# Patient Record
Sex: Female | Born: 1965
Health system: Southern US, Community
[De-identification: ages and names within clinical notes are randomized; demographics above are authoritative.]

## PROBLEM LIST (undated history)

## (undated) DIAGNOSIS — R55 Syncope and collapse: Secondary | ICD-10-CM

## (undated) DIAGNOSIS — M792 Neuralgia and neuritis, unspecified: Secondary | ICD-10-CM

## (undated) DIAGNOSIS — R2689 Other abnormalities of gait and mobility: Secondary | ICD-10-CM

## (undated) DIAGNOSIS — IMO0002 Reserved for concepts with insufficient information to code with codable children: Secondary | ICD-10-CM

## (undated) DIAGNOSIS — I10 Essential (primary) hypertension: Secondary | ICD-10-CM

## (undated) DIAGNOSIS — M1990 Unspecified osteoarthritis, unspecified site: Secondary | ICD-10-CM

## (undated) DIAGNOSIS — H548 Legal blindness, as defined in USA: Secondary | ICD-10-CM

## (undated) DIAGNOSIS — H9209 Otalgia, unspecified ear: Secondary | ICD-10-CM

## (undated) DIAGNOSIS — G360 Neuromyelitis optica [Devic]: Secondary | ICD-10-CM

## (undated) DIAGNOSIS — Z8673 Personal history of transient ischemic attack (TIA), and cerebral infarction without residual deficits: Secondary | ICD-10-CM

## (undated) DIAGNOSIS — F32A Depression, unspecified: Secondary | ICD-10-CM

## (undated) DIAGNOSIS — K219 Gastro-esophageal reflux disease without esophagitis: Secondary | ICD-10-CM

## (undated) DIAGNOSIS — D638 Anemia in other chronic diseases classified elsewhere: Secondary | ICD-10-CM

## (undated) DIAGNOSIS — M329 Systemic lupus erythematosus, unspecified: Secondary | ICD-10-CM

## (undated) DIAGNOSIS — R270 Ataxia, unspecified: Secondary | ICD-10-CM

## (undated) DIAGNOSIS — G894 Chronic pain syndrome: Secondary | ICD-10-CM

## (undated) DIAGNOSIS — F418 Other specified anxiety disorders: Secondary | ICD-10-CM

## (undated) DIAGNOSIS — Z8619 Personal history of other infectious and parasitic diseases: Secondary | ICD-10-CM

## (undated) DIAGNOSIS — Z87442 Personal history of urinary calculi: Secondary | ICD-10-CM

## (undated) DIAGNOSIS — R0602 Shortness of breath: Secondary | ICD-10-CM

## (undated) DIAGNOSIS — R768 Other specified abnormal immunological findings in serum: Secondary | ICD-10-CM

## (undated) DIAGNOSIS — R51 Headache: Secondary | ICD-10-CM

## (undated) DIAGNOSIS — M3219 Other organ or system involvement in systemic lupus erythematosus: Secondary | ICD-10-CM

## (undated) DIAGNOSIS — R0609 Other forms of dyspnea: Secondary | ICD-10-CM

## (undated) DIAGNOSIS — H6980 Other specified disorders of Eustachian tube, unspecified ear: Secondary | ICD-10-CM

## (undated) DIAGNOSIS — J9611 Chronic respiratory failure with hypoxia: Secondary | ICD-10-CM

## (undated) DIAGNOSIS — D6862 Lupus anticoagulant syndrome: Secondary | ICD-10-CM

## (undated) DIAGNOSIS — Z9225 Personal history of immunosupression therapy: Secondary | ICD-10-CM

## (undated) DIAGNOSIS — H547 Unspecified visual loss: Secondary | ICD-10-CM

## (undated) DIAGNOSIS — R42 Dizziness and giddiness: Secondary | ICD-10-CM

## (undated) DIAGNOSIS — F419 Anxiety disorder, unspecified: Secondary | ICD-10-CM

## (undated) DIAGNOSIS — L989 Disorder of the skin and subcutaneous tissue, unspecified: Secondary | ICD-10-CM

## (undated) DIAGNOSIS — L819 Disorder of pigmentation, unspecified: Secondary | ICD-10-CM

## (undated) DIAGNOSIS — I639 Cerebral infarction, unspecified: Secondary | ICD-10-CM

## (undated) DIAGNOSIS — E871 Hypo-osmolality and hyponatremia: Secondary | ICD-10-CM

## (undated) DIAGNOSIS — R531 Weakness: Secondary | ICD-10-CM

## (undated) DIAGNOSIS — R208 Other disturbances of skin sensation: Secondary | ICD-10-CM

## (undated) DIAGNOSIS — G8929 Other chronic pain: Secondary | ICD-10-CM

## (undated) DIAGNOSIS — Z7952 Long term (current) use of systemic steroids: Secondary | ICD-10-CM

## (undated) DIAGNOSIS — F329 Major depressive disorder, single episode, unspecified: Secondary | ICD-10-CM

## (undated) DIAGNOSIS — H6983 Other specified disorders of Eustachian tube, bilateral: Secondary | ICD-10-CM

## (undated) HISTORY — DX: Dizziness and giddiness: R42

## (undated) HISTORY — DX: Legal blindness, as defined in USA: H54.8

## (undated) HISTORY — DX: Weakness: R53.1

## (undated) HISTORY — DX: Chronic respiratory failure with hypoxia: J96.11

## (undated) HISTORY — PX: PORTA CATH INSERTION: CATH118285

## (undated) HISTORY — DX: Personal history of immunosupression therapy: Z92.25

## (undated) HISTORY — DX: Lupus anticoagulant syndrome: D68.62

## (undated) HISTORY — DX: Syncope and collapse: R55

## (undated) HISTORY — DX: Anemia in other chronic diseases classified elsewhere: D63.8

## (undated) HISTORY — DX: Neuralgia and neuritis, unspecified: M79.2

## (undated) HISTORY — PX: TUBAL LIGATION: SHX77

## (undated) HISTORY — DX: Disorder of the skin and subcutaneous tissue, unspecified: L98.9

## (undated) HISTORY — DX: Other organ or system involvement in systemic lupus erythematosus: M32.19

## (undated) HISTORY — DX: Other abnormalities of gait and mobility: R26.89

## (undated) HISTORY — DX: Personal history of transient ischemic attack (TIA), and cerebral infarction without residual deficits: Z86.73

## (undated) HISTORY — PX: BREAST BIOPSY: SHX20

## (undated) HISTORY — DX: Long term (current) use of systemic steroids: Z79.52

## (undated) HISTORY — DX: Other specified disorders of eustachian tube, bilateral: H69.83

## (undated) HISTORY — DX: Neuromyelitis optica (devic): G36.0

## (undated) HISTORY — DX: Other chronic pain: G89.29

## (undated) HISTORY — DX: Disorder of pigmentation, unspecified: L81.9

## (undated) HISTORY — PX: BREAST CYST EXCISION: SHX579

## (undated) HISTORY — DX: Other disturbances of skin sensation: R20.8

## (undated) HISTORY — DX: Systemic lupus erythematosus, unspecified: M32.9

## (undated) HISTORY — DX: Hypo-osmolality and hyponatremia: E87.1

## (undated) HISTORY — DX: Chronic pain syndrome: G89.4

## (undated) HISTORY — DX: Otalgia, unspecified ear: H92.09

## (undated) HISTORY — DX: Ataxia, unspecified: R27.0

## (undated) HISTORY — DX: Personal history of other infectious and parasitic diseases: Z86.19

## (undated) HISTORY — DX: Other specified anxiety disorders: F41.8

## (undated) HISTORY — DX: Anxiety disorder, unspecified: F41.9

## (undated) HISTORY — DX: Other forms of dyspnea: R06.09

## (undated) HISTORY — DX: Other specified disorders of Eustachian tube, unspecified ear: H69.80

---
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
## 1997-12-10 ENCOUNTER — Emergency Department (HOSPITAL_COMMUNITY): Admission: EM | Admit: 1997-12-10 | Discharge: 1997-12-10 | Payer: Self-pay | Admitting: Emergency Medicine

## 2004-12-19 ENCOUNTER — Emergency Department: Payer: Self-pay | Admitting: Emergency Medicine

## 2005-09-25 ENCOUNTER — Emergency Department: Payer: Self-pay | Admitting: Internal Medicine

## 2005-09-25 ENCOUNTER — Other Ambulatory Visit: Payer: Self-pay

## 2006-03-31 ENCOUNTER — Emergency Department: Payer: Self-pay | Admitting: Emergency Medicine

## 2006-09-10 DIAGNOSIS — I639 Cerebral infarction, unspecified: Secondary | ICD-10-CM

## 2006-09-10 HISTORY — DX: Cerebral infarction, unspecified: I63.9

## 2006-12-22 ENCOUNTER — Inpatient Hospital Stay: Payer: Self-pay | Admitting: Internal Medicine

## 2008-04-04 ENCOUNTER — Other Ambulatory Visit: Payer: Self-pay

## 2008-04-04 ENCOUNTER — Emergency Department: Payer: Self-pay | Admitting: Emergency Medicine

## 2008-08-12 ENCOUNTER — Ambulatory Visit: Payer: Self-pay | Admitting: Internal Medicine

## 2008-10-19 ENCOUNTER — Ambulatory Visit: Payer: Self-pay | Admitting: Urology

## 2008-11-09 ENCOUNTER — Ambulatory Visit: Payer: Self-pay | Admitting: Urology

## 2009-03-28 ENCOUNTER — Inpatient Hospital Stay: Payer: Self-pay | Admitting: Internal Medicine

## 2009-11-24 ENCOUNTER — Ambulatory Visit: Payer: Self-pay | Admitting: Gastroenterology

## 2009-12-07 ENCOUNTER — Ambulatory Visit: Payer: Self-pay | Admitting: Internal Medicine

## 2009-12-23 ENCOUNTER — Ambulatory Visit: Payer: Self-pay | Admitting: Internal Medicine

## 2010-07-27 ENCOUNTER — Emergency Department (HOSPITAL_COMMUNITY): Admission: EM | Admit: 2010-07-27 | Discharge: 2010-07-27 | Payer: Self-pay | Admitting: Emergency Medicine

## 2010-08-19 ENCOUNTER — Emergency Department: Payer: Self-pay | Admitting: Emergency Medicine

## 2010-11-21 LAB — URINALYSIS, ROUTINE W REFLEX MICROSCOPIC
Glucose, UA: NEGATIVE mg/dL
Leukocytes, UA: NEGATIVE
Specific Gravity, Urine: 1.005 (ref 1.005–1.030)
pH: 6.5 (ref 5.0–8.0)

## 2010-11-21 LAB — URINE MICROSCOPIC-ADD ON

## 2010-11-21 LAB — URINE CULTURE: Culture: NO GROWTH

## 2011-05-22 ENCOUNTER — Ambulatory Visit: Payer: Self-pay | Admitting: Internal Medicine

## 2012-04-29 ENCOUNTER — Emergency Department (INDEPENDENT_AMBULATORY_CARE_PROVIDER_SITE_OTHER): Payer: Medicare Other

## 2012-04-29 ENCOUNTER — Emergency Department (INDEPENDENT_AMBULATORY_CARE_PROVIDER_SITE_OTHER)
Admission: EM | Admit: 2012-04-29 | Discharge: 2012-04-29 | Disposition: A | Discharge status: Home / Self Care | Payer: Medicare Other | Source: Home / Self Care | Attending: Family Medicine | Admitting: Family Medicine

## 2012-04-29 ENCOUNTER — Encounter (HOSPITAL_COMMUNITY): Payer: Self-pay | Admitting: *Deleted

## 2012-04-29 DIAGNOSIS — A084 Viral intestinal infection, unspecified: Secondary | ICD-10-CM

## 2012-04-29 DIAGNOSIS — A088 Other specified intestinal infections: Secondary | ICD-10-CM

## 2012-04-29 HISTORY — DX: Essential (primary) hypertension: I10

## 2012-04-29 HISTORY — DX: Unspecified visual loss: H54.7

## 2012-04-29 HISTORY — DX: Systemic lupus erythematosus, unspecified: M32.9

## 2012-04-29 HISTORY — DX: Reserved for concepts with insufficient information to code with codable children: IMO0002

## 2012-04-29 LAB — POCT URINALYSIS DIP (DEVICE)
Ketones, ur: NEGATIVE mg/dL
Protein, ur: 30 mg/dL — AB
Specific Gravity, Urine: 1.015 (ref 1.005–1.030)

## 2012-04-29 LAB — POCT I-STAT, CHEM 8
Chloride: 99 mEq/L (ref 96–112)
Glucose, Bld: 88 mg/dL (ref 70–99)
HCT: 37 % (ref 36.0–46.0)
Potassium: 3.5 mEq/L (ref 3.5–5.1)

## 2012-04-29 MED ORDER — SODIUM CHLORIDE 0.9 % IV SOLN
Freq: Once | INTRAVENOUS | Status: AC
  Administered 2012-04-29 (×2): via INTRAVENOUS

## 2012-04-29 MED ORDER — ONDANSETRON HCL 4 MG/2ML IJ SOLN
INTRAMUSCULAR | Status: AC
  Filled 2012-04-29: qty 2

## 2012-04-29 MED ORDER — ONDANSETRON HCL 4 MG PO TABS: 4.0000 mg | ORAL_TABLET | Freq: Four times a day (QID) | ORAL | Status: AC

## 2012-04-29 MED ORDER — ONDANSETRON HCL 4 MG/2ML IJ SOLN
4.0000 mg | Freq: Once | INTRAMUSCULAR | Status: AC
  Administered 2012-04-29: 4 mg via INTRAVENOUS

## 2012-04-29 NOTE — ED Notes (Signed)
Up to BR with help, pt voided in the toilet, states the nausea is gone and she has had no diarrhea since she has been here

## 2012-04-29 NOTE — ED Provider Notes (Signed)
History     CSN: [id number]  Arrival date & time 04/29/12  1114   First MD Initiated Contact with Patient 04/29/12 1125      Chief Complaint  Patient presents with  . Diarrhea    (Consider location/radiation/quality/duration/timing/severity/associated sxs/prior treatment) Patient is a 46 y.o. female presenting with vomiting. The history is provided by the patient.  Emesis  This is a new problem. The current episode started yesterday (onset 2-3 d ago with cough and cold sx, resolved but n/v/d started yest and headache since last eve.). The problem occurs 2 to 4 times per day. The problem has been gradually improving. The emesis has an appearance of stomach contents. Associated symptoms include chills, cough, diarrhea and headaches. Pertinent negatives include no abdominal pain and no fever. Risk factors: no one else ill , no travel.    Past Medical History  Diagnosis Date  . Hypertension   . Blind   . Lupus     History reviewed. No pertinent past surgical history.  Family History  Problem Relation Age of Onset  . Cancer Mother   . Cancer Father     History  Substance Use Topics  . Smoking status: Never Smoker   . Smokeless tobacco: Not on file  . Alcohol Use: No    OB History    Grav Para Term Preterm Abortions TAB SAB Ect Mult Living                  Review of Systems  Constitutional: Positive for chills. Negative for fever.  Respiratory: Positive for cough. Negative for shortness of breath.   Cardiovascular: Negative.   Gastrointestinal: Positive for nausea, vomiting and diarrhea. Negative for abdominal pain.  Genitourinary: Negative.   Neurological: Positive for headaches.    Allergies  Review of patient's allergies indicates no known allergies.  Home Medications   Current Outpatient Rx  Name Route Sig Dispense Refill  . METOPROLOL SUCCINATE ER 25 MG PO TB24 Oral Take 25 mg by mouth daily.    Marland Kitchen ONDANSETRON HCL 4 MG PO TABS Oral Take 1 tablet (4 mg  total) by mouth every 6 (six) hours. As needed for n/v 6 tablet 0  . PREDNISONE 10 MG PO TABS Oral Take 10 mg by mouth daily.      BP 111/51  Pulse 110  Temp 99.5 F (37.5 C) (Oral)  Resp 20  SpO2 100%  Physical Exam  Nursing note and vitals reviewed. Constitutional: She is oriented to person, place, and time. She appears well-developed and well-nourished. She appears distressed.  HENT:  Head: Normocephalic.  Right Ear: External ear normal.  Left Ear: External ear normal.  Mouth/Throat: Mucous membranes are dry.  Eyes: Conjunctivae are normal. Pupils are equal, round, and reactive to light.  Neck: Normal range of motion. Neck supple.  Cardiovascular: Normal heart sounds and intact distal pulses.  Tachycardia present.   Pulmonary/Chest: Breath sounds normal.  Abdominal: Soft. Bowel sounds are normal. She exhibits no mass. There is no tenderness. There is no rebound and no guarding.  Lymphadenopathy:    She has no cervical adenopathy.  Neurological: She is alert and oriented to person, place, and time.  Skin: Skin is warm and dry.    ED Course  Procedures (including critical care time)  Labs Reviewed  POCT I-STAT, CHEM 8 - Abnormal; Notable for the following:    Sodium 134 (*)     Calcium, Ion 1.08 (*)     All other components within normal  limits  POCT URINALYSIS DIP (DEVICE) - Abnormal; Notable for the following:    Hgb urine dipstick TRACE (*)     Protein, ur 30 (*)     Leukocytes, UA TRACE (*)  Biochemical Testing Only. Please order routine urinalysis from main lab if confirmatory testing is needed.   All other components within normal limits   Dg Chest 2 View  04/29/2012  *RADIOLOGY REPORT*  Clinical Data: Cough, fever.  CHEST - 2 VIEW  Comparison: None  Findings: Mild cardiomegaly.  Minimal lingular opacity.  Suspect atelectasis or scarring although early infiltrate cannot be excluded.  Right lung is clear.  Mild peribronchial thickening.  No effusions or acute bony  abnormality.  IMPRESSION: Cardiomegaly, bronchitic changes.  Lingular density, atelectasis or scarring versus early infiltrate.   Original Report Authenticated By: Cyndie Chime, M.D.      1. Gastroenteritis and colitis, viral       MDM  Sx improved after ivf and med.,urinating well., desires d/c        Linna Hoff, MD 04/29/12 [phone]

## 2012-04-29 NOTE — ED Notes (Signed)
Pt c/o frontal h/a .  Notified Dr Artis Flock, he said when she is fully hydrated the pain will subside

## 2012-04-29 NOTE — ED Notes (Signed)
3 days of N, V and mainly D--many times, the last watery BM was 0500.  Also fever and myalgias  She is taking fluids well, but not eating solid food much

## 2012-05-04 ENCOUNTER — Emergency Department (HOSPITAL_COMMUNITY)
Admission: EM | Admit: 2012-05-04 | Discharge: 2012-05-04 | Disposition: A | Discharge status: Home / Self Care | Payer: Medicare Other | Attending: Emergency Medicine | Admitting: Emergency Medicine

## 2012-05-04 ENCOUNTER — Emergency Department (HOSPITAL_COMMUNITY): Payer: Medicare Other

## 2012-05-04 DIAGNOSIS — R197 Diarrhea, unspecified: Secondary | ICD-10-CM | POA: Insufficient documentation

## 2012-05-04 DIAGNOSIS — R1110 Vomiting, unspecified: Secondary | ICD-10-CM

## 2012-05-04 DIAGNOSIS — K8020 Calculus of gallbladder without cholecystitis without obstruction: Secondary | ICD-10-CM | POA: Insufficient documentation

## 2012-05-04 DIAGNOSIS — R5381 Other malaise: Secondary | ICD-10-CM | POA: Insufficient documentation

## 2012-05-04 DIAGNOSIS — R112 Nausea with vomiting, unspecified: Secondary | ICD-10-CM | POA: Insufficient documentation

## 2012-05-04 DIAGNOSIS — R5383 Other fatigue: Secondary | ICD-10-CM

## 2012-05-04 DIAGNOSIS — B349 Viral infection, unspecified: Secondary | ICD-10-CM

## 2012-05-04 DIAGNOSIS — R51 Headache: Secondary | ICD-10-CM | POA: Insufficient documentation

## 2012-05-04 LAB — COMPREHENSIVE METABOLIC PANEL
BUN: 10 mg/dL (ref 6–23)
CO2: 24 mEq/L (ref 19–32)
Chloride: 101 mEq/L (ref 96–112)
Creatinine, Ser: 0.89 mg/dL (ref 0.50–1.10)
GFR calc Af Amer: >90 mL/min (ref >90)
GFR calc non Af Amer: 78 mL/min — ABNORMAL LOW (ref >90)
Glucose, Bld: 78 mg/dL (ref 70–99)
Total Bilirubin: 0.5 mg/dL (ref 0.3–1.2)

## 2012-05-04 LAB — CBC
HCT: 37.3 % (ref 36.0–46.0)
Hemoglobin: 11.8 g/dL — ABNORMAL LOW (ref 12.0–15.0)
MCV: 71.9 fL — ABNORMAL LOW (ref 78.0–100.0)
RBC: 5.19 MIL/uL — ABNORMAL HIGH (ref 3.87–5.11)
RDW: 14.4 % (ref 11.5–15.5)
WBC: 2.8 10*3/uL — ABNORMAL LOW (ref 4.0–10.5)

## 2012-05-04 LAB — URINALYSIS, ROUTINE W REFLEX MICROSCOPIC
Ketones, ur: NEGATIVE mg/dL
Protein, ur: NEGATIVE mg/dL
Urobilinogen, UA: 0.2 mg/dL (ref 0.0–1.0)

## 2012-05-04 LAB — URINE MICROSCOPIC-ADD ON

## 2012-05-04 LAB — PREGNANCY, URINE: Preg Test, Ur: NEGATIVE

## 2012-05-04 MED ORDER — SODIUM CHLORIDE 0.9 % IV BOLUS (SEPSIS)
1000.0000 mL | Freq: Once | INTRAVENOUS | Status: AC
  Administered 2012-05-04: 1000 mL via INTRAVENOUS

## 2012-05-04 MED ORDER — ONDANSETRON HCL 4 MG/2ML IJ SOLN
4.0000 mg | INTRAMUSCULAR | Status: AC
  Administered 2012-05-04: 4 mg via INTRAVENOUS
  Filled 2012-05-04: qty 2

## 2012-05-04 NOTE — ED Notes (Signed)
Pt alert, arrives from home, c/o n/v/d, onset was a week ago, seen in Urgent Care, returns to Ed with cont Complaints, resp even unlabored, skin pwd

## 2012-05-04 NOTE — ED Notes (Signed)
Pt c/o N/V/D and headache.No LOC.VSS.pwd

## 2012-05-04 NOTE — ED Provider Notes (Signed)
History     CSN: [id number]  Arrival date & time 05/04/12  4098   First MD Initiated Contact with Patient 05/04/12 [phone]      Chief Complaint  Patient presents with  . Nausea  . Emesis  . Diarrhea    (Consider location/radiation/quality/duration/timing/severity/associated sxs/prior treatment) HPI Comments: Ms. Spelman presents for evaluation of nausea.  She states she has been ill for 8 days.  Her symptoms have included weakness, nausea, vomiting, diarrhea and fatigue.  She was prescribed zofran, cipro, and flagyl for this condition and states that the diarrhea has improved but she continues to feel ill and thinks that she should be feeling better by now.  She denies any fevers or pain.  Patient is a 46 y.o. female presenting with vomiting and diarrhea. The history is provided by the patient. No language interpreter was used.  Emesis  This is a new problem. The current episode started more than 1 week ago. The problem has been gradually improving. The emesis has an appearance of stomach contents. There has been no fever. Associated symptoms include diarrhea and headaches. Pertinent negatives include no abdominal pain, no arthralgias, no chills, no cough, no fever, no myalgias, no sweats and no URI.  Diarrhea The primary symptoms include fatigue, nausea, vomiting and diarrhea. Primary symptoms do not include fever, abdominal pain, dysuria, myalgias or arthralgias.  The illness does not include chills, constipation or back pain.    Past Medical History  Diagnosis Date  . Hypertension   . Blind   . Lupus     No past surgical history on file.  Family History  Problem Relation Age of Onset  . Cancer Mother   . Cancer Father     History  Substance Use Topics  . Smoking status: Never Smoker   . Smokeless tobacco: Not on file  . Alcohol Use: No    OB History    Grav Para Term Preterm Abortions TAB SAB Ect Mult Living                  Review of Systems  Constitutional:  Positive for activity change, appetite change and fatigue. Negative for fever, chills, diaphoresis and unexpected weight change.  HENT: Negative for congestion, facial swelling, rhinorrhea and voice change.   Eyes: Negative.   Respiratory: Negative for cough and chest tightness.   Cardiovascular: Negative.   Gastrointestinal: Positive for nausea, vomiting and diarrhea. Negative for abdominal pain, constipation, blood in stool, abdominal distention and anal bleeding.  Genitourinary: Negative for dysuria, urgency, flank pain, decreased urine volume and enuresis.  Musculoskeletal: Negative for myalgias, back pain, joint swelling and arthralgias.  Neurological: Positive for weakness and headaches. Negative for dizziness, syncope and light-headedness.  Hematological: Negative.   Psychiatric/Behavioral: Negative.     Allergies  Review of patient's allergies indicates no known allergies.  Home Medications   Current Outpatient Rx  Name Route Sig Dispense Refill  . CIPROFLOXACIN HCL 500 MG PO TABS Oral Take 500 mg by mouth 2 (two) times daily.    Marland Kitchen CITALOPRAM HYDROBROMIDE 40 MG PO TABS Oral Take 40 mg by mouth daily.    . MELOXICAM 7.5 MG PO TABS Oral Take 7.5 mg by mouth daily.    Marland Kitchen METOPROLOL SUCCINATE ER 25 MG PO TB24 Oral Take 25 mg by mouth daily.    Marland Kitchen METRONIDAZOLE 500 MG PO TABS Oral Take 500 mg by mouth 2 (two) times daily.    Marland Kitchen ONDANSETRON HCL 4 MG PO TABS Oral Take  1 tablet (4 mg total) by mouth every 6 (six) hours. As needed for n/v 6 tablet 0  . PREDNISONE 10 MG PO TABS Oral Take 10 mg by mouth daily.    Marland Kitchen ZOLPIDEM TARTRATE 10 MG PO TABS Oral Take 10 mg by mouth at bedtime as needed. sleep      BP 105/50  Pulse 89  Temp 98.6 F (37 C) (Oral)  Resp 16  SpO2 100%  Physical Exam  Nursing note and vitals reviewed. Constitutional: She is oriented to person, place, and time. Vital signs are normal. She appears well-developed and well-nourished.  Non-toxic appearance. She does not  have a sickly appearance. She does not appear ill. No distress. She is not intubated.  HENT:  Head: Normocephalic and atraumatic. Head is without raccoon's eyes and without Battle's sign. No trismus in the jaw.  Right Ear: External ear normal. No tenderness. No mastoid tenderness. Tympanic membrane is not bulging. No hemotympanum.  Left Ear: External ear normal. No tenderness. No mastoid tenderness. Tympanic membrane is not bulging. No hemotympanum.  Nose: Nose normal. No mucosal edema, rhinorrhea or nasal septal hematoma. No epistaxis.  Mouth/Throat: Uvula is midline, oropharynx is clear and moist and mucous membranes are normal. Mucous membranes are not pale, not dry and not cyanotic. No dental abscesses or uvula swelling. No oropharyngeal exudate, posterior oropharyngeal edema, posterior oropharyngeal erythema or tonsillar abscesses.  Eyes: Conjunctivae and EOM are normal. Pupils are equal, round, and reactive to light. Right eye exhibits no discharge. Left eye exhibits no discharge. No scleral icterus.  Neck: Normal range of motion. No JVD present. No tracheal deviation present. No thyromegaly present.  Cardiovascular: Normal rate, regular rhythm, S1 normal, S2 normal, normal heart sounds and intact distal pulses.  Exam reveals no gallop, no distant heart sounds and no friction rub.   No murmur heard. Pulmonary/Chest: Breath sounds normal. No accessory muscle usage or stridor. No apnea, not tachypneic and not bradypneic. She is not intubated. No respiratory distress. She has no wheezes. She has no rales. She exhibits no tenderness and no bony tenderness.  Abdominal: Soft. Bowel sounds are normal. She exhibits no distension and no mass. There is no hepatosplenomegaly. There is no tenderness. There is no rebound, no guarding and no CVA tenderness. No hernia.  Musculoskeletal: Normal range of motion. She exhibits no edema and no tenderness.  Lymphadenopathy:    She has no cervical adenopathy.    Neurological: She is alert and oriented to person, place, and time.  Skin: Skin is warm and dry. No rash noted. She is not diaphoretic. No erythema. No pallor.  Psychiatric: She has a normal mood and affect. Her behavior is normal. Judgment and thought content normal.    ED Course  Procedures (including critical care time)   Labs Reviewed  SPECIMEN HOLD   No results found.   No diagnosis found.    MDM  Pt presents for evaluation of nausea and vomiting.  Pt has been seen at urgent care and by her PMD for this issue and states that the diarrhea has improved but she has residual nausea.  She denies significant pain or fevers.  Plan symptomatic care and screening labs.    1600.  Note mild elevations in AST/ALT.  Obtained RUQ U/S secondary to mild discomfort on repeat exam and her complaints of nausea and vomiting.  There was sludge and small stones as well as some dilatation of the CBD.  No wall thickening or pericholecystic fluid identified.  On  f/u exam pt has no evidence cholecystitis and she had previous imaging that showed CBD dilatation.  Pt has tolerated po fluids and solids.  Plan d/c home.  Encouraged f/u within 3 days with her PMD and also for her to establish care with a local general surgeon.     Tobin Chad, MD 05/04/12 [phone]

## 2012-05-04 NOTE — ED Notes (Signed)
[id number]<AV> Expected date:<BR> Expected time:<BR> Means of arrival:<BR> Comments:<BR> Rm 19, [place] 34, F, N/V/D

## 2012-06-16 ENCOUNTER — Other Ambulatory Visit: Payer: Self-pay | Admitting: Family Medicine

## 2012-06-16 ENCOUNTER — Ambulatory Visit
Admission: RE | Admit: 2012-06-16 | Discharge: 2012-06-16 | Disposition: A | Discharge status: Home / Self Care | Payer: Worker's Compensation | Source: Ambulatory Visit | Attending: Family Medicine | Admitting: Family Medicine

## 2012-06-16 DIAGNOSIS — T1490XA Injury, unspecified, initial encounter: Secondary | ICD-10-CM

## 2012-08-29 ENCOUNTER — Ambulatory Visit: Payer: Self-pay | Admitting: Emergency Medicine

## 2013-01-05 ENCOUNTER — Other Ambulatory Visit: Payer: Self-pay | Admitting: Family Medicine

## 2013-01-05 ENCOUNTER — Other Ambulatory Visit: Payer: Self-pay | Admitting: Registered Nurse

## 2013-01-05 DIAGNOSIS — Z1231 Encounter for screening mammogram for malignant neoplasm of breast: Secondary | ICD-10-CM

## 2013-01-13 ENCOUNTER — Ambulatory Visit: Payer: Worker's Compensation

## 2013-01-30 ENCOUNTER — Ambulatory Visit: Payer: Worker's Compensation

## 2013-01-30 ENCOUNTER — Ambulatory Visit
Admission: RE | Admit: 2013-01-30 | Discharge: 2013-01-30 | Disposition: A | Discharge status: Home / Self Care | Payer: Medicare Other | Source: Ambulatory Visit | Attending: Registered Nurse | Admitting: Registered Nurse

## 2013-01-30 ENCOUNTER — Other Ambulatory Visit: Payer: Self-pay | Admitting: Internal Medicine

## 2013-01-30 DIAGNOSIS — Z1231 Encounter for screening mammogram for malignant neoplasm of breast: Secondary | ICD-10-CM

## 2013-03-04 ENCOUNTER — Emergency Department (HOSPITAL_COMMUNITY)
Admission: EM | Admit: 2013-03-04 | Discharge: 2013-03-04 | Discharge status: Against Medical Advice | Payer: Medicare Other | Attending: Emergency Medicine | Admitting: Emergency Medicine

## 2013-03-04 ENCOUNTER — Encounter (HOSPITAL_COMMUNITY): Payer: Self-pay | Admitting: *Deleted

## 2013-03-04 DIAGNOSIS — I10 Essential (primary) hypertension: Secondary | ICD-10-CM | POA: Insufficient documentation

## 2013-03-04 DIAGNOSIS — J029 Acute pharyngitis, unspecified: Secondary | ICD-10-CM | POA: Insufficient documentation

## 2013-03-04 NOTE — ED Notes (Signed)
Pt called multiple  Times to take back to a room without response

## 2013-03-04 NOTE — ED Notes (Signed)
Pt in stating she feels like she has a lump in her throat, states it has been going on x1 week, symptoms are worse at night, states she wouldn't describe this as pain but if she turned her head to the left side she feels like something is obstructer her airway. Pt is alert and oriented, speaking in full sentences without difficulty.

## 2013-03-20 ENCOUNTER — Encounter: Payer: Self-pay | Admitting: Endocrinology

## 2013-03-20 ENCOUNTER — Ambulatory Visit (INDEPENDENT_AMBULATORY_CARE_PROVIDER_SITE_OTHER): Payer: Medicare Other | Admitting: Endocrinology

## 2013-03-20 VITALS — BP 116/72 | HR 67 | Ht 61.0 in | Wt 164.6 lb

## 2013-03-20 DIAGNOSIS — E042 Nontoxic multinodular goiter: Secondary | ICD-10-CM

## 2013-03-20 DIAGNOSIS — K219 Gastro-esophageal reflux disease without esophagitis: Secondary | ICD-10-CM

## 2013-03-20 HISTORY — DX: Gastro-esophageal reflux disease without esophagitis: K21.9

## 2013-03-20 LAB — T4, FREE: Free T4: 0.66 ng/dL (ref 0.60–1.60)

## 2013-03-20 NOTE — Progress Notes (Signed)
Subjective:     Patient ID: Andrea Berry, female   DOB: [birth year], 47 y.o.   MRN: [id number]  HPI  The patient is referred here for evaluation of her thyroid. She has been complaining of a sensation of lump in her left neck area for the last 3 weeks. This is somewhat on and off and is somewhat uncomfortable. She also feels that when she lies down she feels her air being cut off with a choking sensation. She does not have any difficulty swallowing and the symptoms are not every night, only occasionally will have symptoms during the day. She has not noticed any swelling in her neck. She was sent by her PCP to have an ultrasound of her thyroid which showed a single nodule in each lobe measuring 5 or 6 mm and no overall thyroid enlargement seen last month  Past Medical History  Diagnosis Date  . Hypertension   . Blind   . Lupus     No past surgical history on file.  History   Social History  . Marital Status: Single    Spouse Name: N/A    Number of Children: N/A  . Years of Education: N/A   Occupational History  . Not on file.   Social History Main Topics  . Smoking status: Never Smoker   . Smokeless tobacco: Not on file  . Alcohol Use: No  . Drug Use: No  . Sexually Active:    Other Topics Concern  . Not on file   Social History Narrative  . No narrative on file    Current Outpatient Prescriptions on File Prior to Visit  Medication Sig Dispense Refill  . citalopram (CELEXA) 40 MG tablet Take 40 mg by mouth daily.      . meloxicam (MOBIC) 7.5 MG tablet Take 7.5 mg by mouth daily.      . metoprolol succinate (TOPROL-XL) 25 MG 24 hr tablet Take 25 mg by mouth daily.      . metroNIDAZOLE (FLAGYL) 500 MG tablet Take 500 mg by mouth 2 (two) times daily.      . predniSONE (DELTASONE) 10 MG tablet Take 10 mg by mouth daily.      Marland Kitchen zolpidem (AMBIEN) 10 MG tablet Take 10 mg by mouth at bedtime as needed. sleep      . ciprofloxacin (CIPRO) 500 MG tablet Take 500 mg by mouth 2  (two) times daily.       No current facility-administered medications on file prior to visit.    No Known Allergies  Family History  Problem Relation Age of Onset  . Cancer Mother   . Cancer Father    Review of Systems  Gastrointestinal:       She gets heartburn about 3-4 days a week, has had the symptoms for a long time. She thinks symptoms are controlled with taking Prilosec although she is unclear if she is taking this  she has known lupus and is on prednisone for this, this presented with joint pain No history of hypertension Has history of insomnia History of depression     Objective:   Physical Exam  Constitutional: She appears well-developed and well-nourished.  Neck: No tracheal deviation present. No thyromegaly present.  Small <1 cm lymph node felt in left midcervical area, slightly tender  Cardiovascular: Normal rate and normal heart sounds.   Pulmonary/Chest: Breath sounds normal. Stridor present. She has no wheezes. She has no rales.  Neurological:  Biceps reflexes are brisk. No tremor present  Skin: Skin is warm and dry.       Assessment:     Clinically insignificant subcentimeter thyroid nodules. Unknown level of thyroid function  Nonspecific symptoms of choking in supine position and discomfort in lower neck area, this may be related to excessive reflux Mildly tender lymph node in the left neck area     Plan:     Check thyroid levels No further intervention or evaluation needed for miniscule thyroid nodules which are likely to be incidental  Suggested to her that she can increase her Prilosec to twice a day and followup with PCP for further management as well as followup of small left cervical lymph node     Lab Results  Component Value Date   TSH 3.55 03/20/2013

## 2013-03-20 NOTE — Patient Instructions (Addendum)
Will call you if thyroid test is abnormal   follow up with primary care physician for further treatment of symptoms

## 2013-12-20 ENCOUNTER — Ambulatory Visit (HOSPITAL_COMMUNITY): Payer: Medicare Other | Attending: Emergency Medicine

## 2013-12-20 ENCOUNTER — Encounter (HOSPITAL_COMMUNITY): Payer: Self-pay | Admitting: Emergency Medicine

## 2013-12-20 ENCOUNTER — Emergency Department (INDEPENDENT_AMBULATORY_CARE_PROVIDER_SITE_OTHER)
Admission: EM | Admit: 2013-12-20 | Discharge: 2013-12-20 | Disposition: A | Discharge status: Home / Self Care | Payer: Medicare Other | Source: Home / Self Care | Attending: Family Medicine | Admitting: Family Medicine

## 2013-12-20 DIAGNOSIS — M7989 Other specified soft tissue disorders: Secondary | ICD-10-CM | POA: Insufficient documentation

## 2013-12-20 DIAGNOSIS — W268XXA Contact with other sharp object(s), not elsewhere classified, initial encounter: Secondary | ICD-10-CM

## 2013-12-20 DIAGNOSIS — S91309A Unspecified open wound, unspecified foot, initial encounter: Secondary | ICD-10-CM | POA: Diagnosis not present

## 2013-12-20 DIAGNOSIS — Y92009 Unspecified place in unspecified non-institutional (private) residence as the place of occurrence of the external cause: Secondary | ICD-10-CM

## 2013-12-20 DIAGNOSIS — S91339A Puncture wound without foreign body, unspecified foot, initial encounter: Secondary | ICD-10-CM

## 2013-12-20 MED ORDER — CEFUROXIME AXETIL 500 MG PO TABS: 500.0000 mg | ORAL_TABLET | Freq: Two times a day (BID) | ORAL | Status: DC

## 2013-12-20 MED ORDER — KETOROLAC TROMETHAMINE 60 MG/2ML IM SOLN
60.0000 mg | Freq: Once | INTRAMUSCULAR | Status: AC
  Administered 2013-12-20: 60 mg via INTRAMUSCULAR

## 2013-12-20 MED ORDER — IBUPROFEN 800 MG PO TABS: 800.0000 mg | ORAL_TABLET | Freq: Once | ORAL | Status: DC

## 2013-12-20 MED ORDER — DOXYCYCLINE HYCLATE 100 MG PO TABS
200.0000 mg | ORAL_TABLET | Freq: Once | ORAL | Status: AC
  Administered 2013-12-20: 200 mg via ORAL

## 2013-12-20 MED ORDER — TRAMADOL HCL 50 MG PO TABS: 50.0000 mg | ORAL_TABLET | Freq: Four times a day (QID) | ORAL | Status: DC | PRN

## 2013-12-20 MED ORDER — CEFIXIME 400 MG PO TABS
400.0000 mg | ORAL_TABLET | Freq: Once | ORAL | Status: AC
  Administered 2013-12-20: 400 mg via ORAL

## 2013-12-20 MED ORDER — DOXYCYCLINE HYCLATE 100 MG PO CAPS: 100.0000 mg | ORAL_CAPSULE | Freq: Two times a day (BID) | ORAL | Status: DC

## 2013-12-20 MED ORDER — KETOROLAC TROMETHAMINE 60 MG/2ML IM SOLN
INTRAMUSCULAR | Status: AC
  Filled 2013-12-20: qty 2

## 2013-12-20 MED ORDER — DOXYCYCLINE HYCLATE 100 MG PO TABS
ORAL_TABLET | ORAL | Status: AC
  Filled 2013-12-20: qty 2

## 2013-12-20 MED ORDER — CEFIXIME 400 MG PO TABS
ORAL_TABLET | ORAL | Status: AC
  Filled 2013-12-20: qty 1

## 2013-12-20 NOTE — ED Notes (Signed)
Patient transported to X-ray 

## 2013-12-20 NOTE — ED Notes (Signed)
C/o left foot injury States on Friday she hit her foot on a tree stump in yard Did soak foot in epsom salt

## 2013-12-20 NOTE — ED Provider Notes (Signed)
Medical screening examination/treatment/procedure(s) were performed by resident physician or non-physician practitioner and as supervising physician I was immediately available for consultation/collaboration.   Pauline Good MD.   Billy Fischer, MD 12/20/13 2045

## 2013-12-20 NOTE — ED Provider Notes (Signed)
CSN: [id number]     Arrival date & time 12/20/13  1005 History   First MD Initiated Contact with Patient 12/20/13 1126     Chief Complaint  Patient presents with  . Foot Injury   (Consider location/radiation/quality/duration/timing/severity/associated sxs/prior Treatment) HPI Comments: 48 year old female presents complaining of left foot injury. She was walking in her yard 2 days ago when she stepped on a stump and somehow a branch poked into the top of her foot. She is unaware of exactly how far it poked into her foot. She had immediate pain that has gotten progressively worse. She also has swelling. The pain is increased with touching the foot or with any ambulation. She denies any systemic symptoms at this time. She has no other injuries. She is on chronic steroids for lupus, 10 mg daily   Patient is a 48 y.o. female presenting with foot injury.  Foot Injury   Past Medical History  Diagnosis Date  . Hypertension   . Blind   . Lupus    History reviewed. No pertinent past surgical history. Family History  Problem Relation Age of Onset  . Cancer Mother   . Cancer Father    History  Substance Use Topics  . Smoking status: Never Smoker   . Smokeless tobacco: Not on file  . Alcohol Use: No   OB History   Grav Para Term Preterm Abortions TAB SAB Ect Mult Living                 Review of Systems  Musculoskeletal: Positive for gait problem.       Foot pain and swelling  Skin: Positive for wound.  All other systems reviewed and are negative.   Allergies  Review of patient's allergies indicates no known allergies.  Home Medications   Current Outpatient Rx  Name  Route  Sig  Dispense  Refill  . azaTHIOprine (IMURAN) 50 MG tablet               . baclofen (LIORESAL) 10 MG tablet   Oral   Take 10 mg by mouth 3 (three) times daily.         . cefUROXime (CEFTIN) 500 MG tablet   Oral   Take 1 tablet (500 mg total) by mouth 2 (two) times daily with a meal.   20  tablet   0   . ciprofloxacin (CIPRO) 500 MG tablet   Oral   Take 500 mg by mouth 2 (two) times daily.         . citalopram (CELEXA) 40 MG tablet   Oral   Take 40 mg by mouth daily.         . cyclobenzaprine (FLEXERIL) 5 MG tablet               . diclofenac sodium (VOLTAREN) 1 % GEL   Topical   Apply topically as needed.         . doxycycline (VIBRAMYCIN) 100 MG capsule   Oral   Take 1 capsule (100 mg total) by mouth 2 (two) times daily.   20 capsule   0   . ibandronate (BONIVA) 150 MG tablet               . LORazepam (ATIVAN) 0.5 MG tablet               . meloxicam (MOBIC) 7.5 MG tablet   Oral   Take 7.5 mg by mouth daily.         Marland Kitchen  metoprolol succinate (TOPROL-XL) 25 MG 24 hr tablet   Oral   Take 25 mg by mouth daily.         . metroNIDAZOLE (FLAGYL) 500 MG tablet   Oral   Take 500 mg by mouth 2 (two) times daily.         Marland Kitchen omeprazole (PRILOSEC) 20 MG capsule               . PARoxetine (PAXIL) 30 MG tablet               . predniSONE (DELTASONE) 10 MG tablet   Oral   Take 10 mg by mouth daily.         . traMADol (ULTRAM) 50 MG tablet   Oral   Take 1 tablet (50 mg total) by mouth every 6 (six) hours as needed.   20 tablet   0   . zolpidem (AMBIEN) 10 MG tablet   Oral   Take 10 mg by mouth at bedtime as needed. sleep          BP 136/79  Pulse 87  Temp(Src) 98.6 F (37 C) (Oral)  Resp 18  SpO2 100% Physical Exam  Nursing note and vitals reviewed. Constitutional: She is oriented to person, place, and time. Vital signs are normal. She appears well-developed and well-nourished. No distress.  HENT:  Head: Normocephalic and atraumatic.  Cardiovascular:  Pulses:      Dorsalis pedis pulses are 2+ on the left side.  Pulmonary/Chest: Effort normal. No respiratory distress.  Musculoskeletal:       Left foot: She exhibits tenderness and swelling.       Feet:  Neurological: She is alert and oriented to person, place,  and time. She has normal strength. No sensory deficit. She exhibits normal muscle tone. Coordination normal.  Skin: Skin is warm and dry. No rash noted. She is not diaphoretic.  Psychiatric: She has a normal mood and affect. Judgment normal.    ED Course  Procedures (including critical care time) Labs Review Labs Reviewed - No data to display Imaging Review Dg Foot Complete Left  12/20/2013   CLINICAL DATA:  Foot injury. Puncture wound from tree branch with swelling near the metatarsophalangeal joints.  EXAM: LEFT FOOT - COMPLETE 3+ VIEW  COMPARISON:  None.  FINDINGS: There is prominent soft tissue swelling over the dorsum of the foot at the level of the metatarsals and proximal phalanges. No radiopaque foreign body or subcutaneous gas. Normal alignment of the bones. No acute or healing fracture identified.  IMPRESSION: Soft tissue swelling of the dorsum of the foot. No acute bony abnormality or radiopaque foreign body.   Electronically Signed   By: Curlene Dolphin M.D.   On: 12/20/2013 12:24     MDM   1. Puncture wound of foot excluding toes with infection    Presumed infection from puncture wound. We'll get x-ray to try to see if there is a foreign body. We'll go ahead and give some Toradol as well as doxycycline and Suprax  XR negative.  Will discharge with doxy and ceftin, with strict instructions to f/u in ED if any worsening despite ABx.   Meds ordered this encounter  Medications  . doxycycline (VIBRA-TABS) tablet 200 mg    Sig:   . cefixime (SUPRAX) tablet 400 mg    Sig:   . DISCONTD: ibuprofen (ADVIL,MOTRIN) tablet 800 mg    Sig:   . ketorolac (TORADOL) injection 60 mg    Sig:   .  doxycycline (VIBRAMYCIN) 100 MG capsule    Sig: Take 1 capsule (100 mg total) by mouth 2 (two) times daily.    Dispense:  20 capsule    Refill:  0    Order Specific Question:  Supervising Provider    Answer:  Billy Fischer [phone]  . cefUROXime (CEFTIN) 500 MG tablet    Sig: Take 1 tablet (500  mg total) by mouth 2 (two) times daily with a meal.    Dispense:  20 tablet    Refill:  0    Order Specific Question:  Supervising Provider    Answer:  Billy Fischer [phone]  . traMADol (ULTRAM) 50 MG tablet    Sig: Take 1 tablet (50 mg total) by mouth every 6 (six) hours as needed.    Dispense:  20 tablet    Refill:  0    Order Specific Question:  Supervising Provider    Answer:  Ihor Gully D [place], PA-C 12/20/13 1241

## 2014-01-07 ENCOUNTER — Encounter: Payer: Self-pay | Admitting: Advanced Practice Midwife

## 2014-01-11 ENCOUNTER — Emergency Department (HOSPITAL_COMMUNITY)
Admission: EM | Admit: 2014-01-11 | Discharge: 2014-01-11 | Disposition: A | Discharge status: Home / Self Care | Payer: Medicare Other | Attending: Emergency Medicine | Admitting: Emergency Medicine

## 2014-01-11 ENCOUNTER — Encounter (HOSPITAL_COMMUNITY): Payer: Self-pay | Admitting: Emergency Medicine

## 2014-01-11 DIAGNOSIS — H543 Unqualified visual loss, both eyes: Secondary | ICD-10-CM | POA: Diagnosis not present

## 2014-01-11 DIAGNOSIS — R319 Hematuria, unspecified: Secondary | ICD-10-CM

## 2014-01-11 DIAGNOSIS — R1110 Vomiting, unspecified: Secondary | ICD-10-CM | POA: Diagnosis not present

## 2014-01-11 DIAGNOSIS — IMO0002 Reserved for concepts with insufficient information to code with codable children: Secondary | ICD-10-CM | POA: Insufficient documentation

## 2014-01-11 DIAGNOSIS — R197 Diarrhea, unspecified: Secondary | ICD-10-CM | POA: Diagnosis not present

## 2014-01-11 DIAGNOSIS — R339 Retention of urine, unspecified: Secondary | ICD-10-CM | POA: Diagnosis present

## 2014-01-11 DIAGNOSIS — Z79899 Other long term (current) drug therapy: Secondary | ICD-10-CM | POA: Insufficient documentation

## 2014-01-11 DIAGNOSIS — R109 Unspecified abdominal pain: Secondary | ICD-10-CM | POA: Diagnosis not present

## 2014-01-11 DIAGNOSIS — M549 Dorsalgia, unspecified: Secondary | ICD-10-CM | POA: Insufficient documentation

## 2014-01-11 DIAGNOSIS — I10 Essential (primary) hypertension: Secondary | ICD-10-CM | POA: Diagnosis not present

## 2014-01-11 DIAGNOSIS — Z87442 Personal history of urinary calculi: Secondary | ICD-10-CM | POA: Diagnosis not present

## 2014-01-11 DIAGNOSIS — Z3202 Encounter for pregnancy test, result negative: Secondary | ICD-10-CM | POA: Insufficient documentation

## 2014-01-11 DIAGNOSIS — Z791 Long term (current) use of non-steroidal anti-inflammatories (NSAID): Secondary | ICD-10-CM | POA: Insufficient documentation

## 2014-01-11 LAB — BASIC METABOLIC PANEL
BUN: 14 mg/dL (ref 6–23)
CALCIUM: 9.2 mg/dL (ref 8.4–10.5)
CHLORIDE: 102 meq/L (ref 96–112)
CO2: 24 mEq/L (ref 19–32)
CREATININE: 0.89 mg/dL (ref 0.50–1.10)
GFR calc Af Amer: 89 mL/min — ABNORMAL LOW (ref >90)
GFR calc non Af Amer: 77 mL/min — ABNORMAL LOW (ref >90)
Glucose, Bld: 103 mg/dL — ABNORMAL HIGH (ref 70–99)
Potassium: 6.3 mEq/L — ABNORMAL HIGH (ref 3.7–5.3)
Sodium: 136 mEq/L — ABNORMAL LOW (ref 137–147)

## 2014-01-11 LAB — CBC WITH DIFFERENTIAL/PLATELET
BASOS PCT: 0 % (ref 0–1)
Basophils Absolute: 0 10*3/uL (ref 0.0–0.1)
Eosinophils Absolute: 0 10*3/uL (ref 0.0–0.7)
Eosinophils Relative: 0 % (ref 0–5)
HCT: 35.1 % — ABNORMAL LOW (ref 36.0–46.0)
HEMOGLOBIN: 11.1 g/dL — AB (ref 12.0–15.0)
LYMPHS PCT: 9 % — AB (ref 12–46)
Lymphs Abs: 0.6 10*3/uL — ABNORMAL LOW (ref 0.7–4.0)
MCH: 23 pg — AB (ref 26.0–34.0)
MCHC: 31.6 g/dL (ref 30.0–36.0)
MCV: 72.7 fL — ABNORMAL LOW (ref 78.0–100.0)
MONO ABS: 0.7 10*3/uL (ref 0.1–1.0)
Monocytes Relative: 10 % (ref 3–12)
Neutro Abs: 5.8 10*3/uL (ref 1.7–7.7)
Neutrophils Relative %: 81 % — ABNORMAL HIGH (ref 43–77)
Platelets: 290 10*3/uL (ref 150–400)
RBC: 4.83 MIL/uL (ref 3.87–5.11)
RDW: 14.2 % (ref 11.5–15.5)
WBC: 7.1 10*3/uL (ref 4.0–10.5)

## 2014-01-11 LAB — I-STAT CHEM 8, ED
BUN: 13 mg/dL (ref 6–23)
CALCIUM ION: 1.27 mmol/L — AB (ref 1.12–1.23)
CREATININE: 1 mg/dL (ref 0.50–1.10)
Chloride: 105 mEq/L (ref 96–112)
GLUCOSE: 102 mg/dL — AB (ref 70–99)
HEMATOCRIT: 37 % (ref 36.0–46.0)
HEMOGLOBIN: 12.6 g/dL (ref 12.0–15.0)
Potassium: 3.7 mEq/L (ref 3.7–5.3)
Sodium: 143 mEq/L (ref 137–147)
TCO2: 25 mmol/L (ref 0–100)

## 2014-01-11 LAB — URINALYSIS, ROUTINE W REFLEX MICROSCOPIC
Bilirubin Urine: NEGATIVE
GLUCOSE, UA: NEGATIVE mg/dL
Ketones, ur: NEGATIVE mg/dL
LEUKOCYTES UA: NEGATIVE
NITRITE: NEGATIVE
PH: 7 (ref 5.0–8.0)
Protein, ur: NEGATIVE mg/dL
SPECIFIC GRAVITY, URINE: 1.017 (ref 1.005–1.030)
Urobilinogen, UA: 1 mg/dL (ref 0.0–1.0)

## 2014-01-11 LAB — URINE MICROSCOPIC-ADD ON

## 2014-01-11 LAB — PREGNANCY, URINE: PREG TEST UR: NEGATIVE

## 2014-01-11 MED ORDER — ONDANSETRON HCL 4 MG/2ML IJ SOLN
4.0000 mg | Freq: Once | INTRAMUSCULAR | Status: AC
Start: 2014-01-11 — End: 2014-01-11
  Administered 2014-01-11: 4 mg via INTRAVENOUS
  Filled 2014-01-11: qty 2

## 2014-01-11 MED ORDER — HYDROMORPHONE HCL PF 1 MG/ML IJ SOLN
1.0000 mg | Freq: Once | INTRAMUSCULAR | Status: AC
  Administered 2014-01-11: 1 mg via INTRAVENOUS
  Filled 2014-01-11: qty 1

## 2014-01-11 MED ORDER — OXYCODONE-ACETAMINOPHEN 5-325 MG PO TABS: 1.0000 | ORAL_TABLET | ORAL | Status: DC | PRN

## 2014-01-11 NOTE — ED Notes (Signed)
MD at bedside. 

## 2014-01-11 NOTE — ED Notes (Signed)
Bladder scan completed. Approx 236ml in bladder at this time.  Patient ambulated without assistance to restroom after scan and was able to urinate without any problems. Patient states that her bladder "does not feel tight anymore."

## 2014-01-11 NOTE — ED Notes (Signed)
Pt reports back pain, malodorous urine and urinary frequency and retention, ongoing sx 1 day

## 2014-01-11 NOTE — ED Provider Notes (Signed)
CSN: [id number]     Arrival date & time 01/11/14  1343 History   First MD Initiated Contact with Patient 01/11/14 1955     Chief Complaint  Patient presents with  . Back Pain  . Urinary Retention     Patient is a 48 y.o. female presenting with flank pain. The history is provided by the patient.  Flank Pain This is a new problem. The current episode started 6 to 12 hours ago. The problem occurs constantly. The problem has been gradually worsening. Pertinent negatives include no chest pain and no shortness of breath. Exacerbated by: palpation. The symptoms are relieved by rest. She has tried rest for the symptoms. The treatment provided no relief.  pt reports acute onset of left flank pain with difficulty urinating She also reports associated vomitng/diarrhea today No fever No cp/sob She had otherwise been well prior to the onset of pain  She reports h/o kidney stone several yrs ago and this pain feels similar  LMP - several years ago  Past Medical History  Diagnosis Date  . Hypertension   . Blind   . Lupus    History reviewed. No pertinent past surgical history. Family History  Problem Relation Age of Onset  . Cancer Mother   . Cancer Father    History  Substance Use Topics  . Smoking status: Never Smoker   . Smokeless tobacco: Not on file  . Alcohol Use: No   OB History   Grav Para Term Preterm Abortions TAB SAB Ect Mult Living                 Review of Systems  Constitutional: Negative for fever.  Respiratory: Negative for shortness of breath.   Cardiovascular: Negative for chest pain.  Genitourinary: Positive for flank pain. Negative for vaginal bleeding.  Neurological: Negative for weakness.  All other systems reviewed and are negative.     Allergies  Review of patient's allergies indicates no known allergies.  Home Medications   Prior to Admission medications   Medication Sig Start Date End Date Taking? Authorizing Provider  azaTHIOprine (IMURAN) 50  MG tablet Take 50 mg by mouth daily.  02/10/13  Yes Historical Provider, MD  diclofenac sodium (VOLTAREN) 1 % GEL Apply 2 g topically daily as needed (for pain).    Yes Historical Provider, MD  ibandronate (BONIVA) 150 MG tablet Take 150 mg by mouth every 30 (thirty) days.  02/27/13  Yes Historical Provider, MD  LORazepam (ATIVAN) 0.5 MG tablet Take 0.5 mg by mouth every 8 (eight) hours as needed for anxiety.  02/10/13  Yes Historical Provider, MD  meloxicam (MOBIC) 7.5 MG tablet Take 7.5 mg by mouth daily.   Yes Historical Provider, MD  omeprazole (PRILOSEC) 20 MG capsule Take 20 mg by mouth daily.  01/18/13  Yes Historical Provider, MD  predniSONE (DELTASONE) 10 MG tablet Take 10 mg by mouth daily.   Yes Historical Provider, MD  oxyCODONE-acetaminophen (PERCOCET/ROXICET) 5-325 MG per tablet Take 1 tablet by mouth every 4 (four) hours as needed for severe pain. 01/11/14   Sharyon Cable, MD   BP 119/70  Pulse 79  Temp(Src) 99.3 F (37.4 C) (Oral)  Resp 18  Ht 5\' 1"  (1.549 m)  Wt 168 lb (76.204 kg)  BMI 31.76 kg/m2  SpO2 98% Physical Exam CONSTITUTIONAL: Well developed/well nourished HEAD: Normocephalic/atraumatic ENMT: Mucous membranes moist NECK: supple no meningeal signs SPINE:entire spine nontender CV: S1/S2 noted, no murmurs/rubs/gallops noted LUNGS: Lungs are clear to auscultation  bilaterally, no apparent distress ABDOMEN: soft, nontender, no rebound or guarding DJ:SHFW cva tenderness NEURO: Pt is awake/alert, moves all extremitiesx4 EXTREMITIES: pulses normal, full ROM SKIN: warm, color normal PSYCH: no abnormalities of mood noted  ED Course  Procedures   Pt uncomfortable on initial assessment.  With flank pain/hematuria suspicious for ureteral stone After pain meds given pain nearly resolved She is in no distress Labs reassuring (hyperkalemia likely due to hemolysis) No signs of UTI Pt stable for d/c home She was referred to urology as outpatient  Labs Review Labs  Reviewed  URINALYSIS, ROUTINE W REFLEX MICROSCOPIC - Abnormal; Notable for the following:    APPearance HAZY (*)    Hgb urine dipstick LARGE (*)    All other components within normal limits  BASIC METABOLIC PANEL - Abnormal; Notable for the following:    Sodium 136 (*)    Potassium 6.3 (*)    Glucose, Bld 103 (*)    GFR calc non Af Amer 77 (*)    GFR calc Af Amer 89 (*)    All other components within normal limits  CBC WITH DIFFERENTIAL - Abnormal; Notable for the following:    Hemoglobin 11.1 (*)    HCT 35.1 (*)    MCV 72.7 (*)    MCH 23.0 (*)    Neutrophils Relative % 81 (*)    Lymphocytes Relative 9 (*)    Lymphs Abs 0.6 (*)    All other components within normal limits  I-STAT CHEM 8, ED - Abnormal; Notable for the following:    Glucose, Bld 102 (*)    Calcium, Ion 1.27 (*)    All other components within normal limits  URINE CULTURE  URINE MICROSCOPIC-ADD ON  PREGNANCY, URINE     MDM   Final diagnoses:  Flank pain  Hematuria    Nursing notes including past medical history and social history reviewed and considered in documentation Labs/vital reviewed and considered Previous records reviewed and considered - previous Ct imaging reveal left UVJ stone in 2011     Sharyon Cable, MD 01/11/14 2331

## 2014-01-13 LAB — URINE CULTURE

## 2014-01-14 ENCOUNTER — Emergency Department (HOSPITAL_COMMUNITY): Payer: Medicare Other

## 2014-01-14 ENCOUNTER — Emergency Department (HOSPITAL_COMMUNITY)
Admission: EM | Admit: 2014-01-14 | Discharge: 2014-01-14 | Disposition: A | Discharge status: Home / Self Care | Payer: Medicare Other | Attending: Emergency Medicine | Admitting: Emergency Medicine

## 2014-01-14 ENCOUNTER — Encounter (HOSPITAL_COMMUNITY): Payer: Self-pay | Admitting: Emergency Medicine

## 2014-01-14 DIAGNOSIS — I10 Essential (primary) hypertension: Secondary | ICD-10-CM | POA: Insufficient documentation

## 2014-01-14 DIAGNOSIS — W268XXA Contact with other sharp object(s), not elsewhere classified, initial encounter: Secondary | ICD-10-CM | POA: Diagnosis not present

## 2014-01-14 DIAGNOSIS — Z79899 Other long term (current) drug therapy: Secondary | ICD-10-CM | POA: Diagnosis not present

## 2014-01-14 DIAGNOSIS — Z791 Long term (current) use of non-steroidal anti-inflammatories (NSAID): Secondary | ICD-10-CM | POA: Diagnosis not present

## 2014-01-14 DIAGNOSIS — Y9289 Other specified places as the place of occurrence of the external cause: Secondary | ICD-10-CM | POA: Diagnosis not present

## 2014-01-14 DIAGNOSIS — S61209A Unspecified open wound of unspecified finger without damage to nail, initial encounter: Secondary | ICD-10-CM | POA: Insufficient documentation

## 2014-01-14 DIAGNOSIS — S61219A Laceration without foreign body of unspecified finger without damage to nail, initial encounter: Secondary | ICD-10-CM

## 2014-01-14 DIAGNOSIS — IMO0002 Reserved for concepts with insufficient information to code with codable children: Secondary | ICD-10-CM | POA: Diagnosis not present

## 2014-01-14 DIAGNOSIS — Y93E9 Activity, other interior property and clothing maintenance: Secondary | ICD-10-CM | POA: Insufficient documentation

## 2014-01-14 NOTE — ED Notes (Signed)
Pt's wound cleaned and bandage applied

## 2014-01-14 NOTE — ED Notes (Signed)
Bed: WTR6 Expected date:  Expected time:  Means of arrival:  Comments: EMS- finger lac

## 2014-01-14 NOTE — ED Notes (Signed)
Per EMS pt from home, doing dishes, legally blind, couldn't tell one of drinking glasses was broke, sliced R index finger, stitches probably needed.

## 2014-01-14 NOTE — Discharge Instructions (Signed)
Laceration Care, Adult °A laceration is a cut that goes through all layers of the skin. The cut goes into the tissue beneath the skin. °HOME CARE °For stitches (sutures) or staples: °· Keep the cut clean and dry. °· If you have a bandage (dressing), change it at least once a day. Change the bandage if it gets wet or dirty, or as told by your doctor. °· Wash the cut with soap and water 2 times a day. Rinse the cut with water. Pat it dry with a clean towel. °· Put a thin layer of medicated cream on the cut as told by your doctor. °· You may shower after the first 24 hours. Do not soak the cut in water until the stitches are removed. °· Only take medicines as told by your doctor. °· Have your stitches or staples removed as told by your doctor. °For skin adhesive strips: °· Keep the cut clean and dry. °· Do not get the strips wet. You may take a bath, but be careful to keep the cut dry. °· If the cut gets wet, pat it dry with a clean towel. °· The strips will fall off on their own. Do not remove the strips that are still stuck to the cut. °For wound glue: °· You may shower or take baths. Do not soak or scrub the cut. Do not swim. Avoid heavy sweating until the glue falls off on its own. After a shower or bath, pat the cut dry with a clean towel. °· Do not put medicine on your cut until the glue falls off. °· If you have a bandage, do not put tape over the glue. °· Avoid lots of sunlight or tanning lamps until the glue falls off. Put sunscreen on the cut for the first year to reduce your scar. °· The glue will fall off on its own. Do not pick at the glue. °You may need a tetanus shot if: °· You cannot remember when you had your last tetanus shot. °· You have never had a tetanus shot. °If you need a tetanus shot and you choose not to have one, you may get tetanus. Sickness from tetanus can be serious. °GET HELP RIGHT AWAY IF:  °· Your pain does not get better with medicine. °· Your arm, hand, leg, or foot loses feeling  (numbness) or changes color. °· Your cut is bleeding. °· Your joint feels weak, or you cannot use your joint. °· You have painful lumps on your body. °· Your cut is red, puffy (swollen), or painful. °· You have a red line on the skin near the cut. °· You have yellowish-white fluid (pus) coming from the cut. °· You have a fever. °· You have a bad smell coming from the cut or bandage. °· Your cut breaks open before or after stitches are removed. °· You notice something coming out of the cut, such as wood or glass. °· You cannot move a finger or toe. °MAKE SURE YOU:  °· Understand these instructions. °· Will watch your condition. °· Will get help right away if you are not doing well or get worse. °Document Released: 02/13/2008 Document Revised: 11/19/2011 Document Reviewed: 02/20/2011 °[employer]® Patient Information ©2014 [employer]. ° ° ° ° °

## 2014-01-14 NOTE — ED Provider Notes (Signed)
CSN: [id number]     Arrival date & time 01/14/14  1743 History   First MD Initiated Contact with Patient 01/14/14 1746     This chart was scribed for non-physician practitioner, Cleatrice Burke PA-C, working with Richarda Blade, MD by Forrestine Him, ED Scribe. This patient was seen in room WTR6/WTR6 and the patient's care was started at [place] PM.   Chief Complaint  Patient presents with  . finger laceration    HPI  HPI Comments: Andrea Berry is a dominant R handed 48 y.o. Female with a PMHx of HTN and Lupus who presents to the Emergency Department complaining of a finger laceration to the R index finger sustained just prior to arrival. Pt describes her discomfort as burning; bleeding controlled at this time. She states she was washing some dirty dishes when she cut her finger on a broken piece of glass. She has not tried anything OTC or any home remedies to the open area. At this time she denies any fever, chills, weakness, tingling, or loss of sensation. Tetanus shot UTD. She denies a personal history of diabetes. She has no other pertinent past medical history. No other concerns this visit.   Past Medical History  Diagnosis Date  . Hypertension   . Blind   . Lupus    History reviewed. No pertinent past surgical history. Family History  Problem Relation Age of Onset  . Cancer Mother   . Cancer Father    History  Substance Use Topics  . Smoking status: Never Smoker   . Smokeless tobacco: Not on file  . Alcohol Use: No   OB History   Grav Para Term Preterm Abortions TAB SAB Ect Mult Living                 Review of Systems  Constitutional: Negative for fever and chills.  HENT: Negative for congestion.   Eyes: Negative for redness.  Respiratory: Negative for cough.   Skin: Positive for wound (R index finger). Negative for rash.  Neurological: Negative for weakness and numbness.  Psychiatric/Behavioral: Negative for confusion.      Allergies  Review of patient's allergies  indicates no known allergies.  Home Medications   Prior to Admission medications   Medication Sig Start Date End Date Taking? Authorizing Provider  azaTHIOprine (IMURAN) 50 MG tablet Take 50 mg by mouth daily.  02/10/13   Historical Provider, MD  diclofenac sodium (VOLTAREN) 1 % GEL Apply 2 g topically daily as needed (for pain).     Historical Provider, MD  ibandronate (BONIVA) 150 MG tablet Take 150 mg by mouth every 30 (thirty) days.  02/27/13   Historical Provider, MD  LORazepam (ATIVAN) 0.5 MG tablet Take 0.5 mg by mouth every 8 (eight) hours as needed for anxiety.  02/10/13   Historical Provider, MD  meloxicam (MOBIC) 7.5 MG tablet Take 7.5 mg by mouth daily.    Historical Provider, MD  omeprazole (PRILOSEC) 20 MG capsule Take 20 mg by mouth daily.  01/18/13   Historical Provider, MD  oxyCODONE-acetaminophen (PERCOCET/ROXICET) 5-325 MG per tablet Take 1 tablet by mouth every 4 (four) hours as needed for severe pain. 01/11/14   Sharyon Cable, MD  predniSONE (DELTASONE) 10 MG tablet Take 10 mg by mouth daily.    Historical Provider, MD   Triage Vitals: BP 144/80  Pulse 101  Temp(Src) 98.9 F (37.2 C) (Oral)  Resp 18  Ht 5\' 1"  (1.549 m)  Wt 168 lb (76.204 kg)  BMI  31.76 kg/m2  SpO2 100%   Physical Exam  Nursing note and vitals reviewed. Constitutional: She is oriented to person, place, and time. She appears well-developed and well-nourished.  HENT:  Head: Normocephalic and atraumatic.  Eyes: EOM are normal.  Neck: Normal range of motion.  Cardiovascular: Normal rate, regular rhythm and normal heart sounds.   No tachycardia on my exam  Pulmonary/Chest: Effort normal.  Musculoskeletal: Normal range of motion.  Strength with flexion and extension on R index finger 5/5 tested against resistiance  Neurological: She is alert and oriented to person, place, and time.  Sensation intact  Skin: Skin is warm and dry.  Superficial 2 cm laceration to the dorsal aspect of R index finger   Psychiatric: She has a normal mood and affect. Her behavior is normal.    ED Course  Procedures (including critical care time)  DIAGNOSTIC STUDIES: Oxygen Saturation is 100% on RA, Normal by my interpretation.    COORDINATION OF CARE: 6:16 PM- Will order DG Finger index R. Will perform a laceration repair. Discussed treatment plan with pt at bedside and pt agreed to plan.     7:15 PM- LACERATION REPAIR Performed by: Cleatrice Burke PA-C,  Consent: Verbal consent obtained. Risks and benefits: risks, benefits and alternatives were discussed Patient identity confirmed: provided demographic data Time out performed prior to procedure Prepped and Draped in normal sterile fashion Wound explored Laceration Location: R index finger Laceration Length: 2 cm No Foreign Bodies seen or palpated Anesthesia: digital block Local anesthetic: lidocaine 1% without epinephrine Anesthetic total: 3 ml Irrigation method: syringe Amount of cleaning: standard Skin closure: 4-0 ethalon Number of sutures or staples: 4 Technique: Simple interrupted Patient tolerance: Patient tolerated the procedure well with no immediate complications.   Labs Review Labs Reviewed - No data to display  Imaging Review Dg Finger Index Right  01/14/2014   CLINICAL DATA:  Cut index finger on broken glass  EXAM: RIGHT INDEX FINGER 2+V  COMPARISON:  None.  FINDINGS: There is no evidence of fracture or dislocation. There is no evidence of arthropathy or other focal bone abnormality. Soft tissues are unremarkable.  IMPRESSION: Negative.   Electronically Signed   By: Kathreen Devoid   On: 01/14/2014 18:55     EKG Interpretation None      MDM   Final diagnoses:  Finger laceration    Tdap booster UTD. Wound cleaning complete with pressure irrigation, bottom of wound visualized, no foreign bodies appreciated. Laceration occurred < 8 hours prior to repair which was well tolerated. Pt has no co morbidities to effect normal  wound healing. Discussed suture home care w pt and answered questions. Pt to f-u for wound check and suture removal in 7 days. Pt is hemodynamically stable w no complaints prior to dc.    I personally performed the services described in this documentation, which was scribed in my presence. The recorded information has been reviewed and is accurate.    Elwyn Lade, PA-C 01/17/14 [phone]

## 2014-01-19 NOTE — ED Provider Notes (Signed)
Medical screening examination/treatment/procedure(s) were performed by non-physician practitioner and as supervising physician I was immediately available for consultation/collaboration.  Richarda Blade, MD 01/19/14 [phone]

## 2014-01-22 ENCOUNTER — Ambulatory Visit: Payer: Medicare Other | Admitting: Advanced Practice Midwife

## 2014-01-23 ENCOUNTER — Emergency Department (INDEPENDENT_AMBULATORY_CARE_PROVIDER_SITE_OTHER)
Admission: EM | Admit: 2014-01-23 | Discharge: 2014-01-23 | Disposition: A | Discharge status: Home / Self Care | Payer: Medicare Other | Source: Home / Self Care | Attending: Emergency Medicine | Admitting: Emergency Medicine

## 2014-01-23 ENCOUNTER — Encounter (HOSPITAL_COMMUNITY): Payer: Self-pay | Admitting: Emergency Medicine

## 2014-01-23 DIAGNOSIS — IMO0002 Reserved for concepts with insufficient information to code with codable children: Secondary | ICD-10-CM

## 2014-01-23 DIAGNOSIS — T148XXA Other injury of unspecified body region, initial encounter: Secondary | ICD-10-CM

## 2014-01-23 NOTE — ED Notes (Signed)
Here  For  Suture  Removal          Appears  Well  Healing    Sutures have  Been in  For  9  Days

## 2014-01-23 NOTE — Discharge Instructions (Signed)
Andrea Berry,   You should continue to heal well over the next week. Please leave the dressing on your right finger for the next 48 hours. Then you can wash it as normal. If you have swelling, drainage, or tenderness that is worsening, then please follow up.   Take Care,   Dr. Maricela Bo

## 2014-01-23 NOTE — ED Provider Notes (Signed)
CSN: [id number]     Arrival date & time 01/23/14  1653 History   First MD Initiated Contact with Patient 01/23/14 1753     Chief Complaint  Patient presents with  . Suture / Staple Removal   (Consider location/radiation/quality/duration/timing/severity/associated sxs/prior Treatment) HPI  48 year old F presenting for follow up of finger laceration of the right index finger. This occurred 01/14/14 and was repaired with 4 sutures. She has not had any bleeding or drainage, but the finger is still very sore.   Past Medical History  Diagnosis Date  . Hypertension   . Blind   . Lupus    History reviewed. No pertinent past surgical history. Family History  Problem Relation Age of Onset  . Cancer Mother   . Cancer Father    History  Substance Use Topics  . Smoking status: Never Smoker   . Smokeless tobacco: Not on file  . Alcohol Use: No   OB History   Grav Para Term Preterm Abortions TAB SAB Ect Mult Living                 Review of Systems Negative for fever or chills, hand swelling, pain  Allergies  Review of patient's allergies indicates no known allergies.  Home Medications   Prior to Admission medications   Medication Sig Start Date End Date Taking? Authorizing Provider  azaTHIOprine (IMURAN) 50 MG tablet Take 50 mg by mouth daily.  02/10/13   Historical Provider, MD  diclofenac sodium (VOLTAREN) 1 % GEL Apply 2 g topically daily as needed (for pain).     Historical Provider, MD  ibandronate (BONIVA) 150 MG tablet Take 150 mg by mouth every 30 (thirty) days.  02/27/13   Historical Provider, MD  LORazepam (ATIVAN) 0.5 MG tablet Take 0.5 mg by mouth every 8 (eight) hours as needed for anxiety.  02/10/13   Historical Provider, MD  meloxicam (MOBIC) 7.5 MG tablet Take 7.5 mg by mouth daily.    Historical Provider, MD  omeprazole (PRILOSEC) 20 MG capsule Take 20 mg by mouth daily.  01/18/13   Historical Provider, MD  oxyCODONE-acetaminophen (PERCOCET/ROXICET) 5-325 MG per tablet  Take 1 tablet by mouth every 4 (four) hours as needed for severe pain. 01/11/14   Sharyon Cable, MD  predniSONE (DELTASONE) 10 MG tablet Take 10 mg by mouth daily.    Historical Provider, MD   BP 168/91  Pulse 79  Temp(Src) 99.7 F (37.6 C) (Oral)  Resp 17  SpO2 97% Physical Exam Gen: well appearing, middle aged, blind  Skin: 2 cm elliptical healing and hemostasis but poor tissue approximation at wound edges  ED Course  Procedures (including critical care time) Labs Review Labs Reviewed - No data to display  Imaging Review No results found.   MDM   1. Laceration    Sutures removed without problem. Wound still needed approximation of tissue edges for improved healing. Area cleansed with benzoin. 4 steri strips placed to approximate edges of wound. Bandaged with sterile gauze and tape. Given instruction for care and indications for follow up.     Angelica Ran, MD 01/23/14 [phone]

## 2014-01-25 NOTE — ED Provider Notes (Signed)
Medical screening examination/treatment/procedure(s) were performed by a resident physician and as supervising physician I was immediately available for consultation/collaboration.  Philipp Deputy, M.D.  Harden Mo, MD 01/25/14 [phone]

## 2014-01-29 ENCOUNTER — Encounter: Payer: Self-pay | Admitting: Advanced Practice Midwife

## 2014-01-29 ENCOUNTER — Ambulatory Visit: Payer: Medicare Other | Admitting: Advanced Practice Midwife

## 2014-01-29 ENCOUNTER — Ambulatory Visit (INDEPENDENT_AMBULATORY_CARE_PROVIDER_SITE_OTHER): Payer: Medicaid Other | Admitting: Advanced Practice Midwife

## 2014-01-29 VITALS — BP 143/83 | HR 103 | Temp 100.1°F | Ht 61.0 in | Wt 166.0 lb

## 2014-01-29 DIAGNOSIS — N760 Acute vaginitis: Secondary | ICD-10-CM

## 2014-01-29 DIAGNOSIS — H543 Unqualified visual loss, both eyes: Secondary | ICD-10-CM

## 2014-01-29 DIAGNOSIS — Z01419 Encounter for gynecological examination (general) (routine) without abnormal findings: Secondary | ICD-10-CM

## 2014-01-29 DIAGNOSIS — H547 Unspecified visual loss: Secondary | ICD-10-CM

## 2014-01-29 DIAGNOSIS — Z0000 Encounter for general adult medical examination without abnormal findings: Secondary | ICD-10-CM

## 2014-01-30 ENCOUNTER — Other Ambulatory Visit: Payer: Self-pay | Admitting: Advanced Practice Midwife

## 2014-01-30 ENCOUNTER — Encounter: Payer: Self-pay | Admitting: Advanced Practice Midwife

## 2014-01-30 DIAGNOSIS — H548 Legal blindness, as defined in USA: Secondary | ICD-10-CM | POA: Insufficient documentation

## 2014-01-30 DIAGNOSIS — B9689 Other specified bacterial agents as the cause of diseases classified elsewhere: Secondary | ICD-10-CM

## 2014-01-30 DIAGNOSIS — N760 Acute vaginitis: Principal | ICD-10-CM

## 2014-01-30 HISTORY — DX: Legal blindness, as defined in USA: H54.8

## 2014-01-30 LAB — WET PREP BY MOLECULAR PROBE
Candida species: NEGATIVE
Gardnerella vaginalis: POSITIVE — AB
Trichomonas vaginosis: NEGATIVE

## 2014-01-30 MED ORDER — METRONIDAZOLE 500 MG PO TABS: 500.0000 mg | ORAL_TABLET | Freq: Two times a day (BID) | ORAL | Status: DC

## 2014-01-30 NOTE — Progress Notes (Signed)
  Subjective:    Andrea Berry is a 48 y.o. female who presents for complaints of vaginal odor. Sexual history reviewed with the patient. STI Exposure: denies knowledge of risky exposure.  Patient c/o abnormal odor x1 month. Denies being sexually active. Denies other pelvic concerns at this time.   Menstrual History: Amenorrhea x5 years   OB History   Grav Para Term Preterm Abortions TAB SAB Ect Mult Living   3 3 3       3       No LMP recorded. Patient is postmenopausal.    The following portions of the patient's history were reviewed and updated as appropriate: allergies, current medications, past family history, past medical history, past social history, past surgical history and problem list.  Review of Systems A comprehensive review of systems was negative except for: Constitutional: positive for generalized back pain from kidney stones Gastrointestinal: positive for normal  Genitourinary: positive for urinary odor and patient is currently under care of a nephrologist for kidney stones    Objective:    BP 143/83  Pulse 103  Temp(Src) 100.1 F (37.8 C)  Ht 5\' 1"  (1.549 m)  Wt 166 lb (75.297 kg)  BMI 31.38 kg/m2 General:   alert and cooperative  Lymph Nodes:   Cervical, supraclavicular, and axillary nodes normal.  Pelvis:  Vulva and vagina appear normal. Bimanual exam reveals normal uterus and adnexa. Vaginal: normal mucosa without prolapse or lesions Clinical staff offered to be present for exam: yes  Initials: Kristina  Cultures:  Affirm     Microscopic wet-mount exam shows, negative whiff, small amount of clue cells, negative hyphae, negative trich, inconclusive.  Review of Records: Last Mammogram 12/2012 WNL  Assessment:        Affirm sent and pending Cannot rule out BV Kidney stones Patient Active Problem List   Diagnosis Date Noted  . Blind 01/30/2014  . Multiple thyroid nodules 03/20/2013  . GERD (gastroesophageal reflux disease) 03/20/2013      Plan:      RTC PRN Discussed methods to prevent BV, encouraged patient to stop douching Affirm + for BV following appt. Flagyl Rx sent to pharmacy. Called patient number and left message.  Patient to cont care w/ urology and PCP. (Patient has well visit scheduled w/ PCP). Encouraged a mammogram in near future. Patient is due for pap smear in 2017 based on self reported normal pap smear in 2012.    Andrea Berry Andrea Berry CNM

## 2014-02-02 LAB — POCT URINE PREGNANCY: Preg Test, Ur: NEGATIVE

## 2014-02-02 NOTE — Addendum Note (Signed)
Addended by: Ladona Ridgel on: 02/02/2014 05:03 PM   Modules accepted: Orders

## 2014-02-05 ENCOUNTER — Encounter (HOSPITAL_COMMUNITY): Payer: Self-pay | Admitting: *Deleted

## 2014-02-05 ENCOUNTER — Other Ambulatory Visit: Payer: Self-pay | Admitting: Urology

## 2014-02-05 NOTE — Progress Notes (Signed)
Urine Pregnancy test ordered in error. Patient not billed for test. Supervisor notified.

## 2014-02-08 ENCOUNTER — Ambulatory Visit (HOSPITAL_COMMUNITY): Payer: Medicare Other

## 2014-02-08 ENCOUNTER — Encounter (HOSPITAL_COMMUNITY)
Admission: RE | Disposition: A | Discharge status: Home / Self Care | Payer: Self-pay | Source: Ambulatory Visit | Attending: Urology

## 2014-02-08 ENCOUNTER — Ambulatory Visit (HOSPITAL_COMMUNITY)
Admission: RE | Admit: 2014-02-08 | Discharge: 2014-02-08 | Disposition: A | Discharge status: Home / Self Care | Payer: Medicare Other | Source: Ambulatory Visit | Attending: Urology | Admitting: Urology

## 2014-02-08 ENCOUNTER — Encounter (HOSPITAL_COMMUNITY): Payer: Self-pay | Admitting: *Deleted

## 2014-02-08 DIAGNOSIS — H543 Unqualified visual loss, both eyes: Secondary | ICD-10-CM | POA: Diagnosis not present

## 2014-02-08 DIAGNOSIS — K219 Gastro-esophageal reflux disease without esophagitis: Secondary | ICD-10-CM | POA: Insufficient documentation

## 2014-02-08 DIAGNOSIS — F411 Generalized anxiety disorder: Secondary | ICD-10-CM | POA: Insufficient documentation

## 2014-02-08 DIAGNOSIS — N201 Calculus of ureter: Secondary | ICD-10-CM | POA: Diagnosis present

## 2014-02-08 DIAGNOSIS — F3289 Other specified depressive episodes: Secondary | ICD-10-CM | POA: Diagnosis not present

## 2014-02-08 DIAGNOSIS — I10 Essential (primary) hypertension: Secondary | ICD-10-CM | POA: Diagnosis not present

## 2014-02-08 DIAGNOSIS — Z79899 Other long term (current) drug therapy: Secondary | ICD-10-CM | POA: Diagnosis not present

## 2014-02-08 DIAGNOSIS — M329 Systemic lupus erythematosus, unspecified: Secondary | ICD-10-CM | POA: Diagnosis not present

## 2014-02-08 DIAGNOSIS — F329 Major depressive disorder, single episode, unspecified: Secondary | ICD-10-CM | POA: Insufficient documentation

## 2014-02-08 DIAGNOSIS — N200 Calculus of kidney: Secondary | ICD-10-CM

## 2014-02-08 DIAGNOSIS — M129 Arthropathy, unspecified: Secondary | ICD-10-CM | POA: Insufficient documentation

## 2014-02-08 HISTORY — DX: Cerebral infarction, unspecified: I63.9

## 2014-02-08 HISTORY — DX: Headache: R51

## 2014-02-08 HISTORY — DX: Depression, unspecified: F32.A

## 2014-02-08 HISTORY — DX: Shortness of breath: R06.02

## 2014-02-08 HISTORY — DX: Gastro-esophageal reflux disease without esophagitis: K21.9

## 2014-02-08 HISTORY — DX: Unspecified osteoarthritis, unspecified site: M19.90

## 2014-02-08 HISTORY — DX: Major depressive disorder, single episode, unspecified: F32.9

## 2014-02-08 SURGERY — LITHOTRIPSY, ESWL
Anesthesia: LOCAL | Laterality: Left

## 2014-02-08 MED ORDER — CIPROFLOXACIN HCL 500 MG PO TABS
500.0000 mg | ORAL_TABLET | ORAL | Status: AC
  Administered 2014-02-08: 500 mg via ORAL
  Filled 2014-02-08: qty 1

## 2014-02-08 MED ORDER — SODIUM CHLORIDE 0.9 % IV SOLN
INTRAVENOUS | Status: DC
  Administered 2014-02-08: 1000 mL via INTRAVENOUS

## 2014-02-08 MED ORDER — DIAZEPAM 5 MG PO TABS
10.0000 mg | ORAL_TABLET | ORAL | Status: AC
  Administered 2014-02-08: 10 mg via ORAL
  Filled 2014-02-08: qty 2

## 2014-02-08 MED ORDER — TAMSULOSIN HCL 0.4 MG PO CAPS: 0.4000 mg | ORAL_CAPSULE | Freq: Every day | ORAL | Status: DC

## 2014-02-08 MED ORDER — DIPHENHYDRAMINE HCL 25 MG PO CAPS
25.0000 mg | ORAL_CAPSULE | ORAL | Status: AC
  Administered 2014-02-08: 25 mg via ORAL
  Filled 2014-02-08: qty 1

## 2014-02-08 NOTE — Op Note (Signed)
See [facility] OP note scanned into chart. 

## 2014-02-08 NOTE — Discharge Instructions (Signed)
See [facility] discharge instructions in chart.  

## 2014-02-08 NOTE — H&P (Signed)
Reason For Visit left 52mm distal ureteral stone   History of Present Illness 11F with history of 69mm distal ureteral stone that has been managed with medical explusion therapy. She presents today for 2 week f/u. Patient has had significant symptoms of renal colic and voiding symptoms. The oxycodone that I prescribed to her is not been effective in controlling her pain. She continues to take the Flomax daily. She denies any fevers or chills. She denies any dysuria.   Past Medical History Problems  1. History of Anxiety (300.00) 2. History of arthritis (V13.4) 3. History of blindness (V12.49) 4. History of depression (V11.8) 5. History of esophageal reflux (V12.79) 6. History of hypertension (V12.59) 7. History of systemic lupus erythematosus (SLE) (V12.29)  Surgical History Problems  1. History of Tubal Ligation  Current Meds 1. AzaTHIOprine 50 MG Oral Tablet;  Therapy: (Recorded:11May2015) to Recorded 2. Baclofen 10 MG Oral Tablet;  Therapy: (Recorded:11May2015) to Recorded 3. Ibandronate Sodium 150 MG Oral Tablet;  Therapy: (Recorded:11May2015) to Recorded 4. LORazepam 0.5 MG Oral Tablet;  Therapy: (Recorded:11May2015) to Recorded 5. Lyrica 50 MG Oral Capsule;  Therapy: (Recorded:11May2015) to Recorded 6. Meloxicam 7.5 MG Oral Tablet;  Therapy: (Recorded:11May2015) to Recorded 7. Methocarbamol 500 MG Oral Tablet;  Therapy: (Recorded:11May2015) to Recorded 8. Multiple Vitamin TABS;  Therapy: (Recorded:11May2015) to Recorded 9. Nitrofurantoin Monohyd Macro 100 MG Oral Capsule; TAKE 1 CAPSULE Every twelve  hours;  Therapy: [id number] to (Evaluate:17May2015)  Requested for: [id number]; Last  Rx:14May2015 Ordered 10. Omeprazole 20 MG Oral Capsule Delayed Release;   Therapy: (Recorded:11May2015) to Recorded 11. OxyCODONE HCl - 5 MG Oral Capsule; TAKE 1 CAPSULE EVERY 3 TO 4 HOURS AS   NEEDED FOR PAIN;   Therapy: [id number] to (Evaluate:16May2015); Last Rx:11May2015 Ordered 12.  Oxycodone-Acetaminophen 5-325 MG Oral Tablet;   Therapy: (Recorded:11May2015) to Recorded 13. PredniSONE 10 MG Oral Tablet;   Therapy: (Recorded:11May2015) to Recorded 14. Tamsulosin HCl - 0.4 MG Oral Capsule; TAKE 1 CAPSULE Daily;   Therapy: [id number] to (Evaluate:10Jul2015)  Requested for: 62VOJ5009; Last   Rx:11May2015 Ordered 15. Voltaren 1 % GEL;   Therapy: (Recorded:11May2015) to Recorded  Allergies Medication  1. No Known Drug Allergies  Family History Problems  1. Family history of kidney stones (V18.69) : Father 2. Family history of malignant neoplasm (V16.9) : Mother, Father, Sister 3. Family history of renal failure (V18.69) : Father 4. Family history of systemic lupus erythematosus (V19.4) : Mother  Social History Problems  1. Denied: History of Alcohol use 2. Caffeine use (V49.89) 3. Death in the family, father   age 58 4. Death in the family, mother   age[age]. Never a smoker 6. Single 7. Three children  Review of Systems No changes in pts bowel habits, neurological changes, or progressive lower urinary tract symptoms.    Vitals Vital Signs [Data Includes: Last 1 Day]  Recorded: [id number] 04:25PM  Blood Pressure: 144 / 89 Heart Rate: 96  Physical Exam Constitutional: Well nourished and well developed . No acute distress.  ENT:. The ears and nose are normal in appearance.  Pulmonary: No respiratory distress and normal respiratory rhythm and effort.  Cardiovascular: Heart rate and rhythm are normal . No peripheral edema.  Neuro/Psych:. Mood and affect are appropriate.    Results/Data Urine [Data Includes: Last 1 Day]   [id number]  COLOR YELLOW   APPEARANCE CLEAR   SPECIFIC GRAVITY 1.020   pH 6.5   GLUCOSE NEG mg/dL  BILIRUBIN NEG   KETONE NEG mg/dL  BLOOD NEG  PROTEIN NEG mg/dL  UROBILINOGEN 0.2 mg/dL  NITRITE NEG   LEUKOCYTE ESTERASE TRACE   SQUAMOUS EPITHELIAL/HPF MODERATE   WBC 11-20 WBC/hpf  RBC 0-2 RBC/hpf  BACTERIA MODERATE    CRYSTALS NONE SEEN   CASTS NONE SEEN   Other MUCUS NOTED    KUB as above tender clinic today to assess the stone position. The patient is noted to have a 5-6 mm stone in the left distal ureter near the bladder. There are no additional calcifications within the expected trajectory of the left ureter. The renal shadows are visible bilaterally and there are no additional fragments noted.   Assessment Left distal ureteral stone with associated intermittent renal colic   Plan Gross hematuria, PMH: History of systemic lupus erythematosus (SLE)  1. URINE CULTURE; Status:In Progress - Specimen/Data Collected;   Done: [id number] Health Maintenance  2. UA With REFLEX; [Do Not Release]; Status:Complete;   Done: 41DEY8144 04:03PM Nephrolithiasis  3. KUB; Status:Resulted - Requires Verification;   Done: 81EHU3149 12:00AM  Discussion/Summary I went over the treatment options for the patient including continued medical expulsion therapy, shockwave lithotripsy, and ureteroscopy. I detailed the risks and benefits of all 3. The patient would like to have something the near future to minimize her discomfort. She is opted for ESWL. I went over the risks and benefits of this procedure in detail with the patient she understands that there may be additional procedures required to relieve her obstruction. In addition she understands that she needs to hold her meloxicam as well as her ibuprofen for 3 days prior to the procedure. We'll get her scheduled for this as soon as possible.

## 2014-06-24 ENCOUNTER — Emergency Department (HOSPITAL_COMMUNITY)
Admission: EM | Admit: 2014-06-24 | Discharge: 2014-06-24 | Disposition: A | Discharge status: Home / Self Care | Payer: Medicare Other | Attending: Emergency Medicine | Admitting: Emergency Medicine

## 2014-06-24 ENCOUNTER — Emergency Department (HOSPITAL_COMMUNITY): Payer: Medicare Other

## 2014-06-24 ENCOUNTER — Encounter (HOSPITAL_COMMUNITY): Payer: Self-pay | Admitting: Emergency Medicine

## 2014-06-24 DIAGNOSIS — Z8659 Personal history of other mental and behavioral disorders: Secondary | ICD-10-CM | POA: Diagnosis not present

## 2014-06-24 DIAGNOSIS — H540 Blindness, both eyes: Secondary | ICD-10-CM | POA: Insufficient documentation

## 2014-06-24 DIAGNOSIS — Z79899 Other long term (current) drug therapy: Secondary | ICD-10-CM | POA: Insufficient documentation

## 2014-06-24 DIAGNOSIS — Z8739 Personal history of other diseases of the musculoskeletal system and connective tissue: Secondary | ICD-10-CM | POA: Insufficient documentation

## 2014-06-24 DIAGNOSIS — M1990 Unspecified osteoarthritis, unspecified site: Secondary | ICD-10-CM | POA: Diagnosis not present

## 2014-06-24 DIAGNOSIS — R51 Headache: Secondary | ICD-10-CM | POA: Diagnosis present

## 2014-06-24 DIAGNOSIS — K219 Gastro-esophageal reflux disease without esophagitis: Secondary | ICD-10-CM | POA: Diagnosis not present

## 2014-06-24 DIAGNOSIS — Z87442 Personal history of urinary calculi: Secondary | ICD-10-CM | POA: Diagnosis not present

## 2014-06-24 DIAGNOSIS — I10 Essential (primary) hypertension: Secondary | ICD-10-CM | POA: Insufficient documentation

## 2014-06-24 DIAGNOSIS — G4489 Other headache syndrome: Secondary | ICD-10-CM | POA: Diagnosis not present

## 2014-06-24 DIAGNOSIS — Z8673 Personal history of transient ischemic attack (TIA), and cerebral infarction without residual deficits: Secondary | ICD-10-CM | POA: Insufficient documentation

## 2014-06-24 DIAGNOSIS — Z791 Long term (current) use of non-steroidal anti-inflammatories (NSAID): Secondary | ICD-10-CM | POA: Insufficient documentation

## 2014-06-24 LAB — URINALYSIS, ROUTINE W REFLEX MICROSCOPIC
Bilirubin Urine: NEGATIVE
GLUCOSE, UA: NEGATIVE mg/dL
Hgb urine dipstick: NEGATIVE
KETONES UR: NEGATIVE mg/dL
Leukocytes, UA: NEGATIVE
NITRITE: NEGATIVE
PH: 6 (ref 5.0–8.0)
PROTEIN: NEGATIVE mg/dL
Specific Gravity, Urine: 1.023 (ref 1.005–1.030)
Urobilinogen, UA: 0.2 mg/dL (ref 0.0–1.0)

## 2014-06-24 LAB — COMPREHENSIVE METABOLIC PANEL
ALBUMIN: 4 g/dL (ref 3.5–5.2)
ALT: 14 U/L (ref 0–35)
ANION GAP: 14 (ref 5–15)
AST: 24 U/L (ref 0–37)
Alkaline Phosphatase: 40 U/L (ref 39–117)
BUN: 14 mg/dL (ref 6–23)
CO2: 21 mEq/L (ref 19–32)
CREATININE: 0.83 mg/dL (ref 0.50–1.10)
Calcium: 9.6 mg/dL (ref 8.4–10.5)
Chloride: 105 mEq/L (ref 96–112)
GFR calc Af Amer: >90 mL/min (ref >90)
GFR calc non Af Amer: 83 mL/min — ABNORMAL LOW (ref >90)
Glucose, Bld: 95 mg/dL (ref 70–99)
Potassium: 3.5 mEq/L — ABNORMAL LOW (ref 3.7–5.3)
Sodium: 140 mEq/L (ref 137–147)
Total Bilirubin: 0.2 mg/dL — ABNORMAL LOW (ref 0.3–1.2)
Total Protein: 8 g/dL (ref 6.0–8.3)

## 2014-06-24 LAB — CBC WITH DIFFERENTIAL/PLATELET
BASOS ABS: 0 10*3/uL (ref 0.0–0.1)
Basophils Relative: 0 % (ref 0–1)
EOS ABS: 0 10*3/uL (ref 0.0–0.7)
Eosinophils Relative: 1 % (ref 0–5)
HEMATOCRIT: 35.7 % — AB (ref 36.0–46.0)
Hemoglobin: 11.4 g/dL — ABNORMAL LOW (ref 12.0–15.0)
LYMPHS ABS: 1.2 10*3/uL (ref 0.7–4.0)
Lymphocytes Relative: 34 % (ref 12–46)
MCH: 23 pg — AB (ref 26.0–34.0)
MCHC: 31.9 g/dL (ref 30.0–36.0)
MCV: 72.1 fL — AB (ref 78.0–100.0)
Monocytes Absolute: 0.2 10*3/uL (ref 0.1–1.0)
Monocytes Relative: 6 % (ref 3–12)
NEUTROS ABS: 2 10*3/uL (ref 1.7–7.7)
Neutrophils Relative %: 59 % (ref 43–77)
Platelets: 182 10*3/uL (ref 150–400)
RBC: 4.95 MIL/uL (ref 3.87–5.11)
RDW: 14 % (ref 11.5–15.5)
WBC: 3.4 10*3/uL — AB (ref 4.0–10.5)

## 2014-06-24 MED ORDER — GABAPENTIN 300 MG PO CAPS: 300.0000 mg | ORAL_CAPSULE | Freq: Three times a day (TID) | ORAL | Status: DC

## 2014-06-24 MED ORDER — LORAZEPAM 2 MG/ML IJ SOLN
2.0000 mg | Freq: Once | INTRAMUSCULAR | Status: AC
  Administered 2014-06-24: 2 mg via INTRAMUSCULAR
  Filled 2014-06-24: qty 1

## 2014-06-24 MED ORDER — TETRACAINE HCL 0.5 % OP SOLN
2.0000 [drp] | Freq: Once | OPHTHALMIC | Status: AC
  Administered 2014-06-24: 2 [drp] via OPHTHALMIC
  Filled 2014-06-24: qty 2

## 2014-06-24 NOTE — ED Notes (Signed)
ED PA at bedside

## 2014-06-24 NOTE — ED Provider Notes (Signed)
I have reviewed the documentation of the resident and agree.   Sharyon Cable, MD 06/24/14 [phone]

## 2014-06-24 NOTE — ED Notes (Signed)
Headache began Monday and she developed face tightness yesterday / Pt. Describes it as someone is pulling her face to the right. Pt. Has no facial droop, speech is clear.  Moves all extremeties. No distress noted.

## 2014-06-24 NOTE — Discharge Instructions (Signed)

## 2014-06-24 NOTE — ED Provider Notes (Signed)
Care assumed from Carmine @ 0300. Patient awaiting MRI to rule out stroke. If negative we'll put on gabapentin for 7 days.   MRI negative for acute abnormality.  Went to reassess patient, she had no complaints including no pain.  Her  Neck was fully rangable making meningitis  Unlikely.  MRI without any abnormality, unlikely bleed or stroke. Will discharged on gabapentin per neurology recommendations. She has PCP should followup with next week.  Return to ED as needed.  Discharged.   Sol Passer, MD 06/24/14 [phone]

## 2014-06-24 NOTE — ED Provider Notes (Signed)
CSN: [id number]     Arrival date & time 06/24/14  1127 History   First MD Initiated Contact with Patient 06/24/14 1142     Chief Complaint  Patient presents with  . Headache     (Consider location/radiation/quality/duration/timing/severity/associated sxs/prior Treatment) HPI Andrea Berry is a 48 y.o. female  With history of hypertension, lupus, CVA resulting in blindness, depression, presents to emergency department with complaint of intermittent headaches and face tightness. Patient states she started having headaches about a week ago. States  They come and go. Pain is mainly around left eye and left head. Reports associated photophobia  And phonophobia. States also yesterday developed a sensation of face  Tightness. States has sensation of someone pulling her lip to the right. She describes it as "it's like someone has a needle threaded through" her mom offers pulling it to the right." I this time patient denies any headache. States she has no face numbness. She does however have sensation of someone pulling on her mouth. She does have distant history of migraines, but states these do not feel the same. She denies any nasal congestion. She states she did have some problems with the left eye a week ago and was seen by an ophthalmologist, states was given eyedrops. She denies any numbness or weakness in extremities. She denies any difficulty walking. No problems with her memory or coordination.  Past Medical History  Diagnosis Date  . Hypertension   . Blind   . Lupus   . Kidney stones   . Shortness of breath     due to medication  . Depression   . GERD (gastroesophageal reflux disease)   . Headache(784.0)   . Arthritis   . Stroke 2008    visually impaired   Past Surgical History  Procedure Laterality Date  . Tubal ligation    . Tubal ligation    . Breast biopsy     Family History  Problem Relation Age of Onset  . Cancer Mother   . Cancer Father    History  Substance Use  Topics  . Smoking status: Never Smoker   . Smokeless tobacco: Not on file  . Alcohol Use: No   OB History   Grav Para Term Preterm Abortions TAB SAB Ect Mult Living   3 3 3       3      Review of Systems  Constitutional: Negative for fever and chills.  Respiratory: Negative for cough, chest tightness and shortness of breath.   Cardiovascular: Negative for chest pain, palpitations and leg swelling.  Gastrointestinal: Negative for nausea, vomiting, abdominal pain and diarrhea.  Genitourinary: Negative for dysuria.  Musculoskeletal: Negative for arthralgias, myalgias, neck pain and neck stiffness.  Skin: Negative for rash.  Neurological: Positive for numbness and headaches. Negative for dizziness and weakness.  All other systems reviewed and are negative.     Allergies  Review of patient's allergies indicates no known allergies.  Home Medications   Prior to Admission medications   Medication Sig Start Date End Date Taking? Authorizing Provider  azaTHIOprine (IMURAN) 50 MG tablet Take 50 mg by mouth daily.  02/10/13   Historical Provider, MD  baclofen (LIORESAL) 10 MG tablet Take 10 mg by mouth 2 (two) times daily.    Historical Provider, MD  diclofenac sodium (VOLTAREN) 1 % GEL Apply 2 g topically daily as needed (for pain).     Historical Provider, MD  ibandronate (BONIVA) 150 MG tablet Take 150 mg by mouth every 30 (  thirty) days.  02/27/13   Historical Provider, MD  LORazepam (ATIVAN) 0.5 MG tablet Take 0.5 mg by mouth every 8 (eight) hours as needed for anxiety.  02/10/13   Historical Provider, MD  meloxicam (MOBIC) 7.5 MG tablet Take 7.5 mg by mouth daily.    Historical Provider, MD  methocarbamol (ROBAXIN) 500 MG tablet Take 500 mg by mouth 2 (two) times daily.    Historical Provider, MD  metroNIDAZOLE (FLAGYL) 500 MG tablet Take 1 tablet (500 mg total) by mouth 2 (two) times daily. 01/30/14   Amy Thereasa Parkin, CNM  Multiple Vitamin (MULTIVITAMIN) capsule Take 1 capsule by mouth  daily.    Historical Provider, MD  omeprazole (PRILOSEC) 20 MG capsule Take 20 mg by mouth daily.  01/18/13   Historical Provider, MD  predniSONE (DELTASONE) 10 MG tablet Take 10 mg by mouth daily.    Historical Provider, MD  pregabalin (LYRICA) 50 MG capsule Take 50 mg by mouth 3 (three) times daily.    Historical Provider, MD  tamsulosin (FLOMAX) 0.4 MG CAPS capsule Take 1 capsule (0.4 mg total) by mouth daily. 02/08/14   Ardis Hughs, MD   BP 137/79  Pulse 92  Temp(Src) 98.1 F (36.7 C) (Oral)  Resp 20  SpO2 100% Physical Exam  Nursing note and vitals reviewed. Constitutional: She is oriented to person, place, and time. She appears well-developed and well-nourished. No distress.  HENT:  Head: Normocephalic.  Eyes: Conjunctivae and EOM are normal. Pupils are equal, round, and reactive to light.  Pupils dilated  Neck: Normal range of motion. Neck supple.  Cardiovascular: Normal rate, regular rhythm and normal heart sounds.   Pulmonary/Chest: Effort normal and breath sounds normal. No respiratory distress. She has no wheezes. She has no rales.  Abdominal: Soft. Bowel sounds are normal. She exhibits no distension. There is no tenderness. There is no rebound.  Musculoskeletal: She exhibits no edema.  Neurological: She is alert and oriented to person, place, and time.  5/5 and equal upper and lower extremity strength bilaterally. Equal grip strength bilaterally. Normal finger to nose and heel to shin. No pronator drift. Normal sensation in face, bilateral upper and lower extremities.   Skin: Skin is warm and dry.  Psychiatric: She has a normal mood and affect. Her behavior is normal.    ED Course  Procedures (including critical care time) Labs Review Labs Reviewed  CBC WITH DIFFERENTIAL - Abnormal; Notable for the following:    WBC 3.4 (*)    Hemoglobin 11.4 (*)    HCT 35.7 (*)    MCV 72.1 (*)    MCH 23.0 (*)    All other components within normal limits  COMPREHENSIVE  METABOLIC PANEL - Abnormal; Notable for the following:    Potassium 3.5 (*)    Total Bilirubin 0.2 (*)    GFR calc non Af Amer 83 (*)    All other components within normal limits  URINALYSIS, ROUTINE W REFLEX MICROSCOPIC    Imaging Review No results found.   EKG Interpretation   Date/Time:  Thursday June 24 2014 11:42:47 EDT Ventricular Rate:  97 PR Interval:  152 QRS Duration: 79 QT Interval:  321 QTC Calculation: 408 R Axis:   79 Text Interpretation:  Sinus rhythm Probable left atrial enlargement Repol  abnrm suggests ischemia, anterolateral No old tracing to compare Abnormal  ekg Confirmed by Sabra Heck  MD, BRIAN ([id number]) on 06/24/2014 11:53:29 AM      MDM   Final diagnoses:  Other headache  syndrome    1:39 PM intraocular pressures 16, 18, 15. Pt's CT negative. Discussed with Dr. Leonel Ramsay, recommended MR brain. Pt continues to be headache free. Normal neurological exam.   Filed Vitals:   06/24/14 1135 06/24/14 1144  BP: 150/85 137/79  Pulse: 96 92  Temp: 98.1 F (36.7 C)   TempSrc: Oral   Resp: 18 20  SpO2: 100% 100%    4:00 PM Pt signed out to oncoming shift pending MR  Brain. If negative, home with gabapentin, follow up with neurology. Pt neurologically intact.  No headache at this time.    Filed Vitals:   06/24/14 1454 06/24/14 1633 06/24/14 1645 06/24/14 1715  BP: 117/69 140/83 134/86 117/69  Pulse: 74 79 80 80  Temp:      TempSrc:      Resp: 18 16 23 20   SpO2: 100% 100% 99% 99%     Renold Genta, PA-C 06/25/14 [phone]

## 2014-06-24 NOTE — ED Notes (Signed)
MD at bedside. 

## 2014-06-24 NOTE — ED Notes (Signed)
Attempted IV start X2, will talk to provider about medication route.

## 2014-06-27 NOTE — ED Provider Notes (Signed)
Pt with headache, multiple comorbities including lupus, has occaisonal headaches - on exam has normal neuro at baseline - has significant blindness.  W/u initiated to r/o other sources of patients headache.  She is in agreement with plan.  meds ordered, pt has no fever and no neck stiffness, she has no ttp focally over the temporal artery on my exam.  Medical screening examination/treatment/procedure(s) were conducted as a shared visit with non-physician practitioner(s) and myself.  I personally evaluated the patient during the encounter.  Clinical Impression:   Final diagnoses:  Other headache syndrome         Johnna Acosta, MD 06/27/14 [phone]

## 2014-07-12 ENCOUNTER — Encounter (HOSPITAL_COMMUNITY): Payer: Self-pay | Admitting: Emergency Medicine

## 2014-11-10 ENCOUNTER — Other Ambulatory Visit: Payer: Self-pay

## 2014-11-10 DIAGNOSIS — Z1231 Encounter for screening mammogram for malignant neoplasm of breast: Secondary | ICD-10-CM

## 2014-11-12 ENCOUNTER — Ambulatory Visit
Admission: RE | Admit: 2014-11-12 | Discharge: 2014-11-12 | Disposition: A | Discharge status: Home / Self Care | Payer: Medicaid Other | Source: Ambulatory Visit

## 2014-11-12 DIAGNOSIS — Z1231 Encounter for screening mammogram for malignant neoplasm of breast: Secondary | ICD-10-CM

## 2014-11-15 ENCOUNTER — Other Ambulatory Visit: Payer: Self-pay | Admitting: Nurse Practitioner

## 2014-11-15 DIAGNOSIS — R928 Other abnormal and inconclusive findings on diagnostic imaging of breast: Secondary | ICD-10-CM

## 2014-11-19 ENCOUNTER — Ambulatory Visit
Admission: RE | Admit: 2014-11-19 | Discharge: 2014-11-19 | Disposition: A | Discharge status: Home / Self Care | Payer: Medicare Other | Source: Ambulatory Visit | Attending: Nurse Practitioner | Admitting: Nurse Practitioner

## 2014-11-19 DIAGNOSIS — R928 Other abnormal and inconclusive findings on diagnostic imaging of breast: Secondary | ICD-10-CM

## 2015-09-11 DIAGNOSIS — I2699 Other pulmonary embolism without acute cor pulmonale: Secondary | ICD-10-CM

## 2015-09-11 HISTORY — DX: Other pulmonary embolism without acute cor pulmonale: I26.99

## 2015-09-21 ENCOUNTER — Emergency Department (HOSPITAL_COMMUNITY): Payer: Medicare Other

## 2015-09-21 ENCOUNTER — Encounter (HOSPITAL_COMMUNITY): Payer: Self-pay

## 2015-09-21 ENCOUNTER — Inpatient Hospital Stay (HOSPITAL_COMMUNITY)
Admission: EM | Admit: 2015-09-21 | Discharge: 2015-10-06 | DRG: 099 | Disposition: A | Discharge status: Skilled Nursing Facility | Payer: Medicare Other | Attending: Internal Medicine | Admitting: Internal Medicine

## 2015-09-21 DIAGNOSIS — Z87442 Personal history of urinary calculi: Secondary | ICD-10-CM

## 2015-09-21 DIAGNOSIS — R739 Hyperglycemia, unspecified: Secondary | ICD-10-CM | POA: Diagnosis not present

## 2015-09-21 DIAGNOSIS — G459 Transient cerebral ischemic attack, unspecified: Secondary | ICD-10-CM | POA: Diagnosis present

## 2015-09-21 DIAGNOSIS — K219 Gastro-esophageal reflux disease without esophagitis: Secondary | ICD-10-CM | POA: Diagnosis present

## 2015-09-21 DIAGNOSIS — M329 Systemic lupus erythematosus, unspecified: Secondary | ICD-10-CM | POA: Diagnosis present

## 2015-09-21 DIAGNOSIS — F39 Unspecified mood [affective] disorder: Secondary | ICD-10-CM

## 2015-09-21 DIAGNOSIS — F419 Anxiety disorder, unspecified: Secondary | ICD-10-CM | POA: Diagnosis present

## 2015-09-21 DIAGNOSIS — I4581 Long QT syndrome: Secondary | ICD-10-CM | POA: Diagnosis present

## 2015-09-21 DIAGNOSIS — E860 Dehydration: Secondary | ICD-10-CM | POA: Diagnosis present

## 2015-09-21 DIAGNOSIS — R262 Difficulty in walking, not elsewhere classified: Secondary | ICD-10-CM | POA: Diagnosis present

## 2015-09-21 DIAGNOSIS — D72829 Elevated white blood cell count, unspecified: Secondary | ICD-10-CM | POA: Insufficient documentation

## 2015-09-21 DIAGNOSIS — G360 Neuromyelitis optica [Devic]: Secondary | ICD-10-CM

## 2015-09-21 DIAGNOSIS — I959 Hypotension, unspecified: Secondary | ICD-10-CM | POA: Diagnosis present

## 2015-09-21 DIAGNOSIS — Z7952 Long term (current) use of systemic steroids: Secondary | ICD-10-CM

## 2015-09-21 DIAGNOSIS — M542 Cervicalgia: Secondary | ICD-10-CM | POA: Diagnosis present

## 2015-09-21 DIAGNOSIS — H548 Legal blindness, as defined in USA: Secondary | ICD-10-CM | POA: Diagnosis present

## 2015-09-21 DIAGNOSIS — Z8673 Personal history of transient ischemic attack (TIA), and cerebral infarction without residual deficits: Secondary | ICD-10-CM | POA: Diagnosis present

## 2015-09-21 DIAGNOSIS — M1990 Unspecified osteoarthritis, unspecified site: Secondary | ICD-10-CM | POA: Diagnosis present

## 2015-09-21 DIAGNOSIS — Z79899 Other long term (current) drug therapy: Secondary | ICD-10-CM

## 2015-09-21 DIAGNOSIS — F329 Major depressive disorder, single episode, unspecified: Secondary | ICD-10-CM | POA: Diagnosis present

## 2015-09-21 DIAGNOSIS — R079 Chest pain, unspecified: Secondary | ICD-10-CM | POA: Diagnosis present

## 2015-09-21 DIAGNOSIS — R001 Bradycardia, unspecified: Secondary | ICD-10-CM | POA: Diagnosis present

## 2015-09-21 DIAGNOSIS — G373 Acute transverse myelitis in demyelinating disease of central nervous system: Secondary | ICD-10-CM | POA: Diagnosis not present

## 2015-09-21 DIAGNOSIS — M5412 Radiculopathy, cervical region: Secondary | ICD-10-CM

## 2015-09-21 DIAGNOSIS — T380X5A Adverse effect of glucocorticoids and synthetic analogues, initial encounter: Secondary | ICD-10-CM | POA: Diagnosis not present

## 2015-09-21 DIAGNOSIS — I10 Essential (primary) hypertension: Secondary | ICD-10-CM

## 2015-09-21 DIAGNOSIS — Z23 Encounter for immunization: Secondary | ICD-10-CM

## 2015-09-21 DIAGNOSIS — D696 Thrombocytopenia, unspecified: Secondary | ICD-10-CM | POA: Insufficient documentation

## 2015-09-21 DIAGNOSIS — R531 Weakness: Secondary | ICD-10-CM | POA: Diagnosis not present

## 2015-09-21 HISTORY — DX: Unspecified mood (affective) disorder: F39

## 2015-09-21 HISTORY — DX: Essential (primary) hypertension: I10

## 2015-09-21 HISTORY — DX: Personal history of transient ischemic attack (TIA), and cerebral infarction without residual deficits: Z86.73

## 2015-09-21 LAB — CBC
HEMATOCRIT: 42.1 % (ref 36.0–46.0)
Hemoglobin: 13.7 g/dL (ref 12.0–15.0)
MCH: 23.5 pg — AB (ref 26.0–34.0)
MCHC: 32.5 g/dL (ref 30.0–36.0)
MCV: 72.1 fL — AB (ref 78.0–100.0)
PLATELETS: 251 10*3/uL (ref 150–400)
RBC: 5.84 MIL/uL — ABNORMAL HIGH (ref 3.87–5.11)
RDW: 14.3 % (ref 11.5–15.5)
WBC: 3.2 10*3/uL — AB (ref 4.0–10.5)

## 2015-09-21 LAB — I-STAT CHEM 8, ED
BUN: 18 mg/dL (ref 6–20)
CREATININE: 0.8 mg/dL (ref 0.44–1.00)
Calcium, Ion: 1.13 mmol/L (ref 1.12–1.23)
Chloride: 106 mmol/L (ref 101–111)
GLUCOSE: 88 mg/dL (ref 65–99)
HCT: 48 % — ABNORMAL HIGH (ref 36.0–46.0)
HEMOGLOBIN: 16.3 g/dL — AB (ref 12.0–15.0)
Potassium: 5.4 mmol/L — ABNORMAL HIGH (ref 3.5–5.1)
Sodium: 137 mmol/L (ref 135–145)
TCO2: 22 mmol/L (ref 0–100)

## 2015-09-21 LAB — COMPREHENSIVE METABOLIC PANEL
ALT: 48 U/L (ref 14–54)
AST: 63 U/L — AB (ref 15–41)
Albumin: 4.4 g/dL (ref 3.5–5.0)
Alkaline Phosphatase: 54 U/L (ref 38–126)
Anion gap: 12 (ref 5–15)
BILIRUBIN TOTAL: 1.2 mg/dL (ref 0.3–1.2)
BUN: 12 mg/dL (ref 6–20)
CHLORIDE: 104 mmol/L (ref 101–111)
CO2: 19 mmol/L — ABNORMAL LOW (ref 22–32)
CREATININE: 0.8 mg/dL (ref 0.44–1.00)
Calcium: 10.1 mg/dL (ref 8.9–10.3)
Glucose, Bld: 91 mg/dL (ref 65–99)
POTASSIUM: 4.7 mmol/L (ref 3.5–5.1)
Sodium: 135 mmol/L (ref 135–145)
TOTAL PROTEIN: 8.7 g/dL — AB (ref 6.5–8.1)

## 2015-09-21 LAB — RAPID URINE DRUG SCREEN, HOSP PERFORMED
AMPHETAMINES: NOT DETECTED
Barbiturates: NOT DETECTED
Benzodiazepines: POSITIVE — AB
COCAINE: NOT DETECTED
OPIATES: NOT DETECTED
TETRAHYDROCANNABINOL: NOT DETECTED

## 2015-09-21 LAB — URINALYSIS, ROUTINE W REFLEX MICROSCOPIC
Glucose, UA: NEGATIVE mg/dL
Hgb urine dipstick: NEGATIVE
Ketones, ur: 15 mg/dL — AB
Nitrite: NEGATIVE
PROTEIN: NEGATIVE mg/dL
SPECIFIC GRAVITY, URINE: 1.023 (ref 1.005–1.030)
pH: 5.5 (ref 5.0–8.0)

## 2015-09-21 LAB — URINE MICROSCOPIC-ADD ON

## 2015-09-21 LAB — DIFFERENTIAL
BASOS PCT: 0 %
Basophils Absolute: 0 10*3/uL (ref 0.0–0.1)
EOS ABS: 0 10*3/uL (ref 0.0–0.7)
EOS PCT: 0 %
Lymphocytes Relative: 26 %
Lymphs Abs: 0.8 10*3/uL (ref 0.7–4.0)
MONO ABS: 0.4 10*3/uL (ref 0.1–1.0)
Monocytes Relative: 11 %
NEUTROS ABS: 2 10*3/uL (ref 1.7–7.7)
Neutrophils Relative %: 63 %

## 2015-09-21 LAB — I-STAT TROPONIN, ED: Troponin i, poc: 0.01 ng/mL (ref 0.00–0.08)

## 2015-09-21 LAB — I-STAT CG4 LACTIC ACID, ED: LACTIC ACID, VENOUS: 1.95 mmol/L (ref 0.5–2.0)

## 2015-09-21 LAB — ETHANOL: Alcohol, Ethyl (B): <5 mg/dL (ref <5)

## 2015-09-21 MED ORDER — SODIUM CHLORIDE 0.9 % IV SOLN
1000.0000 mg | Freq: Once | INTRAVENOUS | Status: AC
  Administered 2015-09-22: 1000 mg via INTRAVENOUS
  Filled 2015-09-21: qty 8

## 2015-09-21 MED ORDER — MORPHINE SULFATE (PF) 4 MG/ML IV SOLN
4.0000 mg | Freq: Once | INTRAVENOUS | Status: AC
  Administered 2015-09-21: 4 mg via INTRAVENOUS
  Filled 2015-09-21: qty 1

## 2015-09-21 MED ORDER — LORAZEPAM 2 MG/ML IJ SOLN
0.5000 mg | Freq: Once | INTRAMUSCULAR | Status: AC
  Administered 2015-09-21: 0.5 mg via INTRAVENOUS
  Filled 2015-09-21: qty 1

## 2015-09-21 MED ORDER — LORAZEPAM 2 MG/ML IJ SOLN
1.0000 mg | Freq: Once | INTRAMUSCULAR | Status: AC
  Administered 2015-09-21: 1 mg via INTRAVENOUS
  Filled 2015-09-21: qty 1

## 2015-09-21 MED ORDER — SODIUM CHLORIDE 0.9 % IV BOLUS (SEPSIS)
1000.0000 mL | Freq: Once | INTRAVENOUS | Status: AC
  Administered 2015-09-21: 1000 mL via INTRAVENOUS

## 2015-09-21 MED ORDER — FENTANYL CITRATE (PF) 100 MCG/2ML IJ SOLN
50.0000 ug | Freq: Once | INTRAMUSCULAR | Status: AC
  Administered 2015-09-21: 50 ug via INTRAVENOUS
  Filled 2015-09-21: qty 2

## 2015-09-21 MED ORDER — FENTANYL CITRATE (PF) 100 MCG/2ML IJ SOLN
100.0000 ug | Freq: Once | INTRAMUSCULAR | Status: AC
  Administered 2015-09-21: 100 ug via INTRAVENOUS
  Filled 2015-09-21: qty 2

## 2015-09-21 NOTE — ED Notes (Signed)
Pt. Coming from home via [facility] c/o left sided arm weakness and the loss of appetite for the last 2 days. Pt. Hx of lupus and is legally blind bilaterally. Pt. Also c/o anxiousness at this time. Pt. Vitals WDL en route. CBG 95. Pt. Claims left arm is numb, but also is painful. Denies tingling sensation. Upon pt. Arrival c/o neck pain. EDP at bedside.

## 2015-09-21 NOTE — ED Provider Notes (Signed)
CSN: [id number]     Arrival date & time 09/21/15  1356 History   First MD Initiated Contact with Patient 09/21/15 1404     Chief Complaint  Patient presents with  . Extremity Weakness     (Consider location/radiation/quality/duration/timing/severity/associated sxs/prior Treatment) HPI Comments: 50 year old female with history of reflux, blindness, nonsmoker, stroke secondary to lupus presents with left-sided weakness and numbness along with neck pain bilateral. Patient symptoms started yesterday evening with left-sided weakness and numbness. This is new for patient. Nation has mild bilateral neck pain on the sides. Patient is blind.  Patient is a 50 y.o. female presenting with extremity weakness. The history is provided by the patient.  Extremity Weakness This is a new problem. Pertinent negatives include no chest pain, no abdominal pain, no headaches and no shortness of breath.    Past Medical History  Diagnosis Date  . Hypertension   . Blind   . Lupus ([place])   . Kidney stones   . Shortness of breath     due to medication  . Depression   . GERD (gastroesophageal reflux disease)   . Headache(784.0)   . Arthritis   . Stroke [facility]) 2008    visually impaired   Past Surgical History  Procedure Laterality Date  . Tubal ligation    . Tubal ligation    . Breast biopsy     Family History  Problem Relation Age of Onset  . Cancer Mother   . Cancer Father    Social History  Substance Use Topics  . Smoking status: Never Smoker   . Smokeless tobacco: None  . Alcohol Use: No   OB History    Gravida Para Term Preterm AB TAB SAB Ectopic Multiple Living   3 3 3       3      Review of Systems  Constitutional: Negative for fever and chills.  HENT: Negative for congestion.   Eyes: Positive for visual disturbance.  Respiratory: Negative for shortness of breath.   Cardiovascular: Negative for chest pain.  Gastrointestinal: Negative for vomiting and abdominal pain.  Genitourinary:  Negative for dysuria and flank pain.  Musculoskeletal: Positive for extremity weakness and neck pain. Negative for back pain and neck stiffness.  Skin: Negative for rash.  Neurological: Positive for weakness and numbness. Negative for light-headedness and headaches.      Allergies  Review of patient's allergies indicates no known allergies.  Home Medications   Prior to Admission medications   Medication Sig Start Date End Date Taking? Authorizing Provider  azaTHIOprine (IMURAN) 50 MG tablet Take 50 mg by mouth daily.  02/10/13  Yes Historical Provider, MD  ibandronate (BONIVA) 150 MG tablet Take 150 mg by mouth every 30 (thirty) days.  02/27/13  Yes Historical Provider, MD  LORazepam (ATIVAN) 0.5 MG tablet Take 0.5 mg by mouth every 8 (eight) hours as needed for anxiety.  02/10/13  Yes Historical Provider, MD  Multiple Vitamin (MULTIVITAMIN) capsule Take 1 capsule by mouth daily.   Yes Historical Provider, MD  omeprazole (PRILOSEC) 20 MG capsule Take 20 mg by mouth daily.  01/18/13  Yes Historical Provider, MD  predniSONE (DELTASONE) 5 MG tablet Take 5 mg by mouth daily with breakfast.   Yes Historical Provider, MD  pregabalin (LYRICA) 50 MG capsule Take 50 mg by mouth 3 (three) times daily.   Yes Historical Provider, MD   BP 127/109 mmHg  Pulse 117  Temp(Src) 98.8 F (37.1 C) (Oral)  Resp 27  Ht 5\' 1"  (  1.549 m)  Wt 175 lb (79.379 kg)  BMI 33.08 kg/m2  SpO2 100% Physical Exam  Constitutional: She is oriented to person, place, and time. She appears well-developed and well-nourished.  HENT:  Head: Normocephalic and atraumatic.  Mild dry mucous membranes  Eyes: Conjunctivae are normal. Right eye exhibits no discharge. Left eye exhibits no discharge.  Neck: Normal range of motion. Neck supple. No tracheal deviation present.  Cardiovascular: Regular rhythm.  Tachycardia present.   Pulmonary/Chest: Effort normal and breath sounds normal.  Abdominal: Soft. She exhibits no distension.  There is no tenderness. There is no guarding.  Musculoskeletal: She exhibits tenderness. She exhibits no edema.  Patient has mild paraspinal tenderness cervical no meningismus.  Neurological: She is alert and oriented to person, place, and time. No cranial nerve deficit.  Patient has mild left arm drift, 4-5 left arm strength versus right 5 out of 5, mild decreased strength left leg versus right leg. Less sensation left side versus right.  Skin: Skin is warm. No rash noted.  Psychiatric: Her mood appears anxious.  Nursing note and vitals reviewed.   ED Course  Procedures (including critical care time) Labs Review Labs Reviewed  CBC - Abnormal; Notable for the following:    WBC 3.2 (*)    RBC 5.84 (*)    MCV 72.1 (*)    MCH 23.5 (*)    All other components within normal limits  I-STAT CHEM 8, ED - Abnormal; Notable for the following:    Potassium 5.4 (*)    Hemoglobin 16.3 (*)    HCT 48.0 (*)    All other components within normal limits  ETHANOL  DIFFERENTIAL  COMPREHENSIVE METABOLIC PANEL  URINE RAPID DRUG SCREEN, HOSP PERFORMED  URINALYSIS, ROUTINE W REFLEX MICROSCOPIC (NOT AT [facility])  I-STAT TROPOININ, ED    Imaging Review No results found. I have personally reviewed and evaluated these images and lab results as part of my medical decision-making.   EKG Interpretation   Date/Time:  Wednesday September 21 2015 14:26:06 EST Ventricular Rate:  116 PR Interval:  140 QRS Duration: 80 QT Interval:  414 QTC Calculation: 575 R Axis:   66 Text Interpretation:  Sinus tachycardia Probable LVH with secondary repol  abnrm Prolonged QT interval overall similar previous tachycardia Confirmed  by Garren Greenman  MD, Nadyne Gariepy ([id number]) on 09/21/2015 2:32:53 PM      MDM   Final diagnoses:  None   Patient presents with left-sided weakness since yesterday evening. Patient is not a candidate for TPA at this time. Patient does have lupus/vasculitis history. Discussed with neurology on-call after  arrival, the recommended MRI head and neck its patient is out of the window, concern for neck pathology at this time. Plan for pain meds, blood work, IV fluids. Patient has no chest pain or shortness of breath. Neurology requested [place] to call him back after the MRI for final disposition recommendations.  Patient care signed out to Dr. Cathleen Fears to reassess and follow-up MRI results with neuro recs.    Filed Vitals:   09/21/15 1500 09/21/15 1518  BP: 127/109 127/109  Pulse: 112 117  Temp:    Resp: 20 27       Elnora Morrison, MD 09/24/15 [phone]

## 2015-09-21 NOTE — ED Notes (Signed)
Phlebotomy attempted blood draw. Unsuccessful at this time. EDP notified.

## 2015-09-21 NOTE — ED Provider Notes (Signed)
Patient complains of left-sided paracervical neck pain and left arm and left leg weakness onset 2 days ago, gradually. She's had difficulty walking secondary to leg weakness. She denies any leg pain. No loss of bladder or bowel control. Patient is legally blind. On exam she is alert Glasgow Coma Score 15 HEENT exam no facial asymmetry neck no step off no bruit neurologic motor strength in left upper extremity 3 over 5 left lower extremity 4 over 5 right upper extremity 5 over 5 right lower extremity 5 over 5. DTRs 3+ bilaterally at the jerk and ankle jerk and biceps toes downgoing bilaterally Results for orders placed or performed during the hospital encounter of 09/21/15  Ethanol  Result Value Ref Range   Alcohol, Ethyl (B) <5 <5 mg/dL  CBC  Result Value Ref Range   WBC 3.2 (L) 4.0 - 10.5 K/uL   RBC 5.84 (H) 3.87 - 5.11 MIL/uL   Hemoglobin 13.7 12.0 - 15.0 g/dL   HCT 42.1 36.0 - 46.0 %   MCV 72.1 (L) 78.0 - 100.0 fL   MCH 23.5 (L) 26.0 - 34.0 pg   MCHC 32.5 30.0 - 36.0 g/dL   RDW 14.3 11.5 - 15.5 %   Platelets 251 150 - 400 K/uL  Differential  Result Value Ref Range   Neutrophils Relative % 63 %   Lymphocytes Relative 26 %   Monocytes Relative 11 %   Eosinophils Relative 0 %   Basophils Relative 0 %   Neutro Abs 2.0 1.7 - 7.7 K/uL   Lymphs Abs 0.8 0.7 - 4.0 K/uL   Monocytes Absolute 0.4 0.1 - 1.0 K/uL   Eosinophils Absolute 0.0 0.0 - 0.7 K/uL   Basophils Absolute 0.0 0.0 - 0.1 K/uL   Smear Review MORPHOLOGY UNREMARKABLE   Comprehensive metabolic panel  Result Value Ref Range   Sodium 135 135 - 145 mmol/L   Potassium 4.7 3.5 - 5.1 mmol/L   Chloride 104 101 - 111 mmol/L   CO2 19 (L) 22 - 32 mmol/L   Glucose, Bld 91 65 - 99 mg/dL   BUN 12 6 - 20 mg/dL   Creatinine, Ser 0.80 0.44 - 1.00 mg/dL   Calcium 10.1 8.9 - 10.3 mg/dL   Total Protein 8.7 (H) 6.5 - 8.1 g/dL   Albumin 4.4 3.5 - 5.0 g/dL   AST 63 (H) 15 - 41 U/L   ALT 48 14 - 54 U/L   Alkaline Phosphatase 54 38 - 126 U/L    Total Bilirubin 1.2 0.3 - 1.2 mg/dL   GFR calc non Af Amer >60 >60 mL/min   GFR calc Af Amer >60 >60 mL/min   Anion gap 12 5 - 15  I-Stat Chem 8, ED  (not at [facility])  Result Value Ref Range   Sodium 137 135 - 145 mmol/L   Potassium 5.4 (H) 3.5 - 5.1 mmol/L   Chloride 106 101 - 111 mmol/L   BUN 18 6 - 20 mg/dL   Creatinine, Ser 0.80 0.44 - 1.00 mg/dL   Glucose, Bld 88 65 - 99 mg/dL   Calcium, Ion 1.13 1.12 - 1.23 mmol/L   TCO2 22 0 - 100 mmol/L   Hemoglobin 16.3 (H) 12.0 - 15.0 g/dL   HCT 48.0 (H) 36.0 - 46.0 %  I-stat troponin, ED  Result Value Ref Range   Troponin i, poc 0.01 0.00 - 0.08 ng/mL   Comment 3          I-Stat CG4 Lactic Acid, ED  Result Value Ref Range   Lactic Acid, Venous 1.95 0.5 - 2.0 mmol/L   Ct Head Wo Contrast  09/21/2015  CLINICAL DATA:  Acute left-sided weakness. EXAM: CT HEAD WITHOUT CONTRAST TECHNIQUE: Contiguous axial images were obtained from the base of the skull through the vertex without intravenous contrast. COMPARISON:  CT scan of June 24, 2014. FINDINGS: Bony calvarium appears intact. No mass effect or midline shift is noted. Ventricular size is within normal limits. There is no evidence of mass lesion, hemorrhage or acute infarction. IMPRESSION: Normal head CT. Electronically Signed   By: Marijo Conception, M.D.   On: 09/21/2015 16:27   Mr Brain Wo Contrast  09/21/2015  CLINICAL DATA:  Left arm numbness and pain. Neck pain. Stroke versus cervical spine disease. EXAM: MRI HEAD WITHOUT CONTRAST MRI CERVICAL SPINE WITHOUT CONTRAST TECHNIQUE: Multiplanar, multiecho pulse sequences of the brain and surrounding structures, and cervical spine, to include the craniocervical junction and cervicothoracic junction, were obtained without intravenous contrast. COMPARISON:  Head CT earlier same day.  MRI 06/24/2014 FINDINGS: MRI HEAD FINDINGS There is a 1 cm region of T2 and FLAIR abnormality within the left thalamus. No restricted diffusion presently. On the old  MRI, there appear to be an old small vessel insult in this location. Elsewhere, the cerebral hemispheres show a few punctate foci of T2 and FLAIR signal in the deep white matter. No cortical or large vessel territory insult. No mass lesion, hemorrhage, hydrocephalus or extra-axial collection. No pituitary mass. No inflammatory sinus disease. No skull or skullbase lesion. MRI CERVICAL SPINE FINDINGS The cervical spinal cord is diffusely abnormal showing enlargement and inter medullary T2 signal. This extends from the obex all the way to the C7 level. This is consistent with acute deep mild is a shin or myelitis. There are no degenerative changes. There is no compressive pathology. IMPRESSION: 1 cm region of abnormal T2 and FLAIR signal within the left thalamus not consistent with acute infarction. This could be an acute focus of demyelination or conceivably but less likely a late subacute infarction. Diffusely abnormal cervical spinal cord which is swollen and shows diffuse abnormal T2 signal. This is likely to represent acute demyelination/neuromyelitis optica (Devic's disease) or acute transverse myelitis. Quite likely that the findings of the left thalamus in the spinal cord are due to the same pathology. Electronically Signed   By: Nelson Chimes M.D.   On: 09/21/2015 20:41   Mr Cervical Spine Wo Contrast  09/21/2015  CLINICAL DATA:  Left arm numbness and pain. Neck pain. Stroke versus cervical spine disease. EXAM: MRI HEAD WITHOUT CONTRAST MRI CERVICAL SPINE WITHOUT CONTRAST TECHNIQUE: Multiplanar, multiecho pulse sequences of the brain and surrounding structures, and cervical spine, to include the craniocervical junction and cervicothoracic junction, were obtained without intravenous contrast. COMPARISON:  Head CT earlier same day.  MRI 06/24/2014 FINDINGS: MRI HEAD FINDINGS There is a 1 cm region of T2 and FLAIR abnormality within the left thalamus. No restricted diffusion presently. On the old MRI, there  appear to be an old small vessel insult in this location. Elsewhere, the cerebral hemispheres show a few punctate foci of T2 and FLAIR signal in the deep white matter. No cortical or large vessel territory insult. No mass lesion, hemorrhage, hydrocephalus or extra-axial collection. No pituitary mass. No inflammatory sinus disease. No skull or skullbase lesion. MRI CERVICAL SPINE FINDINGS The cervical spinal cord is diffusely abnormal showing enlargement and inter medullary T2 signal. This extends from the obex all the way to  the C7 level. This is consistent with acute deep mild is a shin or myelitis. There are no degenerative changes. There is no compressive pathology. IMPRESSION: 1 cm region of abnormal T2 and FLAIR signal within the left thalamus not consistent with acute infarction. This could be an acute focus of demyelination or conceivably but less likely a late subacute infarction. Diffusely abnormal cervical spinal cord which is swollen and shows diffuse abnormal T2 signal. This is likely to represent acute demyelination/neuromyelitis optica (Devic's disease) or acute transverse myelitis. Quite likely that the findings of the left thalamus in the spinal cord are due to the same pathology. Electronically Signed   By: Nelson Chimes M.D.   On: 09/21/2015 20:41   10:15 PM pain and anxiety improved after treatment with intravenous morphine and Ativan. Intravenous Solu-Medrol ordered a recommendation of neurologist Dr.Sumer. Dr Verner Chol, MD 09/21/15 2218

## 2015-09-21 NOTE — ED Notes (Signed)
Pt remains monitored by blood pressure, pulse ox, and 12 lead.  

## 2015-09-21 NOTE — ED Provider Notes (Signed)
Dr Olevia Bowens consulted for admission and will see pt in the ED  Orlie Dakin, MD 09/21/15 2219

## 2015-09-21 NOTE — ED Notes (Addendum)
MRI called RN to communicate pt. Pain level 8/10 in cervical region. EDP notified and order for analgesics given verbally. Pain medication given by RN at MRI.

## 2015-09-21 NOTE — H&P (Signed)
[facility] History and Physical  Andrea Berry ZOX:[id number] DOB: March 12, 1966 DOA: 09/21/2015  Referring physician: Orlie Dakin PCP: [name], NP   Chief Complaint: Left arm weakness.   HPI: Andrea Berry is a 50 y.o. female with a past medical history of CVA in 2008, legally blind, SLE, hypertension, GERD, urolithiasis, GERD, anxiety, depression who comes to the hospital via EMS due to left-sided weakness since yesterday and loss of appetite for 4 days.   Per patient, since yesterday afternoon she has noticed having weakness and numbness in her left arm. She states that the arm is painful. She states that she stood up to walk, was stumbling and it stays that since then she has also had left lower extremity weakness. Symptoms are associated with headache on the back of her head. She denies speech or language comprehension abnormalities, worsening of vision or double vision, dizziness or dyspnea. She denies chest pain, diaphoresis, pitting edema lower extremities, PND orthopnea. She has had palpitations and worsening of her anxiety since then.  She states that over the past 4 days she has had decreased appetite, with epigastric discomfort associated with mild nausea. She denies fever, chills, emesis, diarrhea, constipation, melena or hematochezia. She states that she feels fatigue.   Review of Systems:  Constitutional:  Positive fatigue.  No weight loss, night sweats, Fevers, chills.  HEENT:  No headaches, Difficulty swallowing,Tooth/dental problems,Sore throat,  No sneezing, itching, ear ache, nasal congestion, post nasal drip,  Cardio-vascular:  Positive palpitations as above mentioned. GI:  Positive nausea, loss of appetite.  No heartburn, indigestion, abdominal pain,  vomiting, diarrhea, change in bowel habits. Resp:  No shortness of breath with exertion or at rest. No excess mucus, no productive cough, No non-productive cough, No coughing up of blood.No change  in color of mucus.No wheezing.No chest wall deformity  Skin:  no rash or lesions.  GU:  no dysuria, change in color of urine, no urgency or frequency. No flank pain.  Musculoskeletal:  No joint pain or swelling. No decreased range of motion. No back pain.  Psych:  Worsening  anxiety.  No memory loss.  Neuro:   as above mentioned.  Past Medical History  Diagnosis Date  . Hypertension   . Blind   . Lupus ([place])   . Kidney stones   . Shortness of breath     due to medication  . Depression   . GERD (gastroesophageal reflux disease)   . Headache(784.0)   . Arthritis   . Stroke [facility]) 2008    visually impaired   Past Surgical History  Procedure Laterality Date  . Tubal ligation    . Tubal ligation    . Breast biopsy     Social History:  reports that she has never smoked. She does not have any smokeless tobacco history on file. She reports that she does not drink alcohol or use illicit drugs.  No Known Allergies  Family History  Problem Relation Age of Onset  . Cancer Mother   . Cancer Father      Prior to Admission medications   Medication Sig Start Date End Date Taking? Authorizing Provider  azaTHIOprine (IMURAN) 50 MG tablet Take 50 mg by mouth daily.  02/10/13  Yes Historical Provider, MD  ibandronate (BONIVA) 150 MG tablet Take 150 mg by mouth every 30 (thirty) days.  02/27/13  Yes Historical Provider, MD  LORazepam (ATIVAN) 0.5 MG tablet Take 0.5 mg by mouth every 8 (eight) hours as needed  for anxiety.  02/10/13  Yes Historical Provider, MD  Multiple Vitamin (MULTIVITAMIN) capsule Take 1 capsule by mouth daily.   Yes Historical Provider, MD  omeprazole (PRILOSEC) 20 MG capsule Take 20 mg by mouth daily.  01/18/13  Yes Historical Provider, MD  predniSONE (DELTASONE) 5 MG tablet Take 5 mg by mouth daily with breakfast.   Yes Historical Provider, MD  pregabalin (LYRICA) 50 MG capsule Take 50 mg by mouth 3 (three) times daily.   Yes Historical Provider, MD   Physical  Exam: Filed Vitals:   09/21/15 1800 09/21/15 1830 09/21/15 2149 09/21/15 2159  BP: 149/94 158/95    Pulse:  120    Temp:   97.9 F (36.6 C) 99.7 F (37.6 C)  TempSrc:    Rectal  Resp: 25 19    Height:      Weight:      SpO2:  100%      Wt Readings from Last 3 Encounters:  09/21/15 79.379 kg (175 lb)  02/08/14 73.199 kg (161 lb 6 oz)  01/29/14 75.297 kg (166 lb)     General: Appears calm and comfortable  Eyes: Legally blind, no light perception on right eye, normal lids, irises & conjunctiva  ENT: grossly normal hearing, lips and oral mucosa are mildly dry.  Neck: no LAD, masses or thyromegaly  Cardiovascular: RRR, no m/r/g. No LE edema.  Telemetry: SR, no arrhythmias   Respiratory: CTA bilaterally, no w/r/r. Normal respiratory effort.  Abdomen: soft, ntnd  Skin: no rash or induration seen on limited exam  Musculoskeletal: grossly normal tone BUE/BLE  Psychiatric: Mildly sedated due to lorazepam and narcotic analgesics.  Neurologic: Awake, alert, oriented 3, 2/5 left-sided hemiparesis.           Labs on Admission:  Basic Metabolic Panel:  Recent Labs Lab 09/21/15 1422 09/21/15 1600  NA 135 137  K 4.7 5.4*  CL 104 106  CO2 19*  --   GLUCOSE 91 88  BUN 12 18  CREATININE 0.80 0.80  CALCIUM 10.1  --    Liver Function Tests:  Recent Labs Lab 09/21/15 1422  AST 63*  ALT 48  ALKPHOS 54  BILITOT 1.2  PROT 8.7*  ALBUMIN 4.4   CBC:  Recent Labs Lab 09/21/15 1422 09/21/15 1600  WBC 3.2*  --   NEUTROABS 2.0  --   HGB 13.7 16.3*  HCT 42.1 48.0*  MCV 72.1*  --   PLT 251  --     Radiological Exams on Admission: Ct Head Wo Contrast  09/21/2015  CLINICAL DATA:  Acute left-sided weakness. EXAM: CT HEAD WITHOUT CONTRAST TECHNIQUE: Contiguous axial images were obtained from the base of the skull through the vertex without intravenous contrast. COMPARISON:  CT scan of June 24, 2014. FINDINGS: Bony calvarium appears intact. No mass effect or  midline shift is noted. Ventricular size is within normal limits. There is no evidence of mass lesion, hemorrhage or acute infarction. IMPRESSION: Normal head CT. Electronically Signed   By: Marijo Conception, M.D.   On: 09/21/2015 16:27   Mr Brain Wo Contrast  09/21/2015  CLINICAL DATA:  Left arm numbness and pain. Neck pain. Stroke versus cervical spine disease. EXAM: MRI HEAD WITHOUT CONTRAST MRI CERVICAL SPINE WITHOUT CONTRAST TECHNIQUE: Multiplanar, multiecho pulse sequences of the brain and surrounding structures, and cervical spine, to include the craniocervical junction and cervicothoracic junction, were obtained without intravenous contrast. COMPARISON:  Head CT earlier same day.  MRI 06/24/2014 FINDINGS: MRI HEAD FINDINGS  There is a 1 cm region of T2 and FLAIR abnormality within the left thalamus. No restricted diffusion presently. On the old MRI, there appear to be an old small vessel insult in this location. Elsewhere, the cerebral hemispheres show a few punctate foci of T2 and FLAIR signal in the deep white matter. No cortical or large vessel territory insult. No mass lesion, hemorrhage, hydrocephalus or extra-axial collection. No pituitary mass. No inflammatory sinus disease. No skull or skullbase lesion. MRI CERVICAL SPINE FINDINGS The cervical spinal cord is diffusely abnormal showing enlargement and inter medullary T2 signal. This extends from the obex all the way to the C7 level. This is consistent with acute deep mild is a shin or myelitis. There are no degenerative changes. There is no compressive pathology. IMPRESSION: 1 cm region of abnormal T2 and FLAIR signal within the left thalamus not consistent with acute infarction. This could be an acute focus of demyelination or conceivably but less likely a late subacute infarction. Diffusely abnormal cervical spinal cord which is swollen and shows diffuse abnormal T2 signal. This is likely to represent acute demyelination/neuromyelitis optica  (Devic's disease) or acute transverse myelitis. Quite likely that the findings of the left thalamus in the spinal cord are due to the same pathology. Electronically Signed   By: Nelson Chimes M.D.   On: 09/21/2015 20:41   Mr Cervical Spine Wo Contrast  09/21/2015  CLINICAL DATA:  Left arm numbness and pain. Neck pain. Stroke versus cervical spine disease. EXAM: MRI HEAD WITHOUT CONTRAST MRI CERVICAL SPINE WITHOUT CONTRAST TECHNIQUE: Multiplanar, multiecho pulse sequences of the brain and surrounding structures, and cervical spine, to include the craniocervical junction and cervicothoracic junction, were obtained without intravenous contrast. COMPARISON:  Head CT earlier same day.  MRI 06/24/2014 FINDINGS: MRI HEAD FINDINGS There is a 1 cm region of T2 and FLAIR abnormality within the left thalamus. No restricted diffusion presently. On the old MRI, there appear to be an old small vessel insult in this location. Elsewhere, the cerebral hemispheres show a few punctate foci of T2 and FLAIR signal in the deep white matter. No cortical or large vessel territory insult. No mass lesion, hemorrhage, hydrocephalus or extra-axial collection. No pituitary mass. No inflammatory sinus disease. No skull or skullbase lesion. MRI CERVICAL SPINE FINDINGS The cervical spinal cord is diffusely abnormal showing enlargement and inter medullary T2 signal. This extends from the obex all the way to the C7 level. This is consistent with acute deep mild is a shin or myelitis. There are no degenerative changes. There is no compressive pathology. IMPRESSION: 1 cm region of abnormal T2 and FLAIR signal within the left thalamus not consistent with acute infarction. This could be an acute focus of demyelination or conceivably but less likely a late subacute infarction. Diffusely abnormal cervical spinal cord which is swollen and shows diffuse abnormal T2 signal. This is likely to represent acute demyelination/neuromyelitis optica (Devic's  disease) or acute transverse myelitis. Quite likely that the findings of the left thalamus in the spinal cord are due to the same pathology. Electronically Signed   By: Nelson Chimes M.D.   On: 09/21/2015 20:41    EKG: Independently reviewed. Vent. rate 116 BPM PR interval 140 ms QRS duration 80 ms QT/QTc 414/575 ms P-R-T axes 21 66 -80 Sinus tachycardia Probable LVH with secondary repol abnrm Prolonged QT interval No significant change when compared to previous tracings.  Assessment/Plan Principal Problem:   TIA (transient ischemic attack) VS CVA Admit to telemetry. Frequent neuro  checks. Neurology suspects Devic's disease. LP for cell count, protein, Gram stain, glucose, oligoclonal bands, IgG index, cytology, VDRL, ACE  (due to patient discomfort neurology requested to be completed under fluoroscopy). Check serum NMO-antibodies, B12, HIV, RPR, TSH, ESR, CRP, ANA, vitamin E Methylprednisolone 1000 mg loading dose in the emergency department and continue per neurology at 563m IVPB BID x 4 days. PT,/OT evaluation.  Check echocardiogram.  Active Problems:   GERD (gastroesophageal reflux disease)  continue proton pump inhibitor.    SLE (systemic lupus erythematosus) (HCC) Continue Imuran. Prednisone has been held since the patient is receiving high-dose IV methylprednisolone.    Hypertension Not on any antihypertensive at the moment. Monitor blood pressure closely.     Anxiety Continue low-dose lorazepam.    Neurology is following the case.   Code Status: Full code. DVT Prophylaxis: Lovenox SQ. Family Communication:  Disposition Plan: Admit to telemetry, perform LP, Devic's disease work up.  Time spent: Over 70 minutes were sopent during the process of this admission.   DReubin Milan[facility] Pager [phone]

## 2015-09-21 NOTE — Consult Note (Signed)
Consult Reason for Consult: left-sided weakness Referring Physician: Dr Winfred [facility]  CC: neck pain and left-sided weakness  HPI: Andrea Berry is an 50 y.o. female with hx of lupus, HTN, bilateral blindness presenting with 2 day history of left-sided paracervical neck pain and left UE and LE weakness causing difficulty walking. Patient notes sharp stabbing pain in left paracervical region, radiating down entire left-side. Also notes nausea and decreased appetite over the past few days.   Developed sudden onset bilateral blindness around 8 years ago. Told it was related to her lupus at that time. Followed by rheumatology, currently on imuran.   MRI brain and C spine imaging reviewed. Pertinent findings include left thalamic T2 signal change concerning for acute demyelination and multilevel cervical cord swelling and T2 signal abnormality concerning for acute demyelination or transverse myelitis.   Past Medical History  Diagnosis Date  . Hypertension   . Blind   . Lupus ([place])   . Kidney stones   . Shortness of breath     due to medication  . Depression   . GERD (gastroesophageal reflux disease)   . Headache(784.0)   . Arthritis   . Stroke [facility]) 2008    visually impaired    Past Surgical History  Procedure Laterality Date  . Tubal ligation    . Tubal ligation    . Breast biopsy      Family History  Problem Relation Age of Onset  . Cancer Mother   . Cancer Father     Social History:  reports that she has never smoked. She does not have any smokeless tobacco history on file. She reports that she does not drink alcohol or use illicit drugs.  No Known Allergies  Medications: I have reviewed the patient's current medications.  ROS: Out of a complete 14 system review, the patient complains of only the following symptoms, and all other reviewed systems are negative. +weakness, pain, numbness  Physical Examination: Filed Vitals:   09/21/15 1800 09/21/15 1830  BP: 149/94  158/95  Pulse:  120  Temp:    Resp: 25 19   Physical Exam  Constitutional: He appears well-developed and well-nourished.  Psych: Affect appropriate to situation Eyes: No scleral injection HENT: No OP obstrucion Head: Normocephalic.  Cardiovascular: Normal rate and regular rhythm.  Respiratory: Effort normal and breath sounds normal.  GI: Soft. Bowel sounds are normal. No distension. There is no tenderness.  Skin: WDI  Neurologic Examination Mental Status: Alert, oriented, thought content appropriate.  Speech fluent without evidence of aphasia.  No dysarthria. Able to follow 3 step commands without difficulty. Cranial Nerves: FU:XNATFT to visualize optic discs, + light perception in OS, no light perception in OD III,IV, VI: ptosis not present, extra-ocular motions intact bilaterally V,VII: smile symmetric, facial light touch sensation normal bilaterally VIII: hearing normal bilaterally IX,X: gag reflex present XI: trapezius strength/neck flexion strength normal bilaterally XII: tongue strength normal  Motor: Right : Upper extremity    Left:     Upper extremity 5/5 deltoid       2/5 deltoid 5/5 biceps      2/5 biceps  5/5 triceps      2/5 triceps 5/5 hand grip      4- hand grip  Lower extremity     Lower extremity 5-/5ip flexor      25 hip flexor 5-5 quadricep      2/5 quadriceps  5-5 hamstrings     2/5 hamstrings 5-5 plantar flexion  4/5 plantar flexion 5-5 plantar extension     3/5 plantar extension Sensory: decreased LT and PP on left side Deep Tendon Reflexes: 3+ and symmetric throughout, 4+ AJs with non-sustained clonus with forced dorsiflexion Plantars: Right: mute   Left: mute Cerebellar: Unable to test on LUE or bilateral LE Gait: deferred  Laboratory Studies:   Basic Metabolic Panel:  Recent Labs Lab 09/21/15 1422 09/21/15 1600  NA 135 137  K 4.7 5.4*  CL 104 106  CO2 19*  --   GLUCOSE 91 88  BUN 12 18  CREATININE 0.80 0.80  CALCIUM 10.1   --     Liver Function Tests:  Recent Labs Lab 09/21/15 1422  AST 63*  ALT 48  ALKPHOS 54  BILITOT 1.2  PROT 8.7*  ALBUMIN 4.4   No results for input(s): LIPASE, AMYLASE in the last 168 hours. No results for input(s): AMMONIA in the last 168 hours.  CBC:  Recent Labs Lab 09/21/15 1422 09/21/15 1600  WBC 3.2*  --   NEUTROABS 2.0  --   HGB 13.7 16.3*  HCT 42.1 48.0*  MCV 72.1*  --   PLT 251  --     Cardiac Enzymes: No results for input(s): CKTOTAL, CKMB, CKMBINDEX, TROPONINI in the last 168 hours.  BNP: Invalid input(s): POCBNP  CBG: No results for input(s): GLUCAP in the last 168 hours.  Microbiology: Results for orders placed or performed in visit on 01/29/14  WET PREP BY MOLECULAR PROBE     Status: Abnormal   Collection Time: 01/29/14  4:21 PM  Result Value Ref Range Status   Candida species NEG Negative Final   Trichomonas vaginosis NEG Negative Final   Gardnerella vaginalis POS (A) Negative Final    Coagulation Studies: No results for input(s): LABPROT, INR in the last 72 hours.  Urinalysis: No results for input(s): COLORURINE, LABSPEC, PHURINE, GLUCOSEU, HGBUR, BILIRUBINUR, KETONESUR, PROTEINUR, UROBILINOGEN, NITRITE, LEUKOCYTESUR in the last 168 hours.  Invalid input(s): APPERANCEUR  Lipid Panel:  No results found for: CHOL, TRIG, HDL, CHOLHDL, VLDL, LDLCALC  HgbA1C: No results found for: HGBA1C  Urine Drug Screen:  No results found for: LABOPIA, COCAINSCRNUR, LABBENZ, AMPHETMU, THCU, LABBARB  Alcohol Level:  Recent Labs Lab 09/21/15 [place] <5    Other results:  Imaging: Ct Head Wo Contrast  09/21/2015  CLINICAL DATA:  Acute left-sided weakness. EXAM: CT HEAD WITHOUT CONTRAST TECHNIQUE: Contiguous axial images were obtained from the base of the skull through the vertex without intravenous contrast. COMPARISON:  CT scan of June 24, 2014. FINDINGS: Bony calvarium appears intact. No mass effect or midline shift is noted. Ventricular  size is within normal limits. There is no evidence of mass lesion, hemorrhage or acute infarction. IMPRESSION: Normal head CT. Electronically Signed   By: Marijo Conception, M.D.   On: 09/21/2015 16:27   Mr Brain Wo Contrast  09/21/2015  CLINICAL DATA:  Left arm numbness and pain. Neck pain. Stroke versus cervical spine disease. EXAM: MRI HEAD WITHOUT CONTRAST MRI CERVICAL SPINE WITHOUT CONTRAST TECHNIQUE: Multiplanar, multiecho pulse sequences of the brain and surrounding structures, and cervical spine, to include the craniocervical junction and cervicothoracic junction, were obtained without intravenous contrast. COMPARISON:  Head CT earlier same day.  MRI 06/24/2014 FINDINGS: MRI HEAD FINDINGS There is a 1 cm region of T2 and FLAIR abnormality within the left thalamus. No restricted diffusion presently. On the old MRI, there appear to be an old small vessel insult in this location. Elsewhere, the  cerebral hemispheres show a few punctate foci of T2 and FLAIR signal in the deep white matter. No cortical or large vessel territory insult. No mass lesion, hemorrhage, hydrocephalus or extra-axial collection. No pituitary mass. No inflammatory sinus disease. No skull or skullbase lesion. MRI CERVICAL SPINE FINDINGS The cervical spinal cord is diffusely abnormal showing enlargement and inter medullary T2 signal. This extends from the obex all the way to the C7 level. This is consistent with acute deep mild is a shin or myelitis. There are no degenerative changes. There is no compressive pathology. IMPRESSION: 1 cm region of abnormal T2 and FLAIR signal within the left thalamus not consistent with acute infarction. This could be an acute focus of demyelination or conceivably but less likely a late subacute infarction. Diffusely abnormal cervical spinal cord which is swollen and shows diffuse abnormal T2 signal. This is likely to represent acute demyelination/neuromyelitis optica (Devic's disease) or acute transverse  myelitis. Quite likely that the findings of the left thalamus in the spinal cord are due to the same pathology. Electronically Signed   By: Nelson Chimes M.D.   On: 09/21/2015 20:41   Mr Cervical Spine Wo Contrast  09/21/2015  CLINICAL DATA:  Left arm numbness and pain. Neck pain. Stroke versus cervical spine disease. EXAM: MRI HEAD WITHOUT CONTRAST MRI CERVICAL SPINE WITHOUT CONTRAST TECHNIQUE: Multiplanar, multiecho pulse sequences of the brain and surrounding structures, and cervical spine, to include the craniocervical junction and cervicothoracic junction, were obtained without intravenous contrast. COMPARISON:  Head CT earlier same day.  MRI 06/24/2014 FINDINGS: MRI HEAD FINDINGS There is a 1 cm region of T2 and FLAIR abnormality within the left thalamus. No restricted diffusion presently. On the old MRI, there appear to be an old small vessel insult in this location. Elsewhere, the cerebral hemispheres show a few punctate foci of T2 and FLAIR signal in the deep white matter. No cortical or large vessel territory insult. No mass lesion, hemorrhage, hydrocephalus or extra-axial collection. No pituitary mass. No inflammatory sinus disease. No skull or skullbase lesion. MRI CERVICAL SPINE FINDINGS The cervical spinal cord is diffusely abnormal showing enlargement and inter medullary T2 signal. This extends from the obex all the way to the C7 level. This is consistent with acute deep mild is a shin or myelitis. There are no degenerative changes. There is no compressive pathology. IMPRESSION: 1 cm region of abnormal T2 and FLAIR signal within the left thalamus not consistent with acute infarction. This could be an acute focus of demyelination or conceivably but less likely a late subacute infarction. Diffusely abnormal cervical spinal cord which is swollen and shows diffuse abnormal T2 signal. This is likely to represent acute demyelination/neuromyelitis optica (Devic's disease) or acute transverse myelitis.  Quite likely that the findings of the left thalamus in the spinal cord are due to the same pathology. Electronically Signed   By: Nelson Chimes M.D.   On: 09/21/2015 20:41     Assessment/Plan:  49y/o hx of lupus, chronic bilateral blindness presenting with 2 day history of cervical neck pain, left-sided weakness, sensory deficits and gait instability. MRI brain and C spine pertinent for longitudinally extensive T2 signal change in the C spine along with possible left thalamic T2 lesion.   History of bilateral blindness and MRI findings raises concern for possible Devic's disease vs other autoimmune or metabolic etiology. Infectious etiology also in the differential though appears less likely.   -LP for cell count, protein, Gram stain, glucose, oligoclonal bands, IgG index, cytology, VDRL,  ACE (due to patient discomfort requested to be completed under fluoroscopy) -serum NMO-antibodies, B12, HIV, RPR, TSH, ESR, CRP, ANA, vitamin E -IV solumedrol 57m BID x 4 days (received 10026malready in ED) -rehab evaluation -will continue to follow   PeJim LikeDO [facility] [phone] 7pm- 7am, please page neurology on call as [place]1/07/2016, 9:26 PM

## 2015-09-21 NOTE — ED Notes (Signed)
Unable to get more than 5cc from IV start. Phlebotomy notified and will draw the rest of the labs.

## 2015-09-21 NOTE — ED Notes (Signed)
Unable to collect urine sample at this time due to patient sleeping and constant need for anti-anxiety and analgesics.

## 2015-09-21 NOTE — ED Notes (Signed)
Neuro dr at bedside

## 2015-09-22 ENCOUNTER — Observation Stay (HOSPITAL_COMMUNITY): Payer: Medicare Other

## 2015-09-22 DIAGNOSIS — Z23 Encounter for immunization: Secondary | ICD-10-CM | POA: Diagnosis not present

## 2015-09-22 DIAGNOSIS — K219 Gastro-esophageal reflux disease without esophagitis: Secondary | ICD-10-CM

## 2015-09-22 DIAGNOSIS — M5412 Radiculopathy, cervical region: Secondary | ICD-10-CM | POA: Insufficient documentation

## 2015-09-22 DIAGNOSIS — M329 Systemic lupus erythematosus, unspecified: Secondary | ICD-10-CM | POA: Diagnosis present

## 2015-09-22 DIAGNOSIS — R531 Weakness: Secondary | ICD-10-CM | POA: Diagnosis present

## 2015-09-22 DIAGNOSIS — T380X5A Adverse effect of glucocorticoids and synthetic analogues, initial encounter: Secondary | ICD-10-CM | POA: Diagnosis not present

## 2015-09-22 DIAGNOSIS — M542 Cervicalgia: Secondary | ICD-10-CM | POA: Diagnosis present

## 2015-09-22 DIAGNOSIS — M1990 Unspecified osteoarthritis, unspecified site: Secondary | ICD-10-CM | POA: Diagnosis present

## 2015-09-22 DIAGNOSIS — Z8673 Personal history of transient ischemic attack (TIA), and cerebral infarction without residual deficits: Secondary | ICD-10-CM | POA: Diagnosis not present

## 2015-09-22 DIAGNOSIS — R001 Bradycardia, unspecified: Secondary | ICD-10-CM | POA: Diagnosis present

## 2015-09-22 DIAGNOSIS — F329 Major depressive disorder, single episode, unspecified: Secondary | ICD-10-CM | POA: Diagnosis present

## 2015-09-22 DIAGNOSIS — F419 Anxiety disorder, unspecified: Secondary | ICD-10-CM | POA: Diagnosis present

## 2015-09-22 DIAGNOSIS — G373 Acute transverse myelitis in demyelinating disease of central nervous system: Principal | ICD-10-CM

## 2015-09-22 DIAGNOSIS — D72829 Elevated white blood cell count, unspecified: Secondary | ICD-10-CM | POA: Diagnosis not present

## 2015-09-22 DIAGNOSIS — I10 Essential (primary) hypertension: Secondary | ICD-10-CM | POA: Diagnosis present

## 2015-09-22 DIAGNOSIS — H548 Legal blindness, as defined in USA: Secondary | ICD-10-CM | POA: Diagnosis present

## 2015-09-22 DIAGNOSIS — Z79899 Other long term (current) drug therapy: Secondary | ICD-10-CM | POA: Diagnosis not present

## 2015-09-22 DIAGNOSIS — G458 Other transient cerebral ischemic attacks and related syndromes: Secondary | ICD-10-CM | POA: Diagnosis not present

## 2015-09-22 DIAGNOSIS — R739 Hyperglycemia, unspecified: Secondary | ICD-10-CM | POA: Diagnosis not present

## 2015-09-22 DIAGNOSIS — R079 Chest pain, unspecified: Secondary | ICD-10-CM | POA: Diagnosis present

## 2015-09-22 DIAGNOSIS — D696 Thrombocytopenia, unspecified: Secondary | ICD-10-CM | POA: Diagnosis present

## 2015-09-22 DIAGNOSIS — I4581 Long QT syndrome: Secondary | ICD-10-CM | POA: Diagnosis present

## 2015-09-22 DIAGNOSIS — I959 Hypotension, unspecified: Secondary | ICD-10-CM | POA: Diagnosis present

## 2015-09-22 DIAGNOSIS — Z7952 Long term (current) use of systemic steroids: Secondary | ICD-10-CM | POA: Diagnosis not present

## 2015-09-22 DIAGNOSIS — Z87442 Personal history of urinary calculi: Secondary | ICD-10-CM | POA: Diagnosis not present

## 2015-09-22 DIAGNOSIS — E860 Dehydration: Secondary | ICD-10-CM | POA: Diagnosis present

## 2015-09-22 DIAGNOSIS — G360 Neuromyelitis optica [Devic]: Secondary | ICD-10-CM | POA: Diagnosis not present

## 2015-09-22 DIAGNOSIS — R262 Difficulty in walking, not elsewhere classified: Secondary | ICD-10-CM | POA: Diagnosis present

## 2015-09-22 LAB — LIPID PANEL
CHOL/HDL RATIO: 4.9 ratio
Cholesterol: 188 mg/dL (ref 0–200)
HDL: 38 mg/dL — AB (ref >40)
LDL Cholesterol: 129 mg/dL — ABNORMAL HIGH (ref 0–99)
TRIGLYCERIDES: 104 mg/dL (ref <150)
VLDL: 21 mg/dL (ref 0–40)

## 2015-09-22 LAB — CSF CELL COUNT WITH DIFFERENTIAL
EOS CSF: 0 % (ref 0–1)
LYMPHS CSF: 20 % — AB (ref 40–80)
Monocyte-Macrophage-Spinal Fluid: 4 % — ABNORMAL LOW (ref 15–45)
RBC COUNT CSF: 44 /mm3 — AB
Segmented Neutrophils-CSF: 76 % — ABNORMAL HIGH (ref 0–6)
TUBE #: 3
WBC, CSF: 10 /mm3 — ABNORMAL HIGH (ref 0–5)

## 2015-09-22 LAB — COMPREHENSIVE METABOLIC PANEL
ALBUMIN: 4 g/dL (ref 3.5–5.0)
ALT: 40 U/L (ref 14–54)
AST: 45 U/L — AB (ref 15–41)
Alkaline Phosphatase: 50 U/L (ref 38–126)
Anion gap: 10 (ref 5–15)
BILIRUBIN TOTAL: 1.8 mg/dL — AB (ref 0.3–1.2)
BUN: 9 mg/dL (ref 6–20)
CO2: 20 mmol/L — AB (ref 22–32)
Calcium: 9.2 mg/dL (ref 8.9–10.3)
Chloride: 107 mmol/L (ref 101–111)
Creatinine, Ser: 0.77 mg/dL (ref 0.44–1.00)
GFR calc Af Amer: >60 mL/min (ref >60)
GFR calc non Af Amer: >60 mL/min (ref >60)
GLUCOSE: 135 mg/dL — AB (ref 65–99)
POTASSIUM: 3.7 mmol/L (ref 3.5–5.1)
SODIUM: 137 mmol/L (ref 135–145)
TOTAL PROTEIN: 7.6 g/dL (ref 6.5–8.1)

## 2015-09-22 LAB — GLUCOSE, CSF: Glucose, CSF: 68 mg/dL (ref 40–70)

## 2015-09-22 LAB — CBG MONITORING, ED
GLUCOSE-CAPILLARY: 152 mg/dL — AB (ref 65–99)
Glucose-Capillary: 143 mg/dL — ABNORMAL HIGH (ref 65–99)

## 2015-09-22 LAB — CBC WITH DIFFERENTIAL/PLATELET
BASOS PCT: 0 %
Basophils Absolute: 0 10*3/uL (ref 0.0–0.1)
EOS PCT: 0 %
Eosinophils Absolute: 0 10*3/uL (ref 0.0–0.7)
HEMATOCRIT: 36.5 % (ref 36.0–46.0)
HEMOGLOBIN: 11.7 g/dL — AB (ref 12.0–15.0)
LYMPHS ABS: 0.3 10*3/uL — AB (ref 0.7–4.0)
LYMPHS PCT: 6 %
MCH: 22.9 pg — AB (ref 26.0–34.0)
MCHC: 32.1 g/dL (ref 30.0–36.0)
MCV: 71.3 fL — AB (ref 78.0–100.0)
MONOS PCT: 4 %
Monocytes Absolute: 0.2 10*3/uL (ref 0.1–1.0)
NEUTROS ABS: 4.3 10*3/uL (ref 1.7–7.7)
Neutrophils Relative %: 90 %
Platelets: 236 10*3/uL (ref 150–400)
RBC: 5.12 MIL/uL — ABNORMAL HIGH (ref 3.87–5.11)
RDW: 14 % (ref 11.5–15.5)
WBC: 4.8 10*3/uL (ref 4.0–10.5)

## 2015-09-22 LAB — CREATININE, SERUM
Creatinine, Ser: 0.8 mg/dL (ref 0.44–1.00)
GFR calc Af Amer: >60 mL/min (ref >60)

## 2015-09-22 LAB — C-REACTIVE PROTEIN

## 2015-09-22 LAB — SEDIMENTATION RATE: Sed Rate: 31 mm/hr — ABNORMAL HIGH (ref 0–22)

## 2015-09-22 LAB — HIV ANTIBODY (ROUTINE TESTING W REFLEX): HIV SCREEN 4TH GENERATION: NONREACTIVE

## 2015-09-22 LAB — TSH
TSH: 3.957 u[IU]/mL (ref 0.350–4.500)
TSH: 0.68 u[IU]/mL (ref 0.350–4.500)

## 2015-09-22 LAB — PROTEIN, CSF: Total  Protein, CSF: 87 mg/dL — ABNORMAL HIGH (ref 15–45)

## 2015-09-22 LAB — VITAMIN B12: VITAMIN B 12: 361 pg/mL (ref 180–914)

## 2015-09-22 LAB — MAGNESIUM: Magnesium: 1.8 mg/dL (ref 1.7–2.4)

## 2015-09-22 LAB — PHOSPHORUS: Phosphorus: 2.9 mg/dL (ref 2.5–4.6)

## 2015-09-22 LAB — RPR: RPR Ser Ql: NONREACTIVE

## 2015-09-22 LAB — GLUCOSE, CAPILLARY: GLUCOSE-CAPILLARY: 129 mg/dL — AB (ref 65–99)

## 2015-09-22 MED ORDER — GADOBENATE DIMEGLUMINE 529 MG/ML IV SOLN
15.0000 mL | Freq: Once | INTRAVENOUS | Status: AC
  Administered 2015-09-22: 15 mL via INTRAVENOUS

## 2015-09-22 MED ORDER — SODIUM CHLORIDE 0.9 % IV SOLN
Freq: Once | INTRAVENOUS | Status: AC
  Administered 2015-09-23 (×3): via INTRAVENOUS_CENTRAL
  Filled 2015-09-22 (×4): qty 200

## 2015-09-22 MED ORDER — ACD FORMULA A 0.73-2.45-2.2 GM/100ML VI SOLN
500.0000 mL | Status: DC
  Filled 2015-09-22: qty 500

## 2015-09-22 MED ORDER — SODIUM CHLORIDE 0.9 % IV SOLN
4.0000 g | Freq: Once | INTRAVENOUS | Status: AC
  Administered 2015-09-23: 4 g via INTRAVENOUS
  Filled 2015-09-22 (×2): qty 40

## 2015-09-22 MED ORDER — DIPHENHYDRAMINE HCL 25 MG PO CAPS
25.0000 mg | ORAL_CAPSULE | Freq: Four times a day (QID) | ORAL | Status: DC | PRN
  Administered 2015-09-23 – 2015-09-24 (×4): 25 mg via ORAL
  Filled 2015-09-22 (×2): qty 1

## 2015-09-22 MED ORDER — ENOXAPARIN SODIUM 40 MG/0.4ML ~~LOC~~ SOLN: 40.0000 mg | Freq: Every day | SUBCUTANEOUS | Status: DC

## 2015-09-22 MED ORDER — SODIUM CHLORIDE 0.9 % IV SOLN
INTRAVENOUS | Status: DC
  Administered 2015-09-22 – 2015-09-23 (×2): via INTRAVENOUS

## 2015-09-22 MED ORDER — ONDANSETRON HCL 4 MG PO TABS: 4.0000 mg | ORAL_TABLET | Freq: Four times a day (QID) | ORAL | Status: DC | PRN

## 2015-09-22 MED ORDER — PANTOPRAZOLE SODIUM 40 MG PO TBEC
40.0000 mg | DELAYED_RELEASE_TABLET | Freq: Every day | ORAL | Status: DC
  Administered 2015-09-22 – 2015-10-06 (×15): 40 mg via ORAL
  Filled 2015-09-22 (×15): qty 1

## 2015-09-22 MED ORDER — ACETAMINOPHEN 325 MG PO TABS: 650.0000 mg | ORAL_TABLET | ORAL | Status: DC | PRN

## 2015-09-22 MED ORDER — AZATHIOPRINE 50 MG PO TABS
50.0000 mg | ORAL_TABLET | Freq: Every day | ORAL | Status: DC
  Administered 2015-09-22 – 2015-10-06 (×15): 50 mg via ORAL
  Filled 2015-09-22 (×15): qty 1

## 2015-09-22 MED ORDER — HEPARIN SODIUM (PORCINE) 1000 UNIT/ML IJ SOLN
1000.0000 [IU] | Freq: Once | INTRAMUSCULAR | Status: DC
  Filled 2015-09-22: qty 1

## 2015-09-22 MED ORDER — METOPROLOL TARTRATE 25 MG PO TABS
25.0000 mg | ORAL_TABLET | Freq: Two times a day (BID) | ORAL | Status: DC
  Administered 2015-09-22 – 2015-09-28 (×13): 25 mg via ORAL
  Filled 2015-09-22 (×12): qty 1

## 2015-09-22 MED ORDER — ADULT MULTIVITAMIN W/MINERALS CH
1.0000 | ORAL_TABLET | Freq: Every day | ORAL | Status: DC
  Administered 2015-09-22 – 2015-10-06 (×15): 1 via ORAL
  Filled 2015-09-22 (×15): qty 1

## 2015-09-22 MED ORDER — SODIUM CHLORIDE 0.9 % IJ SOLN
3.0000 mL | Freq: Two times a day (BID) | INTRAMUSCULAR | Status: DC
  Administered 2015-09-24 – 2015-10-05 (×14): 3 mL via INTRAVENOUS

## 2015-09-22 MED ORDER — CALCIUM CARBONATE ANTACID 500 MG PO CHEW: 2.0000 | CHEWABLE_TABLET | ORAL | Status: AC

## 2015-09-22 MED ORDER — STROKE: EARLY STAGES OF RECOVERY BOOK
Freq: Once | Status: DC
  Filled 2015-09-22: qty 1

## 2015-09-22 MED ORDER — LORAZEPAM 1 MG PO TABS
1.0000 mg | ORAL_TABLET | Freq: Once | ORAL | Status: AC
  Administered 2015-09-22: 1 mg via ORAL
  Filled 2015-09-22: qty 1

## 2015-09-22 MED ORDER — ONDANSETRON HCL 4 MG/2ML IJ SOLN: 4.0000 mg | Freq: Four times a day (QID) | INTRAMUSCULAR | Status: DC | PRN

## 2015-09-22 MED ORDER — SODIUM CHLORIDE 0.9 % IV SOLN
500.0000 mg | Freq: Two times a day (BID) | INTRAVENOUS | Status: DC
  Administered 2015-09-23 – 2015-09-27 (×10): 500 mg via INTRAVENOUS
  Filled 2015-09-22 (×15): qty 4

## 2015-09-22 MED ORDER — SODIUM CHLORIDE 0.9 % IV SOLN: 250.0000 mL | INTRAVENOUS | Status: DC | PRN

## 2015-09-22 MED ORDER — OXYCODONE HCL 5 MG PO TABS
5.0000 mg | ORAL_TABLET | ORAL | Status: DC | PRN
Start: 2015-09-22 — End: 2015-10-06
  Administered 2015-09-22 – 2015-10-06 (×40): 5 mg via ORAL
  Filled 2015-09-22 (×45): qty 1

## 2015-09-22 MED ORDER — SODIUM CHLORIDE 0.9 % IJ SOLN: 3.0000 mL | INTRAMUSCULAR | Status: DC | PRN

## 2015-09-22 MED ORDER — ENOXAPARIN SODIUM 40 MG/0.4ML ~~LOC~~ SOLN
40.0000 mg | Freq: Every day | SUBCUTANEOUS | Status: DC
  Administered 2015-09-22: 40 mg via SUBCUTANEOUS
  Filled 2015-09-22 (×2): qty 0.4

## 2015-09-22 MED ORDER — LORAZEPAM 0.5 MG PO TABS
0.5000 mg | ORAL_TABLET | ORAL | Status: DC | PRN
  Administered 2015-09-22 – 2015-10-06 (×44): 0.5 mg via ORAL
  Filled 2015-09-22 (×49): qty 1

## 2015-09-22 MED ORDER — SODIUM CHLORIDE 0.9 % IJ SOLN
3.0000 mL | Freq: Two times a day (BID) | INTRAMUSCULAR | Status: DC
  Administered 2015-09-22 – 2015-10-06 (×17): 3 mL via INTRAVENOUS

## 2015-09-22 MED ORDER — MAGNESIUM SULFATE IN D5W 10-5 MG/ML-% IV SOLN
1.0000 g | Freq: Once | INTRAVENOUS | Status: AC
  Administered 2015-09-22: 1 g via INTRAVENOUS
  Filled 2015-09-22 (×2): qty 100

## 2015-09-22 MED ORDER — SENNOSIDES-DOCUSATE SODIUM 8.6-50 MG PO TABS
1.0000 | ORAL_TABLET | Freq: Every evening | ORAL | Status: DC | PRN
  Administered 2015-09-27 – 2015-10-02 (×3): 1 via ORAL
  Filled 2015-09-22 (×4): qty 1

## 2015-09-22 MED ORDER — PREGABALIN 25 MG PO CAPS
50.0000 mg | ORAL_CAPSULE | Freq: Three times a day (TID) | ORAL | Status: DC
  Administered 2015-09-22 – 2015-09-25 (×11): 50 mg via ORAL
  Filled 2015-09-22 (×5): qty 2
  Filled 2015-09-22: qty 1
  Filled 2015-09-22 (×3): qty 2
  Filled 2015-09-22: qty 1
  Filled 2015-09-22: qty 2

## 2015-09-22 NOTE — Progress Notes (Signed)
PT IS TRANSFERRED TO 5C08 FROM ED. PT IS AOX4 AND VS IS STABLE.

## 2015-09-22 NOTE — Procedures (Signed)
CLINICAL DATA: [Transverse myelitis.]  EXAM:  DIAGNOSTIC LUMBAR PUNCTURE UNDER FLUOROSCOPIC GUIDANCE  FLUOROSCOPY TIME:   Fluoroscopy Time (in minutes and seconds):  [0 minutes, 37 seconds]  Number of Acquired Images:  [0]  PROCEDURE:  I discussed the risks (including hemorrhage, infection, headache, and nerve damage, among others), benefits, and alternatives to fluoroscopically guided lumbar puncture with the patient.  We specifically discussed the high technical likelihood of success of the procedure. The patient understood and elected to undergo the procedure.      Standard time-out was employed.  Following sterile skin prep and local anesthetic administration consisting of 1 percent lidocaine, a 22 gauge spinal needle was advanced without difficulty into the thecal sac at the at the [L4-5] level.  Clear CSF was returned.  Opening pressure was [17 cm of water].      13 cc of clear CSF was collected.  The needle was subsequently removed and the skin cleansed and bandaged.  No immediate complications were observed.       IMPRESSION: [ Technically successful fluoroscopically guided lumbar puncture at the L4-5 level.  No complicating feature observed.  Opening pressure 17 cm of water.]

## 2015-09-22 NOTE — Progress Notes (Signed)
  Echocardiogram 2D Echocardiogram has been performed.  Diamond Nickel 09/22/2015, 4:24 PM

## 2015-09-22 NOTE — Progress Notes (Addendum)
Subjective: No acute changes, LP just performed  Exam: Filed Vitals:   09/22/15 0815 09/22/15 0939  BP: 119/63 129/84  Pulse: 109 117  Temp:    Resp:  16   Gen: In bed, NAD Resp: non-labored breathing, no acute distress Abd: soft, nt  Neuro: MS: awake, alert, cooperative,  [id number], pupils are reactive.  Motor: 5/5 on right, 2/5 LUE, 3/5 LLE Sensory:decreased on the left.   Pertinent Labs: cmp - unremarkable No leukocytosis   Impression: 50 yo female with his history of bilateral optic neuritis now with longitudinally extensive transverse myelitis. 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 She does have a history of some joint pain, that is the only other symptom of lupus which she has had.  With difficult disease, there is a trend towards improvement with starting plasma exchange concomittently with steroids as opposing to wait for nonresponse to steroids. Given that clinically this fits, I would favor pursuing this. If it is due to non-aquaporin 4 antibody related lupus, then it can be presumed to be antibody mediated as well and therefore plasma exchange is still be an appropriate therapy.  I do not have any concern for stroke or TIA  Recommendations: 1) Solu-Medrol 1 g daily for 5 days 2) concomitant plasma exchange one exchange every other day for total of 5 treatments(we will arrange) 3) neurology will continue to follow  Roland Rack, MD [facility] [phone]  If 7pm- 7am, please page neurology on call as listed in [place].

## 2015-09-22 NOTE — Progress Notes (Signed)
PT Cancellation Note  Patient Details Name: KORALEIGH MIXTER MRN: [id number] DOB: 27-May-1966   Cancelled Treatment:    Reason Eval/Treat Not Completed: Other (comment) (Pt in process of transferring to inpatient bed.) Will follow up later today or tomorrow.   Cayce Quezada 09/22/2015, 2:14 PM [facility] PT [phone]

## 2015-09-22 NOTE — ED Notes (Signed)
Patient transported to X-ray 

## 2015-09-22 NOTE — ED Notes (Signed)
Pt requested to have all 4 bed rails up.

## 2015-09-22 NOTE — Progress Notes (Signed)
PT Cancellation Note  Patient Details Name: Andrea Berry MRN: [id number] DOB: September 23, 1965   Cancelled Treatment:    Reason Eval/Treat Not Completed: Patient at procedure or test/unavailable (2 D echo). Will follow up tomorrow.   Ethyn Schetter 09/22/2015, 4:05 PM [place]

## 2015-09-22 NOTE — Progress Notes (Addendum)
Patient Demographics:    Andrea Berry, is a 50 y.o. female, DOB - 09-May-1966, [id number]  Admit date - 09/21/2015   Admitting Physician No admitting provider for patient encounter.  Outpatient Primary MD for the patient is Andrea Hatchet, NP  LOS -    Chief Complaint  Patient presents with  . Extremity Weakness        Subjective:    Andrea Berry today has, No headache, No chest pain, No abdominal pain - No Nausea, No new weakness tingling or numbness, No Cough - SOB. +ve severe left arm weakness.   Assessment  & Plan :     1. Severe left arm weakness ongoing for 3 days. Underlying history of legal blindness. Secondary to Devic's disease, neurology on board. Patient on Solu-Medrol and due for plasma exchange as directed by neurology. Further management of this problem defer to neurology.  2. History of lupus. On home dose prednisone, currently on Solu-Medrol along with Imuran. Outpatient follow-up with Dr. Estanislado Berry postdischarge.  3. History of TIA/stroke in the past. No acute issues.  4. Prolonged QTC. IV magnesium, low-dose beta blocker, monitor on telemetry.  5. Dehydration with tachycardia. Hydrate, monitor. Check baseline TSH and echogram.  6. GERD. On PPI.    Code Status : Full  Family Communication  : None present  Disposition Plan  : Stay inpatient  Consults  :  Neurology  Procedures  :   CT head, MRI brain and C-spine consistent with Devic's disease.  Lumbar puncture not consistent with meningitis (D/W - Dr Leonel Ramsay)    DVT Prophylaxis  :  Lovenox  Lab Results  Component Value Date   PLT 236 09/22/2015    Inpatient Medications  Scheduled Meds: .  stroke: mapping our early stages of recovery book   Does not apply Once  . azaTHIOprine  50 mg Oral Daily    . [START ON 09/23/2015] enoxaparin (LOVENOX) injection  40 mg Subcutaneous Q0600  . methylPREDNISolone (SOLU-MEDROL) injection  500 mg Intravenous BID  . multivitamin with minerals  1 tablet Oral Daily  . pantoprazole  40 mg Oral Daily  . pregabalin  50 mg Oral TID  . sodium chloride  3 mL Intravenous Q12H  . sodium chloride  3 mL Intravenous Q12H   Continuous Infusions: . sodium chloride     PRN Meds:.sodium chloride, LORazepam, ondansetron **OR** ondansetron (ZOFRAN) IV, oxyCODONE, senna-docusate, sodium chloride  Antibiotics  :    Anti-infectives    None        Objective:   Filed Vitals:   09/22/15 0715 09/22/15 0730 09/22/15 0815 09/22/15 0939  BP: 118/65 124/72 119/63 129/84  Pulse: 110 111 109 117  Temp:      TempSrc:      Resp:    16  Height:      Weight:      SpO2: 95% 97% 99% 98%    Wt Readings from Last 3 Encounters:  09/21/15 79.379 kg (175 lb)  02/08/14 73.199 kg (161 lb 6 oz)  01/29/14 75.297 kg (166 lb)     Intake/Output Summary (Last 24 hours) at 09/22/15 1314 Last data filed at 09/22/15 0549  Gross per 24 hour  Intake      3 ml  Output      0 ml  Net      3 ml     Physical Exam  Awake Alert, Oriented X 3, No new F.N deficits, left arm strength 1 over 5, legally blind, Normal affect Andrea Berry.AT,PERRAL Supple Neck,No JVD, No cervical lymphadenopathy appriciated.  Symmetrical Chest wall movement, Good air movement bilaterally, CTAB RRR,No Gallops,Rubs or new Murmurs, No Parasternal Heave +ve B.Sounds, Abd Soft, No tenderness, No organomegaly appriciated, No rebound - guarding or rigidity. No Cyanosis, Clubbing or edema, No new Rash or bruise       Data Review:   Micro Results Recent Results (from the past 240 hour(s))  CSF culture     Status: None (Preliminary result)   Collection Time: 09/22/15  8:57 AM  Result Value Ref Range Status   Specimen Description CSF TUBE 2  Final   Special Requests NONE  Final   Gram Stain   Final    WBC  PRESENT,BOTH PMN AND MONONUCLEAR NO ORGANISMS SEEN CYTOSPIN SMEAR    Culture PENDING  Incomplete   Report Status PENDING  Incomplete    Radiology Reports Dg Chest 2 View  09/22/2015  CLINICAL DATA:  TIA. EXAM: CHEST  2 VIEW COMPARISON:  04/29/2012 FINDINGS: Lung volumes are low. Cardiomegaly is unchanged allowing for differences in technique. Minimal bibasilar subsegmental atelectasis no pulmonary edema, consolidation, pleural effusion or pneumothorax. No acute osseous abnormalities are seen. IMPRESSION: Low lung volumes with stable cardiomegaly. Electronically Signed   By: Jeb Levering M.D.   On: 09/22/2015 03:36   Ct Head Wo Contrast  09/21/2015  CLINICAL DATA:  Acute left-sided weakness. EXAM: CT HEAD WITHOUT CONTRAST TECHNIQUE: Contiguous axial images were obtained from the base of the skull through the vertex without intravenous contrast. COMPARISON:  CT scan of June 24, 2014. FINDINGS: Bony calvarium appears intact. No mass effect or midline shift is noted. Ventricular size is within normal limits. There is no evidence of mass lesion, hemorrhage or acute infarction. IMPRESSION: Normal head CT. Electronically Signed   By: Marijo Conception, M.D.   On: 09/21/2015 16:27   Mr Brain Wo Contrast  09/21/2015  CLINICAL DATA:  Left arm numbness and pain. Neck pain. Stroke versus cervical spine disease. EXAM: MRI HEAD WITHOUT CONTRAST MRI CERVICAL SPINE WITHOUT CONTRAST TECHNIQUE: Multiplanar, multiecho pulse sequences of the brain and surrounding structures, and cervical spine, to include the craniocervical junction and cervicothoracic junction, were obtained without intravenous contrast. COMPARISON:  Head CT earlier same day.  MRI 06/24/2014 FINDINGS: MRI HEAD FINDINGS There is a 1 cm region of T2 and FLAIR abnormality within the left thalamus. No restricted diffusion presently. On the old MRI, there appear to be an old small vessel insult in this location. Elsewhere, the cerebral hemispheres show  a few punctate foci of T2 and FLAIR signal in the deep white matter. No cortical or large vessel territory insult. No mass lesion, hemorrhage, hydrocephalus or extra-axial collection. No pituitary mass. No inflammatory sinus disease. No skull or skullbase lesion. MRI CERVICAL SPINE FINDINGS The cervical spinal cord is diffusely abnormal showing enlargement and inter medullary T2 signal. This extends from the obex all the way to the C7 level. This is consistent with acute deep mild is a shin or myelitis. There are no degenerative changes. There is no compressive pathology. IMPRESSION: 1 cm region of abnormal T2 and FLAIR signal within the left thalamus not consistent with acute infarction. This could be an acute focus of demyelination or conceivably but less  likely a late subacute infarction. Diffusely abnormal cervical spinal cord which is swollen and shows diffuse abnormal T2 signal. This is likely to represent acute demyelination/neuromyelitis optica (Devic's disease) or acute transverse myelitis. Quite likely that the findings of the left thalamus in the spinal cord are due to the same pathology. Electronically Signed   By: Nelson Chimes M.D.   On: 09/21/2015 20:41   Mr Jeri Cos Contrast  09/22/2015  CLINICAL DATA:  50 yo female with his history of bilateral optic neuritis now with longitudinally extensive transverse myelitis. Weakness of arms and legs. EXAM: MRI CERVICAL SPINE WITH CONTRAST; MRI HEAD WITH CONTRAST TECHNIQUE: Multiplanar and multiecho pulse sequences of the cervical spine, to include the craniocervical junction and cervicothoracic junction, were obtained according to standard protocol with intravenous contrast.; Multiplanar, multiecho pulse sequences of the brain and surrounding structures were obtained according to standard protocol with intravenous contrast CONTRAST:  82mL MULTIHANCE GADOBENATE DIMEGLUMINE 529 MG/ML IV SOLN COMPARISON:  Noncontrast brain and cervical spine from 07/21/2016.  FINDINGS: Post infusion images of the brain demonstrates no abnormal enhancement. LEFT thalamic lesion, T2 and FLAIR hyperintense, probably represents an atypical area of demyelination, but non acute, given the lack of restricted diffusion and absence of blood brain barrier breakdown. In the cervical spine, there is extensive postcontrast enhancement, corresponding to the T2 and FLAIR hyperintensity on yesterday's exam, extending over multiple cervical segments, from the foramen magnum through C7. This enhancement involves both gray and white matter. There is greater involvement of the LEFT hemicord. In the setting of a patient with history of optic neuritis, the findings are consistent with Devic's disease. No features specific for optic neuritis can be observed on post infusion imaging through the brain, non dedicated orbit study. IMPRESSION: No abnormal intracranial enhancement is documented. Heterogeneous enhancement of the enlarged cervical cord segments, from foramen magnum through C7, involving both gray and white matter, consistent with Devic's disease. Electronically Signed   By: Staci Righter M.D.   On: 09/22/2015 12:39   Mr Cervical Spine Wo Contrast  09/21/2015  CLINICAL DATA:  Left arm numbness and pain. Neck pain. Stroke versus cervical spine disease. EXAM: MRI HEAD WITHOUT CONTRAST MRI CERVICAL SPINE WITHOUT CONTRAST TECHNIQUE: Multiplanar, multiecho pulse sequences of the brain and surrounding structures, and cervical spine, to include the craniocervical junction and cervicothoracic junction, were obtained without intravenous contrast. COMPARISON:  Head CT earlier same day.  MRI 06/24/2014 FINDINGS: MRI HEAD FINDINGS There is a 1 cm region of T2 and FLAIR abnormality within the left thalamus. No restricted diffusion presently. On the old MRI, there appear to be an old small vessel insult in this location. Elsewhere, the cerebral hemispheres show a few punctate foci of T2 and FLAIR signal in the  deep white matter. No cortical or large vessel territory insult. No mass lesion, hemorrhage, hydrocephalus or extra-axial collection. No pituitary mass. No inflammatory sinus disease. No skull or skullbase lesion. MRI CERVICAL SPINE FINDINGS The cervical spinal cord is diffusely abnormal showing enlargement and inter medullary T2 signal. This extends from the obex all the way to the C7 level. This is consistent with acute deep mild is a shin or myelitis. There are no degenerative changes. There is no compressive pathology. IMPRESSION: 1 cm region of abnormal T2 and FLAIR signal within the left thalamus not consistent with acute infarction. This could be an acute focus of demyelination or conceivably but less likely a late subacute infarction. Diffusely abnormal cervical spinal cord which is swollen and shows  diffuse abnormal T2 signal. This is likely to represent acute demyelination/neuromyelitis optica (Devic's disease) or acute transverse myelitis. Quite likely that the findings of the left thalamus in the spinal cord are due to the same pathology. Electronically Signed   By: Nelson Chimes M.D.   On: 09/21/2015 20:41   Mr Cervical Spine W Contrast  09/22/2015  CLINICAL DATA:  50 yo female with his history of bilateral optic neuritis now with longitudinally extensive transverse myelitis. Weakness of arms and legs. EXAM: MRI CERVICAL SPINE WITH CONTRAST; MRI HEAD WITH CONTRAST TECHNIQUE: Multiplanar and multiecho pulse sequences of the cervical spine, to include the craniocervical junction and cervicothoracic junction, were obtained according to standard protocol with intravenous contrast.; Multiplanar, multiecho pulse sequences of the brain and surrounding structures were obtained according to standard protocol with intravenous contrast CONTRAST:  107mL MULTIHANCE GADOBENATE DIMEGLUMINE 529 MG/ML IV SOLN COMPARISON:  Noncontrast brain and cervical spine from 07/21/2016. FINDINGS: Post infusion images of the brain  demonstrates no abnormal enhancement. LEFT thalamic lesion, T2 and FLAIR hyperintense, probably represents an atypical area of demyelination, but non acute, given the lack of restricted diffusion and absence of blood brain barrier breakdown. In the cervical spine, there is extensive postcontrast enhancement, corresponding to the T2 and FLAIR hyperintensity on yesterday's exam, extending over multiple cervical segments, from the foramen magnum through C7. This enhancement involves both gray and white matter. There is greater involvement of the LEFT hemicord. In the setting of a patient with history of optic neuritis, the findings are consistent with Devic's disease. No features specific for optic neuritis can be observed on post infusion imaging through the brain, non dedicated orbit study. IMPRESSION: No abnormal intracranial enhancement is documented. Heterogeneous enhancement of the enlarged cervical cord segments, from foramen magnum through C7, involving both gray and white matter, consistent with Devic's disease. Electronically Signed   By: Staci Righter M.D.   On: 09/22/2015 12:39   Dg Fluoro Guide Lumbar Puncture  09/22/2015  CLINICAL DATA:  Transverse myelitis. EXAM: DIAGNOSTIC LUMBAR PUNCTURE UNDER FLUOROSCOPIC GUIDANCE FLUOROSCOPY TIME:  Fluoroscopy Time (in minutes and seconds): 0 minutes, 37 seconds Number of Acquired Images:  0 PROCEDURE: I discussed the risks (including hemorrhage, infection, headache, and nerve damage, among others), benefits, and alternatives to fluoroscopically guided lumbar puncture with the patient. We specifically discussed the high technical likelihood of success of the procedure. The patient understood and elected to undergo the procedure. Standard time-out was employed. Following sterile skin prep and local anesthetic administration consisting of 1 percent lidocaine, a 22 gauge spinal needle was advanced without difficulty into the thecal sac at the at the L4-5 level. Clear  CSF was returned. Opening pressure was 17 cm of water. 13 cc of clear CSF was collected. The needle was subsequently removed and the skin cleansed and bandaged. No immediate complications were observed. IMPRESSION: 1. Technically successful fluoroscopically guided lumbar puncture at the L4-5 level. No complicating feature observed. Opening pressure 17 cm of water. Electronically Signed   By: Van Clines M.D.   On: 09/22/2015 09:15     CBC  Recent Labs Lab 09/21/15 1422 09/21/15 1600 09/22/15 0351  WBC 3.2*  --  4.8  HGB 13.7 16.3* 11.7*  HCT 42.1 48.0* 36.5  PLT 251  --  236  MCV 72.1*  --  71.3*  MCH 23.5*  --  22.9*  MCHC 32.5  --  32.1  RDW 14.3  --  14.0  LYMPHSABS 0.8  --  0.3*  MONOABS  0.4  --  0.2  EOSABS 0.0  --  0.0  BASOSABS 0.0  --  0.0    Chemistries   Recent Labs Lab 09/21/15 1422 09/21/15 1600 09/22/15 0350 09/22/15 0351  NA 135 137  --  137  K 4.7 5.4*  --  3.7  CL 104 106  --  107  CO2 19*  --   --  20*  GLUCOSE 91 88  --  135*  BUN 12 18  --  9  CREATININE 0.80 0.80 0.80 0.77  CALCIUM 10.1  --   --  9.2  MG  --   --   --  1.8  AST 63*  --   --  45*  ALT 48  --   --  40  ALKPHOS 54  --   --  50  BILITOT 1.2  --   --  1.8*   ------------------------------------------------------------------------------------------------------------------  Recent Labs  09/22/15 0344  CHOL 188  HDL 38*  LDLCALC 129*  TRIG 104  CHOLHDL 4.9    No results found for: HGBA1C ------------------------------------------------------------------------------------------------------------------  Recent Labs  09/21/15 2332  TSH 3.957   ------------------------------------------------------------------------------------------------------------------  Recent Labs  09/21/15 2333  VITAMINB12 361    Coagulation profile No results for input(s): INR, PROTIME in the last 168 hours.  No results for input(s): DDIMER in the last 72 hours.  Cardiac Enzymes No  results for input(s): CKMB, TROPONINI, MYOGLOBIN in the last 168 hours.  Invalid input(s): CK ------------------------------------------------------------------------------------------------------------------ No results found for: BNP  Time Spent in minutes   35   Lala Lund K M.D on 09/22/2015 at 1:14 PM  Between 7am to 7pm - Pager - [phone]  After 7pm go to [email or web address] - password [facility]  [facility] -  Office  [phone]

## 2015-09-23 ENCOUNTER — Inpatient Hospital Stay (HOSPITAL_COMMUNITY): Payer: Medicare Other

## 2015-09-23 ENCOUNTER — Encounter (HOSPITAL_COMMUNITY): Payer: Self-pay | Admitting: Physician Assistant

## 2015-09-23 LAB — COMPREHENSIVE METABOLIC PANEL
ALBUMIN: 3.8 g/dL (ref 3.5–5.0)
ALT: 48 U/L (ref 14–54)
ANION GAP: 8 (ref 5–15)
AST: 50 U/L — AB (ref 15–41)
Alkaline Phosphatase: 54 U/L (ref 38–126)
BUN: 9 mg/dL (ref 6–20)
CHLORIDE: 110 mmol/L (ref 101–111)
CO2: 21 mmol/L — AB (ref 22–32)
Calcium: 9.6 mg/dL (ref 8.9–10.3)
Creatinine, Ser: 0.69 mg/dL (ref 0.44–1.00)
GFR calc Af Amer: >60 mL/min (ref >60)
GFR calc non Af Amer: >60 mL/min (ref >60)
GLUCOSE: 124 mg/dL — AB (ref 65–99)
POTASSIUM: 5 mmol/L (ref 3.5–5.1)
Sodium: 139 mmol/L (ref 135–145)
Total Bilirubin: 0.8 mg/dL (ref 0.3–1.2)
Total Protein: 8 g/dL (ref 6.5–8.1)

## 2015-09-23 LAB — CBC WITH DIFFERENTIAL/PLATELET
BASOS ABS: 0 10*3/uL (ref 0.0–0.1)
Basophils Relative: 0 %
Eosinophils Absolute: 0 10*3/uL (ref 0.0–0.7)
Eosinophils Relative: 0 %
HEMATOCRIT: 38 % (ref 36.0–46.0)
Hemoglobin: 12.1 g/dL (ref 12.0–15.0)
LYMPHS ABS: 0.4 10*3/uL — AB (ref 0.7–4.0)
LYMPHS PCT: 6 %
MCH: 23.1 pg — AB (ref 26.0–34.0)
MCHC: 31.8 g/dL (ref 30.0–36.0)
MCV: 72.5 fL — AB (ref 78.0–100.0)
MONO ABS: 0.1 10*3/uL (ref 0.1–1.0)
MONOS PCT: 2 %
NEUTROS ABS: 5.6 10*3/uL (ref 1.7–7.7)
Neutrophils Relative %: 92 %
Platelets: 187 10*3/uL (ref 150–400)
RBC: 5.24 MIL/uL — ABNORMAL HIGH (ref 3.87–5.11)
RDW: 14.4 % (ref 11.5–15.5)
WBC: 6.1 10*3/uL (ref 4.0–10.5)

## 2015-09-23 LAB — PROTIME-INR
INR: 1.07 (ref 0.00–1.49)
Prothrombin Time: 14.1 seconds (ref 11.6–15.2)

## 2015-09-23 LAB — GLUCOSE, CAPILLARY
GLUCOSE-CAPILLARY: 113 mg/dL — AB (ref 65–99)
GLUCOSE-CAPILLARY: 122 mg/dL — AB (ref 65–99)
GLUCOSE-CAPILLARY: 117 mg/dL — AB (ref 65–99)
GLUCOSE-CAPILLARY: 108 mg/dL — AB (ref 65–99)
Glucose-Capillary: 121 mg/dL — ABNORMAL HIGH (ref 65–99)

## 2015-09-23 LAB — HEMOGLOBIN A1C
HEMOGLOBIN A1C: 6 % — AB (ref 4.8–5.6)
Mean Plasma Glucose: 126 mg/dL

## 2015-09-23 LAB — OLIGOCLONAL BANDS, CSF + SERM

## 2015-09-23 LAB — VDRL, CSF: VDRL Quant, CSF: NONREACTIVE

## 2015-09-23 LAB — ANTINUCLEAR ANTIBODIES, IFA: ANTINUCLEAR ANTIBODIES, IFA: NEGATIVE

## 2015-09-23 MED ORDER — FENTANYL CITRATE (PF) 100 MCG/2ML IJ SOLN
INTRAMUSCULAR | Status: AC
  Filled 2015-09-23: qty 2

## 2015-09-23 MED ORDER — CEFAZOLIN SODIUM-DEXTROSE 2-3 GM-% IV SOLR
2.0000 g | Freq: Once | INTRAVENOUS | Status: AC
  Administered 2015-09-23: 2 g via INTRAVENOUS
  Filled 2015-09-23: qty 50

## 2015-09-23 MED ORDER — MIDAZOLAM HCL 2 MG/2ML IJ SOLN
INTRAMUSCULAR | Status: AC | PRN
  Administered 2015-09-23: 0.5 mg via INTRAVENOUS
  Administered 2015-09-23: 1 mg via INTRAVENOUS

## 2015-09-23 MED ORDER — HEPARIN SODIUM (PORCINE) 1000 UNIT/ML IJ SOLN
INTRAMUSCULAR | Status: AC
  Filled 2015-09-23: qty 1

## 2015-09-23 MED ORDER — MIDAZOLAM HCL 2 MG/2ML IJ SOLN
INTRAMUSCULAR | Status: AC
  Filled 2015-09-23: qty 2

## 2015-09-23 MED ORDER — INFLUENZA VAC SPLIT QUAD 0.5 ML IM SUSY
0.5000 mL | PREFILLED_SYRINGE | INTRAMUSCULAR | Status: AC
  Administered 2015-09-24: 0.5 mL via INTRAMUSCULAR
  Filled 2015-09-23: qty 0.5

## 2015-09-23 MED ORDER — ACD FORMULA A 0.73-2.45-2.2 GM/100ML VI SOLN
Status: AC
  Administered 2015-09-23: 14:00:00
  Filled 2015-09-23: qty 500

## 2015-09-23 MED ORDER — FENTANYL CITRATE (PF) 100 MCG/2ML IJ SOLN
INTRAMUSCULAR | Status: AC | PRN
  Administered 2015-09-23: 25 ug via INTRAVENOUS
  Administered 2015-09-23: 50 ug via INTRAVENOUS

## 2015-09-23 MED ORDER — LIDOCAINE HCL 1 % IJ SOLN
INTRAMUSCULAR | Status: AC
  Filled 2015-09-23: qty 20

## 2015-09-23 MED ORDER — DIPHENHYDRAMINE HCL 25 MG PO CAPS
ORAL_CAPSULE | ORAL | Status: AC
  Filled 2015-09-23: qty 1

## 2015-09-23 MED ORDER — ENOXAPARIN SODIUM 40 MG/0.4ML ~~LOC~~ SOLN
40.0000 mg | Freq: Every day | SUBCUTANEOUS | Status: DC
  Administered 2015-09-24 – 2015-10-06 (×13): 40 mg via SUBCUTANEOUS
  Filled 2015-09-23 (×13): qty 0.4

## 2015-09-23 NOTE — Progress Notes (Signed)
OT Cancellation Note  Patient Details Name: Andrea Berry MRN: [id number] DOB: 04-16-1966   Cancelled Treatment:    Reason Eval/Treat Not Completed: Patient at procedure or test/ unavailable (IR). Will attempt to see later today if time allows.  Redmond Baseman, OTR/L Pager: [phone] 09/23/2015, 8:34 AM

## 2015-09-23 NOTE — Care Management Note (Signed)
Case Management Note  Patient Details  Name: RAPHAELLA HASKIN MRN: [id number] Date of Birth: 05/05/1966  Subjective/Objective:  Patient admitted with Devics Disease. She is legally blind. She is from home with her children.                    Action/Plan: Plan is for plasma exchange and IV steroids. CM will continue to follow for discharge needs.   Expected Discharge Date:                  Expected Discharge Plan:     In-House Referral:     Discharge planning Services     Post Acute Care Choice:    Choice offered to:     DME Arranged:    DME Agency:     HH Arranged:    HH Agency:     Status of Service:  In process, will continue to follow  Medicare Important Message Given:    Date Medicare IM Given:    Medicare IM give by:    Date Additional Medicare IM Given:    Additional Medicare Important Message give by:     If discussed at [place] of Stay Meetings, dates discussed:    Additional Comments:  Pollie Friar, RN 09/23/2015, 3:36 PM

## 2015-09-23 NOTE — Evaluation (Addendum)
Physical Therapy Evaluation Patient Details Name: Andrea Berry MRN: [id number] DOB: 08/12/1966 Today's Date: 09/23/2015   History of Present Illness  50 yo female initially presenting with Lt UE weakness and numbness. Pt has a history of bilateral optic neuritislitis, consistent with Devic disease. PMH: hypertension, blind, lupus, depression, CVA 2008.  Clinical Impression  Pt admitted with above diagnosis. Pt currently with functional limitations due to the deficits listed below (see PT Problem List). Pt will benefit from skilled PT to increase their independence and safety with mobility to allow discharge to the venue listed below. At this time the patient is presenting with significant weakness through the Lt UE/LE with UE more limited that LE. Patient able to stand X2 with moderate assistance but not safe for pivot transfers at this time. PT to continue to follow to progress mobility and safety.    Follow Up Recommendations Supervision/Assistance - 24 hour;CIR    Equipment Recommendations  Other (comment) (further assessment needed. )    Recommendations for Other Services       Precautions / Restrictions Precautions Precautions: Fall Precaution Comments: Lt side weakness UE >LE, blind Restrictions Weight Bearing Restrictions: No      Mobility  Bed Mobility Overal bed mobility: Needs Assistance Bed Mobility: Supine to Sit;Sit to Supine     Supine to sit: Min assist Sit to supine: Min assist   General bed mobility comments: Assist needed with Lt UE/LE.   Transfers Overall transfer level: Needs assistance Equipment used: 1 person hand held assist Transfers: Sit to/from Stand Sit to Stand: Mod assist         General transfer comment: Assist on Lt side with hand and Lt LE stabilization. PT blocking Lt LE with weight shift to Lt. Patient maintaining weight primarily through [place]. Repeat sit/stand X2.   Ambulation/Gait                Stairs             Wheelchair Mobility    Modified Rankin (Stroke Patients Only)       Balance Overall balance assessment: Needs assistance Sitting-balance support: No upper extremity supported Sitting balance-Leahy Scale: Fair     Standing balance support: Bilateral upper extremity supported Standing balance-Leahy Scale: Poor Standing balance comment: Leaning to Lt onto therapist for additional support                             Pertinent Vitals/Pain Pain Assessment: 0-10 Pain Score: 0-No pain (took pain meds @ 2 hours ago) Pain Location: Lt arm Pain Descriptors / Indicators: Sore Pain Intervention(s): Limited activity within patient's tolerance;Monitored during session    Home Living Family/patient expects to be discharged to:: Private residence Living Arrangements: Children Available Help at Discharge: Family;Available PRN/intermittently Type of Home: House Home Access: Stairs to enter Entrance Stairs-Rails: None Entrance Stairs-Number of Steps: 3 Home Layout: One level Home Equipment: None Additional Comments: son lives with patient, not always available    Prior Function Level of Independence: Independent         Comments: 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 Demonstrates ability to initiate grasp/release. Minimal active shoulder movement against gravity. Demosntratesactive shoulder adduction.  complains of pain in upper traps.? spasms. increased apsticity LUE   Lower Extremity Assessment: Defer to PT evaluation   LLE Deficits / Details: gross LLE weakness, able to perform Long arc quad in sitting, active dorsiflexion/plantarflexion noted.   Cervical / Trunk Assessment: Normal  Communication   Communication:  No difficulties  Cognition Arousal/Alertness: Awake/alert Behavior During Therapy: Flat affect Overall Cognitive Status: Within Functional Limits for tasks assessed                      General Comments General comments (skin integrity, edema, etc.): Patient educated on hand positioning for Lt LE for fingers in extended position    Exercises Other Exercises Other Exercises: son present and educated on LUE AAROM and importance of keeping LUE supported on pillows.      Assessment/Plan    PT Assessment Patient needs continued PT services  PT Diagnosis Difficulty walking;Generalized weakness;Acute pain   PT Problem List Decreased strength;Decreased range of motion;Decreased activity tolerance;Decreased balance;Decreased mobility;Pain  PT Treatment Interventions DME instruction;Gait training;Stair training;Functional mobility training;Therapeutic activities;Therapeutic exercise;Balance training;Neuromuscular re-education;Patient/family education   PT Goals (Current goals can be found in the Care Plan section) Acute Rehab PT Goals Patient Stated Goal: return to being independent PT Goal Formulation: With patient Time For Goal Achievement: 10/07/15 Potential to Achieve Goals: Fair    Frequency Min 3X/week   Barriers to discharge Decreased caregiver support      Co-evaluation               End of Session Equipment Utilized During Treatment: Gait belt Activity Tolerance: Patient limited by fatigue Patient left: in bed;with call bell/phone within reach;with bed alarm set;with family/visitor present Nurse Communication: Mobility status;Precautions         Time: [id number] PT Time Calculation (min) (ACUTE ONLY): 33 min   Charges:   PT Evaluation $PT Eval High Complexity: 1 Procedure PT Treatments $Therapeutic Activity: 8-22 mins   PT G Codes:        Cassell Clement, PT, CSCS Pager [phone] Office [phone]  09/23/2015, 3:37 PM

## 2015-09-23 NOTE — Progress Notes (Signed)
Rehab Admissions Coordinator Note:  Patient was screened by Retta Diones for appropriateness for an Inpatient Acute Rehab Consult.  At this time, we are recommending Inpatient Rehab consult.  Retta Diones 09/23/2015, 4:08 PM  I can be reached at [phone].

## 2015-09-23 NOTE — H&P (Signed)
Chief Complaint: Transverse Myelitis/Devic's disease  Referring Physician(s):   History of Present Illness: Andrea Berry is a 50 y.o. female with known history of Lupus who presented initially with left arm weakness and numbness.  She is also known to be blind.  Neurology was consulted and they suspect transverse myelitis or Devic's disease.  She has apparently not responded to steroid therapy and we are asked to place a pheresis catheter today for plasma pheresis.  She is NPO and not on blood thinners.  Past Medical History  Diagnosis Date  . Hypertension   . Blind   . Lupus ([place])   . Kidney stones   . Shortness of breath     due to medication  . Depression   . GERD (gastroesophageal reflux disease)   . Headache(784.0)   . Arthritis   . Stroke [facility]) 2008    visually impaired    Past Surgical History  Procedure Laterality Date  . Tubal ligation    . Tubal ligation    . Breast biopsy      Allergies: Review of patient's allergies indicates no known allergies.  Medications: Prior to Admission medications   Medication Sig Start Date End Date Taking? Authorizing Provider  azaTHIOprine (IMURAN) 50 MG tablet Take 50 mg by mouth daily.  02/10/13  Yes Historical Provider, MD  ibandronate (BONIVA) 150 MG tablet Take 150 mg by mouth every 30 (thirty) days.  02/27/13  Yes Historical Provider, MD  LORazepam (ATIVAN) 0.5 MG tablet Take 0.5 mg by mouth every 8 (eight) hours as needed for anxiety.  02/10/13  Yes Historical Provider, MD  Multiple Vitamin (MULTIVITAMIN) capsule Take 1 capsule by mouth daily.   Yes Historical Provider, MD  omeprazole (PRILOSEC) 20 MG capsule Take 20 mg by mouth daily.  01/18/13  Yes Historical Provider, MD  predniSONE (DELTASONE) 5 MG tablet Take 5 mg by mouth daily with breakfast.   Yes Historical Provider, MD  pregabalin (LYRICA) 50 MG capsule Take 50 mg by mouth 3 (three) times daily.   Yes Historical Provider, MD     Family History  Problem  Relation Age of Onset  . Cancer Mother   . Cancer Father     Social History   Social History  . Marital Status: Single    Spouse Name: N/A  . Number of Children: N/A  . Years of Education: N/A   Social History Main Topics  . Smoking status: Never Smoker   . Smokeless tobacco: None  . Alcohol Use: No  . Drug Use: No  . Sexual Activity: Not Currently   Other Topics Concern  . None   Social History Narrative     Review of Systems  Constitutional: Positive for activity change, appetite change and fatigue. Negative for fever and chills.  HENT: Negative.   Eyes:       Known to be blind  Respiratory: Negative for cough, shortness of breath and wheezing.   Cardiovascular: Negative for chest pain.  Gastrointestinal: Positive for nausea. Negative for vomiting, abdominal pain and abdominal distention.  Genitourinary: Negative.   Musculoskeletal: Negative.   Skin: Negative.   Neurological: Positive for weakness and numbness.  Psychiatric/Behavioral: Negative.     Vital Signs: BP 137/84 mmHg  Pulse 85  Temp(Src) 97.6 F (36.4 C) (Axillary)  Resp 18  Ht 5\' 1"  (1.549 m)  Wt 173 lb 12.8 oz (78.835 kg)  BMI 32.86 kg/m2  SpO2 98%  Physical Exam  Constitutional: She is oriented to  person, place, and time. She appears well-developed and well-nourished.  HENT:  Head: Normocephalic and atraumatic.  Eyes:  Blind  Neck: Normal range of motion. Neck supple. No thyromegaly present.  Cardiovascular: Normal rate, regular rhythm and normal heart sounds.   Pulmonary/Chest: Effort normal and breath sounds normal. No respiratory distress. She has no wheezes.  Abdominal: Soft. Bowel sounds are normal. She exhibits no distension. There is no tenderness.  Musculoskeletal: Normal range of motion.  Neurological: She is alert and oriented to person, place, and time.  Skin: Skin is warm and dry.  Psychiatric: She has a normal mood and affect. Her behavior is normal. Judgment and thought  content normal.  Vitals reviewed.   Mallampati Score:  MD Evaluation Airway: WNL Heart: WNL Abdomen: WNL Chest/ Lungs: WNL ASA  Classification: 3 Mallampati/Airway Score: Two  Imaging: Dg Chest 2 View  09/22/2015  CLINICAL DATA:  TIA. EXAM: CHEST  2 VIEW COMPARISON:  04/29/2012 FINDINGS: Lung volumes are low. Cardiomegaly is unchanged allowing for differences in technique. Minimal bibasilar subsegmental atelectasis no pulmonary edema, consolidation, pleural effusion or pneumothorax. No acute osseous abnormalities are seen. IMPRESSION: Low lung volumes with stable cardiomegaly. Electronically Signed   By: Jeb Levering M.D.   On: 09/22/2015 03:36   Ct Head Wo Contrast  09/21/2015  CLINICAL DATA:  Acute left-sided weakness. EXAM: CT HEAD WITHOUT CONTRAST TECHNIQUE: Contiguous axial images were obtained from the base of the skull through the vertex without intravenous contrast. COMPARISON:  CT scan of June 24, 2014. FINDINGS: Bony calvarium appears intact. No mass effect or midline shift is noted. Ventricular size is within normal limits. There is no evidence of mass lesion, hemorrhage or acute infarction. IMPRESSION: Normal head CT. Electronically Signed   By: Marijo Conception, M.D.   On: 09/21/2015 16:27   Mr Brain Wo Contrast  09/21/2015  CLINICAL DATA:  Left arm numbness and pain. Neck pain. Stroke versus cervical spine disease. EXAM: MRI HEAD WITHOUT CONTRAST MRI CERVICAL SPINE WITHOUT CONTRAST TECHNIQUE: Multiplanar, multiecho pulse sequences of the brain and surrounding structures, and cervical spine, to include the craniocervical junction and cervicothoracic junction, were obtained without intravenous contrast. COMPARISON:  Head CT earlier same day.  MRI 06/24/2014 FINDINGS: MRI HEAD FINDINGS There is a 1 cm region of T2 and FLAIR abnormality within the left thalamus. No restricted diffusion presently. On the old MRI, there appear to be an old small vessel insult in this location.  Elsewhere, the cerebral hemispheres show a few punctate foci of T2 and FLAIR signal in the deep white matter. No cortical or large vessel territory insult. No mass lesion, hemorrhage, hydrocephalus or extra-axial collection. No pituitary mass. No inflammatory sinus disease. No skull or skullbase lesion. MRI CERVICAL SPINE FINDINGS The cervical spinal cord is diffusely abnormal showing enlargement and inter medullary T2 signal. This extends from the obex all the way to the C7 level. This is consistent with acute deep mild is a shin or myelitis. There are no degenerative changes. There is no compressive pathology. IMPRESSION: 1 cm region of abnormal T2 and FLAIR signal within the left thalamus not consistent with acute infarction. This could be an acute focus of demyelination or conceivably but less likely a late subacute infarction. Diffusely abnormal cervical spinal cord which is swollen and shows diffuse abnormal T2 signal. This is likely to represent acute demyelination/neuromyelitis optica (Devic's disease) or acute transverse myelitis. Quite likely that the findings of the left thalamus in the spinal cord are due to the  same pathology. Electronically Signed   By: Nelson Chimes M.D.   On: 09/21/2015 20:41   Mr Jeri Cos Contrast  09/22/2015  CLINICAL DATA:  50 yo female with his history of bilateral optic neuritis now with longitudinally extensive transverse myelitis. Weakness of arms and legs. EXAM: MRI CERVICAL SPINE WITH CONTRAST; MRI HEAD WITH CONTRAST TECHNIQUE: Multiplanar and multiecho pulse sequences of the cervical spine, to include the craniocervical junction and cervicothoracic junction, were obtained according to standard protocol with intravenous contrast.; Multiplanar, multiecho pulse sequences of the brain and surrounding structures were obtained according to standard protocol with intravenous contrast CONTRAST:  10mL MULTIHANCE GADOBENATE DIMEGLUMINE 529 MG/ML IV SOLN COMPARISON:  Noncontrast  brain and cervical spine from 07/21/2016. FINDINGS: Post infusion images of the brain demonstrates no abnormal enhancement. LEFT thalamic lesion, T2 and FLAIR hyperintense, probably represents an atypical area of demyelination, but non acute, given the lack of restricted diffusion and absence of blood brain barrier breakdown. In the cervical spine, there is extensive postcontrast enhancement, corresponding to the T2 and FLAIR hyperintensity on yesterday's exam, extending over multiple cervical segments, from the foramen magnum through C7. This enhancement involves both gray and white matter. There is greater involvement of the LEFT hemicord. In the setting of a patient with history of optic neuritis, the findings are consistent with Devic's disease. No features specific for optic neuritis can be observed on post infusion imaging through the brain, non dedicated orbit study. IMPRESSION: No abnormal intracranial enhancement is documented. Heterogeneous enhancement of the enlarged cervical cord segments, from foramen magnum through C7, involving both gray and white matter, consistent with Devic's disease. Electronically Signed   By: Staci Righter M.D.   On: 09/22/2015 12:39   Mr Cervical Spine Wo Contrast  09/21/2015  CLINICAL DATA:  Left arm numbness and pain. Neck pain. Stroke versus cervical spine disease. EXAM: MRI HEAD WITHOUT CONTRAST MRI CERVICAL SPINE WITHOUT CONTRAST TECHNIQUE: Multiplanar, multiecho pulse sequences of the brain and surrounding structures, and cervical spine, to include the craniocervical junction and cervicothoracic junction, were obtained without intravenous contrast. COMPARISON:  Head CT earlier same day.  MRI 06/24/2014 FINDINGS: MRI HEAD FINDINGS There is a 1 cm region of T2 and FLAIR abnormality within the left thalamus. No restricted diffusion presently. On the old MRI, there appear to be an old small vessel insult in this location. Elsewhere, the cerebral hemispheres show a few  punctate foci of T2 and FLAIR signal in the deep white matter. No cortical or large vessel territory insult. No mass lesion, hemorrhage, hydrocephalus or extra-axial collection. No pituitary mass. No inflammatory sinus disease. No skull or skullbase lesion. MRI CERVICAL SPINE FINDINGS The cervical spinal cord is diffusely abnormal showing enlargement and inter medullary T2 signal. This extends from the obex all the way to the C7 level. This is consistent with acute deep mild is a shin or myelitis. There are no degenerative changes. There is no compressive pathology. IMPRESSION: 1 cm region of abnormal T2 and FLAIR signal within the left thalamus not consistent with acute infarction. This could be an acute focus of demyelination or conceivably but less likely a late subacute infarction. Diffusely abnormal cervical spinal cord which is swollen and shows diffuse abnormal T2 signal. This is likely to represent acute demyelination/neuromyelitis optica (Devic's disease) or acute transverse myelitis. Quite likely that the findings of the left thalamus in the spinal cord are due to the same pathology. Electronically Signed   By: Nelson Chimes M.D.   On: 09/21/2015  20:41   Mr Cervical Spine W Contrast  09/22/2015  CLINICAL DATA:  50 yo female with his history of bilateral optic neuritis now with longitudinally extensive transverse myelitis. Weakness of arms and legs. EXAM: MRI CERVICAL SPINE WITH CONTRAST; MRI HEAD WITH CONTRAST TECHNIQUE: Multiplanar and multiecho pulse sequences of the cervical spine, to include the craniocervical junction and cervicothoracic junction, were obtained according to standard protocol with intravenous contrast.; Multiplanar, multiecho pulse sequences of the brain and surrounding structures were obtained according to standard protocol with intravenous contrast CONTRAST:  10mL MULTIHANCE GADOBENATE DIMEGLUMINE 529 MG/ML IV SOLN COMPARISON:  Noncontrast brain and cervical spine from 07/21/2016.  FINDINGS: Post infusion images of the brain demonstrates no abnormal enhancement. LEFT thalamic lesion, T2 and FLAIR hyperintense, probably represents an atypical area of demyelination, but non acute, given the lack of restricted diffusion and absence of blood brain barrier breakdown. In the cervical spine, there is extensive postcontrast enhancement, corresponding to the T2 and FLAIR hyperintensity on yesterday's exam, extending over multiple cervical segments, from the foramen magnum through C7. This enhancement involves both gray and white matter. There is greater involvement of the LEFT hemicord. In the setting of a patient with history of optic neuritis, the findings are consistent with Devic's disease. No features specific for optic neuritis can be observed on post infusion imaging through the brain, non dedicated orbit study. IMPRESSION: No abnormal intracranial enhancement is documented. Heterogeneous enhancement of the enlarged cervical cord segments, from foramen magnum through C7, involving both gray and white matter, consistent with Devic's disease. Electronically Signed   By: Staci Righter M.D.   On: 09/22/2015 12:39   Dg Fluoro Guide Lumbar Puncture  09/22/2015  CLINICAL DATA:  Transverse myelitis. EXAM: DIAGNOSTIC LUMBAR PUNCTURE UNDER FLUOROSCOPIC GUIDANCE FLUOROSCOPY TIME:  Fluoroscopy Time (in minutes and seconds): 0 minutes, 37 seconds Number of Acquired Images:  0 PROCEDURE: I discussed the risks (including hemorrhage, infection, headache, and nerve damage, among others), benefits, and alternatives to fluoroscopically guided lumbar puncture with the patient. We specifically discussed the high technical likelihood of success of the procedure. The patient understood and elected to undergo the procedure. Standard time-out was employed. Following sterile skin prep and local anesthetic administration consisting of 1 percent lidocaine, a 22 gauge spinal needle was advanced without difficulty into  the thecal sac at the at the L4-5 level. Clear CSF was returned. Opening pressure was 17 cm of water. 13 cc of clear CSF was collected. The needle was subsequently removed and the skin cleansed and bandaged. No immediate complications were observed. IMPRESSION: 1. Technically successful fluoroscopically guided lumbar puncture at the L4-5 level. No complicating feature observed. Opening pressure 17 cm of water. Electronically Signed   By: Van Clines M.D.   On: 09/22/2015 09:15    Labs:  CBC:  Recent Labs  09/21/15 1422 09/21/15 1600 09/22/15 0351 09/23/15 0433  WBC 3.2*  --  4.8 6.1  HGB 13.7 16.3* 11.7* 12.1  HCT 42.1 48.0* 36.5 38.0  PLT 251  --  236 187    COAGS:  Recent Labs  09/23/15 0433  INR 1.07    BMP:  Recent Labs  09/21/15 1422 09/21/15 1600 09/22/15 0350 09/22/15 0351 09/23/15 0433  NA 135 137  --  137 139  K 4.7 5.4*  --  3.7 5.0  CL 104 106  --  107 110  CO2 19*  --   --  20* 21*  GLUCOSE 91 88  --  135* 124*  BUN  12 18  --  9 9  CALCIUM 10.1  --   --  9.2 9.6  CREATININE 0.80 0.80 0.80 0.77 0.69  GFRNONAA >60  --  >60 >60 >60  GFRAA >60  --  >60 >60 >60    LIVER FUNCTION TESTS:  Recent Labs  09/21/15 1422 09/22/15 0351 09/23/15 0433  BILITOT 1.2 1.8* 0.8  AST 63* 45* 50*  ALT 48 40 48  ALKPHOS 54 50 54  PROT 8.7* 7.6 8.0  ALBUMIN 4.4 4.0 3.8    TUMOR MARKERS: No results for input(s): AFPTM, CEA, CA199, CHROMGRNA in the last 8760 hours.  Assessment and Plan:  Left arm weakness, numbness. Lupus. Neurology suspects Devic's disease  Will proceed with placement of pheresis catheter for plasma pheresis today.  Risks and Benefits discussed with the patient including, but not limited to bleeding, infection, pneumothorax, or fibrin sheath development and need for additional procedures.  All of the patient's questions were answered, patient is agreeable to proceed. Consent signed and in chart.  Thank you for this interesting  consult.  I greatly enjoyed meeting NOURA SALIZAR and look forward to participating in their care.  A copy of this report was sent to the requesting provider on this date.  Electronically Signed: Murrell Redden  PA-C  09/23/2015, 8:29 AM   I spent a total of 40 Minutes in face to face in clinical consultation, greater than 50% of which was counseling/coordinating care for placement of pheresis catheter.

## 2015-09-23 NOTE — Progress Notes (Addendum)
Occupational Therapy Evaluation Patient Details Name: Andrea Berry MRN: [id number] DOB: [birth year] Today's Date: 09/23/2015    History of Present Illness 50 yo female initially presenting with Lt UE weakness and numbness. Pt has a history of bilateral optic neuritislitis, consistent with Devic disease. PMH: hypertension, blind, lupus, depression, CVA 2008.   Clinical Impression   PTA, pt mod I with ADL and mobility and worked at [employer] for [employer]. Pt demontrates significant functional deficits as listed below. Pt is demonstrating active movement in LUE (able to flex/extend elbow, gross flexion/extension L hand). spasticity in LUE. ? Spasms in upper traps. Given pt's level of independence PTA and improving symptoms, recommend CIR for rehab. Pt very motivated to return to prior level of independence. Will follow acutely to maximize functional level of independence and facilitate D/C to next venue of care.     Follow Up Recommendations  CIR;Supervision/Assistance - 24 hour    Equipment Recommendations  3 in 1 bedside comode    Recommendations for Other Services Rehab consult     Precautions / Restrictions Precautions Precautions: Fall Precaution Comments: Lt side weakness UE >LE, blind Restrictions Weight Bearing Restrictions: No      Mobility Bed Mobility Overal bed mobility: Needs Assistance Bed Mobility: Supine to Sit;Sit to Supine     Supine to sit: Min assist Sit to supine: Min assist   General bed mobility comments: Assist needed with Lt UE/LE.   Transfers Overall transfer level: Needs assistance Equipment used: 1 person hand held assist Transfers: Sit to/from Stand Sit to Stand: Mod assist             Balance Overall balance assessment: Needs assistance Sitting-balance support: No upper extremity supported Sitting balance-Leahy Scale: Fair     Standing balance support: Bilateral upper extremity supported Standing balance-Leahy Scale: Poor                              ADL Overall ADL's : Needs assistance/impaired Eating/Feeding: Set up   Grooming: Minimal assistance;Sitting   Upper Body Bathing: Moderate assistance;Bed level   Lower Body Bathing: Maximal assistance;Bed level   Upper Body Dressing : Moderate assistance   Lower Body Dressing: Maximal assistance               Functional mobility during ADLs: Moderate assistance General ADL Comments: Pt legally blind. Pt educated on location of call bell and using tactile cues for educating on operation of control Pt able to return demosntrate.      Vision Additional Comments: Can see minimally out of L eye.   Perception     Praxis      Pertinent Vitals/Pain Pain Assessment: 0-10 Pain Score: 0-No pain (took pain meds @ 2 hours ago) Pain Location: Lt arm Pain Descriptors / Indicators: Sore Pain Intervention(s): Limited activity within patient's tolerance;Monitored during session     Hand Dominance Right   Extremity/Trunk Assessment Upper Extremity Assessment Upper Extremity Assessment: LUE deficits/detail LUE Deficits / Details: After facilitation, pt able to complete elbow flexion/extension and minimal wrist flex/ext. Demonstrates ability to initiate grasp/release. Minimal active shoulder movement against gravity. Demosntratesactive shoulder adduction. complains of pain in upper traps.? spasms. increased apsticity LUE LUE Sensation: decreased light touch (unable to localize light touch) LUE Coordination: decreased fine motor;decreased gross motor   Lower Extremity Assessment Lower Extremity Assessment: Defer to PT evaluation     Cervical / Trunk Assessment Cervical / Trunk Assessment: Normal  Communication Communication Communication: No difficulties   Cognition Arousal/Alertness: Awake/alert Behavior During Therapy: Flat affect Overall Cognitive Status: Within Functional Limits for tasks assessed                     General  Comments       Exercises Exercises: Other exercises Other Exercises Other Exercises: son present and educated on LUE AAROM and importance of keeping LUE supported on pillows.son verbalized understanding.    Shoulder Instructions      Home Living Family/patient expects to be discharged to:: Private residence Living Arrangements: Children Available Help at Discharge: Family;Available PRN/intermittently Type of Home: House Home Access: Stairs to enter [employer] of Steps: 3 Entrance Stairs-Rails: None Home Layout: One level     Bathroom Shower/Tub: Occupational psychologist: Standard Bathroom Accessibility: Yes How Accessible: Accessible via walker Home Equipment: None   Additional Comments: son lives with patient, not always available      Prior Functioning/Environment Level of Independence: Independent        Comments: working at [employer] for the blind    OT Diagnosis: Generalized weakness;Acute pain;Paresis   OT Problem List: Decreased strength;Decreased range of motion;Decreased activity tolerance;Impaired balance (sitting and/or standing);Decreased knowledge of use of DME or AE;Impaired sensation;Impaired tone;Obesity;Impaired UE functional use;Pain   OT Treatment/Interventions: Self-care/ADL training;Therapeutic exercise;Neuromuscular education;DME and/or AE instruction;Therapeutic activities;Patient/family education;Balance training    OT Goals(Current goals can be found in the care plan section) Acute Rehab OT Goals Patient Stated Goal: return to being independent OT Goal Formulation: With patient Time For Goal Achievement: 10/07/15 Potential to Achieve Goals: Good ADL Goals Pt Will Perform Grooming: with set-up;sitting Pt Will Perform Upper Body Bathing: with set-up;with supervision;sitting Pt Will Perform Lower Body Bathing: with min guard assist;sit to/from stand Pt Will Transfer to Toilet: with min assist;bedside commode;stand pivot  transfer Pt Will Perform Toileting - Clothing Manipulation and hygiene: with min assist;sit to/from stand Additional ADL Goal #1: Use LUE as funcitonal assist for ADL  OT Frequency: Min 2X/week   Barriers to D/C:            Co-evaluation              End of Session Nurse Communication: Mobility status  Activity Tolerance: Patient limited by fatigue Patient left: in bed;with call bell/phone within reach;with family/visitor present;with bed alarm set   Time: 1440-1456 OT Time Calculation (min): 16 min Charges:  OT General Charges $OT Visit: 1 Procedure OT Evaluation $OT Eval Moderate Complexity: 1 Procedure G-Codes:    Esparanza Krider,HILLARY 10-10-15, 6:07 PM   [facility], [place], OTR/L  [phone] Oct 10, 2015

## 2015-09-23 NOTE — Progress Notes (Signed)
Patient Demographics:    Andrea Berry, is a 50 y.o. female, DOB - 21-Dec-1965, [id number]  Admit date - 09/21/2015   Admitting Physician Reubin Milan, MD  Outpatient Primary MD for the patient is [name], NP  LOS - 1   Chief Complaint  Patient presents with  . Extremity Weakness        Subjective:    [name] today has, No headache, No chest pain, No abdominal pain - No Nausea, No new weakness tingling or numbness, No Cough - SOB. +ve severe left arm weakness.   Assessment  & Plan :     1. Severe left arm weakness ongoing for 3 days. Underlying history of legal blindness. Secondary to Devic's disease, neurology on board. Patient on Solu-Medrol and due for plasma exchange as directed by neurology. Further management of this problem defer to neurology.  2. History of lupus. On home dose prednisone, currently on Solu-Medrol along with Imuran. Outpatient follow-up with Dr. Estanislado Pandy postdischarge.  3. History of TIA/stroke in the past. No acute issues.  4. Prolonged QTC. IV magnesium, low-dose beta blocker, monitor on telemetry.  5. Dehydration with tachycardia. Hydrate, monitor. Check baseline TSH and echogram.  6. GERD. On PPI.    Code Status : Full  Family Communication  : son  Disposition Plan  : Stay inpatient  Consults  :  Neurology  Procedures  :   CT head, MRI brain and C-spine consistent with Devic's disease.  Lumbar puncture not consistent with meningitis (D/W - Dr Leonel Ramsay)  Right IJ venous catheter placed by IR on 09/23/2015    DVT Prophylaxis  :  Lovenox  Lab Results  Component Value Date   PLT 187 09/23/2015    Inpatient Medications  Scheduled Meds: .  stroke: mapping our early stages of recovery book   Does not apply Once  .  therapeutic plasma exchange solution   Dialysis Once in dialysis  . azaTHIOprine  50 mg Oral Daily  . calcium gluconate IVPB  4 g Intravenous Once  . [START ON 09/24/2015] enoxaparin (LOVENOX) injection  40 mg Subcutaneous Q0600  . fentaNYL      . heparin      . heparin  1,000 Units Intracatheter Once  . [START ON 09/24/2015] Influenza vac split quadrivalent PF  0.5 mL Intramuscular Tomorrow-1000  . lidocaine      . methylPREDNISolone (SOLU-MEDROL) injection  500 mg Intravenous BID  . metoprolol tartrate  25 mg Oral BID  . midazolam      . multivitamin with minerals  1 tablet Oral Daily  . pantoprazole  40 mg Oral Daily  . pregabalin  50 mg Oral TID  . sodium chloride  3 mL Intravenous Q12H  . sodium chloride  3 mL Intravenous Q12H   Continuous Infusions: . sodium chloride 100 mL/hr at 09/23/15 0500  . citrate dextrose     PRN Meds:.sodium chloride, acetaminophen, diphenhydrAMINE, LORazepam, ondansetron **OR** ondansetron (ZOFRAN) IV, oxyCODONE, senna-docusate, sodium chloride  Antibiotics  :    Anti-infectives    Start     Dose/Rate Route Frequency Ordered Stop   09/23/15 0815  ceFAZolin (ANCEF) IVPB 2 g/50 mL premix     2 g 100 mL/hr over 30 Minutes Intravenous  Once 09/23/15 0814 09/23/15 0922        Objective:   Filed Vitals:   09/23/15 0920 09/23/15 0925 09/23/15 0930 09/23/15 1010  BP: 145/91 152/92 145/91 136/76  Pulse: 94 98 94 91  Temp:    98 F (36.7 C)  TempSrc:    Oral  Resp: 21 21 17 20   Height:      Weight:      SpO2: 98% 99% 99% 98%    Wt Readings from Last 3 Encounters:  09/23/15 78.835 kg (173 lb 12.8 oz)  02/08/14 73.199 kg (161 lb 6 oz)  01/29/14 75.297 kg (166 lb)     Intake/Output Summary (Last 24 hours) at 09/23/15 1217 Last data filed at 09/23/15 0248  Gross per 24 hour  Intake     54 ml  Output      1 ml  Net     53 ml     Physical Exam  Awake Alert, Oriented X 3, No new F.N deficits, left arm strength 1 over 5, legally blind,  Normal affect [place].AT,PERRAL Supple Neck,No JVD, No cervical lymphadenopathy appriciated.  Symmetrical Chest wall movement, Good air movement bilaterally, CTAB RRR,No Gallops,Rubs or new Murmurs, No Parasternal Heave +ve B.Sounds, Abd Soft, No tenderness, No organomegaly appriciated, No rebound - guarding or rigidity. No Cyanosis, Clubbing or edema, No new Rash or bruise       Data Review:   Micro Results Recent Results (from the past 240 hour(s))  CSF culture     Status: None (Preliminary result)   Collection Time: 09/22/15  8:57 AM  Result Value Ref Range Status   Specimen Description CSF TUBE 2  Final   Special Requests NONE  Final   Gram Stain   Final    WBC PRESENT,BOTH PMN AND MONONUCLEAR NO ORGANISMS SEEN CYTOSPIN SMEAR    Culture NO GROWTH < 24 HOURS  Final   Report Status PENDING  Incomplete    Radiology Reports Dg Chest 2 View  09/22/2015  CLINICAL DATA:  TIA. EXAM: CHEST  2 VIEW COMPARISON:  04/29/2012 FINDINGS: Lung volumes are low. Cardiomegaly is unchanged allowing for differences in technique. Minimal bibasilar subsegmental atelectasis no pulmonary edema, consolidation, pleural effusion or pneumothorax. No acute osseous abnormalities are seen. IMPRESSION: Low lung volumes with stable cardiomegaly. Electronically Signed   By: Jeb Levering M.D.   On: 09/22/2015 03:36   Ct Head Wo Contrast  09/21/2015  CLINICAL DATA:  Acute left-sided weakness. EXAM: CT HEAD WITHOUT CONTRAST TECHNIQUE: Contiguous axial images were obtained from the base of the skull through the vertex without intravenous contrast. COMPARISON:  CT scan of June 24, 2014. FINDINGS: Bony calvarium appears intact. No mass effect or midline shift is noted. Ventricular size is within normal limits. There is no evidence of mass lesion, hemorrhage or acute infarction. IMPRESSION: Normal head CT. Electronically Signed   By: Marijo Conception, M.D.   On: 09/21/2015 16:27   Mr Brain Wo Contrast  09/21/2015   CLINICAL DATA:  Left arm numbness and pain. Neck pain. Stroke versus cervical spine disease. EXAM: MRI HEAD WITHOUT CONTRAST MRI CERVICAL SPINE WITHOUT CONTRAST TECHNIQUE: Multiplanar, multiecho pulse sequences of the brain and surrounding structures, and cervical spine, to include the craniocervical junction and cervicothoracic junction, were obtained without intravenous contrast. COMPARISON:  Head CT earlier same day.  MRI 06/24/2014 FINDINGS: MRI HEAD FINDINGS There is a 1 cm region of T2 and FLAIR abnormality within the left thalamus. No restricted diffusion presently. On  the old MRI, there appear to be an old small vessel insult in this location. Elsewhere, the cerebral hemispheres show a few punctate foci of T2 and FLAIR signal in the deep white matter. No cortical or large vessel territory insult. No mass lesion, hemorrhage, hydrocephalus or extra-axial collection. No pituitary mass. No inflammatory sinus disease. No skull or skullbase lesion. MRI CERVICAL SPINE FINDINGS The cervical spinal cord is diffusely abnormal showing enlargement and inter medullary T2 signal. This extends from the obex all the way to the C7 level. This is consistent with acute deep mild is a shin or myelitis. There are no degenerative changes. There is no compressive pathology. IMPRESSION: 1 cm region of abnormal T2 and FLAIR signal within the left thalamus not consistent with acute infarction. This could be an acute focus of demyelination or conceivably but less likely a late subacute infarction. Diffusely abnormal cervical spinal cord which is swollen and shows diffuse abnormal T2 signal. This is likely to represent acute demyelination/neuromyelitis optica (Devic's disease) or acute transverse myelitis. Quite likely that the findings of the left thalamus in the spinal cord are due to the same pathology. Electronically Signed   By: Nelson Chimes M.D.   On: 09/21/2015 20:41   Mr Jeri Cos Contrast  09/22/2015  CLINICAL DATA:  50 yo  female with his history of bilateral optic neuritis now with longitudinally extensive transverse myelitis. Weakness of arms and legs. EXAM: MRI CERVICAL SPINE WITH CONTRAST; MRI HEAD WITH CONTRAST TECHNIQUE: Multiplanar and multiecho pulse sequences of the cervical spine, to include the craniocervical junction and cervicothoracic junction, were obtained according to standard protocol with intravenous contrast.; Multiplanar, multiecho pulse sequences of the brain and surrounding structures were obtained according to standard protocol with intravenous contrast CONTRAST:  39mL MULTIHANCE GADOBENATE DIMEGLUMINE 529 MG/ML IV SOLN COMPARISON:  Noncontrast brain and cervical spine from 07/21/2016. FINDINGS: Post infusion images of the brain demonstrates no abnormal enhancement. LEFT thalamic lesion, T2 and FLAIR hyperintense, probably represents an atypical area of demyelination, but non acute, given the lack of restricted diffusion and absence of blood brain barrier breakdown. In the cervical spine, there is extensive postcontrast enhancement, corresponding to the T2 and FLAIR hyperintensity on yesterday's exam, extending over multiple cervical segments, from the foramen magnum through C7. This enhancement involves both gray and white matter. There is greater involvement of the LEFT hemicord. In the setting of a patient with history of optic neuritis, the findings are consistent with Devic's disease. No features specific for optic neuritis can be observed on post infusion imaging through the brain, non dedicated orbit study. IMPRESSION: No abnormal intracranial enhancement is documented. Heterogeneous enhancement of the enlarged cervical cord segments, from foramen magnum through C7, involving both gray and white matter, consistent with Devic's disease. Electronically Signed   By: Staci Righter M.D.   On: 09/22/2015 12:39   Mr Cervical Spine Wo Contrast  09/21/2015  CLINICAL DATA:  Left arm numbness and pain. Neck  pain. Stroke versus cervical spine disease. EXAM: MRI HEAD WITHOUT CONTRAST MRI CERVICAL SPINE WITHOUT CONTRAST TECHNIQUE: Multiplanar, multiecho pulse sequences of the brain and surrounding structures, and cervical spine, to include the craniocervical junction and cervicothoracic junction, were obtained without intravenous contrast. COMPARISON:  Head CT earlier same day.  MRI 06/24/2014 FINDINGS: MRI HEAD FINDINGS There is a 1 cm region of T2 and FLAIR abnormality within the left thalamus. No restricted diffusion presently. On the old MRI, there appear to be an old small vessel insult in this location.  Elsewhere, the cerebral hemispheres show a few punctate foci of T2 and FLAIR signal in the deep white matter. No cortical or large vessel territory insult. No mass lesion, hemorrhage, hydrocephalus or extra-axial collection. No pituitary mass. No inflammatory sinus disease. No skull or skullbase lesion. MRI CERVICAL SPINE FINDINGS The cervical spinal cord is diffusely abnormal showing enlargement and inter medullary T2 signal. This extends from the obex all the way to the C7 level. This is consistent with acute deep mild is a shin or myelitis. There are no degenerative changes. There is no compressive pathology. IMPRESSION: 1 cm region of abnormal T2 and FLAIR signal within the left thalamus not consistent with acute infarction. This could be an acute focus of demyelination or conceivably but less likely a late subacute infarction. Diffusely abnormal cervical spinal cord which is swollen and shows diffuse abnormal T2 signal. This is likely to represent acute demyelination/neuromyelitis optica (Devic's disease) or acute transverse myelitis. Quite likely that the findings of the left thalamus in the spinal cord are due to the same pathology. Electronically Signed   By: Nelson Chimes M.D.   On: 09/21/2015 20:41   Mr Cervical Spine W Contrast  09/22/2015  CLINICAL DATA:  49 yo female with his history of bilateral optic  neuritis now with longitudinally extensive transverse myelitis. Weakness of arms and legs. EXAM: MRI CERVICAL SPINE WITH CONTRAST; MRI HEAD WITH CONTRAST TECHNIQUE: Multiplanar and multiecho pulse sequences of the cervical spine, to include the craniocervical junction and cervicothoracic junction, were obtained according to standard protocol with intravenous contrast.; Multiplanar, multiecho pulse sequences of the brain and surrounding structures were obtained according to standard protocol with intravenous contrast CONTRAST:  86mL MULTIHANCE GADOBENATE DIMEGLUMINE 529 MG/ML IV SOLN COMPARISON:  Noncontrast brain and cervical spine from 07/21/2016. FINDINGS: Post infusion images of the brain demonstrates no abnormal enhancement. LEFT thalamic lesion, T2 and FLAIR hyperintense, probably represents an atypical area of demyelination, but non acute, given the lack of restricted diffusion and absence of blood brain barrier breakdown. In the cervical spine, there is extensive postcontrast enhancement, corresponding to the T2 and FLAIR hyperintensity on yesterday's exam, extending over multiple cervical segments, from the foramen magnum through C7. This enhancement involves both gray and white matter. There is greater involvement of the LEFT hemicord. In the setting of a patient with history of optic neuritis, the findings are consistent with Devic's disease. No features specific for optic neuritis can be observed on post infusion imaging through the brain, non dedicated orbit study. IMPRESSION: No abnormal intracranial enhancement is documented. Heterogeneous enhancement of the enlarged cervical cord segments, from foramen magnum through C7, involving both gray and white matter, consistent with Devic's disease. Electronically Signed   By: Staci Righter M.D.   On: 09/22/2015 12:39   Dg Fluoro Guide Lumbar Puncture  09/22/2015  CLINICAL DATA:  Transverse myelitis. EXAM: DIAGNOSTIC LUMBAR PUNCTURE UNDER FLUOROSCOPIC  GUIDANCE FLUOROSCOPY TIME:  Fluoroscopy Time (in minutes and seconds): 0 minutes, 37 seconds Number of Acquired Images:  0 PROCEDURE: I discussed the risks (including hemorrhage, infection, headache, and nerve damage, among others), benefits, and alternatives to fluoroscopically guided lumbar puncture with the patient. We specifically discussed the high technical likelihood of success of the procedure. The patient understood and elected to undergo the procedure. Standard time-out was employed. Following sterile skin prep and local anesthetic administration consisting of 1 percent lidocaine, a 22 gauge spinal needle was advanced without difficulty into the thecal sac at the at the L4-5 level. Clear CSF  was returned. Opening pressure was 17 cm of water. 13 cc of clear CSF was collected. The needle was subsequently removed and the skin cleansed and bandaged. No immediate complications were observed. IMPRESSION: 1. Technically successful fluoroscopically guided lumbar puncture at the L4-5 level. No complicating feature observed. Opening pressure 17 cm of water. Electronically Signed   By: Van Clines M.D.   On: 09/22/2015 09:15     CBC  Recent Labs Lab 09/21/15 1422 09/21/15 1600 09/22/15 0351 09/23/15 0433  WBC 3.2*  --  4.8 6.1  HGB 13.7 16.3* 11.7* 12.1  HCT 42.1 48.0* 36.5 38.0  PLT 251  --  236 187  MCV 72.1*  --  71.3* 72.5*  MCH 23.5*  --  22.9* 23.1*  MCHC 32.5  --  32.1 31.8  RDW 14.3  --  14.0 14.4  LYMPHSABS 0.8  --  0.3* 0.4*  MONOABS 0.4  --  0.2 0.1  EOSABS 0.0  --  0.0 0.0  BASOSABS 0.0  --  0.0 0.0    Chemistries   Recent Labs Lab 09/21/15 1422 09/21/15 1600 09/22/15 0350 09/22/15 0351 09/23/15 0433  NA 135 137  --  137 139  K 4.7 5.4*  --  3.7 5.0  CL 104 106  --  107 110  CO2 19*  --   --  20* 21*  GLUCOSE 91 88  --  135* 124*  BUN 12 18  --  9 9  CREATININE 0.80 0.80 0.80 0.77 0.69  CALCIUM 10.1  --   --  9.2 9.6  MG  --   --   --  1.8  --   AST 63*   --   --  45* 50*  ALT 48  --   --  40 48  ALKPHOS 54  --   --  50 54  BILITOT 1.2  --   --  1.8* 0.8   ------------------------------------------------------------------------------------------------------------------  Recent Labs  09/22/15 0344  CHOL 188  HDL 38*  LDLCALC 129*  TRIG 104  CHOLHDL 4.9    Lab Results  Component Value Date   HGBA1C 6.0* 09/22/2015   ------------------------------------------------------------------------------------------------------------------  Recent Labs  09/22/15 1515  TSH 0.680   ------------------------------------------------------------------------------------------------------------------  Recent Labs  09/21/15 2333  VITAMINB12 361    Coagulation profile  Recent Labs Lab 09/23/15 0433  INR 1.07    No results for input(s): DDIMER in the last 72 hours.  Cardiac Enzymes No results for input(s): CKMB, TROPONINI, MYOGLOBIN in the last 168 hours.  Invalid input(s): CK ------------------------------------------------------------------------------------------------------------------ No results found for: BNP  Time Spent in minutes   35   Jiayi Lengacher K M.D on 09/23/2015 at 12:17 PM  Between 7am to 7pm - Pager - [phone]  After 7pm go to [email or web address] - password [facility]  [facility] -  Office  [phone]

## 2015-09-23 NOTE — Progress Notes (Signed)
Subjective: No acute changes, No significant benefit from steroids yet.   Exam: Filed Vitals:   09/23/15 0229 09/23/15 0527  BP: 124/87 137/84  Pulse: 80 85  Temp: 97.5 F (36.4 C) 97.6 F (36.4 C)  Resp: 18 18   Gen: In bed, NAD Resp: non-labored breathing, no acute distress Abd: soft, nt  Neuro: MS: awake, alert, cooperative,  [id number], pupils are reactive.  Motor: 5/5 on right, 2/5 LUE, 3/5 LLE Sensory:decreased on the left.   Pertinent Labs: cmp - unremarkable No leukocytosis   Impression: 50 yo female with history of bilateral optic neuritis now with longitudinally extensive transverse myelitis. 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 She does have a history of some joint pain, that is the only other symptom of lupus which she has had.  With devic's disease, there is a trend towards improvement with starting plasma exchange concomittently with steroids as opposing to wait for nonresponse to steroids. Given that clinically this fits, I would favor pursuing this. If it is due to non-aquaporin 4 antibody related lupus, then it is likely to be antibody mediated as well and therefore plasma exchange is still be an appropriate therapy.  I do wonder if her primary diagnosis of lupus was based primarily on optic neuritis, if azothiaprine is still indicated, but will need to have her see her rheumatologist to evaluate this.   Recommendations: 1) Solu-Medrol 1 g total daily for 5 days, today is day #2 2) concomitant plasma exchange.  I will arrange for today, tomorrow, then every other day for total five treatments.  3) neurology will continue to follow  Roland Rack, MD [facility] [phone]  If 7pm- 7am, please page neurology on call as listed in [place].

## 2015-09-23 NOTE — CV Procedure (Signed)
RIJV Tunneled HD catheter. 23 cm SVC RA No comp/EBL

## 2015-09-24 DIAGNOSIS — G373 Acute transverse myelitis in demyelinating disease of central nervous system: Secondary | ICD-10-CM | POA: Diagnosis not present

## 2015-09-24 LAB — GLUCOSE, CAPILLARY
GLUCOSE-CAPILLARY: 125 mg/dL — AB (ref 65–99)
Glucose-Capillary: 130 mg/dL — ABNORMAL HIGH (ref 65–99)
Glucose-Capillary: 243 mg/dL — ABNORMAL HIGH (ref 65–99)
Glucose-Capillary: 114 mg/dL — ABNORMAL HIGH (ref 65–99)

## 2015-09-24 LAB — CBC WITH DIFFERENTIAL/PLATELET
BASOS ABS: 0 10*3/uL (ref 0.0–0.1)
Basophils Relative: 0 %
EOS PCT: 0 %
Eosinophils Absolute: 0 10*3/uL (ref 0.0–0.7)
HCT: 37.6 % (ref 36.0–46.0)
Hemoglobin: 11.9 g/dL — ABNORMAL LOW (ref 12.0–15.0)
LYMPHS ABS: 0.6 10*3/uL — AB (ref 0.7–4.0)
LYMPHS PCT: 4 %
MCH: 23.1 pg — AB (ref 26.0–34.0)
MCHC: 31.6 g/dL (ref 30.0–36.0)
MCV: 73 fL — AB (ref 78.0–100.0)
MONO ABS: 0.3 10*3/uL (ref 0.1–1.0)
Monocytes Relative: 2 %
Neutro Abs: 13.8 10*3/uL — ABNORMAL HIGH (ref 1.7–7.7)
Neutrophils Relative %: 94 %
PLATELETS: 203 10*3/uL (ref 150–400)
RBC: 5.15 MIL/uL — ABNORMAL HIGH (ref 3.87–5.11)
RDW: 14.8 % (ref 11.5–15.5)
WBC: 14.7 10*3/uL — ABNORMAL HIGH (ref 4.0–10.5)

## 2015-09-24 LAB — COMPREHENSIVE METABOLIC PANEL
ALBUMIN: 4 g/dL (ref 3.5–5.0)
ALT: 18 U/L (ref 14–54)
AST: 21 U/L (ref 15–41)
Alkaline Phosphatase: 22 U/L — ABNORMAL LOW (ref 38–126)
Anion gap: 5 (ref 5–15)
BILIRUBIN TOTAL: 0.5 mg/dL (ref 0.3–1.2)
BUN: 13 mg/dL (ref 6–20)
CO2: 19 mmol/L — ABNORMAL LOW (ref 22–32)
CREATININE: 0.69 mg/dL (ref 0.44–1.00)
Calcium: 8.8 mg/dL — ABNORMAL LOW (ref 8.9–10.3)
Chloride: 117 mmol/L — ABNORMAL HIGH (ref 101–111)
GFR calc Af Amer: >60 mL/min (ref >60)
GFR calc non Af Amer: >60 mL/min (ref >60)
GLUCOSE: 132 mg/dL — AB (ref 65–99)
POTASSIUM: 4.7 mmol/L (ref 3.5–5.1)
Sodium: 141 mmol/L (ref 135–145)
TOTAL PROTEIN: 5.7 g/dL — AB (ref 6.5–8.1)

## 2015-09-24 LAB — VITAMIN E: ALPHA-TOCOPHEROL: 9.3 mg/L (ref 5.3–16.8)

## 2015-09-24 MED ORDER — DIPHENHYDRAMINE HCL 25 MG PO CAPS
ORAL_CAPSULE | ORAL | Status: AC
  Filled 2015-09-24: qty 1

## 2015-09-24 MED ORDER — HEPARIN SODIUM (PORCINE) 1000 UNIT/ML IJ SOLN: 1000.0000 [IU] | Freq: Once | INTRAMUSCULAR | Status: DC

## 2015-09-24 MED ORDER — SODIUM CHLORIDE 0.9 % IV SOLN
4.0000 g | Freq: Once | INTRAVENOUS | Status: DC
  Filled 2015-09-24: qty 40

## 2015-09-24 MED ORDER — DIPHENHYDRAMINE HCL 25 MG PO CAPS: 25.0000 mg | ORAL_CAPSULE | Freq: Four times a day (QID) | ORAL | Status: DC | PRN

## 2015-09-24 MED ORDER — SODIUM CHLORIDE 0.9 % IV SOLN
INTRAVENOUS | Status: AC
  Administered 2015-09-24 (×2): via INTRAVENOUS_CENTRAL
  Filled 2015-09-24 (×3): qty 200

## 2015-09-24 MED ORDER — ACD FORMULA A 0.73-2.45-2.2 GM/100ML VI SOLN
Status: AC
  Administered 2015-09-24: 16:00:00
  Filled 2015-09-24: qty 500

## 2015-09-24 MED ORDER — INSULIN ASPART 100 UNIT/ML ~~LOC~~ SOLN
0.0000 [IU] | Freq: Three times a day (TID) | SUBCUTANEOUS | Status: DC
  Administered 2015-09-24: 5 [IU] via SUBCUTANEOUS
  Administered 2015-09-25 – 2015-09-28 (×7): 2 [IU] via SUBCUTANEOUS
  Administered 2015-09-29: 3 [IU] via SUBCUTANEOUS
  Administered 2015-09-30 (×2): 2 [IU] via SUBCUTANEOUS
  Administered 2015-10-01 (×2): 3 [IU] via SUBCUTANEOUS
  Administered 2015-10-02 – 2015-10-03 (×2): 2 [IU] via SUBCUTANEOUS
  Administered 2015-10-03: 3 [IU] via SUBCUTANEOUS
  Administered 2015-10-04: 2 [IU] via SUBCUTANEOUS
  Administered 2015-10-04: 3 [IU] via SUBCUTANEOUS
  Administered 2015-10-05: 0 [IU] via SUBCUTANEOUS

## 2015-09-24 MED ORDER — INSULIN ASPART 100 UNIT/ML ~~LOC~~ SOLN
0.0000 [IU] | Freq: Every day | SUBCUTANEOUS | Status: DC
  Administered 2015-09-25: 2 [IU] via SUBCUTANEOUS

## 2015-09-24 MED ORDER — ACETAMINOPHEN 325 MG PO TABS: 650.0000 mg | ORAL_TABLET | ORAL | Status: DC | PRN

## 2015-09-24 MED ORDER — CALCIUM CARBONATE ANTACID 500 MG PO CHEW: 2.0000 | CHEWABLE_TABLET | ORAL | Status: DC

## 2015-09-24 MED ORDER — CALCIUM CARBONATE ANTACID 500 MG PO CHEW
CHEWABLE_TABLET | ORAL | Status: AC
  Filled 2015-09-24: qty 4

## 2015-09-24 MED ORDER — ACD FORMULA A 0.73-2.45-2.2 GM/100ML VI SOLN: 500.0000 mL | Status: DC

## 2015-09-24 NOTE — Clinical Social Work Note (Deleted)
CSW contacted rehab admissions coordinator to get an update on status of consult, consult has not been completed yet.  CSW to continue to follow patient and begin SNF bed search process.  Jones Broom. [place], MSW, [place] 09/24/2015 12:37 PM

## 2015-09-24 NOTE — Progress Notes (Signed)
Subjective: Seems down today.  Exam: Filed Vitals:   09/24/15 0221 09/24/15 0931  BP: 121/80 128/63  Pulse: 82 101  Temp: 97.9 F (36.6 C) 98.6 F (37 C)  Resp: 18 18   Gen: In bed, NAD Resp: non-labored breathing, no acute distress Abd: soft, nt  Neuro: MS: awake, alert, cooperative,  [id number], pupils are reactive.  Motor: 5/5 on right, 2/5 LUE, 3/5 LLE Sensory:decreased on the left.   Pertinent Labs: cmp - unremarkable No leukocytosis   Impression: 50 yo female with history of bilateral optic neuritis now with longitudinally extensive transverse myelitis. 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 She does have a history of some joint pain, that is the only other symptom of lupus which she has had.  With devic's disease, there is a trend towards improvement with starting plasma exchange concomittently with steroids as opposing to wait for nonresponse to steroids. Given that clinically this fits, I would favor pursuing this. If it is due to non-aquaporin 4 antibody related lupus, then it is likely to be antibody mediated as well and therefore plasma exchange is still be an appropriate therapy.  I do wonder if her primary diagnosis of lupus was based primarily on optic neuritis, if azothiaprine is still indicated, but will need to have her see her rheumatologist to evaluate this.   Recommendations: 1) Solu-Medrol 1 g total daily for 5 days, today is day #3 2) concomitant plasma exchange.  Today is treatment #2, then QOD for total 5 treatments.  3) neurology will continue to follow  [place] Rack, MD [facility] [phone]  If 7pm- 7am, please page neurology on call as listed in [place].

## 2015-09-24 NOTE — Progress Notes (Signed)
Patient Demographics:    Andrea Berry, is a 50 y.o. female, DOB - [birth year], [id number]  Admit date - 09/21/2015   Admitting Physician Reubin Milan, MD  Outpatient Primary MD for the patient is [name], NP  LOS - 2   Chief Complaint  Patient presents with  . Extremity Weakness        Subjective:    Andrea Berry today has, No headache, No chest pain, No abdominal pain - No Nausea, No new weakness tingling or numbness, No Cough - SOB. +ve severe left arm weakness.   Assessment  & Plan :     1. Severe left arm weakness ongoing for 3 days. Underlying history of legal blindness. Secondary to Devic's disease, neurology on board. Patient on Solu-Medrol and due for plasma exchange as directed by neurology. Further management of this problem defer to neurology.  2. History of lupus. On home dose prednisone, currently on Solu-Medrol along with Imuran. Outpatient follow-up with Dr. Estanislado Pandy postdischarge.  3. History of TIA/stroke in the past. No acute issues.  4. Prolonged QTC. IV magnesium, low-dose beta blocker, monitor on telemetry.  5. Dehydration with tachycardia. Hydrate, monitor. Check baseline TSH and echogram.  6. GERD. On PPI.  7. Leuokocytosis due to steroids. Afebrile monitor.    Code Status : Full  Family Communication  : son  Disposition Plan  : Stay inpatient  Consults  :  Neurology  Procedures  :   CT head, MRI brain and C-spine consistent with Devic's disease.  Lumbar puncture not consistent with meningitis (D/W - Dr Leonel Ramsay)  Right IJ venous dialysis catheter placed by IR on 09/23/2015    DVT Prophylaxis  :  Lovenox  Lab Results  Component Value Date   PLT 203 09/24/2015    Inpatient Medications  Scheduled Meds: .  stroke: mapping our  early stages of recovery book   Does not apply Once  . azaTHIOprine  50 mg Oral Daily  . enoxaparin (LOVENOX) injection  40 mg Subcutaneous Q0600  . heparin  1,000 Units Intracatheter Once  . Influenza vac split quadrivalent PF  0.5 mL Intramuscular Tomorrow-1000  . methylPREDNISolone (SOLU-MEDROL) injection  500 mg Intravenous BID  . metoprolol tartrate  25 mg Oral BID  . multivitamin with minerals  1 tablet Oral Daily  . pantoprazole  40 mg Oral Daily  . pregabalin  50 mg Oral TID  . sodium chloride  3 mL Intravenous Q12H  . sodium chloride  3 mL Intravenous Q12H   Continuous Infusions: . citrate dextrose     PRN Meds:.sodium chloride, acetaminophen, diphenhydrAMINE, LORazepam, ondansetron **OR** ondansetron (ZOFRAN) IV, oxyCODONE, senna-docusate, sodium chloride  Antibiotics  :    Anti-infectives    Start     Dose/Rate Route Frequency Ordered Stop   09/23/15 0815  ceFAZolin (ANCEF) IVPB 2 g/50 mL premix     2 g 100 mL/hr over 30 Minutes Intravenous  Once 09/23/15 0814 09/23/15 0922        Objective:   Filed Vitals:   09/23/15 1730 09/23/15 2207 09/24/15 0221 09/24/15 0931  BP: 130/91 126/79 121/80 128/63  Pulse: 95 100 82 101  Temp: 97.4 F (36.3 C) 98.8 F (37.1 C) 97.9 F (36.6 C) 98.6 F (  37 C)  TempSrc: Oral Oral Oral Oral  Resp: 17 18 18 18   Height:      Weight:      SpO2: 99% 99% 100% 99%    Wt Readings from Last 3 Encounters:  09/23/15 78.835 kg (173 lb 12.8 oz)  02/08/14 73.199 kg (161 lb 6 oz)  01/29/14 75.297 kg (166 lb)     Intake/Output Summary (Last 24 hours) at 09/24/15 0956 Last data filed at 09/23/15 1919  Gross per 24 hour  Intake      0 ml  Output      1 ml  Net     -1 ml     Physical Exam  Awake Alert, Oriented X 3, No new F.N deficits, left arm strength 1 over 5, legally blind, Normal affect .AT,PERRAL Supple Neck,No JVD, No cervical lymphadenopathy appriciated. Right IJ dialysis catheter in place. Symmetrical Chest wall  movement, Good air movement bilaterally, CTAB RRR,No Gallops,Rubs or new Murmurs, No Parasternal Heave +ve B.Sounds, Abd Soft, No tenderness, No organomegaly appriciated, No rebound - guarding or rigidity. No Cyanosis, Clubbing or edema, No new Rash or bruise       Data Review:   Micro Results Recent Results (from the past 240 hour(s))  CSF culture     Status: None (Preliminary result)   Collection Time: 09/22/15  8:57 AM  Result Value Ref Range Status   Specimen Description CSF TUBE 2  Final   Special Requests NONE  Final   Gram Stain   Final    WBC PRESENT,BOTH PMN AND MONONUCLEAR NO ORGANISMS SEEN CYTOSPIN SMEAR    Culture NO GROWTH 2 DAYS  Final   Report Status PENDING  Incomplete    Radiology Reports Dg Chest 2 View  09/22/2015  CLINICAL DATA:  TIA. EXAM: CHEST  2 VIEW COMPARISON:  04/29/2012 FINDINGS: Lung volumes are low. Cardiomegaly is unchanged allowing for differences in technique. Minimal bibasilar subsegmental atelectasis no pulmonary edema, consolidation, pleural effusion or pneumothorax. No acute osseous abnormalities are seen. IMPRESSION: Low lung volumes with stable cardiomegaly. Electronically Signed   By: Jeb Levering M.D.   On: 09/22/2015 03:36   Ct Head Wo Contrast  09/21/2015  CLINICAL DATA:  Acute left-sided weakness. EXAM: CT HEAD WITHOUT CONTRAST TECHNIQUE: Contiguous axial images were obtained from the base of the skull through the vertex without intravenous contrast. COMPARISON:  CT scan of June 24, 2014. FINDINGS: Bony calvarium appears intact. No mass effect or midline shift is noted. Ventricular size is within normal limits. There is no evidence of mass lesion, hemorrhage or acute infarction. IMPRESSION: Normal head CT. Electronically Signed   By: Marijo Conception, M.D.   On: 09/21/2015 16:27   Mr Brain Wo Contrast  09/21/2015  CLINICAL DATA:  Left arm numbness and pain. Neck pain. Stroke versus cervical spine disease. EXAM: MRI HEAD WITHOUT  CONTRAST MRI CERVICAL SPINE WITHOUT CONTRAST TECHNIQUE: Multiplanar, multiecho pulse sequences of the brain and surrounding structures, and cervical spine, to include the craniocervical junction and cervicothoracic junction, were obtained without intravenous contrast. COMPARISON:  Head CT earlier same day.  MRI 06/24/2014 FINDINGS: MRI HEAD FINDINGS There is a 1 cm region of T2 and FLAIR abnormality within the left thalamus. No restricted diffusion presently. On the old MRI, there appear to be an old small vessel insult in this location. Elsewhere, the cerebral hemispheres show a few punctate foci of T2 and FLAIR signal in the deep white matter. No cortical or large vessel territory insult.  No mass lesion, hemorrhage, hydrocephalus or extra-axial collection. No pituitary mass. No inflammatory sinus disease. No skull or skullbase lesion. MRI CERVICAL SPINE FINDINGS The cervical spinal cord is diffusely abnormal showing enlargement and inter medullary T2 signal. This extends from the obex all the way to the C7 level. This is consistent with acute deep mild is a shin or myelitis. There are no degenerative changes. There is no compressive pathology. IMPRESSION: 1 cm region of abnormal T2 and FLAIR signal within the left thalamus not consistent with acute infarction. This could be an acute focus of demyelination or conceivably but less likely a late subacute infarction. Diffusely abnormal cervical spinal cord which is swollen and shows diffuse abnormal T2 signal. This is likely to represent acute demyelination/neuromyelitis optica (Devic's disease) or acute transverse myelitis. Quite likely that the findings of the left thalamus in the spinal cord are due to the same pathology. Electronically Signed   By: Nelson Chimes M.D.   On: 09/21/2015 20:41   Mr Jeri Cos Contrast  09/22/2015  CLINICAL DATA:  50 yo female with his history of bilateral optic neuritis now with longitudinally extensive transverse myelitis. Weakness of  arms and legs. EXAM: MRI CERVICAL SPINE WITH CONTRAST; MRI HEAD WITH CONTRAST TECHNIQUE: Multiplanar and multiecho pulse sequences of the cervical spine, to include the craniocervical junction and cervicothoracic junction, were obtained according to standard protocol with intravenous contrast.; Multiplanar, multiecho pulse sequences of the brain and surrounding structures were obtained according to standard protocol with intravenous contrast CONTRAST:  77mL MULTIHANCE GADOBENATE DIMEGLUMINE 529 MG/ML IV SOLN COMPARISON:  Noncontrast brain and cervical spine from 07/21/2016. FINDINGS: Post infusion images of the brain demonstrates no abnormal enhancement. LEFT thalamic lesion, T2 and FLAIR hyperintense, probably represents an atypical area of demyelination, but non acute, given the lack of restricted diffusion and absence of blood brain barrier breakdown. In the cervical spine, there is extensive postcontrast enhancement, corresponding to the T2 and FLAIR hyperintensity on yesterday's exam, extending over multiple cervical segments, from the foramen magnum through C7. This enhancement involves both gray and white matter. There is greater involvement of the LEFT hemicord. In the setting of a patient with history of optic neuritis, the findings are consistent with Devic's disease. No features specific for optic neuritis can be observed on post infusion imaging through the brain, non dedicated orbit study. IMPRESSION: No abnormal intracranial enhancement is documented. Heterogeneous enhancement of the enlarged cervical cord segments, from foramen magnum through C7, involving both gray and white matter, consistent with Devic's disease. Electronically Signed   By: Staci Righter M.D.   On: 09/22/2015 12:39   Mr Cervical Spine Wo Contrast  09/21/2015  CLINICAL DATA:  Left arm numbness and pain. Neck pain. Stroke versus cervical spine disease. EXAM: MRI HEAD WITHOUT CONTRAST MRI CERVICAL SPINE WITHOUT CONTRAST TECHNIQUE:  Multiplanar, multiecho pulse sequences of the brain and surrounding structures, and cervical spine, to include the craniocervical junction and cervicothoracic junction, were obtained without intravenous contrast. COMPARISON:  Head CT earlier same day.  MRI 06/24/2014 FINDINGS: MRI HEAD FINDINGS There is a 1 cm region of T2 and FLAIR abnormality within the left thalamus. No restricted diffusion presently. On the old MRI, there appear to be an old small vessel insult in this location. Elsewhere, the cerebral hemispheres show a few punctate foci of T2 and FLAIR signal in the deep white matter. No cortical or large vessel territory insult. No mass lesion, hemorrhage, hydrocephalus or extra-axial collection. No pituitary mass. No inflammatory sinus disease.  No skull or skullbase lesion. MRI CERVICAL SPINE FINDINGS The cervical spinal cord is diffusely abnormal showing enlargement and inter medullary T2 signal. This extends from the obex all the way to the C7 level. This is consistent with acute deep mild is a shin or myelitis. There are no degenerative changes. There is no compressive pathology. IMPRESSION: 1 cm region of abnormal T2 and FLAIR signal within the left thalamus not consistent with acute infarction. This could be an acute focus of demyelination or conceivably but less likely a late subacute infarction. Diffusely abnormal cervical spinal cord which is swollen and shows diffuse abnormal T2 signal. This is likely to represent acute demyelination/neuromyelitis optica (Devic's disease) or acute transverse myelitis. Quite likely that the findings of the left thalamus in the spinal cord are due to the same pathology. Electronically Signed   By: Nelson Chimes M.D.   On: 09/21/2015 20:41   Mr Cervical Spine W Contrast  09/22/2015  CLINICAL DATA:  50 yo female with his history of bilateral optic neuritis now with longitudinally extensive transverse myelitis. Weakness of arms and legs. EXAM: MRI CERVICAL SPINE WITH  CONTRAST; MRI HEAD WITH CONTRAST TECHNIQUE: Multiplanar and multiecho pulse sequences of the cervical spine, to include the craniocervical junction and cervicothoracic junction, were obtained according to standard protocol with intravenous contrast.; Multiplanar, multiecho pulse sequences of the brain and surrounding structures were obtained according to standard protocol with intravenous contrast CONTRAST:  90mL MULTIHANCE GADOBENATE DIMEGLUMINE 529 MG/ML IV SOLN COMPARISON:  Noncontrast brain and cervical spine from 07/21/2016. FINDINGS: Post infusion images of the brain demonstrates no abnormal enhancement. LEFT thalamic lesion, T2 and FLAIR hyperintense, probably represents an atypical area of demyelination, but non acute, given the lack of restricted diffusion and absence of blood brain barrier breakdown. In the cervical spine, there is extensive postcontrast enhancement, corresponding to the T2 and FLAIR hyperintensity on yesterday's exam, extending over multiple cervical segments, from the foramen magnum through C7. This enhancement involves both gray and white matter. There is greater involvement of the LEFT hemicord. In the setting of a patient with history of optic neuritis, the findings are consistent with Devic's disease. No features specific for optic neuritis can be observed on post infusion imaging through the brain, non dedicated orbit study. IMPRESSION: No abnormal intracranial enhancement is documented. Heterogeneous enhancement of the enlarged cervical cord segments, from foramen magnum through C7, involving both gray and white matter, consistent with Devic's disease. Electronically Signed   By: Staci Righter M.D.   On: 09/22/2015 12:39   Ir Fluoro Guide Cv Line Right  09/23/2015  CLINICAL DATA:  Transverse myelitis.  Plasmapheresis. EXAM: TUNNELED DIALYSIS CATHETER PLACEMENT, ULTRASOUND GUIDANCE FOR VASCULAR ACCESS FLUOROSCOPY TIME:  24 seconds MEDICATIONS AND MEDICAL HISTORY: Versed 1.5 mg,  Fentanyl 75 mcg. Additional Medications: As antibiotic prophylaxis, Ancef was ordered pre-procedure and administered intravenously within one hour of incision. ANESTHESIA/SEDATION: Moderate sedation time: 20 minutes CONTRAST:  None PROCEDURE: The procedure, risks, benefits, and alternatives were explained to the patient. Questions regarding the procedure were encouraged and answered. The patient understands and consents to the procedure. The right neck was prepped with ChloraPrep in a sterile fashion, and a sterile drape was applied covering the operative field. A sterile gown and sterile gloves were used for the procedure. 1% lidocaine into the skin and subcutaneous tissue. The right jugular vein was noted to be patent initially with ultrasound. Under sonographic guidance, a micropuncture needle was inserted into the right IJ vein (Ultrasound and fluoroscopic image  documentation was performed). It was removed over an 018 wire which was upsized to an Amplatz. This was advanced into the IVC. A small incision was made in the right upper chest. The tunneling device was utilized to advance the 23 centimeter tip to cuff catheter from the chest incision and out the neck incision. A peel-away sheath was advanced over the Amplatz wire. The leading edge of the catheter was then advanced through the peel-away sheath. The peel-away sheath was removed. It was flushed and instilled with heparin. The chest incision was closed with a 0 Prolene pursestring stitch. The neck incision was closed with a 4-0 Vicryl subcuticular stitch. COMPLICATIONS: None FINDINGS: The image demonstrates placement of a tunneled dialysis catheter with its tip in the right atrium. IMPRESSION: Successful right IJ vein tunneled dialysis catheter with its tip in the right atrium. Electronically Signed   By: Marybelle Killings M.D.   On: 09/23/2015 12:33   Ir US Guide Vasc Access Right  09/23/2015  CLINICAL DATA:  Transverse myelitis.  Plasmapheresis. EXAM:  TUNNELED DIALYSIS CATHETER PLACEMENT, ULTRASOUND GUIDANCE FOR VASCULAR ACCESS FLUOROSCOPY TIME:  24 seconds MEDICATIONS AND MEDICAL HISTORY: Versed 1.5 mg, Fentanyl 75 mcg. Additional Medications: As antibiotic prophylaxis, Ancef was ordered pre-procedure and administered intravenously within one hour of incision. ANESTHESIA/SEDATION: Moderate sedation time: 20 minutes CONTRAST:  None PROCEDURE: The procedure, risks, benefits, and alternatives were explained to the patient. Questions regarding the procedure were encouraged and answered. The patient understands and consents to the procedure. The right neck was prepped with ChloraPrep in a sterile fashion, and a sterile drape was applied covering the operative field. A sterile gown and sterile gloves were used for the procedure. 1% lidocaine into the skin and subcutaneous tissue. The right jugular vein was noted to be patent initially with ultrasound. Under sonographic guidance, a micropuncture needle was inserted into the right IJ vein (Ultrasound and fluoroscopic image documentation was performed). It was removed over an 018 wire which was upsized to an Amplatz. This was advanced into the IVC. A small incision was made in the right upper chest. The tunneling device was utilized to advance the 23 centimeter tip to cuff catheter from the chest incision and out the neck incision. A peel-away sheath was advanced over the Amplatz wire. The leading edge of the catheter was then advanced through the peel-away sheath. The peel-away sheath was removed. It was flushed and instilled with heparin. The chest incision was closed with a 0 Prolene pursestring stitch. The neck incision was closed with a 4-0 Vicryl subcuticular stitch. COMPLICATIONS: None FINDINGS: The image demonstrates placement of a tunneled dialysis catheter with its tip in the right atrium. IMPRESSION: Successful right IJ vein tunneled dialysis catheter with its tip in the right atrium. Electronically Signed    By: Marybelle Killings M.D.   On: 09/23/2015 12:33   Dg Fluoro Guide Lumbar Puncture  09/22/2015  CLINICAL DATA:  Transverse myelitis. EXAM: DIAGNOSTIC LUMBAR PUNCTURE UNDER FLUOROSCOPIC GUIDANCE FLUOROSCOPY TIME:  Fluoroscopy Time (in minutes and seconds): 0 minutes, 37 seconds Number of Acquired Images:  0 PROCEDURE: I discussed the risks (including hemorrhage, infection, headache, and nerve damage, among others), benefits, and alternatives to fluoroscopically guided lumbar puncture with the patient. We specifically discussed the high technical likelihood of success of the procedure. The patient understood and elected to undergo the procedure. Standard time-out was employed. Following sterile skin prep and local anesthetic administration consisting of 1 percent lidocaine, a 22 gauge spinal needle was advanced without difficulty into  the thecal sac at the at the L4-5 level. Clear CSF was returned. Opening pressure was 17 cm of water. 13 cc of clear CSF was collected. The needle was subsequently removed and the skin cleansed and bandaged. No immediate complications were observed. IMPRESSION: 1. Technically successful fluoroscopically guided lumbar puncture at the L4-5 level. No complicating feature observed. Opening pressure 17 cm of water. Electronically Signed   By: Van Clines M.D.   On: 09/22/2015 09:15     CBC  Recent Labs Lab 09/21/15 1422 09/21/15 1600 09/22/15 0351 09/23/15 0433 09/24/15 0415  WBC 3.2*  --  4.8 6.1 14.7*  HGB 13.7 16.3* 11.7* 12.1 11.9*  HCT 42.1 48.0* 36.5 38.0 37.6  PLT 251  --  236 187 203  MCV 72.1*  --  71.3* 72.5* 73.0*  MCH 23.5*  --  22.9* 23.1* 23.1*  MCHC 32.5  --  32.1 31.8 31.6  RDW 14.3  --  14.0 14.4 14.8  LYMPHSABS 0.8  --  0.3* 0.4* 0.6*  MONOABS 0.4  --  0.2 0.1 0.3  EOSABS 0.0  --  0.0 0.0 0.0  BASOSABS 0.0  --  0.0 0.0 0.0    Chemistries   Recent Labs Lab 09/21/15 1422 09/21/15 1600 09/22/15 0350 09/22/15 0351 09/23/15 0433  09/24/15 0415  NA 135 137  --  137 139 141  K 4.7 5.4*  --  3.7 5.0 4.7  CL 104 106  --  107 110 117*  CO2 19*  --   --  20* 21* 19*  GLUCOSE 91 88  --  135* 124* 132*  BUN 12 18  --  9 9 13   CREATININE 0.80 0.80 0.80 0.77 0.69 0.69  CALCIUM 10.1  --   --  9.2 9.6 8.8*  MG  --   --   --  1.8  --   --   AST 63*  --   --  45* 50* 21  ALT 48  --   --  40 48 18  ALKPHOS 54  --   --  50 54 22*  BILITOT 1.2  --   --  1.8* 0.8 0.5   ------------------------------------------------------------------------------------------------------------------  Recent Labs  09/22/15 0344  CHOL 188  HDL 38*  LDLCALC 129*  TRIG 104  CHOLHDL 4.9    Lab Results  Component Value Date   HGBA1C 6.0* 09/22/2015   ------------------------------------------------------------------------------------------------------------------  Recent Labs  09/22/15 1515  TSH 0.680   ------------------------------------------------------------------------------------------------------------------  Recent Labs  09/21/15 2333  VITAMINB12 361    Coagulation profile  Recent Labs Lab 09/23/15 0433  INR 1.07    No results for input(s): DDIMER in the last 72 hours.  Cardiac Enzymes No results for input(s): CKMB, TROPONINI, MYOGLOBIN in the last 168 hours.  Invalid input(s): CK ------------------------------------------------------------------------------------------------------------------ No results found for: BNP  Time Spent in minutes   35   Lala Lund K M.D on 09/24/2015 at 9:56 AM  Between 7am to 7pm - Pager - [phone]  After 7pm go to [email or web address] - password [facility]  [facility] -  Office  [phone]

## 2015-09-24 NOTE — Progress Notes (Signed)
TPE #2/5. Pt tolerated well without issue. Has no complaints other than itching which has been on-going. Medicated with benadryl.

## 2015-09-25 DIAGNOSIS — G458 Other transient cerebral ischemic attacks and related syndromes: Secondary | ICD-10-CM

## 2015-09-25 LAB — GLUCOSE, CAPILLARY
GLUCOSE-CAPILLARY: 132 mg/dL — AB (ref 65–99)
GLUCOSE-CAPILLARY: 132 mg/dL — AB (ref 65–99)
Glucose-Capillary: 115 mg/dL — ABNORMAL HIGH (ref 65–99)

## 2015-09-25 LAB — CBC WITH DIFFERENTIAL/PLATELET
BASOS PCT: 0 %
Basophils Absolute: 0 10*3/uL (ref 0.0–0.1)
Eosinophils Absolute: 0 10*3/uL (ref 0.0–0.7)
Eosinophils Relative: 0 %
HEMATOCRIT: 36.8 % (ref 36.0–46.0)
HEMOGLOBIN: 11.8 g/dL — AB (ref 12.0–15.0)
LYMPHS PCT: 5 %
Lymphs Abs: 0.7 10*3/uL (ref 0.7–4.0)
MCH: 23.2 pg — AB (ref 26.0–34.0)
MCHC: 32.1 g/dL (ref 30.0–36.0)
MCV: 72.4 fL — ABNORMAL LOW (ref 78.0–100.0)
MONOS PCT: 2 %
Monocytes Absolute: 0.3 10*3/uL (ref 0.1–1.0)
NEUTROS ABS: 13.2 10*3/uL — AB (ref 1.7–7.7)
NEUTROS PCT: 93 %
Platelets: 145 10*3/uL — ABNORMAL LOW (ref 150–400)
RBC: 5.08 MIL/uL (ref 3.87–5.11)
RDW: 14.9 % (ref 11.5–15.5)
WBC: 14.2 10*3/uL — ABNORMAL HIGH (ref 4.0–10.5)

## 2015-09-25 LAB — CSF CULTURE: CULTURE: NO GROWTH

## 2015-09-25 LAB — CSF CULTURE W GRAM STAIN

## 2015-09-25 MED ORDER — PREGABALIN 75 MG PO CAPS
75.0000 mg | ORAL_CAPSULE | Freq: Three times a day (TID) | ORAL | Status: DC
  Administered 2015-09-25 – 2015-10-06 (×32): 75 mg via ORAL
  Filled 2015-09-25 (×34): qty 1

## 2015-09-25 NOTE — Progress Notes (Signed)
Patient Demographics:    Andrea Berry, is a 50 y.o. female, DOB - [birth year], FT:[id number]  Admit date - 09/21/2015   Admitting Physician Reubin Milan, MD  Outpatient Primary MD for the patient is [name], NP  LOS - 3   Chief Complaint  Patient presents with  . Extremity Weakness        Subjective:    [name] today has, No headache, No chest pain, No abdominal pain - No Nausea, No new weakness tingling or numbness, No Cough - SOB. +ve severe left arm weakness and mild L leg weakness.   Assessment  & Plan :     1. Severe left arm weakness ongoing for 3 days. Underlying history of legal blindness. Secondary to Devic's disease, neurology on board. Patient on Solu-Medrol  total of 5 doses stop date 09/26/2015 and due for plasma exchange every other day for a total 5 runs to be finished on 09/30/2015 as directed by neurology. Further management of this problem defer to neurology.  2. History of lupus. On home dose prednisone, currently on Solu-Medrol along with Imuran. Outpatient follow-up with Dr. Estanislado Pandy postdischarge.  3. History of TIA/stroke in the past. No acute issues.  4. Prolonged QTC. IV magnesium, low-dose beta blocker, monitor on telemetry.  5. Dehydration with tachycardia. Hydrate, monitor. Check baseline TSH and echogram.  6. GERD. On PPI.  7. Leuokocytosis due to steroids. Afebrile monitor.  8. Steroid induced hyperglycemia. I assess and monitor.  CBG (last 3)   Recent Labs  09/24/15 1807 09/24/15 2116 09/25/15 0626  GLUCAP 125* 114* 115*      Code Status : Full  Family Communication  : son  Disposition Plan  : Stay inpatient  Consults  :  Neurology  Procedures  :   CT head, MRI brain and C-spine consistent with Devic's disease.  Lumbar  puncture not consistent with meningitis (D/W - Dr Leonel Ramsay)  Right IJ venous dialysis catheter placed by IR on 09/23/2015    DVT Prophylaxis  :  Lovenox  Lab Results  Component Value Date   PLT 145* 09/25/2015    Inpatient Medications  Scheduled Meds: .  stroke: mapping our early stages of recovery book   Does not apply Once  . azaTHIOprine  50 mg Oral Daily  . enoxaparin (LOVENOX) injection  40 mg Subcutaneous Q0600  . insulin aspart  0-15 Units Subcutaneous TID WC  . insulin aspart  0-5 Units Subcutaneous QHS  . methylPREDNISolone (SOLU-MEDROL) injection  500 mg Intravenous BID  . metoprolol tartrate  25 mg Oral BID  . multivitamin with minerals  1 tablet Oral Daily  . pantoprazole  40 mg Oral Daily  . pregabalin  75 mg Oral TID  . sodium chloride  3 mL Intravenous Q12H  . sodium chloride  3 mL Intravenous Q12H   Continuous Infusions:   PRN Meds:.sodium chloride, LORazepam, ondansetron **OR** ondansetron (ZOFRAN) IV, oxyCODONE, senna-docusate, sodium chloride  Antibiotics  :    Anti-infectives    Start     Dose/Rate Route Frequency Ordered Stop   09/23/15 0815  ceFAZolin (ANCEF) IVPB 2 g/50 mL premix     2 g 100 mL/hr over 30 Minutes Intravenous  Once 09/23/15 0814 09/23/15 [id number]  Objective:   Filed Vitals:   09/24/15 2047 09/25/15 0207 09/25/15 0623 09/25/15 0919  BP: 121/76 118/71 119/68 123/62  Pulse: 84 79 90 85  Temp: 98.6 F (37 C) 98 F (36.7 C) 98.4 F (36.9 C) 98.3 F (36.8 C)  TempSrc: Oral Oral Oral Oral  Resp: 20 20 20 20   Height:      Weight:      SpO2: 100% 96% 97% 99%    Wt Readings from Last 3 Encounters:  09/23/15 78.835 kg (173 lb 12.8 oz)  02/08/14 73.199 kg (161 lb 6 oz)  01/29/14 75.297 kg (166 lb)     Intake/Output Summary (Last 24 hours) at 09/25/15 1013 Last data filed at 09/24/15 1700  Gross per 24 hour  Intake    720 ml  Output      0 ml  Net    720 ml     Physical Exam  Awake Alert, Oriented X 3, No  new F.N deficits, left arm strength 1/5,  L leg 4/5, legally blind, Normal affect [place].AT,PERRAL Supple Neck,No JVD, No cervical lymphadenopathy appriciated. Right IJ dialysis catheter in place. Symmetrical Chest wall movement, Good air movement bilaterally, CTAB RRR,No Gallops,Rubs or new Murmurs, No Parasternal Heave +ve B.Sounds, Abd Soft, No tenderness, No organomegaly appriciated, No rebound - guarding or rigidity. No Cyanosis, Clubbing or edema, No new Rash or bruise       Data Review:   Micro Results Recent Results (from the past 240 hour(s))  CSF culture     Status: None (Preliminary result)   Collection Time: 09/22/15  8:57 AM  Result Value Ref Range Status   Specimen Description CSF TUBE 2  Final   Special Requests NONE  Final   Gram Stain   Final    WBC PRESENT,BOTH PMN AND MONONUCLEAR NO ORGANISMS SEEN CYTOSPIN SMEAR    Culture NO GROWTH 2 DAYS  Final   Report Status PENDING  Incomplete    Radiology Reports Dg Chest 2 View  09/22/2015  CLINICAL DATA:  TIA. EXAM: CHEST  2 VIEW COMPARISON:  04/29/2012 FINDINGS: Lung volumes are low. Cardiomegaly is unchanged allowing for differences in technique. Minimal bibasilar subsegmental atelectasis no pulmonary edema, consolidation, pleural effusion or pneumothorax. No acute osseous abnormalities are seen. IMPRESSION: Low lung volumes with stable cardiomegaly. Electronically Signed   By: Jeb Levering M.D.   On: 09/22/2015 03:36   Ct Head Wo Contrast  09/21/2015  CLINICAL DATA:  Acute left-sided weakness. EXAM: CT HEAD WITHOUT CONTRAST TECHNIQUE: Contiguous axial images were obtained from the base of the skull through the vertex without intravenous contrast. COMPARISON:  CT scan of June 24, 2014. FINDINGS: Bony calvarium appears intact. No mass effect or midline shift is noted. Ventricular size is within normal limits. There is no evidence of mass lesion, hemorrhage or acute infarction. IMPRESSION: Normal head CT. Electronically  Signed   By: Marijo Conception, M.D.   On: 09/21/2015 16:27   Mr Brain Wo Contrast  09/21/2015  CLINICAL DATA:  Left arm numbness and pain. Neck pain. Stroke versus cervical spine disease. EXAM: MRI HEAD WITHOUT CONTRAST MRI CERVICAL SPINE WITHOUT CONTRAST TECHNIQUE: Multiplanar, multiecho pulse sequences of the brain and surrounding structures, and cervical spine, to include the craniocervical junction and cervicothoracic junction, were obtained without intravenous contrast. COMPARISON:  Head CT earlier same day.  MRI 06/24/2014 FINDINGS: MRI HEAD FINDINGS There is a 1 cm region of T2 and FLAIR abnormality within the left thalamus. No restricted diffusion presently.  On the old MRI, there appear to be an old small vessel insult in this location. Elsewhere, the cerebral hemispheres show a few punctate foci of T2 and FLAIR signal in the deep white matter. No cortical or large vessel territory insult. No mass lesion, hemorrhage, hydrocephalus or extra-axial collection. No pituitary mass. No inflammatory sinus disease. No skull or skullbase lesion. MRI CERVICAL SPINE FINDINGS The cervical spinal cord is diffusely abnormal showing enlargement and inter medullary T2 signal. This extends from the obex all the way to the C7 level. This is consistent with acute deep mild is a shin or myelitis. There are no degenerative changes. There is no compressive pathology. IMPRESSION: 1 cm region of abnormal T2 and FLAIR signal within the left thalamus not consistent with acute infarction. This could be an acute focus of demyelination or conceivably but less likely a late subacute infarction. Diffusely abnormal cervical spinal cord which is swollen and shows diffuse abnormal T2 signal. This is likely to represent acute demyelination/neuromyelitis optica (Devic's disease) or acute transverse myelitis. Quite likely that the findings of the left thalamus in the spinal cord are due to the same pathology. Electronically Signed   By: Nelson Chimes M.D.   On: 09/21/2015 20:41   Mr Jeri Cos Contrast  09/22/2015  CLINICAL DATA:  50 yo female with his history of bilateral optic neuritis now with longitudinally extensive transverse myelitis. Weakness of arms and legs. EXAM: MRI CERVICAL SPINE WITH CONTRAST; MRI HEAD WITH CONTRAST TECHNIQUE: Multiplanar and multiecho pulse sequences of the cervical spine, to include the craniocervical junction and cervicothoracic junction, were obtained according to standard protocol with intravenous contrast.; Multiplanar, multiecho pulse sequences of the brain and surrounding structures were obtained according to standard protocol with intravenous contrast CONTRAST:  7mL MULTIHANCE GADOBENATE DIMEGLUMINE 529 MG/ML IV SOLN COMPARISON:  Noncontrast brain and cervical spine from 07/21/2016. FINDINGS: Post infusion images of the brain demonstrates no abnormal enhancement. LEFT thalamic lesion, T2 and FLAIR hyperintense, probably represents an atypical area of demyelination, but non acute, given the lack of restricted diffusion and absence of blood brain barrier breakdown. In the cervical spine, there is extensive postcontrast enhancement, corresponding to the T2 and FLAIR hyperintensity on yesterday's exam, extending over multiple cervical segments, from the foramen magnum through C7. This enhancement involves both gray and white matter. There is greater involvement of the LEFT hemicord. In the setting of a patient with history of optic neuritis, the findings are consistent with Devic's disease. No features specific for optic neuritis can be observed on post infusion imaging through the brain, non dedicated orbit study. IMPRESSION: No abnormal intracranial enhancement is documented. Heterogeneous enhancement of the enlarged cervical cord segments, from foramen magnum through C7, involving both gray and white matter, consistent with Devic's disease. Electronically Signed   By: Staci Righter M.D.   On: 09/22/2015 12:39   Mr  Cervical Spine Wo Contrast  09/21/2015  CLINICAL DATA:  Left arm numbness and pain. Neck pain. Stroke versus cervical spine disease. EXAM: MRI HEAD WITHOUT CONTRAST MRI CERVICAL SPINE WITHOUT CONTRAST TECHNIQUE: Multiplanar, multiecho pulse sequences of the brain and surrounding structures, and cervical spine, to include the craniocervical junction and cervicothoracic junction, were obtained without intravenous contrast. COMPARISON:  Head CT earlier same day.  MRI 06/24/2014 FINDINGS: MRI HEAD FINDINGS There is a 1 cm region of T2 and FLAIR abnormality within the left thalamus. No restricted diffusion presently. On the old MRI, there appear to be an old small vessel insult in this  location. Elsewhere, the cerebral hemispheres show a few punctate foci of T2 and FLAIR signal in the deep white matter. No cortical or large vessel territory insult. No mass lesion, hemorrhage, hydrocephalus or extra-axial collection. No pituitary mass. No inflammatory sinus disease. No skull or skullbase lesion. MRI CERVICAL SPINE FINDINGS The cervical spinal cord is diffusely abnormal showing enlargement and inter medullary T2 signal. This extends from the obex all the way to the C7 level. This is consistent with acute deep mild is a shin or myelitis. There are no degenerative changes. There is no compressive pathology. IMPRESSION: 1 cm region of abnormal T2 and FLAIR signal within the left thalamus not consistent with acute infarction. This could be an acute focus of demyelination or conceivably but less likely a late subacute infarction. Diffusely abnormal cervical spinal cord which is swollen and shows diffuse abnormal T2 signal. This is likely to represent acute demyelination/neuromyelitis optica (Devic's disease) or acute transverse myelitis. Quite likely that the findings of the left thalamus in the spinal cord are due to the same pathology. Electronically Signed   By: Nelson Chimes M.D.   On: 09/21/2015 20:41   Mr Cervical Spine  W Contrast  09/22/2015  CLINICAL DATA:  50 yo female with his history of bilateral optic neuritis now with longitudinally extensive transverse myelitis. Weakness of arms and legs. EXAM: MRI CERVICAL SPINE WITH CONTRAST; MRI HEAD WITH CONTRAST TECHNIQUE: Multiplanar and multiecho pulse sequences of the cervical spine, to include the craniocervical junction and cervicothoracic junction, were obtained according to standard protocol with intravenous contrast.; Multiplanar, multiecho pulse sequences of the brain and surrounding structures were obtained according to standard protocol with intravenous contrast CONTRAST:  31mL MULTIHANCE GADOBENATE DIMEGLUMINE 529 MG/ML IV SOLN COMPARISON:  Noncontrast brain and cervical spine from 07/21/2016. FINDINGS: Post infusion images of the brain demonstrates no abnormal enhancement. LEFT thalamic lesion, T2 and FLAIR hyperintense, probably represents an atypical area of demyelination, but non acute, given the lack of restricted diffusion and absence of blood brain barrier breakdown. In the cervical spine, there is extensive postcontrast enhancement, corresponding to the T2 and FLAIR hyperintensity on yesterday's exam, extending over multiple cervical segments, from the foramen magnum through C7. This enhancement involves both gray and white matter. There is greater involvement of the LEFT hemicord. In the setting of a patient with history of optic neuritis, the findings are consistent with Devic's disease. No features specific for optic neuritis can be observed on post infusion imaging through the brain, non dedicated orbit study. IMPRESSION: No abnormal intracranial enhancement is documented. Heterogeneous enhancement of the enlarged cervical cord segments, from foramen magnum through C7, involving both gray and white matter, consistent with Devic's disease. Electronically Signed   By: Staci Righter M.D.   On: 09/22/2015 12:39   Ir Fluoro Guide Cv Line Right  09/23/2015   CLINICAL DATA:  Transverse myelitis.  Plasmapheresis. EXAM: TUNNELED DIALYSIS CATHETER PLACEMENT, ULTRASOUND GUIDANCE FOR VASCULAR ACCESS FLUOROSCOPY TIME:  24 seconds MEDICATIONS AND MEDICAL HISTORY: Versed 1.5 mg, Fentanyl 75 mcg. Additional Medications: As antibiotic prophylaxis, Ancef was ordered pre-procedure and administered intravenously within one hour of incision. ANESTHESIA/SEDATION: Moderate sedation time: 20 minutes CONTRAST:  None PROCEDURE: The procedure, risks, benefits, and alternatives were explained to the patient. Questions regarding the procedure were encouraged and answered. The patient understands and consents to the procedure. The right neck was prepped with ChloraPrep in a sterile fashion, and a sterile drape was applied covering the operative field. A sterile gown and sterile gloves  were used for the procedure. 1% lidocaine into the skin and subcutaneous tissue. The right jugular vein was noted to be patent initially with ultrasound. Under sonographic guidance, a micropuncture needle was inserted into the right IJ vein (Ultrasound and fluoroscopic image documentation was performed). It was removed over an 018 wire which was upsized to an Amplatz. This was advanced into the IVC. A small incision was made in the right upper chest. The tunneling device was utilized to advance the 23 centimeter tip to cuff catheter from the chest incision and out the neck incision. A peel-away sheath was advanced over the Amplatz wire. The leading edge of the catheter was then advanced through the peel-away sheath. The peel-away sheath was removed. It was flushed and instilled with heparin. The chest incision was closed with a 0 Prolene pursestring stitch. The neck incision was closed with a 4-0 Vicryl subcuticular stitch. COMPLICATIONS: None FINDINGS: The image demonstrates placement of a tunneled dialysis catheter with its tip in the right atrium. IMPRESSION: Successful right IJ vein tunneled dialysis catheter  with its tip in the right atrium. Electronically Signed   By: Marybelle Killings M.D.   On: 09/23/2015 12:33   Ir US Guide Vasc Access Right  09/23/2015  CLINICAL DATA:  Transverse myelitis.  Plasmapheresis. EXAM: TUNNELED DIALYSIS CATHETER PLACEMENT, ULTRASOUND GUIDANCE FOR VASCULAR ACCESS FLUOROSCOPY TIME:  24 seconds MEDICATIONS AND MEDICAL HISTORY: Versed 1.5 mg, Fentanyl 75 mcg. Additional Medications: As antibiotic prophylaxis, Ancef was ordered pre-procedure and administered intravenously within one hour of incision. ANESTHESIA/SEDATION: Moderate sedation time: 20 minutes CONTRAST:  None PROCEDURE: The procedure, risks, benefits, and alternatives were explained to the patient. Questions regarding the procedure were encouraged and answered. The patient understands and consents to the procedure. The right neck was prepped with ChloraPrep in a sterile fashion, and a sterile drape was applied covering the operative field. A sterile gown and sterile gloves were used for the procedure. 1% lidocaine into the skin and subcutaneous tissue. The right jugular vein was noted to be patent initially with ultrasound. Under sonographic guidance, a micropuncture needle was inserted into the right IJ vein (Ultrasound and fluoroscopic image documentation was performed). It was removed over an 018 wire which was upsized to an Amplatz. This was advanced into the IVC. A small incision was made in the right upper chest. The tunneling device was utilized to advance the 23 centimeter tip to cuff catheter from the chest incision and out the neck incision. A peel-away sheath was advanced over the Amplatz wire. The leading edge of the catheter was then advanced through the peel-away sheath. The peel-away sheath was removed. It was flushed and instilled with heparin. The chest incision was closed with a 0 Prolene pursestring stitch. The neck incision was closed with a 4-0 Vicryl subcuticular stitch. COMPLICATIONS: None FINDINGS: The image  demonstrates placement of a tunneled dialysis catheter with its tip in the right atrium. IMPRESSION: Successful right IJ vein tunneled dialysis catheter with its tip in the right atrium. Electronically Signed   By: Marybelle Killings M.D.   On: 09/23/2015 12:33   Dg Fluoro Guide Lumbar Puncture  09/22/2015  CLINICAL DATA:  Transverse myelitis. EXAM: DIAGNOSTIC LUMBAR PUNCTURE UNDER FLUOROSCOPIC GUIDANCE FLUOROSCOPY TIME:  Fluoroscopy Time (in minutes and seconds): 0 minutes, 37 seconds Number of Acquired Images:  0 PROCEDURE: I discussed the risks (including hemorrhage, infection, headache, and nerve damage, among others), benefits, and alternatives to fluoroscopically guided lumbar puncture with the patient. We specifically discussed the high technical  likelihood of success of the procedure. The patient understood and elected to undergo the procedure. Standard time-out was employed. Following sterile skin prep and local anesthetic administration consisting of 1 percent lidocaine, a 22 gauge spinal needle was advanced without difficulty into the thecal sac at the at the L4-5 level. Clear CSF was returned. Opening pressure was 17 cm of water. 13 cc of clear CSF was collected. The needle was subsequently removed and the skin cleansed and bandaged. No immediate complications were observed. IMPRESSION: 1. Technically successful fluoroscopically guided lumbar puncture at the L4-5 level. No complicating feature observed. Opening pressure 17 cm of water. Electronically Signed   By: Van Clines M.D.   On: 09/22/2015 09:15     CBC  Recent Labs Lab 09/21/15 1422 09/21/15 1600 09/22/15 0351 09/23/15 0433 09/24/15 0415 09/25/15 0531  WBC 3.2*  --  4.8 6.1 14.7* 14.2*  HGB 13.7 16.3* 11.7* 12.1 11.9* 11.8*  HCT 42.1 48.0* 36.5 38.0 37.6 36.8  PLT 251  --  236 187 203 145*  MCV 72.1*  --  71.3* 72.5* 73.0* 72.4*  MCH 23.5*  --  22.9* 23.1* 23.1* 23.2*  MCHC 32.5  --  32.1 31.8 31.6 32.1  RDW 14.3  --  14.0  14.4 14.8 14.9  LYMPHSABS 0.8  --  0.3* 0.4* 0.6* 0.7  MONOABS 0.4  --  0.2 0.1 0.3 0.3  EOSABS 0.0  --  0.0 0.0 0.0 0.0  BASOSABS 0.0  --  0.0 0.0 0.0 0.0    Chemistries   Recent Labs Lab 09/21/15 1422 09/21/15 1600 09/22/15 0350 09/22/15 0351 09/23/15 0433 09/24/15 0415  NA 135 137  --  137 139 141  K 4.7 5.4*  --  3.7 5.0 4.7  CL 104 106  --  107 110 117*  CO2 19*  --   --  20* 21* 19*  GLUCOSE 91 88  --  135* 124* 132*  BUN 12 18  --  9 9 13   CREATININE 0.80 0.80 0.80 0.77 0.69 0.69  CALCIUM 10.1  --   --  9.2 9.6 8.8*  MG  --   --   --  1.8  --   --   AST 63*  --   --  45* 50* 21  ALT 48  --   --  40 48 18  ALKPHOS 54  --   --  50 54 22*  BILITOT 1.2  --   --  1.8* 0.8 0.5   ------------------------------------------------------------------------------------------------------------------ No results for input(s): CHOL, HDL, LDLCALC, TRIG, CHOLHDL, LDLDIRECT in the last 72 hours.  Lab Results  Component Value Date   HGBA1C 6.0* 09/22/2015   ------------------------------------------------------------------------------------------------------------------  Recent Labs  09/22/15 1515  TSH 0.680   ------------------------------------------------------------------------------------------------------------------ No results for input(s): VITAMINB12, FOLATE, FERRITIN, TIBC, IRON, RETICCTPCT in the last 72 hours.  Coagulation profile  Recent Labs Lab 09/23/15 0433  INR 1.07    No results for input(s): DDIMER in the last 72 hours.  Cardiac Enzymes No results for input(s): CKMB, TROPONINI, MYOGLOBIN in the last 168 hours.  Invalid input(s): CK ------------------------------------------------------------------------------------------------------------------ No results found for: BNP  Time Spent in minutes   35   Lala Lund K M.D on 09/25/2015 at 10:13 AM  Between 7am to 7pm - Pager - [phone]  After 7pm go to [email or web address] - password [facility]  [facility] -  Office  [phone]

## 2015-09-25 NOTE — Progress Notes (Signed)
Subjective: No change  Exam: Filed Vitals:   09/25/15 0623 09/25/15 0919  BP: 119/68 123/62  Pulse: 90 85  Temp: 98.4 F (36.9 C) 98.3 F (36.8 C)  Resp: 20 20   Gen: In bed, NAD Resp: non-labored breathing, no acute distress Abd: soft, nt  Neuro: MS: awake, alert, cooperative [id number], pupils are reactive.  Motor: 5/5 on right, 2/5 LUE, 3/5 LLE proximally, 4/5 distally Sensory:decreased on the left, also with some paresthesia on the right as well.   Pertinent Labs: cmp - unremarkable No leukocytosis   Impression: 50 yo female with history of bilateral optic neuritis now with longitudinally extensive transverse myelitis. 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 She does have a history of some joint pain, that is the only other symptom of lupus which she has had.  With devic's disease, there is a trend towards improvement with starting plasma exchange concomittently with steroids as opposing to wait for nonresponse to steroids. Given that clinically this fits, I would favor pursuing this. If it is due to non-aquaporin 4 antibody related lupus, then it is likely to be antibody mediated as well and therefore plasma exchange is still be an appropriate therapy.  I do wonder if her primary diagnosis of lupus was based primarily on optic neuritis, if azothiaprine is still indicated, but will need to have her see her rheumatologist to evaluate this.   Recommendations: 1) Solu-Medrol 1 g total daily for 5 days, today is day #4 2) concomitant plasma exchange.  QOD for total 5 treatments, had 2 so far, next will be done tomorrow.  3) neurology will continue to follow  Roland Rack, MD [facility] [phone]  If 7pm- 7am, please page neurology on call as listed in [facility].

## 2015-09-26 DIAGNOSIS — D72829 Elevated white blood cell count, unspecified: Secondary | ICD-10-CM | POA: Insufficient documentation

## 2015-09-26 DIAGNOSIS — M329 Systemic lupus erythematosus, unspecified: Secondary | ICD-10-CM

## 2015-09-26 DIAGNOSIS — F329 Major depressive disorder, single episode, unspecified: Secondary | ICD-10-CM

## 2015-09-26 DIAGNOSIS — D696 Thrombocytopenia, unspecified: Secondary | ICD-10-CM | POA: Insufficient documentation

## 2015-09-26 DIAGNOSIS — G360 Neuromyelitis optica [Devic]: Secondary | ICD-10-CM

## 2015-09-26 DIAGNOSIS — Z8673 Personal history of transient ischemic attack (TIA), and cerebral infarction without residual deficits: Secondary | ICD-10-CM | POA: Insufficient documentation

## 2015-09-26 LAB — POCT I-STAT, CHEM 8
BUN: 12 mg/dL (ref 6–20)
BUN: 17 mg/dL (ref 6–20)
BUN: 19 mg/dL (ref 6–20)
CALCIUM ION: 1.31 mmol/L — AB (ref 1.12–1.23)
CALCIUM ION: 1.36 mmol/L — AB (ref 1.12–1.23)
CHLORIDE: 106 mmol/L (ref 101–111)
CREATININE: 0.6 mg/dL (ref 0.44–1.00)
CREATININE: 0.7 mg/dL (ref 0.44–1.00)
Calcium, Ion: 1.33 mmol/L — ABNORMAL HIGH (ref 1.12–1.23)
Chloride: 109 mmol/L (ref 101–111)
Chloride: 110 mmol/L (ref 101–111)
Creatinine, Ser: 0.6 mg/dL (ref 0.44–1.00)
GLUCOSE: 126 mg/dL — AB (ref 65–99)
GLUCOSE: 160 mg/dL — AB (ref 65–99)
Glucose, Bld: 202 mg/dL — ABNORMAL HIGH (ref 65–99)
HCT: 38 % (ref 36.0–46.0)
HCT: 37 % (ref 36.0–46.0)
HEMATOCRIT: 38 % (ref 36.0–46.0)
HEMOGLOBIN: 12.9 g/dL (ref 12.0–15.0)
HEMOGLOBIN: 12.9 g/dL (ref 12.0–15.0)
Hemoglobin: 12.6 g/dL (ref 12.0–15.0)
Potassium: 4.3 mmol/L (ref 3.5–5.1)
Potassium: 4.5 mmol/L (ref 3.5–5.1)
Potassium: 4 mmol/L (ref 3.5–5.1)
SODIUM: 140 mmol/L (ref 135–145)
SODIUM: 142 mmol/L (ref 135–145)
Sodium: 143 mmol/L (ref 135–145)
TCO2: 20 mmol/L (ref 0–100)
TCO2: 21 mmol/L (ref 0–100)
TCO2: 23 mmol/L (ref 0–100)

## 2015-09-26 LAB — CBC WITH DIFFERENTIAL/PLATELET
BASOS ABS: 0 10*3/uL (ref 0.0–0.1)
BASOS PCT: 0 %
Eosinophils Absolute: 0 10*3/uL (ref 0.0–0.7)
Eosinophils Relative: 0 %
HEMATOCRIT: 36.4 % (ref 36.0–46.0)
HEMOGLOBIN: 11.8 g/dL — AB (ref 12.0–15.0)
Lymphocytes Relative: 6 %
Lymphs Abs: 0.7 10*3/uL (ref 0.7–4.0)
MCH: 23.3 pg — ABNORMAL LOW (ref 26.0–34.0)
MCHC: 32.4 g/dL (ref 30.0–36.0)
MCV: 71.9 fL — ABNORMAL LOW (ref 78.0–100.0)
Monocytes Absolute: 0.2 10*3/uL (ref 0.1–1.0)
Monocytes Relative: 2 %
NEUTROS ABS: 11.7 10*3/uL — AB (ref 1.7–7.7)
NEUTROS PCT: 93 %
Platelets: 146 10*3/uL — ABNORMAL LOW (ref 150–400)
RBC: 5.06 MIL/uL (ref 3.87–5.11)
RDW: 14.8 % (ref 11.5–15.5)
WBC: 12.5 10*3/uL — ABNORMAL HIGH (ref 4.0–10.5)

## 2015-09-26 LAB — GLUCOSE, CAPILLARY
GLUCOSE-CAPILLARY: 221 mg/dL — AB (ref 65–99)
GLUCOSE-CAPILLARY: 98 mg/dL (ref 65–99)
GLUCOSE-CAPILLARY: 133 mg/dL — AB (ref 65–99)
Glucose-Capillary: 111 mg/dL — ABNORMAL HIGH (ref 65–99)

## 2015-09-26 LAB — TSH: TSH: 0.274 u[IU]/mL — AB (ref 0.350–4.500)

## 2015-09-26 LAB — PROTIME-INR
INR: 1.36 (ref 0.00–1.49)
Prothrombin Time: 16.9 seconds — ABNORMAL HIGH (ref 11.6–15.2)

## 2015-09-26 MED ORDER — ACD FORMULA A 0.73-2.45-2.2 GM/100ML VI SOLN
500.0000 mL | Status: DC
  Administered 2015-09-26: 500 mL via INTRAVENOUS
  Filled 2015-09-26: qty 500

## 2015-09-26 MED ORDER — ACD FORMULA A 0.73-2.45-2.2 GM/100ML VI SOLN
Status: AC
Start: 2015-09-26 — End: 2015-09-26
  Administered 2015-09-26: 500 mL via INTRAVENOUS
  Filled 2015-09-26: qty 500

## 2015-09-26 MED ORDER — ACETAMINOPHEN 325 MG PO TABS: 650.0000 mg | ORAL_TABLET | ORAL | Status: DC | PRN

## 2015-09-26 MED ORDER — HEPARIN SODIUM (PORCINE) 1000 UNIT/ML IJ SOLN
1000.0000 [IU] | Freq: Once | INTRAMUSCULAR | Status: DC
  Filled 2015-09-26: qty 1

## 2015-09-26 MED ORDER — CALCIUM CARBONATE ANTACID 500 MG PO CHEW
2.0000 | CHEWABLE_TABLET | ORAL | Status: AC
  Administered 2015-09-26: 400 mg via ORAL
  Filled 2015-09-26: qty 2

## 2015-09-26 MED ORDER — DIPHENHYDRAMINE HCL 25 MG PO CAPS: 25.0000 mg | ORAL_CAPSULE | Freq: Four times a day (QID) | ORAL | Status: DC | PRN

## 2015-09-26 MED ORDER — SODIUM CHLORIDE 0.9 % IV SOLN
2.0000 g | Freq: Once | INTRAVENOUS | Status: DC
  Filled 2015-09-26: qty 20

## 2015-09-26 MED ORDER — SODIUM CHLORIDE 0.9 % IV SOLN
INTRAVENOUS | Status: AC
  Administered 2015-09-26 (×2): via INTRAVENOUS_CENTRAL
  Filled 2015-09-26 (×2): qty 200

## 2015-09-26 NOTE — Consult Note (Signed)
Physical Medicine and Rehabilitation Consult Reason for Consult: Devic disease Referring Physician: [facility]   HPI: Andrea Berry is a 50 y.o. right handed female with history of CVA 2008, legally blind/bilateral optic neuritis consistent with Devic disease, SLE maintained on chronic prednisone along with Imuran, hypertension. Patient lives with her son. One level home 3 steps to entry. Presented 09/21/2015 with 2 day history of left-sided neck pain and left upper lower extremity weakness. MRI of the brain and C-spine completed results showing left thalamic T2 signal change concerning for acute demyelinization and multilevel cervical cord swelling at T2 concern for acute demyelination or transverse myelitis. Lumbar puncture completed not consistent with meningitis. Placed on intravenous Solu-Medrol as well as plasma exchange with 2 of 5 treatments completed. Subcutaneous Lovenox for DVT prophylaxis. Tolerating a regular diet. Physical therapy evaluation completed 09/23/2015 with recommendations of physical medicine rehabilitation consult.  Review of Systems  Constitutional: Negative for fever and chills.       Left side weakness  HENT: Negative for hearing loss.   Eyes:       Legally blind  Respiratory: Negative for cough.        Shortness of breath with exertion  Cardiovascular: Negative for chest pain and palpitations.  Gastrointestinal: Positive for constipation. Negative for nausea.       GERD  Genitourinary: Negative for dysuria and hematuria.  Musculoskeletal: Positive for myalgias and joint pain.  Neurological: Positive for dizziness and headaches. Negative for seizures and loss of consciousness.  Psychiatric/Behavioral: Positive for depression.  All other systems reviewed and are negative.  Past Medical History  Diagnosis Date  . Hypertension   . Blind   . Lupus ([place])   . Kidney stones   . Shortness of breath     due to medication  . Depression   . GERD  (gastroesophageal reflux disease)   . Headache(784.0)   . Arthritis   . Stroke [facility]) 2008    visually impaired   Past Surgical History  Procedure Laterality Date  . Tubal ligation    . Tubal ligation    . Breast biopsy     Family History  Problem Relation Age of Onset  . Cancer Mother   . Cancer Father    Social History:  reports that she has never smoked. She does not have any smokeless tobacco history on file. She reports that she does not drink alcohol or use illicit drugs. Allergies: No Known Allergies Medications Prior to Admission  Medication Sig Dispense Refill  . azaTHIOprine (IMURAN) 50 MG tablet Take 50 mg by mouth daily.     Marland Kitchen ibandronate (BONIVA) 150 MG tablet Take 150 mg by mouth every 30 (thirty) days.     Marland Kitchen LORazepam (ATIVAN) 0.5 MG tablet Take 0.5 mg by mouth every 8 (eight) hours as needed for anxiety.     . Multiple Vitamin (MULTIVITAMIN) capsule Take 1 capsule by mouth daily.    Marland Kitchen omeprazole (PRILOSEC) 20 MG capsule Take 20 mg by mouth daily.     . predniSONE (DELTASONE) 5 MG tablet Take 5 mg by mouth daily with breakfast.    . pregabalin (LYRICA) 50 MG capsule Take 50 mg by mouth 3 (three) times daily.      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  Transfers Overall transfer level: Needs assistance Equipment used: 1 person hand held assist Transfers: Sit to/from Stand Sit to Stand: Mod assist General transfer comment: Assist on Lt side with hand and Lt LE stabilization. PT blocking Lt LE with weight shift to Lt. Patient maintaining weight primarily through [place]. Repeat sit/stand X2.       ADL: ADL Overall ADL's : Needs assistance/impaired Eating/Feeding: Set up Grooming: Minimal assistance, Sitting Upper Body Bathing: Moderate assistance, Bed level Lower Body Bathing: Maximal assistance, Bed level Upper Body Dressing : Moderate assistance Lower Body Dressing: Maximal assistance Functional mobility during ADLs: Moderate assistance General ADL Comments: Pt legally blind. Pt educated on location of call bell and using tactile cues for educating on operation of control Pt able to return demosntrate.   Cognition: Cognition Overall Cognitive Status: Within Functional Limits for tasks assessed Orientation Level: Oriented X4 Cognition Arousal/Alertness: Awake/alert Behavior During Therapy: Flat affect Overall Cognitive Status: Within Functional Limits for tasks assessed  Blood pressure 139/80, pulse 74, temperature 97.7 F (36.5 C), temperature source Oral, resp. rate 20, height 5\' 1"  (1.549 m), weight 78.835 kg (173 lb 12.8 oz), SpO2 95 %. Physical Exam  Vitals reviewed. Constitutional: She is oriented to person, place, and time. She appears well-developed and well-nourished.  HENT:  Head: Normocephalic and atraumatic.  Eyes: Conjunctivae are normal. Right eye exhibits no discharge. Left eye exhibits no discharge.  Patient can see images  Neck: Normal range of motion. Neck supple. No thyromegaly present.  Cardiovascular: Normal rate and regular rhythm.   Respiratory: Effort normal and breath sounds normal. No respiratory distress.   GI: Soft. Bowel sounds are normal. She exhibits no distension.  Musculoskeletal: She exhibits no edema or tenderness.  Neurological: She is alert and oriented to person, place, and time.  1+ DTRs LUE, brisk otherwise Sensation diminished to light touch bilateral upper extremities and left lower extremity Motor: LUE shoulder abduction 2/5, distally 0/5 LLE: 4 -/5 proximal distal RLE: 5/5 proximal distal RUE: 4+/5 proximal to distal  Skin: Skin is warm and dry.  Psychiatric: Her speech is normal and behavior is normal. Judgment and thought content normal. Cognition and memory are normal. She exhibits a depressed mood.   Results for orders placed or performed during the hospital encounter of 09/21/15 (from the past 24 hour(s))  Glucose, capillary     Status: Abnormal   Collection Time: 09/25/15  6:26 AM  Result Value Ref Range   Glucose-Capillary 115 (H) 65 - 99 mg/dL   Comment 1 Notify RN    Comment 2 Document in Chart   Glucose, capillary     Status: Abnormal   Collection Time: 09/25/15 12:16 PM  Result Value Ref Range   Glucose-Capillary 132 (H) 65 - 99 mg/dL  Glucose, capillary     Status: Abnormal   Collection Time: 09/25/15  4:40 PM  Result Value Ref Range   Glucose-Capillary 132 (H) 65 - 99 mg/dL  Glucose, capillary     Status: Abnormal   Collection Time: 09/25/15 10:52  PM  Result Value Ref Range   Glucose-Capillary 221 (H) 65 - 99 mg/dL   Comment 1 Notify RN    Comment 2 Document in Chart   CBC WITH DIFFERENTIAL     Status: Abnormal   Collection Time: 09/26/15  4:01 AM  Result Value Ref Range   WBC 12.5 (H) 4.0 - 10.5 K/uL   RBC 5.06 3.87 - 5.11 MIL/uL   Hemoglobin 11.8 (L) 12.0 - 15.0 g/dL   HCT 36.4 36.0 - 46.0 %   MCV 71.9 (L) 78.0 - 100.0 fL   MCH 23.3 (L) 26.0 - 34.0 pg   MCHC 32.4 30.0 - 36.0 g/dL   RDW 14.8 11.5 - 15.5 %   Platelets 146 (L) 150 - 400 K/uL   Neutrophils Relative % 93 %   Neutro Abs 11.7 (H) 1.7 - 7.7 K/uL   Lymphocytes Relative 6 %    Lymphs Abs 0.7 0.7 - 4.0 K/uL   Monocytes Relative 2 %   Monocytes Absolute 0.2 0.1 - 1.0 K/uL   Eosinophils Relative 0 %   Eosinophils Absolute 0.0 0.0 - 0.7 K/uL   Basophils Relative 0 %   Basophils Absolute 0.0 0.0 - 0.1 K/uL   No results found.  Assessment/Plan: Diagnosis: Devic disease Labs and images independently reviewed.  Records reviewed and summated above.  1. Does the need for close, 24 hr/day medical supervision in concert with the patient's rehab needs make it unreasonable for this patient to be served in a less intensive setting? Yes  Co-Morbidities requiring supervision/potential complications: CVA [id number], legally blind/bilateral optic neuritis consistent with Devic disease (cont meds), SLE (cont meds for now), leukocytosis (likely secondary to steroids), thrombocytopenia (<60,000/mm3 no resistive exercise) 2. Due to bowel management, safety, disease management, medication administration and patient education, does the patient require 24 hr/day rehab nursing? Yes 3. Does the patient require coordinated care of a physician, rehab nurse, PT (1-2 hrs/day, 5 days/week) and OT (1-2 hrs/day, 5 days/week) to address physical and functional deficits in the context of the above medical diagnosis(es)? Yes Addressing deficits in the following areas: balance, endurance, locomotion, strength, transferring, bathing, dressing, grooming, toileting and psychosocial support 4. Can the patient actively participate in an intensive therapy program of at least 3 hrs of therapy per day at least 5 days per week? Yes 5. The potential for patient to make measurable gains while on inpatient rehab is excellent 6. Anticipated functional outcomes upon discharge from inpatient rehab are modified independent and supervision  with PT, modified independent and supervision with OT, n/a with SLP. 7. Estimated rehab length of stay to reach the above functional goals is: 16-19 days. 8. Does the patient have adequate  social supports and living environment to accommodate these discharge functional goals? Potentially 9. Anticipated D/C setting: Home 10. Anticipated post D/C treatments: HH therapy and Home excercise program 11. Overall Rehab/Functional Prognosis: good  RECOMMENDATIONS: This patient's condition is appropriate for continued rehabilitative care in the following setting: Pt is unsure if she will have support on discharge with her son currently unemployed, but planning on returning to work.  Will follow up with likely likely CIR admission. Patient has agreed to participate in recommended program. Yes Note that insurance prior authorization may be required for reimbursement for recommended care.  Comment: Rehab Admissions Coordinator to follow up.  Delice Lesch, MD 09/26/2015

## 2015-09-26 NOTE — Progress Notes (Signed)
Pt returned from treatment with no noted distress. Pt denies pain or discomfort. Dressing dated, clean dry and intact. VSS. Will continue to monitor.

## 2015-09-26 NOTE — Progress Notes (Signed)
Occupational Therapy Treatment Patient Details Name: Andrea Berry MRN: [id number] DOB: Feb 06, 1966 Today's Date: 09/26/2015    History of present illness 50 yo female initially presenting with Lt UE weakness and numbness. Pt has a history of bilateral optic neuritislitis, consistent with Devic disease. PMH: hypertension, blind, lupus, depression, CVA 2008.   OT comments  OT session limited by pt's fatigue and pt was unwilling to mobilize or complete bed level ADLs today due to having just completed stand-pivot transfer to [facility] with RN. Discussed technique for transfer and concerns pt had about it. Assisted pt with AAROM and PROM on hand, wrist, and elbow - pt was unable to tolerate shoulder AAROM due to pain. Repositioned pt's LUE on pillows. Will continue to follow acutely to address OT needs and goals.   Follow Up Recommendations  CIR;Supervision/Assistance - 24 hour    Equipment Recommendations  3 in 1 bedside comode    Recommendations for Other Services      Precautions / Restrictions Precautions Precautions: Fall Restrictions Weight Bearing Restrictions: No       Mobility Bed Mobility Overal bed mobility: Needs Assistance Bed Mobility: Rolling Rolling: Min assist         General bed mobility comments: HOB elevated, use of bedrails to roll side-side in bed for repositioning  Transfers                 General transfer comment: Unwilling to mobilize with OT today after just having completed stand-pivot transfer to [facility] with RN/NT.     Balance                                   ADL Overall ADL's : Needs assistance/impaired                                       General ADL Comments: Pt just completed stand-pivot transfer from bed to [facility] with RN/NT and was unwilling to mobilize or complete bed level ADLs with OT. Completed AAROM and PROM with LUE and repositioned.      Vision                 Additional Comments: Legally  blind - can see minimally out of L eye   Perception     Praxis      Cognition   Behavior During Therapy: Flat affect Overall Cognitive Status: Within Functional Limits for tasks assessed                       Extremity/Trunk Assessment               Exercises General Exercises - Upper Extremity Elbow Flexion: AAROM;Self ROM;Left;5 reps;Supine Elbow Extension: AAROM;Self ROM;Left;5 reps;Supine Wrist Flexion: AAROM;Self ROM;Left;5 reps;Supine Wrist Extension: AAROM;Self ROM;Left;5 reps;Supine Digit Composite Flexion: AAROM;Self ROM;Left;5 reps;Supine Composite Extension: AAROM;Self ROM;Left;5 reps;Supine   Shoulder Instructions       General Comments      Pertinent Vitals/ Pain       Pain Assessment: Faces Faces Pain Scale: Hurts even more Pain Location: LUE during AAROM Pain Descriptors / Indicators: Numbness;Sore Pain Intervention(s): Limited activity within patient's tolerance;Monitored during session;Repositioned  Home Living  Prior Functioning/Environment              Frequency Min 2X/week     Progress Toward Goals  OT Goals(current goals can now be found in the care plan section)  Progress towards OT goals: Progressing toward goals  Acute Rehab OT Goals Patient Stated Goal: return to being independent OT Goal Formulation: With patient Time For Goal Achievement: 10/07/15 Potential to Achieve Goals: Good ADL Goals Pt Will Perform Grooming: with set-up;sitting Pt Will Perform Upper Body Bathing: with set-up;with supervision;sitting Pt Will Perform Lower Body Bathing: with min guard assist;sit to/from stand Pt Will Transfer to Toilet: with min assist;bedside commode;stand pivot transfer Pt Will Perform Toileting - Clothing Manipulation and hygiene: with min assist;sit to/from stand Additional ADL Goal #1: Use LUE as funcitonal assist for ADL  Plan Discharge plan remains appropriate     Co-evaluation                 End of Session     Activity Tolerance Patient limited by fatigue   Patient Left in bed;with call bell/phone within reach;with bed alarm set   Nurse Communication Mobility status;Other (comment) (Keep pillow under LUE at all times)        Time: [id number] OT Time Calculation (min): 17 min  Charges:    Redmond Baseman, OTR/L Pager: [phone] 09/26/2015, 4:20 PM

## 2015-09-26 NOTE — Progress Notes (Signed)
Patient Demographics:    Andrea Berry, is a 50 y.o. female, DOB - 1966/05/14, [id number]  Admit date - 09/21/2015   Admitting Physician Reubin Milan, MD  Outpatient Primary MD for the patient is [name], NP  LOS - 4   Chief Complaint  Patient presents with  . Extremity Weakness        Subjective:    Andrea Berry today has, No headache, No chest pain, No abdominal pain - No Nausea, No new weakness tingling or numbness, No Cough - SOB. +ve severe left arm weakness and mild L leg weakness.   Assessment  & Plan :     1. Severe left arm weakness ongoing for 3 days. Underlying history of legal blindness. Secondary to Devic's disease, neurology on board. Patient on Solu-Medrol  total of 5 doses stop date 09/26/2015 and due for plasma exchange every other day for a total 5 runs to be finished on 09/30/2015 as directed by neurology. Further management of this problem defer to neurology. Continue supportive care with PT OT. May require placement. Uncertain social work and CIR on 09/25/2015.  2. History of lupus. On prednisone at home, currently on Solu-Medrol along with Imuran. Outpatient follow-up with Dr. Estanislado Pandy postdischarge.  3. History of TIA/stroke in the past. No acute issues.  4. Prolonged QTC upon admission. IV magnesium, low-dose beta blocker, monitor on telemetry. We'll repeat EKG.  5. Dehydration with tachycardia. Hydrate, monitor. Check baseline TSH,  stable echogram.  6. GERD. On PPI.  7. Leuokocytosis due to steroids. Afebrile monitor.  8. Steroid induced hyperglycemia. I assess and monitor.  CBG (last 3)   Recent Labs  09/25/15 1640 09/25/15 2252 09/26/15 0649  GLUCAP 132* 221* 98      Code Status : Full  Family Communication  : son  Disposition Plan   : Stay inpatient  Consults  :  Neurology  Procedures  :   CT head, MRI brain and C-spine consistent with Devic's disease.  Lumbar puncture not consistent with meningitis (D/W - Dr Leonel Ramsay)  Right IJ venous dialysis catheter placed by IR on 09/23/2015  TTE   - Left ventricle: The cavity size was normal. Wall thickness wasincreased in a pattern of moderate to severe LVH. Systolicfunction was vigorous. The estimated ejection fraction was in the range of 65% to 70%. There was dynamic obstruction in the mid cavity, with a peak velocity of 314 cm/sec and a peak gradient of39 mm Hg. Wall motion was normal; there were no regional wall motion abnormalities. Doppler parameters are consistent with abnormal left ventricular relaxation (grade 1 diastolic dysfunction). - Left atrium: The atrium was mildly dilated. - Pericardium, extracardiac: A small pericardial effusion was identified circumferential to the heart.   DVT Prophylaxis  :  Lovenox  Lab Results  Component Value Date   PLT 146* 09/26/2015    Inpatient Medications  Scheduled Meds: .  stroke: mapping our early stages of recovery book   Does not apply Once  . therapeutic plasma exchange solution   Dialysis Q1 Hr x 2  . azaTHIOprine  50 mg Oral Daily  . calcium gluconate IVPB  2 g Intravenous Once  . enoxaparin (LOVENOX) injection  40 mg Subcutaneous Q0600  . insulin aspart  0-15 Units Subcutaneous TID WC  . insulin aspart  0-5 Units Subcutaneous QHS  . methylPREDNISolone (SOLU-MEDROL) injection  500 mg Intravenous BID  . metoprolol tartrate  25 mg Oral BID  . multivitamin with minerals  1 tablet Oral Daily  . pantoprazole  40 mg Oral Daily  . pregabalin  75 mg Oral TID  . sodium chloride  3 mL Intravenous Q12H  . sodium chloride  3 mL Intravenous Q12H   Continuous Infusions:   PRN Meds:.sodium chloride, LORazepam, ondansetron **OR** ondansetron (ZOFRAN) IV, oxyCODONE, senna-docusate, sodium chloride  Antibiotics  :     Anti-infectives    Start     Dose/Rate Route Frequency Ordered Stop   09/23/15 0815  ceFAZolin (ANCEF) IVPB 2 g/50 mL premix     2 g 100 mL/hr over 30 Minutes Intravenous  Once 09/23/15 0814 09/23/15 0922        Objective:   Filed Vitals:   09/25/15 2230 09/26/15 0207 09/26/15 0642 09/26/15 0900  BP: 125/92 139/80 142/75 127/65  Pulse: 90 74 87 110  Temp: 97.9 F (36.6 C) 97.7 F (36.5 C) 97.9 F (36.6 C) 98.2 F (36.8 C)  TempSrc: Oral Oral Oral Oral  Resp: 20 20 20 20   Height:      Weight:      SpO2: 96% 95% 98% 96%    Wt Readings from Last 3 Encounters:  09/23/15 78.835 kg (173 lb 12.8 oz)  02/08/14 73.199 kg (161 lb 6 oz)  01/29/14 75.297 kg (166 lb)     Intake/Output Summary (Last 24 hours) at 09/26/15 1002 Last data filed at 09/26/15 0400  Gross per 24 hour  Intake    330 ml  Output    250 ml  Net     80 ml     Physical Exam  Awake Alert, Oriented X 3, No new F.N deficits, left arm strength 1/5,  L leg 4/5, legally blind, Normal affect [place].AT,PERRAL Supple Neck,No JVD, No cervical lymphadenopathy appriciated. Right IJ dialysis catheter in place. Symmetrical Chest wall movement, Good air movement bilaterally, CTAB RRR,No Gallops,Rubs or new Murmurs, No Parasternal Heave +ve B.Sounds, Abd Soft, No tenderness, No organomegaly appriciated, No rebound - guarding or rigidity. No Cyanosis, Clubbing or edema, No new Rash or bruise       Data Review:   Micro Results Recent Results (from the past 240 hour(s))  CSF culture     Status: None   Collection Time: 09/22/15  8:57 AM  Result Value Ref Range Status   Specimen Description CSF TUBE 2  Final   Special Requests NONE  Final   Gram Stain   Final    WBC PRESENT,BOTH PMN AND MONONUCLEAR NO ORGANISMS SEEN CYTOSPIN SMEAR    Culture NO GROWTH 3 DAYS  Final   Report Status 09/25/2015 FINAL  Final    Radiology Reports Dg Chest 2 View  09/22/2015  CLINICAL DATA:  TIA. EXAM: CHEST  2 VIEW COMPARISON:   04/29/2012 FINDINGS: Lung volumes are low. Cardiomegaly is unchanged allowing for differences in technique. Minimal bibasilar subsegmental atelectasis no pulmonary edema, consolidation, pleural effusion or pneumothorax. No acute osseous abnormalities are seen. IMPRESSION: Low lung volumes with stable cardiomegaly. Electronically Signed   By: Jeb Levering M.D.   On: 09/22/2015 03:36   Ct Head Wo Contrast  09/21/2015  CLINICAL DATA:  Acute left-sided weakness. EXAM: CT HEAD WITHOUT CONTRAST TECHNIQUE: Contiguous axial images were obtained from the base of the skull through the vertex without intravenous  contrast. COMPARISON:  CT scan of June 24, 2014. FINDINGS: Bony calvarium appears intact. No mass effect or midline shift is noted. Ventricular size is within normal limits. There is no evidence of mass lesion, hemorrhage or acute infarction. IMPRESSION: Normal head CT. Electronically Signed   By: Marijo Conception, M.D.   On: 09/21/2015 16:27   Mr Brain Wo Contrast  09/21/2015  CLINICAL DATA:  Left arm numbness and pain. Neck pain. Stroke versus cervical spine disease. EXAM: MRI HEAD WITHOUT CONTRAST MRI CERVICAL SPINE WITHOUT CONTRAST TECHNIQUE: Multiplanar, multiecho pulse sequences of the brain and surrounding structures, and cervical spine, to include the craniocervical junction and cervicothoracic junction, were obtained without intravenous contrast. COMPARISON:  Head CT earlier same day.  MRI 06/24/2014 FINDINGS: MRI HEAD FINDINGS There is a 1 cm region of T2 and FLAIR abnormality within the left thalamus. No restricted diffusion presently. On the old MRI, there appear to be an old small vessel insult in this location. Elsewhere, the cerebral hemispheres show a few punctate foci of T2 and FLAIR signal in the deep white matter. No cortical or large vessel territory insult. No mass lesion, hemorrhage, hydrocephalus or extra-axial collection. No pituitary mass. No inflammatory sinus disease. No skull or  skullbase lesion. MRI CERVICAL SPINE FINDINGS The cervical spinal cord is diffusely abnormal showing enlargement and inter medullary T2 signal. This extends from the obex all the way to the C7 level. This is consistent with acute deep mild is a shin or myelitis. There are no degenerative changes. There is no compressive pathology. IMPRESSION: 1 cm region of abnormal T2 and FLAIR signal within the left thalamus not consistent with acute infarction. This could be an acute focus of demyelination or conceivably but less likely a late subacute infarction. Diffusely abnormal cervical spinal cord which is swollen and shows diffuse abnormal T2 signal. This is likely to represent acute demyelination/neuromyelitis optica (Devic's disease) or acute transverse myelitis. Quite likely that the findings of the left thalamus in the spinal cord are due to the same pathology. Electronically Signed   By: Nelson Chimes M.D.   On: 09/21/2015 20:41   Mr Jeri Cos Contrast  09/22/2015  CLINICAL DATA:  50 yo female with his history of bilateral optic neuritis now with longitudinally extensive transverse myelitis. Weakness of arms and legs. EXAM: MRI CERVICAL SPINE WITH CONTRAST; MRI HEAD WITH CONTRAST TECHNIQUE: Multiplanar and multiecho pulse sequences of the cervical spine, to include the craniocervical junction and cervicothoracic junction, were obtained according to standard protocol with intravenous contrast.; Multiplanar, multiecho pulse sequences of the brain and surrounding structures were obtained according to standard protocol with intravenous contrast CONTRAST:  15mL MULTIHANCE GADOBENATE DIMEGLUMINE 529 MG/ML IV SOLN COMPARISON:  Noncontrast brain and cervical spine from 07/21/2016. FINDINGS: Post infusion images of the brain demonstrates no abnormal enhancement. LEFT thalamic lesion, T2 and FLAIR hyperintense, probably represents an atypical area of demyelination, but non acute, given the lack of restricted diffusion and  absence of blood brain barrier breakdown. In the cervical spine, there is extensive postcontrast enhancement, corresponding to the T2 and FLAIR hyperintensity on yesterday's exam, extending over multiple cervical segments, from the foramen magnum through C7. This enhancement involves both gray and white matter. There is greater involvement of the LEFT hemicord. In the setting of a patient with history of optic neuritis, the findings are consistent with Devic's disease. No features specific for optic neuritis can be observed on post infusion imaging through the brain, non dedicated orbit study. IMPRESSION: No  abnormal intracranial enhancement is documented. Heterogeneous enhancement of the enlarged cervical cord segments, from foramen magnum through C7, involving both gray and white matter, consistent with Devic's disease. Electronically Signed   By: Staci Righter M.D.   On: 09/22/2015 12:39   Mr Cervical Spine Wo Contrast  09/21/2015  CLINICAL DATA:  Left arm numbness and pain. Neck pain. Stroke versus cervical spine disease. EXAM: MRI HEAD WITHOUT CONTRAST MRI CERVICAL SPINE WITHOUT CONTRAST TECHNIQUE: Multiplanar, multiecho pulse sequences of the brain and surrounding structures, and cervical spine, to include the craniocervical junction and cervicothoracic junction, were obtained without intravenous contrast. COMPARISON:  Head CT earlier same day.  MRI 06/24/2014 FINDINGS: MRI HEAD FINDINGS There is a 1 cm region of T2 and FLAIR abnormality within the left thalamus. No restricted diffusion presently. On the old MRI, there appear to be an old small vessel insult in this location. Elsewhere, the cerebral hemispheres show a few punctate foci of T2 and FLAIR signal in the deep white matter. No cortical or large vessel territory insult. No mass lesion, hemorrhage, hydrocephalus or extra-axial collection. No pituitary mass. No inflammatory sinus disease. No skull or skullbase lesion. MRI CERVICAL SPINE FINDINGS The  cervical spinal cord is diffusely abnormal showing enlargement and inter medullary T2 signal. This extends from the obex all the way to the C7 level. This is consistent with acute deep mild is a shin or myelitis. There are no degenerative changes. There is no compressive pathology. IMPRESSION: 1 cm region of abnormal T2 and FLAIR signal within the left thalamus not consistent with acute infarction. This could be an acute focus of demyelination or conceivably but less likely a late subacute infarction. Diffusely abnormal cervical spinal cord which is swollen and shows diffuse abnormal T2 signal. This is likely to represent acute demyelination/neuromyelitis optica (Devic's disease) or acute transverse myelitis. Quite likely that the findings of the left thalamus in the spinal cord are due to the same pathology. Electronically Signed   By: Nelson Chimes M.D.   On: 09/21/2015 20:41   Mr Cervical Spine W Contrast  09/22/2015  CLINICAL DATA:  50 yo female with his history of bilateral optic neuritis now with longitudinally extensive transverse myelitis. Weakness of arms and legs. EXAM: MRI CERVICAL SPINE WITH CONTRAST; MRI HEAD WITH CONTRAST TECHNIQUE: Multiplanar and multiecho pulse sequences of the cervical spine, to include the craniocervical junction and cervicothoracic junction, were obtained according to standard protocol with intravenous contrast.; Multiplanar, multiecho pulse sequences of the brain and surrounding structures were obtained according to standard protocol with intravenous contrast CONTRAST:  6mL MULTIHANCE GADOBENATE DIMEGLUMINE 529 MG/ML IV SOLN COMPARISON:  Noncontrast brain and cervical spine from 07/21/2016. FINDINGS: Post infusion images of the brain demonstrates no abnormal enhancement. LEFT thalamic lesion, T2 and FLAIR hyperintense, probably represents an atypical area of demyelination, but non acute, given the lack of restricted diffusion and absence of blood brain barrier breakdown. In  the cervical spine, there is extensive postcontrast enhancement, corresponding to the T2 and FLAIR hyperintensity on yesterday's exam, extending over multiple cervical segments, from the foramen magnum through C7. This enhancement involves both gray and white matter. There is greater involvement of the LEFT hemicord. In the setting of a patient with history of optic neuritis, the findings are consistent with Devic's disease. No features specific for optic neuritis can be observed on post infusion imaging through the brain, non dedicated orbit study. IMPRESSION: No abnormal intracranial enhancement is documented. Heterogeneous enhancement of the enlarged cervical cord segments, from  foramen magnum through C7, involving both gray and white matter, consistent with Devic's disease. Electronically Signed   By: Staci Righter M.D.   On: 09/22/2015 12:39   Ir Fluoro Guide Cv Line Right  09/23/2015  CLINICAL DATA:  Transverse myelitis.  Plasmapheresis. EXAM: TUNNELED DIALYSIS CATHETER PLACEMENT, ULTRASOUND GUIDANCE FOR VASCULAR ACCESS FLUOROSCOPY TIME:  24 seconds MEDICATIONS AND MEDICAL HISTORY: Versed 1.5 mg, Fentanyl 75 mcg. Additional Medications: As antibiotic prophylaxis, Ancef was ordered pre-procedure and administered intravenously within one hour of incision. ANESTHESIA/SEDATION: Moderate sedation time: 20 minutes CONTRAST:  None PROCEDURE: The procedure, risks, benefits, and alternatives were explained to the patient. Questions regarding the procedure were encouraged and answered. The patient understands and consents to the procedure. The right neck was prepped with ChloraPrep in a sterile fashion, and a sterile drape was applied covering the operative field. A sterile gown and sterile gloves were used for the procedure. 1% lidocaine into the skin and subcutaneous tissue. The right jugular vein was noted to be patent initially with ultrasound. Under sonographic guidance, a micropuncture needle was inserted into  the right IJ vein (Ultrasound and fluoroscopic image documentation was performed). It was removed over an 018 wire which was upsized to an Amplatz. This was advanced into the IVC. A small incision was made in the right upper chest. The tunneling device was utilized to advance the 23 centimeter tip to cuff catheter from the chest incision and out the neck incision. A peel-away sheath was advanced over the Amplatz wire. The leading edge of the catheter was then advanced through the peel-away sheath. The peel-away sheath was removed. It was flushed and instilled with heparin. The chest incision was closed with a 0 Prolene pursestring stitch. The neck incision was closed with a 4-0 Vicryl subcuticular stitch. COMPLICATIONS: None FINDINGS: The image demonstrates placement of a tunneled dialysis catheter with its tip in the right atrium. IMPRESSION: Successful right IJ vein tunneled dialysis catheter with its tip in the right atrium. Electronically Signed   By: Marybelle Killings M.D.   On: 09/23/2015 12:33   Ir US Guide Vasc Access Right  09/23/2015  CLINICAL DATA:  Transverse myelitis.  Plasmapheresis. EXAM: TUNNELED DIALYSIS CATHETER PLACEMENT, ULTRASOUND GUIDANCE FOR VASCULAR ACCESS FLUOROSCOPY TIME:  24 seconds MEDICATIONS AND MEDICAL HISTORY: Versed 1.5 mg, Fentanyl 75 mcg. Additional Medications: As antibiotic prophylaxis, Ancef was ordered pre-procedure and administered intravenously within one hour of incision. ANESTHESIA/SEDATION: Moderate sedation time: 20 minutes CONTRAST:  None PROCEDURE: The procedure, risks, benefits, and alternatives were explained to the patient. Questions regarding the procedure were encouraged and answered. The patient understands and consents to the procedure. The right neck was prepped with ChloraPrep in a sterile fashion, and a sterile drape was applied covering the operative field. A sterile gown and sterile gloves were used for the procedure. 1% lidocaine into the skin and  subcutaneous tissue. The right jugular vein was noted to be patent initially with ultrasound. Under sonographic guidance, a micropuncture needle was inserted into the right IJ vein (Ultrasound and fluoroscopic image documentation was performed). It was removed over an 018 wire which was upsized to an Amplatz. This was advanced into the IVC. A small incision was made in the right upper chest. The tunneling device was utilized to advance the 23 centimeter tip to cuff catheter from the chest incision and out the neck incision. A peel-away sheath was advanced over the Amplatz wire. The leading edge of the catheter was then advanced through the peel-away sheath. The peel-away  sheath was removed. It was flushed and instilled with heparin. The chest incision was closed with a 0 Prolene pursestring stitch. The neck incision was closed with a 4-0 Vicryl subcuticular stitch. COMPLICATIONS: None FINDINGS: The image demonstrates placement of a tunneled dialysis catheter with its tip in the right atrium. IMPRESSION: Successful right IJ vein tunneled dialysis catheter with its tip in the right atrium. Electronically Signed   By: Marybelle Killings M.D.   On: 09/23/2015 12:33   Dg Fluoro Guide Lumbar Puncture  09/22/2015  CLINICAL DATA:  Transverse myelitis. EXAM: DIAGNOSTIC LUMBAR PUNCTURE UNDER FLUOROSCOPIC GUIDANCE FLUOROSCOPY TIME:  Fluoroscopy Time (in minutes and seconds): 0 minutes, 37 seconds Number of Acquired Images:  0 PROCEDURE: I discussed the risks (including hemorrhage, infection, headache, and nerve damage, among others), benefits, and alternatives to fluoroscopically guided lumbar puncture with the patient. We specifically discussed the high technical likelihood of success of the procedure. The patient understood and elected to undergo the procedure. Standard time-out was employed. Following sterile skin prep and local anesthetic administration consisting of 1 percent lidocaine, a 22 gauge spinal needle was advanced  without difficulty into the thecal sac at the at the L4-5 level. Clear CSF was returned. Opening pressure was 17 cm of water. 13 cc of clear CSF was collected. The needle was subsequently removed and the skin cleansed and bandaged. No immediate complications were observed. IMPRESSION: 1. Technically successful fluoroscopically guided lumbar puncture at the L4-5 level. No complicating feature observed. Opening pressure 17 cm of water. Electronically Signed   By: Van Clines M.D.   On: 09/22/2015 09:15     CBC  Recent Labs Lab 09/22/15 0351 09/23/15 0433 09/24/15 0415 09/25/15 0531 09/26/15 0401  WBC 4.8 6.1 14.7* 14.2* 12.5*  HGB 11.7* 12.1 11.9* 11.8* 11.8*  HCT 36.5 38.0 37.6 36.8 36.4  PLT 236 187 203 145* 146*  MCV 71.3* 72.5* 73.0* 72.4* 71.9*  MCH 22.9* 23.1* 23.1* 23.2* 23.3*  MCHC 32.1 31.8 31.6 32.1 32.4  RDW 14.0 14.4 14.8 14.9 14.8  LYMPHSABS 0.3* 0.4* 0.6* 0.7 0.7  MONOABS 0.2 0.1 0.3 0.3 0.2  EOSABS 0.0 0.0 0.0 0.0 0.0  BASOSABS 0.0 0.0 0.0 0.0 0.0    Chemistries   Recent Labs Lab 09/21/15 1422 09/21/15 1600 09/22/15 0350 09/22/15 0351 09/23/15 0433 09/24/15 0415  NA 135 137  --  137 139 141  K 4.7 5.4*  --  3.7 5.0 4.7  CL 104 106  --  107 110 117*  CO2 19*  --   --  20* 21* 19*  GLUCOSE 91 88  --  135* 124* 132*  BUN 12 18  --  9 9 13   CREATININE 0.80 0.80 0.80 0.77 0.69 0.69  CALCIUM 10.1  --   --  9.2 9.6 8.8*  MG  --   --   --  1.8  --   --   AST 63*  --   --  45* 50* 21  ALT 48  --   --  40 48 18  ALKPHOS 54  --   --  50 54 22*  BILITOT 1.2  --   --  1.8* 0.8 0.5   ------------------------------------------------------------------------------------------------------------------ No results for input(s): CHOL, HDL, LDLCALC, TRIG, CHOLHDL, LDLDIRECT in the last 72 hours.  Lab Results  Component Value Date   HGBA1C 6.0* 09/22/2015    ------------------------------------------------------------------------------------------------------------------ No results for input(s): TSH, T4TOTAL, T3FREE, THYROIDAB in the last 72 hours.  Invalid input(s): FREET3 ------------------------------------------------------------------------------------------------------------------ No results  for input(s): VITAMINB12, FOLATE, FERRITIN, TIBC, IRON, RETICCTPCT in the last 72 hours.  Coagulation profile  Recent Labs Lab 09/23/15 0433 09/26/15 0850  INR 1.07 1.36    No results for input(s): DDIMER in the last 72 hours.  Cardiac Enzymes No results for input(s): CKMB, TROPONINI, MYOGLOBIN in the last 168 hours.  Invalid input(s): CK ------------------------------------------------------------------------------------------------------------------ No results found for: BNP  Time Spent in minutes   35   Lala Lund K M.D on 09/26/2015 at 10:02 AM  Between 7am to 7pm - Pager - [phone]  After 7pm go to [email or web address] - password [facility]  [facility] -  Office  [phone]

## 2015-09-26 NOTE — Progress Notes (Signed)
I met with pt at bedside to discuss a possible inpt rehab admission pending caregiver support at home which we expect will be needed and insurance approval when medically ready. I will contact her son, Andrea Berry, to discuss. [phone]

## 2015-09-26 NOTE — Care Management Important Message (Signed)
Important Message  Patient Details  Name: Andrea Berry MRN: [id number] Date of Birth: 06-21-1966   Medicare Important Message Given:  Yes    [name] P Prem Coykendall 09/26/2015, 3:57 PM

## 2015-09-26 NOTE — Progress Notes (Signed)
TPE #3/5 completed without issue. Pt has no complaints. Tolerated well. Post vitals BP129/83  HR 83 O2 99%RA RR18. Report called to primary RN.

## 2015-09-26 NOTE — Progress Notes (Signed)
Pt transferred to bedside commode with assist x2.

## 2015-09-26 NOTE — Progress Notes (Signed)
PT Cancellation Note  Patient Details Name: Andrea Berry MRN: [id number] DOB: Oct 08, 1965   Cancelled Treatment:    Reason Eval/Treat Not Completed: Patient at procedure or test/unavailable   Off the floor for plasma exchange;   Will follow up later today as time allows;  Otherwise, will follow up for PT tomorrow;   Thank you,  Roney Marion, PT  Acute Rehabilitation Services Pager [phone] Office [phone]     Roney Marion [facility] 09/26/2015, 1:51 PM

## 2015-09-27 LAB — GLUCOSE, CAPILLARY
GLUCOSE-CAPILLARY: 124 mg/dL — AB (ref 65–99)
GLUCOSE-CAPILLARY: 113 mg/dL — AB (ref 65–99)
GLUCOSE-CAPILLARY: 133 mg/dL — AB (ref 65–99)
Glucose-Capillary: 125 mg/dL — ABNORMAL HIGH (ref 65–99)
Glucose-Capillary: 168 mg/dL — ABNORMAL HIGH (ref 65–99)

## 2015-09-27 LAB — CBC WITH DIFFERENTIAL/PLATELET
BASOS PCT: 0 %
Basophils Absolute: 0 10*3/uL (ref 0.0–0.1)
Eosinophils Absolute: 0 10*3/uL (ref 0.0–0.7)
Eosinophils Relative: 0 %
HEMATOCRIT: 37.4 % (ref 36.0–46.0)
HEMOGLOBIN: 12 g/dL (ref 12.0–15.0)
LYMPHS PCT: 6 %
Lymphs Abs: 0.6 10*3/uL — ABNORMAL LOW (ref 0.7–4.0)
MCH: 23.1 pg — ABNORMAL LOW (ref 26.0–34.0)
MCHC: 32.1 g/dL (ref 30.0–36.0)
MCV: 72.1 fL — ABNORMAL LOW (ref 78.0–100.0)
MONOS PCT: 2 %
Monocytes Absolute: 0.2 10*3/uL (ref 0.1–1.0)
NEUTROS ABS: 9.8 10*3/uL — AB (ref 1.7–7.7)
NEUTROS PCT: 92 %
Platelets: 144 10*3/uL — ABNORMAL LOW (ref 150–400)
RBC: 5.19 MIL/uL — ABNORMAL HIGH (ref 3.87–5.11)
RDW: 14.4 % (ref 11.5–15.5)
WBC: 10.6 10*3/uL — ABNORMAL HIGH (ref 4.0–10.5)

## 2015-09-27 LAB — TSH: TSH: 0.273 u[IU]/mL — ABNORMAL LOW (ref 0.350–4.500)

## 2015-09-27 LAB — T4, FREE: FREE T4: 0.55 ng/dL — AB (ref 0.61–1.12)

## 2015-09-27 LAB — ANGIOTENSIN CONVERTING ENZYME, CSF: Angio Convert Enzyme: 1 U/L (ref 0.0–2.5)

## 2015-09-27 NOTE — Progress Notes (Signed)
Patient Demographics:    Andrea Berry, is a 50 y.o. female, DOB - [birth year], [id number]  Admit date - 09/21/2015   Admitting Physician Reubin Milan, MD  Outpatient Primary MD for the patient is [name], NP  LOS - 5  Summary  Pleasant 50 year old African-American female with history of CVA in the past, hypertension, GERD, anxiety, depression, lupus with legal blindness, who is chronically on prednisone presented to the hospital with chief complaints of left-sided weakness are more than leg, in the ER she was seen by neurology and workup was consistent with Devic's disease. She was started on IV Solu-Medrol along with plasma exchange and admitted to my service. She has minimal improvement in her left arm strength, we'll complete her plasma exchange treatments on 09/30/2015 and thereafter likely be discharged to SNF/CIR.   Chief Complaint  Patient presents with  . Extremity Weakness        Subjective:    Andrea Berry today has, No headache, No chest pain, No abdominal pain - No Nausea, No new weakness tingling or numbness, No Cough - SOB. +ve severe left arm weakness and mild L leg weakness.   Assessment  & Plan :     1. Severe left arm weakness ongoing for 3 days. Underlying history of legal blindness. Secondary to Devic's disease, neurology on board. Patient on Solu-Medrol  total of 5 doses stop date 09/26/2015 and due for plasma exchange every other day for a total 5 runs to be finished on 09/30/2015 as directed by neurology. Further management of this problem defer to neurology. Continue supportive care with PT OT. May require placement. Uncertain social work and CIR on 09/25/2015.  2. History of lupus. On prednisone at home, currently on Solu-Medrol along with Imuran. Outpatient  follow-up with Dr. Estanislado Pandy postdischarge.  3. History of TIA/stroke in the past. No acute issues.  4. Prolonged QTC upon admission. IV magnesium, low-dose beta blocker, monitor on telemetry. Resolved on repeat EKG obtained 09/26/2015.  5. Dehydration with tachycardia. Hydrated, and improved,  stable echogram.  6. GERD. On PPI.  7. Leuokocytosis due to steroids. Afebrile monitor.  8. Low TSH. Could be sick euthyroid, repeat TSH along with T3 and free T4.    9. Steroid induced hyperglycemia. I assess and monitor.  CBG (last 3)   Recent Labs  09/26/15 1624 09/26/15 2201 09/27/15 0649  GLUCAP 133* 111* 124*      Code Status : Full  Family Communication  : son  Disposition Plan  : Stay inpatient  Consults  :  Neurology  Procedures  :   CT head, MRI brain and C-spine consistent with Devic's disease.  Lumbar puncture not consistent with meningitis (D/W - Dr Leonel Ramsay)  Right IJ venous dialysis catheter placed by IR on 09/23/2015  TTE   - Left ventricle: The cavity size was normal. Wall thickness wasincreased in a pattern of moderate to severe LVH. Systolicfunction was vigorous. The estimated ejection fraction was in the range of 65% to 70%. There was dynamic obstruction in the mid cavity, with a peak velocity of 314 cm/sec and a peak gradient of39 mm Hg. Wall motion was normal; there were no regional wall motion abnormalities. Doppler parameters are consistent with abnormal left ventricular relaxation (  grade 1 diastolic dysfunction). - Left atrium: The atrium was mildly dilated. - Pericardium, extracardiac: A small pericardial effusion was identified circumferential to the heart.   DVT Prophylaxis  :  Lovenox  Lab Results  Component Value Date   PLT 144* 09/27/2015    Inpatient Medications  Scheduled Meds: .  stroke: mapping our early stages of recovery book   Does not apply Once  . azaTHIOprine  50 mg Oral Daily  . calcium gluconate IVPB  2 g  Intravenous Once  . enoxaparin (LOVENOX) injection  40 mg Subcutaneous Q0600  . heparin  1,000 Units Intracatheter Once  . insulin aspart  0-15 Units Subcutaneous TID WC  . insulin aspart  0-5 Units Subcutaneous QHS  . metoprolol tartrate  25 mg Oral BID  . multivitamin with minerals  1 tablet Oral Daily  . pantoprazole  40 mg Oral Daily  . pregabalin  75 mg Oral TID  . sodium chloride  3 mL Intravenous Q12H  . sodium chloride  3 mL Intravenous Q12H   Continuous Infusions: . citrate dextrose     PRN Meds:.sodium chloride, acetaminophen, diphenhydrAMINE, LORazepam, ondansetron **OR** ondansetron (ZOFRAN) IV, oxyCODONE, senna-docusate, sodium chloride  Antibiotics  :    Anti-infectives    Start     Dose/Rate Route Frequency Ordered Stop   09/23/15 0815  ceFAZolin (ANCEF) IVPB 2 g/50 mL premix     2 g 100 mL/hr over 30 Minutes Intravenous  Once 09/23/15 0814 09/23/15 0922        Objective:   Filed Vitals:   09/27/15 0138 09/27/15 0616 09/27/15 0900 09/27/15 0931  BP: 122/79 158/74  133/79  Pulse: 70 97  84  Temp: 97.7 F (36.5 C) 98 F (36.7 C)  98.5 F (36.9 C)  TempSrc: Oral Oral  Oral  Resp: 16 16  20   Height:      Weight:   87.5 kg (192 lb 14.4 oz)   SpO2: 97% 100%  99%    Wt Readings from Last 3 Encounters:  09/27/15 87.5 kg (192 lb 14.4 oz)  02/08/14 73.199 kg (161 lb 6 oz)  01/29/14 75.297 kg (166 lb)     Intake/Output Summary (Last 24 hours) at 09/27/15 1131 Last data filed at 09/27/15 0933  Gross per 24 hour  Intake    240 ml  Output      0 ml  Net    240 ml     Physical Exam  Awake Alert, Oriented X 3, No new F.N deficits, left arm strength 1/5,  L leg 4/5, legally blind, Normal affect [place].AT,PERRAL Supple Neck,No JVD, No cervical lymphadenopathy appriciated. Right IJ dialysis catheter in place. Symmetrical Chest wall movement, Good air movement bilaterally, CTAB RRR,No Gallops,Rubs or new Murmurs, No Parasternal Heave +ve B.Sounds, Abd Soft,  No tenderness, No organomegaly appriciated, No rebound - guarding or rigidity. No Cyanosis, Clubbing or edema, No new Rash or bruise       Data Review:   Micro Results Recent Results (from the past 240 hour(s))  CSF culture     Status: None   Collection Time: 09/22/15  8:57 AM  Result Value Ref Range Status   Specimen Description CSF TUBE 2  Final   Special Requests NONE  Final   Gram Stain   Final    WBC PRESENT,BOTH PMN AND MONONUCLEAR NO ORGANISMS SEEN CYTOSPIN SMEAR    Culture NO GROWTH 3 DAYS  Final   Report Status 09/25/2015 FINAL  Final  Radiology Reports Dg Chest 2 View  09/22/2015  CLINICAL DATA:  TIA. EXAM: CHEST  2 VIEW COMPARISON:  04/29/2012 FINDINGS: Lung volumes are low. Cardiomegaly is unchanged allowing for differences in technique. Minimal bibasilar subsegmental atelectasis no pulmonary edema, consolidation, pleural effusion or pneumothorax. No acute osseous abnormalities are seen. IMPRESSION: Low lung volumes with stable cardiomegaly. Electronically Signed   By: Jeb Levering M.D.   On: 09/22/2015 03:36   Ct Head Wo Contrast  09/21/2015  CLINICAL DATA:  Acute left-sided weakness. EXAM: CT HEAD WITHOUT CONTRAST TECHNIQUE: Contiguous axial images were obtained from the base of the skull through the vertex without intravenous contrast. COMPARISON:  CT scan of June 24, 2014. FINDINGS: Bony calvarium appears intact. No mass effect or midline shift is noted. Ventricular size is within normal limits. There is no evidence of mass lesion, hemorrhage or acute infarction. IMPRESSION: Normal head CT. Electronically Signed   By: Marijo Conception, M.D.   On: 09/21/2015 16:27   Mr Brain Wo Contrast  09/21/2015  CLINICAL DATA:  Left arm numbness and pain. Neck pain. Stroke versus cervical spine disease. EXAM: MRI HEAD WITHOUT CONTRAST MRI CERVICAL SPINE WITHOUT CONTRAST TECHNIQUE: Multiplanar, multiecho pulse sequences of the brain and surrounding structures, and cervical  spine, to include the craniocervical junction and cervicothoracic junction, were obtained without intravenous contrast. COMPARISON:  Head CT earlier same day.  MRI 06/24/2014 FINDINGS: MRI HEAD FINDINGS There is a 1 cm region of T2 and FLAIR abnormality within the left thalamus. No restricted diffusion presently. On the old MRI, there appear to be an old small vessel insult in this location. Elsewhere, the cerebral hemispheres show a few punctate foci of T2 and FLAIR signal in the deep white matter. No cortical or large vessel territory insult. No mass lesion, hemorrhage, hydrocephalus or extra-axial collection. No pituitary mass. No inflammatory sinus disease. No skull or skullbase lesion. MRI CERVICAL SPINE FINDINGS The cervical spinal cord is diffusely abnormal showing enlargement and inter medullary T2 signal. This extends from the obex all the way to the C7 level. This is consistent with acute deep mild is a shin or myelitis. There are no degenerative changes. There is no compressive pathology. IMPRESSION: 1 cm region of abnormal T2 and FLAIR signal within the left thalamus not consistent with acute infarction. This could be an acute focus of demyelination or conceivably but less likely a late subacute infarction. Diffusely abnormal cervical spinal cord which is swollen and shows diffuse abnormal T2 signal. This is likely to represent acute demyelination/neuromyelitis optica (Devic's disease) or acute transverse myelitis. Quite likely that the findings of the left thalamus in the spinal cord are due to the same pathology. Electronically Signed   By: Nelson Chimes M.D.   On: 09/21/2015 20:41   Mr Jeri Cos Contrast  09/22/2015  CLINICAL DATA:  50 yo female with his history of bilateral optic neuritis now with longitudinally extensive transverse myelitis. Weakness of arms and legs. EXAM: MRI CERVICAL SPINE WITH CONTRAST; MRI HEAD WITH CONTRAST TECHNIQUE: Multiplanar and multiecho pulse sequences of the cervical  spine, to include the craniocervical junction and cervicothoracic junction, were obtained according to standard protocol with intravenous contrast.; Multiplanar, multiecho pulse sequences of the brain and surrounding structures were obtained according to standard protocol with intravenous contrast CONTRAST:  40mL MULTIHANCE GADOBENATE DIMEGLUMINE 529 MG/ML IV SOLN COMPARISON:  Noncontrast brain and cervical spine from 07/21/2016. FINDINGS: Post infusion images of the brain demonstrates no abnormal enhancement. LEFT thalamic lesion, T2 and FLAIR  hyperintense, probably represents an atypical area of demyelination, but non acute, given the lack of restricted diffusion and absence of blood brain barrier breakdown. In the cervical spine, there is extensive postcontrast enhancement, corresponding to the T2 and FLAIR hyperintensity on yesterday's exam, extending over multiple cervical segments, from the foramen magnum through C7. This enhancement involves both gray and white matter. There is greater involvement of the LEFT hemicord. In the setting of a patient with history of optic neuritis, the findings are consistent with Devic's disease. No features specific for optic neuritis can be observed on post infusion imaging through the brain, non dedicated orbit study. IMPRESSION: No abnormal intracranial enhancement is documented. Heterogeneous enhancement of the enlarged cervical cord segments, from foramen magnum through C7, involving both gray and white matter, consistent with Devic's disease. Electronically Signed   By: Staci Righter M.D.   On: 09/22/2015 12:39   Mr Cervical Spine Wo Contrast  09/21/2015  CLINICAL DATA:  Left arm numbness and pain. Neck pain. Stroke versus cervical spine disease. EXAM: MRI HEAD WITHOUT CONTRAST MRI CERVICAL SPINE WITHOUT CONTRAST TECHNIQUE: Multiplanar, multiecho pulse sequences of the brain and surrounding structures, and cervical spine, to include the craniocervical junction and  cervicothoracic junction, were obtained without intravenous contrast. COMPARISON:  Head CT earlier same day.  MRI 06/24/2014 FINDINGS: MRI HEAD FINDINGS There is a 1 cm region of T2 and FLAIR abnormality within the left thalamus. No restricted diffusion presently. On the old MRI, there appear to be an old small vessel insult in this location. Elsewhere, the cerebral hemispheres show a few punctate foci of T2 and FLAIR signal in the deep white matter. No cortical or large vessel territory insult. No mass lesion, hemorrhage, hydrocephalus or extra-axial collection. No pituitary mass. No inflammatory sinus disease. No skull or skullbase lesion. MRI CERVICAL SPINE FINDINGS The cervical spinal cord is diffusely abnormal showing enlargement and inter medullary T2 signal. This extends from the obex all the way to the C7 level. This is consistent with acute deep mild is a shin or myelitis. There are no degenerative changes. There is no compressive pathology. IMPRESSION: 1 cm region of abnormal T2 and FLAIR signal within the left thalamus not consistent with acute infarction. This could be an acute focus of demyelination or conceivably but less likely a late subacute infarction. Diffusely abnormal cervical spinal cord which is swollen and shows diffuse abnormal T2 signal. This is likely to represent acute demyelination/neuromyelitis optica (Devic's disease) or acute transverse myelitis. Quite likely that the findings of the left thalamus in the spinal cord are due to the same pathology. Electronically Signed   By: Nelson Chimes M.D.   On: 09/21/2015 20:41   Mr Cervical Spine W Contrast  09/22/2015  CLINICAL DATA:  50 yo female with his history of bilateral optic neuritis now with longitudinally extensive transverse myelitis. Weakness of arms and legs. EXAM: MRI CERVICAL SPINE WITH CONTRAST; MRI HEAD WITH CONTRAST TECHNIQUE: Multiplanar and multiecho pulse sequences of the cervical spine, to include the craniocervical  junction and cervicothoracic junction, were obtained according to standard protocol with intravenous contrast.; Multiplanar, multiecho pulse sequences of the brain and surrounding structures were obtained according to standard protocol with intravenous contrast CONTRAST:  30mL MULTIHANCE GADOBENATE DIMEGLUMINE 529 MG/ML IV SOLN COMPARISON:  Noncontrast brain and cervical spine from 07/21/2016. FINDINGS: Post infusion images of the brain demonstrates no abnormal enhancement. LEFT thalamic lesion, T2 and FLAIR hyperintense, probably represents an atypical area of demyelination, but non acute, given the lack  of restricted diffusion and absence of blood brain barrier breakdown. In the cervical spine, there is extensive postcontrast enhancement, corresponding to the T2 and FLAIR hyperintensity on yesterday's exam, extending over multiple cervical segments, from the foramen magnum through C7. This enhancement involves both gray and white matter. There is greater involvement of the LEFT hemicord. In the setting of a patient with history of optic neuritis, the findings are consistent with Devic's disease. No features specific for optic neuritis can be observed on post infusion imaging through the brain, non dedicated orbit study. IMPRESSION: No abnormal intracranial enhancement is documented. Heterogeneous enhancement of the enlarged cervical cord segments, from foramen magnum through C7, involving both gray and white matter, consistent with Devic's disease. Electronically Signed   By: Staci Righter M.D.   On: 09/22/2015 12:39   Ir Fluoro Guide Cv Line Right  09/23/2015  CLINICAL DATA:  Transverse myelitis.  Plasmapheresis. EXAM: TUNNELED DIALYSIS CATHETER PLACEMENT, ULTRASOUND GUIDANCE FOR VASCULAR ACCESS FLUOROSCOPY TIME:  24 seconds MEDICATIONS AND MEDICAL HISTORY: Versed 1.5 mg, Fentanyl 75 mcg. Additional Medications: As antibiotic prophylaxis, Ancef was ordered pre-procedure and administered intravenously within  one hour of incision. ANESTHESIA/SEDATION: Moderate sedation time: 20 minutes CONTRAST:  None PROCEDURE: The procedure, risks, benefits, and alternatives were explained to the patient. Questions regarding the procedure were encouraged and answered. The patient understands and consents to the procedure. The right neck was prepped with ChloraPrep in a sterile fashion, and a sterile drape was applied covering the operative field. A sterile gown and sterile gloves were used for the procedure. 1% lidocaine into the skin and subcutaneous tissue. The right jugular vein was noted to be patent initially with ultrasound. Under sonographic guidance, a micropuncture needle was inserted into the right IJ vein (Ultrasound and fluoroscopic image documentation was performed). It was removed over an 018 wire which was upsized to an Amplatz. This was advanced into the IVC. A small incision was made in the right upper chest. The tunneling device was utilized to advance the 23 centimeter tip to cuff catheter from the chest incision and out the neck incision. A peel-away sheath was advanced over the Amplatz wire. The leading edge of the catheter was then advanced through the peel-away sheath. The peel-away sheath was removed. It was flushed and instilled with heparin. The chest incision was closed with a 0 Prolene pursestring stitch. The neck incision was closed with a 4-0 Vicryl subcuticular stitch. COMPLICATIONS: None FINDINGS: The image demonstrates placement of a tunneled dialysis catheter with its tip in the right atrium. IMPRESSION: Successful right IJ vein tunneled dialysis catheter with its tip in the right atrium. Electronically Signed   By: Marybelle Killings M.D.   On: 09/23/2015 12:33   Ir US Guide Vasc Access Right  09/23/2015  CLINICAL DATA:  Transverse myelitis.  Plasmapheresis. EXAM: TUNNELED DIALYSIS CATHETER PLACEMENT, ULTRASOUND GUIDANCE FOR VASCULAR ACCESS FLUOROSCOPY TIME:  24 seconds MEDICATIONS AND MEDICAL HISTORY:  Versed 1.5 mg, Fentanyl 75 mcg. Additional Medications: As antibiotic prophylaxis, Ancef was ordered pre-procedure and administered intravenously within one hour of incision. ANESTHESIA/SEDATION: Moderate sedation time: 20 minutes CONTRAST:  None PROCEDURE: The procedure, risks, benefits, and alternatives were explained to the patient. Questions regarding the procedure were encouraged and answered. The patient understands and consents to the procedure. The right neck was prepped with ChloraPrep in a sterile fashion, and a sterile drape was applied covering the operative field. A sterile gown and sterile gloves were used for the procedure. 1% lidocaine into the skin and  subcutaneous tissue. The right jugular vein was noted to be patent initially with ultrasound. Under sonographic guidance, a micropuncture needle was inserted into the right IJ vein (Ultrasound and fluoroscopic image documentation was performed). It was removed over an 018 wire which was upsized to an Amplatz. This was advanced into the IVC. A small incision was made in the right upper chest. The tunneling device was utilized to advance the 23 centimeter tip to cuff catheter from the chest incision and out the neck incision. A peel-away sheath was advanced over the Amplatz wire. The leading edge of the catheter was then advanced through the peel-away sheath. The peel-away sheath was removed. It was flushed and instilled with heparin. The chest incision was closed with a 0 Prolene pursestring stitch. The neck incision was closed with a 4-0 Vicryl subcuticular stitch. COMPLICATIONS: None FINDINGS: The image demonstrates placement of a tunneled dialysis catheter with its tip in the right atrium. IMPRESSION: Successful right IJ vein tunneled dialysis catheter with its tip in the right atrium. Electronically Signed   By: Marybelle Killings M.D.   On: 09/23/2015 12:33   Dg Fluoro Guide Lumbar Puncture  09/22/2015  CLINICAL DATA:  Transverse myelitis. EXAM:  DIAGNOSTIC LUMBAR PUNCTURE UNDER FLUOROSCOPIC GUIDANCE FLUOROSCOPY TIME:  Fluoroscopy Time (in minutes and seconds): 0 minutes, 37 seconds Number of Acquired Images:  0 PROCEDURE: I discussed the risks (including hemorrhage, infection, headache, and nerve damage, among others), benefits, and alternatives to fluoroscopically guided lumbar puncture with the patient. We specifically discussed the high technical likelihood of success of the procedure. The patient understood and elected to undergo the procedure. Standard time-out was employed. Following sterile skin prep and local anesthetic administration consisting of 1 percent lidocaine, a 22 gauge spinal needle was advanced without difficulty into the thecal sac at the at the L4-5 level. Clear CSF was returned. Opening pressure was 17 cm of water. 13 cc of clear CSF was collected. The needle was subsequently removed and the skin cleansed and bandaged. No immediate complications were observed. IMPRESSION: 1. Technically successful fluoroscopically guided lumbar puncture at the L4-5 level. No complicating feature observed. Opening pressure 17 cm of water. Electronically Signed   By: Van Clines M.D.   On: 09/22/2015 09:15     CBC  Recent Labs Lab 09/23/15 0433  09/24/15 0415 09/24/15 1600 09/25/15 0531 09/26/15 0401 09/26/15 1258 09/27/15 0710  WBC 6.1  --  14.7*  --  14.2* 12.5*  --  10.6*  HGB 12.1  < > 11.9* 12.9 11.8* 11.8* 12.6 12.0  HCT 38.0  < > 37.6 38.0 36.8 36.4 37.0 37.4  PLT 187  --  203  --  145* 146*  --  144*  MCV 72.5*  --  73.0*  --  72.4* 71.9*  --  72.1*  MCH 23.1*  --  23.1*  --  23.2* 23.3*  --  23.1*  MCHC 31.8  --  31.6  --  32.1 32.4  --  32.1  RDW 14.4  --  14.8  --  14.9 14.8  --  14.4  LYMPHSABS 0.4*  --  0.6*  --  0.7 0.7  --  0.6*  MONOABS 0.1  --  0.3  --  0.3 0.2  --  0.2  EOSABS 0.0  --  0.0  --  0.0 0.0  --  0.0  BASOSABS 0.0  --  0.0  --  0.0 0.0  --  0.0  < > = values in this interval  not  displayed.  Chemistries   Recent Labs Lab 09/21/15 1422  09/22/15 0351 09/23/15 0433 09/23/15 1553 09/24/15 0415 09/24/15 1600 09/26/15 1258  NA 135  < > 137 139 140 141 143 142  K 4.7  < > 3.7 5.0 4.3 4.7 4.5 4.0  CL 104  < > 107 110 109 117* 110 106  CO2 19*  --  20* 21*  --  19*  --   --   GLUCOSE 91  < > 135* 124* 202* 132* 126* 160*  BUN 12  < > 9 9 12 13 17 19   CREATININE 0.80  < > 0.77 0.69 0.60 0.69 0.70 0.60  CALCIUM 10.1  --  9.2 9.6  --  8.8*  --   --   MG  --   --  1.8  --   --   --   --   --   AST 63*  --  45* 50*  --  21  --   --   ALT 48  --  40 48  --  18  --   --   ALKPHOS 54  --  50 54  --  22*  --   --   BILITOT 1.2  --  1.8* 0.8  --  0.5  --   --   < > = values in this interval not displayed. ------------------------------------------------------------------------------------------------------------------ No results for input(s): CHOL, HDL, LDLCALC, TRIG, CHOLHDL, LDLDIRECT in the last 72 hours.  Lab Results  Component Value Date   HGBA1C 6.0* 09/22/2015   ------------------------------------------------------------------------------------------------------------------  Recent Labs  09/26/15 1036  TSH 0.274*   ------------------------------------------------------------------------------------------------------------------ No results for input(s): VITAMINB12, FOLATE, FERRITIN, TIBC, IRON, RETICCTPCT in the last 72 hours.  Coagulation profile  Recent Labs Lab 09/23/15 0433 09/26/15 0850  INR 1.07 1.36    No results for input(s): DDIMER in the last 72 hours.  Cardiac Enzymes No results for input(s): CKMB, TROPONINI, MYOGLOBIN in the last 168 hours.  Invalid input(s): CK ------------------------------------------------------------------------------------------------------------------ No results found for: BNP  Time Spent in minutes   35   Lala Lund K M.D on 09/27/2015 at 11:31 AM  Between 7am to 7pm - Pager -  [phone]  After 7pm go to [email or web address] - password [facility]  [facility] -  Office  [phone]

## 2015-09-27 NOTE — Care Management Note (Signed)
Case Management Note  Patient Details  Name: Andrea Berry MRN: [id number] Date of Birth: Oct 25, 1965  Subjective/Objective:                    Action/Plan: Patient continues with plasma exchanges and IV steroids. CIR is following and CM will continue to follow for discharge needs.   Expected Discharge Date:                  Expected Discharge Plan:     In-House Referral:     Discharge planning Services     Post Acute Care Choice:    Choice offered to:     DME Arranged:    DME Agency:     HH Arranged:    HH Agency:     Status of Service:  In process, will continue to follow  Medicare Important Message Given:  Yes Date Medicare IM Given:    Medicare IM give by:    Date Additional Medicare IM Given:    Additional Medicare Important Message give by:     If discussed at [place] of Stay Meetings, dates discussed:    Additional Comments:  Pollie Friar, RN 09/27/2015, 4:17 PM

## 2015-09-27 NOTE — Progress Notes (Signed)
Subjective: Feels no subjective improvement.   Exam: Filed Vitals:   09/27/15 0616 09/27/15 0931  BP: 158/74 133/79  Pulse: 97 84  Temp: 98 F (36.7 C) 98.5 F (36.9 C)  Resp: 16 20     Neuro: MS: awake, alert, cooperative [id number], pupils are reactive.  Motor: 5/5 on right, 2/5 LUE, 3/5 LLE proximally, 3/5 distally Sensory:decreased on the left, also with some paresthesia on the right as well.     Pertinent Labs: WBC 10.6 PLT 144   Etta Quill PA-C [facility] [phone]  Impression: 50 yo female with history of bilateral optic neuritis now with longitudinally extensive transverse myelitis. She has finished 5 days of steroids and 3 PLX treatments with no significant improvement.    Recommendations: 1) finish PLX for 2 more treatment QOD next is tomorrow.  2) PT 3) will follow    09/27/2015, 11:04 AM Patient seen and examined together with physician assistant and I concur with the assessment and plan.  Dorian Pod, MD

## 2015-09-27 NOTE — Progress Notes (Signed)
I have left a message for pt's son, [name], to contact me to discuss rehab venue options. I await call back. [phone]

## 2015-09-27 NOTE — Progress Notes (Signed)
SLP Cancellation Note  Patient Details Name: Andrea Berry MRN: [id number] DOB: 07/08/1966   Cancelled treatment:       Reason Eval/Treat Not Completed: SLP screened, no needs identified, will sign off.  Patient reports no concerns with language or cognition at this time and is agreeable to SLP signing off at this time.    Gunnar Fusi, M.A., CCC-SLP [phone]  [place] 09/27/2015, 9:49 AM

## 2015-09-27 NOTE — Progress Notes (Signed)
Physical Therapy Treatment Patient Details Name: Andrea Berry MRN: [id number] DOB: 1965/12/15 Today's Date: 02-Oct-2015    History of Present Illness 50 yo female initially presenting with Lt UE weakness and numbness. 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 PMH: legally blind, hypertension, lupus, depression, CVA 2008.    PT Comments    Pt making slow, steady progress.  Follow Up Recommendations  CIR     Equipment Recommendations  Other (comment) (To be assessed)    Recommendations for Other Services       Precautions / Restrictions Precautions Precautions: Fall;Other (comment) Precaution Comments: Legally blind Restrictions Weight Bearing Restrictions: No    Mobility  Bed Mobility Overal bed mobility: Needs Assistance Bed Mobility: Supine to Sit     Supine to sit: Min assist;HOB elevated     General bed mobility comments: Assist to bring LLE off bed and to elevate trunk into sitting  Transfers Overall transfer level: Needs assistance Equipment used: Ambulation equipment used Transfers: Sit to/from [facility] Sit to Stand: Mod assist;+2 physical assistance Stand pivot transfers: Mod assist;+2 physical assistance (Using [place])       General transfer comment: Assist to bring hips and trunk up. Assist to extend lt knee  Ambulation/Gait                 Stairs            Wheelchair Mobility    Modified Rankin (Stroke Patients Only)       Balance Overall balance assessment: Needs assistance Sitting-balance support: No upper extremity supported;Feet supported Sitting balance-Leahy Scale: Fair     Standing balance support: Bilateral upper extremity supported Standing balance-Leahy Scale: Poor Standing balance comment: Stedy and min to mod A for static standing                    Cognition Arousal/Alertness: Awake/alert Behavior During Therapy: Flat affect Overall Cognitive Status: Within Functional Limits for tasks  assessed                      Exercises      General Comments        Pertinent Vitals/Pain Pain Assessment: Faces Faces Pain Scale: Hurts little more Pain Location: LUE Pain Descriptors / Indicators: Grimacing Pain Intervention(s): Limited activity within patient's tolerance;Repositioned    Home Living                      Prior Function            PT Goals (current goals can now be found in the care plan section) Progress towards PT goals: Progressing toward goals    Frequency  Min 3X/week    PT Plan Current plan remains appropriate    Co-evaluation             End of Session Equipment Utilized During Treatment: Gait belt Activity Tolerance: Patient limited by fatigue Patient left: in chair;with call bell/phone within reach;with chair alarm set     Time: [id number] PT Time Calculation (min) (ACUTE ONLY): 24 min  Charges:  $Therapeutic Activity: 23-37 mins                    G Codes:      Kenneith Stief 10-02-15, 1:15 PM [employer] PT [phone]

## 2015-09-28 DIAGNOSIS — I10 Essential (primary) hypertension: Secondary | ICD-10-CM

## 2015-09-28 LAB — POCT I-STAT, CHEM 8
BUN: 21 mg/dL — ABNORMAL HIGH (ref 6–20)
CALCIUM ION: 1.32 mmol/L — AB (ref 1.12–1.23)
CHLORIDE: 103 mmol/L (ref 101–111)
CREATININE: 0.7 mg/dL (ref 0.44–1.00)
GLUCOSE: 159 mg/dL — AB (ref 65–99)
HCT: 37 % (ref 36.0–46.0)
HEMOGLOBIN: 12.6 g/dL (ref 12.0–15.0)
POTASSIUM: 3.5 mmol/L (ref 3.5–5.1)
Sodium: 142 mmol/L (ref 135–145)
TCO2: 25 mmol/L (ref 0–100)

## 2015-09-28 LAB — CBC WITH DIFFERENTIAL/PLATELET
BASOS ABS: 0 10*3/uL (ref 0.0–0.1)
Basophils Relative: 0 %
Eosinophils Absolute: 0 10*3/uL (ref 0.0–0.7)
Eosinophils Relative: 0 %
HEMATOCRIT: 35.3 % — AB (ref 36.0–46.0)
Hemoglobin: 11.4 g/dL — ABNORMAL LOW (ref 12.0–15.0)
LYMPHS PCT: 7 %
Lymphs Abs: 0.8 10*3/uL (ref 0.7–4.0)
MCH: 23.3 pg — ABNORMAL LOW (ref 26.0–34.0)
MCHC: 32.3 g/dL (ref 30.0–36.0)
MCV: 72.2 fL — AB (ref 78.0–100.0)
Monocytes Absolute: 0.7 10*3/uL (ref 0.1–1.0)
Monocytes Relative: 6 %
NEUTROS ABS: 10.6 10*3/uL — AB (ref 1.7–7.7)
NEUTROS PCT: 87 %
Platelets: 153 10*3/uL (ref 150–400)
RBC: 4.89 MIL/uL (ref 3.87–5.11)
RDW: 14.4 % (ref 11.5–15.5)
WBC: 12.1 10*3/uL — AB (ref 4.0–10.5)

## 2015-09-28 LAB — GLUCOSE, CAPILLARY
Glucose-Capillary: 97 mg/dL (ref 65–99)
Glucose-Capillary: 144 mg/dL — ABNORMAL HIGH (ref 65–99)
Glucose-Capillary: 123 mg/dL — ABNORMAL HIGH (ref 65–99)
Glucose-Capillary: 107 mg/dL — ABNORMAL HIGH (ref 65–99)

## 2015-09-28 LAB — CREATININE, SERUM
Creatinine, Ser: 0.69 mg/dL (ref 0.44–1.00)
GFR calc non Af Amer: >60 mL/min (ref >60)

## 2015-09-28 LAB — TROPONIN I
Troponin I: <0.03 ng/mL (ref <0.031)
Troponin I: 0.04 ng/mL — ABNORMAL HIGH (ref <0.031)

## 2015-09-28 LAB — NEUROMYELITIS OPTICA AUTOAB, IGG: NMO-IgG: 347.5 U/mL — ABNORMAL HIGH (ref 0.0–3.0)

## 2015-09-28 MED ORDER — ACETAMINOPHEN 325 MG PO TABS: 650.0000 mg | ORAL_TABLET | ORAL | Status: DC | PRN

## 2015-09-28 MED ORDER — NITROGLYCERIN 0.4 MG SL SUBL
0.4000 mg | SUBLINGUAL_TABLET | SUBLINGUAL | Status: DC | PRN
  Administered 2015-09-28 – 2015-10-01 (×2): 0.4 mg via SUBLINGUAL
  Filled 2015-09-28: qty 1

## 2015-09-28 MED ORDER — PREDNISONE 20 MG PO TABS
40.0000 mg | ORAL_TABLET | Freq: Every day | ORAL | Status: AC
  Administered 2015-09-28: 40 mg via ORAL
  Filled 2015-09-28: qty 2

## 2015-09-28 MED ORDER — MORPHINE SULFATE (PF) 2 MG/ML IV SOLN
1.0000 mg | INTRAVENOUS | Status: DC | PRN
  Administered 2015-09-28: 1 mg via INTRAVENOUS
  Filled 2015-09-28: qty 1

## 2015-09-28 MED ORDER — HYDROCORTISONE NA SUCCINATE PF 100 MG IJ SOLR
100.0000 mg | Freq: Once | INTRAMUSCULAR | Status: AC
  Administered 2015-09-28: 100 mg via INTRAVENOUS
  Filled 2015-09-28: qty 2

## 2015-09-28 MED ORDER — PREDNISONE 20 MG PO TABS
40.0000 mg | ORAL_TABLET | Freq: Every day | ORAL | Status: DC
  Administered 2015-09-29 – 2015-10-02 (×4): 40 mg via ORAL
  Filled 2015-09-28 (×4): qty 2

## 2015-09-28 MED ORDER — SODIUM CHLORIDE 0.9 % IV SOLN
INTRAVENOUS | Status: AC
  Administered 2015-09-28: 08:00:00 via INTRAVENOUS_CENTRAL
  Filled 2015-09-28 (×2): qty 200

## 2015-09-28 MED ORDER — SODIUM CHLORIDE 0.9 % IV BOLUS (SEPSIS)
500.0000 mL | Freq: Once | INTRAVENOUS | Status: AC
  Administered 2015-09-28: 500 mL via INTRAVENOUS

## 2015-09-28 MED ORDER — NITROGLYCERIN 0.3 MG SL SUBL
0.3000 mg | SUBLINGUAL_TABLET | SUBLINGUAL | Status: DC | PRN
  Filled 2015-09-28: qty 100

## 2015-09-28 MED ORDER — ACD FORMULA A 0.73-2.45-2.2 GM/100ML VI SOLN
500.0000 mL | Status: DC
  Filled 2015-09-28 (×2): qty 500

## 2015-09-28 MED ORDER — NITROGLYCERIN 0.4 MG SL SUBL
SUBLINGUAL_TABLET | SUBLINGUAL | Status: AC
Start: 2015-09-28 — End: 2015-09-28
  Administered 2015-09-28: 0.4 mg via SUBLINGUAL
  Filled 2015-09-28: qty 1

## 2015-09-28 MED ORDER — SODIUM CHLORIDE 0.9 % IV SOLN
2.0000 g | Freq: Once | INTRAVENOUS | Status: DC
  Filled 2015-09-28 (×2): qty 20

## 2015-09-28 MED ORDER — HEPARIN SODIUM (PORCINE) 1000 UNIT/ML IJ SOLN
1000.0000 [IU] | Freq: Once | INTRAMUSCULAR | Status: DC
  Filled 2015-09-28: qty 1

## 2015-09-28 MED ORDER — DIPHENHYDRAMINE HCL 25 MG PO CAPS: 25.0000 mg | ORAL_CAPSULE | Freq: Four times a day (QID) | ORAL | Status: DC | PRN

## 2015-09-28 NOTE — Progress Notes (Signed)
Pt for Plasmapheresis this am  Report given to dialysis RN Joy. Pt will be pick up after breakfast per RN.

## 2015-09-28 NOTE — Progress Notes (Signed)
[facility]                                                                              Patient Demographics  Andrea Berry, is a 50 y.o. female, DOB - [birth year], [id number]  Admit date - 09/21/2015   Admitting Physician Reubin Milan, MD  Outpatient Primary MD for the patient is [name], NP  LOS - 6   Chief Complaint  Patient presents with  . Extremity Weakness       Brief HPI   Pleasant 50 year old African-American female with history of CVA in the past, hypertension, GERD, anxiety, depression, lupus with legal blindness, who is chronically on prednisone presented to the hospital with chief complaints of left-sided weakness are more than leg, in the ER she was seen by neurology and workup was consistent with Devic's disease. She was started on IV Solu-Medrol along with plasma exchange and admitted to [facility] service. She has minimal improvement in her left arm strength, plan to complete her plasma exchange treatments on 09/30/2015 and thereafter likely be discharged to SNF/CIR.   Assessment & Plan   Severe left arm weakness ongoing for 3 days PTA. Underlying history of legal blindness. Secondary to Devic's disease - Neurology consulted. Patient was placed on IV Solu-Medrol total of 5 doses stop date 09/26/2015 and due for plasma exchange every other day for a total 5 runs to be finished on 09/30/2015 as directed by neurology.  - Further management of this problem defer to neurology.  - Continue supportive care with PT OT - CIR consult placed  Transient mild Hypotension with bradycardia, chest pain - During the treatment today, holding beta blocker, IV fluid bolus, EKG - Patient is on daily and is on at home, was on IV Solu-Medrol. Placed on stress dose hydrocortisone 100 mg 1, restart prednisone 40 mg daily, slowly taper to 5 mg maintenance dose. - EKG showed anterolateral T-wave inversions, similar to prior EKG on 1/16 and 1/11 - 2-D echo on  1/12 had shown EF of [id number], grade 1 diastolic dysfunction - If troponins positive, will consult cardiology   History of lupus. On prednisone at home - Patient was placed on IV Solu-Medrol, along with Imuran. Outpatient follow-up with Dr. Estanislado Pandy postdischarge. - Restart prednisone 40 mg daily, taper slowly to 5 mg maintenance dose. Stress dose 100 mg hydrocortisone 1 today   History of TIA/stroke in the past. No acute issues.   Prolonged QTC upon admission. IV magnesium, low-dose beta blocker, monitor on telemetry. Resolved on repeat EKG   Dehydration with tachycardia. Hydrated, and improved, stable echogram.   GERD. On PPI.  Leuokocytosis due to steroids. Afebrile monitor.  Low TSH. Could be sick euthyroid, repeat TSH along with T3 and free T4.   Steroid induced hyperglycemia - Place on sliding scale insulin  Code Status: Full CODE STATUS  Family Communication: Discussed in detail with the patient, all imaging results, lab results explained to the patient    Disposition Plan:   Time Spent in minutes  25 minutes  Procedures   CT head, MRI brain and C-spine consistent with Devic's disease.  Lumbar puncture not consistent  with meningitis   Right IJ venous dialysis catheter placed by IR on 09/23/2015  TTE   - Left ventricle: The cavity size was normal. Wall thickness wasincreased in a pattern of moderate to severe LVH. Systolicfunction was vigorous. The estimated ejection fraction was in the range of 65% to 70%. There was dynamic obstruction in the mid cavity, with a peak velocity of 314 cm/sec and a peak gradient of39 mm Hg. Wall motion was normal; there were no regional wall motion abnormalities. Doppler parameters are consistent with abnormal left ventricular relaxation (grade 1 diastolic dysfunction). - Left atrium: The atrium was mildly dilated. - Pericardium, extracardiac: A small pericardial effusion was identified circumferential to the heart  Consults     Neurology  DVT Prophylaxis  Lovenox  Medications  Scheduled Meds: .  stroke: mapping our early stages of recovery book   Does not apply Once  . azaTHIOprine  50 mg Oral Daily  . calcium gluconate IVPB  2 g Intravenous Once  . enoxaparin (LOVENOX) injection  40 mg Subcutaneous Q0600  . heparin  1,000 Units Intracatheter Once  . insulin aspart  0-15 Units Subcutaneous TID WC  . insulin aspart  0-5 Units Subcutaneous QHS  . multivitamin with minerals  1 tablet Oral Daily  . pantoprazole  40 mg Oral Daily  . pregabalin  75 mg Oral TID  . sodium chloride  500 mL Intravenous Once  . sodium chloride  3 mL Intravenous Q12H  . sodium chloride  3 mL Intravenous Q12H   Continuous Infusions: . citrate dextrose     PRN Meds:.sodium chloride, acetaminophen, diphenhydrAMINE, LORazepam, ondansetron **OR** ondansetron (ZOFRAN) IV, oxyCODONE, senna-docusate, sodium chloride   Antibiotics   Anti-infectives    Start     Dose/Rate Route Frequency Ordered Stop   09/23/15 0815  ceFAZolin (ANCEF) IVPB 2 g/50 mL premix     2 g 100 mL/hr over 30 Minutes Intravenous  Once 09/23/15 0814 09/23/15 [id number]        Subjective:   Andrea Berry was seen and examined today.  Persisting left upper arm weakness, chest pain during plasmapheresis. Patient  abdominal pain, N/V/D/C, new weakness, numbess, tingling.  Objective:   Blood pressure 124/63, pulse 66, temperature 98.2 F (36.8 C), temperature source Oral, resp. rate 23, height 5\' 1"  (1.549 m), weight 81 kg (178 lb 9.2 oz), SpO2 91 %.  Wt Readings from Last 3 Encounters:  09/28/15 81 kg (178 lb 9.2 oz)  02/08/14 73.199 kg (161 lb 6 oz)  01/29/14 75.297 kg (166 lb)     Intake/Output Summary (Last 24 hours) at 09/28/15 1111 Last data filed at 09/28/15 0957  Gross per 24 hour  Intake   1060 ml  Output      0 ml  Net   1060 ml    Exam  General: Alert and oriented x 3, NAD  HEENT:  PERRLA, EOMI, Anicteric Sclera, mucous membranes moist.    Neck: Supple, no JVD, no masses  CVS: S1 S2 auscultated, no rubs, murmurs or gallops. Regular rate and rhythm.  Respiratory: Clear to auscultation bilaterally, no wheezing, rales or rhonchi  Abdomen: Soft, nontender, nondistended, + bowel sounds  Ext: no cyanosis clubbing or edema  Neuro: left arm strength 1/5, left leg 4/5, legally blind  Skin: No rashes  Psych: Normal affect and demeanor, alert and oriented x3    Data Review   Micro Results Recent Results (from the past 240 hour(s))  CSF culture     Status: None  Collection Time: 09/22/15  8:57 AM  Result Value Ref Range Status   Specimen Description CSF TUBE 2  Final   Special Requests NONE  Final   Gram Stain   Final    WBC PRESENT,BOTH PMN AND MONONUCLEAR NO ORGANISMS SEEN CYTOSPIN SMEAR    Culture NO GROWTH 3 DAYS  Final   Report Status 09/25/2015 FINAL  Final    Radiology Reports Dg Chest 2 View  09/22/2015  CLINICAL DATA:  TIA. EXAM: CHEST  2 VIEW COMPARISON:  04/29/2012 FINDINGS: Lung volumes are low. Cardiomegaly is unchanged allowing for differences in technique. Minimal bibasilar subsegmental atelectasis no pulmonary edema, consolidation, pleural effusion or pneumothorax. No acute osseous abnormalities are seen. IMPRESSION: Low lung volumes with stable cardiomegaly. Electronically Signed   By: Jeb Levering M.D.   On: 09/22/2015 03:36   Ct Head Wo Contrast  09/21/2015  CLINICAL DATA:  Acute left-sided weakness. EXAM: CT HEAD WITHOUT CONTRAST TECHNIQUE: Contiguous axial images were obtained from the base of the skull through the vertex without intravenous contrast. COMPARISON:  CT scan of June 24, 2014. FINDINGS: Bony calvarium appears intact. No mass effect or midline shift is noted. Ventricular size is within normal limits. There is no evidence of mass lesion, hemorrhage or acute infarction. IMPRESSION: Normal head CT. Electronically Signed   By: Marijo Conception, M.D.   On: 09/21/2015 16:27   Mr Brain  Wo Contrast  09/21/2015  CLINICAL DATA:  Left arm numbness and pain. Neck pain. Stroke versus cervical spine disease. EXAM: MRI HEAD WITHOUT CONTRAST MRI CERVICAL SPINE WITHOUT CONTRAST TECHNIQUE: Multiplanar, multiecho pulse sequences of the brain and surrounding structures, and cervical spine, to include the craniocervical junction and cervicothoracic junction, were obtained without intravenous contrast. COMPARISON:  Head CT earlier same day.  MRI 06/24/2014 FINDINGS: MRI HEAD FINDINGS There is a 1 cm region of T2 and FLAIR abnormality within the left thalamus. No restricted diffusion presently. On the old MRI, there appear to be an old small vessel insult in this location. Elsewhere, the cerebral hemispheres show a few punctate foci of T2 and FLAIR signal in the deep white matter. No cortical or large vessel territory insult. No mass lesion, hemorrhage, hydrocephalus or extra-axial collection. No pituitary mass. No inflammatory sinus disease. No skull or skullbase lesion. MRI CERVICAL SPINE FINDINGS The cervical spinal cord is diffusely abnormal showing enlargement and inter medullary T2 signal. This extends from the obex all the way to the C7 level. This is consistent with acute deep mild is a shin or myelitis. There are no degenerative changes. There is no compressive pathology. IMPRESSION: 1 cm region of abnormal T2 and FLAIR signal within the left thalamus not consistent with acute infarction. This could be an acute focus of demyelination or conceivably but less likely a late subacute infarction. Diffusely abnormal cervical spinal cord which is swollen and shows diffuse abnormal T2 signal. This is likely to represent acute demyelination/neuromyelitis optica (Devic's disease) or acute transverse myelitis. Quite likely that the findings of the left thalamus in the spinal cord are due to the same pathology. Electronically Signed   By: Nelson Chimes M.D.   On: 09/21/2015 20:41   Mr Jeri Cos Contrast  09/22/2015   CLINICAL DATA:  50 yo female with his history of bilateral optic neuritis now with longitudinally extensive transverse myelitis. Weakness of arms and legs. EXAM: MRI CERVICAL SPINE WITH CONTRAST; MRI HEAD WITH CONTRAST TECHNIQUE: Multiplanar and multiecho pulse sequences of the cervical spine, to include the  craniocervical junction and cervicothoracic junction, were obtained according to standard protocol with intravenous contrast.; Multiplanar, multiecho pulse sequences of the brain and surrounding structures were obtained according to standard protocol with intravenous contrast CONTRAST:  21mL MULTIHANCE GADOBENATE DIMEGLUMINE 529 MG/ML IV SOLN COMPARISON:  Noncontrast brain and cervical spine from 07/21/2016. FINDINGS: Post infusion images of the brain demonstrates no abnormal enhancement. LEFT thalamic lesion, T2 and FLAIR hyperintense, probably represents an atypical area of demyelination, but non acute, given the lack of restricted diffusion and absence of blood brain barrier breakdown. In the cervical spine, there is extensive postcontrast enhancement, corresponding to the T2 and FLAIR hyperintensity on yesterday's exam, extending over multiple cervical segments, from the foramen magnum through C7. This enhancement involves both gray and white matter. There is greater involvement of the LEFT hemicord. In the setting of a patient with history of optic neuritis, the findings are consistent with Devic's disease. No features specific for optic neuritis can be observed on post infusion imaging through the brain, non dedicated orbit study. IMPRESSION: No abnormal intracranial enhancement is documented. Heterogeneous enhancement of the enlarged cervical cord segments, from foramen magnum through C7, involving both gray and white matter, consistent with Devic's disease. Electronically Signed   By: Staci Righter M.D.   On: 09/22/2015 12:39   Mr Cervical Spine Wo Contrast  09/21/2015  CLINICAL DATA:  Left arm  numbness and pain. Neck pain. Stroke versus cervical spine disease. EXAM: MRI HEAD WITHOUT CONTRAST MRI CERVICAL SPINE WITHOUT CONTRAST TECHNIQUE: Multiplanar, multiecho pulse sequences of the brain and surrounding structures, and cervical spine, to include the craniocervical junction and cervicothoracic junction, were obtained without intravenous contrast. COMPARISON:  Head CT earlier same day.  MRI 06/24/2014 FINDINGS: MRI HEAD FINDINGS There is a 1 cm region of T2 and FLAIR abnormality within the left thalamus. No restricted diffusion presently. On the old MRI, there appear to be an old small vessel insult in this location. Elsewhere, the cerebral hemispheres show a few punctate foci of T2 and FLAIR signal in the deep white matter. No cortical or large vessel territory insult. No mass lesion, hemorrhage, hydrocephalus or extra-axial collection. No pituitary mass. No inflammatory sinus disease. No skull or skullbase lesion. MRI CERVICAL SPINE FINDINGS The cervical spinal cord is diffusely abnormal showing enlargement and inter medullary T2 signal. This extends from the obex all the way to the C7 level. This is consistent with acute deep mild is a shin or myelitis. There are no degenerative changes. There is no compressive pathology. IMPRESSION: 1 cm region of abnormal T2 and FLAIR signal within the left thalamus not consistent with acute infarction. This could be an acute focus of demyelination or conceivably but less likely a late subacute infarction. Diffusely abnormal cervical spinal cord which is swollen and shows diffuse abnormal T2 signal. This is likely to represent acute demyelination/neuromyelitis optica (Devic's disease) or acute transverse myelitis. Quite likely that the findings of the left thalamus in the spinal cord are due to the same pathology. Electronically Signed   By: Nelson Chimes M.D.   On: 09/21/2015 20:41   Mr Cervical Spine W Contrast  09/22/2015  CLINICAL DATA:  50 yo female with his  history of bilateral optic neuritis now with longitudinally extensive transverse myelitis. Weakness of arms and legs. EXAM: MRI CERVICAL SPINE WITH CONTRAST; MRI HEAD WITH CONTRAST TECHNIQUE: Multiplanar and multiecho pulse sequences of the cervical spine, to include the craniocervical junction and cervicothoracic junction, were obtained according to standard protocol with intravenous contrast.;  Multiplanar, multiecho pulse sequences of the brain and surrounding structures were obtained according to standard protocol with intravenous contrast CONTRAST:  71mL MULTIHANCE GADOBENATE DIMEGLUMINE 529 MG/ML IV SOLN COMPARISON:  Noncontrast brain and cervical spine from 07/21/2016. FINDINGS: Post infusion images of the brain demonstrates no abnormal enhancement. LEFT thalamic lesion, T2 and FLAIR hyperintense, probably represents an atypical area of demyelination, but non acute, given the lack of restricted diffusion and absence of blood brain barrier breakdown. In the cervical spine, there is extensive postcontrast enhancement, corresponding to the T2 and FLAIR hyperintensity on yesterday's exam, extending over multiple cervical segments, from the foramen magnum through C7. This enhancement involves both gray and white matter. There is greater involvement of the LEFT hemicord. In the setting of a patient with history of optic neuritis, the findings are consistent with Devic's disease. No features specific for optic neuritis can be observed on post infusion imaging through the brain, non dedicated orbit study. IMPRESSION: No abnormal intracranial enhancement is documented. Heterogeneous enhancement of the enlarged cervical cord segments, from foramen magnum through C7, involving both gray and white matter, consistent with Devic's disease. Electronically Signed   By: Staci Righter M.D.   On: 09/22/2015 12:39   Ir Fluoro Guide Cv Line Right  09/23/2015  CLINICAL DATA:  Transverse myelitis.  Plasmapheresis. EXAM: TUNNELED  DIALYSIS CATHETER PLACEMENT, ULTRASOUND GUIDANCE FOR VASCULAR ACCESS FLUOROSCOPY TIME:  24 seconds MEDICATIONS AND MEDICAL HISTORY: Versed 1.5 mg, Fentanyl 75 mcg. Additional Medications: As antibiotic prophylaxis, Ancef was ordered pre-procedure and administered intravenously within one hour of incision. ANESTHESIA/SEDATION: Moderate sedation time: 20 minutes CONTRAST:  None PROCEDURE: The procedure, risks, benefits, and alternatives were explained to the patient. Questions regarding the procedure were encouraged and answered. The patient understands and consents to the procedure. The right neck was prepped with ChloraPrep in a sterile fashion, and a sterile drape was applied covering the operative field. A sterile gown and sterile gloves were used for the procedure. 1% lidocaine into the skin and subcutaneous tissue. The right jugular vein was noted to be patent initially with ultrasound. Under sonographic guidance, a micropuncture needle was inserted into the right IJ vein (Ultrasound and fluoroscopic image documentation was performed). It was removed over an 018 wire which was upsized to an Amplatz. This was advanced into the IVC. A small incision was made in the right upper chest. The tunneling device was utilized to advance the 23 centimeter tip to cuff catheter from the chest incision and out the neck incision. A peel-away sheath was advanced over the Amplatz wire. The leading edge of the catheter was then advanced through the peel-away sheath. The peel-away sheath was removed. It was flushed and instilled with heparin. The chest incision was closed with a 0 Prolene pursestring stitch. The neck incision was closed with a 4-0 Vicryl subcuticular stitch. COMPLICATIONS: None FINDINGS: The image demonstrates placement of a tunneled dialysis catheter with its tip in the right atrium. IMPRESSION: Successful right IJ vein tunneled dialysis catheter with its tip in the right atrium. Electronically Signed   By: Marybelle Killings M.D.   On: 09/23/2015 12:33   Ir US Guide Vasc Access Right  09/23/2015  CLINICAL DATA:  Transverse myelitis.  Plasmapheresis. EXAM: TUNNELED DIALYSIS CATHETER PLACEMENT, ULTRASOUND GUIDANCE FOR VASCULAR ACCESS FLUOROSCOPY TIME:  24 seconds MEDICATIONS AND MEDICAL HISTORY: Versed 1.5 mg, Fentanyl 75 mcg. Additional Medications: As antibiotic prophylaxis, Ancef was ordered pre-procedure and administered intravenously within one hour of incision. ANESTHESIA/SEDATION: Moderate sedation time: 20 minutes  CONTRAST:  None PROCEDURE: The procedure, risks, benefits, and alternatives were explained to the patient. Questions regarding the procedure were encouraged and answered. The patient understands and consents to the procedure. The right neck was prepped with ChloraPrep in a sterile fashion, and a sterile drape was applied covering the operative field. A sterile gown and sterile gloves were used for the procedure. 1% lidocaine into the skin and subcutaneous tissue. The right jugular vein was noted to be patent initially with ultrasound. Under sonographic guidance, a micropuncture needle was inserted into the right IJ vein (Ultrasound and fluoroscopic image documentation was performed). It was removed over an 018 wire which was upsized to an Amplatz. This was advanced into the IVC. A small incision was made in the right upper chest. The tunneling device was utilized to advance the 23 centimeter tip to cuff catheter from the chest incision and out the neck incision. A peel-away sheath was advanced over the Amplatz wire. The leading edge of the catheter was then advanced through the peel-away sheath. The peel-away sheath was removed. It was flushed and instilled with heparin. The chest incision was closed with a 0 Prolene pursestring stitch. The neck incision was closed with a 4-0 Vicryl subcuticular stitch. COMPLICATIONS: None FINDINGS: The image demonstrates placement of a tunneled dialysis catheter with its tip in  the right atrium. IMPRESSION: Successful right IJ vein tunneled dialysis catheter with its tip in the right atrium. Electronically Signed   By: Marybelle Killings M.D.   On: 09/23/2015 12:33   Dg Fluoro Guide Lumbar Puncture  09/22/2015  CLINICAL DATA:  Transverse myelitis. EXAM: DIAGNOSTIC LUMBAR PUNCTURE UNDER FLUOROSCOPIC GUIDANCE FLUOROSCOPY TIME:  Fluoroscopy Time (in minutes and seconds): 0 minutes, 37 seconds Number of Acquired Images:  0 PROCEDURE: I discussed the risks (including hemorrhage, infection, headache, and nerve damage, among others), benefits, and alternatives to fluoroscopically guided lumbar puncture with the patient. We specifically discussed the high technical likelihood of success of the procedure. The patient understood and elected to undergo the procedure. Standard time-out was employed. Following sterile skin prep and local anesthetic administration consisting of 1 percent lidocaine, a 22 gauge spinal needle was advanced without difficulty into the thecal sac at the at the L4-5 level. Clear CSF was returned. Opening pressure was 17 cm of water. 13 cc of clear CSF was collected. The needle was subsequently removed and the skin cleansed and bandaged. No immediate complications were observed. IMPRESSION: 1. Technically successful fluoroscopically guided lumbar puncture at the L4-5 level. No complicating feature observed. Opening pressure 17 cm of water. Electronically Signed   By: Van Clines M.D.   On: 09/22/2015 09:15    CBC  Recent Labs Lab 09/24/15 0415  09/25/15 0531 09/26/15 0401 09/26/15 1258 09/27/15 0710 09/28/15 0430 09/28/15 1002  WBC 14.7*  --  14.2* 12.5*  --  10.6* 12.1*  --   HGB 11.9*  < > 11.8* 11.8* 12.6 12.0 11.4* 12.6  HCT 37.6  < > 36.8 36.4 37.0 37.4 35.3* 37.0  PLT 203  --  145* 146*  --  144* 153  --   MCV 73.0*  --  72.4* 71.9*  --  72.1* 72.2*  --   MCH 23.1*  --  23.2* 23.3*  --  23.1* 23.3*  --   MCHC 31.6  --  32.1 32.4  --  32.1 32.3  --    RDW 14.8  --  14.9 14.8  --  14.4 14.4  --   LYMPHSABS 0.6*  --  0.7 0.7  --  0.6* 0.8  --   MONOABS 0.3  --  0.3 0.2  --  0.2 0.7  --   EOSABS 0.0  --  0.0 0.0  --  0.0 0.0  --   BASOSABS 0.0  --  0.0 0.0  --  0.0 0.0  --   < > = values in this interval not displayed.  Chemistries   Recent Labs Lab 09/21/15 1422  09/22/15 0351 09/23/15 0433 09/23/15 1553 09/24/15 0415 09/24/15 1600 09/26/15 1258 09/28/15 0430 09/28/15 1002  NA 135  < > 137 139 140 141 143 142  --  142  K 4.7  < > 3.7 5.0 4.3 4.7 4.5 4.0  --  3.5  CL 104  < > 107 110 109 117* 110 106  --  103  CO2 19*  --  20* 21*  --  19*  --   --   --   --   GLUCOSE 91  < > 135* 124* 202* 132* 126* 160*  --  159*  BUN 12  < > 9 9 12 13 17 19   --  21*  CREATININE 0.80  < > 0.77 0.69 0.60 0.69 0.70 0.60 0.69 0.70  CALCIUM 10.1  --  9.2 9.6  --  8.8*  --   --   --   --   MG  --   --  1.8  --   --   --   --   --   --   --   AST 63*  --  45* 50*  --  21  --   --   --   --   ALT 48  --  40 48  --  18  --   --   --   --   ALKPHOS 54  --  50 54  --  22*  --   --   --   --   BILITOT 1.2  --  1.8* 0.8  --  0.5  --   --   --   --   < > = values in this interval not displayed. ------------------------------------------------------------------------------------------------------------------ estimated creatinine clearance is 83.9 mL/min (by C-G formula based on Cr of 0.7). ------------------------------------------------------------------------------------------------------------------ No results for input(s): HGBA1C in the last 72 hours. ------------------------------------------------------------------------------------------------------------------ No results for input(s): CHOL, HDL, LDLCALC, TRIG, CHOLHDL, LDLDIRECT in the last 72 hours. ------------------------------------------------------------------------------------------------------------------  Recent Labs  09/27/15 1340  TSH 0.273*    ------------------------------------------------------------------------------------------------------------------ No results for input(s): VITAMINB12, FOLATE, FERRITIN, TIBC, IRON, RETICCTPCT in the last 72 hours.  Coagulation profile  Recent Labs Lab 09/23/15 0433 09/26/15 0850  INR 1.07 1.36    No results for input(s): DDIMER in the last 72 hours.  Cardiac Enzymes No results for input(s): CKMB, TROPONINI, MYOGLOBIN in the last 168 hours.  Invalid input(s): CK ------------------------------------------------------------------------------------------------------------------ Invalid input(s): [place]  09/26/15 2201 09/27/15 0649 09/27/15 1138 09/27/15 1657 09/27/15 2215 09/28/15 0651  GLUCAP 111* 124* 113* 133* 168* 77     Bruchy Mikel M.D. [facility] 09/28/2015, 11:11 AM  Pager: [phone] Between 7am to 7pm - call Pager - [phone]  After 7pm go to [email or web address] - password [email or web address]  Call night coverage person covering after 7pm

## 2015-09-28 NOTE — Progress Notes (Signed)
Occupational Therapy Treatment Patient Details Name: Andrea Berry MRN: [id number] DOB: 18-Jan-1966 Today's Date: 09/28/2015    History of present illness 50 yo female initially presenting with Lt UE weakness and numbness. 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 PMH: legally blind, hypertension, lupus, depression, CVA 2008.   OT comments  Pt making slow but steady progress towards OT goals. Completed AAROM and PROM exercises for LUE this session and completed 2 stand-pivot transfers requiring mod +2 assist. Pt continues to need verbal and tactile cues for hip extension to come to full standing position and for proper step sequence for transfer. Spoke with pt's son [name]) and he reports that he will provide 24/7 assistance if needed, but he is actively seeking employment so unsure how long he will be able to provide needed level of care. Will continue to follow acutely.   Follow Up Recommendations  CIR;Supervision/Assistance - 24 hour    Equipment Recommendations  3 in 1 bedside comode    Recommendations for Other Services      Precautions / Restrictions Precautions Precautions: Fall;Other (comment) Precaution Comments: Legally blind Restrictions Weight Bearing Restrictions: No       Mobility Bed Mobility Overal bed mobility: Needs Assistance Bed Mobility: Supine to Sit;Sit to Supine Rolling: Min assist;Mod assist   Supine to sit: Min assist;HOB elevated Sit to supine: Mod assist   General bed mobility comments: Min assist to scoot hips to EOB. Mod assist (with son assisting as well) to progress LEs onto bed to return to supine - HOB flat and use of bedrails  Transfers Overall transfer level: Needs assistance Equipment used: 2 person hand held assist Transfers: Sit to/from Stand Sit to Stand: Mod assist;+2 physical assistance Stand pivot transfers: Mod assist;+2 physical assistance       General transfer comment: Mod +2 assist for boost stand and to encourage hip extension to  come to full standing position. Verbal cues for step sequence and mod +2 assist to maintain balance during pivotal steps.    Balance Overall balance assessment: Needs assistance Sitting-balance support: Single extremity supported;Feet supported Sitting balance-Leahy Scale: Poor Sitting balance - Comments: Required UE support to maintain balance sitting EOB Postural control: Posterior lean Standing balance support: Single extremity supported;During functional activity Standing balance-Leahy Scale: Poor Standing balance comment: Requires RUE support to maintain upright position - leaning on therapist for additional support                   ADL Overall ADL's : Needs assistance/impaired                         Toilet Transfer: Moderate assistance;+2 for physical assistance;Cueing for sequencing;Stand-pivot;BSC   Toileting- Clothing Manipulation and Hygiene: Maximal assistance;Cueing for safety;Sit to/from stand Toileting - Clothing Manipulation Details (indicate cue type and reason): Cues for hip extension     Functional mobility during ADLs: Moderate assistance;+2 for physical assistance General ADL Comments: Completed LUE ROM exercises and reviewed extremity protection strategies during ADLs.        Vision                 Additional Comments: Legally blind - can see minimally out of L eye   Perception     Praxis      Cognition   Behavior During Therapy: Flat affect Overall Cognitive Status: Within Functional Limits for tasks assessed  Extremity/Trunk Assessment               Exercises General Exercises - Upper Extremity Shoulder Flexion: AAROM;Self ROM;Left;5 reps;Seated (only to 90 degrees) Shoulder ABduction: AAROM;Self ROM;5 reps;Seated;Left (only to 90 degrees) Shoulder ADduction: AAROM;Self ROM;Left;5 reps;Seated Elbow Flexion: AAROM;Self ROM;Left;5 reps;Supine Elbow Extension: AAROM;Self ROM;Left;5  reps;Supine Wrist Flexion: AAROM;Self ROM;Left;5 reps;Supine Wrist Extension: AAROM;Self ROM;Left;5 reps;Supine Digit Composite Flexion: AAROM;Self ROM;Left;5 reps;Supine Composite Extension: AAROM;Self ROM;Left;5 reps;Supine   Shoulder Instructions       General Comments      Pertinent Vitals/ Pain       Pain Assessment: Faces Faces Pain Scale: Hurts little more Pain Location: LUE  Pain Descriptors / Indicators: Aching;Numbness Pain Intervention(s): Limited activity within patient's tolerance;Monitored during session;Repositioned  Home Living                                          Prior Functioning/Environment              Frequency Min 2X/week     Progress Toward Goals  OT Goals(current goals can now be found in the care plan section)  Progress towards OT goals: Progressing toward goals  Acute Rehab OT Goals Patient Stated Goal: return to being independent OT Goal Formulation: With patient Time For Goal Achievement: 10/07/15 Potential to Achieve Goals: Good ADL Goals Pt Will Perform Grooming: with set-up;sitting Pt Will Perform Upper Body Bathing: with set-up;with supervision;sitting Pt Will Perform Lower Body Bathing: with min guard assist;sit to/from stand Pt Will Transfer to Toilet: with min assist;bedside commode;stand pivot transfer Pt Will Perform Toileting - Clothing Manipulation and hygiene: with min assist;sit to/from stand Additional ADL Goal #1: Use LUE as funcitonal assist for ADL  Plan Discharge plan remains appropriate    Co-evaluation                 End of Session Equipment Utilized During Treatment: Gait belt   Activity Tolerance Patient tolerated treatment well   Patient Left in bed;with call bell/phone within reach;with bed alarm set;with family/visitor present   Nurse Communication Mobility status;Other (comment) (Keep 2 pillows under LUE at all times)        Time: [id number] OT Time Calculation (min): 35  min  Charges: OT General Charges $OT Visit: 1 Procedure OT Treatments $Self Care/Home Management : 23-37 mins  Redmond Baseman, OTR/L Pager: [phone] 09/28/2015, 3:31 PM

## 2015-09-28 NOTE — Progress Notes (Signed)
I have left two messages for pt's son, [name], to contact me to discuss what help he can provide pt once she discharges home but have not received a call back.  I placed second call to son, Legrand Como, but he has no voicemail. Third call I have today placed to pt's sister, Lattie Haw, to discuss caregiver support at home and rehab venue options. I await family/pt clarification of caregiver support before I can pursue inpt rehab admission with pt's [facility] insurance. I would recommend SNF rehab as backup plan as needed. I will discuss with RN CM and SW. [phone]

## 2015-09-28 NOTE — Progress Notes (Signed)
I was contacted by OT  that pt's son at bedside. I stated that I would be there within the hour to see him. I arrived and pt states [name] had left to get food. I left my number at bedside for [name] to contact me upon his return to her room. I verified with pt that I had Trey's correct phone number which I did. I continue to seek clarification of caregiver support long term before pursuing insurance approval for a possible inpt rehab admission. [id number]

## 2015-09-28 NOTE — Progress Notes (Signed)
I met with pt and sons [name] and Legrand Como at bedside to discuss rehab venue options. [name] is the primary contact for Legrand Como has issues per pt which make him unable to handle the decision making issues needed. Pt's sister, Lattie Haw unable to provide assist due to her own medical issues. Pt lives with both sons but they are unable to provide 24/7 physical assist. [name] and his mother are requesting a second opinion of her medical work up and treatment, preferably at another hospital for they have not seen improvement in her condition. They also request SNF rehab at this time once medical workup and treatment is complete. Pt prefers [facility] if possible. I will update RN CM and SW of plans. We will sign off at this time. Please call me for any questions. [phone]

## 2015-09-29 LAB — CBC WITH DIFFERENTIAL/PLATELET
BASOS ABS: 0 10*3/uL (ref 0.0–0.1)
Basophils Relative: 0 %
EOS ABS: 0 10*3/uL (ref 0.0–0.7)
Eosinophils Relative: 0 %
HEMATOCRIT: 33.8 % — AB (ref 36.0–46.0)
Hemoglobin: 11 g/dL — ABNORMAL LOW (ref 12.0–15.0)
LYMPHS ABS: 1.4 10*3/uL (ref 0.7–4.0)
Lymphocytes Relative: 14 %
MCH: 23.6 pg — ABNORMAL LOW (ref 26.0–34.0)
MCHC: 32.5 g/dL (ref 30.0–36.0)
MCV: 72.4 fL — ABNORMAL LOW (ref 78.0–100.0)
MONO ABS: 0.8 10*3/uL (ref 0.1–1.0)
MONOS PCT: 8 %
Neutro Abs: 7.9 10*3/uL — ABNORMAL HIGH (ref 1.7–7.7)
Neutrophils Relative %: 78 %
PLATELETS: 120 10*3/uL — AB (ref 150–400)
RBC: 4.67 MIL/uL (ref 3.87–5.11)
RDW: 14.5 % (ref 11.5–15.5)
WBC: 10.1 10*3/uL (ref 4.0–10.5)

## 2015-09-29 LAB — GLUCOSE, CAPILLARY
GLUCOSE-CAPILLARY: 172 mg/dL — AB (ref 65–99)
GLUCOSE-CAPILLARY: 117 mg/dL — AB (ref 65–99)
Glucose-Capillary: 106 mg/dL — ABNORMAL HIGH (ref 65–99)
Glucose-Capillary: 119 mg/dL — ABNORMAL HIGH (ref 65–99)

## 2015-09-29 LAB — TROPONIN I

## 2015-09-29 MED ORDER — BISACODYL 10 MG RE SUPP
10.0000 mg | Freq: Every day | RECTAL | Status: DC | PRN
  Administered 2015-09-29 – 2015-10-05 (×3): 10 mg via RECTAL
  Filled 2015-09-29 (×3): qty 1

## 2015-09-29 NOTE — Progress Notes (Signed)
Physical Therapy Treatment Patient Details Name: Andrea Berry MRN: [id number] DOB: [birth year] Today's Date: 09/29/2015    History of Present Illness 50 yo female initially presenting with Lt UE weakness and numbness. 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 PMH: legally blind, hypertension, lupus, depression, CVA 2008.    PT Comments    Pt was able to perform transfers this session with up to +2 mod assistance. Pt continues to be very flat and does not appear fully engaged in therapy sessions, however follows commands well. Noted that family now prefers SNF level rehab at d/c, and CIR has signed off. Discharge plan was updated to reflect this. Will continue to follow and progress as able per POC.   Follow Up Recommendations  SNF;Supervision/Assistance - 24 hour     Equipment Recommendations  Other (comment) (TBD by next venue of care)    Recommendations for Other Services       Precautions / Restrictions Precautions Precautions: Fall;Other (comment) Precaution Comments: Legally blind Restrictions Weight Bearing Restrictions: No    Mobility  Bed Mobility Overal bed mobility: Needs Assistance Bed Mobility: Rolling;Sidelying to Sit Rolling: Min assist Sidelying to sit: Min assist;+2 for physical assistance       General bed mobility comments: Pt with good initiation of transfer. Hand-over-hand assist for pt to reach for L bed rail with RUE. Pt with good grip on bed rail and was able to scoot herself close to EOB. Bed pad used for scooting assistance. Pt was able to elevate trunk to full sitting position with min A for support.   Transfers Overall transfer level: Needs assistance Equipment used: 2 person hand held assist Transfers: Sit to/from [facility] Sit to Stand: Mod assist;+2 physical assistance Stand pivot transfers: Mod assist;+2 physical assistance       General transfer comment: Mod +2 assist for boost stand and to encourage hip extension to come to  full standing position. Verbal cues for step sequence and mod +2 assist to maintain balance during pivotal steps. Pt transitioned from bed>BSC>recliner. Signficant L knee buckling and hip drop noted during weight bearing.   Ambulation/Gait                 Stairs            Wheelchair Mobility    Modified Rankin (Stroke Patients Only)       Balance Overall balance assessment: Needs assistance Sitting-balance support: Feet supported;No upper extremity supported Sitting balance-Leahy Scale: Poor Sitting balance - Comments: Required UE support to maintain balance sitting EOB Postural control: Posterior lean Standing balance support: Single extremity supported;During functional activity Standing balance-Leahy Scale: Poor                      Cognition Arousal/Alertness: Awake/alert Behavior During Therapy: Flat affect Overall Cognitive Status: Within Functional Limits for tasks assessed                      Exercises General Exercises - Lower Extremity Long Arc Quad: 10 reps;Both    General Comments        Pertinent Vitals/Pain Pain Assessment: Faces Faces Pain Scale: Hurts little more Pain Location: LUE Pain Descriptors / Indicators: Discomfort;Aching;Numbness Pain Intervention(s): Limited activity within patient's tolerance;Monitored during session;Repositioned    Home Living                      Prior Function  PT Goals (current goals can now be found in the care plan section) Acute Rehab PT Goals Patient Stated Goal: return to being independent PT Goal Formulation: With patient Time For Goal Achievement: 10/07/15 Potential to Achieve Goals: Fair Progress towards PT goals: Progressing toward goals    Frequency  Min 3X/week    PT Plan Discharge plan needs to be updated    Co-evaluation             End of Session Equipment Utilized During Treatment: Gait belt Activity Tolerance: Patient limited by  fatigue Patient left: in chair;with call bell/phone within reach;with chair alarm set     Time: [phone] PT Time Calculation (min) (ACUTE ONLY): 19 min  Charges:  $Therapeutic Activity: 8-22 mins                    G Codes:      Rolinda Roan 10/12/15, 8:41 AM   Rolinda Roan, PT, DPT Acute Rehabilitation Services Pager: [phone]

## 2015-09-29 NOTE — Care Management Important Message (Signed)
Important Message  Patient Details  Name: Andrea Berry MRN: [id number] Date of Birth: [birth year]   Medicare Important Message Given:  Yes    Wilberto Console P Arash Karstens 09/29/2015, 12:22 PM

## 2015-09-29 NOTE — NC FL2 (Deleted)
[place] LEVEL OF CARE SCREENING TOOL     IDENTIFICATION  Patient Name: Andrea Berry Birthdate: June 15, 1966 Sex: female Admission Date (Current Location): 09/21/2015  [facility] and [place] Number:  Herbalist and Address:  The [place]. [facility], [place] [address] [place], [place] [address]      Provider Number: [id number]  Attending Physician Name and Address:  Mendel Corning, MD  Relative Name and Phone Number:       Current Level of Care: Hospital Recommended Level of Care: [place] Prior Approval Number:    Date Approved/Denied:   PASRR Number:    Discharge Plan: SNF    Current Diagnoses: Patient Active Problem List   Diagnosis Date Noted  . History of CVA (cerebrovascular accident)   . Leukocytosis   . Thrombocytopenia ([place])   . Transverse myelitis ([place]) 09/22/2015  . Cervical radiculopathy   . Devic's disease ([place])   . SLE (systemic lupus erythematosus) ([place]) 09/21/2015  . Hypertension 09/21/2015  . Depression 09/21/2015  . Blind 01/30/2014  . Multiple thyroid nodules 03/20/2013  . GERD (gastroesophageal reflux disease) 03/20/2013    Orientation RESPIRATION BLADDER Height & Weight    Self, Time, Situation, Place  Normal Continent 5\' 1"  (154.9 cm) 178 lbs.  BEHAVIORAL SYMPTOMS/MOOD NEUROLOGICAL BOWEL NUTRITION STATUS      Continent Diet (renal diet with carb modified)  AMBULATORY STATUS COMMUNICATION OF NEEDS Skin   Limited Assist Verbally Normal                       Personal Care Assistance Level of Assistance  Bathing, Dressing Bathing Assistance: Limited assistance         Functional Limitations Info  Sight          SPECIAL CARE FACTORS FREQUENCY  PT (By licensed PT), OT (By licensed OT)     PT Frequency: 5 OT Frequency: 5            Contractures      Additional Factors Info  Code Status, Allergies Code Status Info: Full Code Allergies Info: NKDA            Current Medications (09/29/2015):  This is the current hospital active medication list Current Facility-Administered Medications  Medication Dose Route Frequency Provider Last Rate Last Dose  .  stroke: mapping our early stages of recovery book   Does not apply Once Reubin Milan, MD      . 0.9 %  sodium chloride infusion  250 mL Intravenous PRN Reubin Milan, MD      . acetaminophen (TYLENOL) tablet 650 mg  650 mg Oral Q4H PRN Greta Doom, MD      . azaTHIOprine [employer]) tablet 50 mg  50 mg Oral Daily Reubin Milan, MD   50 mg at 09/29/15 0906  . calcium gluconate 2 g in sodium chloride 0.9 % 250 mL IVPB  2 g Intravenous Once Greta Doom, MD      . citrate dextrose (ACD-A anticoagulant) solution 500 mL  500 mL Intravenous Continuous Greta Doom, MD      . diphenhydrAMINE (BENADRYL) capsule 25 mg  25 mg Oral Q6H PRN Greta Doom, MD      . enoxaparin (LOVENOX) injection 40 mg  40 mg Subcutaneous Q0600 Greta Doom, MD   40 mg at 09/29/15 0552  . heparin injection 1,000 Units  1,000 Units Intracatheter Once Greta Doom, MD      .  insulin aspart (novoLOG) injection 0-15 Units  0-15 Units Subcutaneous TID WC Thurnell Lose, MD   3 Units at 09/29/15 0740  . insulin aspart (novoLOG) injection 0-5 Units  0-5 Units Subcutaneous QHS Thurnell Lose, MD   2 Units at 09/25/15 2317  . LORazepam (ATIVAN) tablet 0.5 mg  0.5 mg Oral Q4H PRN Reubin Milan, MD   0.5 mg at 09/29/15 1351  . morphine 2 MG/ML injection 1 mg  1 mg Intravenous Q3H PRN Ripudeep Krystal Eaton, MD   1 mg at 09/28/15 1231  . multivitamin with minerals tablet 1 tablet  1 tablet Oral Daily Reubin Milan, MD   1 tablet at 09/29/15 0906  . nitroGLYCERIN (NITROSTAT) SL tablet 0.4 mg  0.4 mg Sublingual Q5 min PRN Skeet Simmer, RPH   0.4 mg at 09/28/15 1105  . ondansetron (ZOFRAN) tablet 4 mg  4 mg Oral Q6H PRN Reubin Milan, MD       Or  . ondansetron  [facility]) injection 4 mg  4 mg Intravenous Q6H PRN Reubin Milan, MD      . oxyCODONE (Oxy IR/ROXICODONE) immediate release tablet 5 mg  5 mg Oral Q4H PRN Reubin Milan, MD   5 mg at 09/29/15 1112  . pantoprazole (PROTONIX) EC tablet 40 mg  40 mg Oral Daily Reubin Milan, MD   40 mg at 09/29/15 0906  . predniSONE (DELTASONE) tablet 40 mg  40 mg Oral Q breakfast Ripudeep Krystal Eaton, MD   40 mg at 09/29/15 0740  . pregabalin (LYRICA) capsule 75 mg  75 mg Oral TID Greta Doom, MD   75 mg at 09/29/15 0906  . senna-docusate (Senokot-S) tablet 1 tablet  1 tablet Oral QHS PRN Reubin Milan, MD   1 tablet at 09/29/15 0336  . sodium chloride 0.9 % injection 3 mL  3 mL Intravenous Q12H Reubin Milan, MD   3 mL at 09/29/15 0907  . sodium chloride 0.9 % injection 3 mL  3 mL Intravenous Q12H Reubin Milan, MD   3 mL at 09/29/15 0907  . sodium chloride 0.9 % injection 3 mL  3 mL Intravenous PRN Reubin Milan, MD         Discharge Medications: Please see discharge summary for a list of discharge medications.  Relevant Imaging Results:  Relevant Lab Results:   Additional Information SSN [id number] Ludwig Clarks, LCSW

## 2015-09-29 NOTE — NC FL2 (Deleted)
[place] LEVEL OF CARE SCREENING TOOL     IDENTIFICATION  Patient Name: Andrea Berry Birthdate: 1966/07/02 Sex: female Admission Date (Current Location): 09/21/2015  [facility] and [place] Number:  Herbalist and Address:  The [place]. [facility], [place] [address] [place], [place] [address]      Provider Number: [id number]  Attending Physician Name and Address:  Mendel Corning, MD  Relative Name and Phone Number:       Current Level of Care: Hospital Recommended Level of Care: [place] Prior Approval Number:    Date Approved/Denied:   PASRR Number:    Discharge Plan: SNF    Current Diagnoses: Patient Active Problem List   Diagnosis Date Noted  . History of CVA (cerebrovascular accident)   . Leukocytosis   . Thrombocytopenia ([place])   . Transverse myelitis ([place]) 09/22/2015  . Cervical radiculopathy   . Devic's disease ([place])   . SLE (systemic lupus erythematosus) ([place]) 09/21/2015  . Hypertension 09/21/2015  . Depression 09/21/2015  . Blind 01/30/2014  . Multiple thyroid nodules 03/20/2013  . GERD (gastroesophageal reflux disease) 03/20/2013    Orientation RESPIRATION BLADDER Height & Weight    Self, Time, Situation, Place  Normal Continent 5\' 1"  (154.9 cm) 178 lbs.  BEHAVIORAL SYMPTOMS/MOOD NEUROLOGICAL BOWEL NUTRITION STATUS      Continent Diet (renal diet with carb modified)  AMBULATORY STATUS COMMUNICATION OF NEEDS Skin   Limited Assist Verbally Normal                       Personal Care Assistance Level of Assistance  Bathing, Dressing Bathing Assistance: Limited assistance         Functional Limitations Info  Sight          SPECIAL CARE FACTORS FREQUENCY  PT (By licensed PT), OT (By licensed OT)     PT Frequency: 5 OT Frequency: 5            Contractures      Additional Factors Info  Code Status, Allergies Code Status Info: Full Code Allergies Info: NKDA            Current Medications (09/29/2015):  This is the current hospital active medication list Current Facility-Administered Medications  Medication Dose Route Frequency Provider Last Rate Last Dose  .  stroke: mapping our early stages of recovery book   Does not apply Once Reubin Milan, MD      . 0.9 %  sodium chloride infusion  250 mL Intravenous PRN Reubin Milan, MD      . acetaminophen (TYLENOL) tablet 650 mg  650 mg Oral Q4H PRN Greta Doom, MD      . azaTHIOprine [employer]) tablet 50 mg  50 mg Oral Daily Reubin Milan, MD   50 mg at 09/29/15 0906  . calcium gluconate 2 g in sodium chloride 0.9 % 250 mL IVPB  2 g Intravenous Once Greta Doom, MD      . citrate dextrose (ACD-A anticoagulant) solution 500 mL  500 mL Intravenous Continuous Greta Doom, MD      . diphenhydrAMINE (BENADRYL) capsule 25 mg  25 mg Oral Q6H PRN Greta Doom, MD      . enoxaparin (LOVENOX) injection 40 mg  40 mg Subcutaneous Q0600 Greta Doom, MD   40 mg at 09/29/15 0552  . heparin injection 1,000 Units  1,000 Units Intracatheter Once Greta Doom, MD      .  insulin aspart (novoLOG) injection 0-15 Units  0-15 Units Subcutaneous TID WC Thurnell Lose, MD   3 Units at 09/29/15 0740  . insulin aspart (novoLOG) injection 0-5 Units  0-5 Units Subcutaneous QHS Thurnell Lose, MD   2 Units at 09/25/15 2317  . LORazepam (ATIVAN) tablet 0.5 mg  0.5 mg Oral Q4H PRN Reubin Milan, MD   0.5 mg at 09/29/15 1351  . morphine 2 MG/ML injection 1 mg  1 mg Intravenous Q3H PRN Ripudeep Krystal Eaton, MD   1 mg at 09/28/15 1231  . multivitamin with minerals tablet 1 tablet  1 tablet Oral Daily Reubin Milan, MD   1 tablet at 09/29/15 0906  . nitroGLYCERIN (NITROSTAT) SL tablet 0.4 mg  0.4 mg Sublingual Q5 min PRN Skeet Simmer, RPH   0.4 mg at 09/28/15 1105  . ondansetron (ZOFRAN) tablet 4 mg  4 mg Oral Q6H PRN Reubin Milan, MD       Or  . ondansetron  [facility]) injection 4 mg  4 mg Intravenous Q6H PRN Reubin Milan, MD      . oxyCODONE (Oxy IR/ROXICODONE) immediate release tablet 5 mg  5 mg Oral Q4H PRN Reubin Milan, MD   5 mg at 09/29/15 1112  . pantoprazole (PROTONIX) EC tablet 40 mg  40 mg Oral Daily Reubin Milan, MD   40 mg at 09/29/15 0906  . predniSONE (DELTASONE) tablet 40 mg  40 mg Oral Q breakfast Ripudeep Krystal Eaton, MD   40 mg at 09/29/15 0740  . pregabalin (LYRICA) capsule 75 mg  75 mg Oral TID Greta Doom, MD   75 mg at 09/29/15 0906  . senna-docusate (Senokot-S) tablet 1 tablet  1 tablet Oral QHS PRN Reubin Milan, MD   1 tablet at 09/29/15 0336  . sodium chloride 0.9 % injection 3 mL  3 mL Intravenous Q12H Reubin Milan, MD   3 mL at 09/29/15 0907  . sodium chloride 0.9 % injection 3 mL  3 mL Intravenous Q12H Reubin Milan, MD   3 mL at 09/29/15 0907  . sodium chloride 0.9 % injection 3 mL  3 mL Intravenous PRN Reubin Milan, MD         Discharge Medications: Please see discharge summary for a list of discharge medications.  Relevant Imaging Results:  Relevant Lab Results:   Additional Information SSN [id number]  Ludwig Clarks, LCSW

## 2015-09-29 NOTE — Progress Notes (Signed)
[facility]                                                                              Patient Demographics  Andrea Berry, is a 50 y.o. female, DOB - 30-Oct-1965, FT:[id number]  Admit date - 09/21/2015   Admitting Physician Reubin Milan, MD  Outpatient Primary MD for the patient is [name], NP  LOS - 7   Chief Complaint  Patient presents with  . Extremity Weakness       Brief HPI   Pleasant 50 year old African-American female with history of CVA in the past, hypertension, GERD, anxiety, depression, lupus with legal blindness, who is chronically on prednisone presented to the hospital with chief complaints of left-sided weakness are more than leg, in the ER she was seen by neurology and workup was consistent with Devic's disease. She was started on IV Solu-Medrol along with plasma exchange and admitted to [facility] service. She has minimal improvement in her left arm strength, plan to complete her plasma exchange treatments on 09/30/2015 and thereafter likely be discharged to SNF/CIR.   Assessment & Plan   Severe left arm weakness ongoing for 3 days PTA. Underlying history of legal blindness. Secondary to Devic's disease - Per patient, no significant improvement with Solu-Medrol and the plasmapheresis - Neurology consulted. Patient was placed on IV Solu-Medrol total of 5 doses stop date 09/26/2015 and due for plasma exchange every other day for a total 5 runs to be finished on 09/30/2015 as directed by neurology.  - Further management , deferred to neurology.  - Continue supportive care with PT OT - CIR consult placed--> recommended skilled nursing facility  Transient mild Hypotension with bradycardia, chest pain - resolved, no complaints of CP today or hypotension - prednisone 40 mg daily, slowly taper to 5 mg maintenance dose. - EKG showed anterolateral T-wave inversions, similar to prior EKG on 1/16 and 1/11. 2-D echo on 1/12 had shown EF of 65-70%,  grade 1 diastolic dysfunction   History of lupus. On prednisone at home - Patient was placed on IV Solu-Medrol, along with Imuran. Outpatient follow-up with Dr. Estanislado Pandy postdischarge. -Continue prednisone and taper slowly to maintenance dose of 5 mg   History of TIA/stroke in the past. No acute issues.   Prolonged QTC upon admission. IV magnesium, low-dose beta blocker, monitor on telemetry. Resolved on repeat EKG   Dehydration with tachycardia. Hydrated, and improved, stable echogram.   GERD. On PPI.  Leuokocytosis due to steroids. Afebrile monitor.  Low TSH. Could be sick euthyroid, repeat TSH along with T3 and free T4.   Steroid induced hyperglycemia - Place on sliding scale insulin  Code Status: Full CODE STATUS  Family Communication: Discussed in detail with the patient, all imaging results, lab results explained to the patient . I called patient's son, [name] unable to reach him.   Disposition Plan:   Time Spent in minutes  15 minutes  Procedures   CT head, MRI brain and C-spine consistent with Devic's disease.  Lumbar puncture not consistent with meningitis   Right IJ venous dialysis catheter placed by IR on 09/23/2015  TTE   -  Left ventricle: The cavity size was normal. Wall thickness wasincreased in a pattern of moderate to severe LVH. Systolicfunction was vigorous. The estimated ejection fraction was in the range of 65% to 70%. There was dynamic obstruction in the mid cavity, with a peak velocity of 314 cm/sec and a peak gradient of39 mm Hg. Wall motion was normal; there were no regional wall motion abnormalities. Doppler parameters are consistent with abnormal left ventricular relaxation (grade 1 diastolic dysfunction). - Left atrium: The atrium was mildly dilated. - Pericardium, extracardiac: A small pericardial effusion was identified circumferential to the heart  Consults   Neurology  DVT Prophylaxis  Lovenox  Medications  Scheduled Meds: .   stroke: mapping our early stages of recovery book   Does not apply Once  . azaTHIOprine  50 mg Oral Daily  . calcium gluconate IVPB  2 g Intravenous Once  . enoxaparin (LOVENOX) injection  40 mg Subcutaneous Q0600  . heparin  1,000 Units Intracatheter Once  . insulin aspart  0-15 Units Subcutaneous TID WC  . insulin aspart  0-5 Units Subcutaneous QHS  . multivitamin with minerals  1 tablet Oral Daily  . pantoprazole  40 mg Oral Daily  . predniSONE  40 mg Oral Q breakfast  . pregabalin  75 mg Oral TID  . sodium chloride  3 mL Intravenous Q12H  . sodium chloride  3 mL Intravenous Q12H   Continuous Infusions: . citrate dextrose     PRN Meds:.sodium chloride, acetaminophen, diphenhydrAMINE, LORazepam, morphine injection, nitroGLYCERIN, ondansetron **OR** ondansetron (ZOFRAN) IV, oxyCODONE, senna-docusate, sodium chloride   Antibiotics   Anti-infectives    Start     Dose/Rate Route Frequency Ordered Stop   09/23/15 0815  ceFAZolin (ANCEF) IVPB 2 g/50 mL premix     2 g 100 mL/hr over 30 Minutes Intravenous  Once 09/23/15 0814 09/23/15 [id number]        Subjective:   Andrea Berry was seen and examined today.  Persisting left side weakness. Patient  denies any chest pain, shortness of breath, dizziness abdominal pain, N/V/D/C. No fevers or chills  Objective:   Blood pressure 145/86, pulse 80, temperature 98.8 F (37.1 C), temperature source Oral, resp. rate 18, height 5\' 1"  (1.549 m), weight 81 kg (178 lb 9.2 oz), SpO2 98 %.  Wt Readings from Last 3 Encounters:  09/28/15 81 kg (178 lb 9.2 oz)  02/08/14 73.199 kg (161 lb 6 oz)  01/29/14 75.297 kg (166 lb)     Intake/Output Summary (Last 24 hours) at 09/29/15 1330 Last data filed at 09/29/15 1100  Gross per 24 hour  Intake    480 ml  Output      0 ml  Net    480 ml    Exam  General: Alert and oriented x 3, NAD  HEENT:  PERRLA, EOMI, Anicteric Sclera, mucous membranes moist.   Neck: Supple, no JVD, no masses  CVS: S1  S2clear, RRR  Respiratory: CTAB  Abdomen: Soft, nontender, nondistended, + bowel sounds  Ext: no cyanosis clubbing or edema  Neuro: left arm strength 1/5, left leg 3/5, legally blind  Skin: No rashes  Psych: Normal affect and demeanor, alert and oriented x3    Data Review   Micro Results Recent Results (from the past 240 hour(s))  CSF culture     Status: None   Collection Time: 09/22/15  8:57 AM  Result Value Ref Range Status   Specimen Description CSF TUBE 2  Final   Special Requests NONE  Final   Gram Stain   Final    WBC PRESENT,BOTH PMN AND MONONUCLEAR NO ORGANISMS SEEN CYTOSPIN SMEAR    Culture NO GROWTH 3 DAYS  Final   Report Status 09/25/2015 FINAL  Final    Radiology Reports Dg Chest 2 View  09/22/2015  CLINICAL DATA:  TIA. EXAM: CHEST  2 VIEW COMPARISON:  04/29/2012 FINDINGS: Lung volumes are low. Cardiomegaly is unchanged allowing for differences in technique. Minimal bibasilar subsegmental atelectasis no pulmonary edema, consolidation, pleural effusion or pneumothorax. No acute osseous abnormalities are seen. IMPRESSION: Low lung volumes with stable cardiomegaly. Electronically Signed   By: Jeb Levering M.D.   On: 09/22/2015 03:36   Ct Head Wo Contrast  09/21/2015  CLINICAL DATA:  Acute left-sided weakness. EXAM: CT HEAD WITHOUT CONTRAST TECHNIQUE: Contiguous axial images were obtained from the base of the skull through the vertex without intravenous contrast. COMPARISON:  CT scan of June 24, 2014. FINDINGS: Bony calvarium appears intact. No mass effect or midline shift is noted. Ventricular size is within normal limits. There is no evidence of mass lesion, hemorrhage or acute infarction. IMPRESSION: Normal head CT. Electronically Signed   By: Marijo Conception, M.D.   On: 09/21/2015 16:27   Mr Brain Wo Contrast  09/21/2015  CLINICAL DATA:  Left arm numbness and pain. Neck pain. Stroke versus cervical spine disease. EXAM: MRI HEAD WITHOUT CONTRAST MRI CERVICAL  SPINE WITHOUT CONTRAST TECHNIQUE: Multiplanar, multiecho pulse sequences of the brain and surrounding structures, and cervical spine, to include the craniocervical junction and cervicothoracic junction, were obtained without intravenous contrast. COMPARISON:  Head CT earlier same day.  MRI 06/24/2014 FINDINGS: MRI HEAD FINDINGS There is a 1 cm region of T2 and FLAIR abnormality within the left thalamus. No restricted diffusion presently. On the old MRI, there appear to be an old small vessel insult in this location. Elsewhere, the cerebral hemispheres show a few punctate foci of T2 and FLAIR signal in the deep white matter. No cortical or large vessel territory insult. No mass lesion, hemorrhage, hydrocephalus or extra-axial collection. No pituitary mass. No inflammatory sinus disease. No skull or skullbase lesion. MRI CERVICAL SPINE FINDINGS The cervical spinal cord is diffusely abnormal showing enlargement and inter medullary T2 signal. This extends from the obex all the way to the C7 level. This is consistent with acute deep mild is a shin or myelitis. There are no degenerative changes. There is no compressive pathology. IMPRESSION: 1 cm region of abnormal T2 and FLAIR signal within the left thalamus not consistent with acute infarction. This could be an acute focus of demyelination or conceivably but less likely a late subacute infarction. Diffusely abnormal cervical spinal cord which is swollen and shows diffuse abnormal T2 signal. This is likely to represent acute demyelination/neuromyelitis optica (Devic's disease) or acute transverse myelitis. Quite likely that the findings of the left thalamus in the spinal cord are due to the same pathology. Electronically Signed   By: Nelson Chimes M.D.   On: 09/21/2015 20:41   Mr Jeri Cos Contrast  09/22/2015  CLINICAL DATA:  50 yo female with his history of bilateral optic neuritis now with longitudinally extensive transverse myelitis. Weakness of arms and legs. EXAM:  MRI CERVICAL SPINE WITH CONTRAST; MRI HEAD WITH CONTRAST TECHNIQUE: Multiplanar and multiecho pulse sequences of the cervical spine, to include the craniocervical junction and cervicothoracic junction, were obtained according to standard protocol with intravenous contrast.; Multiplanar, multiecho pulse sequences of the brain and surrounding structures were obtained according  to standard protocol with intravenous contrast CONTRAST:  43mL MULTIHANCE GADOBENATE DIMEGLUMINE 529 MG/ML IV SOLN COMPARISON:  Noncontrast brain and cervical spine from 07/21/2016. FINDINGS: Post infusion images of the brain demonstrates no abnormal enhancement. LEFT thalamic lesion, T2 and FLAIR hyperintense, probably represents an atypical area of demyelination, but non acute, given the lack of restricted diffusion and absence of blood brain barrier breakdown. In the cervical spine, there is extensive postcontrast enhancement, corresponding to the T2 and FLAIR hyperintensity on yesterday's exam, extending over multiple cervical segments, from the foramen magnum through C7. This enhancement involves both gray and white matter. There is greater involvement of the LEFT hemicord. In the setting of a patient with history of optic neuritis, the findings are consistent with Devic's disease. No features specific for optic neuritis can be observed on post infusion imaging through the brain, non dedicated orbit study. IMPRESSION: No abnormal intracranial enhancement is documented. Heterogeneous enhancement of the enlarged cervical cord segments, from foramen magnum through C7, involving both gray and white matter, consistent with Devic's disease. Electronically Signed   By: Staci Righter M.D.   On: 09/22/2015 12:39   Mr Cervical Spine Wo Contrast  09/21/2015  CLINICAL DATA:  Left arm numbness and pain. Neck pain. Stroke versus cervical spine disease. EXAM: MRI HEAD WITHOUT CONTRAST MRI CERVICAL SPINE WITHOUT CONTRAST TECHNIQUE: Multiplanar,  multiecho pulse sequences of the brain and surrounding structures, and cervical spine, to include the craniocervical junction and cervicothoracic junction, were obtained without intravenous contrast. COMPARISON:  Head CT earlier same day.  MRI 06/24/2014 FINDINGS: MRI HEAD FINDINGS There is a 1 cm region of T2 and FLAIR abnormality within the left thalamus. No restricted diffusion presently. On the old MRI, there appear to be an old small vessel insult in this location. Elsewhere, the cerebral hemispheres show a few punctate foci of T2 and FLAIR signal in the deep white matter. No cortical or large vessel territory insult. No mass lesion, hemorrhage, hydrocephalus or extra-axial collection. No pituitary mass. No inflammatory sinus disease. No skull or skullbase lesion. MRI CERVICAL SPINE FINDINGS The cervical spinal cord is diffusely abnormal showing enlargement and inter medullary T2 signal. This extends from the obex all the way to the C7 level. This is consistent with acute deep mild is a shin or myelitis. There are no degenerative changes. There is no compressive pathology. IMPRESSION: 1 cm region of abnormal T2 and FLAIR signal within the left thalamus not consistent with acute infarction. This could be an acute focus of demyelination or conceivably but less likely a late subacute infarction. Diffusely abnormal cervical spinal cord which is swollen and shows diffuse abnormal T2 signal. This is likely to represent acute demyelination/neuromyelitis optica (Devic's disease) or acute transverse myelitis. Quite likely that the findings of the left thalamus in the spinal cord are due to the same pathology. Electronically Signed   By: Nelson Chimes M.D.   On: 09/21/2015 20:41   Mr Cervical Spine W Contrast  09/22/2015  CLINICAL DATA:  50 yo female with his history of bilateral optic neuritis now with longitudinally extensive transverse myelitis. Weakness of arms and legs. EXAM: MRI CERVICAL SPINE WITH CONTRAST; MRI  HEAD WITH CONTRAST TECHNIQUE: Multiplanar and multiecho pulse sequences of the cervical spine, to include the craniocervical junction and cervicothoracic junction, were obtained according to standard protocol with intravenous contrast.; Multiplanar, multiecho pulse sequences of the brain and surrounding structures were obtained according to standard protocol with intravenous contrast CONTRAST:  75mL MULTIHANCE GADOBENATE DIMEGLUMINE 529 MG/ML  IV SOLN COMPARISON:  Noncontrast brain and cervical spine from 07/21/2016. FINDINGS: Post infusion images of the brain demonstrates no abnormal enhancement. LEFT thalamic lesion, T2 and FLAIR hyperintense, probably represents an atypical area of demyelination, but non acute, given the lack of restricted diffusion and absence of blood brain barrier breakdown. In the cervical spine, there is extensive postcontrast enhancement, corresponding to the T2 and FLAIR hyperintensity on yesterday's exam, extending over multiple cervical segments, from the foramen magnum through C7. This enhancement involves both gray and white matter. There is greater involvement of the LEFT hemicord. In the setting of a patient with history of optic neuritis, the findings are consistent with Devic's disease. No features specific for optic neuritis can be observed on post infusion imaging through the brain, non dedicated orbit study. IMPRESSION: No abnormal intracranial enhancement is documented. Heterogeneous enhancement of the enlarged cervical cord segments, from foramen magnum through C7, involving both gray and white matter, consistent with Devic's disease. Electronically Signed   By: Staci Righter M.D.   On: 09/22/2015 12:39   Ir Fluoro Guide Cv Line Right  09/23/2015  CLINICAL DATA:  Transverse myelitis.  Plasmapheresis. EXAM: TUNNELED DIALYSIS CATHETER PLACEMENT, ULTRASOUND GUIDANCE FOR VASCULAR ACCESS FLUOROSCOPY TIME:  24 seconds MEDICATIONS AND MEDICAL HISTORY: Versed 1.5 mg, Fentanyl 75  mcg. Additional Medications: As antibiotic prophylaxis, Ancef was ordered pre-procedure and administered intravenously within one hour of incision. ANESTHESIA/SEDATION: Moderate sedation time: 20 minutes CONTRAST:  None PROCEDURE: The procedure, risks, benefits, and alternatives were explained to the patient. Questions regarding the procedure were encouraged and answered. The patient understands and consents to the procedure. The right neck was prepped with ChloraPrep in a sterile fashion, and a sterile drape was applied covering the operative field. A sterile gown and sterile gloves were used for the procedure. 1% lidocaine into the skin and subcutaneous tissue. The right jugular vein was noted to be patent initially with ultrasound. Under sonographic guidance, a micropuncture needle was inserted into the right IJ vein (Ultrasound and fluoroscopic image documentation was performed). It was removed over an 018 wire which was upsized to an Amplatz. This was advanced into the IVC. A small incision was made in the right upper chest. The tunneling device was utilized to advance the 23 centimeter tip to cuff catheter from the chest incision and out the neck incision. A peel-away sheath was advanced over the Amplatz wire. The leading edge of the catheter was then advanced through the peel-away sheath. The peel-away sheath was removed. It was flushed and instilled with heparin. The chest incision was closed with a 0 Prolene pursestring stitch. The neck incision was closed with a 4-0 Vicryl subcuticular stitch. COMPLICATIONS: None FINDINGS: The image demonstrates placement of a tunneled dialysis catheter with its tip in the right atrium. IMPRESSION: Successful right IJ vein tunneled dialysis catheter with its tip in the right atrium. Electronically Signed   By: Marybelle Killings M.D.   On: 09/23/2015 12:33   Ir US Guide Vasc Access Right  09/23/2015  CLINICAL DATA:  Transverse myelitis.  Plasmapheresis. EXAM: TUNNELED  DIALYSIS CATHETER PLACEMENT, ULTRASOUND GUIDANCE FOR VASCULAR ACCESS FLUOROSCOPY TIME:  24 seconds MEDICATIONS AND MEDICAL HISTORY: Versed 1.5 mg, Fentanyl 75 mcg. Additional Medications: As antibiotic prophylaxis, Ancef was ordered pre-procedure and administered intravenously within one hour of incision. ANESTHESIA/SEDATION: Moderate sedation time: 20 minutes CONTRAST:  None PROCEDURE: The procedure, risks, benefits, and alternatives were explained to the patient. Questions regarding the procedure were encouraged and answered. The patient understands and  consents to the procedure. The right neck was prepped with ChloraPrep in a sterile fashion, and a sterile drape was applied covering the operative field. A sterile gown and sterile gloves were used for the procedure. 1% lidocaine into the skin and subcutaneous tissue. The right jugular vein was noted to be patent initially with ultrasound. Under sonographic guidance, a micropuncture needle was inserted into the right IJ vein (Ultrasound and fluoroscopic image documentation was performed). It was removed over an 018 wire which was upsized to an Amplatz. This was advanced into the IVC. A small incision was made in the right upper chest. The tunneling device was utilized to advance the 23 centimeter tip to cuff catheter from the chest incision and out the neck incision. A peel-away sheath was advanced over the Amplatz wire. The leading edge of the catheter was then advanced through the peel-away sheath. The peel-away sheath was removed. It was flushed and instilled with heparin. The chest incision was closed with a 0 Prolene pursestring stitch. The neck incision was closed with a 4-0 Vicryl subcuticular stitch. COMPLICATIONS: None FINDINGS: The image demonstrates placement of a tunneled dialysis catheter with its tip in the right atrium. IMPRESSION: Successful right IJ vein tunneled dialysis catheter with its tip in the right atrium. Electronically Signed   By: Marybelle Killings M.D.   On: 09/23/2015 12:33   Dg Fluoro Guide Lumbar Puncture  09/22/2015  CLINICAL DATA:  Transverse myelitis. EXAM: DIAGNOSTIC LUMBAR PUNCTURE UNDER FLUOROSCOPIC GUIDANCE FLUOROSCOPY TIME:  Fluoroscopy Time (in minutes and seconds): 0 minutes, 37 seconds Number of Acquired Images:  0 PROCEDURE: I discussed the risks (including hemorrhage, infection, headache, and nerve damage, among others), benefits, and alternatives to fluoroscopically guided lumbar puncture with the patient. We specifically discussed the high technical likelihood of success of the procedure. The patient understood and elected to undergo the procedure. Standard time-out was employed. Following sterile skin prep and local anesthetic administration consisting of 1 percent lidocaine, a 22 gauge spinal needle was advanced without difficulty into the thecal sac at the at the L4-5 level. Clear CSF was returned. Opening pressure was 17 cm of water. 13 cc of clear CSF was collected. The needle was subsequently removed and the skin cleansed and bandaged. No immediate complications were observed. IMPRESSION: 1. Technically successful fluoroscopically guided lumbar puncture at the L4-5 level. No complicating feature observed. Opening pressure 17 cm of water. Electronically Signed   By: Van Clines M.D.   On: 09/22/2015 09:15    CBC  Recent Labs Lab 09/25/15 0531 09/26/15 0401 09/26/15 1258 09/27/15 0710 09/28/15 0430 09/28/15 1002 09/29/15 0716  WBC 14.2* 12.5*  --  10.6* 12.1*  --  10.1  HGB 11.8* 11.8* 12.6 12.0 11.4* 12.6 11.0*  HCT 36.8 36.4 37.0 37.4 35.3* 37.0 33.8*  PLT 145* 146*  --  144* 153  --  120*  MCV 72.4* 71.9*  --  72.1* 72.2*  --  72.4*  MCH 23.2* 23.3*  --  23.1* 23.3*  --  23.6*  MCHC 32.1 32.4  --  32.1 32.3  --  32.5  RDW 14.9 14.8  --  14.4 14.4  --  14.5  LYMPHSABS 0.7 0.7  --  0.6* 0.8  --  1.4  MONOABS 0.3 0.2  --  0.2 0.7  --  0.8  EOSABS 0.0 0.0  --  0.0 0.0  --  0.0  BASOSABS 0.0 0.0  --   0.0 0.0  --  0.0  Chemistries   Recent Labs Lab 09/23/15 0433 09/23/15 1553 09/24/15 0415 09/24/15 1600 09/26/15 1258 09/28/15 0430 09/28/15 1002  NA 139 140 141 143 142  --  142  K 5.0 4.3 4.7 4.5 4.0  --  3.5  CL 110 109 117* 110 106  --  103  CO2 21*  --  19*  --   --   --   --   GLUCOSE 124* 202* 132* 126* 160*  --  159*  BUN 9 12 13 17 19   --  21*  CREATININE 0.69 0.60 0.69 0.70 0.60 0.69 0.70  CALCIUM 9.6  --  8.8*  --   --   --   --   AST 50*  --  21  --   --   --   --   ALT 48  --  18  --   --   --   --   ALKPHOS 54  --  22*  --   --   --   --   BILITOT 0.8  --  0.5  --   --   --   --    ------------------------------------------------------------------------------------------------------------------ estimated creatinine clearance is 83.9 mL/min (by C-G formula based on Cr of 0.7). ------------------------------------------------------------------------------------------------------------------ No results for input(s): HGBA1C in the last 72 hours. ------------------------------------------------------------------------------------------------------------------ No results for input(s): CHOL, HDL, LDLCALC, TRIG, CHOLHDL, LDLDIRECT in the last 72 hours. ------------------------------------------------------------------------------------------------------------------  Recent Labs  09/27/15 1340  TSH 0.273*   ------------------------------------------------------------------------------------------------------------------ No results for input(s): VITAMINB12, FOLATE, FERRITIN, TIBC, IRON, RETICCTPCT in the last 72 hours.  Coagulation profile  Recent Labs Lab 09/23/15 0433 09/26/15 0850  INR 1.07 1.36    No results for input(s): DDIMER in the last 72 hours.  Cardiac Enzymes  Recent Labs Lab 09/28/15 1400 09/28/15 2105 09/29/15 0716  TROPONINI <0.03 0.04* <0.03    ------------------------------------------------------------------------------------------------------------------ Invalid input(s): POCBNP   Recent Labs  09/28/15 0651 09/28/15 1200 09/28/15 1625 09/28/15 2226 09/29/15 0718 09/29/15 1142  GLUCAP 97 144* 123* 107* 172* 106*     Parks Czajkowski M.D. [facility] 09/29/2015, 1:30 PM  Pager: [phone] Between 7am to 7pm - call Pager - [phone]  After 7pm go to [email or web address] - password [email or web address]  Call night coverage person covering after 7pm

## 2015-09-29 NOTE — NC FL2 (Signed)
[place] LEVEL OF CARE SCREENING TOOL     IDENTIFICATION  Patient Name: Andrea Berry Birthdate: 12-23-1965 Sex: female Admission Date (Current Location): 09/21/2015  [facility] and [place] Number:  Herbalist and Address:  [facility] [place], [place] [address], [place], [place] [address]      Provider Number: [id number]  Attending Physician Name and Address:  Mendel Corning, MD  Relative Name and Phone Number:       Current Level of Care: Hospital Recommended Level of Care: [place] Prior Approval Number:    Date Approved/Denied:   PASRR Number:   [id number]   Discharge Plan: SNF    Current Diagnoses: Patient Active Problem List   Diagnosis Date Noted  . History of CVA (cerebrovascular accident)   . Leukocytosis   . Thrombocytopenia ([place])   . Transverse myelitis ([place]) 09/22/2015  . Cervical radiculopathy   . Devic's disease ([place])   . SLE (systemic lupus erythematosus) ([place]) 09/21/2015  . Hypertension 09/21/2015  . Depression 09/21/2015  . Blind 01/30/2014  . Multiple thyroid nodules 03/20/2013  . GERD (gastroesophageal reflux disease) 03/20/2013    Orientation RESPIRATION BLADDER Height & Weight    Self, Time, Situation, Place  Normal Continent 5\' 1"  (154.9 cm) 178 lbs.  BEHAVIORAL SYMPTOMS/MOOD NEUROLOGICAL BOWEL NUTRITION STATUS      Continent Diet (renal diet with carb modified)  AMBULATORY STATUS COMMUNICATION OF NEEDS Skin   Limited Assist Verbally Normal                       Personal Care Assistance Level of Assistance  Bathing, Dressing Bathing Assistance: Limited assistance         Functional Limitations Info  Sight          SPECIAL CARE FACTORS FREQUENCY  PT (By licensed PT), OT (By licensed OT)     PT Frequency: 5 OT Frequency: 5            Contractures      Additional Factors Info  Code Status, Allergies Code Status Info: Full Code Allergies Info: NKDA            Current Medications (09/29/2015):  This is the current hospital active medication list Current Facility-Administered Medications  Medication Dose Route Frequency Provider Last Rate Last Dose  .  stroke: mapping our early stages of recovery book   Does not apply Once Reubin Milan, MD      . 0.9 %  sodium chloride infusion  250 mL Intravenous PRN Reubin Milan, MD      . acetaminophen (TYLENOL) tablet 650 mg  650 mg Oral Q4H PRN Greta Doom, MD      . azaTHIOprine [employer]) tablet 50 mg  50 mg Oral Daily Reubin Milan, MD   50 mg at 09/29/15 0906  . calcium gluconate 2 g in sodium chloride 0.9 % 250 mL IVPB  2 g Intravenous Once Greta Doom, MD      . citrate dextrose (ACD-A anticoagulant) solution 500 mL  500 mL Intravenous Continuous Greta Doom, MD      . diphenhydrAMINE (BENADRYL) capsule 25 mg  25 mg Oral Q6H PRN Greta Doom, MD      . enoxaparin (LOVENOX) injection 40 mg  40 mg Subcutaneous Q0600 Greta Doom, MD   40 mg at 09/29/15 0552  . heparin injection 1,000 Units  1,000 Units Intracatheter Once [employer]  Leonel Ramsay, MD      . insulin aspart (novoLOG) injection 0-15 Units  0-15 Units Subcutaneous TID WC Thurnell Lose, MD   3 Units at 09/29/15 0740  . insulin aspart (novoLOG) injection 0-5 Units  0-5 Units Subcutaneous QHS Thurnell Lose, MD   2 Units at 09/25/15 2317  . LORazepam (ATIVAN) tablet 0.5 mg  0.5 mg Oral Q4H PRN Reubin Milan, MD   0.5 mg at 09/29/15 1351  . morphine 2 MG/ML injection 1 mg  1 mg Intravenous Q3H PRN Ripudeep Krystal Eaton, MD   1 mg at 09/28/15 1231  . multivitamin with minerals tablet 1 tablet  1 tablet Oral Daily Reubin Milan, MD   1 tablet at 09/29/15 0906  . nitroGLYCERIN (NITROSTAT) SL tablet 0.4 mg  0.4 mg Sublingual Q5 min PRN Skeet Simmer, RPH   0.4 mg at 09/28/15 1105  . ondansetron (ZOFRAN) tablet 4 mg  4 mg Oral Q6H PRN Reubin Milan, MD       Or  . ondansetron  [facility]) injection 4 mg  4 mg Intravenous Q6H PRN Reubin Milan, MD      . oxyCODONE (Oxy IR/ROXICODONE) immediate release tablet 5 mg  5 mg Oral Q4H PRN Reubin Milan, MD   5 mg at 09/29/15 1112  . pantoprazole (PROTONIX) EC tablet 40 mg  40 mg Oral Daily Reubin Milan, MD   40 mg at 09/29/15 0906  . predniSONE (DELTASONE) tablet 40 mg  40 mg Oral Q breakfast Ripudeep Krystal Eaton, MD   40 mg at 09/29/15 0740  . pregabalin (LYRICA) capsule 75 mg  75 mg Oral TID Greta Doom, MD   75 mg at 09/29/15 0906  . senna-docusate (Senokot-S) tablet 1 tablet  1 tablet Oral QHS PRN Reubin Milan, MD   1 tablet at 09/29/15 0336  . sodium chloride 0.9 % injection 3 mL  3 mL Intravenous Q12H Reubin Milan, MD   3 mL at 09/29/15 0907  . sodium chloride 0.9 % injection 3 mL  3 mL Intravenous Q12H Reubin Milan, MD   3 mL at 09/29/15 0907  . sodium chloride 0.9 % injection 3 mL  3 mL Intravenous PRN Reubin Milan, MD         Discharge Medications: Please see discharge summary for a list of discharge medications.  Relevant Imaging Results:  Relevant Lab Results:   Additional Information SSN [id number]  Ludwig Clarks, LCSW

## 2015-09-30 LAB — CBC WITH DIFFERENTIAL/PLATELET
BASOS ABS: 0 10*3/uL (ref 0.0–0.1)
Basophils Relative: 0 %
Eosinophils Absolute: 0 10*3/uL (ref 0.0–0.7)
Eosinophils Relative: 0 %
HEMATOCRIT: 34.7 % — AB (ref 36.0–46.0)
Hemoglobin: 11.2 g/dL — ABNORMAL LOW (ref 12.0–15.0)
LYMPHS ABS: 2 10*3/uL (ref 0.7–4.0)
LYMPHS PCT: 22 %
MCH: 23.3 pg — AB (ref 26.0–34.0)
MCHC: 32.3 g/dL (ref 30.0–36.0)
MCV: 72.1 fL — AB (ref 78.0–100.0)
MONO ABS: 0.6 10*3/uL (ref 0.1–1.0)
Monocytes Relative: 7 %
NEUTROS ABS: 6.3 10*3/uL (ref 1.7–7.7)
Neutrophils Relative %: 70 %
Platelets: 132 10*3/uL — ABNORMAL LOW (ref 150–400)
RBC: 4.81 MIL/uL (ref 3.87–5.11)
RDW: 14.3 % (ref 11.5–15.5)
WBC: 8.9 10*3/uL (ref 4.0–10.5)

## 2015-09-30 LAB — GLUCOSE, CAPILLARY
GLUCOSE-CAPILLARY: 123 mg/dL — AB (ref 65–99)
Glucose-Capillary: 100 mg/dL — ABNORMAL HIGH (ref 65–99)
Glucose-Capillary: 126 mg/dL — ABNORMAL HIGH (ref 65–99)

## 2015-09-30 LAB — T3: T3 TOTAL: 55 ng/dL — AB (ref 71–180)

## 2015-09-30 MED ORDER — ACD FORMULA A 0.73-2.45-2.2 GM/100ML VI SOLN
Status: AC
  Filled 2015-09-30: qty 500

## 2015-09-30 MED ORDER — ACETAMINOPHEN 325 MG PO TABS: 650.0000 mg | ORAL_TABLET | ORAL | Status: DC | PRN

## 2015-09-30 MED ORDER — CALCIUM CARBONATE ANTACID 500 MG PO CHEW: 2.0000 | CHEWABLE_TABLET | ORAL | Status: DC

## 2015-09-30 MED ORDER — SODIUM CHLORIDE 0.9 % IV SOLN
2.0000 g | Freq: Once | INTRAVENOUS | Status: AC
  Administered 2015-09-30: 2 g via INTRAVENOUS
  Filled 2015-09-30 (×2): qty 20

## 2015-09-30 MED ORDER — HEPARIN SODIUM (PORCINE) 1000 UNIT/ML IJ SOLN
1000.0000 [IU] | Freq: Once | INTRAMUSCULAR | Status: AC
  Filled 2015-09-30: qty 1

## 2015-09-30 MED ORDER — SODIUM CHLORIDE 0.9 % IV SOLN
INTRAVENOUS | Status: AC
  Administered 2015-09-30 (×2): via INTRAVENOUS_CENTRAL
  Filled 2015-09-30 (×3): qty 200

## 2015-09-30 MED ORDER — ACD FORMULA A 0.73-2.45-2.2 GM/100ML VI SOLN
500.0000 mL | Status: DC
  Filled 2015-09-30: qty 500

## 2015-09-30 MED ORDER — DIPHENHYDRAMINE HCL 25 MG PO CAPS: 25.0000 mg | ORAL_CAPSULE | Freq: Four times a day (QID) | ORAL | Status: DC | PRN

## 2015-09-30 MED ORDER — IMMUNE GLOBULIN (HUMAN) 5 GM/50ML IV SOLN
400.0000 mg/kg | INTRAVENOUS | Status: AC
  Administered 2015-10-01 – 2015-10-05 (×5): 35 g via INTRAVENOUS
  Filled 2015-09-30 (×8): qty 50

## 2015-09-30 NOTE — Care Management Note (Signed)
Case Management Note  Patient Details  Name: Andrea Berry MRN: [id number] Date of Birth: [birth year]  Subjective/Objective:                    Action/Plan: Plan is for patient to start IVIG treatments tomorrow. CM will continue to follow.  Expected Discharge Date:                  Expected Discharge Plan:     In-House Referral:     Discharge planning Services     Post Acute Care Choice:    Choice offered to:     DME Arranged:    DME Agency:     HH Arranged:    HH Agency:     Status of Service:  In process, will continue to follow  Medicare Important Message Given:  Yes Date Medicare IM Given:    Medicare IM give by:    Date Additional Medicare IM Given:    Additional Medicare Important Message give by:     If discussed at [place] of Stay Meetings, dates discussed:    Additional Comments:  Pollie Friar, RN 09/30/2015, 3:45 PM

## 2015-09-30 NOTE — Progress Notes (Signed)
[facility]                                                                              Patient Demographics  Andrea Berry, is a 50 y.o. female, DOB - [birth year], [id number]  Admit date - 09/21/2015   Admitting Physician Reubin Milan, MD  Outpatient Primary MD for the patient is [name], NP  LOS - 8   Chief Complaint  Patient presents with  . Extremity Weakness       Brief HPI   Pleasant 50 year old African-American female with history of CVA in the past, hypertension, GERD, anxiety, depression, lupus with legal blindness, who is chronically on prednisone presented to the hospital with chief complaints of left-sided weakness are more than leg, in the ER she was seen by neurology and workup was consistent with Devic's disease. She was started on IV Solu-Medrol along with plasma exchange and admitted to [facility] service. She has minimal improvement in her left arm strength, plan to complete her plasma exchange treatments on 09/30/2015    Assessment & Plan   Severe left arm weakness ongoing for 3 days PTA. Underlying history of legal blindness. Secondary to Devic's disease - Per patient, no significant improvement with Solu-Medrol and the plasmapheresis - Neurology consulted. Patient was placed on IV Solu-Medrol total of 5 doses stop date 09/26/2015 and due for plasma exchange every other day for a total 5 runs to be finished on 09/30/2015 as directed by neurology.  - Further management , deferred to neurology.  - Continue supportive care with PT OT - Discussed in detail with neurology and patient, patient's son who is getting frustrated as patient had minimal improvement on the left side. Patient was seen by neurology, at this time recommending IVIG.    Transient mild Hypotension with bradycardia, chest pain - resolved, no complaints of CP today or hypotension - prednisone 40 mg daily, slowly taper to 5 mg maintenance dose. - EKG showed  anterolateral T-wave inversions, similar to prior EKG on 1/16 and 1/11. 2-D echo on 1/12 had shown EF of [id number], grade 1 diastolic dysfunction   History of lupus. On prednisone at home - Patient was placed on IV Solu-Medrol, along with Imuran. Outpatient follow-up with Dr. Estanislado Pandy postdischarge. -Continue prednisone and taper slowly to maintenance dose of 5 mg   History of TIA/stroke in the past. No acute issues.   Prolonged QTC upon admission. IV magnesium, low-dose beta blocker, monitor on telemetry. Resolved on repeat EKG   Dehydration with tachycardia. Hydrated, and improved, stable echogram.   GERD. On PPI.  Leuokocytosis due to steroids. Afebrile monitor.  Low TSH. Could be sick euthyroid, repeat TSH along with T3 and free T4.   Steroid induced hyperglycemia - Place on sliding scale insulin  Code Status: Full CODE STATUS  Family Communication: Discussed in detail with the patient, all imaging results, lab results explained to the patient and patient's son, Andrea Berry at the bedside   Disposition Plan:   Time Spent in minutes  15 minutes  Procedures   CT head, MRI brain and C-spine consistent with Devic's disease.  Lumbar puncture not consistent with meningitis  Right IJ venous dialysis catheter placed by IR on 09/23/2015  TTE   - Left ventricle: The cavity size was normal. Wall thickness wasincreased in a pattern of moderate to severe LVH. Systolicfunction was vigorous. The estimated ejection fraction was in the range of 65% to 70%. There was dynamic obstruction in the mid cavity, with a peak velocity of 314 cm/sec and a peak gradient of39 mm Hg. Wall motion was normal; there were no regional wall motion abnormalities. Doppler parameters are consistent with abnormal left ventricular relaxation (grade 1 diastolic dysfunction). - Left atrium: The atrium was mildly dilated. - Pericardium, extracardiac: A small pericardial effusion was identified circumferential to the  heart  Consults   Neurology  DVT Prophylaxis  Lovenox  Medications  Scheduled Meds: .  stroke: mapping our early stages of recovery book   Does not apply Once  . azaTHIOprine  50 mg Oral Daily  . calcium gluconate IVPB  2 g Intravenous Once  . calcium gluconate IVPB  2 g Intravenous Once  . citrate dextrose      . enoxaparin (LOVENOX) injection  40 mg Subcutaneous Q0600  . heparin  1,000 Units Intracatheter Once  . [START ON 10/01/2015] Immune Globulin 10%  400 mg/kg Intravenous Q24 Hr x 5  . insulin aspart  0-15 Units Subcutaneous TID WC  . insulin aspart  0-5 Units Subcutaneous QHS  . multivitamin with minerals  1 tablet Oral Daily  . pantoprazole  40 mg Oral Daily  . predniSONE  40 mg Oral Q breakfast  . pregabalin  75 mg Oral TID  . sodium chloride  3 mL Intravenous Q12H  . sodium chloride  3 mL Intravenous Q12H   Continuous Infusions: . citrate dextrose     PRN Meds:.sodium chloride, acetaminophen, bisacodyl, diphenhydrAMINE, LORazepam, morphine injection, nitroGLYCERIN, ondansetron **OR** ondansetron (ZOFRAN) IV, oxyCODONE, senna-docusate, sodium chloride   Antibiotics   Anti-infectives    Start     Dose/Rate Route Frequency Ordered Stop   09/23/15 0815  ceFAZolin (ANCEF) IVPB 2 g/50 mL premix     2 g 100 mL/hr over 30 Minutes Intravenous  Once 09/23/15 0814 09/23/15 [id number]        Subjective:   Andrea Berry was seen and examined today.  Persisting left side weakness. Minimal improvement. Patient  denies any chest pain, shortness of breath, dizziness abdominal pain, N/V/D/C. No fevers or chills  Objective:   Blood pressure 141/80, pulse 98, temperature 98.8 F (37.1 C), temperature source Oral, resp. rate 20, height 5\' 1"  (1.549 m), weight 87.272 kg (192 lb 6.4 oz), SpO2 98 %.  Wt Readings from Last 3 Encounters:  09/30/15 87.272 kg (192 lb 6.4 oz)  02/08/14 73.199 kg (161 lb 6 oz)  01/29/14 75.297 kg (166 lb)    No intake or output data in the 24 hours  ending 09/30/15 1251  Exam  General: Alert and oriented x 3, NAD  HEENT:  PERRLA, EOMI  Neck: Supple, no JVD, no masses  CVS: S1 S2clear, RRR  Respiratory: CTAB  Abdomen: Soft, nontender, nondistended, + bowel sounds  Ext: no cyanosis clubbing or edema  Neuro: left arm strength 1/5, left leg 0/5, legally blind  Skin: No rashes  Psych: Normal affect and demeanor, alert and oriented x3    Data Review   Micro Results Recent Results (from the past 240 hour(s))  CSF culture     Status: None   Collection Time: 09/22/15  8:57 AM  Result Value Ref Range Status  Specimen Description CSF TUBE 2  Final   Special Requests NONE  Final   Gram Stain   Final    WBC PRESENT,BOTH PMN AND MONONUCLEAR NO ORGANISMS SEEN CYTOSPIN SMEAR    Culture NO GROWTH 3 DAYS  Final   Report Status 09/25/2015 FINAL  Final    Radiology Reports Dg Chest 2 View  09/22/2015  CLINICAL DATA:  TIA. EXAM: CHEST  2 VIEW COMPARISON:  04/29/2012 FINDINGS: Lung volumes are low. Cardiomegaly is unchanged allowing for differences in technique. Minimal bibasilar subsegmental atelectasis no pulmonary edema, consolidation, pleural effusion or pneumothorax. No acute osseous abnormalities are seen. IMPRESSION: Low lung volumes with stable cardiomegaly. Electronically Signed   By: Jeb Levering M.D.   On: 09/22/2015 03:36   Ct Head Wo Contrast  09/21/2015  CLINICAL DATA:  Acute left-sided weakness. EXAM: CT HEAD WITHOUT CONTRAST TECHNIQUE: Contiguous axial images were obtained from the base of the skull through the vertex without intravenous contrast. COMPARISON:  CT scan of June 24, 2014. FINDINGS: Bony calvarium appears intact. No mass effect or midline shift is noted. Ventricular size is within normal limits. There is no evidence of mass lesion, hemorrhage or acute infarction. IMPRESSION: Normal head CT. Electronically Signed   By: Marijo Conception, M.D.   On: 09/21/2015 16:27   Mr Brain Wo Contrast  09/21/2015   CLINICAL DATA:  Left arm numbness and pain. Neck pain. Stroke versus cervical spine disease. EXAM: MRI HEAD WITHOUT CONTRAST MRI CERVICAL SPINE WITHOUT CONTRAST TECHNIQUE: Multiplanar, multiecho pulse sequences of the brain and surrounding structures, and cervical spine, to include the craniocervical junction and cervicothoracic junction, were obtained without intravenous contrast. COMPARISON:  Head CT earlier same day.  MRI 06/24/2014 FINDINGS: MRI HEAD FINDINGS There is a 1 cm region of T2 and FLAIR abnormality within the left thalamus. No restricted diffusion presently. On the old MRI, there appear to be an old small vessel insult in this location. Elsewhere, the cerebral hemispheres show a few punctate foci of T2 and FLAIR signal in the deep white matter. No cortical or large vessel territory insult. No mass lesion, hemorrhage, hydrocephalus or extra-axial collection. No pituitary mass. No inflammatory sinus disease. No skull or skullbase lesion. MRI CERVICAL SPINE FINDINGS The cervical spinal cord is diffusely abnormal showing enlargement and inter medullary T2 signal. This extends from the obex all the way to the C7 level. This is consistent with acute deep mild is a shin or myelitis. There are no degenerative changes. There is no compressive pathology. IMPRESSION: 1 cm region of abnormal T2 and FLAIR signal within the left thalamus not consistent with acute infarction. This could be an acute focus of demyelination or conceivably but less likely a late subacute infarction. Diffusely abnormal cervical spinal cord which is swollen and shows diffuse abnormal T2 signal. This is likely to represent acute demyelination/neuromyelitis optica (Devic's disease) or acute transverse myelitis. Quite likely that the findings of the left thalamus in the spinal cord are due to the same pathology. Electronically Signed   By: Nelson Chimes M.D.   On: 09/21/2015 20:41   Mr Jeri Cos Contrast  09/22/2015  CLINICAL DATA:  50 yo  female with his history of bilateral optic neuritis now with longitudinally extensive transverse myelitis. Weakness of arms and legs. EXAM: MRI CERVICAL SPINE WITH CONTRAST; MRI HEAD WITH CONTRAST TECHNIQUE: Multiplanar and multiecho pulse sequences of the cervical spine, to include the craniocervical junction and cervicothoracic junction, were obtained according to standard protocol with intravenous contrast.;  Multiplanar, multiecho pulse sequences of the brain and surrounding structures were obtained according to standard protocol with intravenous contrast CONTRAST:  74mL MULTIHANCE GADOBENATE DIMEGLUMINE 529 MG/ML IV SOLN COMPARISON:  Noncontrast brain and cervical spine from 07/21/2016. FINDINGS: Post infusion images of the brain demonstrates no abnormal enhancement. LEFT thalamic lesion, T2 and FLAIR hyperintense, probably represents an atypical area of demyelination, but non acute, given the lack of restricted diffusion and absence of blood brain barrier breakdown. In the cervical spine, there is extensive postcontrast enhancement, corresponding to the T2 and FLAIR hyperintensity on yesterday's exam, extending over multiple cervical segments, from the foramen magnum through C7. This enhancement involves both gray and white matter. There is greater involvement of the LEFT hemicord. In the setting of a patient with history of optic neuritis, the findings are consistent with Devic's disease. No features specific for optic neuritis can be observed on post infusion imaging through the brain, non dedicated orbit study. IMPRESSION: No abnormal intracranial enhancement is documented. Heterogeneous enhancement of the enlarged cervical cord segments, from foramen magnum through C7, involving both gray and white matter, consistent with Devic's disease. Electronically Signed   By: Staci Righter M.D.   On: 09/22/2015 12:39   Mr Cervical Spine Wo Contrast  09/21/2015  CLINICAL DATA:  Left arm numbness and pain. Neck  pain. Stroke versus cervical spine disease. EXAM: MRI HEAD WITHOUT CONTRAST MRI CERVICAL SPINE WITHOUT CONTRAST TECHNIQUE: Multiplanar, multiecho pulse sequences of the brain and surrounding structures, and cervical spine, to include the craniocervical junction and cervicothoracic junction, were obtained without intravenous contrast. COMPARISON:  Head CT earlier same day.  MRI 06/24/2014 FINDINGS: MRI HEAD FINDINGS There is a 1 cm region of T2 and FLAIR abnormality within the left thalamus. No restricted diffusion presently. On the old MRI, there appear to be an old small vessel insult in this location. Elsewhere, the cerebral hemispheres show a few punctate foci of T2 and FLAIR signal in the deep white matter. No cortical or large vessel territory insult. No mass lesion, hemorrhage, hydrocephalus or extra-axial collection. No pituitary mass. No inflammatory sinus disease. No skull or skullbase lesion. MRI CERVICAL SPINE FINDINGS The cervical spinal cord is diffusely abnormal showing enlargement and inter medullary T2 signal. This extends from the obex all the way to the C7 level. This is consistent with acute deep mild is a shin or myelitis. There are no degenerative changes. There is no compressive pathology. IMPRESSION: 1 cm region of abnormal T2 and FLAIR signal within the left thalamus not consistent with acute infarction. This could be an acute focus of demyelination or conceivably but less likely a late subacute infarction. Diffusely abnormal cervical spinal cord which is swollen and shows diffuse abnormal T2 signal. This is likely to represent acute demyelination/neuromyelitis optica (Devic's disease) or acute transverse myelitis. Quite likely that the findings of the left thalamus in the spinal cord are due to the same pathology. Electronically Signed   By: Nelson Chimes M.D.   On: 09/21/2015 20:41   Mr Cervical Spine W Contrast  09/22/2015  CLINICAL DATA:  50 yo female with his history of bilateral optic  neuritis now with longitudinally extensive transverse myelitis. Weakness of arms and legs. EXAM: MRI CERVICAL SPINE WITH CONTRAST; MRI HEAD WITH CONTRAST TECHNIQUE: Multiplanar and multiecho pulse sequences of the cervical spine, to include the craniocervical junction and cervicothoracic junction, were obtained according to standard protocol with intravenous contrast.; Multiplanar, multiecho pulse sequences of the brain and surrounding structures were obtained according to  standard protocol with intravenous contrast CONTRAST:  72mL MULTIHANCE GADOBENATE DIMEGLUMINE 529 MG/ML IV SOLN COMPARISON:  Noncontrast brain and cervical spine from 07/21/2016. FINDINGS: Post infusion images of the brain demonstrates no abnormal enhancement. LEFT thalamic lesion, T2 and FLAIR hyperintense, probably represents an atypical area of demyelination, but non acute, given the lack of restricted diffusion and absence of blood brain barrier breakdown. In the cervical spine, there is extensive postcontrast enhancement, corresponding to the T2 and FLAIR hyperintensity on yesterday's exam, extending over multiple cervical segments, from the foramen magnum through C7. This enhancement involves both gray and white matter. There is greater involvement of the LEFT hemicord. In the setting of a patient with history of optic neuritis, the findings are consistent with Devic's disease. No features specific for optic neuritis can be observed on post infusion imaging through the brain, non dedicated orbit study. IMPRESSION: No abnormal intracranial enhancement is documented. Heterogeneous enhancement of the enlarged cervical cord segments, from foramen magnum through C7, involving both gray and white matter, consistent with Devic's disease. Electronically Signed   By: Staci Righter M.D.   On: 09/22/2015 12:39   Ir Fluoro Guide Cv Line Right  09/23/2015  CLINICAL DATA:  Transverse myelitis.  Plasmapheresis. EXAM: TUNNELED DIALYSIS CATHETER  PLACEMENT, ULTRASOUND GUIDANCE FOR VASCULAR ACCESS FLUOROSCOPY TIME:  24 seconds MEDICATIONS AND MEDICAL HISTORY: Versed 1.5 mg, Fentanyl 75 mcg. Additional Medications: As antibiotic prophylaxis, Ancef was ordered pre-procedure and administered intravenously within one hour of incision. ANESTHESIA/SEDATION: Moderate sedation time: 20 minutes CONTRAST:  None PROCEDURE: The procedure, risks, benefits, and alternatives were explained to the patient. Questions regarding the procedure were encouraged and answered. The patient understands and consents to the procedure. The right neck was prepped with ChloraPrep in a sterile fashion, and a sterile drape was applied covering the operative field. A sterile gown and sterile gloves were used for the procedure. 1% lidocaine into the skin and subcutaneous tissue. The right jugular vein was noted to be patent initially with ultrasound. Under sonographic guidance, a micropuncture needle was inserted into the right IJ vein (Ultrasound and fluoroscopic image documentation was performed). It was removed over an 018 wire which was upsized to an Amplatz. This was advanced into the IVC. A small incision was made in the right upper chest. The tunneling device was utilized to advance the 23 centimeter tip to cuff catheter from the chest incision and out the neck incision. A peel-away sheath was advanced over the Amplatz wire. The leading edge of the catheter was then advanced through the peel-away sheath. The peel-away sheath was removed. It was flushed and instilled with heparin. The chest incision was closed with a 0 Prolene pursestring stitch. The neck incision was closed with a 4-0 Vicryl subcuticular stitch. COMPLICATIONS: None FINDINGS: The image demonstrates placement of a tunneled dialysis catheter with its tip in the right atrium. IMPRESSION: Successful right IJ vein tunneled dialysis catheter with its tip in the right atrium. Electronically Signed   By: Marybelle Killings M.D.   On:  09/23/2015 12:33   Ir US Guide Vasc Access Right  09/23/2015  CLINICAL DATA:  Transverse myelitis.  Plasmapheresis. EXAM: TUNNELED DIALYSIS CATHETER PLACEMENT, ULTRASOUND GUIDANCE FOR VASCULAR ACCESS FLUOROSCOPY TIME:  24 seconds MEDICATIONS AND MEDICAL HISTORY: Versed 1.5 mg, Fentanyl 75 mcg. Additional Medications: As antibiotic prophylaxis, Ancef was ordered pre-procedure and administered intravenously within one hour of incision. ANESTHESIA/SEDATION: Moderate sedation time: 20 minutes CONTRAST:  None PROCEDURE: The procedure, risks, benefits, and alternatives were explained to the  patient. Questions regarding the procedure were encouraged and answered. The patient understands and consents to the procedure. The right neck was prepped with ChloraPrep in a sterile fashion, and a sterile drape was applied covering the operative field. A sterile gown and sterile gloves were used for the procedure. 1% lidocaine into the skin and subcutaneous tissue. The right jugular vein was noted to be patent initially with ultrasound. Under sonographic guidance, a micropuncture needle was inserted into the right IJ vein (Ultrasound and fluoroscopic image documentation was performed). It was removed over an 018 wire which was upsized to an Amplatz. This was advanced into the IVC. A small incision was made in the right upper chest. The tunneling device was utilized to advance the 23 centimeter tip to cuff catheter from the chest incision and out the neck incision. A peel-away sheath was advanced over the Amplatz wire. The leading edge of the catheter was then advanced through the peel-away sheath. The peel-away sheath was removed. It was flushed and instilled with heparin. The chest incision was closed with a 0 Prolene pursestring stitch. The neck incision was closed with a 4-0 Vicryl subcuticular stitch. COMPLICATIONS: None FINDINGS: The image demonstrates placement of a tunneled dialysis catheter with its tip in the right  atrium. IMPRESSION: Successful right IJ vein tunneled dialysis catheter with its tip in the right atrium. Electronically Signed   By: Marybelle Killings M.D.   On: 09/23/2015 12:33   Dg Fluoro Guide Lumbar Puncture  09/22/2015  CLINICAL DATA:  Transverse myelitis. EXAM: DIAGNOSTIC LUMBAR PUNCTURE UNDER FLUOROSCOPIC GUIDANCE FLUOROSCOPY TIME:  Fluoroscopy Time (in minutes and seconds): 0 minutes, 37 seconds Number of Acquired Images:  0 PROCEDURE: I discussed the risks (including hemorrhage, infection, headache, and nerve damage, among others), benefits, and alternatives to fluoroscopically guided lumbar puncture with the patient. We specifically discussed the high technical likelihood of success of the procedure. The patient understood and elected to undergo the procedure. Standard time-out was employed. Following sterile skin prep and local anesthetic administration consisting of 1 percent lidocaine, a 22 gauge spinal needle was advanced without difficulty into the thecal sac at the at the L4-5 level. Clear CSF was returned. Opening pressure was 17 cm of water. 13 cc of clear CSF was collected. The needle was subsequently removed and the skin cleansed and bandaged. No immediate complications were observed. IMPRESSION: 1. Technically successful fluoroscopically guided lumbar puncture at the L4-5 level. No complicating feature observed. Opening pressure 17 cm of water. Electronically Signed   By: Van Clines M.D.   On: 09/22/2015 09:15    CBC  Recent Labs Lab 09/26/15 0401  09/27/15 0710 09/28/15 0430 09/28/15 1002 09/29/15 0716 09/30/15 0332  WBC 12.5*  --  10.6* 12.1*  --  10.1 8.9  HGB 11.8*  < > 12.0 11.4* 12.6 11.0* 11.2*  HCT 36.4  < > 37.4 35.3* 37.0 33.8* 34.7*  PLT 146*  --  144* 153  --  120* 132*  MCV 71.9*  --  72.1* 72.2*  --  72.4* 72.1*  MCH 23.3*  --  23.1* 23.3*  --  23.6* 23.3*  MCHC 32.4  --  32.1 32.3  --  32.5 32.3  RDW 14.8  --  14.4 14.4  --  14.5 14.3  LYMPHSABS 0.7   --  0.6* 0.8  --  1.4 2.0  MONOABS 0.2  --  0.2 0.7  --  0.8 0.6  EOSABS 0.0  --  0.0 0.0  --  0.0 0.0  BASOSABS 0.0  --  0.0 0.0  --  0.0 0.0  < > = values in this interval not displayed.  Chemistries   Recent Labs Lab 09/23/15 1553 09/24/15 0415 09/24/15 1600 09/26/15 1258 09/28/15 0430 09/28/15 1002  NA 140 141 143 142  --  142  K 4.3 4.7 4.5 4.0  --  3.5  CL 109 117* 110 106  --  103  CO2  --  19*  --   --   --   --   GLUCOSE 202* 132* 126* 160*  --  159*  BUN 12 13 17 19   --  21*  CREATININE 0.60 0.69 0.70 0.60 0.69 0.70  CALCIUM  --  8.8*  --   --   --   --   AST  --  21  --   --   --   --   ALT  --  18  --   --   --   --   ALKPHOS  --  22*  --   --   --   --   BILITOT  --  0.5  --   --   --   --    ------------------------------------------------------------------------------------------------------------------ estimated creatinine clearance is 87.3 mL/min (by C-G formula based on Cr of 0.7). ------------------------------------------------------------------------------------------------------------------ No results for input(s): HGBA1C in the last 72 hours. ------------------------------------------------------------------------------------------------------------------ No results for input(s): CHOL, HDL, LDLCALC, TRIG, CHOLHDL, LDLDIRECT in the last 72 hours. ------------------------------------------------------------------------------------------------------------------  Recent Labs  09/27/15 1340  TSH 0.273*   ------------------------------------------------------------------------------------------------------------------ No results for input(s): VITAMINB12, FOLATE, FERRITIN, TIBC, IRON, RETICCTPCT in the last 72 hours.  Coagulation profile  Recent Labs Lab 09/26/15 0850  INR 1.36    No results for input(s): DDIMER in the last 72 hours.  Cardiac Enzymes  Recent Labs Lab 09/28/15 2105 09/29/15 0716 09/29/15 1425  TROPONINI 0.04* <0.03 <0.03    ------------------------------------------------------------------------------------------------------------------ Invalid input(s): POCBNP   Recent Labs  09/29/15 0718 09/29/15 1142 09/29/15 1638 09/29/15 2105 09/30/15 0638 09/30/15 1141  GLUCAP 172* 106* 117* 119* 100* 126*     Shanena Pellegrino M.D. [facility] 09/30/2015, 12:51 PM  Pager: [phone] Between 7am to 7pm - call Pager - [phone]  After 7pm go to [email or web address] - password [email or web address]  Call night coverage person covering after 7pm

## 2015-09-30 NOTE — Progress Notes (Signed)
Paged PA to notify that patient's niece wanted him to call her for up date. PA called back and said he spoke at length with patient and son this morning.

## 2015-09-30 NOTE — Progress Notes (Signed)
Subjective: Patient and son in the room. Patient has had no improvement on th left side. Son is frustrated.   Objective: Current vital signs: BP 141/80 mmHg  Pulse 98  Temp(Src) 98.8 F (37.1 C) (Oral)  Resp 20  Ht 5\' 1"  (1.549 m)  Wt 87.272 kg (192 lb 6.4 oz)  BMI 36.37 kg/m2  SpO2 98% Vital signs in last 24 hours: Temp:  [98.5 F (36.9 C)-99 F (37.2 C)] 98.8 F (37.1 C) (01/20 0928) Pulse Rate:  [80-102] 98 (01/20 0928) Resp:  [18-20] 20 (01/20 0928) BP: (132-152)/(75-101) 141/80 mmHg (01/20 0928) SpO2:  [96 %-100 %] 98 % (01/20 0928) Weight:  [87.272 kg (192 lb 6.4 oz)] 87.272 kg (192 lb 6.4 oz) (01/20 0500)  Intake/Output from previous day: 01/19 0701 - 01/20 0700 In: 120 [P.O.:120] Out: -  Intake/Output this shift:   Nutritional status: Diet renal/carb modified with fluid restriction Diet-HS Snack?: Nothing; Room service appropriate?: Yes; Fluid consistency:: Thin  Neurologic Exam: General: NAD Mental Status: Alert, oriented, thought content appropriate.  Speech fluent without evidence of aphasia.  Able to follow 3 step commands without difficulty. Cranial Nerves: II: Discs flat bilaterally; Visual fields grossly normal, pupils equal, round, reactive to light and accommodation III,IV, VI: ptosis not present, extra-ocular motions intact bilaterally V,VII: smile symmetric, facial light touch sensation normal bilaterally VIII: hearing normal bilaterally IX,X: uvula rises symmetrically XI: bilateral shoulder shrug XII: midline tongue extension without atrophy or fasciculations  Motor: Right : Upper extremity   5/5    Left:     Upper extremity   0/5  Lower extremity   5/5     Lower extremity   0/5 Tone and bulk:normal tone throughout; no atrophy noted Sensory: Pinprick and light touch decreased on the left Deep Tendon Reflexes:  Right: Upper Extremity   Left: Upper extremity   biceps (C-5 to C-6) 2/4   biceps (C-5 to C-6) 1/4 tricep (C7) 2/4    triceps (C7)  1/4 Brachioradialis (C6) 2/4  Brachioradialis (C6) 1/4  Lower Extremity Lower Extremity  quadriceps (L-2 to L-4) 2/4   quadriceps (L-2 to L-4) 1/4 Achilles (S1) 2/4   Achilles (S1) 0/4  Plantars: Right: downgoing   Left: downgoing Cerebellar: normal finger-to-nose,  normal heel-to-shin test on the right     Lab Results: Basic Metabolic Panel:  Recent Labs Lab 09/23/15 1553 09/24/15 0415 09/24/15 1600 09/26/15 1258 09/28/15 0430 09/28/15 1002  NA 140 141 143 142  --  142  K 4.3 4.7 4.5 4.0  --  3.5  CL 109 117* 110 106  --  103  CO2  --  19*  --   --   --   --   GLUCOSE 202* 132* 126* 160*  --  159*  BUN 12 13 17 19   --  21*  CREATININE 0.60 0.69 0.70 0.60 0.69 0.70  CALCIUM  --  8.8*  --   --   --   --     Liver Function Tests:  Recent Labs Lab 09/24/15 0415  AST 21  ALT 18  ALKPHOS 22*  BILITOT 0.5  PROT 5.7*  ALBUMIN 4.0   No results for input(s): LIPASE, AMYLASE in the last 168 hours. No results for input(s): AMMONIA in the last 168 hours.  CBC:  Recent Labs Lab 09/26/15 0401  09/27/15 0710 09/28/15 0430 09/28/15 1002 09/29/15 0716 09/30/15 0332  WBC 12.5*  --  10.6* 12.1*  --  10.1 8.9  NEUTROABS 11.7*  --  9.8* 10.6*  --  7.9* 6.3  HGB 11.8*  < > 12.0 11.4* 12.6 11.0* 11.2*  HCT 36.4  < > 37.4 35.3* 37.0 33.8* 34.7*  MCV 71.9*  --  72.1* 72.2*  --  72.4* 72.1*  PLT 146*  --  144* 153  --  120* 132*  < > = values in this interval not displayed.  Cardiac Enzymes:  Recent Labs Lab 09/28/15 1400 09/28/15 2105 09/29/15 0716 09/29/15 1425  TROPONINI <0.03 0.04* <0.03 <0.03    Lipid Panel: No results for input(s): CHOL, TRIG, HDL, CHOLHDL, VLDL, LDLCALC in the last 168 hours.  CBG:  Recent Labs Lab 09/29/15 0718 09/29/15 1142 09/29/15 1638 09/29/15 2105 09/30/15 0638  GLUCAP 172* 106* 117* 119* 100*    Microbiology: Results for orders placed or performed during the hospital encounter of 09/21/15  CSF culture     Status:  None   Collection Time: 09/22/15  8:57 AM  Result Value Ref Range Status   Specimen Description CSF TUBE 2  Final   Special Requests NONE  Final   Gram Stain   Final    WBC PRESENT,BOTH PMN AND MONONUCLEAR NO ORGANISMS SEEN CYTOSPIN SMEAR    Culture NO GROWTH 3 DAYS  Final   Report Status 09/25/2015 FINAL  Final    Coagulation Studies: No results for input(s): LABPROT, INR in the last 72 hours.  Imaging: No results found.  Medications:  Scheduled: .  stroke: mapping our early stages of recovery book   Does not apply Once  . therapeutic plasma exchange solution   Dialysis Q1 Hr x 2  . azaTHIOprine  50 mg Oral Daily  . calcium gluconate IVPB  2 g Intravenous Once  . calcium gluconate IVPB  2 g Intravenous Once  . citrate dextrose      . enoxaparin (LOVENOX) injection  40 mg Subcutaneous Q0600  . heparin  1,000 Units Intracatheter Once  . [START ON 10/01/2015] Immune Globulin 10%  400 mg/kg Intravenous Q24 Hr x 5  . insulin aspart  0-15 Units Subcutaneous TID WC  . insulin aspart  0-5 Units Subcutaneous QHS  . multivitamin with minerals  1 tablet Oral Daily  . pantoprazole  40 mg Oral Daily  . predniSONE  40 mg Oral Q breakfast  . pregabalin  75 mg Oral TID  . sodium chloride  3 mL Intravenous Q12H  . sodium chloride  3 mL Intravenous Q12H    Assessment/Plan: 50 YO female with SLE and newly diagnosed NMO. Today will be last day of PLX. Patient is not showing any improvement and family is frustrated. Long discussion with patient and son.  It was explained to the the process of the disease and that she may or may not get improvement over time. At this time, I suggested a trial of IVIG  for 5 days but emphasized to them the fact that following IVIG she will certainly need immunosuppression with Rituximab or cyclophosphamide but this is not offered here. She will need to go to either a tertiary center or outpatient neurology/reumathology to pursue this strategy.  They agreed with  this course of action. Will continue to follow.   Etta Quill PA-C [facility]   09/30/2015, 9:48 AM   Patient seen and examined together with physician assistant and I concur with the assessment and plan.  Dorian Pod, MD

## 2015-09-30 NOTE — Progress Notes (Signed)
Report given to Debroah Baller, RN in order for Pt to go to dialysis for Tx

## 2015-09-30 NOTE — Progress Notes (Signed)
Attempted to get pt for TPE x2. Will try back.

## 2015-09-30 NOTE — Progress Notes (Signed)
Called dialysis to see if patient is still going for TPE, was told they are backed but she is on the list for today.

## 2015-10-01 LAB — CBC WITH DIFFERENTIAL/PLATELET
BASOS ABS: 0 10*3/uL (ref 0.0–0.1)
BASOS PCT: 0 %
Eosinophils Absolute: 0.1 10*3/uL (ref 0.0–0.7)
Eosinophils Relative: 1 %
HEMATOCRIT: 33.8 % — AB (ref 36.0–46.0)
HEMOGLOBIN: 11.1 g/dL — AB (ref 12.0–15.0)
LYMPHS PCT: 28 %
Lymphs Abs: 2.5 10*3/uL (ref 0.7–4.0)
MCH: 23.6 pg — ABNORMAL LOW (ref 26.0–34.0)
MCHC: 32.8 g/dL (ref 30.0–36.0)
MCV: 71.8 fL — AB (ref 78.0–100.0)
MONO ABS: 0.7 10*3/uL (ref 0.1–1.0)
MONOS PCT: 8 %
NEUTROS ABS: 5.7 10*3/uL (ref 1.7–7.7)
NEUTROS PCT: 63 %
Platelets: 147 10*3/uL — ABNORMAL LOW (ref 150–400)
RBC: 4.71 MIL/uL (ref 3.87–5.11)
RDW: 14.3 % (ref 11.5–15.5)
WBC: 9 10*3/uL (ref 4.0–10.5)

## 2015-10-01 LAB — GLUCOSE, CAPILLARY
GLUCOSE-CAPILLARY: 97 mg/dL (ref 65–99)
GLUCOSE-CAPILLARY: 160 mg/dL — AB (ref 65–99)
GLUCOSE-CAPILLARY: 178 mg/dL — AB (ref 65–99)
GLUCOSE-CAPILLARY: 110 mg/dL — AB (ref 65–99)
Glucose-Capillary: 93 mg/dL (ref 65–99)

## 2015-10-01 LAB — POCT I-STAT, CHEM 8
BUN: 12 mg/dL (ref 6–20)
CALCIUM ION: 1.24 mmol/L — AB (ref 1.12–1.23)
CHLORIDE: 101 mmol/L (ref 101–111)
Creatinine, Ser: 0.8 mg/dL (ref 0.44–1.00)
Glucose, Bld: 92 mg/dL (ref 65–99)
HCT: 36 % (ref 36.0–46.0)
Hemoglobin: 12.2 g/dL (ref 12.0–15.0)
POTASSIUM: 3.3 mmol/L — AB (ref 3.5–5.1)
SODIUM: 143 mmol/L (ref 135–145)
TCO2: 26 mmol/L (ref 0–100)

## 2015-10-01 NOTE — Progress Notes (Signed)
[facility]                                                                              Patient Demographics  Andrea Berry, is a 50 y.o. female, DOB - 07/19/1966, [id number]  Admit date - 09/21/2015   Admitting Physician Reubin Milan, MD  Outpatient Primary MD for the patient is [name], NP  LOS - 9   Chief Complaint  Patient presents with  . Extremity Weakness       Brief HPI   Pleasant 50 year old African-American female with history of CVA in the past, hypertension, GERD, anxiety, depression, lupus with legal blindness, who is chronically on prednisone presented to the hospital with chief complaints of left-sided weakness are more than leg, in the ER she was seen by neurology and workup was consistent with Devic's disease. She was started on IV Solu-Medrol along with plasma exchange and admitted to [facility] service. She has minimal improvement in her left arm strength, plan to complete her plasma exchange treatments on 09/30/2015    Assessment & Plan   Severe left arm weakness ongoing for 3 days PTA. Underlying history of legal blindness. Secondary to Devic's disease - Per patient, no significant improvement with Solu-Medrol and the plasmapheresis - Neurology consulted. Patient was placed on IV Solu-Medrol total of 5 doses stop date 09/26/2015 and due for plasma exchange every other day for a total 5 runs to be finished on 09/30/2015 as directed by neurology.  - Further management , deferred to neurology.  - Continue supportive care with PT OT - Patient was seen by neurology again on 1/20, at this time recommending IVIG, starting 1/21.    Transient mild Hypotension with bradycardia, chest pain - resolved, no complaints of CP today or hypotension - prednisone 40 mg daily, slowly taper to 5 mg maintenance dose. - EKG showed anterolateral T-wave inversions, similar to prior EKG on 1/16 and 1/11. 2-D echo on 1/12 had shown EF of 65-70%, grade 1  diastolic dysfunction   History of lupus. On prednisone at home - Patient was placed on IV Solu-Medrol, along with Imuran. Outpatient follow-up with Dr. Estanislado Pandy postdischarge. -Continue prednisone and taper slowly to maintenance dose of 5 mg   History of TIA/stroke in the past. No acute issues.   Prolonged QTC upon admission. IV magnesium, low-dose beta blocker, monitor on telemetry. Resolved on repeat EKG   Dehydration with tachycardia. Hydrated, and improved, stable echogram.   GERD. On PPI.  Leuokocytosis due to steroids. Afebrile monitor.  Low TSH. Could be sick euthyroid, repeat TSH along with T3 and free T4.   Steroid induced hyperglycemia - Place on sliding scale insulin  Code Status: Full CODE STATUS  Family Communication: Discussed in detail with the patient, all imaging results, lab results explained to the patient and patient's son, [name] at the bedside   Disposition Plan:   Time Spent in minutes  15 minutes  Procedures   CT head, MRI brain and C-spine consistent with Devic's disease.  Lumbar puncture not consistent with meningitis   Right IJ venous dialysis catheter placed by IR on 09/23/2015  TTE   -  Left ventricle: The cavity size was normal. Wall thickness wasincreased in a pattern of moderate to severe LVH. Systolicfunction was vigorous. The estimated ejection fraction was in the range of 65% to 70%. There was dynamic obstruction in the mid cavity, with a peak velocity of 314 cm/sec and a peak gradient of39 mm Hg. Wall motion was normal; there were no regional wall motion abnormalities. Doppler parameters are consistent with abnormal left ventricular relaxation (grade 1 diastolic dysfunction). - Left atrium: The atrium was mildly dilated. - Pericardium, extracardiac: A small pericardial effusion was identified circumferential to the heart  Consults   Neurology  DVT Prophylaxis  Lovenox  Medications  Scheduled Meds: .  stroke: mapping our early  stages of recovery book   Does not apply Once  . azaTHIOprine  50 mg Oral Daily  . enoxaparin (LOVENOX) injection  40 mg Subcutaneous Q0600  . Immune Globulin 10%  400 mg/kg Intravenous Q24 Hr x 5  . insulin aspart  0-15 Units Subcutaneous TID WC  . insulin aspart  0-5 Units Subcutaneous QHS  . multivitamin with minerals  1 tablet Oral Daily  . pantoprazole  40 mg Oral Daily  . predniSONE  40 mg Oral Q breakfast  . pregabalin  75 mg Oral TID  . sodium chloride  3 mL Intravenous Q12H  . sodium chloride  3 mL Intravenous Q12H   Continuous Infusions:   PRN Meds:.sodium chloride, bisacodyl, LORazepam, morphine injection, nitroGLYCERIN, ondansetron **OR** ondansetron (ZOFRAN) IV, oxyCODONE, senna-docusate, sodium chloride   Antibiotics   Anti-infectives    Start     Dose/Rate Route Frequency Ordered Stop   09/23/15 0815  ceFAZolin (ANCEF) IVPB 2 g/50 mL premix     2 g 100 mL/hr over 30 Minutes Intravenous  Once 09/23/15 0814 09/23/15 [id number]        Subjective:   Andrea Berry was seen and examined today.  Persisting left side weakness. Minimal improvement. Starting IVIG today. Patient  denies any chest pain, shortness of breath, dizziness abdominal pain, N/V/D/C. No fevers or chills. Son at the bedside  Objective:   Blood pressure 123/58, pulse 102, temperature 98.7 F (37.1 C), temperature source Oral, resp. rate 16, height 5\' 1"  (1.549 m), weight 87.272 kg (192 lb 6.4 oz), SpO2 95 %.  Wt Readings from Last 3 Encounters:  09/30/15 87.272 kg (192 lb 6.4 oz)  02/08/14 73.199 kg (161 lb 6 oz)  01/29/14 75.297 kg (166 lb)    No intake or output data in the 24 hours ending 10/01/15 1128  Exam  General: Alert and oriented x 3, NAD  HEENT:  PERRLA, EOMI  Neck: Supple  CVS: S1 S2clear, RRR  Respiratory: CTAB  Abdomen: Soft, nontender, nondistended, + bowel sounds  Ext: no cyanosis clubbing or edema  Neuro: left arm strength 1/5, left leg 0/5, legally blind  Skin: No  rashes  Psych: Normal affect and demeanor, alert and oriented x3    Data Review   Micro Results Recent Results (from the past 240 hour(s))  CSF culture     Status: None   Collection Time: 09/22/15  8:57 AM  Result Value Ref Range Status   Specimen Description CSF TUBE 2  Final   Special Requests NONE  Final   Gram Stain   Final    WBC PRESENT,BOTH PMN AND MONONUCLEAR NO ORGANISMS SEEN CYTOSPIN SMEAR    Culture NO GROWTH 3 DAYS  Final   Report Status 09/25/2015 FINAL  Final    Radiology Reports Dg  Chest 2 View  09/22/2015  CLINICAL DATA:  TIA. EXAM: CHEST  2 VIEW COMPARISON:  04/29/2012 FINDINGS: Lung volumes are low. Cardiomegaly is unchanged allowing for differences in technique. Minimal bibasilar subsegmental atelectasis no pulmonary edema, consolidation, pleural effusion or pneumothorax. No acute osseous abnormalities are seen. IMPRESSION: Low lung volumes with stable cardiomegaly. Electronically Signed   By: Jeb Levering M.D.   On: 09/22/2015 03:36   Ct Head Wo Contrast  09/21/2015  CLINICAL DATA:  Acute left-sided weakness. EXAM: CT HEAD WITHOUT CONTRAST TECHNIQUE: Contiguous axial images were obtained from the base of the skull through the vertex without intravenous contrast. COMPARISON:  CT scan of June 24, 2014. FINDINGS: Bony calvarium appears intact. No mass effect or midline shift is noted. Ventricular size is within normal limits. There is no evidence of mass lesion, hemorrhage or acute infarction. IMPRESSION: Normal head CT. Electronically Signed   By: Marijo Conception, M.D.   On: 09/21/2015 16:27   Mr Brain Wo Contrast  09/21/2015  CLINICAL DATA:  Left arm numbness and pain. Neck pain. Stroke versus cervical spine disease. EXAM: MRI HEAD WITHOUT CONTRAST MRI CERVICAL SPINE WITHOUT CONTRAST TECHNIQUE: Multiplanar, multiecho pulse sequences of the brain and surrounding structures, and cervical spine, to include the craniocervical junction and cervicothoracic junction,  were obtained without intravenous contrast. COMPARISON:  Head CT earlier same day.  MRI 06/24/2014 FINDINGS: MRI HEAD FINDINGS There is a 1 cm region of T2 and FLAIR abnormality within the left thalamus. No restricted diffusion presently. On the old MRI, there appear to be an old small vessel insult in this location. Elsewhere, the cerebral hemispheres show a few punctate foci of T2 and FLAIR signal in the deep white matter. No cortical or large vessel territory insult. No mass lesion, hemorrhage, hydrocephalus or extra-axial collection. No pituitary mass. No inflammatory sinus disease. No skull or skullbase lesion. MRI CERVICAL SPINE FINDINGS The cervical spinal cord is diffusely abnormal showing enlargement and inter medullary T2 signal. This extends from the obex all the way to the C7 level. This is consistent with acute deep mild is a shin or myelitis. There are no degenerative changes. There is no compressive pathology. IMPRESSION: 1 cm region of abnormal T2 and FLAIR signal within the left thalamus not consistent with acute infarction. This could be an acute focus of demyelination or conceivably but less likely a late subacute infarction. Diffusely abnormal cervical spinal cord which is swollen and shows diffuse abnormal T2 signal. This is likely to represent acute demyelination/neuromyelitis optica (Devic's disease) or acute transverse myelitis. Quite likely that the findings of the left thalamus in the spinal cord are due to the same pathology. Electronically Signed   By: Nelson Chimes M.D.   On: 09/21/2015 20:41   Mr Jeri Cos Contrast  09/22/2015  CLINICAL DATA:  50 yo female with his history of bilateral optic neuritis now with longitudinally extensive transverse myelitis. Weakness of arms and legs. EXAM: MRI CERVICAL SPINE WITH CONTRAST; MRI HEAD WITH CONTRAST TECHNIQUE: Multiplanar and multiecho pulse sequences of the cervical spine, to include the craniocervical junction and cervicothoracic junction,  were obtained according to standard protocol with intravenous contrast.; Multiplanar, multiecho pulse sequences of the brain and surrounding structures were obtained according to standard protocol with intravenous contrast CONTRAST:  14mL MULTIHANCE GADOBENATE DIMEGLUMINE 529 MG/ML IV SOLN COMPARISON:  Noncontrast brain and cervical spine from 07/21/2016. FINDINGS: Post infusion images of the brain demonstrates no abnormal enhancement. LEFT thalamic lesion, T2 and FLAIR hyperintense, probably represents  an atypical area of demyelination, but non acute, given the lack of restricted diffusion and absence of blood brain barrier breakdown. In the cervical spine, there is extensive postcontrast enhancement, corresponding to the T2 and FLAIR hyperintensity on yesterday's exam, extending over multiple cervical segments, from the foramen magnum through C7. This enhancement involves both gray and white matter. There is greater involvement of the LEFT hemicord. In the setting of a patient with history of optic neuritis, the findings are consistent with Devic's disease. No features specific for optic neuritis can be observed on post infusion imaging through the brain, non dedicated orbit study. IMPRESSION: No abnormal intracranial enhancement is documented. Heterogeneous enhancement of the enlarged cervical cord segments, from foramen magnum through C7, involving both gray and white matter, consistent with Devic's disease. Electronically Signed   By: Staci Righter M.D.   On: 09/22/2015 12:39   Mr Cervical Spine Wo Contrast  09/21/2015  CLINICAL DATA:  Left arm numbness and pain. Neck pain. Stroke versus cervical spine disease. EXAM: MRI HEAD WITHOUT CONTRAST MRI CERVICAL SPINE WITHOUT CONTRAST TECHNIQUE: Multiplanar, multiecho pulse sequences of the brain and surrounding structures, and cervical spine, to include the craniocervical junction and cervicothoracic junction, were obtained without intravenous contrast.  COMPARISON:  Head CT earlier same day.  MRI 06/24/2014 FINDINGS: MRI HEAD FINDINGS There is a 1 cm region of T2 and FLAIR abnormality within the left thalamus. No restricted diffusion presently. On the old MRI, there appear to be an old small vessel insult in this location. Elsewhere, the cerebral hemispheres show a few punctate foci of T2 and FLAIR signal in the deep white matter. No cortical or large vessel territory insult. No mass lesion, hemorrhage, hydrocephalus or extra-axial collection. No pituitary mass. No inflammatory sinus disease. No skull or skullbase lesion. MRI CERVICAL SPINE FINDINGS The cervical spinal cord is diffusely abnormal showing enlargement and inter medullary T2 signal. This extends from the obex all the way to the C7 level. This is consistent with acute deep mild is a shin or myelitis. There are no degenerative changes. There is no compressive pathology. IMPRESSION: 1 cm region of abnormal T2 and FLAIR signal within the left thalamus not consistent with acute infarction. This could be an acute focus of demyelination or conceivably but less likely a late subacute infarction. Diffusely abnormal cervical spinal cord which is swollen and shows diffuse abnormal T2 signal. This is likely to represent acute demyelination/neuromyelitis optica (Devic's disease) or acute transverse myelitis. Quite likely that the findings of the left thalamus in the spinal cord are due to the same pathology. Electronically Signed   By: Nelson Chimes M.D.   On: 09/21/2015 20:41   Mr Cervical Spine W Contrast  09/22/2015  CLINICAL DATA:  50 yo female with his history of bilateral optic neuritis now with longitudinally extensive transverse myelitis. Weakness of arms and legs. EXAM: MRI CERVICAL SPINE WITH CONTRAST; MRI HEAD WITH CONTRAST TECHNIQUE: Multiplanar and multiecho pulse sequences of the cervical spine, to include the craniocervical junction and cervicothoracic junction, were obtained according to standard  protocol with intravenous contrast.; Multiplanar, multiecho pulse sequences of the brain and surrounding structures were obtained according to standard protocol with intravenous contrast CONTRAST:  66mL MULTIHANCE GADOBENATE DIMEGLUMINE 529 MG/ML IV SOLN COMPARISON:  Noncontrast brain and cervical spine from 07/21/2016. FINDINGS: Post infusion images of the brain demonstrates no abnormal enhancement. LEFT thalamic lesion, T2 and FLAIR hyperintense, probably represents an atypical area of demyelination, but non acute, given the lack of restricted diffusion  and absence of blood brain barrier breakdown. In the cervical spine, there is extensive postcontrast enhancement, corresponding to the T2 and FLAIR hyperintensity on yesterday's exam, extending over multiple cervical segments, from the foramen magnum through C7. This enhancement involves both gray and white matter. There is greater involvement of the LEFT hemicord. In the setting of a patient with history of optic neuritis, the findings are consistent with Devic's disease. No features specific for optic neuritis can be observed on post infusion imaging through the brain, non dedicated orbit study. IMPRESSION: No abnormal intracranial enhancement is documented. Heterogeneous enhancement of the enlarged cervical cord segments, from foramen magnum through C7, involving both gray and white matter, consistent with Devic's disease. Electronically Signed   By: Staci Righter M.D.   On: 09/22/2015 12:39   Ir Fluoro Guide Cv Line Right  09/23/2015  CLINICAL DATA:  Transverse myelitis.  Plasmapheresis. EXAM: TUNNELED DIALYSIS CATHETER PLACEMENT, ULTRASOUND GUIDANCE FOR VASCULAR ACCESS FLUOROSCOPY TIME:  24 seconds MEDICATIONS AND MEDICAL HISTORY: Versed 1.5 mg, Fentanyl 75 mcg. Additional Medications: As antibiotic prophylaxis, Ancef was ordered pre-procedure and administered intravenously within one hour of incision. ANESTHESIA/SEDATION: Moderate sedation time: 20 minutes  CONTRAST:  None PROCEDURE: The procedure, risks, benefits, and alternatives were explained to the patient. Questions regarding the procedure were encouraged and answered. The patient understands and consents to the procedure. The right neck was prepped with ChloraPrep in a sterile fashion, and a sterile drape was applied covering the operative field. A sterile gown and sterile gloves were used for the procedure. 1% lidocaine into the skin and subcutaneous tissue. The right jugular vein was noted to be patent initially with ultrasound. Under sonographic guidance, a micropuncture needle was inserted into the right IJ vein (Ultrasound and fluoroscopic image documentation was performed). It was removed over an 018 wire which was upsized to an Amplatz. This was advanced into the IVC. A small incision was made in the right upper chest. The tunneling device was utilized to advance the 23 centimeter tip to cuff catheter from the chest incision and out the neck incision. A peel-away sheath was advanced over the Amplatz wire. The leading edge of the catheter was then advanced through the peel-away sheath. The peel-away sheath was removed. It was flushed and instilled with heparin. The chest incision was closed with a 0 Prolene pursestring stitch. The neck incision was closed with a 4-0 Vicryl subcuticular stitch. COMPLICATIONS: None FINDINGS: The image demonstrates placement of a tunneled dialysis catheter with its tip in the right atrium. IMPRESSION: Successful right IJ vein tunneled dialysis catheter with its tip in the right atrium. Electronically Signed   By: Marybelle Killings M.D.   On: 09/23/2015 12:33   Ir US Guide Vasc Access Right  09/23/2015  CLINICAL DATA:  Transverse myelitis.  Plasmapheresis. EXAM: TUNNELED DIALYSIS CATHETER PLACEMENT, ULTRASOUND GUIDANCE FOR VASCULAR ACCESS FLUOROSCOPY TIME:  24 seconds MEDICATIONS AND MEDICAL HISTORY: Versed 1.5 mg, Fentanyl 75 mcg. Additional Medications: As antibiotic  prophylaxis, Ancef was ordered pre-procedure and administered intravenously within one hour of incision. ANESTHESIA/SEDATION: Moderate sedation time: 20 minutes CONTRAST:  None PROCEDURE: The procedure, risks, benefits, and alternatives were explained to the patient. Questions regarding the procedure were encouraged and answered. The patient understands and consents to the procedure. The right neck was prepped with ChloraPrep in a sterile fashion, and a sterile drape was applied covering the operative field. A sterile gown and sterile gloves were used for the procedure. 1% lidocaine into the skin and subcutaneous tissue. The  right jugular vein was noted to be patent initially with ultrasound. Under sonographic guidance, a micropuncture needle was inserted into the right IJ vein (Ultrasound and fluoroscopic image documentation was performed). It was removed over an 018 wire which was upsized to an Amplatz. This was advanced into the IVC. A small incision was made in the right upper chest. The tunneling device was utilized to advance the 23 centimeter tip to cuff catheter from the chest incision and out the neck incision. A peel-away sheath was advanced over the Amplatz wire. The leading edge of the catheter was then advanced through the peel-away sheath. The peel-away sheath was removed. It was flushed and instilled with heparin. The chest incision was closed with a 0 Prolene pursestring stitch. The neck incision was closed with a 4-0 Vicryl subcuticular stitch. COMPLICATIONS: None FINDINGS: The image demonstrates placement of a tunneled dialysis catheter with its tip in the right atrium. IMPRESSION: Successful right IJ vein tunneled dialysis catheter with its tip in the right atrium. Electronically Signed   By: Marybelle Killings M.D.   On: 09/23/2015 12:33   Dg Fluoro Guide Lumbar Puncture  09/22/2015  CLINICAL DATA:  Transverse myelitis. EXAM: DIAGNOSTIC LUMBAR PUNCTURE UNDER FLUOROSCOPIC GUIDANCE FLUOROSCOPY TIME:   Fluoroscopy Time (in minutes and seconds): 0 minutes, 37 seconds Number of Acquired Images:  0 PROCEDURE: I discussed the risks (including hemorrhage, infection, headache, and nerve damage, among others), benefits, and alternatives to fluoroscopically guided lumbar puncture with the patient. We specifically discussed the high technical likelihood of success of the procedure. The patient understood and elected to undergo the procedure. Standard time-out was employed. Following sterile skin prep and local anesthetic administration consisting of 1 percent lidocaine, a 22 gauge spinal needle was advanced without difficulty into the thecal sac at the at the L4-5 level. Clear CSF was returned. Opening pressure was 17 cm of water. 13 cc of clear CSF was collected. The needle was subsequently removed and the skin cleansed and bandaged. No immediate complications were observed. IMPRESSION: 1. Technically successful fluoroscopically guided lumbar puncture at the L4-5 level. No complicating feature observed. Opening pressure 17 cm of water. Electronically Signed   By: Van Clines M.D.   On: 09/22/2015 09:15    CBC  Recent Labs Lab 09/27/15 0710 09/28/15 0430 09/28/15 1002 09/29/15 0716 09/30/15 0332 09/30/15 2241 10/01/15 0501  WBC 10.6* 12.1*  --  10.1 8.9  --  9.0  HGB 12.0 11.4* 12.6 11.0* 11.2* 12.2 11.1*  HCT 37.4 35.3* 37.0 33.8* 34.7* 36.0 33.8*  PLT 144* 153  --  120* 132*  --  147*  MCV 72.1* 72.2*  --  72.4* 72.1*  --  71.8*  MCH 23.1* 23.3*  --  23.6* 23.3*  --  23.6*  MCHC 32.1 32.3  --  32.5 32.3  --  32.8  RDW 14.4 14.4  --  14.5 14.3  --  14.3  LYMPHSABS 0.6* 0.8  --  1.4 2.0  --  2.5  MONOABS 0.2 0.7  --  0.8 0.6  --  0.7  EOSABS 0.0 0.0  --  0.0 0.0  --  0.1  BASOSABS 0.0 0.0  --  0.0 0.0  --  0.0    Chemistries   Recent Labs Lab 09/24/15 1600 09/26/15 1258 09/28/15 0430 09/28/15 1002 09/30/15 2241  NA 143 142  --  142 143  K 4.5 4.0  --  3.5 3.3*  CL 110 106  --   103 101  GLUCOSE  126* 160*  --  159* 92  BUN 17 19  --  21* 12  CREATININE 0.70 0.60 0.69 0.70 0.80   ------------------------------------------------------------------------------------------------------------------ estimated creatinine clearance is 87.3 mL/min (by C-G formula based on Cr of 0.8). ------------------------------------------------------------------------------------------------------------------ No results for input(s): HGBA1C in the last 72 hours. ------------------------------------------------------------------------------------------------------------------ No results for input(s): CHOL, HDL, LDLCALC, TRIG, CHOLHDL, LDLDIRECT in the last 72 hours. ------------------------------------------------------------------------------------------------------------------ No results for input(s): TSH, T4TOTAL, T3FREE, THYROIDAB in the last 72 hours.  Invalid input(s): FREET3 ------------------------------------------------------------------------------------------------------------------ No results for input(s): VITAMINB12, FOLATE, FERRITIN, TIBC, IRON, RETICCTPCT in the last 72 hours.  Coagulation profile  Recent Labs Lab 09/26/15 0850  INR 1.36    No results for input(s): DDIMER in the last 72 hours.  Cardiac Enzymes  Recent Labs Lab 09/28/15 2105 09/29/15 0716 09/29/15 1425  TROPONINI 0.04* <0.03 <0.03   ------------------------------------------------------------------------------------------------------------------ Invalid input(s): POCBNP   Recent Labs  09/29/15 2105 09/30/15 0638 09/30/15 1141 09/30/15 1703 10/01/15 0411 10/01/15 0556  GLUCAP 119* 100* 126* 123* 93 54     Jayveon Convey M.D. [facility] 10/01/2015, 11:28 AM  Pager: [phone] Between 7am to 7pm - call Pager - [phone]  After 7pm go to [email or web address] - password [email or web address]  Call night coverage person covering after 7pm

## 2015-10-01 NOTE — Clinical Social Work Note (Signed)
Clinical Social Work Assessment  Patient Details  Name: Andrea Berry MRN: [id number] Date of Birth: 08/14/1966  Date of referral:  10/01/15               Reason for consult:  Discharge Planning                Permission sought to share information with:  Family Supports Permission granted to share information::  Yes, Verbal Permission Granted  Name::     Building control surveyor::     Relationship::  son  Contact Information:     Housing/Transportation Living arrangements for the past 2 months:  Single Family Home Source of Information:  Patient Patient Interpreter Needed:  None Criminal Activity/Legal Involvement Pertinent to Current Situation/Hospitalization:  No - Comment as needed Significant Relationships:  Adult Children Lives with:  Self Do you feel safe going back to the place where you live?  No Need for family participation in patient care:  No (Coment)  Care giving concerns:  Pt was evaluated by CIR but needs SNF level of care.   Social Worker assessment / plan:  CSW reviewed chart and met with pt and son at bedside. Son was sleeping but pt agreeable to speak with CSW. Pt reports she was informed she would need SNF placement and decided that [facility] or [facility] would be best placement for her. CSW provided SNF list and explained DC process. CSW suggested [facility] search in case top choices are unable to offer a bed. Pt agreeable to plan.  CSW to follow up with bed offers.  Employment status:  Disabled (Comment on whether or not currently receiving Disability) Insurance information:  [place]icaid [place], [employer] PT Recommendations:  [place] / Referral to community resources:  [place]  Patient/Family's Response to care:  Pt has flat affect and minimally engaged.  Patient/Family's Understanding of and Emotional Response to Diagnosis, Current Treatment, and Prognosis:  Pt hopeful for top choices of SNF but  understanding of process.  Emotional Assessment Appearance:  Appears older than stated age Attitude/Demeanor/Rapport:  Sedated Affect (typically observed):  Flat Orientation:  Oriented to Self, Oriented to Place, Oriented to  Time Alcohol / Substance use:  Not Applicable Psych involvement (Current and /or in the community):  No (Comment)  Discharge Needs  Concerns to be addressed:  No discharge needs identified Readmission within the last 30 days:  No Current discharge risk:  None Barriers to Discharge:  No Barriers Identified   Boone Master, [place] 10/01/2015, 12:19 PM Weekend Coverage

## 2015-10-01 NOTE — Clinical Social Work Placement (Signed)
   CLINICAL SOCIAL WORK PLACEMENT  NOTE  Date:  10/01/2015  Patient Details  Name: Andrea Berry MRN: [id number] Date of Birth: [birth year]  Clinical Social Work is seeking post-discharge placement for this patient at the [place] level of care (*CSW will initial, date and re-position this form in  chart as items are completed):  Yes   Patient/family provided with [facility] Work Department's list of facilities offering this level of care within the geographic area requested by the patient (or if unable, by the patient's family).  Yes   Patient/family informed of their freedom to choose among providers that offer the needed level of care, that participate in Medicare, Medicaid or managed care program needed by the patient, have an available bed and are willing to accept the patient.  Yes   Patient/family informed of [facility]'s ownership interest in [facility] and [facility], as well as of the fact that they are under no obligation to receive care at these facilities.  PASRR submitted to EDS on 09/29/15     PASRR number received on 09/29/15     Existing PASRR number confirmed on       FL2 transmitted to all facilities in geographic area requested by pt/family on 10/01/15     FL2 transmitted to all facilities within larger geographic area on       Patient informed that his/her managed care company has contracts with or will negotiate with certain facilities, including the following:            Patient/family informed of bed offers received.  Patient chooses bed at       Physician recommends and patient chooses bed at      Patient to be transferred to   on  .  Patient to be transferred to facility by       Patient family notified on   of transfer.  Name of family member notified:        PHYSICIAN       Additional Comment:    _______________________________________________ Boone Master, [place] 10/01/2015, 12:23 PM

## 2015-10-02 LAB — CBC WITH DIFFERENTIAL/PLATELET
BASOS ABS: 0 10*3/uL (ref 0.0–0.1)
BASOS PCT: 0 %
EOS ABS: 0.1 10*3/uL (ref 0.0–0.7)
EOS PCT: 1 %
HEMATOCRIT: 31.9 % — AB (ref 36.0–46.0)
Hemoglobin: 10.3 g/dL — ABNORMAL LOW (ref 12.0–15.0)
Lymphocytes Relative: 21 %
Lymphs Abs: 1.7 10*3/uL (ref 0.7–4.0)
MCH: 23.1 pg — ABNORMAL LOW (ref 26.0–34.0)
MCHC: 32.3 g/dL (ref 30.0–36.0)
MCV: 71.7 fL — ABNORMAL LOW (ref 78.0–100.0)
MONO ABS: 0.6 10*3/uL (ref 0.1–1.0)
Monocytes Relative: 7 %
NEUTROS ABS: 6 10*3/uL (ref 1.7–7.7)
Neutrophils Relative %: 72 %
PLATELETS: 149 10*3/uL — AB (ref 150–400)
RBC: 4.45 MIL/uL (ref 3.87–5.11)
RDW: 14.3 % (ref 11.5–15.5)
WBC: 8.4 10*3/uL (ref 4.0–10.5)

## 2015-10-02 LAB — GLUCOSE, CAPILLARY
GLUCOSE-CAPILLARY: 72 mg/dL (ref 65–99)
GLUCOSE-CAPILLARY: 132 mg/dL — AB (ref 65–99)

## 2015-10-02 MED ORDER — POLYETHYLENE GLYCOL 3350 17 G PO PACK
17.0000 g | PACK | Freq: Every day | ORAL | Status: DC
  Administered 2015-10-02 – 2015-10-06 (×4): 17 g via ORAL
  Filled 2015-10-02 (×4): qty 1

## 2015-10-02 MED ORDER — PREDNISONE 5 MG PO TABS
30.0000 mg | ORAL_TABLET | Freq: Every day | ORAL | Status: DC
  Administered 2015-10-03 – 2015-10-05 (×3): 30 mg via ORAL
  Filled 2015-10-02: qty 1
  Filled 2015-10-02: qty 2
  Filled 2015-10-02: qty 1

## 2015-10-02 MED ORDER — DOCUSATE SODIUM 100 MG PO CAPS
100.0000 mg | ORAL_CAPSULE | Freq: Two times a day (BID) | ORAL | Status: DC
  Administered 2015-10-02 – 2015-10-06 (×9): 100 mg via ORAL
  Filled 2015-10-02 (×9): qty 1

## 2015-10-02 NOTE — Progress Notes (Addendum)
NEURO HOSPITALIST PROGRESS NOTE   SUBJECTIVE:                                                                                                                        Lying comfortably in bed. Son at the bedside. Stated that no much improvement so far, some flickering finger movements left hand. The right hand is numb. Received 1/5 dose of IVIG yesterday without noticeable side effects. [place] + with titers 347.5  OBJECTIVE:                                                                                                                           Vital signs in last 24 hours: Temp:  [98.4 F (36.9 C)-99 F (37.2 C)] 98.4 F (36.9 C) (01/22 0532) Pulse Rate:  [83-99] 96 (01/22 0532) Resp:  [16-20] 20 (01/22 0532) BP: (119-135)/(60-77) 135/77 mmHg (01/22 0532) SpO2:  [97 %-100 %] 97 % (01/22 0532)  Intake/Output from previous day: 01/21 0701 - 01/22 0700 In: 720 [P.O.:720] Out: -  Intake/Output this shift:   Nutritional status: Diet renal/carb modified with fluid restriction Diet-HS Snack?: Nothing; Room service appropriate?: Yes; Fluid consistency:: Thin  Past Medical History  Diagnosis Date  . Hypertension   . Blind   . Lupus ([place])   . Kidney stones   . Shortness of breath     due to medication  . Depression   . GERD (gastroesophageal reflux disease)   . Headache(784.0)   . Arthritis   . Stroke [facility]) 2008    visually impaired    Physical exam:  Constitutional: well developed, pleasant female in no apparent distress. Eyes: no jaundice or exophthalmos.  Head: normocephalic. Neck: supple, no bruits, no JVD. Cardiac: no murmurs. Lungs: clear. Abdomen: soft, no tender, no mass. Extremities: no edema, clubbing, or cyanosis.  Skin: no rash  Neurologic Exam:  General: NAD Mental Status: Alert, oriented, thought content appropriate. Speech fluent without evidence of aphasia. Able to follow 3 step commands without  difficulty. Cranial Nerves: II: Discs flat bilaterally; Visual fields grossly normal, pupils equal, round, reactive to light and accommodation III,IV, VI: ptosis not present, extra-ocular motions intact bilaterally V,VII: smile symmetric, facial light touch sensation normal bilaterally VIII: hearing normal bilaterally  IX,X: uvula rises symmetrically XI: bilateral shoulder shrug XII: midline tongue extension without atrophy or fasciculations Motor: Complete flaccid paralysis left UE with minimal motor movements left LE proximally. Right grip appear to be weak. Sensory: Pinprick and light touch decreased on the left Deep Tendon Reflexes:  Right: Upper Extremity Left: Upper extremity   biceps (C-5 to C-6) 2/4 biceps (C-5 to C-6) 1/4 tricep (C7) 2/4triceps (C7) 1/4 Brachioradialis (C6) 2/4Brachioradialis (C6) 1/4  Lower Extremity Lower Extremity  quadriceps (L-2 to L-4) 2/4 quadriceps (L-2 to L-4) 1/4 Achilles (S1) 2/4Achilles (S1) 0/4  Plantars: Right: downgoingLeft: downgoing Cerebellar: normal finger-to-nose, normal heel-to-shin test on the right  Lab Results: Lab Results  Component Value Date/Time   CHOL 188 09/22/2015 03:44 AM   Lipid Panel No results for input(s): CHOL, TRIG, HDL, CHOLHDL, VLDL, LDLCALC in the last 72 hours.  Studies/Results: No results found.  MEDICATIONS                                                                                                                        Scheduled: .  stroke: mapping our early stages of recovery book   Does not apply Once  . azaTHIOprine  50 mg Oral Daily  . docusate sodium  100 mg Oral BID  . enoxaparin (LOVENOX) injection  40 mg Subcutaneous Q0600  . Immune Globulin 10%  400 mg/kg Intravenous Q24 Hr x 5  . insulin aspart  0-15  Units Subcutaneous TID WC  . insulin aspart  0-5 Units Subcutaneous QHS  . multivitamin with minerals  1 tablet Oral Daily  . pantoprazole  40 mg Oral Daily  . polyethylene glycol  17 g Oral Daily  . [START ON 10/03/2015] predniSONE  30 mg Oral Q breakfast  . pregabalin  75 mg Oral TID  . sodium chloride  3 mL Intravenous Q12H  . sodium chloride  3 mL Intravenous Q12H    ASSESSMENT/PLAN:                                                                                                           50 y/o female with SLE admitted with few days of acute onset left hemiplegia and enhanced MRI cervical spine demonstrating  longitudinally extensive TM. Now with coexistent newly diagnosed NMO  (titers >347). No clinical improvement after completion of high dose pulse methylprednisolone and 5 day PLEX. Patient and family are frustrated and this is totally understandable. Long discussion with patient and son.Decided to pursue 5 days of IVIG but I am not very optimistic about  achieving significant clinica improvement. She will certainly need immunosuppressive therapy with Rituximab or cyclophosphamide but this is not offered here. She will need to go to either a tertiary center or outpatient neurology/reumathology to pursue this strategy. They agreed with this course of action. Will continue to follow.    Dorian Pod, MD [facility] [phone]  10/02/2015, 11:09 AM

## 2015-10-02 NOTE — Progress Notes (Signed)
[facility]                                                                              Patient Demographics  Andrea Berry, is a 50 y.o. female, DOB - 08-Apr-1966, [id number]  Admit date - 09/21/2015   Admitting Physician Reubin Milan, MD  Outpatient Primary MD for the patient is [name], NP  LOS - 10   Chief Complaint  Patient presents with  . Extremity Weakness       Brief HPI   Pleasant 50 year old African-American female with history of CVA in the past, hypertension, GERD, anxiety, depression, lupus with legal blindness, who is chronically on prednisone presented to the hospital with chief complaints of left-sided weakness are more than leg, in the ER she was seen by neurology and workup was consistent with Devic's disease. She was started on IV Solu-Medrol along with plasma exchange and admitted to [facility] service. She has minimal improvement in her left arm strength, plan to complete her plasma exchange treatments on 09/30/2015    Assessment & Plan   Severe left arm weakness ongoing for 3 days PTA. Underlying history of legal blindness. Secondary to Devic's disease - Per patient, no significant improvement with Solu-Medrol and the plasmapheresis, feels slightly better after first dose of IVIG - Neurology consulted. Patient was placed on IV Solu-Medrol total of 5 doses stop date 09/26/2015 and plasma exchange every other day for a total 5 runs, finished on 09/30/2015  - Continue supportive care with PT OT - Patient was seen by neurology again on 1/20, at this time recommending IVIG, started 1/21.    Transient mild Hypotension with bradycardia, chest pain on 1/18 - Taper prednisone, slowly taper to 5 mg maintenance dose. - EKG showed anterolateral T-wave inversions, similar to prior EKG on 1/16 and 1/11. 2-D echo on 1/12 had shown EF of [id number], grade 1 diastolic dysfunction   History of lupus. On prednisone at home - Patient was placed on IV  Solu-Medrol, along with Imuran. Outpatient follow-up with Dr. Estanislado Pandy postdischarge. -Continue prednisone and taper slowly to maintenance dose of 5 mg   History of TIA/stroke in the past. No acute issues.   Prolonged QTC upon admission. IV magnesium, low-dose beta blocker, monitor on telemetry. Resolved on repeat EKG   Dehydration with tachycardia. Hydrated, and improved, stable echogram.   GERD. On PPI.  Leuokocytosis due to steroids. Afebrile monitor.  Low TSH. Could be sick euthyroid, repeat TSH along with T3 and free T4.   Steroid induced hyperglycemia - Place on sliding scale insulin  Code Status: Full CODE STATUS  Family Communication: Discussed in detail with the patient, all imaging results, lab results explained to the patient and patient's son, [name] at the bedside   Disposition Plan:   Time Spent in minutes  25 minutes  Procedures   CT head, MRI brain and C-spine consistent with Devic's disease.  Lumbar puncture not consistent with meningitis   Right IJ venous dialysis catheter placed by IR on 09/23/2015  TTE   - Left ventricle: The cavity size was normal. Wall thickness wasincreased in a pattern of moderate to severe  LVH. Systolicfunction was vigorous. The estimated ejection fraction was in the range of 65% to 70%. There was dynamic obstruction in the mid cavity, with a peak velocity of 314 cm/sec and a peak gradient of39 mm Hg. Wall motion was normal; there were no regional wall motion abnormalities. Doppler parameters are consistent with abnormal left ventricular relaxation (grade 1 diastolic dysfunction). - Left atrium: The atrium was mildly dilated. - Pericardium, extracardiac: A small pericardial effusion was identified circumferential to the heart  Consults   Neurology  DVT Prophylaxis  Lovenox  Medications  Scheduled Meds: .  stroke: mapping our early stages of recovery book   Does not apply Once  . azaTHIOprine  50 mg Oral Daily  . docusate  sodium  100 mg Oral BID  . enoxaparin (LOVENOX) injection  40 mg Subcutaneous Q0600  . Immune Globulin 10%  400 mg/kg Intravenous Q24 Hr x 5  . insulin aspart  0-15 Units Subcutaneous TID WC  . insulin aspart  0-5 Units Subcutaneous QHS  . multivitamin with minerals  1 tablet Oral Daily  . pantoprazole  40 mg Oral Daily  . polyethylene glycol  17 g Oral Daily  . predniSONE  40 mg Oral Q breakfast  . pregabalin  75 mg Oral TID  . sodium chloride  3 mL Intravenous Q12H  . sodium chloride  3 mL Intravenous Q12H   Continuous Infusions:   PRN Meds:.sodium chloride, bisacodyl, LORazepam, morphine injection, nitroGLYCERIN, ondansetron **OR** ondansetron (ZOFRAN) IV, oxyCODONE, senna-docusate, sodium chloride   Antibiotics   Anti-infectives    Start     Dose/Rate Route Frequency Ordered Stop   09/23/15 0815  ceFAZolin (ANCEF) IVPB 2 g/50 mL premix     2 g 100 mL/hr over 30 Minutes Intravenous  Once 09/23/15 0814 09/23/15 [id number]        Subjective:   Andrea Berry was seen and examined today. Persisting left-sided weakness, feels a slight improvement. Patient  denies any chest pain, shortness of breath, dizziness abdominal pain, N/V/D/C. No fevers or chills. Son at the bedside  Objective:   Blood pressure 135/77, pulse 96, temperature 98.4 F (36.9 C), temperature source Oral, resp. rate 20, height 5\' 1"  (1.549 m), weight 87.272 kg (192 lb 6.4 oz), SpO2 97 %.  Wt Readings from Last 3 Encounters:  09/30/15 87.272 kg (192 lb 6.4 oz)  02/08/14 73.199 kg (161 lb 6 oz)  01/29/14 75.297 kg (166 lb)     Intake/Output Summary (Last 24 hours) at 10/02/15 1112 Last data filed at 10/01/15 2200  Gross per 24 hour  Intake    240 ml  Output      0 ml  Net    240 ml    Exam  General: Alert and oriented x 3, NAD  HEENT:  PERRLA, EOMI  Neck: Supple  CVS: S1 S2clear, RRR  Respiratory: CTAB  Abdomen: Soft, NT, NBS  Ext: no cyanosis clubbing or edema  Neuro: left arm strength 1/5,  left leg 0/5, legally blind  Skin: No rashes  Psych: Normal affect and demeanor, alert and oriented x3    Data Review   Micro Results No results found for this or any previous visit (from the past 240 hour(s)).  Radiology Reports Dg Chest 2 View  09/22/2015  CLINICAL DATA:  TIA. EXAM: CHEST  2 VIEW COMPARISON:  04/29/2012 FINDINGS: Lung volumes are low. Cardiomegaly is unchanged allowing for differences in technique. Minimal bibasilar subsegmental atelectasis no pulmonary edema, consolidation, pleural effusion or pneumothorax. No  acute osseous abnormalities are seen. IMPRESSION: Low lung volumes with stable cardiomegaly. Electronically Signed   By: Jeb Levering M.D.   On: 09/22/2015 03:36   Ct Head Wo Contrast  09/21/2015  CLINICAL DATA:  Acute left-sided weakness. EXAM: CT HEAD WITHOUT CONTRAST TECHNIQUE: Contiguous axial images were obtained from the base of the skull through the vertex without intravenous contrast. COMPARISON:  CT scan of June 24, 2014. FINDINGS: Bony calvarium appears intact. No mass effect or midline shift is noted. Ventricular size is within normal limits. There is no evidence of mass lesion, hemorrhage or acute infarction. IMPRESSION: Normal head CT. Electronically Signed   By: Marijo Conception, M.D.   On: 09/21/2015 16:27   Mr Brain Wo Contrast  09/21/2015  CLINICAL DATA:  Left arm numbness and pain. Neck pain. Stroke versus cervical spine disease. EXAM: MRI HEAD WITHOUT CONTRAST MRI CERVICAL SPINE WITHOUT CONTRAST TECHNIQUE: Multiplanar, multiecho pulse sequences of the brain and surrounding structures, and cervical spine, to include the craniocervical junction and cervicothoracic junction, were obtained without intravenous contrast. COMPARISON:  Head CT earlier same day.  MRI 06/24/2014 FINDINGS: MRI HEAD FINDINGS There is a 1 cm region of T2 and FLAIR abnormality within the left thalamus. No restricted diffusion presently. On the old MRI, there appear to be an  old small vessel insult in this location. Elsewhere, the cerebral hemispheres show a few punctate foci of T2 and FLAIR signal in the deep white matter. No cortical or large vessel territory insult. No mass lesion, hemorrhage, hydrocephalus or extra-axial collection. No pituitary mass. No inflammatory sinus disease. No skull or skullbase lesion. MRI CERVICAL SPINE FINDINGS The cervical spinal cord is diffusely abnormal showing enlargement and inter medullary T2 signal. This extends from the obex all the way to the C7 level. This is consistent with acute deep mild is a shin or myelitis. There are no degenerative changes. There is no compressive pathology. IMPRESSION: 1 cm region of abnormal T2 and FLAIR signal within the left thalamus not consistent with acute infarction. This could be an acute focus of demyelination or conceivably but less likely a late subacute infarction. Diffusely abnormal cervical spinal cord which is swollen and shows diffuse abnormal T2 signal. This is likely to represent acute demyelination/neuromyelitis optica (Devic's disease) or acute transverse myelitis. Quite likely that the findings of the left thalamus in the spinal cord are due to the same pathology. Electronically Signed   By: Nelson Chimes M.D.   On: 09/21/2015 20:41   Mr Jeri Cos Contrast  09/22/2015  CLINICAL DATA:  50 yo female with his history of bilateral optic neuritis now with longitudinally extensive transverse myelitis. Weakness of arms and legs. EXAM: MRI CERVICAL SPINE WITH CONTRAST; MRI HEAD WITH CONTRAST TECHNIQUE: Multiplanar and multiecho pulse sequences of the cervical spine, to include the craniocervical junction and cervicothoracic junction, were obtained according to standard protocol with intravenous contrast.; Multiplanar, multiecho pulse sequences of the brain and surrounding structures were obtained according to standard protocol with intravenous contrast CONTRAST:  82mL MULTIHANCE GADOBENATE DIMEGLUMINE 529  MG/ML IV SOLN COMPARISON:  Noncontrast brain and cervical spine from 07/21/2016. FINDINGS: Post infusion images of the brain demonstrates no abnormal enhancement. LEFT thalamic lesion, T2 and FLAIR hyperintense, probably represents an atypical area of demyelination, but non acute, given the lack of restricted diffusion and absence of blood brain barrier breakdown. In the cervical spine, there is extensive postcontrast enhancement, corresponding to the T2 and FLAIR hyperintensity on yesterday's exam, extending over multiple cervical  segments, from the foramen magnum through C7. This enhancement involves both gray and white matter. There is greater involvement of the LEFT hemicord. In the setting of a patient with history of optic neuritis, the findings are consistent with Devic's disease. No features specific for optic neuritis can be observed on post infusion imaging through the brain, non dedicated orbit study. IMPRESSION: No abnormal intracranial enhancement is documented. Heterogeneous enhancement of the enlarged cervical cord segments, from foramen magnum through C7, involving both gray and white matter, consistent with Devic's disease. Electronically Signed   By: Staci Righter M.D.   On: 09/22/2015 12:39   Mr Cervical Spine Wo Contrast  09/21/2015  CLINICAL DATA:  Left arm numbness and pain. Neck pain. Stroke versus cervical spine disease. EXAM: MRI HEAD WITHOUT CONTRAST MRI CERVICAL SPINE WITHOUT CONTRAST TECHNIQUE: Multiplanar, multiecho pulse sequences of the brain and surrounding structures, and cervical spine, to include the craniocervical junction and cervicothoracic junction, were obtained without intravenous contrast. COMPARISON:  Head CT earlier same day.  MRI 06/24/2014 FINDINGS: MRI HEAD FINDINGS There is a 1 cm region of T2 and FLAIR abnormality within the left thalamus. No restricted diffusion presently. On the old MRI, there appear to be an old small vessel insult in this location. Elsewhere,  the cerebral hemispheres show a few punctate foci of T2 and FLAIR signal in the deep white matter. No cortical or large vessel territory insult. No mass lesion, hemorrhage, hydrocephalus or extra-axial collection. No pituitary mass. No inflammatory sinus disease. No skull or skullbase lesion. MRI CERVICAL SPINE FINDINGS The cervical spinal cord is diffusely abnormal showing enlargement and inter medullary T2 signal. This extends from the obex all the way to the C7 level. This is consistent with acute deep mild is a shin or myelitis. There are no degenerative changes. There is no compressive pathology. IMPRESSION: 1 cm region of abnormal T2 and FLAIR signal within the left thalamus not consistent with acute infarction. This could be an acute focus of demyelination or conceivably but less likely a late subacute infarction. Diffusely abnormal cervical spinal cord which is swollen and shows diffuse abnormal T2 signal. This is likely to represent acute demyelination/neuromyelitis optica (Devic's disease) or acute transverse myelitis. Quite likely that the findings of the left thalamus in the spinal cord are due to the same pathology. Electronically Signed   By: Nelson Chimes M.D.   On: 09/21/2015 20:41   Mr Cervical Spine W Contrast  09/22/2015  CLINICAL DATA:  50 yo female with his history of bilateral optic neuritis now with longitudinally extensive transverse myelitis. Weakness of arms and legs. EXAM: MRI CERVICAL SPINE WITH CONTRAST; MRI HEAD WITH CONTRAST TECHNIQUE: Multiplanar and multiecho pulse sequences of the cervical spine, to include the craniocervical junction and cervicothoracic junction, were obtained according to standard protocol with intravenous contrast.; Multiplanar, multiecho pulse sequences of the brain and surrounding structures were obtained according to standard protocol with intravenous contrast CONTRAST:  58mL MULTIHANCE GADOBENATE DIMEGLUMINE 529 MG/ML IV SOLN COMPARISON:  Noncontrast brain  and cervical spine from 07/21/2016. FINDINGS: Post infusion images of the brain demonstrates no abnormal enhancement. LEFT thalamic lesion, T2 and FLAIR hyperintense, probably represents an atypical area of demyelination, but non acute, given the lack of restricted diffusion and absence of blood brain barrier breakdown. In the cervical spine, there is extensive postcontrast enhancement, corresponding to the T2 and FLAIR hyperintensity on yesterday's exam, extending over multiple cervical segments, from the foramen magnum through C7. This enhancement involves both gray and white  matter. There is greater involvement of the LEFT hemicord. In the setting of a patient with history of optic neuritis, the findings are consistent with Devic's disease. No features specific for optic neuritis can be observed on post infusion imaging through the brain, non dedicated orbit study. IMPRESSION: No abnormal intracranial enhancement is documented. Heterogeneous enhancement of the enlarged cervical cord segments, from foramen magnum through C7, involving both gray and white matter, consistent with Devic's disease. Electronically Signed   By: Staci Righter M.D.   On: 09/22/2015 12:39   Ir Fluoro Guide Cv Line Right  09/23/2015  CLINICAL DATA:  Transverse myelitis.  Plasmapheresis. EXAM: TUNNELED DIALYSIS CATHETER PLACEMENT, ULTRASOUND GUIDANCE FOR VASCULAR ACCESS FLUOROSCOPY TIME:  24 seconds MEDICATIONS AND MEDICAL HISTORY: Versed 1.5 mg, Fentanyl 75 mcg. Additional Medications: As antibiotic prophylaxis, Ancef was ordered pre-procedure and administered intravenously within one hour of incision. ANESTHESIA/SEDATION: Moderate sedation time: 20 minutes CONTRAST:  None PROCEDURE: The procedure, risks, benefits, and alternatives were explained to the patient. Questions regarding the procedure were encouraged and answered. The patient understands and consents to the procedure. The right neck was prepped with ChloraPrep in a sterile  fashion, and a sterile drape was applied covering the operative field. A sterile gown and sterile gloves were used for the procedure. 1% lidocaine into the skin and subcutaneous tissue. The right jugular vein was noted to be patent initially with ultrasound. Under sonographic guidance, a micropuncture needle was inserted into the right IJ vein (Ultrasound and fluoroscopic image documentation was performed). It was removed over an 018 wire which was upsized to an Amplatz. This was advanced into the IVC. A small incision was made in the right upper chest. The tunneling device was utilized to advance the 23 centimeter tip to cuff catheter from the chest incision and out the neck incision. A peel-away sheath was advanced over the Amplatz wire. The leading edge of the catheter was then advanced through the peel-away sheath. The peel-away sheath was removed. It was flushed and instilled with heparin. The chest incision was closed with a 0 Prolene pursestring stitch. The neck incision was closed with a 4-0 Vicryl subcuticular stitch. COMPLICATIONS: None FINDINGS: The image demonstrates placement of a tunneled dialysis catheter with its tip in the right atrium. IMPRESSION: Successful right IJ vein tunneled dialysis catheter with its tip in the right atrium. Electronically Signed   By: Marybelle Killings M.D.   On: 09/23/2015 12:33   Ir US Guide Vasc Access Right  09/23/2015  CLINICAL DATA:  Transverse myelitis.  Plasmapheresis. EXAM: TUNNELED DIALYSIS CATHETER PLACEMENT, ULTRASOUND GUIDANCE FOR VASCULAR ACCESS FLUOROSCOPY TIME:  24 seconds MEDICATIONS AND MEDICAL HISTORY: Versed 1.5 mg, Fentanyl 75 mcg. Additional Medications: As antibiotic prophylaxis, Ancef was ordered pre-procedure and administered intravenously within one hour of incision. ANESTHESIA/SEDATION: Moderate sedation time: 20 minutes CONTRAST:  None PROCEDURE: The procedure, risks, benefits, and alternatives were explained to the patient. Questions regarding the  procedure were encouraged and answered. The patient understands and consents to the procedure. The right neck was prepped with ChloraPrep in a sterile fashion, and a sterile drape was applied covering the operative field. A sterile gown and sterile gloves were used for the procedure. 1% lidocaine into the skin and subcutaneous tissue. The right jugular vein was noted to be patent initially with ultrasound. Under sonographic guidance, a micropuncture needle was inserted into the right IJ vein (Ultrasound and fluoroscopic image documentation was performed). It was removed over an 018 wire which was upsized to an Amplatz.  This was advanced into the IVC. A small incision was made in the right upper chest. The tunneling device was utilized to advance the 23 centimeter tip to cuff catheter from the chest incision and out the neck incision. A peel-away sheath was advanced over the Amplatz wire. The leading edge of the catheter was then advanced through the peel-away sheath. The peel-away sheath was removed. It was flushed and instilled with heparin. The chest incision was closed with a 0 Prolene pursestring stitch. The neck incision was closed with a 4-0 Vicryl subcuticular stitch. COMPLICATIONS: None FINDINGS: The image demonstrates placement of a tunneled dialysis catheter with its tip in the right atrium. IMPRESSION: Successful right IJ vein tunneled dialysis catheter with its tip in the right atrium. Electronically Signed   By: Marybelle Killings M.D.   On: 09/23/2015 12:33   Dg Fluoro Guide Lumbar Puncture  09/22/2015  CLINICAL DATA:  Transverse myelitis. EXAM: DIAGNOSTIC LUMBAR PUNCTURE UNDER FLUOROSCOPIC GUIDANCE FLUOROSCOPY TIME:  Fluoroscopy Time (in minutes and seconds): 0 minutes, 37 seconds Number of Acquired Images:  0 PROCEDURE: I discussed the risks (including hemorrhage, infection, headache, and nerve damage, among others), benefits, and alternatives to fluoroscopically guided lumbar puncture with the patient.  We specifically discussed the high technical likelihood of success of the procedure. The patient understood and elected to undergo the procedure. Standard time-out was employed. Following sterile skin prep and local anesthetic administration consisting of 1 percent lidocaine, a 22 gauge spinal needle was advanced without difficulty into the thecal sac at the at the L4-5 level. Clear CSF was returned. Opening pressure was 17 cm of water. 13 cc of clear CSF was collected. The needle was subsequently removed and the skin cleansed and bandaged. No immediate complications were observed. IMPRESSION: 1. Technically successful fluoroscopically guided lumbar puncture at the L4-5 level. No complicating feature observed. Opening pressure 17 cm of water. Electronically Signed   By: Van Clines M.D.   On: 09/22/2015 09:15    CBC  Recent Labs Lab 09/28/15 0430  09/29/15 0716 09/30/15 0332 09/30/15 2241 10/01/15 0501 10/02/15 0350  WBC 12.1*  --  10.1 8.9  --  9.0 8.4  HGB 11.4*  < > 11.0* 11.2* 12.2 11.1* 10.3*  HCT 35.3*  < > 33.8* 34.7* 36.0 33.8* 31.9*  PLT 153  --  120* 132*  --  147* 149*  MCV 72.2*  --  72.4* 72.1*  --  71.8* 71.7*  MCH 23.3*  --  23.6* 23.3*  --  23.6* 23.1*  MCHC 32.3  --  32.5 32.3  --  32.8 32.3  RDW 14.4  --  14.5 14.3  --  14.3 14.3  LYMPHSABS 0.8  --  1.4 2.0  --  2.5 1.7  MONOABS 0.7  --  0.8 0.6  --  0.7 0.6  EOSABS 0.0  --  0.0 0.0  --  0.1 0.1  BASOSABS 0.0  --  0.0 0.0  --  0.0 0.0  < > = values in this interval not displayed.  Chemistries   Recent Labs Lab 09/26/15 1258 09/28/15 0430 09/28/15 1002 09/30/15 2241  NA 142  --  142 143  K 4.0  --  3.5 3.3*  CL 106  --  103 101  GLUCOSE 160*  --  159* 92  BUN 19  --  21* 12  CREATININE 0.60 0.69 0.70 0.80   ------------------------------------------------------------------------------------------------------------------ estimated creatinine clearance is 87.3 mL/min (by C-G formula based on Cr of  0.8). ------------------------------------------------------------------------------------------------------------------  No results for input(s): HGBA1C in the last 72 hours. ------------------------------------------------------------------------------------------------------------------ No results for input(s): CHOL, HDL, LDLCALC, TRIG, CHOLHDL, LDLDIRECT in the last 72 hours. ------------------------------------------------------------------------------------------------------------------ No results for input(s): TSH, T4TOTAL, T3FREE, THYROIDAB in the last 72 hours.  Invalid input(s): FREET3 ------------------------------------------------------------------------------------------------------------------ No results for input(s): VITAMINB12, FOLATE, FERRITIN, TIBC, IRON, RETICCTPCT in the last 72 hours.  Coagulation profile  Recent Labs Lab 09/26/15 0850  INR 1.36    No results for input(s): DDIMER in the last 72 hours.  Cardiac Enzymes  Recent Labs Lab 09/28/15 2105 09/29/15 0716 09/29/15 1425  TROPONINI 0.04* <0.03 <0.03   ------------------------------------------------------------------------------------------------------------------ Invalid input(s): POCBNP   Recent Labs  09/30/15 1703 10/01/15 0411 10/01/15 0556 10/01/15 1154 10/01/15 1632 10/01/15 2158  GLUCAP 123* 93 97 160* 178* 110*     RAI,RIPUDEEP M.D. [facility] 10/02/2015, 11:12 AM  Pager: [phone] Between 7am to 7pm - call Pager - [phone]  After 7pm go to [email or web address] - password [email or web address]  Call night coverage person covering after 7pm

## 2015-10-03 LAB — CBC WITH DIFFERENTIAL/PLATELET
BASOS PCT: 0 %
Basophils Absolute: 0 10*3/uL (ref 0.0–0.1)
EOS ABS: 0 10*3/uL (ref 0.0–0.7)
Eosinophils Relative: 0 %
HEMATOCRIT: 30.8 % — AB (ref 36.0–46.0)
HEMOGLOBIN: 9.9 g/dL — AB (ref 12.0–15.0)
LYMPHS ABS: 1.1 10*3/uL (ref 0.7–4.0)
Lymphocytes Relative: 13 %
MCH: 23.6 pg — AB (ref 26.0–34.0)
MCHC: 32.1 g/dL (ref 30.0–36.0)
MCV: 73.3 fL — ABNORMAL LOW (ref 78.0–100.0)
MONOS PCT: 7 %
Monocytes Absolute: 0.5 10*3/uL (ref 0.1–1.0)
NEUTROS ABS: 6.7 10*3/uL (ref 1.7–7.7)
NEUTROS PCT: 81 %
Platelets: 181 10*3/uL (ref 150–400)
RBC: 4.2 MIL/uL (ref 3.87–5.11)
RDW: 15.2 % (ref 11.5–15.5)
WBC: 8.3 10*3/uL (ref 4.0–10.5)

## 2015-10-03 LAB — GLUCOSE, CAPILLARY
GLUCOSE-CAPILLARY: 78 mg/dL (ref 65–99)
Glucose-Capillary: 108 mg/dL — ABNORMAL HIGH (ref 65–99)
Glucose-Capillary: 121 mg/dL — ABNORMAL HIGH (ref 65–99)
Glucose-Capillary: 139 mg/dL — ABNORMAL HIGH (ref 65–99)
Glucose-Capillary: 171 mg/dL — ABNORMAL HIGH (ref 65–99)
Glucose-Capillary: 89 mg/dL (ref 65–99)

## 2015-10-03 NOTE — Care Management Important Message (Signed)
Important Message  Patient Details  Name: Andrea Berry MRN: [id number] Date of Birth: 25-May-1966   Medicare Important Message Given:  Yes    Nathen May 10/03/2015, 3:47 PM

## 2015-10-03 NOTE — Progress Notes (Signed)
Physical Therapy Treatment Patient Details Name: Andrea Berry MRN: [id number] DOB: February 19, 1966 Today's Date: 10/03/2015    History of Present Illness 50 yo female initially presenting with Lt UE weakness and numbness. 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 PMH: legally blind, hypertension, lupus, depression, CVA 2008.    PT Comments    Attempting to progress mobility as tolerated during session. Performed sit/stand X3 with min assistance on Lt side. Patient refusing to sit up in chair at this time but willing to participate in PT session. Patient demonstrating active motion in Lt LE with knee extension, hip flexion and dorsiflexion. PT to continue to follow and progress mobility as tolerated. Follow up recommendations may need to be modified as patient progresses.   Follow Up Recommendations  SNF;Supervision/Assistance - 24 hour     Equipment Recommendations  Other (comment) (TBD at next venue)    Recommendations for Other Services       Precautions / Restrictions Precautions Precautions: Fall;Other (comment) (Lt hemiparesis) Precaution Comments: Legally blind Restrictions Weight Bearing Restrictions: No    Mobility  Bed Mobility Overal bed mobility: Needs Assistance Bed Mobility: Supine to Sit;Sit to Supine     Supine to sit: Supervision;HOB elevated (with rail) Sit to supine: Mod assist (assist with LEs)   General bed mobility comments: verbal and tactile cues provided for safety  Transfers Overall transfer level: Needs assistance Equipment used: 1 person hand held assist Transfers: Sit to/from Stand Sit to Stand: Min assist         General transfer comment: Min assist with HHA on Lt side, patient leaning to right. Working on weight shifts to Lt with Lt knee blocked. Repeat X3.   Ambulation/Gait                 Stairs            Wheelchair Mobility    Modified Rankin (Stroke Patients Only)       Balance Overall balance assessment: Needs  assistance Sitting-balance support: Single extremity supported Sitting balance-Leahy Scale: Poor     Standing balance support: Bilateral upper extremity supported Standing balance-Leahy Scale: Poor Standing balance comment: using bed rail on right and HHA on lt                    Cognition Arousal/Alertness: Awake/alert Behavior During Therapy: Flat affect Overall Cognitive Status: Within Functional Limits for tasks assessed                      Exercises Other Exercises Other Exercises: dorsiflexion stretch Lt 3X60 seconds    General Comments        Pertinent Vitals/Pain Pain Assessment: Faces Faces Pain Scale: Hurts a little bit Pain Location: Lt arm Pain Descriptors / Indicators:  (sensitive) Pain Intervention(s): Limited activity within patient's tolerance;Monitored during session    Home Living                      Prior Function            PT Goals (current goals can now be found in the care plan section) Acute Rehab PT Goals Patient Stated Goal: return to being independent PT Goal Formulation: With patient Time For Goal Achievement: 10/07/15 Potential to Achieve Goals: Fair Progress towards PT goals: Progressing toward goals    Frequency  Min 3X/week    PT Plan Current plan remains appropriate    Co-evaluation  End of Session Equipment Utilized During Treatment: Gait belt Activity Tolerance: Patient limited by fatigue Patient left: in bed;with call bell/phone within reach;with bed alarm set;with family/visitor present;Other (comment) (4 rails up per patient request)     Time: [id number] PT Time Calculation (min) (ACUTE ONLY): 28 min  Charges:  $Therapeutic Activity: 23-37 mins                    G Codes:      Cassell Clement, PT, CSCS Pager [phone] Office [phone]  10/03/2015, 1:42 PM

## 2015-10-03 NOTE — Progress Notes (Signed)
[facility]                                                                              Patient Demographics  Andrea Berry, is a 50 y.o. female, DOB - May 27, 1966, [id number]  Admit date - 09/21/2015   Admitting Physician Reubin Milan, MD  Outpatient Primary MD for the patient is [name], NP  LOS - 11   Chief Complaint  Patient presents with  . Extremity Weakness       Brief HPI   Pleasant 50 year old African-American female with history of CVA in the past, hypertension, GERD, anxiety, depression, lupus with legal blindness, who is chronically on prednisone presented to the hospital with chief complaints of left-sided weakness are more than leg, in the ER she was seen by neurology and workup was consistent with Devic's disease. She was started on IV Solu-Medrol along with plasma exchange and admitted to [facility] service. She has minimal improvement in her left arm strength, plan to complete her plasma exchange treatments on 09/30/2015    Assessment & Plan   Severe left arm weakness ongoing for 3 days PTA. Underlying history of legal blindness. Secondary to Devic's disease - Per patient, no significant improvement with Solu-Medrol and the plasmapheresis, feels slightly better after first dose of IVIG - Neurology consulted. Patient was placed on IV Solu-Medrol total of 5 doses stop date 09/26/2015 and plasma exchange every other day for a total 5 runs, finished on 09/30/2015  - Continue supportive care with PT OT - Patient was seen by neurology again on 1/20, at this time recommending IVIG, started 1/21, day #3 today .    Transient mild Hypotension with bradycardia, chest pain on 1/18 - Taper prednisone, slowly taper to 5 mg maintenance dose. - EKG showed anterolateral T-wave inversions, similar to prior EKG on 1/16 and 1/11. 2-D echo on 1/12 had shown EF of [id number], grade 1 diastolic dysfunction   History of lupus. On prednisone at home - Patient  was placed on IV Solu-Medrol, along with Imuran. Outpatient follow-up with Dr. Estanislado Pandy postdischarge. -Continue prednisone, tapered slowly to maintenance dose of 5 mg   History of TIA/stroke in the past. No acute issues.   Prolonged QTC upon admission. IV magnesium, low-dose beta blocker, monitor on telemetry. Resolved on repeat EKG   Dehydration with tachycardia. Hydrated, and improved, stable echogram.   GERD. On PPI.  Leuokocytosis due to steroids. Afebrile monitor.  Low TSH. Could be sick euthyroid  Steroid induced hyperglycemia - Place on sliding scale insulin  Code Status: Full CODE STATUS  Family Communication: Discussed in detail with the patient, all imaging results, lab results explained to the patient and patient's son, [name] at the bedside   Disposition Plan:   Time Spent in minutes  25 minutes  Procedures   CT head, MRI brain and C-spine consistent with Devic's disease.  Lumbar puncture not consistent with meningitis   Right IJ venous dialysis catheter placed by IR on 09/23/2015  TTE   - Left ventricle: The cavity size was normal. Wall thickness wasincreased in a pattern of moderate to severe LVH. Systolicfunction was vigorous. The estimated  ejection fraction was in the range of 65% to 70%. There was dynamic obstruction in the mid cavity, with a peak velocity of 314 cm/sec and a peak gradient of39 mm Hg. Wall motion was normal; there were no regional wall motion abnormalities. Doppler parameters are consistent with abnormal left ventricular relaxation (grade 1 diastolic dysfunction). - Left atrium: The atrium was mildly dilated. - Pericardium, extracardiac: A small pericardial effusion was identified circumferential to the heart  Consults   Neurology  DVT Prophylaxis  Lovenox  Medications  Scheduled Meds: .  stroke: mapping our early stages of recovery book   Does not apply Once  . azaTHIOprine  50 mg Oral Daily  . docusate sodium  100 mg Oral BID    . enoxaparin (LOVENOX) injection  40 mg Subcutaneous Q0600  . Immune Globulin 10%  400 mg/kg Intravenous Q24 Hr x 5  . insulin aspart  0-15 Units Subcutaneous TID WC  . insulin aspart  0-5 Units Subcutaneous QHS  . multivitamin with minerals  1 tablet Oral Daily  . pantoprazole  40 mg Oral Daily  . polyethylene glycol  17 g Oral Daily  . predniSONE  30 mg Oral Q breakfast  . pregabalin  75 mg Oral TID  . sodium chloride  3 mL Intravenous Q12H  . sodium chloride  3 mL Intravenous Q12H   Continuous Infusions:   PRN Meds:.sodium chloride, bisacodyl, LORazepam, morphine injection, nitroGLYCERIN, ondansetron **OR** ondansetron (ZOFRAN) IV, oxyCODONE, senna-docusate, sodium chloride   Antibiotics   Anti-infectives    Start     Dose/Rate Route Frequency Ordered Stop   09/23/15 0815  ceFAZolin (ANCEF) IVPB 2 g/50 mL premix     2 g 100 mL/hr over 30 Minutes Intravenous  Once 09/23/15 0814 09/23/15 [id number]        Subjective:   Andrea Berry was seen and examined today. Persisting left-sided weakness, although noticed slight improvement in the lower leg. Patient  denies any chest pain, shortness of breath, dizziness abdominal pain, N/V/D/C. No fevers or chills. Son at the bedside  Objective:   Blood pressure 110/72, pulse 108, temperature 98.9 F (37.2 C), temperature source Oral, resp. rate 20, height 5\' 1"  (1.549 m), weight 87.272 kg (192 lb 6.4 oz), SpO2 97 %.  Wt Readings from Last 3 Encounters:  09/30/15 87.272 kg (192 lb 6.4 oz)  02/08/14 73.199 kg (161 lb 6 oz)  01/29/14 75.297 kg (166 lb)     Intake/Output Summary (Last 24 hours) at 10/03/15 1222 Last data filed at 10/03/15 0100  Gross per 24 hour  Intake      0 ml  Output    400 ml  Net   -400 ml    Exam  General: Alert and oriented x 3, NAD  HEENT:  PERRLA, EOMI  Neck: Supple  CVS: S1 S2clear, RRR  Respiratory: CTAB  Abdomen: Soft, NT, NBS  Ext: no cyanosis clubbing or edema  Neuro: left arm strength  1/5, left leg 1-2/5, legally blind  Skin: No rashes  Psych: Normal affect and demeanor, alert and oriented x3    Data Review   Micro Results No results found for this or any previous visit (from the past 240 hour(s)).  Radiology Reports Dg Chest 2 View  09/22/2015  CLINICAL DATA:  TIA. EXAM: CHEST  2 VIEW COMPARISON:  04/29/2012 FINDINGS: Lung volumes are low. Cardiomegaly is unchanged allowing for differences in technique. Minimal bibasilar subsegmental atelectasis no pulmonary edema, consolidation, pleural effusion or pneumothorax. No acute osseous  abnormalities are seen. IMPRESSION: Low lung volumes with stable cardiomegaly. Electronically Signed   By: Jeb Levering M.D.   On: 09/22/2015 03:36   Ct Head Wo Contrast  09/21/2015  CLINICAL DATA:  Acute left-sided weakness. EXAM: CT HEAD WITHOUT CONTRAST TECHNIQUE: Contiguous axial images were obtained from the base of the skull through the vertex without intravenous contrast. COMPARISON:  CT scan of June 24, 2014. FINDINGS: Bony calvarium appears intact. No mass effect or midline shift is noted. Ventricular size is within normal limits. There is no evidence of mass lesion, hemorrhage or acute infarction. IMPRESSION: Normal head CT. Electronically Signed   By: Marijo Conception, M.D.   On: 09/21/2015 16:27   Mr Brain Wo Contrast  09/21/2015  CLINICAL DATA:  Left arm numbness and pain. Neck pain. Stroke versus cervical spine disease. EXAM: MRI HEAD WITHOUT CONTRAST MRI CERVICAL SPINE WITHOUT CONTRAST TECHNIQUE: Multiplanar, multiecho pulse sequences of the brain and surrounding structures, and cervical spine, to include the craniocervical junction and cervicothoracic junction, were obtained without intravenous contrast. COMPARISON:  Head CT earlier same day.  MRI 06/24/2014 FINDINGS: MRI HEAD FINDINGS There is a 1 cm region of T2 and FLAIR abnormality within the left thalamus. No restricted diffusion presently. On the old MRI, there appear to  be an old small vessel insult in this location. Elsewhere, the cerebral hemispheres show a few punctate foci of T2 and FLAIR signal in the deep white matter. No cortical or large vessel territory insult. No mass lesion, hemorrhage, hydrocephalus or extra-axial collection. No pituitary mass. No inflammatory sinus disease. No skull or skullbase lesion. MRI CERVICAL SPINE FINDINGS The cervical spinal cord is diffusely abnormal showing enlargement and inter medullary T2 signal. This extends from the obex all the way to the C7 level. This is consistent with acute deep mild is a shin or myelitis. There are no degenerative changes. There is no compressive pathology. IMPRESSION: 1 cm region of abnormal T2 and FLAIR signal within the left thalamus not consistent with acute infarction. This could be an acute focus of demyelination or conceivably but less likely a late subacute infarction. Diffusely abnormal cervical spinal cord which is swollen and shows diffuse abnormal T2 signal. This is likely to represent acute demyelination/neuromyelitis optica (Devic's disease) or acute transverse myelitis. Quite likely that the findings of the left thalamus in the spinal cord are due to the same pathology. Electronically Signed   By: Nelson Chimes M.D.   On: 09/21/2015 20:41   Mr Jeri Cos Contrast  09/22/2015  CLINICAL DATA:  50 yo female with his history of bilateral optic neuritis now with longitudinally extensive transverse myelitis. Weakness of arms and legs. EXAM: MRI CERVICAL SPINE WITH CONTRAST; MRI HEAD WITH CONTRAST TECHNIQUE: Multiplanar and multiecho pulse sequences of the cervical spine, to include the craniocervical junction and cervicothoracic junction, were obtained according to standard protocol with intravenous contrast.; Multiplanar, multiecho pulse sequences of the brain and surrounding structures were obtained according to standard protocol with intravenous contrast CONTRAST:  65mL MULTIHANCE GADOBENATE DIMEGLUMINE  529 MG/ML IV SOLN COMPARISON:  Noncontrast brain and cervical spine from 07/21/2016. FINDINGS: Post infusion images of the brain demonstrates no abnormal enhancement. LEFT thalamic lesion, T2 and FLAIR hyperintense, probably represents an atypical area of demyelination, but non acute, given the lack of restricted diffusion and absence of blood brain barrier breakdown. In the cervical spine, there is extensive postcontrast enhancement, corresponding to the T2 and FLAIR hyperintensity on yesterday's exam, extending over multiple cervical segments, from  the foramen magnum through C7. This enhancement involves both gray and white matter. There is greater involvement of the LEFT hemicord. In the setting of a patient with history of optic neuritis, the findings are consistent with Devic's disease. No features specific for optic neuritis can be observed on post infusion imaging through the brain, non dedicated orbit study. IMPRESSION: No abnormal intracranial enhancement is documented. Heterogeneous enhancement of the enlarged cervical cord segments, from foramen magnum through C7, involving both gray and white matter, consistent with Devic's disease. Electronically Signed   By: Staci Righter M.D.   On: 09/22/2015 12:39   Mr Cervical Spine Wo Contrast  09/21/2015  CLINICAL DATA:  Left arm numbness and pain. Neck pain. Stroke versus cervical spine disease. EXAM: MRI HEAD WITHOUT CONTRAST MRI CERVICAL SPINE WITHOUT CONTRAST TECHNIQUE: Multiplanar, multiecho pulse sequences of the brain and surrounding structures, and cervical spine, to include the craniocervical junction and cervicothoracic junction, were obtained without intravenous contrast. COMPARISON:  Head CT earlier same day.  MRI 06/24/2014 FINDINGS: MRI HEAD FINDINGS There is a 1 cm region of T2 and FLAIR abnormality within the left thalamus. No restricted diffusion presently. On the old MRI, there appear to be an old small vessel insult in this location.  Elsewhere, the cerebral hemispheres show a few punctate foci of T2 and FLAIR signal in the deep white matter. No cortical or large vessel territory insult. No mass lesion, hemorrhage, hydrocephalus or extra-axial collection. No pituitary mass. No inflammatory sinus disease. No skull or skullbase lesion. MRI CERVICAL SPINE FINDINGS The cervical spinal cord is diffusely abnormal showing enlargement and inter medullary T2 signal. This extends from the obex all the way to the C7 level. This is consistent with acute deep mild is a shin or myelitis. There are no degenerative changes. There is no compressive pathology. IMPRESSION: 1 cm region of abnormal T2 and FLAIR signal within the left thalamus not consistent with acute infarction. This could be an acute focus of demyelination or conceivably but less likely a late subacute infarction. Diffusely abnormal cervical spinal cord which is swollen and shows diffuse abnormal T2 signal. This is likely to represent acute demyelination/neuromyelitis optica (Devic's disease) or acute transverse myelitis. Quite likely that the findings of the left thalamus in the spinal cord are due to the same pathology. Electronically Signed   By: Nelson Chimes M.D.   On: 09/21/2015 20:41   Mr Cervical Spine W Contrast  09/22/2015  CLINICAL DATA:  50 yo female with his history of bilateral optic neuritis now with longitudinally extensive transverse myelitis. Weakness of arms and legs. EXAM: MRI CERVICAL SPINE WITH CONTRAST; MRI HEAD WITH CONTRAST TECHNIQUE: Multiplanar and multiecho pulse sequences of the cervical spine, to include the craniocervical junction and cervicothoracic junction, were obtained according to standard protocol with intravenous contrast.; Multiplanar, multiecho pulse sequences of the brain and surrounding structures were obtained according to standard protocol with intravenous contrast CONTRAST:  36mL MULTIHANCE GADOBENATE DIMEGLUMINE 529 MG/ML IV SOLN COMPARISON:   Noncontrast brain and cervical spine from 07/21/2016. FINDINGS: Post infusion images of the brain demonstrates no abnormal enhancement. LEFT thalamic lesion, T2 and FLAIR hyperintense, probably represents an atypical area of demyelination, but non acute, given the lack of restricted diffusion and absence of blood brain barrier breakdown. In the cervical spine, there is extensive postcontrast enhancement, corresponding to the T2 and FLAIR hyperintensity on yesterday's exam, extending over multiple cervical segments, from the foramen magnum through C7. This enhancement involves both gray and white matter. There  is greater involvement of the LEFT hemicord. In the setting of a patient with history of optic neuritis, the findings are consistent with Devic's disease. No features specific for optic neuritis can be observed on post infusion imaging through the brain, non dedicated orbit study. IMPRESSION: No abnormal intracranial enhancement is documented. Heterogeneous enhancement of the enlarged cervical cord segments, from foramen magnum through C7, involving both gray and white matter, consistent with Devic's disease. Electronically Signed   By: Staci Righter M.D.   On: 09/22/2015 12:39   Ir Fluoro Guide Cv Line Right  09/23/2015  CLINICAL DATA:  Transverse myelitis.  Plasmapheresis. EXAM: TUNNELED DIALYSIS CATHETER PLACEMENT, ULTRASOUND GUIDANCE FOR VASCULAR ACCESS FLUOROSCOPY TIME:  24 seconds MEDICATIONS AND MEDICAL HISTORY: Versed 1.5 mg, Fentanyl 75 mcg. Additional Medications: As antibiotic prophylaxis, Ancef was ordered pre-procedure and administered intravenously within one hour of incision. ANESTHESIA/SEDATION: Moderate sedation time: 20 minutes CONTRAST:  None PROCEDURE: The procedure, risks, benefits, and alternatives were explained to the patient. Questions regarding the procedure were encouraged and answered. The patient understands and consents to the procedure. The right neck was prepped with  ChloraPrep in a sterile fashion, and a sterile drape was applied covering the operative field. A sterile gown and sterile gloves were used for the procedure. 1% lidocaine into the skin and subcutaneous tissue. The right jugular vein was noted to be patent initially with ultrasound. Under sonographic guidance, a micropuncture needle was inserted into the right IJ vein (Ultrasound and fluoroscopic image documentation was performed). It was removed over an 018 wire which was upsized to an Amplatz. This was advanced into the IVC. A small incision was made in the right upper chest. The tunneling device was utilized to advance the 23 centimeter tip to cuff catheter from the chest incision and out the neck incision. A peel-away sheath was advanced over the Amplatz wire. The leading edge of the catheter was then advanced through the peel-away sheath. The peel-away sheath was removed. It was flushed and instilled with heparin. The chest incision was closed with a 0 Prolene pursestring stitch. The neck incision was closed with a 4-0 Vicryl subcuticular stitch. COMPLICATIONS: None FINDINGS: The image demonstrates placement of a tunneled dialysis catheter with its tip in the right atrium. IMPRESSION: Successful right IJ vein tunneled dialysis catheter with its tip in the right atrium. Electronically Signed   By: Marybelle Killings M.D.   On: 09/23/2015 12:33   Ir US Guide Vasc Access Right  09/23/2015  CLINICAL DATA:  Transverse myelitis.  Plasmapheresis. EXAM: TUNNELED DIALYSIS CATHETER PLACEMENT, ULTRASOUND GUIDANCE FOR VASCULAR ACCESS FLUOROSCOPY TIME:  24 seconds MEDICATIONS AND MEDICAL HISTORY: Versed 1.5 mg, Fentanyl 75 mcg. Additional Medications: As antibiotic prophylaxis, Ancef was ordered pre-procedure and administered intravenously within one hour of incision. ANESTHESIA/SEDATION: Moderate sedation time: 20 minutes CONTRAST:  None PROCEDURE: The procedure, risks, benefits, and alternatives were explained to the patient.  Questions regarding the procedure were encouraged and answered. The patient understands and consents to the procedure. The right neck was prepped with ChloraPrep in a sterile fashion, and a sterile drape was applied covering the operative field. A sterile gown and sterile gloves were used for the procedure. 1% lidocaine into the skin and subcutaneous tissue. The right jugular vein was noted to be patent initially with ultrasound. Under sonographic guidance, a micropuncture needle was inserted into the right IJ vein (Ultrasound and fluoroscopic image documentation was performed). It was removed over an 018 wire which was upsized to an Amplatz. This was  advanced into the IVC. A small incision was made in the right upper chest. The tunneling device was utilized to advance the 23 centimeter tip to cuff catheter from the chest incision and out the neck incision. A peel-away sheath was advanced over the Amplatz wire. The leading edge of the catheter was then advanced through the peel-away sheath. The peel-away sheath was removed. It was flushed and instilled with heparin. The chest incision was closed with a 0 Prolene pursestring stitch. The neck incision was closed with a 4-0 Vicryl subcuticular stitch. COMPLICATIONS: None FINDINGS: The image demonstrates placement of a tunneled dialysis catheter with its tip in the right atrium. IMPRESSION: Successful right IJ vein tunneled dialysis catheter with its tip in the right atrium. Electronically Signed   By: Marybelle Killings M.D.   On: 09/23/2015 12:33   Dg Fluoro Guide Lumbar Puncture  09/22/2015  CLINICAL DATA:  Transverse myelitis. EXAM: DIAGNOSTIC LUMBAR PUNCTURE UNDER FLUOROSCOPIC GUIDANCE FLUOROSCOPY TIME:  Fluoroscopy Time (in minutes and seconds): 0 minutes, 37 seconds Number of Acquired Images:  0 PROCEDURE: I discussed the risks (including hemorrhage, infection, headache, and nerve damage, among others), benefits, and alternatives to fluoroscopically guided lumbar  puncture with the patient. We specifically discussed the high technical likelihood of success of the procedure. The patient understood and elected to undergo the procedure. Standard time-out was employed. Following sterile skin prep and local anesthetic administration consisting of 1 percent lidocaine, a 22 gauge spinal needle was advanced without difficulty into the thecal sac at the at the L4-5 level. Clear CSF was returned. Opening pressure was 17 cm of water. 13 cc of clear CSF was collected. The needle was subsequently removed and the skin cleansed and bandaged. No immediate complications were observed. IMPRESSION: 1. Technically successful fluoroscopically guided lumbar puncture at the L4-5 level. No complicating feature observed. Opening pressure 17 cm of water. Electronically Signed   By: Van Clines M.D.   On: 09/22/2015 09:15    CBC  Recent Labs Lab 09/29/15 0716 09/30/15 0332 09/30/15 2241 10/01/15 0501 10/02/15 0350 10/03/15 0319  WBC 10.1 8.9  --  9.0 8.4 8.3  HGB 11.0* 11.2* 12.2 11.1* 10.3* 9.9*  HCT 33.8* 34.7* 36.0 33.8* 31.9* 30.8*  PLT 120* 132*  --  147* 149* 181  MCV 72.4* 72.1*  --  71.8* 71.7* 73.3*  MCH 23.6* 23.3*  --  23.6* 23.1* 23.6*  MCHC 32.5 32.3  --  32.8 32.3 32.1  RDW 14.5 14.3  --  14.3 14.3 15.2  LYMPHSABS 1.4 2.0  --  2.5 1.7 1.1  MONOABS 0.8 0.6  --  0.7 0.6 0.5  EOSABS 0.0 0.0  --  0.1 0.1 0.0  BASOSABS 0.0 0.0  --  0.0 0.0 0.0    Chemistries   Recent Labs Lab 09/26/15 1258 09/28/15 0430 09/28/15 1002 09/30/15 2241  NA 142  --  142 143  K 4.0  --  3.5 3.3*  CL 106  --  103 101  GLUCOSE 160*  --  159* 92  BUN 19  --  21* 12  CREATININE 0.60 0.69 0.70 0.80   ------------------------------------------------------------------------------------------------------------------ estimated creatinine clearance is 87.3 mL/min (by C-G formula based on Cr of  0.8). ------------------------------------------------------------------------------------------------------------------ No results for input(s): HGBA1C in the last 72 hours. ------------------------------------------------------------------------------------------------------------------ No results for input(s): CHOL, HDL, LDLCALC, TRIG, CHOLHDL, LDLDIRECT in the last 72 hours. ------------------------------------------------------------------------------------------------------------------ No results for input(s): TSH, T4TOTAL, T3FREE, THYROIDAB in the last 72 hours.  Invalid input(s): FREET3 ------------------------------------------------------------------------------------------------------------------ No results  for input(s): VITAMINB12, FOLATE, FERRITIN, TIBC, IRON, RETICCTPCT in the last 72 hours.  Coagulation profile No results for input(s): INR, PROTIME in the last 168 hours.  No results for input(s): DDIMER in the last 72 hours.  Cardiac Enzymes  Recent Labs Lab 09/28/15 2105 09/29/15 0716 09/29/15 1425  TROPONINI 0.04* <0.03 <0.03   ------------------------------------------------------------------------------------------------------------------ Invalid input(s): POCBNP   Recent Labs  10/02/15 0649 10/02/15 1141 10/02/15 1653 10/03/15 0022 10/03/15 0656 10/03/15 1104  GLUCAP 72 108* 132* 35 78 139*     Laker Thompson M.D. [facility] 10/03/2015, 12:22 PM  Pager: [phone] Between 7am to 7pm - call Pager - [phone]  After 7pm go to [email or web address] - password [email or web address]  Call night coverage person covering after 7pm

## 2015-10-04 LAB — GLUCOSE, CAPILLARY
GLUCOSE-CAPILLARY: 70 mg/dL (ref 65–99)
GLUCOSE-CAPILLARY: 123 mg/dL — AB (ref 65–99)
Glucose-Capillary: 174 mg/dL — ABNORMAL HIGH (ref 65–99)
Glucose-Capillary: 99 mg/dL (ref 65–99)

## 2015-10-04 LAB — CBC WITH DIFFERENTIAL/PLATELET
BASOS PCT: 0 %
Basophils Absolute: 0 10*3/uL (ref 0.0–0.1)
EOS ABS: 0 10*3/uL (ref 0.0–0.7)
EOS PCT: 0 %
HCT: 31.7 % — ABNORMAL LOW (ref 36.0–46.0)
HEMOGLOBIN: 10.2 g/dL — AB (ref 12.0–15.0)
LYMPHS PCT: 18 %
Lymphs Abs: 1.2 10*3/uL (ref 0.7–4.0)
MCH: 23.3 pg — AB (ref 26.0–34.0)
MCHC: 32.2 g/dL (ref 30.0–36.0)
MCV: 72.5 fL — AB (ref 78.0–100.0)
MONO ABS: 0.5 10*3/uL (ref 0.1–1.0)
Monocytes Relative: 7 %
NEUTROS PCT: 75 %
Neutro Abs: 5.2 10*3/uL (ref 1.7–7.7)
PLATELETS: ADEQUATE 10*3/uL (ref 150–400)
RBC: 4.37 MIL/uL (ref 3.87–5.11)
RDW: 15.3 % (ref 11.5–15.5)
WBC: 6.9 10*3/uL (ref 4.0–10.5)

## 2015-10-04 NOTE — Progress Notes (Signed)
Occupational Therapy Treatment Patient Details Name: Andrea Berry MRN: [id number] DOB: 1966-01-01 Today's Date: 10/04/2015    History of present illness 50 y.o. female initially presenting with Lt UE weakness and numbness. 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 PMH: legally blind, hypertension, lupus, depression, CVA 2008.   OT comments  Pt performed ADLs in session. Updated d/c plan to SNF. Will continue to follow acutely.  Follow Up Recommendations  Supervision/Assistance - 24 hour;SNF    Equipment Recommendations  3 in 1 bedside comode    Recommendations for Other Services      Precautions / Restrictions Precautions Precautions: Fall;Other (comment) (Lt hemiparesis) Precaution Comments: Legally blind Restrictions Weight Bearing Restrictions: No       Mobility Bed Mobility Overal bed mobility: Needs Assistance Bed Mobility: Supine to Sit;Sit to Supine     Supine to sit: Supervision Sit to supine: Mod assist   General bed mobility comments: Min assist to help with LE to return to bed as well as assist to scoot HOB (trendelenburg position also used) and assisted in better positioning pt in bed.  Transfers Overall transfer level: Needs assistance Equipment used: Rolling walker (2 wheeled) Transfers: Sit to/from Stand Sit to Stand: Min guard              Balance    Min guard-sit to stand transfer from bed with RW in front.                                ADL Overall ADL's : Needs assistance/impaired     Grooming: Applying deodorant;Brushing hair;Moderate assistance;Sitting Grooming Details (indicate cue type and reason): sitting EOB Upper Body Bathing: Maximal assistance   Lower Body Bathing: Sit to/from stand;Maximal assistance Lower Body Bathing Details (indicate cue type and reason): washed bottom/peri area Upper Body Dressing : Sitting;Maximal assistance       Toilet Transfer: Min guard;RW (sit to stand from bed)             General  ADL Comments: Educated on technique to help get under left arm as well as UB dressing technique. Educated on pt positioning LUE appropriately for bed mobility and talked with her about how she can use LUE as support.      Vision                     Perception     Praxis      Cognition  Awake/Alert Behavior During Therapy: [facility] for tasks assessed/performed;Flat affect (flat affect most of session) Overall Cognitive Status: Within Functional Limits for tasks assessed                       Extremity/Trunk Assessment               Exercises     Shoulder Instructions       General Comments      Pertinent Vitals/ Pain       Pain Assessment: 0-10 Pain Score: 6  Pain Location: left arm Pain Intervention(s): Repositioned;Monitored during session  Home Living                                          Prior Functioning/Environment              Frequency Min 2X/week  Progress Toward Goals  OT Goals(current goals can now be found in the care plan section)  Progress towards OT goals: Progressing toward goals  Acute Rehab OT Goals OT Goal Formulation: With patient Time For Goal Achievement: 10/07/15 Potential to Achieve Goals: Good ADL Goals Pt Will Perform Grooming: with set-up;sitting Pt Will Perform Upper Body Bathing: with set-up;with supervision;sitting Pt Will Perform Lower Body Bathing: with min guard assist;sit to/from stand Pt Will Transfer to Toilet: with min assist;bedside commode;stand pivot transfer Pt Will Perform Toileting - Clothing Manipulation and hygiene: with min assist;sit to/from stand Additional ADL Goal #1: Use LUE as funcitonal assist for ADL  Plan Discharge plan remains appropriate    Co-evaluation                 End of Session Equipment Utilized During Treatment: Gait belt;Rolling walker   Activity Tolerance Patient tolerated treatment well   Patient Left in bed;with bed alarm set;with  call bell/phone within reach   Nurse Communication          Time: [id number] OT Time Calculation (min): 21 min  Charges: OT General Charges $OT Visit: 1 Procedure OT Treatments $Self Care/Home Management : 8-22 mins  Benito Mccreedy OTR/L [id number] 10/04/2015, 10:07 AM

## 2015-10-04 NOTE — Progress Notes (Signed)
[facility]                                                                              Patient Demographics  Andrea Berry, is a 50 y.o. female, DOB - 1965-09-19, FT:[id number]  Admit date - 09/21/2015   Admitting Physician Reubin Milan, MD  Outpatient Primary MD for the patient is [name], NP  LOS - 12   Chief Complaint  Patient presents with  . Extremity Weakness       Brief HPI   Pleasant 50 year old African-American female with history of CVA in the past, hypertension, GERD, anxiety, depression, lupus with legal blindness, who is chronically on prednisone presented to the hospital with chief complaints of left-sided weakness are more than leg, in the ER she was seen by neurology and workup was consistent with Devic's disease. She was started on IV Solu-Medrol along with plasma exchange and admitted to [facility] service. She has minimal improvement in her left arm strength, plan to complete her plasma exchange treatments on 09/30/2015    Assessment & Plan   Severe left arm, leg weakness:Secondary to Devic's disease, improving - Per patient, no significant improvement with Solu-Medrol and the plasmapheresis, feels slightly better after first dose of IVIG - Neurology consulted. Patient was placed on IV Solu-Medrol total of 5 doses stop date 09/26/2015 and plasma exchange every other day for a total 5 runs, finished on 09/30/2015  - Continue supportive care with PT OT - Patient was seen by neurology again on 1/20, at this time recommending IVIG, started 1/21, day #4  today . Follow recommendations per neurology.   Transient mild Hypotension with bradycardia, chest pain on 1/18 - Taper prednisone, slowly taper to 5 mg maintenance dose. - EKG showed anterolateral T-wave inversions, similar to prior EKG on 1/16 and 1/11. 2-D echo on 1/12 had shown EF of [id number], grade 1 diastolic dysfunction   History of lupus. On prednisone at home - Patient was placed  on IV Solu-Medrol, along with Imuran. Outpatient follow-up with Dr. Estanislado Pandy postdischarge. -Continue prednisone, tapered slowly to maintenance dose of 5 mg   History of TIA/stroke in the past. No acute issues.   Prolonged QTC upon admission. IV magnesium, low-dose beta blocker, monitor on telemetry. Resolved on repeat EKG   Dehydration with tachycardia. Hydrated, and improved, stable echogram.   GERD. On PPI.  Leuokocytosis due to steroids. Afebrile monitor.  Low TSH. Could be sick euthyroid  Steroid induced hyperglycemia - Place on sliding scale insulin  Code Status: Full CODE STATUS  Family Communication: Discussed in detail with the patient, all imaging results, lab results explained to the patient and patient's son, Andrea Berry at the bedside   Disposition Plan: Will likely need skilled nursing facility, neurology recommendations needed  Time Spent in minutes 15 minutes  Procedures   CT head, MRI brain and C-spine consistent with Devic's disease.  Lumbar puncture not consistent with meningitis   Right IJ venous dialysis catheter placed by IR on 09/23/2015  TTE   - Left ventricle: The cavity size was normal. Wall thickness wasincreased in a pattern of moderate to severe LVH. Systolicfunction was vigorous.  The estimated ejection fraction was in the range of 65% to 70%. There was dynamic obstruction in the mid cavity, with a peak velocity of 314 cm/sec and a peak gradient of39 mm Hg. Wall motion was normal; there were no regional wall motion abnormalities. Doppler parameters are consistent with abnormal left ventricular relaxation (grade 1 diastolic dysfunction). - Left atrium: The atrium was mildly dilated. - Pericardium, extracardiac: A small pericardial effusion was identified circumferential to the heart  Consults   Neurology  DVT Prophylaxis  Lovenox  Medications  Scheduled Meds: .  stroke: mapping our early stages of recovery book   Does not apply Once  .  azaTHIOprine  50 mg Oral Daily  . docusate sodium  100 mg Oral BID  . enoxaparin (LOVENOX) injection  40 mg Subcutaneous Q0600  . Immune Globulin 10%  400 mg/kg Intravenous Q24 Hr x 5  . insulin aspart  0-15 Units Subcutaneous TID WC  . insulin aspart  0-5 Units Subcutaneous QHS  . multivitamin with minerals  1 tablet Oral Daily  . pantoprazole  40 mg Oral Daily  . polyethylene glycol  17 g Oral Daily  . predniSONE  30 mg Oral Q breakfast  . pregabalin  75 mg Oral TID  . sodium chloride  3 mL Intravenous Q12H  . sodium chloride  3 mL Intravenous Q12H   Continuous Infusions:   PRN Meds:.sodium chloride, bisacodyl, LORazepam, morphine injection, nitroGLYCERIN, ondansetron **OR** ondansetron (ZOFRAN) IV, oxyCODONE, senna-docusate, sodium chloride   Antibiotics   Anti-infectives    Start     Dose/Rate Route Frequency Ordered Stop   09/23/15 0815  ceFAZolin (ANCEF) IVPB 2 g/50 mL premix     2 g 100 mL/hr over 30 Minutes Intravenous  Once 09/23/15 0814 09/23/15 [id number]        Subjective:   Andrea Berry was seen and examined today. Left-sided weakness improving, left lower leg weakness is improving however still has significant weakness in the left arm. Patient feels quite hopeful today. Patient  denies any chest pain, shortness of breath, dizziness abdominal pain, N/V/D/C. No fevers or chills. Son at the bedside  Objective:   Blood pressure 152/84, pulse 108, temperature 98.6 F (37 C), temperature source Oral, resp. rate 20, height 5\' 1"  (1.549 m), weight 87.272 kg (192 lb 6.4 oz), SpO2 97 %.  Wt Readings from Last 3 Encounters:  09/30/15 87.272 kg (192 lb 6.4 oz)  02/08/14 73.199 kg (161 lb 6 oz)  01/29/14 75.297 kg (166 lb)     Intake/Output Summary (Last 24 hours) at 10/04/15 1222 Last data filed at 10/04/15 [id number]  Gross per 24 hour  Intake    240 ml  Output      0 ml  Net    240 ml    Exam  General: Alert and oriented x 3, NAD  HEENT:  PERRLA, EOMI  Neck:  Supple  CVS: S1 S2clear, RRR  Respiratory: CTAB  Abdomen: Soft, NT, NBS  Ext: no cyanosis clubbing or edema  Neuro: left arm strength 1/5, left leg 3-4/5, legally blind  Skin: No rashes  Psych: Normal affect and demeanor, alert and oriented x3    Data Review   Micro Results No results found for this or any previous visit (from the past 240 hour(s)).  Radiology Reports Dg Chest 2 View  09/22/2015  CLINICAL DATA:  TIA. EXAM: CHEST  2 VIEW COMPARISON:  04/29/2012 FINDINGS: Lung volumes are low. Cardiomegaly is unchanged allowing for differences in technique. Minimal  bibasilar subsegmental atelectasis no pulmonary edema, consolidation, pleural effusion or pneumothorax. No acute osseous abnormalities are seen. IMPRESSION: Low lung volumes with stable cardiomegaly. Electronically Signed   By: Jeb Levering M.D.   On: 09/22/2015 03:36   Ct Head Wo Contrast  09/21/2015  CLINICAL DATA:  Acute left-sided weakness. EXAM: CT HEAD WITHOUT CONTRAST TECHNIQUE: Contiguous axial images were obtained from the base of the skull through the vertex without intravenous contrast. COMPARISON:  CT scan of June 24, 2014. FINDINGS: Bony calvarium appears intact. No mass effect or midline shift is noted. Ventricular size is within normal limits. There is no evidence of mass lesion, hemorrhage or acute infarction. IMPRESSION: Normal head CT. Electronically Signed   By: Marijo Conception, M.D.   On: 09/21/2015 16:27   Mr Brain Wo Contrast  09/21/2015  CLINICAL DATA:  Left arm numbness and pain. Neck pain. Stroke versus cervical spine disease. EXAM: MRI HEAD WITHOUT CONTRAST MRI CERVICAL SPINE WITHOUT CONTRAST TECHNIQUE: Multiplanar, multiecho pulse sequences of the brain and surrounding structures, and cervical spine, to include the craniocervical junction and cervicothoracic junction, were obtained without intravenous contrast. COMPARISON:  Head CT earlier same day.  MRI 06/24/2014 FINDINGS: MRI HEAD FINDINGS  There is a 1 cm region of T2 and FLAIR abnormality within the left thalamus. No restricted diffusion presently. On the old MRI, there appear to be an old small vessel insult in this location. Elsewhere, the cerebral hemispheres show a few punctate foci of T2 and FLAIR signal in the deep white matter. No cortical or large vessel territory insult. No mass lesion, hemorrhage, hydrocephalus or extra-axial collection. No pituitary mass. No inflammatory sinus disease. No skull or skullbase lesion. MRI CERVICAL SPINE FINDINGS The cervical spinal cord is diffusely abnormal showing enlargement and inter medullary T2 signal. This extends from the obex all the way to the C7 level. This is consistent with acute deep mild is a shin or myelitis. There are no degenerative changes. There is no compressive pathology. IMPRESSION: 1 cm region of abnormal T2 and FLAIR signal within the left thalamus not consistent with acute infarction. This could be an acute focus of demyelination or conceivably but less likely a late subacute infarction. Diffusely abnormal cervical spinal cord which is swollen and shows diffuse abnormal T2 signal. This is likely to represent acute demyelination/neuromyelitis optica (Devic's disease) or acute transverse myelitis. Quite likely that the findings of the left thalamus in the spinal cord are due to the same pathology. Electronically Signed   By: Nelson Chimes M.D.   On: 09/21/2015 20:41   Mr Jeri Cos Contrast  09/22/2015  CLINICAL DATA:  50 yo female with his history of bilateral optic neuritis now with longitudinally extensive transverse myelitis. Weakness of arms and legs. EXAM: MRI CERVICAL SPINE WITH CONTRAST; MRI HEAD WITH CONTRAST TECHNIQUE: Multiplanar and multiecho pulse sequences of the cervical spine, to include the craniocervical junction and cervicothoracic junction, were obtained according to standard protocol with intravenous contrast.; Multiplanar, multiecho pulse sequences of the brain and  surrounding structures were obtained according to standard protocol with intravenous contrast CONTRAST:  27mL MULTIHANCE GADOBENATE DIMEGLUMINE 529 MG/ML IV SOLN COMPARISON:  Noncontrast brain and cervical spine from 07/21/2016. FINDINGS: Post infusion images of the brain demonstrates no abnormal enhancement. LEFT thalamic lesion, T2 and FLAIR hyperintense, probably represents an atypical area of demyelination, but non acute, given the lack of restricted diffusion and absence of blood brain barrier breakdown. In the cervical spine, there is extensive postcontrast enhancement, corresponding to  the T2 and FLAIR hyperintensity on yesterday's exam, extending over multiple cervical segments, from the foramen magnum through C7. This enhancement involves both gray and white matter. There is greater involvement of the LEFT hemicord. In the setting of a patient with history of optic neuritis, the findings are consistent with Devic's disease. No features specific for optic neuritis can be observed on post infusion imaging through the brain, non dedicated orbit study. IMPRESSION: No abnormal intracranial enhancement is documented. Heterogeneous enhancement of the enlarged cervical cord segments, from foramen magnum through C7, involving both gray and white matter, consistent with Devic's disease. Electronically Signed   By: Staci Righter M.D.   On: 09/22/2015 12:39   Mr Cervical Spine Wo Contrast  09/21/2015  CLINICAL DATA:  Left arm numbness and pain. Neck pain. Stroke versus cervical spine disease. EXAM: MRI HEAD WITHOUT CONTRAST MRI CERVICAL SPINE WITHOUT CONTRAST TECHNIQUE: Multiplanar, multiecho pulse sequences of the brain and surrounding structures, and cervical spine, to include the craniocervical junction and cervicothoracic junction, were obtained without intravenous contrast. COMPARISON:  Head CT earlier same day.  MRI 06/24/2014 FINDINGS: MRI HEAD FINDINGS There is a 1 cm region of T2 and FLAIR abnormality  within the left thalamus. No restricted diffusion presently. On the old MRI, there appear to be an old small vessel insult in this location. Elsewhere, the cerebral hemispheres show a few punctate foci of T2 and FLAIR signal in the deep white matter. No cortical or large vessel territory insult. No mass lesion, hemorrhage, hydrocephalus or extra-axial collection. No pituitary mass. No inflammatory sinus disease. No skull or skullbase lesion. MRI CERVICAL SPINE FINDINGS The cervical spinal cord is diffusely abnormal showing enlargement and inter medullary T2 signal. This extends from the obex all the way to the C7 level. This is consistent with acute deep mild is a shin or myelitis. There are no degenerative changes. There is no compressive pathology. IMPRESSION: 1 cm region of abnormal T2 and FLAIR signal within the left thalamus not consistent with acute infarction. This could be an acute focus of demyelination or conceivably but less likely a late subacute infarction. Diffusely abnormal cervical spinal cord which is swollen and shows diffuse abnormal T2 signal. This is likely to represent acute demyelination/neuromyelitis optica (Devic's disease) or acute transverse myelitis. Quite likely that the findings of the left thalamus in the spinal cord are due to the same pathology. Electronically Signed   By: Nelson Chimes M.D.   On: 09/21/2015 20:41   Mr Cervical Spine W Contrast  09/22/2015  CLINICAL DATA:  50 yo female with his history of bilateral optic neuritis now with longitudinally extensive transverse myelitis. Weakness of arms and legs. EXAM: MRI CERVICAL SPINE WITH CONTRAST; MRI HEAD WITH CONTRAST TECHNIQUE: Multiplanar and multiecho pulse sequences of the cervical spine, to include the craniocervical junction and cervicothoracic junction, were obtained according to standard protocol with intravenous contrast.; Multiplanar, multiecho pulse sequences of the brain and surrounding structures were obtained  according to standard protocol with intravenous contrast CONTRAST:  34mL MULTIHANCE GADOBENATE DIMEGLUMINE 529 MG/ML IV SOLN COMPARISON:  Noncontrast brain and cervical spine from 07/21/2016. FINDINGS: Post infusion images of the brain demonstrates no abnormal enhancement. LEFT thalamic lesion, T2 and FLAIR hyperintense, probably represents an atypical area of demyelination, but non acute, given the lack of restricted diffusion and absence of blood brain barrier breakdown. In the cervical spine, there is extensive postcontrast enhancement, corresponding to the T2 and FLAIR hyperintensity on yesterday's exam, extending over multiple cervical segments, from  the foramen magnum through C7. This enhancement involves both gray and white matter. There is greater involvement of the LEFT hemicord. In the setting of a patient with history of optic neuritis, the findings are consistent with Devic's disease. No features specific for optic neuritis can be observed on post infusion imaging through the brain, non dedicated orbit study. IMPRESSION: No abnormal intracranial enhancement is documented. Heterogeneous enhancement of the enlarged cervical cord segments, from foramen magnum through C7, involving both gray and white matter, consistent with Devic's disease. Electronically Signed   By: Staci Righter M.D.   On: 09/22/2015 12:39   Ir Fluoro Guide Cv Line Right  09/23/2015  CLINICAL DATA:  Transverse myelitis.  Plasmapheresis. EXAM: TUNNELED DIALYSIS CATHETER PLACEMENT, ULTRASOUND GUIDANCE FOR VASCULAR ACCESS FLUOROSCOPY TIME:  24 seconds MEDICATIONS AND MEDICAL HISTORY: Versed 1.5 mg, Fentanyl 75 mcg. Additional Medications: As antibiotic prophylaxis, Ancef was ordered pre-procedure and administered intravenously within one hour of incision. ANESTHESIA/SEDATION: Moderate sedation time: 20 minutes CONTRAST:  None PROCEDURE: The procedure, risks, benefits, and alternatives were explained to the patient. Questions regarding  the procedure were encouraged and answered. The patient understands and consents to the procedure. The right neck was prepped with ChloraPrep in a sterile fashion, and a sterile drape was applied covering the operative field. A sterile gown and sterile gloves were used for the procedure. 1% lidocaine into the skin and subcutaneous tissue. The right jugular vein was noted to be patent initially with ultrasound. Under sonographic guidance, a micropuncture needle was inserted into the right IJ vein (Ultrasound and fluoroscopic image documentation was performed). It was removed over an 018 wire which was upsized to an Amplatz. This was advanced into the IVC. A small incision was made in the right upper chest. The tunneling device was utilized to advance the 23 centimeter tip to cuff catheter from the chest incision and out the neck incision. A peel-away sheath was advanced over the Amplatz wire. The leading edge of the catheter was then advanced through the peel-away sheath. The peel-away sheath was removed. It was flushed and instilled with heparin. The chest incision was closed with a 0 Prolene pursestring stitch. The neck incision was closed with a 4-0 Vicryl subcuticular stitch. COMPLICATIONS: None FINDINGS: The image demonstrates placement of a tunneled dialysis catheter with its tip in the right atrium. IMPRESSION: Successful right IJ vein tunneled dialysis catheter with its tip in the right atrium. Electronically Signed   By: Marybelle Killings M.D.   On: 09/23/2015 12:33   Ir US Guide Vasc Access Right  09/23/2015  CLINICAL DATA:  Transverse myelitis.  Plasmapheresis. EXAM: TUNNELED DIALYSIS CATHETER PLACEMENT, ULTRASOUND GUIDANCE FOR VASCULAR ACCESS FLUOROSCOPY TIME:  24 seconds MEDICATIONS AND MEDICAL HISTORY: Versed 1.5 mg, Fentanyl 75 mcg. Additional Medications: As antibiotic prophylaxis, Ancef was ordered pre-procedure and administered intravenously within one hour of incision. ANESTHESIA/SEDATION: Moderate  sedation time: 20 minutes CONTRAST:  None PROCEDURE: The procedure, risks, benefits, and alternatives were explained to the patient. Questions regarding the procedure were encouraged and answered. The patient understands and consents to the procedure. The right neck was prepped with ChloraPrep in a sterile fashion, and a sterile drape was applied covering the operative field. A sterile gown and sterile gloves were used for the procedure. 1% lidocaine into the skin and subcutaneous tissue. The right jugular vein was noted to be patent initially with ultrasound. Under sonographic guidance, a micropuncture needle was inserted into the right IJ vein (Ultrasound and fluoroscopic image documentation was performed). It  was removed over an 018 wire which was upsized to an Amplatz. This was advanced into the IVC. A small incision was made in the right upper chest. The tunneling device was utilized to advance the 23 centimeter tip to cuff catheter from the chest incision and out the neck incision. A peel-away sheath was advanced over the Amplatz wire. The leading edge of the catheter was then advanced through the peel-away sheath. The peel-away sheath was removed. It was flushed and instilled with heparin. The chest incision was closed with a 0 Prolene pursestring stitch. The neck incision was closed with a 4-0 Vicryl subcuticular stitch. COMPLICATIONS: None FINDINGS: The image demonstrates placement of a tunneled dialysis catheter with its tip in the right atrium. IMPRESSION: Successful right IJ vein tunneled dialysis catheter with its tip in the right atrium. Electronically Signed   By: Marybelle Killings M.D.   On: 09/23/2015 12:33   Dg Fluoro Guide Lumbar Puncture  09/22/2015  CLINICAL DATA:  Transverse myelitis. EXAM: DIAGNOSTIC LUMBAR PUNCTURE UNDER FLUOROSCOPIC GUIDANCE FLUOROSCOPY TIME:  Fluoroscopy Time (in minutes and seconds): 0 minutes, 37 seconds Number of Acquired Images:  0 PROCEDURE: I discussed the risks  (including hemorrhage, infection, headache, and nerve damage, among others), benefits, and alternatives to fluoroscopically guided lumbar puncture with the patient. We specifically discussed the high technical likelihood of success of the procedure. The patient understood and elected to undergo the procedure. Standard time-out was employed. Following sterile skin prep and local anesthetic administration consisting of 1 percent lidocaine, a 22 gauge spinal needle was advanced without difficulty into the thecal sac at the at the L4-5 level. Clear CSF was returned. Opening pressure was 17 cm of water. 13 cc of clear CSF was collected. The needle was subsequently removed and the skin cleansed and bandaged. No immediate complications were observed. IMPRESSION: 1. Technically successful fluoroscopically guided lumbar puncture at the L4-5 level. No complicating feature observed. Opening pressure 17 cm of water. Electronically Signed   By: Van Clines M.D.   On: 09/22/2015 09:15    CBC  Recent Labs Lab 09/30/15 0332 09/30/15 2241 10/01/15 0501 10/02/15 0350 10/03/15 0319 10/04/15 0616  WBC 8.9  --  9.0 8.4 8.3 6.9  HGB 11.2* 12.2 11.1* 10.3* 9.9* 10.2*  HCT 34.7* 36.0 33.8* 31.9* 30.8* 31.7*  PLT 132*  --  147* 149* 181 PLATELET CLUMPS NOTED ON SMEAR, COUNT APPEARS ADEQUATE  MCV 72.1*  --  71.8* 71.7* 73.3* 72.5*  MCH 23.3*  --  23.6* 23.1* 23.6* 23.3*  MCHC 32.3  --  32.8 32.3 32.1 32.2  RDW 14.3  --  14.3 14.3 15.2 15.3  LYMPHSABS 2.0  --  2.5 1.7 1.1 1.2  MONOABS 0.6  --  0.7 0.6 0.5 0.5  EOSABS 0.0  --  0.1 0.1 0.0 0.0  BASOSABS 0.0  --  0.0 0.0 0.0 0.0    Chemistries   Recent Labs Lab 09/28/15 0430 09/28/15 1002 09/30/15 2241  NA  --  142 143  K  --  3.5 3.3*  CL  --  103 101  GLUCOSE  --  159* 92  BUN  --  21* 12  CREATININE 0.69 0.70 0.80   ------------------------------------------------------------------------------------------------------------------ estimated  creatinine clearance is 87.3 mL/min (by C-G formula based on Cr of 0.8). ------------------------------------------------------------------------------------------------------------------ No results for input(s): HGBA1C in the last 72 hours. ------------------------------------------------------------------------------------------------------------------ No results for input(s): CHOL, HDL, LDLCALC, TRIG, CHOLHDL, LDLDIRECT in the last 72 hours. ------------------------------------------------------------------------------------------------------------------ No results for input(s): TSH, T4TOTAL, T3FREE,  THYROIDAB in the last 72 hours.  Invalid input(s): FREET3 ------------------------------------------------------------------------------------------------------------------ No results for input(s): VITAMINB12, FOLATE, FERRITIN, TIBC, IRON, RETICCTPCT in the last 72 hours.  Coagulation profile No results for input(s): INR, PROTIME in the last 168 hours.  No results for input(s): DDIMER in the last 72 hours.  Cardiac Enzymes  Recent Labs Lab 09/28/15 2105 09/29/15 0716 09/29/15 1425  TROPONINI 0.04* <0.03 <0.03   ------------------------------------------------------------------------------------------------------------------ Invalid input(s): POCBNP   Recent Labs  10/03/15 0656 10/03/15 1104 10/03/15 1607 10/03/15 2116 10/04/15 0638 10/04/15 1122  GLUCAP 78 139* 171* 121* 70 123*     RAI,RIPUDEEP M.D. [facility] 10/04/2015, 12:22 PM  Pager: [phone] Between 7am to 7pm - call Pager - [phone]  After 7pm go to [email or web address] - password [email or web address]  Call night coverage person covering after 7pm

## 2015-10-05 LAB — CREATININE, SERUM: CREATININE: 0.72 mg/dL (ref 0.44–1.00)

## 2015-10-05 LAB — CBC WITH DIFFERENTIAL/PLATELET
BASOS ABS: 0 10*3/uL (ref 0.0–0.1)
BASOS PCT: 0 %
EOS ABS: 0 10*3/uL (ref 0.0–0.7)
Eosinophils Relative: 0 %
HEMATOCRIT: 32.5 % — AB (ref 36.0–46.0)
HEMOGLOBIN: 10.2 g/dL — AB (ref 12.0–15.0)
Lymphocytes Relative: 19 %
Lymphs Abs: 1.5 10*3/uL (ref 0.7–4.0)
MCH: 23.4 pg — ABNORMAL LOW (ref 26.0–34.0)
MCHC: 31.4 g/dL (ref 30.0–36.0)
MCV: 74.5 fL — ABNORMAL LOW (ref 78.0–100.0)
MONOS PCT: 8 %
Monocytes Absolute: 0.6 10*3/uL (ref 0.1–1.0)
NEUTROS ABS: 5.8 10*3/uL (ref 1.7–7.7)
NEUTROS PCT: 74 %
Platelets: 158 10*3/uL (ref 150–400)
RBC: 4.36 MIL/uL (ref 3.87–5.11)
RDW: 15.7 % — AB (ref 11.5–15.5)
WBC: 7.9 10*3/uL (ref 4.0–10.5)

## 2015-10-05 LAB — GLUCOSE, CAPILLARY
GLUCOSE-CAPILLARY: 83 mg/dL (ref 65–99)
GLUCOSE-CAPILLARY: 116 mg/dL — AB (ref 65–99)
Glucose-Capillary: 107 mg/dL — ABNORMAL HIGH (ref 65–99)

## 2015-10-05 MED ORDER — PREDNISONE 20 MG PO TABS
20.0000 mg | ORAL_TABLET | Freq: Every day | ORAL | Status: DC
  Administered 2015-10-06: 20 mg via ORAL
  Filled 2015-10-05: qty 1

## 2015-10-05 NOTE — Progress Notes (Signed)
NEURO HOSPITALIST PROGRESS NOTE   SUBJECTIVE:                                                                                                                        Lying comfortably in bed. Son at the bedside. Continues to stated that no much improvement so far, some flickering finger movements left hand. The right hand is numb. Received 5/5 dose of IVIG --with today being last dose. [place] + with titers 347.5  OBJECTIVE:                                                                                                                           Vital signs in last 24 hours: Temp:  [98.7 F (37.1 C)-99.3 F (37.4 C)] 98.7 F (37.1 C) (01/25 1018) Pulse Rate:  [88-106] 96 (01/25 1018) Resp:  [16-20] 18 (01/25 1018) BP: (116-134)/(64-82) 128/82 mmHg (01/25 1018) SpO2:  [97 %-100 %] 98 % (01/25 1018)  Intake/Output from previous day: 01/24 0701 - 01/25 0700 In: 54 [P.O.:420] Out: -  Intake/Output this shift:   Nutritional status: Diet renal/carb modified with fluid restriction Diet-HS Snack?: Nothing; Room service appropriate?: Yes; Fluid consistency:: Thin  Past Medical History  Diagnosis Date  . Hypertension   . Blind   . Lupus ([place])   . Kidney stones   . Shortness of breath     due to medication  . Depression   . GERD (gastroesophageal reflux disease)   . Headache(784.0)   . Arthritis   . Stroke [facility]) 2008    visually impaired     Neurologic Exam:  General: NAD Mental Status: Alert, oriented, thought content appropriate. Speech fluent without evidence of aphasia. Able to follow 3 step commands without difficulty. Cranial Nerves: II: Discs flat bilaterally; Visual fields grossly normal, pupils equal, round, reactive to light and accommodation III,IV, VI: ptosis not present, extra-ocular motions intact bilaterally V,VII: smile symmetric, facial light touch sensation normal bilaterally VIII: hearing normal bilaterally IX,X:  uvula rises symmetrically XI: bilateral shoulder shrug XII: midline tongue extension without atrophy or fasciculations Motor: Complete flaccid paralysis left UE with minimal motor movements left LE proximally. Right grip appear to be weak. Sensory: Pinprick and light touch decreased on the left Deep  Tendon Reflexes:  Right: Upper Extremity Left: Upper extremity   biceps (C-5 to C-6) 2/4 biceps (C-5 to C-6) 1/4 tricep (C7) 2/4triceps (C7) 1/4 Brachioradialis (C6) 2/4Brachioradialis (C6) 1/4  Lower Extremity Lower Extremity  quadriceps (L-2 to L-4) 2/4 quadriceps (L-2 to L-4) 1/4 Achilles (S1) 2/4Achilles (S1) 0/4  Plantars: Right: downgoingLeft: downgoing Cerebellar: normal finger-to-nose, normal heel-to-shin test on the right  Lab Results: Lab Results  Component Value Date/Time   CHOL 188 09/22/2015 03:44 AM   Lipid Panel No results for input(s): CHOL, TRIG, HDL, CHOLHDL, VLDL, LDLCALC in the last 72 hours.  Studies/Results: No results found.  MEDICATIONS                                                                                                                        Scheduled: .  stroke: mapping our early stages of recovery book   Does not apply Once  . azaTHIOprine  50 mg Oral Daily  . docusate sodium  100 mg Oral BID  . enoxaparin (LOVENOX) injection  40 mg Subcutaneous Q0600  . Immune Globulin 10%  400 mg/kg Intravenous Q24 Hr x 5  . insulin aspart  0-15 Units Subcutaneous TID WC  . insulin aspart  0-5 Units Subcutaneous QHS  . multivitamin with minerals  1 tablet Oral Daily  . pantoprazole  40 mg Oral Daily  . polyethylene glycol  17 g Oral Daily  . predniSONE  30 mg Oral Q breakfast  . pregabalin  75 mg Oral TID  . sodium chloride  3 mL Intravenous Q12H  . sodium chloride   3 mL Intravenous Q12H    ASSESSMENT/PLAN:                                                                                                           50 y/o female with SLE admitted with few days of acute onset left hemiplegia and enhanced MRI cervical spine demonstrating  longitudinally extensive TM. Now with coexistent newly diagnosed NMO  (titers >347). No clinical improvement after completion of high dose pulse methylprednisolone and 5 day PLEX. Patient and family are frustrated and this is totally understandable. Long discussion with patient and son.Decided to pursue 5 days of IVIG but I am not very optimistic about achieving significant clinica improvement. She will certainly need immunosuppressive therapy with Rituximab or cyclophosphamide but this is not offered here. She will need to go to either a tertiary center or outpatient neurology/reumathology to pursue this strategy. They agreed with this course of action. At this time will  be discharged to SNF and have further neurological follow up as out patient.   Etta Quill PA-C [facility] [phone]  10/05/2015, 10:24 AM

## 2015-10-05 NOTE — Evaluation (Signed)
Physical Therapy Evaluation Patient Details Name: Andrea Berry MRN: [id number] DOB: Jul 12, 1966 Today's Date: 10/05/2015   History of Present Illness  50 y.o. female initially presenting with Lt UE weakness and numbness. 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 PMH: legally blind, hypertension, lupus, depression, CVA 2008.  Clinical Impression  Pt progressing towards physical therapy goals. Pt was able to tolerate gait training with +2 mod assist and RW for support. Pt reports no increased pain, however endorses fatigue. Will continue to follow and progress as able per POC.     Follow Up Recommendations SNF;Supervision/Assistance - 24 hour    Equipment Recommendations   (TBD by next venue of care)    Recommendations for Other Services       Precautions / Restrictions Precautions Precautions: Fall Precaution Comments: Legally blind Restrictions Weight Bearing Restrictions: No      Mobility  Bed Mobility               General bed mobility comments: Pt received sitting up in the recliner. Back rest of recliner all the way down so pt almost supine. Assist required to elevate trunk into sitting position.   Transfers Overall transfer level: Needs assistance Equipment used: Rolling walker (2 wheeled) Transfers: Sit to/from Stand Sit to Stand: Min assist;Mod assist;+2 physical assistance         General transfer comment: Min to mod assist +2 for pt to power-up to full stand, position LUE on walker, and provide balance support for pt to bring RUE up onto walker.   Ambulation/Gait Ambulation/Gait assistance: Mod assist;+2 physical assistance Ambulation Distance (Feet): 20 Feet (10 feet x2 ) Assistive device: Rolling walker (2 wheeled) Gait Pattern/deviations: Step-to pattern;Decreased stride length;Trunk flexed Gait velocity: Decreased Gait velocity interpretation: Below normal speed for age/gender General Gait Details: Pt with heavy lean to the L side. Pt was cued for diagonal  step towards the front right wheel of the walker and pt was able to advance the RLE straight forward instead of to the L side. Pt was able to complete x2 bouts of ambulation with 1 seated rest break.   Stairs            Wheelchair Mobility    Modified Rankin (Stroke Patients Only)       Balance Overall balance assessment: Needs assistance Sitting-balance support: Feet supported;No upper extremity supported Sitting balance-Leahy Scale: Poor Sitting balance - Comments: Required UE support to maintain balance sitting in chair   Standing balance support: Bilateral upper extremity supported;During functional activity Standing balance-Leahy Scale: Zero Standing balance comment: Max assist required for dynamic standing balance.                              Pertinent Vitals/Pain Pain Assessment: Faces Faces Pain Scale: Hurts little more Pain Location: L arm mostly Pain Descriptors / Indicators: Aching Pain Intervention(s): Limited activity within patient's tolerance;Monitored during session;Repositioned    Home Living Family/patient expects to be discharged to:: Private residence Living Arrangements: Children                    Prior Function                 Hand Dominance        Extremity/Trunk Assessment                         Communication      Cognition Arousal/Alertness:  Awake/alert Behavior During Therapy: Flat affect Overall Cognitive Status: Within Functional Limits for tasks assessed                      General Comments      Exercises General Exercises - Lower Extremity Ankle Circles/Pumps: 10 reps Long Arc Quad: 10 reps      Assessment/Plan    PT Assessment    PT Diagnosis     PT Problem List    PT Treatment Interventions     PT Goals (Current goals can be found in the Care Plan section) Acute Rehab PT Goals Patient Stated Goal: return to being independent PT Goal Formulation: With patient Time  For Goal Achievement: 10/07/15 Potential to Achieve Goals: Fair    Frequency Min 3X/week   Barriers to discharge        Co-evaluation               End of Session Equipment Utilized During Treatment: Gait belt Activity Tolerance: Patient limited by fatigue Patient left: in chair;with call bell/phone within reach;with chair alarm set Nurse Communication: Mobility status         Time: [id number] PT Time Calculation (min) (ACUTE ONLY): 30 min   Charges:     PT Treatments $Gait Training: 23-37 mins   PT G Codes:        Rolinda Roan 2015/10/13, 2:07 PM   Rolinda Roan, PT, DPT Acute Rehabilitation Services Pager: [phone]

## 2015-10-05 NOTE — Progress Notes (Signed)
[facility]                                                                              Patient Demographics  Andrea Berry, is a 50 y.o. female, DOB - 11/26/1965, [id number]  Admit date - 09/21/2015   Admitting Physician Reubin Milan, MD  Outpatient Primary MD for the patient is [name], NP  LOS - 36   Chief Complaint  Patient presents with  . Extremity Weakness       Brief HPI and hospital course    Pleasant 50 year old African-American female with history of CVA in the past, hypertension, GERD, anxiety, depression, lupus with legal blindness, who is chronically on prednisone presented to the hospital with chief complaints of left-sided weakness are more than leg, in the ER she was seen by neurology and workup was consistent with Devic's disease. She was started on IV Solu-Medrol along with plasma exchange and admitted to [facility] service. She finished plasma exchange treatments on 09/30/2015 . Patient however did not have much improvement with Solu-Medrol or the plasmapheresis. Patient and family were frustrated, neurology started patient on IVIG on 1/21. She is finishing 5/5 dose of IVIG today. She has very minimal improvement in her left arm strength, however has some improvement in the left lower extremity.Plan to skilled nursing facility tomorrow a.m. as per request from the patient and her family.   Assessment & Plan   Severe left arm, leg weakness:Secondary to Devic's disease, improving somewhat - Per patient, continues to report no significant improvement with Solu-Medrol and the plasmapheresis, feels slightly better after first dose of IVIG - Neurology was consulted. Patient was placed on IV Solu-Medrol total of 5 doses, completed 09/26/2015 and plasma exchange every other day for a total 5 runs, finished on 09/30/2015 . - Patient was seen by neurology again on 1/20, at this time recommending IVIG, started 1/21, day #5  today . Follow  recommendations per neurology. - Continue supportive care with PT OT   Transient mild Hypotension with bradycardia, chest pain on 1/18 - Taper prednisone, slowly taper to 5 mg maintenance dose. - EKG showed anterolateral T-wave inversions, similar to prior EKG on 1/16 and 1/11. 2-D echo on 1/12 had shown EF of [id number], grade 1 diastolic dysfunction   History of lupus. On prednisone at home - Patient was placed on IV Solu-Medrol, along with Imuran. Outpatient follow-up with Dr. Estanislado Pandy postdischarge. -Continue prednisone, tapered slowly to maintenance dose of 5 mg   History of TIA/stroke in the past. No acute issues.   Prolonged QTC upon admission. IV magnesium, low-dose beta blocker, monitor on telemetry. Resolved on repeat EKG   Dehydration with tachycardia. Hydrated, and improved, stable echogram.   GERD. On PPI.  Leuokocytosis due to steroids. Afebrile monitor.  Low TSH. Could be sick euthyroid  Steroid induced hyperglycemia - Place on sliding scale insulin  Code Status: Full CODE STATUS  Family Communication: Discussed in detail with the patient, all imaging results, lab results explained to the patient and patient's son, Andrea Berry at the bedside   Disposition Plan: Discussed at length with the patient and her son at the bedside.  I also requested neurology to follow up today as patient will finish 5/5 IVIG today. Per recommendations, outpatient follow-up in neurology and further workup outpatient. Plan to DC to skilled nursing facility tomorrow a.m. as per request from the patient and her son at the bedside.  Time Spent in minutes 25 minutes  Procedures   CT head, MRI brain and C-spine consistent with Devic's disease.  Lumbar puncture not consistent with meningitis   Right IJ venous dialysis catheter placed by IR on 09/23/2015  TTE   - Left ventricle: The cavity size was normal. Wall thickness wasincreased in a pattern of moderate to severe LVH. Systolicfunction was  vigorous. The estimated ejection fraction was in the range of 65% to 70%. There was dynamic obstruction in the mid cavity, with a peak velocity of 314 cm/sec and a peak gradient of39 mm Hg. Wall motion was normal; there were no regional wall motion abnormalities. Doppler parameters are consistent with abnormal left ventricular relaxation (grade 1 diastolic dysfunction). - Left atrium: The atrium was mildly dilated. - Pericardium, extracardiac: A small pericardial effusion was identified circumferential to the heart  Consults   Neurology  DVT Prophylaxis  Lovenox  Medications  Scheduled Meds: .  stroke: mapping our early stages of recovery book   Does not apply Once  . azaTHIOprine  50 mg Oral Daily  . docusate sodium  100 mg Oral BID  . enoxaparin (LOVENOX) injection  40 mg Subcutaneous Q0600  . insulin aspart  0-15 Units Subcutaneous TID WC  . insulin aspart  0-5 Units Subcutaneous QHS  . multivitamin with minerals  1 tablet Oral Daily  . pantoprazole  40 mg Oral Daily  . polyethylene glycol  17 g Oral Daily  . predniSONE  30 mg Oral Q breakfast  . pregabalin  75 mg Oral TID  . sodium chloride  3 mL Intravenous Q12H  . sodium chloride  3 mL Intravenous Q12H   Continuous Infusions:   PRN Meds:.sodium chloride, bisacodyl, LORazepam, morphine injection, nitroGLYCERIN, ondansetron **OR** ondansetron (ZOFRAN) IV, oxyCODONE, senna-docusate, sodium chloride   Antibiotics   Anti-infectives    Start     Dose/Rate Route Frequency Ordered Stop   09/23/15 0815  ceFAZolin (ANCEF) IVPB 2 g/50 mL premix     2 g 100 mL/hr over 30 Minutes Intravenous  Once 09/23/15 0814 09/23/15 [id number]        Subjective:   Andrea Berry was seen and examined today. Left-sided arm weakness, persisting, left lower leg weakness is improving. Patient  denies any chest pain, shortness of breath, dizziness abdominal pain, N/V/D/C. No fevers or chills. Son at the bedside  Objective:   Blood pressure 128/82,  pulse 96, temperature 98.7 F (37.1 C), temperature source Oral, resp. rate 18, height 5\' 1"  (1.549 m), weight 87.272 kg (192 lb 6.4 oz), SpO2 98 %.  Wt Readings from Last 3 Encounters:  09/30/15 87.272 kg (192 lb 6.4 oz)  02/08/14 73.199 kg (161 lb 6 oz)  01/29/14 75.297 kg (166 lb)     Intake/Output Summary (Last 24 hours) at 10/05/15 1146 Last data filed at 10/04/15 1729  Gross per 24 hour  Intake    180 ml  Output      0 ml  Net    180 ml    Exam  General: Alert and oriented x 3, NAD  HEENT:  PERRLA, EOMI  Neck: Supple  CVS: S1 S2clear, RRR  Respiratory: CTAB  Abdomen: Soft, NT, NBS  Ext: no cyanosis clubbing  or edema  Neuro: left arm strength 1/5, left leg 3-4/5, legally blind  Skin: No rashes  Psych: Normal affect and demeanor, alert and oriented x3    Data Review   Micro Results No results found for this or any previous visit (from the past 240 hour(s)).  Radiology Reports Dg Chest 2 View  09/22/2015  CLINICAL DATA:  TIA. EXAM: CHEST  2 VIEW COMPARISON:  04/29/2012 FINDINGS: Lung volumes are low. Cardiomegaly is unchanged allowing for differences in technique. Minimal bibasilar subsegmental atelectasis no pulmonary edema, consolidation, pleural effusion or pneumothorax. No acute osseous abnormalities are seen. IMPRESSION: Low lung volumes with stable cardiomegaly. Electronically Signed   By: Jeb Levering M.D.   On: 09/22/2015 03:36   Ct Head Wo Contrast  09/21/2015  CLINICAL DATA:  Acute left-sided weakness. EXAM: CT HEAD WITHOUT CONTRAST TECHNIQUE: Contiguous axial images were obtained from the base of the skull through the vertex without intravenous contrast. COMPARISON:  CT scan of June 24, 2014. FINDINGS: Bony calvarium appears intact. No mass effect or midline shift is noted. Ventricular size is within normal limits. There is no evidence of mass lesion, hemorrhage or acute infarction. IMPRESSION: Normal head CT. Electronically Signed   By: Marijo Conception, M.D.   On: 09/21/2015 16:27   Mr Brain Wo Contrast  09/21/2015  CLINICAL DATA:  Left arm numbness and pain. Neck pain. Stroke versus cervical spine disease. EXAM: MRI HEAD WITHOUT CONTRAST MRI CERVICAL SPINE WITHOUT CONTRAST TECHNIQUE: Multiplanar, multiecho pulse sequences of the brain and surrounding structures, and cervical spine, to include the craniocervical junction and cervicothoracic junction, were obtained without intravenous contrast. COMPARISON:  Head CT earlier same day.  MRI 06/24/2014 FINDINGS: MRI HEAD FINDINGS There is a 1 cm region of T2 and FLAIR abnormality within the left thalamus. No restricted diffusion presently. On the old MRI, there appear to be an old small vessel insult in this location. Elsewhere, the cerebral hemispheres show a few punctate foci of T2 and FLAIR signal in the deep white matter. No cortical or large vessel territory insult. No mass lesion, hemorrhage, hydrocephalus or extra-axial collection. No pituitary mass. No inflammatory sinus disease. No skull or skullbase lesion. MRI CERVICAL SPINE FINDINGS The cervical spinal cord is diffusely abnormal showing enlargement and inter medullary T2 signal. This extends from the obex all the way to the C7 level. This is consistent with acute deep mild is a shin or myelitis. There are no degenerative changes. There is no compressive pathology. IMPRESSION: 1 cm region of abnormal T2 and FLAIR signal within the left thalamus not consistent with acute infarction. This could be an acute focus of demyelination or conceivably but less likely a late subacute infarction. Diffusely abnormal cervical spinal cord which is swollen and shows diffuse abnormal T2 signal. This is likely to represent acute demyelination/neuromyelitis optica (Devic's disease) or acute transverse myelitis. Quite likely that the findings of the left thalamus in the spinal cord are due to the same pathology. Electronically Signed   By: Nelson Chimes M.D.   On:  09/21/2015 20:41   Mr Jeri Cos Contrast  09/22/2015  CLINICAL DATA:  50 yo female with his history of bilateral optic neuritis now with longitudinally extensive transverse myelitis. Weakness of arms and legs. EXAM: MRI CERVICAL SPINE WITH CONTRAST; MRI HEAD WITH CONTRAST TECHNIQUE: Multiplanar and multiecho pulse sequences of the cervical spine, to include the craniocervical junction and cervicothoracic junction, were obtained according to standard protocol with intravenous contrast.; Multiplanar, multiecho pulse sequences  of the brain and surrounding structures were obtained according to standard protocol with intravenous contrast CONTRAST:  14mL MULTIHANCE GADOBENATE DIMEGLUMINE 529 MG/ML IV SOLN COMPARISON:  Noncontrast brain and cervical spine from 07/21/2016. FINDINGS: Post infusion images of the brain demonstrates no abnormal enhancement. LEFT thalamic lesion, T2 and FLAIR hyperintense, probably represents an atypical area of demyelination, but non acute, given the lack of restricted diffusion and absence of blood brain barrier breakdown. In the cervical spine, there is extensive postcontrast enhancement, corresponding to the T2 and FLAIR hyperintensity on yesterday's exam, extending over multiple cervical segments, from the foramen magnum through C7. This enhancement involves both gray and white matter. There is greater involvement of the LEFT hemicord. In the setting of a patient with history of optic neuritis, the findings are consistent with Devic's disease. No features specific for optic neuritis can be observed on post infusion imaging through the brain, non dedicated orbit study. IMPRESSION: No abnormal intracranial enhancement is documented. Heterogeneous enhancement of the enlarged cervical cord segments, from foramen magnum through C7, involving both gray and white matter, consistent with Devic's disease. Electronically Signed   By: Staci Righter M.D.   On: 09/22/2015 12:39   Mr Cervical Spine Wo  Contrast  09/21/2015  CLINICAL DATA:  Left arm numbness and pain. Neck pain. Stroke versus cervical spine disease. EXAM: MRI HEAD WITHOUT CONTRAST MRI CERVICAL SPINE WITHOUT CONTRAST TECHNIQUE: Multiplanar, multiecho pulse sequences of the brain and surrounding structures, and cervical spine, to include the craniocervical junction and cervicothoracic junction, were obtained without intravenous contrast. COMPARISON:  Head CT earlier same day.  MRI 06/24/2014 FINDINGS: MRI HEAD FINDINGS There is a 1 cm region of T2 and FLAIR abnormality within the left thalamus. No restricted diffusion presently. On the old MRI, there appear to be an old small vessel insult in this location. Elsewhere, the cerebral hemispheres show a few punctate foci of T2 and FLAIR signal in the deep white matter. No cortical or large vessel territory insult. No mass lesion, hemorrhage, hydrocephalus or extra-axial collection. No pituitary mass. No inflammatory sinus disease. No skull or skullbase lesion. MRI CERVICAL SPINE FINDINGS The cervical spinal cord is diffusely abnormal showing enlargement and inter medullary T2 signal. This extends from the obex all the way to the C7 level. This is consistent with acute deep mild is a shin or myelitis. There are no degenerative changes. There is no compressive pathology. IMPRESSION: 1 cm region of abnormal T2 and FLAIR signal within the left thalamus not consistent with acute infarction. This could be an acute focus of demyelination or conceivably but less likely a late subacute infarction. Diffusely abnormal cervical spinal cord which is swollen and shows diffuse abnormal T2 signal. This is likely to represent acute demyelination/neuromyelitis optica (Devic's disease) or acute transverse myelitis. Quite likely that the findings of the left thalamus in the spinal cord are due to the same pathology. Electronically Signed   By: Nelson Chimes M.D.   On: 09/21/2015 20:41   Mr Cervical Spine W  Contrast  09/22/2015  CLINICAL DATA:  50 yo female with his history of bilateral optic neuritis now with longitudinally extensive transverse myelitis. Weakness of arms and legs. EXAM: MRI CERVICAL SPINE WITH CONTRAST; MRI HEAD WITH CONTRAST TECHNIQUE: Multiplanar and multiecho pulse sequences of the cervical spine, to include the craniocervical junction and cervicothoracic junction, were obtained according to standard protocol with intravenous contrast.; Multiplanar, multiecho pulse sequences of the brain and surrounding structures were obtained according to standard protocol with intravenous  contrast CONTRAST:  82mL MULTIHANCE GADOBENATE DIMEGLUMINE 529 MG/ML IV SOLN COMPARISON:  Noncontrast brain and cervical spine from 07/21/2016. FINDINGS: Post infusion images of the brain demonstrates no abnormal enhancement. LEFT thalamic lesion, T2 and FLAIR hyperintense, probably represents an atypical area of demyelination, but non acute, given the lack of restricted diffusion and absence of blood brain barrier breakdown. In the cervical spine, there is extensive postcontrast enhancement, corresponding to the T2 and FLAIR hyperintensity on yesterday's exam, extending over multiple cervical segments, from the foramen magnum through C7. This enhancement involves both gray and white matter. There is greater involvement of the LEFT hemicord. In the setting of a patient with history of optic neuritis, the findings are consistent with Devic's disease. No features specific for optic neuritis can be observed on post infusion imaging through the brain, non dedicated orbit study. IMPRESSION: No abnormal intracranial enhancement is documented. Heterogeneous enhancement of the enlarged cervical cord segments, from foramen magnum through C7, involving both gray and white matter, consistent with Devic's disease. Electronically Signed   By: Staci Righter M.D.   On: 09/22/2015 12:39   Ir Fluoro Guide Cv Line Right  09/23/2015  CLINICAL  DATA:  Transverse myelitis.  Plasmapheresis. EXAM: TUNNELED DIALYSIS CATHETER PLACEMENT, ULTRASOUND GUIDANCE FOR VASCULAR ACCESS FLUOROSCOPY TIME:  24 seconds MEDICATIONS AND MEDICAL HISTORY: Versed 1.5 mg, Fentanyl 75 mcg. Additional Medications: As antibiotic prophylaxis, Ancef was ordered pre-procedure and administered intravenously within one hour of incision. ANESTHESIA/SEDATION: Moderate sedation time: 20 minutes CONTRAST:  None PROCEDURE: The procedure, risks, benefits, and alternatives were explained to the patient. Questions regarding the procedure were encouraged and answered. The patient understands and consents to the procedure. The right neck was prepped with ChloraPrep in a sterile fashion, and a sterile drape was applied covering the operative field. A sterile gown and sterile gloves were used for the procedure. 1% lidocaine into the skin and subcutaneous tissue. The right jugular vein was noted to be patent initially with ultrasound. Under sonographic guidance, a micropuncture needle was inserted into the right IJ vein (Ultrasound and fluoroscopic image documentation was performed). It was removed over an 018 wire which was upsized to an Amplatz. This was advanced into the IVC. A small incision was made in the right upper chest. The tunneling device was utilized to advance the 23 centimeter tip to cuff catheter from the chest incision and out the neck incision. A peel-away sheath was advanced over the Amplatz wire. The leading edge of the catheter was then advanced through the peel-away sheath. The peel-away sheath was removed. It was flushed and instilled with heparin. The chest incision was closed with a 0 Prolene pursestring stitch. The neck incision was closed with a 4-0 Vicryl subcuticular stitch. COMPLICATIONS: None FINDINGS: The image demonstrates placement of a tunneled dialysis catheter with its tip in the right atrium. IMPRESSION: Successful right IJ vein tunneled dialysis catheter with its  tip in the right atrium. Electronically Signed   By: Marybelle Killings M.D.   On: 09/23/2015 12:33   Ir US Guide Vasc Access Right  09/23/2015  CLINICAL DATA:  Transverse myelitis.  Plasmapheresis. EXAM: TUNNELED DIALYSIS CATHETER PLACEMENT, ULTRASOUND GUIDANCE FOR VASCULAR ACCESS FLUOROSCOPY TIME:  24 seconds MEDICATIONS AND MEDICAL HISTORY: Versed 1.5 mg, Fentanyl 75 mcg. Additional Medications: As antibiotic prophylaxis, Ancef was ordered pre-procedure and administered intravenously within one hour of incision. ANESTHESIA/SEDATION: Moderate sedation time: 20 minutes CONTRAST:  None PROCEDURE: The procedure, risks, benefits, and alternatives were explained to the patient. Questions regarding the  procedure were encouraged and answered. The patient understands and consents to the procedure. The right neck was prepped with ChloraPrep in a sterile fashion, and a sterile drape was applied covering the operative field. A sterile gown and sterile gloves were used for the procedure. 1% lidocaine into the skin and subcutaneous tissue. The right jugular vein was noted to be patent initially with ultrasound. Under sonographic guidance, a micropuncture needle was inserted into the right IJ vein (Ultrasound and fluoroscopic image documentation was performed). It was removed over an 018 wire which was upsized to an Amplatz. This was advanced into the IVC. A small incision was made in the right upper chest. The tunneling device was utilized to advance the 23 centimeter tip to cuff catheter from the chest incision and out the neck incision. A peel-away sheath was advanced over the Amplatz wire. The leading edge of the catheter was then advanced through the peel-away sheath. The peel-away sheath was removed. It was flushed and instilled with heparin. The chest incision was closed with a 0 Prolene pursestring stitch. The neck incision was closed with a 4-0 Vicryl subcuticular stitch. COMPLICATIONS: None FINDINGS: The image  demonstrates placement of a tunneled dialysis catheter with its tip in the right atrium. IMPRESSION: Successful right IJ vein tunneled dialysis catheter with its tip in the right atrium. Electronically Signed   By: Marybelle Killings M.D.   On: 09/23/2015 12:33   Dg Fluoro Guide Lumbar Puncture  09/22/2015  CLINICAL DATA:  Transverse myelitis. EXAM: DIAGNOSTIC LUMBAR PUNCTURE UNDER FLUOROSCOPIC GUIDANCE FLUOROSCOPY TIME:  Fluoroscopy Time (in minutes and seconds): 0 minutes, 37 seconds Number of Acquired Images:  0 PROCEDURE: I discussed the risks (including hemorrhage, infection, headache, and nerve damage, among others), benefits, and alternatives to fluoroscopically guided lumbar puncture with the patient. We specifically discussed the high technical likelihood of success of the procedure. The patient understood and elected to undergo the procedure. Standard time-out was employed. Following sterile skin prep and local anesthetic administration consisting of 1 percent lidocaine, a 22 gauge spinal needle was advanced without difficulty into the thecal sac at the at the L4-5 level. Clear CSF was returned. Opening pressure was 17 cm of water. 13 cc of clear CSF was collected. The needle was subsequently removed and the skin cleansed and bandaged. No immediate complications were observed. IMPRESSION: 1. Technically successful fluoroscopically guided lumbar puncture at the L4-5 level. No complicating feature observed. Opening pressure 17 cm of water. Electronically Signed   By: Van Clines M.D.   On: 09/22/2015 09:15    CBC  Recent Labs Lab 10/01/15 0501 10/02/15 0350 10/03/15 0319 10/04/15 0616 10/05/15 0534  WBC 9.0 8.4 8.3 6.9 7.9  HGB 11.1* 10.3* 9.9* 10.2* 10.2*  HCT 33.8* 31.9* 30.8* 31.7* 32.5*  PLT 147* 149* 181 PLATELET CLUMPS NOTED ON SMEAR, COUNT APPEARS ADEQUATE 158  MCV 71.8* 71.7* 73.3* 72.5* 74.5*  MCH 23.6* 23.1* 23.6* 23.3* 23.4*  MCHC 32.8 32.3 32.1 32.2 31.4  RDW 14.3 14.3 15.2  15.3 15.7*  LYMPHSABS 2.5 1.7 1.1 1.2 1.5  MONOABS 0.7 0.6 0.5 0.5 0.6  EOSABS 0.1 0.1 0.0 0.0 0.0  BASOSABS 0.0 0.0 0.0 0.0 0.0    Chemistries   Recent Labs Lab 09/30/15 2241 10/05/15 0534  NA 143  --   K 3.3*  --   CL 101  --   GLUCOSE 92  --   BUN 12  --   CREATININE 0.80 0.72   ------------------------------------------------------------------------------------------------------------------ estimated creatinine clearance is 87.3  mL/min (by C-G formula based on Cr of 0.72). ------------------------------------------------------------------------------------------------------------------ No results for input(s): HGBA1C in the last 72 hours. ------------------------------------------------------------------------------------------------------------------ No results for input(s): CHOL, HDL, LDLCALC, TRIG, CHOLHDL, LDLDIRECT in the last 72 hours. ------------------------------------------------------------------------------------------------------------------ No results for input(s): TSH, T4TOTAL, T3FREE, THYROIDAB in the last 72 hours.  Invalid input(s): FREET3 ------------------------------------------------------------------------------------------------------------------ No results for input(s): VITAMINB12, FOLATE, FERRITIN, TIBC, IRON, RETICCTPCT in the last 72 hours.  Coagulation profile No results for input(s): INR, PROTIME in the last 168 hours.  No results for input(s): DDIMER in the last 72 hours.  Cardiac Enzymes  Recent Labs Lab 09/28/15 2105 09/29/15 0716 09/29/15 1425  TROPONINI 0.04* <0.03 <0.03   ------------------------------------------------------------------------------------------------------------------ Invalid input(s): POCBNP   Recent Labs  10/03/15 2116 10/04/15 0638 10/04/15 1122 10/04/15 1633 10/04/15 2118 10/05/15 0649  GLUCAP 121* 70 123* 174* 99 31     RAI,RIPUDEEP M.D. [facility] 10/05/2015, 11:46 AM  Pager:  [phone] Between 7am to 7pm - call Pager - [phone]  After 7pm go to [email or web address] - password [email or web address]  Call night coverage person covering after 7pm

## 2015-10-06 LAB — CBC WITH DIFFERENTIAL/PLATELET
Basophils Absolute: 0 10*3/uL (ref 0.0–0.1)
Basophils Relative: 0 %
EOS PCT: 0 %
Eosinophils Absolute: 0 10*3/uL (ref 0.0–0.7)
HEMATOCRIT: 32.3 % — AB (ref 36.0–46.0)
Hemoglobin: 10.2 g/dL — ABNORMAL LOW (ref 12.0–15.0)
LYMPHS ABS: 1.2 10*3/uL (ref 0.7–4.0)
LYMPHS PCT: 14 %
MCH: 23.5 pg — AB (ref 26.0–34.0)
MCHC: 31.6 g/dL (ref 30.0–36.0)
MCV: 74.4 fL — AB (ref 78.0–100.0)
MONO ABS: 0.5 10*3/uL (ref 0.1–1.0)
MONOS PCT: 6 %
NEUTROS ABS: 6.5 10*3/uL (ref 1.7–7.7)
Neutrophils Relative %: 80 %
Platelets: 209 10*3/uL (ref 150–400)
RBC: 4.34 MIL/uL (ref 3.87–5.11)
RDW: 15.4 % (ref 11.5–15.5)
WBC: 8.2 10*3/uL (ref 4.0–10.5)

## 2015-10-06 LAB — GLUCOSE, CAPILLARY
GLUCOSE-CAPILLARY: 87 mg/dL (ref 65–99)
Glucose-Capillary: 103 mg/dL — ABNORMAL HIGH (ref 65–99)
Glucose-Capillary: 75 mg/dL (ref 65–99)
Glucose-Capillary: 138 mg/dL — ABNORMAL HIGH (ref 65–99)

## 2015-10-06 MED ORDER — LORAZEPAM 0.5 MG PO TABS: 0.5000 mg | ORAL_TABLET | Freq: Three times a day (TID) | ORAL | Status: DC | PRN

## 2015-10-06 NOTE — Progress Notes (Signed)
[facility] here to pick patient up and transfer to the nursing home. Patient left with all belongings

## 2015-10-06 NOTE — Discharge Summary (Addendum)
Physician Discharge Summary  Andrea Berry [id number] DOB: 1966/08/19 DOA: 09/21/2015  PCP: [name], NP  Admit date: 09/21/2015 Discharge date: 10/06/2015  Time spent: 40 minutes  Recommendations for Outpatient Follow-up:  1. Follow-up with the nursing home M.D. 2. Follow-up with neurology as outpatient, for consideration of starting rituximab or cyclophosphamide. 3. Hemodialysis access tunneled catheter was not removed, in case patient needs plasma exchange in the near future.   Discharge Diagnoses:  Principal Problem:   Transverse myelitis ([place]) Active Problems:   GERD (gastroesophageal reflux disease)   SLE (systemic lupus erythematosus) (HCC)   Hypertension   Depression   History of CVA (cerebrovascular accident)   Leukocytosis   Thrombocytopenia ([place])   Discharge Condition: Stable  Diet recommendation: Heart healthy  Filed Weights   09/28/15 0600 09/28/15 1004 09/30/15 0500  Weight: 81 kg (178 lb 9.2 oz) 81 kg (178 lb 9.2 oz) 87.272 kg (192 lb 6.4 oz)    History of present illness:  Andrea Berry is a 50 y.o. female with a past medical history of CVA in 2008, legally blind, SLE, hypertension, GERD, urolithiasis, GERD, anxiety, depression who comes to the hospital via EMS due to left-sided weakness since yesterday and loss of appetite for 4 days.   Per patient, since yesterday afternoon she has noticed having weakness and numbness in her left arm. She states that the arm is painful. She states that she stood up to walk, was stumbling and it stays that since then she has also had left lower extremity weakness. Symptoms are associated with headache on the back of her head. She denies speech or language comprehension abnormalities, worsening of vision or double vision, dizziness or dyspnea. She denies chest pain, diaphoresis, pitting edema lower extremities, PND orthopnea. She has had palpitations and worsening of her anxiety since then.  She states that over the  past 4 days she has had decreased appetite, with epigastric discomfort associated with mild nausea. She denies fever, chills, emesis, diarrhea, constipation, melena or hematochezia. She states that she feels fatigue.  Hospital Course:   Severe left arm, leg weakness:Secondary to Devic's disease, improving somewhat - Per patient, continues to report no significant improvement with Solu-Medrol and the plasmapheresis, feels slightly better after first dose of IVIG - Neurology was consulted.   Initially she was placed on IV Solu-Medrol total of 5 doses, completed 09/26/2015 and plasma exchange every other day for a total 5 runs, finished on 09/30/2015 .  Then she was seen by neurology again on 1/20, at this time recommending IVIG, started 1/21, finished 5 days of IVIG.  -Seen by PT/OT and recommended skilled nursing facility. -Neurology recommended outpatient follow-up for probable initiation of Rituxan or cyclophosphamide. -Patient might need referral to tertiary center for further evaluation of her neurological issues.  Transient mild Hypotension with bradycardia, chest pain on 1/18 - Taper prednisone, slowly taper to 5 mg maintenance dose. - EKG showed anterolateral T-wave inversions, similar to prior EKG on 1/16 and 1/11.  - 2-D echo on 1/12 had shown EF of [id number], grade 1 diastolic dysfunction, this is resolved.  History of lupus. On 5 mg of prednisone at home -Patient was placed on IV Solu-Medrol, along with Imuran. Outpatient follow-up with Dr. Estanislado Pandy postdischarge. -Continue prednisone, tapered slowly to maintenance dose of 5 mg  History of TIA/stroke in the past. No acute issues.  Prolonged QTC upon admission. IV magnesium, low-dose beta blocker, monitor on telemetry. Resolved on repeat EKG   Dehydration with tachycardia. Hydrated,  and improved, stable echogram.  GERD. On PPI.  Leuokocytosis due to steroids. Afebrile monitor.  Low TSH. Could be sick  euthyroid  Steroid induced hyperglycemia -This is treated with SSI while she was in the hospital, expect glucose to normalize now she is back to 5 mg of prednisone.   Procedures:  Placement of right IJ tunneled hemodialysis catheter by Dr. Bartholome Bill of intervention radiology on 09/23/2015.  2-D echocardiogram done on 09/22/2015 Study Conclusions  - Left ventricle: The cavity size was normal. Wall thickness was increased in a pattern of moderate to severe LVH. Systolic function was vigorous. The estimated ejection fraction was in the range of 65% to 70%. There was dynamic obstruction in the mid cavity, with a peak velocity of 314 cm/sec and a peak gradient of 39 mm Hg. Wall motion was normal; there were no regional wall motion abnormalities. Doppler parameters are consistent with abnormal left ventricular relaxation (grade 1 diastolic dysfunction). - Left atrium: The atrium was mildly dilated. - Pericardium, extracardiac: A small pericardial effusion was identified circumferential to the heart.  Consultations:  Neurology  Discharge Exam: Filed Vitals:   10/06/15 0549 10/06/15 1020  BP: 128/72 133/77  Pulse: 112 119  Temp: 98.5 F (36.9 C) 99.1 F (37.3 C)  Resp: 18 17   General: Alert and awake, oriented x3, not in any acute distress. HEENT: anicteric sclera, pupils reactive to light and accommodation, EOMI CVS: S1-S2 clear, no murmur rubs or gallops Chest: clear to auscultation bilaterally, no wheezing, rales or rhonchi Abdomen: soft nontender, nondistended, normal bowel sounds, no organomegaly Extremities: no cyanosis, clubbing or edema noted bilaterally Neuro: Cranial nerves II-XII intact, no focal neurological deficits  Discharge Instructions   Discharge Instructions    Diet - low sodium heart healthy    Complete by:  As directed      Increase activity slowly    Complete by:  As directed           Current Discharge Medication List     CONTINUE these medications which have CHANGED   Details  LORazepam (ATIVAN) 0.5 MG tablet Take 1 tablet (0.5 mg total) by mouth every 8 (eight) hours as needed for anxiety. Qty: 10 tablet, Refills: 0      CONTINUE these medications which have NOT CHANGED   Details  azaTHIOprine (IMURAN) 50 MG tablet Take 50 mg by mouth daily.     ibandronate (BONIVA) 150 MG tablet Take 150 mg by mouth every 30 (thirty) days.     Multiple Vitamin (MULTIVITAMIN) capsule Take 1 capsule by mouth daily.    omeprazole (PRILOSEC) 20 MG capsule Take 20 mg by mouth daily.     predniSONE (DELTASONE) 5 MG tablet Take 5 mg by mouth daily with breakfast.    pregabalin (LYRICA) 50 MG capsule Take 50 mg by mouth 3 (three) times daily.       No Known Allergies    The results of significant diagnostics from this hospitalization (including imaging, microbiology, ancillary and laboratory) are listed below for reference.    Significant Diagnostic Studies: Dg Chest 2 View  09/22/2015  CLINICAL DATA:  TIA. EXAM: CHEST  2 VIEW COMPARISON:  04/29/2012 FINDINGS: Lung volumes are low. Cardiomegaly is unchanged allowing for differences in technique. Minimal bibasilar subsegmental atelectasis no pulmonary edema, consolidation, pleural effusion or pneumothorax. No acute osseous abnormalities are seen. IMPRESSION: Low lung volumes with stable cardiomegaly. Electronically Signed   By: Jeb Levering M.D.   On: 09/22/2015 03:36   Ct Head  Wo Contrast  09/21/2015  CLINICAL DATA:  Acute left-sided weakness. EXAM: CT HEAD WITHOUT CONTRAST TECHNIQUE: Contiguous axial images were obtained from the base of the skull through the vertex without intravenous contrast. COMPARISON:  CT scan of June 24, 2014. FINDINGS: Bony calvarium appears intact. No mass effect or midline shift is noted. Ventricular size is within normal limits. There is no evidence of mass lesion, hemorrhage or acute infarction. IMPRESSION: Normal head CT.  Electronically Signed   By: Marijo Conception, M.D.   On: 09/21/2015 16:27   Mr Brain Wo Contrast  09/21/2015  CLINICAL DATA:  Left arm numbness and pain. Neck pain. Stroke versus cervical spine disease. EXAM: MRI HEAD WITHOUT CONTRAST MRI CERVICAL SPINE WITHOUT CONTRAST TECHNIQUE: Multiplanar, multiecho pulse sequences of the brain and surrounding structures, and cervical spine, to include the craniocervical junction and cervicothoracic junction, were obtained without intravenous contrast. COMPARISON:  Head CT earlier same day.  MRI 06/24/2014 FINDINGS: MRI HEAD FINDINGS There is a 1 cm region of T2 and FLAIR abnormality within the left thalamus. No restricted diffusion presently. On the old MRI, there appear to be an old small vessel insult in this location. Elsewhere, the cerebral hemispheres show a few punctate foci of T2 and FLAIR signal in the deep white matter. No cortical or large vessel territory insult. No mass lesion, hemorrhage, hydrocephalus or extra-axial collection. No pituitary mass. No inflammatory sinus disease. No skull or skullbase lesion. MRI CERVICAL SPINE FINDINGS The cervical spinal cord is diffusely abnormal showing enlargement and inter medullary T2 signal. This extends from the obex all the way to the C7 level. This is consistent with acute deep mild is a shin or myelitis. There are no degenerative changes. There is no compressive pathology. IMPRESSION: 1 cm region of abnormal T2 and FLAIR signal within the left thalamus not consistent with acute infarction. This could be an acute focus of demyelination or conceivably but less likely a late subacute infarction. Diffusely abnormal cervical spinal cord which is swollen and shows diffuse abnormal T2 signal. This is likely to represent acute demyelination/neuromyelitis optica (Devic's disease) or acute transverse myelitis. Quite likely that the findings of the left thalamus in the spinal cord are due to the same pathology. Electronically  Signed   By: Nelson Chimes M.D.   On: 09/21/2015 20:41   Mr Jeri Cos Contrast  09/22/2015  CLINICAL DATA:  50 yo female with his history of bilateral optic neuritis now with longitudinally extensive transverse myelitis. Weakness of arms and legs. EXAM: MRI CERVICAL SPINE WITH CONTRAST; MRI HEAD WITH CONTRAST TECHNIQUE: Multiplanar and multiecho pulse sequences of the cervical spine, to include the craniocervical junction and cervicothoracic junction, were obtained according to standard protocol with intravenous contrast.; Multiplanar, multiecho pulse sequences of the brain and surrounding structures were obtained according to standard protocol with intravenous contrast CONTRAST:  63mL MULTIHANCE GADOBENATE DIMEGLUMINE 529 MG/ML IV SOLN COMPARISON:  Noncontrast brain and cervical spine from 07/21/2016. FINDINGS: Post infusion images of the brain demonstrates no abnormal enhancement. LEFT thalamic lesion, T2 and FLAIR hyperintense, probably represents an atypical area of demyelination, but non acute, given the lack of restricted diffusion and absence of blood brain barrier breakdown. In the cervical spine, there is extensive postcontrast enhancement, corresponding to the T2 and FLAIR hyperintensity on yesterday's exam, extending over multiple cervical segments, from the foramen magnum through C7. This enhancement involves both gray and white matter. There is greater involvement of the LEFT hemicord. In the setting of a patient with  history of optic neuritis, the findings are consistent with Devic's disease. No features specific for optic neuritis can be observed on post infusion imaging through the brain, non dedicated orbit study. IMPRESSION: No abnormal intracranial enhancement is documented. Heterogeneous enhancement of the enlarged cervical cord segments, from foramen magnum through C7, involving both gray and white matter, consistent with Devic's disease. Electronically Signed   By: Staci Righter M.D.   On:  09/22/2015 12:39   Mr Cervical Spine Wo Contrast  09/21/2015  CLINICAL DATA:  Left arm numbness and pain. Neck pain. Stroke versus cervical spine disease. EXAM: MRI HEAD WITHOUT CONTRAST MRI CERVICAL SPINE WITHOUT CONTRAST TECHNIQUE: Multiplanar, multiecho pulse sequences of the brain and surrounding structures, and cervical spine, to include the craniocervical junction and cervicothoracic junction, were obtained without intravenous contrast. COMPARISON:  Head CT earlier same day.  MRI 06/24/2014 FINDINGS: MRI HEAD FINDINGS There is a 1 cm region of T2 and FLAIR abnormality within the left thalamus. No restricted diffusion presently. On the old MRI, there appear to be an old small vessel insult in this location. Elsewhere, the cerebral hemispheres show a few punctate foci of T2 and FLAIR signal in the deep white matter. No cortical or large vessel territory insult. No mass lesion, hemorrhage, hydrocephalus or extra-axial collection. No pituitary mass. No inflammatory sinus disease. No skull or skullbase lesion. MRI CERVICAL SPINE FINDINGS The cervical spinal cord is diffusely abnormal showing enlargement and inter medullary T2 signal. This extends from the obex all the way to the C7 level. This is consistent with acute deep mild is a shin or myelitis. There are no degenerative changes. There is no compressive pathology. IMPRESSION: 1 cm region of abnormal T2 and FLAIR signal within the left thalamus not consistent with acute infarction. This could be an acute focus of demyelination or conceivably but less likely a late subacute infarction. Diffusely abnormal cervical spinal cord which is swollen and shows diffuse abnormal T2 signal. This is likely to represent acute demyelination/neuromyelitis optica (Devic's disease) or acute transverse myelitis. Quite likely that the findings of the left thalamus in the spinal cord are due to the same pathology. Electronically Signed   By: Nelson Chimes M.D.   On: 09/21/2015  20:41   Mr Cervical Spine W Contrast  09/22/2015  CLINICAL DATA:  50 yo female with his history of bilateral optic neuritis now with longitudinally extensive transverse myelitis. Weakness of arms and legs. EXAM: MRI CERVICAL SPINE WITH CONTRAST; MRI HEAD WITH CONTRAST TECHNIQUE: Multiplanar and multiecho pulse sequences of the cervical spine, to include the craniocervical junction and cervicothoracic junction, were obtained according to standard protocol with intravenous contrast.; Multiplanar, multiecho pulse sequences of the brain and surrounding structures were obtained according to standard protocol with intravenous contrast CONTRAST:  35mL MULTIHANCE GADOBENATE DIMEGLUMINE 529 MG/ML IV SOLN COMPARISON:  Noncontrast brain and cervical spine from 07/21/2016. FINDINGS: Post infusion images of the brain demonstrates no abnormal enhancement. LEFT thalamic lesion, T2 and FLAIR hyperintense, probably represents an atypical area of demyelination, but non acute, given the lack of restricted diffusion and absence of blood brain barrier breakdown. In the cervical spine, there is extensive postcontrast enhancement, corresponding to the T2 and FLAIR hyperintensity on yesterday's exam, extending over multiple cervical segments, from the foramen magnum through C7. This enhancement involves both gray and white matter. There is greater involvement of the LEFT hemicord. In the setting of a patient with history of optic neuritis, the findings are consistent with Devic's disease. No features specific  for optic neuritis can be observed on post infusion imaging through the brain, non dedicated orbit study. IMPRESSION: No abnormal intracranial enhancement is documented. Heterogeneous enhancement of the enlarged cervical cord segments, from foramen magnum through C7, involving both gray and white matter, consistent with Devic's disease. Electronically Signed   By: Staci Righter M.D.   On: 09/22/2015 12:39   Ir Fluoro Guide Cv  Line Right  09/23/2015  CLINICAL DATA:  Transverse myelitis.  Plasmapheresis. EXAM: TUNNELED DIALYSIS CATHETER PLACEMENT, ULTRASOUND GUIDANCE FOR VASCULAR ACCESS FLUOROSCOPY TIME:  24 seconds MEDICATIONS AND MEDICAL HISTORY: Versed 1.5 mg, Fentanyl 75 mcg. Additional Medications: As antibiotic prophylaxis, Ancef was ordered pre-procedure and administered intravenously within one hour of incision. ANESTHESIA/SEDATION: Moderate sedation time: 20 minutes CONTRAST:  None PROCEDURE: The procedure, risks, benefits, and alternatives were explained to the patient. Questions regarding the procedure were encouraged and answered. The patient understands and consents to the procedure. The right neck was prepped with ChloraPrep in a sterile fashion, and a sterile drape was applied covering the operative field. A sterile gown and sterile gloves were used for the procedure. 1% lidocaine into the skin and subcutaneous tissue. The right jugular vein was noted to be patent initially with ultrasound. Under sonographic guidance, a micropuncture needle was inserted into the right IJ vein (Ultrasound and fluoroscopic image documentation was performed). It was removed over an 018 wire which was upsized to an Amplatz. This was advanced into the IVC. A small incision was made in the right upper chest. The tunneling device was utilized to advance the 23 centimeter tip to cuff catheter from the chest incision and out the neck incision. A peel-away sheath was advanced over the Amplatz wire. The leading edge of the catheter was then advanced through the peel-away sheath. The peel-away sheath was removed. It was flushed and instilled with heparin. The chest incision was closed with a 0 Prolene pursestring stitch. The neck incision was closed with a 4-0 Vicryl subcuticular stitch. COMPLICATIONS: None FINDINGS: The image demonstrates placement of a tunneled dialysis catheter with its tip in the right atrium. IMPRESSION: Successful right IJ vein  tunneled dialysis catheter with its tip in the right atrium. Electronically Signed   By: Marybelle Killings M.D.   On: 09/23/2015 12:33   Ir US Guide Vasc Access Right  09/23/2015  CLINICAL DATA:  Transverse myelitis.  Plasmapheresis. EXAM: TUNNELED DIALYSIS CATHETER PLACEMENT, ULTRASOUND GUIDANCE FOR VASCULAR ACCESS FLUOROSCOPY TIME:  24 seconds MEDICATIONS AND MEDICAL HISTORY: Versed 1.5 mg, Fentanyl 75 mcg. Additional Medications: As antibiotic prophylaxis, Ancef was ordered pre-procedure and administered intravenously within one hour of incision. ANESTHESIA/SEDATION: Moderate sedation time: 20 minutes CONTRAST:  None PROCEDURE: The procedure, risks, benefits, and alternatives were explained to the patient. Questions regarding the procedure were encouraged and answered. The patient understands and consents to the procedure. The right neck was prepped with ChloraPrep in a sterile fashion, and a sterile drape was applied covering the operative field. A sterile gown and sterile gloves were used for the procedure. 1% lidocaine into the skin and subcutaneous tissue. The right jugular vein was noted to be patent initially with ultrasound. Under sonographic guidance, a micropuncture needle was inserted into the right IJ vein (Ultrasound and fluoroscopic image documentation was performed). It was removed over an 018 wire which was upsized to an Amplatz. This was advanced into the IVC. A small incision was made in the right upper chest. The tunneling device was utilized to advance the 23 centimeter tip to cuff catheter  from the chest incision and out the neck incision. A peel-away sheath was advanced over the Amplatz wire. The leading edge of the catheter was then advanced through the peel-away sheath. The peel-away sheath was removed. It was flushed and instilled with heparin. The chest incision was closed with a 0 Prolene pursestring stitch. The neck incision was closed with a 4-0 Vicryl subcuticular stitch. COMPLICATIONS:  None FINDINGS: The image demonstrates placement of a tunneled dialysis catheter with its tip in the right atrium. IMPRESSION: Successful right IJ vein tunneled dialysis catheter with its tip in the right atrium. Electronically Signed   By: Marybelle Killings M.D.   On: 09/23/2015 12:33   Dg Fluoro Guide Lumbar Puncture  09/22/2015  CLINICAL DATA:  Transverse myelitis. EXAM: DIAGNOSTIC LUMBAR PUNCTURE UNDER FLUOROSCOPIC GUIDANCE FLUOROSCOPY TIME:  Fluoroscopy Time (in minutes and seconds): 0 minutes, 37 seconds Number of Acquired Images:  0 PROCEDURE: I discussed the risks (including hemorrhage, infection, headache, and nerve damage, among others), benefits, and alternatives to fluoroscopically guided lumbar puncture with the patient. We specifically discussed the high technical likelihood of success of the procedure. The patient understood and elected to undergo the procedure. Standard time-out was employed. Following sterile skin prep and local anesthetic administration consisting of 1 percent lidocaine, a 22 gauge spinal needle was advanced without difficulty into the thecal sac at the at the L4-5 level. Clear CSF was returned. Opening pressure was 17 cm of water. 13 cc of clear CSF was collected. The needle was subsequently removed and the skin cleansed and bandaged. No immediate complications were observed. IMPRESSION: 1. Technically successful fluoroscopically guided lumbar puncture at the L4-5 level. No complicating feature observed. Opening pressure 17 cm of water. Electronically Signed   By: Van Clines M.D.   On: 09/22/2015 09:15    Microbiology: No results found for this or any previous visit (from the past 240 hour(s)).   Labs: Basic Metabolic Panel:  Recent Labs Lab 09/30/15 2241 10/05/15 0534  NA 143  --   K 3.3*  --   CL 101  --   GLUCOSE 92  --   BUN 12  --   CREATININE 0.80 0.72   Liver Function Tests: No results for input(s): AST, ALT, ALKPHOS, BILITOT, PROT, ALBUMIN in the  last 168 hours. No results for input(s): LIPASE, AMYLASE in the last 168 hours. No results for input(s): AMMONIA in the last 168 hours. CBC:  Recent Labs Lab 10/02/15 0350 10/03/15 0319 10/04/15 0616 10/05/15 0534 10/06/15 0612  WBC 8.4 8.3 6.9 7.9 8.2  NEUTROABS 6.0 6.7 5.2 5.8 6.5  HGB 10.3* 9.9* 10.2* 10.2* 10.2*  HCT 31.9* 30.8* 31.7* 32.5* 32.3*  MCV 71.7* 73.3* 72.5* 74.5* 74.4*  PLT 149* 181 PLATELET CLUMPS NOTED ON SMEAR, COUNT APPEARS ADEQUATE 158 209   Cardiac Enzymes:  Recent Labs Lab 09/29/15 1425  TROPONINI <0.03   BNP: BNP (last 3 results) No results for input(s): BNP in the last 8760 hours.  ProBNP (last 3 results) No results for input(s): PROBNP in the last 8760 hours.  CBG:  Recent Labs Lab 10/05/15 1133 10/05/15 1739 10/05/15 2203 10/06/15 0635 10/06/15 1146  GLUCAP 107* 116* 103* 75 87       Signed:  Parsa Rickett A MD.  [facility] 10/06/2015, 1:42 PM

## 2015-10-06 NOTE — Clinical Social Work Note (Signed)
Clinical Social Worker facilitated patient discharge including contacting patient family and facility to confirm patient discharge plans.  Clinical information faxed to facility and family agreeable with plan.  CSW arranged ambulance transport via [facility] to [facility].  RN to call report prior to discharge.  Clinical Social Worker will sign off for now as social work intervention is no longer needed. Please consult us again if new need arises.  Jesse Laynee Lockamy, LCSW [phone] 

## 2015-10-06 NOTE — Progress Notes (Signed)
Patient is being d/c to a nursing facility. Report called to the receiving nurse. MD said to leave the dialysis port in place.

## 2015-10-06 NOTE — Care Management Important Message (Signed)
Important Message  Patient Details  Name: Andrea Berry MRN: [id number] Date of Birth: [birth year]   Medicare Important Message Given:  Yes    Zavia Pullen P Cindy Fullman 10/06/2015, 2:31 PM

## 2015-10-06 NOTE — Clinical Social Work Placement (Signed)
   CLINICAL SOCIAL WORK PLACEMENT  NOTE  Date:  10/06/2015  Patient Details  Name: Andrea Berry MRN: [id number] Date of Birth: [birth year]  Clinical Social Work is seeking post-discharge placement for this patient at the [place] level of care (*CSW will initial, date and re-position this form in  chart as items are completed):  Yes   Patient/family provided with Industry Work Department's list of facilities offering this level of care within the geographic area requested by the patient (or if unable, by the patient's family).  Yes   Patient/family informed of their freedom to choose among providers that offer the needed level of care, that participate in Medicare, Medicaid or managed care program needed by the patient, have an available bed and are willing to accept the patient.  Yes   Patient/family informed of [facility]'s ownership interest in [facility] and [facility], as well as of the fact that they are under no obligation to receive care at these facilities.  PASRR submitted to EDS on 09/29/15     PASRR number received on 09/29/15     Existing PASRR number confirmed on       FL2 transmitted to all facilities in geographic area requested by pt/family on 10/01/15     FL2 transmitted to all facilities within larger geographic area on       Patient informed that his/her managed care company has contracts with or will negotiate with certain facilities, including the following:        Yes   Patient/family informed of bed offers received.  Patient chooses bed at [facility]     Physician recommends and patient chooses bed at      Patient to be transferred to [facility] on 10/06/15.  Patient to be transferred to facility by Ambulance     Patient family notified on 10/06/15 of transfer.  Name of family member notified:  [name] (patient son) over the phone     PHYSICIAN       Additional Comment:    Bar[place], [place]

## 2015-10-25 ENCOUNTER — Ambulatory Visit: Payer: Medicare Other | Admitting: Occupational Therapy

## 2015-10-27 ENCOUNTER — Inpatient Hospital Stay (HOSPITAL_COMMUNITY)
Admission: EM | Admit: 2015-10-27 | Discharge: 2015-11-02 | DRG: 871 | Disposition: A | Discharge status: Skilled Nursing Facility | Payer: Medicare Other | Attending: Internal Medicine | Admitting: Internal Medicine

## 2015-10-27 ENCOUNTER — Emergency Department (HOSPITAL_COMMUNITY): Payer: Medicare Other

## 2015-10-27 ENCOUNTER — Inpatient Hospital Stay (HOSPITAL_COMMUNITY): Payer: Medicare Other

## 2015-10-27 ENCOUNTER — Encounter (HOSPITAL_COMMUNITY): Payer: Self-pay

## 2015-10-27 DIAGNOSIS — F329 Major depressive disorder, single episode, unspecified: Secondary | ICD-10-CM | POA: Diagnosis present

## 2015-10-27 DIAGNOSIS — R1013 Epigastric pain: Secondary | ICD-10-CM | POA: Diagnosis not present

## 2015-10-27 DIAGNOSIS — A419 Sepsis, unspecified organism: Principal | ICD-10-CM | POA: Diagnosis present

## 2015-10-27 DIAGNOSIS — R109 Unspecified abdominal pain: Secondary | ICD-10-CM

## 2015-10-27 DIAGNOSIS — D538 Other specified nutritional anemias: Secondary | ICD-10-CM | POA: Diagnosis not present

## 2015-10-27 DIAGNOSIS — I10 Essential (primary) hypertension: Secondary | ICD-10-CM | POA: Diagnosis not present

## 2015-10-27 DIAGNOSIS — D638 Anemia in other chronic diseases classified elsewhere: Secondary | ICD-10-CM | POA: Diagnosis present

## 2015-10-27 DIAGNOSIS — G360 Neuromyelitis optica [Devic]: Secondary | ICD-10-CM | POA: Diagnosis not present

## 2015-10-27 DIAGNOSIS — K219 Gastro-esophageal reflux disease without esophagitis: Secondary | ICD-10-CM | POA: Diagnosis present

## 2015-10-27 DIAGNOSIS — Z8673 Personal history of transient ischemic attack (TIA), and cerebral infarction without residual deficits: Secondary | ICD-10-CM | POA: Diagnosis not present

## 2015-10-27 DIAGNOSIS — H540 Blindness, both eyes: Secondary | ICD-10-CM | POA: Diagnosis present

## 2015-10-27 DIAGNOSIS — Y95 Nosocomial condition: Secondary | ICD-10-CM | POA: Diagnosis present

## 2015-10-27 DIAGNOSIS — M329 Systemic lupus erythematosus, unspecified: Secondary | ICD-10-CM | POA: Diagnosis present

## 2015-10-27 DIAGNOSIS — D7589 Other specified diseases of blood and blood-forming organs: Secondary | ICD-10-CM | POA: Diagnosis present

## 2015-10-27 DIAGNOSIS — I2699 Other pulmonary embolism without acute cor pulmonale: Secondary | ICD-10-CM

## 2015-10-27 DIAGNOSIS — R072 Precordial pain: Secondary | ICD-10-CM | POA: Diagnosis not present

## 2015-10-27 DIAGNOSIS — R079 Chest pain, unspecified: Secondary | ICD-10-CM

## 2015-10-27 DIAGNOSIS — I82C19 Acute embolism and thrombosis of unspecified internal jugular vein: Secondary | ICD-10-CM | POA: Diagnosis present

## 2015-10-27 DIAGNOSIS — R1084 Generalized abdominal pain: Secondary | ICD-10-CM

## 2015-10-27 DIAGNOSIS — J189 Pneumonia, unspecified organism: Secondary | ICD-10-CM | POA: Diagnosis present

## 2015-10-27 DIAGNOSIS — R000 Tachycardia, unspecified: Secondary | ICD-10-CM

## 2015-10-27 LAB — CBC
HCT: 26.5 % — ABNORMAL LOW (ref 36.0–46.0)
HCT: 32.6 % — ABNORMAL LOW (ref 36.0–46.0)
HEMOGLOBIN: 9.8 g/dL — AB (ref 12.0–15.0)
Hemoglobin: 8 g/dL — ABNORMAL LOW (ref 12.0–15.0)
MCH: 22.9 pg — ABNORMAL LOW (ref 26.0–34.0)
MCH: 22.9 pg — AB (ref 26.0–34.0)
MCHC: 30.2 g/dL (ref 30.0–36.0)
MCHC: 30.1 g/dL (ref 30.0–36.0)
MCV: 75.9 fL — ABNORMAL LOW (ref 78.0–100.0)
MCV: 76.2 fL — ABNORMAL LOW (ref 78.0–100.0)
Platelets: 299 10*3/uL (ref 150–400)
Platelets: 224 10*3/uL (ref 150–400)
RBC: 3.49 MIL/uL — ABNORMAL LOW (ref 3.87–5.11)
RBC: 4.28 MIL/uL (ref 3.87–5.11)
RDW: 15.4 % (ref 11.5–15.5)
RDW: 15.7 % — ABNORMAL HIGH (ref 11.5–15.5)
WBC: 13.7 10*3/uL — ABNORMAL HIGH (ref 4.0–10.5)
WBC: 12.9 10*3/uL — AB (ref 4.0–10.5)

## 2015-10-27 LAB — COMPREHENSIVE METABOLIC PANEL
ALT: 38 U/L (ref 14–54)
AST: 48 U/L — ABNORMAL HIGH (ref 15–41)
Albumin: 3.4 g/dL — ABNORMAL LOW (ref 3.5–5.0)
Alkaline Phosphatase: 63 U/L (ref 38–126)
Anion gap: 9 (ref 5–15)
BUN: 12 mg/dL (ref 6–20)
CO2: 21 mmol/L — ABNORMAL LOW (ref 22–32)
Calcium: 9.3 mg/dL (ref 8.9–10.3)
Chloride: 102 mmol/L (ref 101–111)
Creatinine, Ser: 0.87 mg/dL (ref 0.44–1.00)
GFR calc Af Amer: >60 mL/min (ref >60)
GFR calc non Af Amer: >60 mL/min (ref >60)
Glucose, Bld: 124 mg/dL — ABNORMAL HIGH (ref 65–99)
Potassium: 4.3 mmol/L (ref 3.5–5.1)
Sodium: 132 mmol/L — ABNORMAL LOW (ref 135–145)
Total Bilirubin: 1.2 mg/dL (ref 0.3–1.2)
Total Protein: 8.2 g/dL — ABNORMAL HIGH (ref 6.5–8.1)

## 2015-10-27 LAB — URINALYSIS, ROUTINE W REFLEX MICROSCOPIC
Bilirubin Urine: NEGATIVE
Glucose, UA: NEGATIVE mg/dL
Hgb urine dipstick: NEGATIVE
Ketones, ur: NEGATIVE mg/dL
Leukocytes, UA: NEGATIVE
Nitrite: NEGATIVE
Protein, ur: NEGATIVE mg/dL
Specific Gravity, Urine: 1.009 (ref 1.005–1.030)
pH: 6 (ref 5.0–8.0)

## 2015-10-27 LAB — PROTIME-INR
INR: 1.31 (ref 0.00–1.49)
Prothrombin Time: 16.4 seconds — ABNORMAL HIGH (ref 11.6–15.2)

## 2015-10-27 LAB — POC OCCULT BLOOD, ED: Fecal Occult Bld: NEGATIVE

## 2015-10-27 LAB — TYPE AND SCREEN
ABO/RH(D): A POS
ANTIBODY SCREEN: NEGATIVE

## 2015-10-27 LAB — LIPASE, BLOOD: Lipase: 23 U/L (ref 11–51)

## 2015-10-27 LAB — LACTIC ACID, PLASMA
LACTIC ACID, VENOUS: 1.2 mmol/L (ref 0.5–2.0)
Lactic Acid, Venous: 1 mmol/L (ref 0.5–2.0)

## 2015-10-27 LAB — PROCALCITONIN: PROCALCITONIN: 1.47 ng/mL

## 2015-10-27 LAB — TROPONIN I: TROPONIN I: 0.03 ng/mL (ref <0.031)

## 2015-10-27 LAB — APTT: APTT: 36 s (ref 24–37)

## 2015-10-27 LAB — HEPARIN LEVEL (UNFRACTIONATED): HEPARIN UNFRACTIONATED: 0.51 [IU]/mL (ref 0.30–0.70)

## 2015-10-27 LAB — ABO/RH: ABO/RH(D): A POS

## 2015-10-27 MED ORDER — ACETAMINOPHEN 325 MG PO TABS
650.0000 mg | ORAL_TABLET | Freq: Four times a day (QID) | ORAL | Status: DC | PRN
  Filled 2015-10-27: qty 2

## 2015-10-27 MED ORDER — AZATHIOPRINE 50 MG PO TABS
50.0000 mg | ORAL_TABLET | Freq: Every day | ORAL | Status: DC
  Administered 2015-10-27 – 2015-11-02 (×7): 50 mg via ORAL
  Filled 2015-10-27 (×7): qty 1

## 2015-10-27 MED ORDER — POLYETHYLENE GLYCOL 3350 17 G PO PACK
17.0000 g | PACK | Freq: Every day | ORAL | Status: DC | PRN
  Filled 2015-10-27: qty 1

## 2015-10-27 MED ORDER — SODIUM CHLORIDE 0.9 % IV SOLN
INTRAVENOUS | Status: DC
  Administered 2015-10-28: 05:00:00 via INTRAVENOUS

## 2015-10-27 MED ORDER — SODIUM CHLORIDE 0.9 % IV SOLN: Freq: Once | INTRAVENOUS | Status: DC

## 2015-10-27 MED ORDER — SODIUM CHLORIDE 0.9 % IV BOLUS (SEPSIS)
1000.0000 mL | INTRAVENOUS | Status: AC
  Administered 2015-10-27 (×2): 1000 mL via INTRAVENOUS

## 2015-10-27 MED ORDER — IOHEXOL 300 MG/ML  SOLN
100.0000 mL | Freq: Once | INTRAMUSCULAR | Status: AC | PRN
  Administered 2015-10-27: 100 mL via INTRAVENOUS

## 2015-10-27 MED ORDER — DOCUSATE SODIUM 100 MG PO CAPS
100.0000 mg | ORAL_CAPSULE | Freq: Two times a day (BID) | ORAL | Status: DC
  Administered 2015-10-27 – 2015-11-01 (×10): 100 mg via ORAL
  Filled 2015-10-27 (×17): qty 1

## 2015-10-27 MED ORDER — HEPARIN BOLUS VIA INFUSION
2000.0000 [IU] | Freq: Once | INTRAVENOUS | Status: AC
Start: 2015-10-27 — End: 2015-10-27
  Administered 2015-10-27: 2000 [IU] via INTRAVENOUS
  Filled 2015-10-27: qty 2000

## 2015-10-27 MED ORDER — HEPARIN (PORCINE) IN NACL 100-0.45 UNIT/ML-% IJ SOLN
850.0000 [IU]/h | INTRAMUSCULAR | Status: DC
  Administered 2015-10-28 – 2015-10-29 (×2): 1200 [IU]/h via INTRAVENOUS
  Filled 2015-10-27 (×3): qty 250

## 2015-10-27 MED ORDER — ONDANSETRON HCL 4 MG/2ML IJ SOLN
4.0000 mg | Freq: Four times a day (QID) | INTRAMUSCULAR | Status: DC | PRN
  Administered 2015-10-28 – 2015-10-31 (×5): 4 mg via INTRAVENOUS
  Filled 2015-10-27 (×5): qty 2

## 2015-10-27 MED ORDER — ONDANSETRON HCL 4 MG PO TABS
4.0000 mg | ORAL_TABLET | Freq: Four times a day (QID) | ORAL | Status: DC | PRN
  Administered 2015-10-31: 4 mg via ORAL
  Filled 2015-10-27: qty 1

## 2015-10-27 MED ORDER — VANCOMYCIN HCL IN DEXTROSE 1-5 GM/200ML-% IV SOLN
1000.0000 mg | Freq: Once | INTRAVENOUS | Status: AC
  Administered 2015-10-27: 1000 mg via INTRAVENOUS
  Filled 2015-10-27: qty 200

## 2015-10-27 MED ORDER — SODIUM CHLORIDE 0.9 % IV BOLUS (SEPSIS)
500.0000 mL | INTRAVENOUS | Status: AC
  Administered 2015-10-27: 500 mL via INTRAVENOUS

## 2015-10-27 MED ORDER — VANCOMYCIN HCL IN DEXTROSE 1-5 GM/200ML-% IV SOLN: 1000.0000 mg | Freq: Once | INTRAVENOUS | Status: DC

## 2015-10-27 MED ORDER — DEXTROSE 5 % IV SOLN
1.0000 g | Freq: Three times a day (TID) | INTRAVENOUS | Status: DC
  Administered 2015-10-27 – 2015-11-02 (×17): 1 g via INTRAVENOUS
  Filled 2015-10-27 (×18): qty 1

## 2015-10-27 MED ORDER — MORPHINE SULFATE (PF) 4 MG/ML IV SOLN
4.0000 mg | Freq: Once | INTRAVENOUS | Status: AC
  Administered 2015-10-27: 4 mg via INTRAVENOUS
  Filled 2015-10-27: qty 1

## 2015-10-27 MED ORDER — BISACODYL 5 MG PO TBEC
5.0000 mg | DELAYED_RELEASE_TABLET | Freq: Every day | ORAL | Status: DC | PRN
  Filled 2015-10-27: qty 1

## 2015-10-27 MED ORDER — VANCOMYCIN HCL IN DEXTROSE 1-5 GM/200ML-% IV SOLN
1000.0000 mg | Freq: Two times a day (BID) | INTRAVENOUS | Status: DC
  Administered 2015-10-27 – 2015-10-29 (×4): 1000 mg via INTRAVENOUS
  Filled 2015-10-27 (×7): qty 200

## 2015-10-27 MED ORDER — HYDROCODONE-ACETAMINOPHEN 5-325 MG PO TABS
1.0000 | ORAL_TABLET | ORAL | Status: DC | PRN
  Administered 2015-10-27 – 2015-10-31 (×5): 2 via ORAL
  Filled 2015-10-27 (×5): qty 2

## 2015-10-27 MED ORDER — PREGABALIN 50 MG PO CAPS
50.0000 mg | ORAL_CAPSULE | Freq: Three times a day (TID) | ORAL | Status: DC
  Administered 2015-10-27 – 2015-11-02 (×18): 50 mg via ORAL
  Filled 2015-10-27 (×18): qty 1

## 2015-10-27 MED ORDER — SODIUM CHLORIDE 0.9 % IV BOLUS (SEPSIS)
1000.0000 mL | Freq: Once | INTRAVENOUS | Status: AC
  Administered 2015-10-27: 1000 mL via INTRAVENOUS

## 2015-10-27 MED ORDER — CEFEPIME HCL 2 G IJ SOLR: 2.0000 g | Freq: Once | INTRAMUSCULAR | Status: DC

## 2015-10-27 MED ORDER — IOHEXOL 300 MG/ML  SOLN
50.0000 mL | Freq: Once | INTRAMUSCULAR | Status: DC | PRN
  Administered 2015-10-27: 50 mL via ORAL
  Filled 2015-10-27: qty 50

## 2015-10-27 MED ORDER — LORAZEPAM 0.5 MG PO TABS
0.5000 mg | ORAL_TABLET | Freq: Four times a day (QID) | ORAL | Status: DC | PRN
  Administered 2015-10-27 – 2015-10-28 (×3): 0.5 mg via ORAL
  Filled 2015-10-27 (×4): qty 1

## 2015-10-27 MED ORDER — DEXTROSE 5 % IV SOLN
2.0000 g | Freq: Once | INTRAVENOUS | Status: AC
  Administered 2015-10-27: 2 g via INTRAVENOUS
  Filled 2015-10-27: qty 2

## 2015-10-27 MED ORDER — ACETAMINOPHEN 650 MG RE SUPP
650.0000 mg | Freq: Four times a day (QID) | RECTAL | Status: DC | PRN
  Administered 2015-10-28: 650 mg via RECTAL
  Filled 2015-10-27: qty 1

## 2015-10-27 MED ORDER — SODIUM CHLORIDE 0.9% FLUSH
3.0000 mL | Freq: Two times a day (BID) | INTRAVENOUS | Status: DC
  Administered 2015-10-28 – 2015-11-01 (×5): 3 mL via INTRAVENOUS

## 2015-10-27 MED ORDER — MORPHINE SULFATE (PF) 2 MG/ML IV SOLN
2.0000 mg | INTRAVENOUS | Status: DC | PRN
  Administered 2015-10-27 – 2015-10-29 (×7): 2 mg via INTRAVENOUS
  Filled 2015-10-27 (×8): qty 1

## 2015-10-27 MED ORDER — PREDNISONE 5 MG PO TABS
5.0000 mg | ORAL_TABLET | Freq: Every day | ORAL | Status: DC
  Administered 2015-10-27 – 2015-11-02 (×7): 5 mg via ORAL
  Filled 2015-10-27 (×7): qty 1

## 2015-10-27 MED ORDER — ONDANSETRON HCL 4 MG/2ML IJ SOLN
4.0000 mg | Freq: Once | INTRAMUSCULAR | Status: DC | PRN
  Filled 2015-10-27: qty 2

## 2015-10-27 MED ORDER — ACETAMINOPHEN 325 MG PO TABS
650.0000 mg | ORAL_TABLET | Freq: Once | ORAL | Status: AC | PRN
  Administered 2015-10-27: 650 mg via ORAL
  Filled 2015-10-27: qty 2

## 2015-10-27 MED ORDER — PANTOPRAZOLE SODIUM 40 MG PO TBEC
40.0000 mg | DELAYED_RELEASE_TABLET | Freq: Every day | ORAL | Status: DC
  Administered 2015-10-27 – 2015-11-02 (×7): 40 mg via ORAL
  Filled 2015-10-27 (×9): qty 1

## 2015-10-27 NOTE — Progress Notes (Signed)
ANTICOAGULATION CONSULT NOTE - Initial Consult  Pharmacy Consult for Heparin Indication: pulmonary embolus  No Known Allergies  Patient Measurements: Height: 5\' 1"  (154.9 cm) Weight: 156 lb (70.761 kg) (patient weighed on Friday) IBW/kg (Calculated) : 47.8 Heparin Dosing Weight: 63 kg  Vital Signs: Temp: 99.5 F (37.5 C) (02/16 1025) Temp Source: Oral (02/16 1025) BP: 135/74 mmHg (02/16 1025) Pulse Rate: 115 (02/16 1025)  Labs:  Recent Labs  10/27/15 0613 10/27/15 0918  HGB 8.0*  --   HCT 26.5*  --   PLT 299  --   LABPROT  --  16.4*  INR  --  1.31  CREATININE 0.87  --     Estimated Creatinine Clearance: 71.9 mL/min (by C-G formula based on Cr of 0.87).   Medical History: Past Medical History  Diagnosis Date  . Hypertension   . Blind   . Lupus ([place])   . Kidney stones   . Shortness of breath     due to medication  . Depression   . GERD (gastroesophageal reflux disease)   . Headache(784.0)   . Arthritis   . Stroke [facility]) 2008    visually impaired     Assessment: 10 yoF presents with abdominal pain. CT found acute right lower lobe pulmonary embolus with associated right lower lobe atelectasis/ pulmonary infarct.  Pharmacy consulted to start heparin infusion.  Baseline anticoagulation labs: INR 1.31, aPTT ordered STAT CBC: Hgb 8.0, Plts 299K  Goal of Therapy:  Heparin level 0.3-0.7 units/ml Monitor platelets by anticoagulation protocol: Yes   Plan:  1.  Heparin 2000 unit bolus x 1 then start infusion at 1200 units/hr per [place] [facility]. 2.  Check heparin level in 6 hours. 3.  Daily heparin level and CBC while on heparin infusion.  Hershal Coria 10/27/2015,11:01 AM

## 2015-10-27 NOTE — ED Provider Notes (Signed)
CSN: [id number]     Arrival date & time 10/27/15  0532 History   First MD Initiated Contact with Patient 10/27/15 [phone]     Chief Complaint  Patient presents with  . Fever  . Abdominal Pain    Andrea Berry is a 50 y.o. female who presents to the emergency department complaining of bilateral lower abdominal pain and fever since yesterday. She reports gradual worsening of her abdominal pain since yesterday around 6 PM. She reports nausea without vomiting. Patient reports she has not had a good bowel movement in the past week. She reports receiving an enema at her skilled nursing facility and had a small bowel movement prior to arrival today. She currently complains of 10 out of 10 bilateral lower abdominal pain with some slight upper abdominal pain. She denies diarrhea or hematochezia. She denies previous abdominal surgeries. She has taken nothing for treatment of her pain prior to arrival today. Patient reports she passed a small amount of gas earlier today. Patient denies vomiting, diarrhea, chest pain, coughing, urinary symptoms or rashes.   Patient is a 50 y.o. female presenting with fever and abdominal pain. The history is provided by the patient. No language interpreter was used.  Fever Associated symptoms: nausea   Associated symptoms: no chest pain, no congestion, no cough, no diarrhea, no dysuria, no headaches, no rash, no sore throat and no vomiting   Abdominal Pain Associated symptoms: fever and nausea   Associated symptoms: no chest pain, no cough, no diarrhea, no dysuria, no hematuria, no shortness of breath, no sore throat, no vaginal bleeding, no vaginal discharge and no vomiting     Past Medical History  Diagnosis Date  . Hypertension   . Blind   . Lupus ([place])   . Kidney stones   . Shortness of breath     due to medication  . Depression   . GERD (gastroesophageal reflux disease)   . Headache(784.0)   . Arthritis   . Stroke [facility]) 2008    visually impaired   Past  Surgical History  Procedure Laterality Date  . Tubal ligation    . Tubal ligation    . Breast biopsy     Family History  Problem Relation Age of Onset  . Cancer Mother   . Cancer Father    Social History  Substance Use Topics  . Smoking status: Never Smoker   . Smokeless tobacco: None  . Alcohol Use: No   OB History    Gravida Para Term Preterm AB TAB SAB Ectopic Multiple Living   3 3 3       3      Review of Systems  Constitutional: Positive for fever and appetite change.  HENT: Negative for congestion and sore throat.   Eyes: Negative for visual disturbance.  Respiratory: Negative for cough, shortness of breath and wheezing.   Cardiovascular: Negative for chest pain and palpitations.  Gastrointestinal: Positive for nausea and abdominal pain. Negative for vomiting, diarrhea and blood in stool.  Genitourinary: Negative for dysuria, urgency, frequency, hematuria, decreased urine volume, vaginal bleeding, vaginal discharge and difficulty urinating.  Musculoskeletal: Negative for back pain and neck pain.  Skin: Negative for rash.  Neurological: Negative for headaches.      Allergies  Review of patient's allergies indicates no known allergies.  Home Medications   Prior to Admission medications   Medication Sig Start Date End Date Taking? Authorizing Provider  azaTHIOprine (IMURAN) 50 MG tablet Take 50 mg by mouth daily.  02/10/13  Yes Historical Provider, MD  ibandronate (BONIVA) 150 MG tablet Take 150 mg by mouth every 30 (thirty) days.  02/27/13  Yes Historical Provider, MD  Multiple Vitamin (MULTIVITAMIN) capsule Take 1 capsule by mouth daily.   Yes Historical Provider, MD  omeprazole (PRILOSEC) 20 MG capsule Take 20 mg by mouth daily.  01/18/13  Yes Historical Provider, MD  predniSONE (DELTASONE) 5 MG tablet Take 5 mg by mouth daily with breakfast.   Yes Historical Provider, MD  pregabalin (LYRICA) 50 MG capsule Take 50 mg by mouth 3 (three) times daily.   Yes Historical  Provider, MD  LORazepam (ATIVAN) 0.5 MG tablet Take 1 tablet (0.5 mg total) by mouth every 8 (eight) hours as needed for anxiety. Patient not taking: Reported on 10/27/2015 10/06/15   Verlee Monte, MD   BP 111/57 mmHg  Pulse 121  Temp(Src) 101.7 F (38.7 C) (Rectal)  Resp 17  SpO2 93% Physical Exam  Constitutional: She is oriented to person, place, and time. She appears well-developed and well-nourished. No distress.  HENT:  Head: Normocephalic and atraumatic.  Mouth/Throat: Oropharynx is clear and moist.  Eyes: Conjunctivae are normal. Pupils are equal, round, and reactive to light. Right eye exhibits no discharge. Left eye exhibits no discharge.  Neck: Normal range of motion. Neck supple. No JVD present. No tracheal deviation present.  Cardiovascular: Normal rate, regular rhythm, normal heart sounds and intact distal pulses.  Exam reveals no gallop and no friction rub.   No murmur heard. Patient is tachycardic with a heart rate of 112.  Pulmonary/Chest: Effort normal and breath sounds normal. No respiratory distress. She has no wheezes. She has no rales.  Diminished lung sounds bilaterally. No increased work of breathing. No rales or rhonchi noted. Central line catheter in right chest. No signs of infection.   Abdominal: Soft. Bowel sounds are normal. She exhibits no distension and no mass. There is tenderness. There is no rebound and no guarding.  Patient's abdomen is soft. Bowel sounds are present. Patient has mild generalized lower abdominal tenderness to palpation. No peritoneal signs.  Genitourinary: Guaiac negative stool.  Digital rectal exam performed by me with female PA-C chaperone. Patient has external hemorrhoids present. No gross bloody stool. Anal sphincter tone is normal. Guaiac negative.  Musculoskeletal: She exhibits no edema.  Lymphadenopathy:    She has no cervical adenopathy.  Neurological: She is alert and oriented to person, place, and time.  The patient is alert  and oriented 3. Patient has chronic left-sided deficits.  Skin: Skin is warm and dry. No rash noted. She is not diaphoretic. No erythema. No pallor.  Psychiatric: She has a normal mood and affect. Her behavior is normal.  Nursing note and vitals reviewed.   ED Course  Procedures (including critical care time) Labs Review Labs Reviewed  CBC - Abnormal; Notable for the following:    WBC 13.7 (*)    RBC 3.49 (*)    Hemoglobin 8.0 (*)    HCT 26.5 (*)    MCV 75.9 (*)    MCH 22.9 (*)    All other components within normal limits  CULTURE, BLOOD (ROUTINE X 2)  CULTURE, BLOOD (ROUTINE X 2)  LIPASE, BLOOD  COMPREHENSIVE METABOLIC PANEL  URINALYSIS, ROUTINE W REFLEX MICROSCOPIC (NOT AT [facility])  LACTIC ACID, PLASMA  LACTIC ACID, PLASMA  POC OCCULT BLOOD, ED    Imaging Review Dg Chest 2 View  10/27/2015  CLINICAL DATA:  Shortness of breath and fever. EXAM: CHEST  2 VIEW COMPARISON:  September 22, 2015 FINDINGS: There is a double lumen right central line terminating within the right atrium, unchanged since September 23, 2015. No pneumothorax. The cardiomediastinal silhouette is stable. There is a new small right-sided pleural effusion with underlying opacity, possibly atelectasis. Recommend follow-up to resolution. No other interval changes or acute abnormalities identified. IMPRESSION: New small right-sided pleural effusion with underlying opacity. Recommend follow-up to resolution. Electronically Signed   By: Dorise Bullion III M.D   On: 10/27/2015 07:34   Ct Abdomen Pelvis W Contrast  10/27/2015  CLINICAL DATA:  Generalized mid abdominal pain since yesterday with fever. Transverse myelitis, currently receiving plasmapheresis. EXAM: CT ABDOMEN AND PELVIS WITH CONTRAST TECHNIQUE: Multidetector CT imaging of the abdomen and pelvis was performed using the standard protocol following bolus administration of intravenous contrast. CONTRAST:  151mL OMNIPAQUE IOHEXOL 300 MG/ML SOLN, 26mL OMNIPAQUE IOHEXOL  300 MG/ML SOLN COMPARISON:  11/09/2008, 01/18/2014 FINDINGS: Lower chest: Right lower lobe hypodense filling defect within the pulmonary artery compatible with a right lower lobe pulmonary embolus. Dense right basilar consolidative opacity, suspect a combination of atelectasis and pulmonary infarct. Difficult to exclude pneumonia. Also, there is dense left lower lobe collapse/consolidation. Trace right pleural effusion. Heart is enlarged. No pericardial effusion. Right IJ dialysis catheter tip extends into the hepatic IVC which may be related to low chest volume. Hepatobiliary: No masses or other significant abnormality. Pancreas: No mass, inflammatory changes, or other significant abnormality. Spleen: Within normal limits in size and appearance. Adrenals/Urinary Tract: Normal adrenal glands. Kidneys demonstrate punctate tiny nonobstructing intrarenal calculi and as before. No acute obstructing urinary tract calculus, hydronephrosis, or obstructive uropathy. Stomach/Bowel: No evidence of obstruction, inflammatory process, or abnormal fluid collections. Normal appendix. Vascular/Lymphatic: No pathologically enlarged lymph nodes. No evidence of abdominal aortic aneurysm. Reproductive: Uterus and adnexal normal in size.  No free fluid. Other: No inguinal abnormality or hernia.  Intact abdominal wall. Musculoskeletal:  No acute osseous finding. IMPRESSION: Acute right lower lobe pulmonary embolus with associated right lower lobe atelectasis/ pulmonary infarct. Difficult to exclude pneumonia. Dense left lower lobe collapse/ consolidation as well. Right IJ hemodialysis catheter tip extends into the hepatic IVC, suspect low position related to overall low chest volume. Nonobstructing nephrolithiasis No acute intra-abdominal or pelvic process These results were called by telephone at the time of interpretation on 10/27/2015 at 8:52 am to Andrea Berry, [facility] , who verbally acknowledged these results. Electronically Signed    By: Jerilynn Mages.  Shick M.D.   On: 10/27/2015 08:54   I have personally reviewed and evaluated these images and lab results as part of my medical decision-making.   EKG Interpretation   Date/Time:  Thursday October 27 2015 09:15:26 EST Ventricular Rate:  110 PR Interval:  120 QRS Duration: 77 QT Interval:  299 QTC Calculation: 404 R Axis:   54 Text Interpretation:  Sinus rhythm ST and T wave changes improved from  prior. Baseline wander in lead(s) V5 Confirmed by PICKERING  MD, NATHAN  [phone]) on 10/27/2015 9:18:39 AM      Filed Vitals:   10/27/15 0537 10/27/15 0545  BP: 111/57   Pulse: 121   Temp: 100.7 F (38.2 C) 101.7 F (38.7 C)  TempSrc: Oral Rectal  Resp: 17   SpO2: 93%      MDM   Meds given in ED:  Medications  ondansetron (ZOFRAN) injection 4 mg (not administered)  iohexol (OMNIPAQUE) 300 MG/ML solution 50 mL (50 mLs Oral Contrast Given 10/27/15 0730)  vancomycin (VANCOCIN) IVPB 1000  mg/200 mL premix (not administered)  ceFEPIme (MAXIPIME) 1 g in dextrose 5 % 50 mL IVPB (not administered)  predniSONE (DELTASONE) tablet 5 mg (not administered)  pregabalin (LYRICA) capsule 50 mg (not administered)  azaTHIOprine (IMURAN) tablet 50 mg (not administered)  pantoprazole (PROTONIX) EC tablet 40 mg (not administered)  0.9 %  sodium chloride infusion (not administered)  ondansetron (ZOFRAN) tablet 4 mg (not administered)    Or  ondansetron (ZOFRAN) injection 4 mg (not administered)  sodium chloride flush (NS) 0.9 % injection 3 mL (3 mLs Intravenous Not Given 10/27/15 1331)  acetaminophen (TYLENOL) tablet 650 mg (not administered)    Or  acetaminophen (TYLENOL) suppository 650 mg (not administered)  HYDROcodone-acetaminophen (NORCO/VICODIN) 5-325 MG per tablet 1-2 tablet (not administered)  morphine 2 MG/ML injection 2 mg (not administered)  docusate sodium (COLACE) capsule 100 mg (not administered)  polyethylene glycol (MIRALAX / GLYCOLAX) packet 17 g (not administered)   bisacodyl (DULCOLAX) EC tablet 5 mg (not administered)  sodium chloride 0.9 % bolus 1,000 mL (1,000 mLs Intravenous New Bag/Given 10/27/15 1330)    Followed by  sodium chloride 0.9 % bolus 500 mL (500 mLs Intravenous New Bag/Given 10/27/15 1331)  heparin ADULT infusion 100 units/mL (25000 units/250 mL) (1,200 Units/hr Intravenous New Bag/Given 10/27/15 1201)  acetaminophen (TYLENOL) tablet 650 mg (650 mg Oral Given 10/27/15 0548)  sodium chloride 0.9 % bolus 1,000 mL (0 mLs Intravenous Stopped 10/27/15 1022)  morphine 4 MG/ML injection 4 mg (4 mg Intravenous Given 10/27/15 0702)  iohexol (OMNIPAQUE) 300 MG/ML solution 100 mL (100 mLs Intravenous Contrast Given 10/27/15 0821)  ceFEPIme (MAXIPIME) 2 g in dextrose 5 % 50 mL IVPB (0 g Intravenous Stopped 10/27/15 1038)  vancomycin (VANCOCIN) IVPB 1000 mg/200 mL premix (1,000 mg Intravenous New Bag/Given 10/27/15 1023)  morphine 4 MG/ML injection 4 mg (4 mg Intravenous Given 10/27/15 1031)  0.9 %  sodium chloride infusion ( Intravenous New Bag/Given 10/27/15 1151)  heparin bolus via infusion 2,000 Units (2,000 Units Intravenous Given 10/27/15 1203)    New Prescriptions   No medications on file    Final diagnoses:  Other acute pulmonary embolism without acute cor pulmonale (HCC)  Tachycardia  Generalized abdominal pain  HCAP (healthcare-associated pneumonia)   This is a 50 y.o. female who presents to the emergency department complaining of bilateral lower abdominal pain and fever since yesterday. She reports gradual worsening of her abdominal pain since yesterday around 6 PM. She reports nausea without vomiting. Patient reports she has not had a good bowel movement in the past week. She reports receiving an enema at her skilled nursing facility and had a small bowel movement prior to arrival today. She currently complains of 10 out of 10 bilateral lower abdominal pain with some slight upper abdominal pain. She denies diarrhea or hematochezia. She denies  previous abdominal surgeries.  On exam patient has an initial temperature 101.7 rectally. She is nontoxic appearing. She has mild bilateral abdominal tenderness to palpation. No peritoneal signs. Lungs sounds are slightly diminished bilaterally. No tachypnea.  Patient's CBC returned with hemoglobin of 8. This is a 2. drop since her last blood work about 3 weeks ago. She is guaiac negative. She has leukocytosis with a white count of 13,000. Lactic acid is normal. Urinalysis is negative for infection. Chest x-ray returned with the right small pleural effusion. CT abdomen and pelvis with contrast indicated an acute right lower lobe PE with associated right lower lobe atelectasis/pulmonary infarct. Could also be pneumonia. No acute intra-abdominal or  pelvic process was indicated on CT abdomen and pelvis. Suspect pneumonia with PE. Will start on vancomycin and cefepime. This will also cover for any suspected line sepsis with her central line catheter. Blood cultures ordered from her peripheral and central line. Will hold off on heparin with her HGB of 8. Patient will need admission for CT A chest. She has already received contrast today and will have to wait until tomorrow for CT angiogram. I discussed the findings with the patient and she is in agreement with admission. No previous history of PE. Patient does have systemic lupus. I consulted with Nevin [facility]good NP [facility] who [facility] for admission. She requested temporary admission orders to be placed.   This patient was discussed with Dr. Alvino Chapel who agrees with assessment and plan.      Andrea Pean, PA-C 10/27/15 [place], MD 10/27/15 [phone]

## 2015-10-27 NOTE — Progress Notes (Signed)
Pharmacy Consult Note - Heparin Follow Up  Labs: 0.51  A/P: Heparin level therapeutic (goal 0.3-0.7) on current rate of 1200 units/hr. No problems reported per RN. Note that heparin infusion was not charted against when heparin was started but was running as ordered when night shift RN started. Continue current heparin IV rate. Recheck heparin level with AM labs  Adrian Saran, PharmD, BCPS Pager [phone] 10/27/2015 9:10 PM

## 2015-10-27 NOTE — ED Notes (Signed)
Second set of cultures not drawn. Attending Will, PA made aware.

## 2015-10-27 NOTE — ED Notes (Signed)
Patient transported to CT 

## 2015-10-27 NOTE — ED Notes (Signed)
Bed: WA09 Expected date:  Expected time:  Means of arrival:  Comments: EMS- abdominal pain 

## 2015-10-27 NOTE — Clinical Social Work Note (Signed)
Clinical Social Work Assessment  Patient Details  Name: Andrea Berry MRN: [id number] Date of Birth: [birth year]  Date of referral:  10/27/15               Reason for consult:  Other (Comment Required) (Patient from [employer])                Permission sought to share information with:    Permission granted to share information::     Name::        Agency::     Relationship::     Contact Information:     Housing/Transportation Living arrangements for the past 2 months:  [name] (Patient from [employer]) Source of Information:  Patient Patient Interpreter Needed:  None Criminal Activity/Legal Involvement Pertinent to Current Situation/Hospitalization:  No - Comment as needed Significant Relationships:    Lives with:  Facility Resident Do you feel safe going back to the place where you live?    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) PT Recommendations:    Information / Referral to community resources:     Patient/Family's Response to care: Unknown at this time  Patient/Family's Understanding of and Emotional Response to Diagnosis, Current Treatment, and Prognosis: Unknown at this time  Emotional Assessment Appearance:  Appears stated age Attitude/Demeanor/Rapport:  Other (Patient appeared in some pain) Affect (typically observed):  Flat Orientation:  Oriented to Self, Oriented to Place, Oriented to  Time, Oriented to Situation Alcohol / Substance use:  Not Applicable Psych involvement (Current and /or in the community):  No (Comment)  Discharge Needs  Concerns to be addressed:    Readmission within the last 30 days:    Current discharge risk:    Barriers to Discharge:  No Barriers Identified   Andrea Sabin, LCSW 10/27/2015, 2:58 PM

## 2015-10-27 NOTE — H&P (Signed)
[facility] History and Physical  Andrea Berry [id number] DOB: 03/03/1966 DOA: 10/27/2015  Referring physician: Emergency Department PCP: Minette Brine   CHIEF COMPLAINT:                   HPI: Andrea Berry is a 50 y.o. female with a PMH not limited to HTN, GERD, remote CVA. and SLE. she is on chronic immunosuppressants. Patient was hospitalized late last month for left arm weakness. She  Has Devic's disease and was treated with steroids and plasma exchange. Patient was discharged to Rehab center. Yesterday patient began having diffuse, constant abdominal pain which is exacerbated by movement. She endorses constipation but poor response to enema yesterday. States " this doesn't feel like constipation". No nausea. Pain doesn't radiate. No urinary symptoms.     Morphine in ED helped  ED COURSE:           Labs:   Lipase 23 Lactic acid 1.2, 1.0 WBC 132, c02 21 Renal function normal.    Urinalysis:   unremarkable             EKG:    Sinus rhythm ST and T wave changes improved from prior. Baseline wander in lead(s) V5 Confirmed by Alvino Chapel MD, NATHAN [id number]) on 10/27/2015 9:18:39 AM           Vrate 110         Medications  ondansetron (ZOFRAN) injection 4 mg (not administered)  iohexol (OMNIPAQUE) 300 MG/ML solution 50 mL (50 mLs Oral Contrast Given 10/27/15 0730)  ceFEPIme (MAXIPIME) 2 g in dextrose 5 % 50 mL IVPB (2 g Intravenous New Bag/Given 10/27/15 0958)  vancomycin (VANCOCIN) IVPB 1000 mg/200 mL premix (not administered)  vancomycin (VANCOCIN) IVPB 1000 mg/200 mL premix (not administered)  ceFEPIme (MAXIPIME) 1 g in dextrose 5 % 50 mL IVPB (not administered)  acetaminophen (TYLENOL) tablet 650 mg (650 mg Oral Given 10/27/15 0548)  sodium chloride 0.9 % bolus 1,000 mL (1,000 mLs Intravenous New Bag/Given 10/27/15 0701)  morphine 4 MG/ML injection 4 mg (4 mg Intravenous Given 10/27/15 0702)  iohexol (OMNIPAQUE) 300 MG/ML solution 100 mL (100 mLs Intravenous Contrast  Given 10/27/15 0821)    Review of Systems  Constitutional: Positive for fever.  HENT: Negative.   Eyes: Negative.   Respiratory: Negative.   Cardiovascular: Negative.   Gastrointestinal: Positive for abdominal pain and constipation.  Genitourinary: Negative.   Musculoskeletal: Negative.   Skin: Negative.   Neurological: Negative.   Endo/Heme/Allergies: Negative.   Psychiatric/Behavioral: Negative.     Past Medical History  Diagnosis Date  . Hypertension   . Blind   . Lupus ([place])   . Kidney stones   . Shortness of breath     due to medication  . Depression   . GERD (gastroesophageal reflux disease)   . Headache(784.0)   . Arthritis   . Stroke [facility]) 2008    visually impaired   Past Surgical History  Procedure Laterality Date  . Tubal ligation    . Tubal ligation    . Breast biopsy      SOCIAL HISTORY:  reports that she has never smoked. She does not have any smokeless tobacco history on file. She reports that she does not drink alcohol or use illicit drugs. Lives:   Currently in Rehab facility       Assistive devices:  walker needed for ambulation.   No Known Allergies  Family History  Problem Relation Age of Onset  . Cancer Mother   .  Cancer Father     Prior to Admission medications   Medication Sig Start Date End Date Taking? Authorizing Provider  azaTHIOprine (IMURAN) 50 MG tablet Take 50 mg by mouth daily.  02/10/13  Yes Historical Provider, MD  ibandronate (BONIVA) 150 MG tablet Take 150 mg by mouth every 30 (thirty) days.  02/27/13  Yes Historical Provider, MD  Multiple Vitamin (MULTIVITAMIN) capsule Take 1 capsule by mouth daily.   Yes Historical Provider, MD  omeprazole (PRILOSEC) 20 MG capsule Take 20 mg by mouth daily.  01/18/13  Yes Historical Provider, MD  predniSONE (DELTASONE) 5 MG tablet Take 5 mg by mouth daily with breakfast.   Yes Historical Provider, MD  pregabalin (LYRICA) 50 MG capsule Take 50 mg by mouth 3 (three) times daily.   Yes Historical  Provider, MD  LORazepam (ATIVAN) 0.5 MG tablet Take 1 tablet (0.5 mg total) by mouth every 8 (eight) hours as needed for anxiety. Patient not taking: Reported on 10/27/2015 10/06/15   Verlee Monte, MD   PHYSICAL EXAM: Filed Vitals:   10/27/15 0537 10/27/15 0545 10/27/15 0708  BP: 111/57  109/58  Pulse: 121  112  Temp: 100.7 F (38.2 C) 101.7 F (38.7 C)   TempSrc: Oral Rectal   Resp: 17  20  SpO2: 93%  91%    Wt Readings from Last 3 Encounters:  09/30/15 87.272 kg (192 lb 6.4 oz)  02/08/14 73.199 kg (161 lb 6 oz)  01/29/14 75.297 kg (166 lb)    General:  Pleasant  Black female. Appears uncomfortable with abdominal pain.  Eyes: PER, normal lids, irises & conjunctiva ENT: grossly normal hearing, lips & tongue Neck: no LAD, no masses Cardiovascular: sinus tachycardia., no murmurs. No LE edema.  Respiratory: Respirations even and unlabored. Normal respiratory effort. Decreased breath sounds in  BLL, no wheezes / rales .   Abdomen: soft, non-distended, non-tender, active bowel sounds. No obvious masses.  Skin: no rash seen on limited exam Musculoskeletal: grossly normal tone BUE/BLE Psychiatric: grossly normal mood and affect, speech fluent and appropriate Neurologic: grossly non-focal.         LABS ON ADMISSION:    Basic Metabolic Panel:  Recent Labs Lab 10/27/15 0613  NA 132*  K 4.3  CL 102  CO2 21*  GLUCOSE 124*  BUN 12  CREATININE 0.87  CALCIUM 9.3   Liver Function Tests:  Recent Labs Lab 10/27/15 0613  AST 48*  ALT 38  ALKPHOS 63  BILITOT 1.2  PROT 8.2*  ALBUMIN 3.4*    CBC:  Recent Labs Lab 10/27/15 0613  WBC 13.7*  HGB 8.0*  HCT 26.5*  MCV 75.9*  PLT 299    Creatinine clearance cannot be calculated (Unknown ideal weight.)  Radiological Exams on Admission: Dg Chest 2 View  10/27/2015  CLINICAL DATA:  Shortness of breath and fever. EXAM: CHEST  2 VIEW COMPARISON:  September 22, 2015 FINDINGS: There is a double lumen right central line  terminating within the right atrium, unchanged since September 23, 2015. No pneumothorax. The cardiomediastinal silhouette is stable. There is a new small right-sided pleural effusion with underlying opacity, possibly atelectasis. Recommend follow-up to resolution. No other interval changes or acute abnormalities identified. IMPRESSION: New small right-sided pleural effusion with underlying opacity. Recommend follow-up to resolution. Electronically Signed   By: Dorise Bullion III M.D   On: 10/27/2015 07:34   Ct Abdomen Pelvis W Contrast  10/27/2015  CLINICAL DATA:  Generalized mid abdominal pain since yesterday with fever. Transverse myelitis,  currently receiving plasmapheresis. EXAM: CT ABDOMEN AND PELVIS WITH CONTRAST TECHNIQUE: Multidetector CT imaging of the abdomen and pelvis was performed using the standard protocol following bolus administration of intravenous contrast. CONTRAST:  142mL OMNIPAQUE IOHEXOL 300 MG/ML SOLN, 61mL OMNIPAQUE IOHEXOL 300 MG/ML SOLN COMPARISON:  11/09/2008, 01/18/2014 FINDINGS: Lower chest: Right lower lobe hypodense filling defect within the pulmonary artery compatible with a right lower lobe pulmonary embolus. Dense right basilar consolidative opacity, suspect a combination of atelectasis and pulmonary infarct. Difficult to exclude pneumonia. Also, there is dense left lower lobe collapse/consolidation. Trace right pleural effusion. Heart is enlarged. No pericardial effusion. Right IJ dialysis catheter tip extends into the hepatic IVC which may be related to low chest volume. Hepatobiliary: No masses or other significant abnormality. Pancreas: No mass, inflammatory changes, or other significant abnormality. Spleen: Within normal limits in size and appearance. Adrenals/Urinary Tract: Normal adrenal glands. Kidneys demonstrate punctate tiny nonobstructing intrarenal calculi and as before. No acute obstructing urinary tract calculus, hydronephrosis, or obstructive uropathy.  Stomach/Bowel: No evidence of obstruction, inflammatory process, or abnormal fluid collections. Normal appendix. Vascular/Lymphatic: No pathologically enlarged lymph nodes. No evidence of abdominal aortic aneurysm. Reproductive: Uterus and adnexal normal in size.  No free fluid. Other: No inguinal abnormality or hernia.  Intact abdominal wall. Musculoskeletal:  No acute osseous finding. IMPRESSION: Acute right lower lobe pulmonary embolus with associated right lower lobe atelectasis/ pulmonary infarct. Difficult to exclude pneumonia. Dense left lower lobe collapse/ consolidation as well. Right IJ hemodialysis catheter tip extends into the hepatic IVC, suspect low position related to overall low chest volume. Nonobstructing nephrolithiasis No acute intra-abdominal or pelvic process These results were called by telephone at the time of interpretation on 10/27/2015 at 8:52 am to Waynetta Pean, [facility] , who verbally acknowledged these results. Electronically Signed   By: Jerilynn Mages.  Shick M.D.   On: 10/27/2015 08:54     ASSESSMENT / PLAN   Pulmonary thromboembolism.  -Admit to telemetry -Heparin per pharmacy -2D echo to evaluate for heart strain -cycle cardiac enzymes -PT, OT consults prior to discharge. She is getting Rehab at outpatient facility.   Fever  / possible PNA  (HCAP) on imaging studies.  -treat empirically for HCAP and sepsis order sets.  -02 prn -Blood cultures pending -U/A negative -she has a central line in right chest placed several weeks ago for plasma exchange. Line not in use. If   blood cultres positive the line should be pulled.   Abdominal pain, diffuse. Started yesterday. No evidence for ischemia on CTscan. Endorses constipation.  CTscan abd/pelvus unrevealing. U/A negative. Will treat for constipation and monitor  systemic lupus erythematosus.  -continue home prednisone and imuran          Normocytic anemia, heme negative today  History of Devic's disease. Hospitalized late  January with left arm weakness. Treated with steroids and plasma exchange  GERD.  -continue home PPI  CONSULTANTS:   None   Code Status: full code DVT Prophylaxis: already anti-coagulated Family Communication:  Patient alert, oriented and understands plan of care.  Disposition Plan: Discharge to home in 2-3 days   Time spent: 60 minutes Tye Savoy  NP [facility] Pager [phone]

## 2015-10-27 NOTE — ED Notes (Signed)
Heparin infusion and bolus verified with Clint Guy, RN

## 2015-10-27 NOTE — Progress Notes (Signed)
*  PRELIMINARY RESULTS* Echocardiogram 2D Echocardiogram has been performed.  Andrea Berry 10/27/2015, 4:01 PM

## 2015-10-27 NOTE — ED Notes (Signed)
Spoke with EDP - explained that patient has a restricted arm and an IV in the other hand.  He ok'd shutting off fluids so labs can be drawn.  Knows i have to wait 30-40 min before i can draw labs.

## 2015-10-27 NOTE — ED Notes (Signed)
According to EMS, pt reports progressive abd pain since 6pm 2/15. Pt received an enema administered by her SNF. Upon arrival to scene EMS obtained a 101.8 Tympanic result. Pt hot to touch. Pt A+OX4, speaking in complete sentences.

## 2015-10-27 NOTE — ED Notes (Signed)
Pt has L restriction, IV in R hand and R upper arm.  Wont be able to stick R arm any time soon due to fluids/meds.  Got the ok from Will PA and Dr Alvino Chapel to stick L arm.

## 2015-10-27 NOTE — Progress Notes (Signed)
Pharmacy Antibiotic Note  Andrea Berry is a 50 y.o. female admitted on 10/27/2015 with possible pneumonia.  Pharmacy has been consulted for vancomycin and cefepime dosing.  Plan:  Vancomycin 1g in ED, then continue 1g IV q12h Check trough at steady state Cefepime 2g in ED, then 1g IV q8h Follow up renal function & cultures De-escalate as appropriate    Temp (24hrs), Avg:101.2 F (38.4 C), Min:100.7 F (38.2 C), Max:101.7 F (38.7 C)   Recent Labs Lab 10/27/15 0613  WBC 13.7*  CREATININE 0.87  LATICACIDVEN 1.0    CrCl cannot be calculated (Unknown ideal weight.).    No Known Allergies  Antimicrobials this admission: 2/16 >> Vancomycin >> 2/16 >> Cefepime >>  Dose adjustments this admission: ---  Microbiology results: 2/16 BCx: sent 2/16 UCx: sent   Thank you for allowing pharmacy to be a part of this patient's care.  Peggyann Juba, PharmD, BCPS Pager: [phone] 10/27/2015 9:22 AM

## 2015-10-28 ENCOUNTER — Inpatient Hospital Stay (HOSPITAL_COMMUNITY): Payer: Medicare Other

## 2015-10-28 DIAGNOSIS — R1013 Epigastric pain: Secondary | ICD-10-CM

## 2015-10-28 DIAGNOSIS — D638 Anemia in other chronic diseases classified elsewhere: Secondary | ICD-10-CM

## 2015-10-28 DIAGNOSIS — I2699 Other pulmonary embolism without acute cor pulmonale: Secondary | ICD-10-CM

## 2015-10-28 DIAGNOSIS — G360 Neuromyelitis optica [Devic]: Secondary | ICD-10-CM

## 2015-10-28 HISTORY — DX: Anemia in other chronic diseases classified elsewhere: D63.8

## 2015-10-28 LAB — URINE CULTURE: CULTURE: NO GROWTH

## 2015-10-28 LAB — HEPARIN LEVEL (UNFRACTIONATED): Heparin Unfractionated: 0.59 IU/mL (ref 0.30–0.70)

## 2015-10-28 LAB — CBC
HEMATOCRIT: 25.9 % — AB (ref 36.0–46.0)
Hemoglobin: 7.9 g/dL — ABNORMAL LOW (ref 12.0–15.0)
MCH: 23 pg — AB (ref 26.0–34.0)
MCHC: 30.5 g/dL (ref 30.0–36.0)
MCV: 75.3 fL — AB (ref 78.0–100.0)
Platelets: 263 10*3/uL (ref 150–400)
RBC: 3.44 MIL/uL — ABNORMAL LOW (ref 3.87–5.11)
RDW: 15.5 % (ref 11.5–15.5)
WBC: 12.8 10*3/uL — AB (ref 4.0–10.5)

## 2015-10-28 LAB — MRSA PCR SCREENING: MRSA by PCR: NEGATIVE

## 2015-10-28 MED ORDER — MAGNESIUM CITRATE PO SOLN
1.0000 | Freq: Once | ORAL | Status: AC
  Administered 2015-10-28: 1 via ORAL

## 2015-10-28 MED ORDER — LORAZEPAM 0.5 MG PO TABS
0.5000 mg | ORAL_TABLET | ORAL | Status: DC | PRN
Start: 2015-10-28 — End: 2015-11-02
  Administered 2015-10-28 – 2015-11-02 (×12): 0.5 mg via ORAL
  Filled 2015-10-28 (×12): qty 1

## 2015-10-28 MED ORDER — LIDOCAINE HCL 1 % IJ SOLN
INTRAMUSCULAR | Status: AC
  Filled 2015-10-28: qty 20

## 2015-10-28 NOTE — Progress Notes (Signed)
Patient ID: Andrea Berry, female   DOB: 1966/04/09, 50 y.o.   MRN: [id number]  [facility] PROGRESS NOTE  KELSEE SKEETE [id number] DOB: [birth year] DOA: 10/27/2015 PCP: Minette Brine   Brief narrative:    50 y.o. female with HTN, GERD, remote CVA. and SLE. on chronic immunosuppressants, presented with diffuse, constant abdominal pain which is exacerbated by movement. Abd imaging studies done in ED and notable for PE. Pt started on heparin drip and [facility] asked to admit for further evaluation.   Assessment/Plan:    Principal Problem:   Pulmonary embolism with infarction ([place]) - RUE positive for mobile acute deep vein thrombosis involving the right internal jugular vein - pt on Heparin drip, continue for now, pt would be candidate for Xarelto, discussion in progress - removed IJ cath by IR team  Active Problems:   Sepsis due to pneumonia, HCAP ([place]) - continue vancomycin and maxipime day #2 - no plan to taper down yet as pt still febrile and with leukocytosis  - monitor clinical progress    Acute epigastric pain - possibly related to the acute illness - analgesia as needed     SLE (systemic lupus erythematosus) ([place]) - continue home medical regimen     Devic's disease ([place])    Anemia of chronic disease - no signs of bleeding   DVT prophylaxis - on full dose Heparin   Code Status: Full.  Family Communication:  plan of care discussed with the patient Disposition Plan: Home by 2/20   IV access:  Peripheral IV  Procedures and diagnostic studies:    Dg Chest 2 View 10/27/2015  New small right-sided pleural effusion with underlying opacity.   Ct Abdomen Pelvis W Contrast 10/27/2015 Acute right lower lobe pulmonary embolus with associated right lower lobe atelectasis/ pulmonary infarct. Difficult to exclude pneumonia. Dense left lower lobe collapse/ consolidation as well. Right IJ hemodialysis catheter tip extends into the hepatic IVC, suspect low position related to  overall low chest volume. Nonobstructing nephrolithiasis No acute intra-abdominal or pelvic process.  Ir Removal Tun Cv Cath W/o Fl 10/28/2015  Successful removal of (R)IJ plasmapheresis catheter. No complications.   Medical Consultants:  IR  Other Consultants:  None  IAnti-Infectives:   Vancomycin 2/16 --> Maxipime 2/16 -->  Faye Ramsay, MD  [facility] Pager [phone]  If 7PM-7AM, please contact night-coverage [email or web address] Password [facility] 10/28/2015, 4:29 PM   LOS: 1 day   HPI/Subjective: No events overnight. Still with abd pain, nausea.   Objective: Filed Vitals:   10/27/15 2109 10/27/15 2309 10/28/15 0544 10/28/15 1411  BP: 122/68  146/60 144/68  Pulse: 126  118 108  Temp: 102.4 F (39.1 C) 99.2 F (37.3 C) 100.9 F (38.3 C) 99.4 F (37.4 C)  TempSrc: Oral Oral Oral Oral  Resp: 18  18 18   Height:      Weight:      SpO2: 93%  95% 97%    Intake/Output Summary (Last 24 hours) at 10/28/15 1629 Last data filed at 10/28/15 1413  Gross per 24 hour  Intake    924 ml  Output   2150 ml  Net  -1226 ml    Exam:   General:  Pt is alert, follows commands appropriately, not in acute distress  Cardiovascular: Regular rhythm, tachycardic, S1/S2, no rubs, no gallops  Respiratory: Clear to auscultation bilaterally, no wheezing, no crackles, no rhonchi  Abdomen: Soft, mildly tender in epigastric area, non distended, bowel sounds present, no guarding  Extremities: No edema, pulses DP  and PT palpable bilaterally  Neuro: Grossly nonfocal  Data Reviewed: Basic Metabolic Panel:  Recent Labs Lab 10/27/15 0613  NA 132*  K 4.3  CL 102  CO2 21*  GLUCOSE 124*  BUN 12  CREATININE 0.87  CALCIUM 9.3   Liver Function Tests:  Recent Labs Lab 10/27/15 0613  AST 48*  ALT 38  ALKPHOS 63  BILITOT 1.2  PROT 8.2*  ALBUMIN 3.4*    Recent Labs Lab 10/27/15 0613  LIPASE 23   CBC:  Recent Labs Lab 10/27/15 0613 10/27/15 2035 10/28/15 1050  WBC 13.7*  12.9* 12.8*  HGB 8.0* 9.8* 7.9*  HCT 26.5* 32.6* 25.9*  MCV 75.9* 76.2* 75.3*  PLT 299 224 263   Cardiac Enzymes:  Recent Labs Lab 10/27/15 1255 10/27/15 2035  TROPONINI 0.03 <0.03   Recent Results (from the past 240 hour(s))  Urine culture     Status: None   Collection Time: 10/27/15  8:12 AM  Result Value Ref Range Status   Specimen Description URINE, CATHETERIZED  Final   Special Requests NONE  Final   Culture   Final    NO GROWTH 1 DAY Performed at [facility]    Report Status 10/28/2015 FINAL  Final  MRSA PCR Screening     Status: None   Collection Time: 10/28/15 10:17 AM  Result Value Ref Range Status   MRSA by PCR NEGATIVE NEGATIVE Final     Scheduled Meds: . sodium chloride   Intravenous Once  . azaTHIOprine  50 mg Oral Daily  . ceFEPime (MAXIPIME) IV  1 g Intravenous Q8H  . docusate sodium  100 mg Oral BID  . lidocaine      . pantoprazole  40 mg Oral Daily  . predniSONE  5 mg Oral Q breakfast  . pregabalin  50 mg Oral TID  . sodium chloride flush  3 mL Intravenous Q12H  . vancomycin  1,000 mg Intravenous Q12H   Continuous Infusions: . sodium chloride 75 mL/hr at 10/28/15 0520  . heparin 1,200 Units/hr (10/28/15 0520)

## 2015-10-28 NOTE — Procedures (Signed)
Successful removal of (R)IJ plasmapheresis catheter. No complications.  Ascencion Dike PA-C Interventional Radiology 10/28/2015 11:48 AM

## 2015-10-28 NOTE — Progress Notes (Signed)
*  Preliminary Results* Right upper extremity venous duplex completed. Right upper extremity is positive for mobile acute deep vein thrombosis involving the right internal jugular vein.  Preliminary results discussed with Dr. Doyle Askew.  10/28/2015 9:26 AM  Maudry Mayhew, RVT, RDCS, RDMS

## 2015-10-28 NOTE — Care Management Note (Signed)
Case Management Note  Patient Details  Name: Andrea Berry MRN: [id number] Date of Birth: 1966/08/14  Subjective/Objective:  50 y/o f admitted w/PE. From ALF-[facility].PT cons-await recc.CSW following.                  Action/Plan:d/c plan ALF.   Expected Discharge Date:   (unknown)               Expected Discharge Plan:  Assisted Living / Rest Home  In-House Referral:     Discharge planning Services  CM Consult  Post Acute Care Choice:    Choice offered to:     DME Arranged:    DME Agency:     HH Arranged:    HH Agency:     Status of Service:  In process, will continue to follow  Medicare Important Message Given:    Date Medicare IM Given:    Medicare IM give by:    Date Additional Medicare IM Given:    Additional Medicare Important Message give by:     If discussed at [place] of Stay Meetings, dates discussed:    Additional Comments:  Dessa Phi, RN 10/28/2015, 3:14 PM

## 2015-10-28 NOTE — Progress Notes (Signed)
[place] for Heparin Indication: pulmonary embolus  No Known Allergies  Patient Measurements: Height: 5\' 1"  (154.9 cm) Weight: 156 lb (70.761 kg) (patient weighed on Friday) IBW/kg (Calculated) : 47.8 Heparin Dosing Weight: 63 kg  Vital Signs: Temp: 100.9 F (38.3 C) (02/17 0544) Temp Source: Oral (02/17 0544) BP: 146/60 mmHg (02/17 0544) Pulse Rate: 118 (02/17 0544)  Labs:  Recent Labs  10/27/15 0613 10/27/15 0918 10/27/15 1255 10/27/15 2035  HGB 8.0*  --   --  9.8*  HCT 26.5*  --   --  32.6*  PLT 299  --   --  224  APTT  --   --  36  --   LABPROT  --  16.4*  --   --   INR  --  1.31  --   --   HEPARINUNFRC  --   --   --  0.51  CREATININE 0.87  --   --   --   TROPONINI  --   --  0.03 <0.03    Estimated Creatinine Clearance: 71.9 mL/min (by C-G formula based on Cr of 0.87).   Medical History: Past Medical History  Diagnosis Date  . Hypertension   . Blind   . Lupus ([place])   . Kidney stones   . Shortness of breath     due to medication  . Depression   . GERD (gastroesophageal reflux disease)   . Headache(784.0)   . Arthritis   . Stroke [facility]) 2008    visually impaired     Assessment: 44 yoF presents with abdominal pain. CT found acute right lower lobe pulmonary embolus with associated right lower lobe atelectasis/ pulmonary infarct.  Pharmacy is consulted to dose heparin infusion. RUE venous duplex positive for mobile acute DVT.  Right IJ tunneled hemodialysis catheter is scheduled for removal on 2/17.  Today, 10/28/2015:  Heparin level 0.59 remains therapeutic (drawn ~ 6 hours late since pt is a very difficult lab stick and first attempts failed).  CBC: Hgb significantly decreased (9.8 > 7.9) , Plts remain WNL.  RN reports no bleeding or complications.  Goal of Therapy:  Heparin level 0.3-0.7 units/ml Monitor platelets by anticoagulation protocol: Yes   Plan:   Continue heparin IV infusion at 1200  units/hr  Daily heparin level and CBC  Continue to monitor H&H, monitor for s/s bleeding or thrombosis.  F/u long-term anticoagulation plan.  Gretta Arab PharmD, BCPS Pager [phone] 10/28/2015 7:07 AM

## 2015-10-29 ENCOUNTER — Encounter (HOSPITAL_COMMUNITY): Payer: Self-pay | Admitting: Radiology

## 2015-10-29 ENCOUNTER — Inpatient Hospital Stay (HOSPITAL_COMMUNITY): Payer: Medicare Other

## 2015-10-29 LAB — CBC
HEMATOCRIT: 23.8 % — AB (ref 36.0–46.0)
Hemoglobin: 7.2 g/dL — ABNORMAL LOW (ref 12.0–15.0)
MCH: 22.6 pg — AB (ref 26.0–34.0)
MCHC: 30.3 g/dL (ref 30.0–36.0)
MCV: 74.8 fL — AB (ref 78.0–100.0)
PLATELETS: 315 10*3/uL (ref 150–400)
RBC: 3.18 MIL/uL — ABNORMAL LOW (ref 3.87–5.11)
RDW: 15.6 % — AB (ref 11.5–15.5)
WBC: 8.5 10*3/uL (ref 4.0–10.5)

## 2015-10-29 LAB — BASIC METABOLIC PANEL
Anion gap: 8 (ref 5–15)
BUN: 6 mg/dL (ref 6–20)
CALCIUM: 8.4 mg/dL — AB (ref 8.9–10.3)
CHLORIDE: 111 mmol/L (ref 101–111)
CO2: 19 mmol/L — AB (ref 22–32)
CREATININE: 0.62 mg/dL (ref 0.44–1.00)
GFR calc Af Amer: >60 mL/min (ref >60)
GFR calc non Af Amer: >60 mL/min (ref >60)
GLUCOSE: 80 mg/dL (ref 65–99)
Potassium: 3.6 mmol/L (ref 3.5–5.1)
Sodium: 138 mmol/L (ref 135–145)

## 2015-10-29 LAB — EXPECTORATED SPUTUM ASSESSMENT W GRAM STAIN, RFLX TO RESP C

## 2015-10-29 LAB — EXPECTORATED SPUTUM ASSESSMENT W REFEX TO RESP CULTURE

## 2015-10-29 LAB — HEPARIN LEVEL (UNFRACTIONATED): Heparin Unfractionated: 1.07 IU/mL — ABNORMAL HIGH (ref 0.30–0.70)

## 2015-10-29 MED ORDER — IOHEXOL 300 MG/ML  SOLN: 100.0000 mL | Freq: Once | INTRAMUSCULAR | Status: DC | PRN

## 2015-10-29 MED ORDER — IOHEXOL 300 MG/ML  SOLN
50.0000 mL | Freq: Once | INTRAMUSCULAR | Status: AC | PRN
  Administered 2015-10-29: 50 mL via ORAL

## 2015-10-29 MED ORDER — HYDROMORPHONE HCL 2 MG/ML IJ SOLN
INTRAMUSCULAR | Status: AC
  Administered 2015-10-29: 1 mg
  Filled 2015-10-29: qty 1

## 2015-10-29 MED ORDER — RIVAROXABAN 15 MG PO TABS
15.0000 mg | ORAL_TABLET | ORAL | Status: AC
  Administered 2015-10-29: 15 mg via ORAL
  Filled 2015-10-29: qty 1

## 2015-10-29 MED ORDER — HYDROMORPHONE HCL 1 MG/ML IJ SOLN
1.0000 mg | INTRAMUSCULAR | Status: DC | PRN
Start: 2015-10-29 — End: 2015-11-02
  Administered 2015-10-29 – 2015-11-02 (×15): 1 mg via INTRAVENOUS
  Filled 2015-10-29 (×15): qty 1

## 2015-10-29 MED ORDER — RIVAROXABAN 15 MG PO TABS
15.0000 mg | ORAL_TABLET | Freq: Two times a day (BID) | ORAL | Status: DC
  Administered 2015-10-29 – 2015-11-02 (×8): 15 mg via ORAL
  Filled 2015-10-29 (×9): qty 1

## 2015-10-29 NOTE — Progress Notes (Addendum)
[place] for Xarelto Indication: pulmonary embolus, DVT  No Known Allergies  Patient Measurements: Height: 5\' 1"  (154.9 cm) Weight: 156 lb (70.761 kg) (patient weighed on Friday) IBW/kg (Calculated) : 47.8 Heparin Dosing Weight: 63 kg  Vital Signs: Temp: 100 F (37.8 C) (02/18 0500) Temp Source: Oral (02/18 0500) BP: 122/70 mmHg (02/18 0500) Pulse Rate: 102 (02/18 0500)  Labs:  Recent Labs  10/27/15 0613 10/27/15 0918 10/27/15 1255 10/27/15 2035 10/28/15 1050 10/29/15 0602  HGB 8.0*  --   --  9.8* 7.9* 7.2*  HCT 26.5*  --   --  32.6* 25.9* 23.8*  PLT 299  --   --  224 263 315  APTT  --   --  36  --   --   --   LABPROT  --  16.4*  --   --   --   --   INR  --  1.31  --   --   --   --   HEPARINUNFRC  --   --   --  0.51 0.59 1.07*  CREATININE 0.87  --   --   --   --  0.62  TROPONINI  --   --  0.03 <0.03  --   --     Estimated Creatinine Clearance: 78.2 mL/min (by C-G formula based on Cr of 0.62).   Assessment: 52 yoF presents with abdominal pain. CT found acute right lower lobe pulmonary embolus with associated right lower lobe atelectasis/ pulmonary infarct.  Pharmacy is consulted to dose heparin infusion. RUE venous duplex positive for mobile acute DVT.  Right IJ tunneled hemodialysis catheter removed on 2/17.  Today, 10/29/2015:  Heparin level above target range this morning, dose was reduced.  CBC: Hgb cont to trend down. Plts remain WNL.  RN reports no bleeding or complications.  SCr 0.62 with CrCl ~ 78 ml/min  Drug-drug interactions: no major interactions noted.  Goal of Therapy:  Monitor platelets by anticoagulation protocol: Yes   Plan:   D/C heparin drip  Xarelto 15 mg PO BID with meals x 21 days, then 20 mg PO once daily with supper.  Follow up renal function and CBC  Monitor for s/s of bleeding or thrombosis.  Pharmacist to provide education prior to discharge.  Gretta Arab PharmD, BCPS Pager  [phone] 10/29/2015 1:15 PM    Addendum: Pharmacist education completed.  Gretta Arab PharmD, BCPS Pager [phone] 10/29/2015 6:41 PM

## 2015-10-29 NOTE — Progress Notes (Signed)
Patient ID: Andrea Berry, female   DOB: 1966/07/15, 50 y.o.   MRN: [id number]  [facility] PROGRESS NOTE  Andrea Berry [id number] DOB: Oct 08, 1965 DOA: 10/27/2015 PCP: Minette Brine   Brief narrative:    50 y.o. female with HTN, GERD, remote CVA. and SLE. on chronic immunosuppressants, presented with diffuse, constant abdominal pain which is exacerbated by movement. Abd imaging studies done in ED and notable for PE. Pt started on heparin drip and [facility] asked to admit for further evaluation.   Assessment/Plan:    Principal Problem:   Pulmonary embolism with infarction ([place]) - RUE positive for mobile acute deep vein thrombosis involving the right internal jugular vein - transition to Xarelto per pharmacy  - removed IJ cath by IR team  Active Problems:   Sepsis due to pneumonia, HCAP ([place]) - continue vancomycin and maxipime day #3 - stop vanc but continue maxipime  - monitor clinical progress    Acute epigastric pain - possibly related to the acute illness - still with pain, CT abd w/o contrast requested  - analgesia as needed     SLE (systemic lupus erythematosus) ([place]) - continue home medical regimen     Devic's disease ([place])    Anemia of chronic disease - drop in Hg, close monitoring, transfuse for Hg < 7 - CBC in AM  DVT prophylaxis - Xarelto per pharmacy   Code Status: Full.  Family Communication:  plan of care discussed with the patient Disposition Plan: Home by 2/20   IV access:  Peripheral IV  Procedures and diagnostic studies:    Dg Chest 2 View 10/27/2015  New small right-sided pleural effusion with underlying opacity.   Ct Abdomen Pelvis W Contrast 10/27/2015 Acute right lower lobe pulmonary embolus with associated right lower lobe atelectasis/ pulmonary infarct. Difficult to exclude pneumonia. Dense left lower lobe collapse/ consolidation as well. Right IJ hemodialysis catheter tip extends into the hepatic IVC, suspect low position related to overall  low chest volume. Nonobstructing nephrolithiasis No acute intra-abdominal or pelvic process.  Ir Removal Tun Cv Cath W/o Fl 10/28/2015  Successful removal of (R)IJ plasmapheresis catheter. No complications.   Medical Consultants:  IR  Other Consultants:  None  IAnti-Infectives:   Vancomycin 2/16 --> 2/18 Maxipime 2/16 -->  Faye Ramsay, MD  [facility] Pager [phone]  If 7PM-7AM, please contact night-coverage [email or web address] Password [facility] 10/29/2015, 4:40 PM   LOS: 2 days   HPI/Subjective: No events overnight. Still with abd pain, nausea.   Objective: Filed Vitals:   10/28/15 2236 10/29/15 0200 10/29/15 0500 10/29/15 1410  BP: 140/82  122/70 158/73  Pulse: 113  102 94  Temp: 102.1 F (38.9 C) 100.5 F (38.1 C) 100 F (37.8 C) 99.5 F (37.5 C)  TempSrc: Oral Oral Oral Oral  Resp: 20  20 20   Height:      Weight:      SpO2: 98%  97% 99%    Intake/Output Summary (Last 24 hours) at 10/29/15 1640 Last data filed at 10/29/15 0741  Gross per 24 hour  Intake 1551.25 ml  Output    350 ml  Net 1201.25 ml    Exam:   General:  Pt is alert, follows commands appropriately, not in acute distress  Cardiovascular: Regular rhythm, tachycardic, S1/S2, no rubs, no gallops  Respiratory: Clear to auscultation bilaterally, no wheezing, no crackles, no rhonchi  Abdomen: Soft, tender in epigastric area, non distended, bowel sounds present, no guarding  Extremities: No edema, pulses DP and PT palpable  bilaterally  Neuro: Grossly nonfocal  Data Reviewed: Basic Metabolic Panel:  Recent Labs Lab 10/27/15 0613 10/29/15 0602  NA 132* 138  K 4.3 3.6  CL 102 111  CO2 21* 19*  GLUCOSE 124* 80  BUN 12 6  CREATININE 0.87 0.62  CALCIUM 9.3 8.4*   Liver Function Tests:  Recent Labs Lab 10/27/15 0613  AST 48*  ALT 38  ALKPHOS 63  BILITOT 1.2  PROT 8.2*  ALBUMIN 3.4*    Recent Labs Lab 10/27/15 0613  LIPASE 23   CBC:  Recent Labs Lab 10/27/15 0613  10/27/15 2035 10/28/15 1050 10/29/15 0602  WBC 13.7* 12.9* 12.8* 8.5  HGB 8.0* 9.8* 7.9* 7.2*  HCT 26.5* 32.6* 25.9* 23.8*  MCV 75.9* 76.2* 75.3* 74.8*  PLT 299 224 263 315   Cardiac Enzymes:  Recent Labs Lab 10/27/15 1255 10/27/15 2035  TROPONINI 0.03 <0.03   Recent Results (from the past 240 hour(s))  Urine culture     Status: None   Collection Time: 10/27/15  8:12 AM  Result Value Ref Range Status   Specimen Description URINE, CATHETERIZED  Final   Special Requests NONE  Final   Culture   Final    NO GROWTH 1 DAY Performed at [facility]    Report Status 10/28/2015 FINAL  Final  MRSA PCR Screening     Status: None   Collection Time: 10/28/15 10:17 AM  Result Value Ref Range Status   MRSA by PCR NEGATIVE NEGATIVE Final     Scheduled Meds: . sodium chloride   Intravenous Once  . azaTHIOprine  50 mg Oral Daily  . ceFEPime (MAXIPIME) IV  1 g Intravenous Q8H  . docusate sodium  100 mg Oral BID  . pantoprazole  40 mg Oral Daily  . predniSONE  5 mg Oral Q breakfast  . pregabalin  50 mg Oral TID  . Rivaroxaban  15 mg Oral BID WC  . sodium chloride flush  3 mL Intravenous Q12H   Continuous Infusions: . sodium chloride 75 mL/hr at 10/28/15 0520

## 2015-10-29 NOTE — Progress Notes (Signed)
Pharmacy Antibiotic Note  Andrea Berry is a 50 y.o. female admitted on 10/27/2015 with possible pneumonia.  Pharmacy has been consulted for vancomycin and cefepime dosing.  Plan:  Vancomycin 1g IV q12h Check trough at steady state Cefepime 1g IV q8h Follow up renal function & cultures De-escalate as appropriate   Height: 5\' 1"  (154.9 cm) Weight: 156 lb (70.761 kg) (patient weighed on Friday) IBW/kg (Calculated) : 47.8  Temp (24hrs), Avg:100.5 F (38.1 C), Min:99.4 F (37.4 C), Max:102.1 F (38.9 C)   Recent Labs Lab 10/27/15 0613 10/27/15 0846 10/27/15 2035 10/28/15 1050 10/29/15 0602  WBC 13.7*  --  12.9* 12.8* 8.5  CREATININE 0.87  --   --   --  0.62  LATICACIDVEN 1.0 1.2  --   --   --     Estimated Creatinine Clearance: 78.2 mL/min (by C-G formula based on Cr of 0.62).    No Known Allergies  Antimicrobials this admission: 2/16 >> Vancomycin >> 2/16 >> Cefepime >>  Dose adjustments this admission: 2/18 2200 vancomycin trough level = ____  Microbiology results: 2/16 BCx: ngtd 2/16 UCx: NGF 2/17 MRSA PCR: negative 2/18 Sputum: (acceptable)    Thank you for allowing pharmacy to be a part of this patient's care.  Gretta Arab PharmD, BCPS Pager [phone] 10/29/2015 9:13 AM

## 2015-10-29 NOTE — Progress Notes (Signed)
Falls for Heparin Indication: pulmonary embolus  No Known Allergies  Patient Measurements: Height: 5\' 1"  (154.9 cm) Weight: 156 lb (70.761 kg) (patient weighed on Friday) IBW/kg (Calculated) : 47.8 Heparin Dosing Weight: 63 kg  Vital Signs: Temp: 100 F (37.8 C) (02/18 0500) Temp Source: Oral (02/18 0500) BP: 122/70 mmHg (02/18 0500) Pulse Rate: 102 (02/18 0500)  Labs:  Recent Labs  10/27/15 0613 10/27/15 0918 10/27/15 1255 10/27/15 2035 10/28/15 1050 10/29/15 0602  HGB 8.0*  --   --  9.8* 7.9* 7.2*  HCT 26.5*  --   --  32.6* 25.9* 23.8*  PLT 299  --   --  224 263 315  APTT  --   --  36  --   --   --   LABPROT  --  16.4*  --   --   --   --   INR  --  1.31  --   --   --   --   HEPARINUNFRC  --   --   --  0.51 0.59 1.07*  CREATININE 0.87  --   --   --   --  0.62  TROPONINI  --   --  0.03 <0.03  --   --     Estimated Creatinine Clearance: 78.2 mL/min (by C-G formula based on Cr of 0.62).   Assessment: 62 yoF presents with abdominal pain. CT found acute right lower lobe pulmonary embolus with associated right lower lobe atelectasis/ pulmonary infarct.  Pharmacy is consulted to dose heparin infusion. RUE venous duplex positive for mobile acute DVT.  Right IJ tunneled hemodialysis catheter removed on 2/17.  Today, 10/29/2015:  Heparin level is above target range on 1200units/hr.  CBC: Hgb cont to trend down. Plts remain WNL.  RN reports no bleeding or complications.  Goal of Therapy:  Heparin level 0.3-0.7 units/ml Monitor platelets by anticoagulation protocol: Yes   Plan:   Decrease heparin infusion to 850units/hr.  Recheck heparin level in 6 hours.  Daily heparin level and CBC.  Continue to monitor H&H, monitor for s/s bleeding or thrombosis.  F/u long-term anticoagulation plan.  Romeo Rabon, PharmD, pager [phone]. 10/29/2015,7:36 AM.

## 2015-10-29 NOTE — Progress Notes (Signed)
CSW confirmed with patient that she plans to return to [facility] when discharged.   Please call weekend CSW (ph#: [phone]) to facilitate discharge if ready over weekend.      Raynaldo Opitz, [facility] Clinical Social Worker cell #: [phone]

## 2015-10-29 NOTE — Evaluation (Signed)
Physical Therapy Evaluation Patient Details Name: Andrea Berry MRN: [id number] DOB: November 16, 1965 Today's Date: 10/29/2015   History of Present Illness  50 yo female admitted with PE, UE DVT. Recent admit in Jan 2017 for L UE weakness, numbness, Devic's disease. Hx: legally blind, HTN, lupus, depressing, CVA 2008. Pt is from SNF  Clinical Impression  On eval, pt required Mod-Min assist +2 for mobility-performed stand pivot x 2, bed<>bsc. Pt reported pain in L UE and L LE during activity. Pt cradled L UE against torso when OOB. After transfers, pt reported she used L PFRW at SNF for gait training. Will follow and progress activity as able during hospital stay. Recommend return to SNF to continue rehab.     Follow Up Recommendations SNF    Equipment Recommendations   (TBD by next venue of care)    Recommendations for Other Services OT consult     Precautions / Restrictions Precautions Precautions: Fall Precaution Comments: Legally blind Restrictions Weight Bearing Restrictions: No      Mobility  Bed Mobility Overal bed mobility: Needs Assistance Bed Mobility: Supine to Sit;Sit to Supine Rolling: Min assist Sidelying to sit: Min assist;+2 for physical assistance;HOB elevated   Sit to supine: Min assist;+2 for physical assistance   General bed mobility comments: Assist for trunk and LEs. Increased time. Cues for use of bedrail with R UE. Pt tends to want to cradle L UE limiting ability to perform task  Transfers Overall transfer level: Needs assistance   Transfers: Sit to/from Stand Sit to Stand: Mod assist Stand pivot transfers: Mod assist;+2 safety/equipment       General transfer comment: Assist to power up, stabilize, weight shift, control descent. Difficulty advaning L LE. Again pt tends to want to cradle L UE due to pain. Stand pivot x2, bed<>bsc.  Ambulation/Gait             General Gait Details: NT  Stairs            Wheelchair Mobility     Modified Rankin (Stroke Patients Only)       Balance Overall balance assessment: Needs assistance         Standing balance support: During functional activity Standing balance-Leahy Scale: Poor                               Pertinent Vitals/Pain Pain Assessment: Faces Faces Pain Scale: Hurts even more Pain Location: L LE, L UE Pain Descriptors / Indicators: Sore;Aching Pain Intervention(s): Monitored during session;Repositioned    Home Living Family/patient expects to be discharged to:: [place]: None      Prior Function Level of Independence: Needs assistance   Gait / Transfers Assistance Needed: working with PT/OT at rehab. walking with PT L platform walker           Hand Dominance        Extremity/Trunk Assessment           LUE Deficits / Details: No functional use of L UE       LLE Deficits / Details: gross LLE weakness, able to perform Long arc quad in sitting, active dorsiflexion/plantarflexion noted.      Communication   Communication: No difficulties  Cognition Arousal/Alertness: Awake/alert Behavior During Therapy: WFL for tasks assessed/performed Overall Cognitive Status: Within Functional Limits for tasks assessed  General Comments      Exercises        Assessment/Plan    PT Assessment Patient needs continued PT services  PT Diagnosis Difficulty walking (L side hemiparesis)   PT Problem List Decreased strength;Decreased activity tolerance;Decreased balance;Decreased mobility;Decreased knowledge of use of DME;Pain  PT Treatment Interventions DME instruction;Gait training;Functional mobility training;Therapeutic activities;Balance training;Therapeutic exercise   PT Goals (Current goals can be found in the Care Plan section) Acute Rehab PT Goals Patient Stated Goal: return to rehab to continue therapy PT Goal Formulation: With  patient Time For Goal Achievement: 11/12/15 Potential to Achieve Goals: Fair    Frequency Min 2X/week   Barriers to discharge        Co-evaluation               End of Session Equipment Utilized During Treatment: Gait belt Activity Tolerance: Patient limited by pain Patient left: in bed;with call bell/phone within reach;with bed alarm set;with family/visitor present           Time: [id number] PT Time Calculation (min) (ACUTE ONLY): 21 min   Charges:   PT Evaluation $PT Eval Moderate Complexity: 1 Procedure     PT G Codes:        Weston Anna, MPT Pager: [phone]

## 2015-10-29 NOTE — Discharge Instructions (Signed)
Information on my medicine - XARELTO (rivaroxaban)  This medication education was reviewed with me or my healthcare representative as part of my discharge preparation.  The pharmacist that spoke with me during my hospital stay was:  [place]? Xarelto was prescribed to treat blood clots that may have been found in the veins of your legs (deep vein thrombosis) or in your lungs (pulmonary embolism) and to reduce the risk of them occurring again.  What do you need to know about Xarelto? The starting dose is one 15 mg tablet taken TWICE daily with food for the FIRST 21 DAYS then on (enter date)  3/11  the dose is changed to one 20 mg tablet taken ONCE A DAY with your evening meal.  DO NOT stop taking Xarelto without talking to the health care provider who prescribed the medication.  Refill your prescription for 20 mg tablets before you run out.  After discharge, you should have regular check-up appointments with your healthcare provider that is prescribing your Xarelto.  In the future your dose may need to be changed if your kidney function changes by a significant amount.  What do you do if you miss a dose? If you are taking Xarelto TWICE DAILY and you miss a dose, take it as soon as you remember. You may take two 15 mg tablets (total 30 mg) at the same time then resume your regularly scheduled 15 mg twice daily the next day.  If you are taking Xarelto ONCE DAILY and you miss a dose, take it as soon as you remember on the same day then continue your regularly scheduled once daily regimen the next day. Do not take two doses of Xarelto at the same time.   Important Safety Information Xarelto is a blood thinner medicine that can cause bleeding. You should call your healthcare provider right away if you experience any of the following: ? Bleeding from an injury or your nose that does not stop. ? Unusual colored urine (red or dark brown) or unusual colored  stools (red or black). ? Unusual bruising for unknown reasons. ? A serious fall or if you hit your head (even if there is no bleeding).  Some medicines may interact with Xarelto and might increase your risk of bleeding while on Xarelto. To help avoid this, consult your healthcare provider or pharmacist prior to using any new prescription or non-prescription medications, including herbals, vitamins, non-steroidal anti-inflammatory drugs (NSAIDs) and supplements.  This website has more information on Xarelto: [email or web address]

## 2015-10-29 NOTE — NC FL2 (Signed)
[place] LEVEL OF CARE SCREENING TOOL     IDENTIFICATION  Patient Name: Andrea Berry Birthdate: [birth year] Sex: female Admission Date (Current Location): 10/27/2015  [facility] and [place] Number:  Herbalist and Address:  [facility],  [place] [address], [place]      Provider Number: [id number]  Attending Physician Name and Address:  Theodis Blaze, MD  Relative Name and Phone Number:       Current Level of Care: Hospital Recommended Level of Care: [place] Prior Approval Number:    Date Approved/Denied:   PASRR Number: [id number]  Discharge Plan: SNF    Current Diagnoses: Patient Active Problem List   Diagnosis Date Noted  . Pulmonary embolism with infarction ([place]) 10/28/2015  . Sepsis due to pneumonia ([place]) 10/28/2015  . Acute epigastric pain 10/28/2015  . Anemia of chronic disease 10/28/2015  . HCAP (healthcare-associated pneumonia) 10/27/2015  . Devic's disease ([place])   . SLE (systemic lupus erythematosus) ([place]) 09/21/2015    Orientation RESPIRATION BLADDER Height & Weight     Self, Time, Situation, Place    Continent Weight: 156 lb (70.761 kg) (patient weighed on Friday) Height:  5\' 1"  (154.9 cm)  BEHAVIORAL SYMPTOMS/MOOD NEUROLOGICAL BOWEL NUTRITION STATUS      Continent Diet  AMBULATORY STATUS COMMUNICATION OF NEEDS Skin   Limited Assist Verbally                         Personal Care Assistance Level of Assistance  Bathing, Dressing           Functional Limitations Info  Sight          SPECIAL CARE FACTORS FREQUENCY  PT (By licensed PT), OT (By licensed OT)                    Contractures      Additional Factors Info  Code Status, Allergies Code Status Info: Fullcode Allergies Info: NKDA           Current Medications (10/29/2015):  This is the current hospital active medication list Current Facility-Administered Medications  Medication Dose Route Frequency  Provider Last Rate Last Dose  . 0.9 %  sodium chloride infusion   Intravenous Continuous Willia Craze, NP 75 mL/hr at 10/28/15 0520    . 0.9 %  sodium chloride infusion   Intravenous Once Willia Craze, NP   Stopped at 10/27/15 1325  . acetaminophen (TYLENOL) tablet 650 mg  650 mg Oral Q6H PRN Willia Craze, NP       Or  . acetaminophen (TYLENOL) suppository 650 mg  650 mg Rectal Q6H PRN Willia Craze, NP   650 mg at 10/28/15 2359  . azaTHIOprine (IMURAN) tablet 50 mg  50 mg Oral Daily Willia Craze, NP   50 mg at 10/29/15 1000  . ceFEPIme (MAXIPIME) 1 g in dextrose 5 % 50 mL IVPB  1 g Intravenous Q8H Emiliano Dyer, RPH   1 g at 10/29/15 1000  . docusate sodium (COLACE) capsule 100 mg  100 mg Oral BID Willia Craze, NP   100 mg at 10/29/15 0959  . HYDROcodone-acetaminophen (NORCO/VICODIN) 5-325 MG per tablet 1-2 tablet  1-2 tablet Oral Q4H PRN Willia Craze, NP   2 tablet at 10/28/15 0750  . HYDROmorphone (DILAUDID) injection 1 mg  1 mg Intravenous Q2H PRN Theodis Blaze, MD   1 mg at  10/29/15 1612  . iohexol (OMNIPAQUE) 300 MG/ML solution 50 mL  50 mL Oral Once PRN Orpah Greek, MD   50 mL at 10/27/15 0730  . LORazepam (ATIVAN) tablet 0.5 mg  0.5 mg Oral Q4H PRN Theodis Blaze, MD   0.5 mg at 10/28/15 2230  . ondansetron (ZOFRAN) tablet 4 mg  4 mg Oral Q6H PRN Willia Craze, NP       Or  . ondansetron [facility]) injection 4 mg  4 mg Intravenous Q6H PRN Willia Craze, NP   4 mg at 10/28/15 2218  . pantoprazole (PROTONIX) EC tablet 40 mg  40 mg Oral Daily Willia Craze, NP   40 mg at 10/29/15 0959  . polyethylene glycol (MIRALAX / GLYCOLAX) packet 17 g  17 g Oral Daily PRN Willia Craze, NP      . predniSONE (DELTASONE) tablet 5 mg  5 mg Oral Q breakfast Willia Craze, NP   5 mg at 10/29/15 0824  . pregabalin (LYRICA) capsule 50 mg  50 mg Oral TID Willia Craze, NP   50 mg at 10/29/15 1612  . Rivaroxaban (XARELTO) tablet 15 mg  15 mg Oral BID WC  Christine E Shade, RPH      . sodium chloride flush (NS) 0.9 % injection 3 mL  3 mL Intravenous Q12H Willia Craze, NP   3 mL at 10/28/15 2219     Discharge Medications: Please see discharge summary for a list of discharge medications.  Relevant Imaging Results:  Relevant Lab Results:   Additional Information SSN: [id number]  Standley Brooking, LCSW

## 2015-10-30 LAB — BASIC METABOLIC PANEL
Anion gap: 9 (ref 5–15)
CALCIUM: 8.7 mg/dL — AB (ref 8.9–10.3)
CO2: 20 mmol/L — ABNORMAL LOW (ref 22–32)
CREATININE: 0.55 mg/dL (ref 0.44–1.00)
Chloride: 108 mmol/L (ref 101–111)
GFR calc Af Amer: >60 mL/min (ref >60)
Glucose, Bld: 87 mg/dL (ref 65–99)
POTASSIUM: 3.3 mmol/L — AB (ref 3.5–5.1)
SODIUM: 137 mmol/L (ref 135–145)

## 2015-10-30 LAB — CBC
HCT: 22.2 % — ABNORMAL LOW (ref 36.0–46.0)
Hemoglobin: 7 g/dL — ABNORMAL LOW (ref 12.0–15.0)
MCH: 23.2 pg — AB (ref 26.0–34.0)
MCHC: 31.5 g/dL (ref 30.0–36.0)
MCV: 73.5 fL — ABNORMAL LOW (ref 78.0–100.0)
PLATELETS: 307 10*3/uL (ref 150–400)
RBC: 3.02 MIL/uL — AB (ref 3.87–5.11)
RDW: 15.5 % (ref 11.5–15.5)
WBC: 5.3 10*3/uL (ref 4.0–10.5)

## 2015-10-30 LAB — STREP PNEUMONIAE URINARY ANTIGEN: Strep Pneumo Urinary Antigen: NEGATIVE

## 2015-10-30 LAB — PREPARE RBC (CROSSMATCH)

## 2015-10-30 MED ORDER — POTASSIUM CHLORIDE CRYS ER 20 MEQ PO TBCR
40.0000 meq | EXTENDED_RELEASE_TABLET | Freq: Once | ORAL | Status: AC
  Administered 2015-10-30: 40 meq via ORAL
  Filled 2015-10-30: qty 2

## 2015-10-30 MED ORDER — SODIUM CHLORIDE 0.9 % IV SOLN: Freq: Once | INTRAVENOUS | Status: DC

## 2015-10-30 NOTE — Progress Notes (Signed)
Patient ID: Andrea Berry, female   DOB: [birth year], 50 y.o.   MRN: [id number]  [facility] PROGRESS NOTE  Andrea Berry [id number] DOB: September 01, 1966 DOA: 10/27/2015 PCP: Minette Brine   Brief narrative:    50 y.o. female with HTN, GERD, remote CVA. and SLE. on chronic immunosuppressants, presented with diffuse, constant abdominal pain which is exacerbated by movement. Abd imaging studies done in ED and notable for PE. Pt started on heparin drip and [facility] asked to admit for further evaluation.   Assessment/Plan:    Principal Problem:   Pulmonary embolism with infarction ([place]) - RUE positive for mobile acute deep vein thrombosis involving the right internal jugular vein - transitioned to Xarelto per pharmacy  - removed IJ cath by IR team on admission - HG is down since starting Xarelto, close monitoring   Active Problems:   Sepsis due to pneumonia, HCAP ([place]) - continue maxipime day #4, vanc stopped after day #3 - monitor clinical progress - possibly transition to oral ABX In AM    Acute epigastric pain - possibly related to the acute illness - still with pain but better since yesterday, no new findings on CT abd  - analgesia as needed     SLE (systemic lupus erythematosus) ([place]) - continue home medical regimen     Devic's disease (HCC)    Anemia of chronic disease - drop in Hg, close monitoring, transfuse 2 U PRBC today - CBC in AM  DVT prophylaxis - Xarelto per pharmacy   Code Status: Full.  Family Communication:  plan of care discussed with the patient Disposition Plan: SNF by 2/20   IV access:  Peripheral IV  Procedures and diagnostic studies:    Dg Chest 2 View 10/27/2015  New small right-sided pleural effusion with underlying opacity.   Ct Abdomen Pelvis W Contrast 10/27/2015 Acute right lower lobe pulmonary embolus with associated right lower lobe atelectasis/ pulmonary infarct. Difficult to exclude pneumonia. Dense left lower lobe collapse/ consolidation  as well. Right IJ hemodialysis catheter tip extends into the hepatic IVC, suspect low position related to overall low chest volume. Nonobstructing nephrolithiasis No acute intra-abdominal or pelvic process.  Ir Removal Tun Cv Cath W/o Fl 10/28/2015  Successful removal of (R)IJ plasmapheresis catheter. No complications.   Medical Consultants:  IR  Other Consultants:  None  IAnti-Infectives:   Vancomycin 2/16 --> 2/18 Maxipime 2/16 -->  Faye Ramsay, MD  [facility] Pager [phone]  If 7PM-7AM, please contact night-coverage [email or web address] Password [facility] 10/30/2015, 4:48 PM   LOS: 3 days   HPI/Subjective: No events overnight. Still with abd pain but better since yesterday   Objective: Filed Vitals:   10/29/15 1410 10/29/15 2018 10/30/15 0516 10/30/15 1409  BP: 158/73 140/81 152/86 121/62  Pulse: 94 91 99 92  Temp: 99.5 F (37.5 C) 98.4 F (36.9 C) 99.1 F (37.3 C) 98.5 F (36.9 C)  TempSrc: Oral Oral Oral Oral  Resp: 20 20 18 18   Height:      Weight:      SpO2: 99% 95% 96% 96%    Intake/Output Summary (Last 24 hours) at 10/30/15 1648 Last data filed at 10/30/15 1527  Gross per 24 hour  Intake    150 ml  Output    950 ml  Net   -800 ml    Exam:   General:  Pt is alert, follows commands appropriately, not in acute distress  Cardiovascular: Regular rhythm, tachycardic, S1/S2, no rubs, no gallops  Respiratory: Clear to auscultation bilaterally,  no wheezing, no crackles, no rhonchi  Abdomen: Soft, tender in epigastric area, non distended, bowel sounds present, no guarding  Extremities: No edema, pulses DP and PT palpable bilaterally  Neuro: Grossly nonfocal  Data Reviewed: Basic Metabolic Panel:  Recent Labs Lab 10/27/15 0613 10/29/15 0602 10/30/15 0604  NA 132* 138 137  K 4.3 3.6 3.3*  CL 102 111 108  CO2 21* 19* 20*  GLUCOSE 124* 80 87  BUN 12 6 <5*  CREATININE 0.87 0.62 0.55  CALCIUM 9.3 8.4* 8.7*   Liver Function Tests:  Recent Labs Lab  10/27/15 0613  AST 48*  ALT 38  ALKPHOS 63  BILITOT 1.2  PROT 8.2*  ALBUMIN 3.4*    Recent Labs Lab 10/27/15 0613  LIPASE 23   CBC:  Recent Labs Lab 10/27/15 0613 10/27/15 2035 10/28/15 1050 10/29/15 0602 10/30/15 0604  WBC 13.7* 12.9* 12.8* 8.5 5.3  HGB 8.0* 9.8* 7.9* 7.2* 7.0*  HCT 26.5* 32.6* 25.9* 23.8* 22.2*  MCV 75.9* 76.2* 75.3* 74.8* 73.5*  PLT 299 224 263 315 307   Cardiac Enzymes:  Recent Labs Lab 10/27/15 1255 10/27/15 2035  TROPONINI 0.03 <0.03   Recent Results (from the past 240 hour(s))  Urine culture     Status: None   Collection Time: 10/27/15  8:12 AM  Result Value Ref Range Status   Specimen Description URINE, CATHETERIZED  Final   Special Requests NONE  Final   Culture   Final    NO GROWTH 1 DAY Performed at [facility]    Report Status 10/28/2015 FINAL  Final  MRSA PCR Screening     Status: None   Collection Time: 10/28/15 10:17 AM  Result Value Ref Range Status   MRSA by PCR NEGATIVE NEGATIVE Final     Scheduled Meds: . sodium chloride   Intravenous Once  . azaTHIOprine  50 mg Oral Daily  . ceFEPime (MAXIPIME) IV  1 g Intravenous Q8H  . docusate sodium  100 mg Oral BID  . pantoprazole  40 mg Oral Daily  . predniSONE  5 mg Oral Q breakfast  . pregabalin  50 mg Oral TID  . Rivaroxaban  15 mg Oral BID WC  . sodium chloride flush  3 mL Intravenous Q12H   Continuous Infusions:

## 2015-10-31 LAB — BASIC METABOLIC PANEL
Anion gap: 9 (ref 5–15)
CHLORIDE: 107 mmol/L (ref 101–111)
CO2: 22 mmol/L (ref 22–32)
CREATININE: 0.57 mg/dL (ref 0.44–1.00)
Calcium: 9.1 mg/dL (ref 8.9–10.3)
GFR calc Af Amer: >60 mL/min (ref >60)
GFR calc non Af Amer: >60 mL/min (ref >60)
GLUCOSE: 91 mg/dL (ref 65–99)
Potassium: 3.5 mmol/L (ref 3.5–5.1)
SODIUM: 138 mmol/L (ref 135–145)

## 2015-10-31 LAB — CBC
HCT: 33.5 % — ABNORMAL LOW (ref 36.0–46.0)
HEMOGLOBIN: 10.4 g/dL — AB (ref 12.0–15.0)
MCH: 23.8 pg — AB (ref 26.0–34.0)
MCHC: 31 g/dL (ref 30.0–36.0)
MCV: 76.7 fL — AB (ref 78.0–100.0)
Platelets: 397 10*3/uL (ref 150–400)
RBC: 4.37 MIL/uL (ref 3.87–5.11)
RDW: 16.4 % — ABNORMAL HIGH (ref 11.5–15.5)
WBC: 5.7 10*3/uL (ref 4.0–10.5)

## 2015-10-31 LAB — LEGIONELLA ANTIGEN, URINE

## 2015-10-31 NOTE — Progress Notes (Signed)
CSW continuing to follow.   Pt admitted from [facility] and plans to return when medically ready.  Per MD, pt not yet medically ready for discharge.   CSW updated [employer]  No further social work needs identified at this time.  CSW signing off.   Alison Murray, MSW, [place] [facility] [phone]

## 2015-10-31 NOTE — Care Management Important Message (Signed)
Important Message  Patient Details  Name: Andrea Berry MRN: [id number] Date of Birth: 08-Jan-1966   Medicare Important Message Given:  Yes    Shire, Veltri 10/31/2015, 2:56 [place] Message  Patient Details  Name: Andrea Berry MRN: [id number] Date of Birth: 12/28/1965   Medicare Important Message Given:  Yes    Latasha, Griff 10/31/2015, 2:56 PM

## 2015-10-31 NOTE — Progress Notes (Signed)
Patient ID: Andrea Berry, female   DOB: 12-31-1965, 50 y.o.   MRN: [id number]  [facility] PROGRESS NOTE  Andrea Berry [id number] DOB: 1966/07/15 DOA: 10/27/2015 PCP: Minette Brine   Brief narrative:    50 y.o. female with HTN, GERD, remote CVA. and SLE. on chronic immunosuppressants, presented with diffuse, constant abdominal pain which is exacerbated by movement. Abd imaging studies done in ED and notable for PE. Pt started on heparin drip and [facility] asked to admit for further evaluation.   Assessment/Plan:    Principal Problem:   Pulmonary embolism with infarction ([place]) - RUE positive for mobile acute deep vein thrombosis involving the right internal jugular vein - transitioned to Xarelto per pharmacy  - removed IJ cath by IR team on admission - hg stable post transfusion 2 U PRBC 2/19, appropriate increase in hg   Active Problems:   Sepsis due to pneumonia, HCAP ([place]) - continue maxipime day #5, vanc stopped after day #3 - possibly transition to oral ABX In AM    Acute epigastric pain - possibly related to the acute illness - no new findings on CT abd, pt says pain is very minimal and almost resolved  - analgesia as needed  - suspect component of anxiety also contributing, pt seems fine but as soon as we walk into the room appears to be in pain - nursing staff also noted lots of anxiety     SLE (systemic lupus erythematosus) ([place]) - continue home medical regimen     Devic's disease ([place])    Anemia of chronic disease - drop in Hg, close monitoring, transfuse 2 U PRBC today - CBC in AM  DVT prophylaxis - Xarelto per pharmacy   Code Status: Full.  Family Communication:  plan of care discussed with the patient Disposition Plan: SNF by 2/22   IV access:  Peripheral IV  Procedures and diagnostic studies:    Dg Chest 2 View 10/27/2015  New small right-sided pleural effusion with underlying opacity.   Ct Abdomen Pelvis W Contrast 10/27/2015 Acute right lower  lobe pulmonary embolus with associated right lower lobe atelectasis/ pulmonary infarct. Difficult to exclude pneumonia. Dense left lower lobe collapse/ consolidation as well. Right IJ hemodialysis catheter tip extends into the hepatic IVC, suspect low position related to overall low chest volume. Nonobstructing nephrolithiasis No acute intra-abdominal or pelvic process.  Ir Removal Tun Cv Cath W/o Fl 10/28/2015  Successful removal of (R)IJ plasmapheresis catheter. No complications.   Medical Consultants:  IR  Other Consultants:  None  IAnti-Infectives:   Vancomycin 2/16 --> 2/18 Maxipime 2/16 -->  Faye Ramsay, MD  [facility] Pager [phone]  If 7PM-7AM, please contact night-coverage [email or web address] Password [facility] 10/31/2015, 10:55 AM   LOS: 4 days   HPI/Subjective: No events overnight. Still with abd pain but much better, 2/10 in severity.   Objective: Filed Vitals:   10/31/15 0038 10/31/15 0205 10/31/15 0240 10/31/15 0515  BP: 156/82 151/95 155/82 156/81  Pulse: 80 101 77 78  Temp: 98.6 F (37 C) 98.6 F (37 C) 99.2 F (37.3 C) 99.3 F (37.4 C)  TempSrc: Oral Oral Oral Oral  Resp: 16 16 16 16   Height:      Weight:      SpO2: 98% 100% 97% 96%    Intake/Output Summary (Last 24 hours) at 10/31/15 1055 Last data filed at 10/31/15 0600  Gross per 24 hour  Intake    775 ml  Output   1600 ml  Net   -  825 ml    Exam:   General:  Pt is alert, follows commands appropriately, not in acute distress  Cardiovascular: Regular rhythm, S1/S2, no rubs, no gallops  Respiratory: Clear to auscultation bilaterally, no wheezing, no crackles, no rhonchi  Abdomen: Soft, non tender, non distended, bowel sounds present, no guarding  Extremities: No edema, pulses DP and PT palpable bilaterally  Neuro: Grossly nonfocal  Data Reviewed: Basic Metabolic Panel:  Recent Labs Lab 10/27/15 0613 10/29/15 0602 10/30/15 0604 10/31/15 0753  NA 132* 138 137 138  K 4.3 3.6 3.3* 3.5   CL 102 111 108 107  CO2 21* 19* 20* 22  GLUCOSE 124* 80 87 91  BUN 12 6 <5* <5*  CREATININE 0.87 0.62 0.55 0.57  CALCIUM 9.3 8.4* 8.7* 9.1   Liver Function Tests:  Recent Labs Lab 10/27/15 0613  AST 48*  ALT 38  ALKPHOS 63  BILITOT 1.2  PROT 8.2*  ALBUMIN 3.4*    Recent Labs Lab 10/27/15 0613  LIPASE 23   CBC:  Recent Labs Lab 10/27/15 2035 10/28/15 1050 10/29/15 0602 10/30/15 0604 10/31/15 0753  WBC 12.9* 12.8* 8.5 5.3 5.7  HGB 9.8* 7.9* 7.2* 7.0* 10.4*  HCT 32.6* 25.9* 23.8* 22.2* 33.5*  MCV 76.2* 75.3* 74.8* 73.5* 76.7*  PLT 224 263 315 307 397   Cardiac Enzymes:  Recent Labs Lab 10/27/15 1255 10/27/15 2035  TROPONINI 0.03 <0.03   Recent Results (from the past 240 hour(s))  Urine culture     Status: None   Collection Time: 10/27/15  8:12 AM  Result Value Ref Range Status   Specimen Description URINE, CATHETERIZED  Final   Special Requests NONE  Final   Culture   Final    NO GROWTH 1 DAY Performed at [facility]    Report Status 10/28/2015 FINAL  Final  MRSA PCR Screening     Status: None   Collection Time: 10/28/15 10:17 AM  Result Value Ref Range Status   MRSA by PCR NEGATIVE NEGATIVE Final     Scheduled Meds: . sodium chloride   Intravenous Once  . sodium chloride   Intravenous Once  . azaTHIOprine  50 mg Oral Daily  . ceFEPime (MAXIPIME) IV  1 g Intravenous Q8H  . docusate sodium  100 mg Oral BID  . pantoprazole  40 mg Oral Daily  . predniSONE  5 mg Oral Q breakfast  . pregabalin  50 mg Oral TID  . Rivaroxaban  15 mg Oral BID WC  . sodium chloride flush  3 mL Intravenous Q12H

## 2015-11-01 DIAGNOSIS — R072 Precordial pain: Secondary | ICD-10-CM

## 2015-11-01 LAB — CBC
HEMATOCRIT: 35.4 % — AB (ref 36.0–46.0)
HEMOGLOBIN: 10.9 g/dL — AB (ref 12.0–15.0)
MCH: 23.9 pg — ABNORMAL LOW (ref 26.0–34.0)
MCHC: 30.8 g/dL (ref 30.0–36.0)
MCV: 77.5 fL — ABNORMAL LOW (ref 78.0–100.0)
PLATELETS: 422 10*3/uL — AB (ref 150–400)
RBC: 4.57 MIL/uL (ref 3.87–5.11)
RDW: 16.9 % — AB (ref 11.5–15.5)
WBC: 5.8 10*3/uL (ref 4.0–10.5)

## 2015-11-01 LAB — CULTURE, RESPIRATORY

## 2015-11-01 LAB — TYPE AND SCREEN
ABO/RH(D): A POS
ANTIBODY SCREEN: NEGATIVE
UNIT DIVISION: 0
UNIT DIVISION: 0

## 2015-11-01 LAB — BASIC METABOLIC PANEL
ANION GAP: 10 (ref 5–15)
BUN: 7 mg/dL (ref 6–20)
CALCIUM: 9.6 mg/dL (ref 8.9–10.3)
CO2: 22 mmol/L (ref 22–32)
CREATININE: 0.64 mg/dL (ref 0.44–1.00)
Chloride: 105 mmol/L (ref 101–111)
GFR calc Af Amer: >60 mL/min (ref >60)
GLUCOSE: 90 mg/dL (ref 65–99)
Potassium: 3.5 mmol/L (ref 3.5–5.1)
Sodium: 137 mmol/L (ref 135–145)

## 2015-11-01 LAB — CULTURE, RESPIRATORY W GRAM STAIN

## 2015-11-01 LAB — CULTURE, BLOOD (ROUTINE X 2)
CULTURE: NO GROWTH
CULTURE: NO GROWTH

## 2015-11-01 MED ORDER — HYDROCODONE-ACETAMINOPHEN 5-325 MG PO TABS: 1.0000 | ORAL_TABLET | ORAL | Status: DC | PRN

## 2015-11-01 MED ORDER — LEVOFLOXACIN 750 MG PO TABS: 750.0000 mg | ORAL_TABLET | Freq: Every day | ORAL | Status: DC

## 2015-11-01 MED ORDER — RIVAROXABAN (XARELTO) VTE STARTER PACK (15 & 20 MG): ORAL_TABLET | ORAL | Status: DC

## 2015-11-01 MED ORDER — LORAZEPAM 0.5 MG PO TABS: 0.5000 mg | ORAL_TABLET | ORAL | Status: DC | PRN

## 2015-11-01 NOTE — Progress Notes (Signed)
Pharmacy Antibiotic/Anticoagulation Note  Andrea Berry is a 50 y.o. female admitted on 10/27/2015 with possible pneumonia.  Pharmacy has been consulted for cefepime dosing.  Pharmacy is also following patient for Xarelto dosing for acute PE + DVT.  Plan: No dose adjustments to Cefepime 1g IV q8h.  Consider narrowing therapy.  Could transition to Levaquin due to concern with Pseudomonas risk factors (recent admission, SNF resident).  Today is day #6 of antibiotics.  Would treat for total 8 day course. No dose adjustments to Xarelto.  Continue Xarelto 15mg  BID x 21 days then 20mg  daily starting 3/11.  Patient has been educated.  Height: 5\' 1"  (154.9 cm) Weight: 156 lb (70.761 kg) (patient weighed on Friday) IBW/kg (Calculated) : 47.8  Temp (24hrs), Avg:98.5 F (36.9 C), Min:98.3 F (36.8 C), Max:98.7 F (37.1 C)   Recent Labs Lab 10/27/15 0613 10/27/15 0846  10/28/15 1050 10/29/15 0602 10/30/15 0604 10/31/15 0753 11/01/15 0538  WBC 13.7*  --   < > 12.8* 8.5 5.3 5.7 5.8  CREATININE 0.87  --   --   --  0.62 0.55 0.57 0.64  LATICACIDVEN 1.0 1.2  --   --   --   --   --   --   < > = values in this interval not displayed.  Estimated Creatinine Clearance: 78.2 mL/min (by C-G formula based on Cr of 0.64).    No Known Allergies  Antimicrobials this admission: 2/16 >> Vancomycin >> 2/18 2/16 >> Cefepime >>  Dose adjustments this admission: -  Microbiology results: 2/16 BCx: ngtd x 4 days 2/16 UCx: NGF 2/17 MRSA PCR: negative 2/18 Sputum: few Candida albicans  2/18 Strep pneumo Ur Ag: negative    Thank you for allowing pharmacy to be a part of this patient's care.  Hershal Coria, PharmD, BCPS Pager: [phone] 11/01/2015 10:12 AM

## 2015-11-01 NOTE — Progress Notes (Signed)
Physical Therapy Treatment Patient Details Name: Andrea Berry MRN: [id number] DOB: 1966-03-28 Today's Date: 2015-11-17    History of Present Illness 50 yo female admitted with PE, UE DVT. Recent admit in Jan 2017 for L UE weakness, numbness, Devic's disease. Hx: legally blind, HTN, lupus, depressing, CVA 2008. Pt is from SNF    PT Comments    Pt declined OOB activity today however encouraged to sit EOB for dinner (states she does not have an appetite).  Pt was agreeable to exercises so performed most LE exercises with a few UE as well.  Follow Up Recommendations  SNF     Equipment Recommendations  Other (comment) (TBD by next venue)    Recommendations for Other Services       Precautions / Restrictions Precautions Precautions: Fall Precaution Comments: Legally blind    Mobility  Bed Mobility                  Transfers                    Ambulation/Gait                 Stairs            Wheelchair Mobility    Modified Rankin (Stroke Patients Only)       Balance                                    Cognition Arousal/Alertness: Awake/alert Behavior During Therapy: Flat affect Overall Cognitive Status: Within Functional Limits for tasks assessed                      Exercises General Exercises - Upper Extremity Shoulder Flexion: AAROM;Right;10 reps;Seated Elbow Flexion: AROM;Right;10 reps Elbow Extension: AROM;Right;10 reps General Exercises - Lower Extremity Ankle Circles/Pumps: AROM;Both;10 reps Quad Sets: AROM;Both;10 reps Short Arc Quad: AROM;Both;10 reps Heel Slides: AROM;Both;10 reps Hip ABduction/ADduction: AROM;Both;10 reps Hand Exercises Digit Composite Flexion: AROM;Both;10 reps;Limitations Composite Flexion Limitations: L limited actively due to pain Composite Extension: AROM;Both;10 reps Composite Extension Limitations: L limited actively due to pain    General Comments         Pertinent Vitals/Pain Pain Assessment: Faces Faces Pain Scale: Hurts even more Pain Location: L UE and LE Pain Descriptors / Indicators: Sore;Aching Pain Intervention(s): Limited activity within patient's tolerance;Monitored during session;Repositioned    Home Living                      Prior Function            PT Goals (current goals can now be found in the care plan section) Progress towards PT goals: Progressing toward goals    Frequency  Min 2X/week    PT Plan Current plan remains appropriate    Co-evaluation             End of Session   Activity Tolerance: Patient limited by pain Patient left: in bed;with call bell/phone within reach;with family/visitor present     Time: 1422-1440 PT Time Calculation (min) (ACUTE ONLY): 18 min  Charges:  $Therapeutic Exercise: 8-22 mins                    G Codes:      Mana Haberl,KATHrine E 11-17-2015, 2:50 PM Carmelia Bake, PT, DPT 2015/11/17 Pager: [phone]

## 2015-11-01 NOTE — Discharge Summary (Addendum)
Physician Discharge Summary  Andrea Berry [id number] DOB: 28-May-1966 DOA: 10/27/2015  PCP: Minette Brine  Admit date: 10/27/2015 Discharge date: 11/01/2015  Recommendations for Outpatient Follow-up:  1. Pt will need to follow up with PCP in 2-3 weeks post discharge 2. Please obtain BMP to evaluate electrolytes and kidney function 3. Please also check CBC to evaluate Hg and Hct levels, Plt level  4. Pt discharge on Xarelto for new diagnosis of PE, needs repeat CT chest in 4-6 weeks to ensure resolving PE 5. Pt also to complete therapy with Levaquin for HCAP   Discharge Diagnoses:  Principal Problem:   Pulmonary embolism with infarction [facility]) Active Problems:   HCAP (healthcare-associated pneumonia)   Sepsis due to pneumonia ([place])   Acute epigastric pain   SLE (systemic lupus erythematosus) ([place])   Devic's disease ([place])   Anemia of chronic disease  Discharge Condition: Stable  Diet recommendation: Heart healthy diet discussed in details   Brief narrative:    50 y.o. female with HTN, GERD, remote CVA. and SLE. on chronic immunosuppressants, presented with diffuse, constant abdominal pain which is exacerbated by movement. Abd imaging studies done in ED and notable for PE. Pt started on heparin drip and TRH asked to admit for further evaluation.   Assessment/Plan:    Principal Problem:  Pulmonary embolism with infarction ([place]) - RUE positive for mobile acute deep vein thrombosis involving the right internal jugular vein - transitioned to Xarelto - removed IJ cath by IR team on admission - hg stable post transfusion 2 U PRBC 2/19, appropriate increase in hg   Active Problems:  Sepsis due to pneumonia, HCAP ([place]) present on admission  - transitioned to oral Levaquin on discharge to complete therapy   Acute epigastric pain - possibly related to the acute illness - no new findings on CT abd, pt says pain is very minimal and almost resolved  - analgesia as needed   - suspect component of anxiety also contributing, pt seems fine but as soon as we walk into the room appears to be in pain - nursing staff also noted lots of anxiety    SLE (systemic lupus erythematosus) ([place]) - continue home medical regimen    Devic's disease ([place])   Anemia of chronic disease - drop in Hg, close monitoring, transfused 2 U PRBC with appropriate increase in post transfusion Hg    Thrombocytosis - reactive - monitor   Code Status: Full.  Family Communication: plan of care discussed with the patient Disposition Plan: SNF by 2/22  IV access:  Peripheral IV  Procedures and diagnostic studies:   Dg Chest 2 View 10/27/2015 New small right-sided pleural effusion with underlying opacity.   Ct Abdomen Pelvis W Contrast 10/27/2015 Acute right lower lobe pulmonary embolus with associated right lower lobe atelectasis/ pulmonary infarct. Difficult to exclude pneumonia. Dense left lower lobe collapse/ consolidation as well. Right IJ hemodialysis catheter tip extends into the hepatic IVC, suspect low position related to overall low chest volume. Nonobstructing nephrolithiasis No acute intra-abdominal or pelvic process.  Ir Removal Tun Cv Cath W/o Fl 10/28/2015 Successful removal of (R)IJ plasmapheresis catheter. No complications.   Medical Consultants:  IR  Other Consultants:  None  IAnti-Infectives:   Vancomycin 2/16 --> 2/18 Maxipime 2/16 --> transition to Levaquin on discharge       Discharge Exam: Filed Vitals:   10/31/15 2156 11/01/15 0536  BP: 113/68 155/93  Pulse: 97 84  Temp: 98.7 F (37.1 C) 98.3 F (36.8 C)  Resp: 18 18   Filed Vitals:   10/31/15 0515 10/31/15 1442 10/31/15 2156 11/01/15 0536  BP: 156/81 112/58 113/68 155/93  Pulse: 78 97 97 84  Temp: 99.3 F (37.4 C) 98.6 F (37 C) 98.7 F (37.1 C) 98.3 F (36.8 C)  TempSrc: Oral Oral Oral Oral  Resp: 16 16 18 18   Height:      Weight:      SpO2: 96% 98% 97% 97%     General: Pt is alert, follows commands appropriately, not in acute distress Cardiovascular: Regular rate and rhythm, no rubs, no gallops Respiratory: Clear to auscultation bilaterally, no wheezing, diminished breath sounds at bases  Abdominal: Soft, non tender, non distended, bowel sounds +, no guarding Extremities: no edema, no cyanosis, pulses palpable bilaterally DP and PT Neuro: Grossly nonfocal  Discharge Instructions     Medication List    TAKE these medications        azaTHIOprine 50 MG tablet  Commonly known as:  IMURAN  Take 50 mg by mouth daily.     HYDROmorphone 2 MG tablet  Commonly known as:  DILAUDID  Take 1 tablet (2 mg total) by mouth every 4 (four) hours as needed for severe pain.     ibandronate 150 MG tablet  Commonly known as:  BONIVA  Take 150 mg by mouth every 30 (thirty) days.     levofloxacin 750 MG tablet  Commonly known as:  LEVAQUIN  Take 1 tablet (750 mg total) by mouth daily.     LORazepam 0.5 MG tablet  Commonly known as:  ATIVAN  Take 1 tablet (0.5 mg total) by mouth every 4 (four) hours as needed for anxiety.     multivitamin capsule  Take 1 capsule by mouth daily.     omeprazole 20 MG capsule  Commonly known as:  PRILOSEC  Take 20 mg by mouth daily.     predniSONE 5 MG tablet  Commonly known as:  DELTASONE  Take 5 mg by mouth daily with breakfast.     pregabalin 50 MG capsule  Commonly known as:  LYRICA  Take 50 mg by mouth 3 (three) times daily.     Rivaroxaban 15 & 20 MG Tbpk  Commonly known as:  XARELTO STARTER PACK  Take as directed on package: Start with one 15mg  tablet by mouth twice a day with food. On Day 22, switch to one 20mg  tablet once a day with food.            Follow-up Information    Follow up with Minette Brine.   Specialty:  General Practice   Contact information:   [address] [place] [address] [phone]        The results of significant diagnostics from this  hospitalization (including imaging, microbiology, ancillary and laboratory) are listed below for reference.     Microbiology: Recent Results (from the past 240 hour(s))  Urine culture     Status: None   Collection Time: 10/27/15  8:12 AM  Result Value Ref Range Status   Specimen Description URINE, CATHETERIZED  Final   Special Requests NONE  Final   Culture   Final    NO GROWTH 1 DAY Performed at [facility]    Report Status 10/28/2015 FINAL  Final  Culture, blood (routine x 2)     Status: None   Collection Time: 10/27/15  8:46 AM  Result Value Ref Range Status   Specimen Description BLOOD RIGHT HAND  Final  Special Requests IN PEDIATRIC BOTTLE 1ML  Final   Culture   Final    NO GROWTH 5 DAYS Performed at [facility]    Report Status 11/01/2015 FINAL  Final  Culture, blood (routine x 2)     Status: None   Collection Time: 10/27/15  9:18 AM  Result Value Ref Range Status   Specimen Description BLOOD RIGHT ARM  Final   Special Requests IN PEDIATRIC BOTTLE 3ML  Final   Culture   Final    NO GROWTH 5 DAYS Performed at [facility]    Report Status 11/01/2015 FINAL  Final  MRSA PCR Screening     Status: None   Collection Time: 10/28/15 10:17 AM  Result Value Ref Range Status   MRSA by PCR NEGATIVE NEGATIVE Final    Comment:        The GeneXpert MRSA Assay (FDA approved for NASAL specimens only), is one component of a comprehensive MRSA colonization surveillance program. It is not intended to diagnose MRSA infection nor to guide or monitor treatment for MRSA infections.   Culture, expectorated sputum-assessment     Status: None   Collection Time: 10/29/15  5:05 AM  Result Value Ref Range Status   Specimen Description SPUTUM  Final   Special Requests NONE  Final   Sputum evaluation   Final    THIS SPECIMEN IS ACCEPTABLE. RESPIRATORY CULTURE REPORT TO FOLLOW.   Report Status 10/29/2015 FINAL  Final  Culture, respiratory (NON-Expectorated)      Status: None   Collection Time: 10/29/15  5:05 AM  Result Value Ref Range Status   Specimen Description SPUTUM  Final   Special Requests NONE  Final   Gram Stain   Final    FEW WBC PRESENT,BOTH PMN AND MONONUCLEAR MODERATE SQUAMOUS EPITHELIAL CELLS PRESENT FEW GRAM NEGATIVE RODS Performed at [facility]    Culture   Final    FEW CANDIDA ALBICANS Performed at [facility]    Report Status 11/01/2015 FINAL  Final     Labs: Basic Metabolic Panel:  Recent Labs Lab 10/27/15 0613 10/29/15 0602 10/30/15 0604 10/31/15 0753 11/01/15 0538  NA 132* 138 137 138 137  K 4.3 3.6 3.3* 3.5 3.5  CL 102 111 108 107 105  CO2 21* 19* 20* 22 22  GLUCOSE 124* 80 87 91 90  BUN 12 6 <5* <5* 7  CREATININE 0.87 0.62 0.55 0.57 0.64  CALCIUM 9.3 8.4* 8.7* 9.1 9.6   Liver Function Tests:  Recent Labs Lab 10/27/15 0613  AST 48*  ALT 38  ALKPHOS 63  BILITOT 1.2  PROT 8.2*  ALBUMIN 3.4*    Recent Labs Lab 10/27/15 0613  LIPASE 23   CBC:  Recent Labs Lab 10/28/15 1050 10/29/15 0602 10/30/15 0604 10/31/15 0753 11/01/15 0538  WBC 12.8* 8.5 5.3 5.7 5.8  HGB 7.9* 7.2* 7.0* 10.4* 10.9*  HCT 25.9* 23.8* 22.2* 33.5* 35.4*  MCV 75.3* 74.8* 73.5* 76.7* 77.5*  PLT 263 315 307 397 422*   Cardiac Enzymes:  Recent Labs Lab 10/27/15 1255 10/27/15 2035  TROPONINI 0.03 <0.03    SIGNED: Time coordinating discharge: 30 minutes  Faye Ramsay, MD  [facility] 11/01/2015, 2:03 PM Pager [phone]  If 7PM-7AM, please contact night-coverage [email or web address] Password [email or web address]

## 2015-11-02 LAB — CBC
HCT: 36.9 % (ref 36.0–46.0)
Hemoglobin: 11.5 g/dL — ABNORMAL LOW (ref 12.0–15.0)
MCH: 24.2 pg — AB (ref 26.0–34.0)
MCHC: 31.2 g/dL (ref 30.0–36.0)
MCV: 77.5 fL — AB (ref 78.0–100.0)
PLATELETS: 550 10*3/uL — AB (ref 150–400)
RBC: 4.76 MIL/uL (ref 3.87–5.11)
RDW: 17.3 % — AB (ref 11.5–15.5)
WBC: 6 10*3/uL (ref 4.0–10.5)

## 2015-11-02 MED ORDER — LORAZEPAM 0.5 MG PO TABS: 0.5000 mg | ORAL_TABLET | ORAL | Status: DC | PRN

## 2015-11-02 MED ORDER — LEVOFLOXACIN 750 MG PO TABS: 750.0000 mg | ORAL_TABLET | Freq: Every day | ORAL | Status: DC

## 2015-11-02 MED ORDER — RIVAROXABAN (XARELTO) VTE STARTER PACK (15 & 20 MG): ORAL_TABLET | ORAL | Status: DC

## 2015-11-02 MED ORDER — HYDROMORPHONE HCL 2 MG PO TABS: 2.0000 mg | ORAL_TABLET | ORAL | Status: DC | PRN

## 2015-11-02 NOTE — Progress Notes (Signed)
Patient is set to discharge back to [facility] today. Patient aware. Discharge packet given to RN, Deidre Ala. [facility] called for transport to pickup at 12noon.     Raynaldo Opitz, [facility] Clinical Social Worker cell #: [phone]

## 2015-11-02 NOTE — Care Management Note (Signed)
Case Management Note  Patient Details  Name: ASTRYD FRIEL MRN: [id number] Date of Birth: 20-Feb-1966  Subjective/Objective:                    Action/Plan:d/c SNF.   Expected Discharge Date:   (unknown)               Expected Discharge Plan:  Skilled Nursing Facility  In-House Referral:  Clinical Social Work  Discharge planning Services  CM Consult  Post Acute Care Choice:    Choice offered to:     DME Arranged:    DME Agency:     HH Arranged:    [place] Agency:     Status of Service:  Completed, signed off  Medicare Important Message Given:  Yes Date Medicare IM Given:    Medicare IM give by:    Date Additional Medicare IM Given:    Additional Medicare Important Message give by:     If discussed at [place] of Stay Meetings, dates discussed:    Additional Comments:  Dessa Phi, RN 11/02/2015, 10:19 AM

## 2015-11-02 NOTE — Progress Notes (Signed)
Pt seen and examined at bedside, denies chest pain or shortness of breath, says she is ready to go. Blood work reviewed and stable. Please see d/c summary from 11/01/2015. No changes needed.  Faye Ramsay, MD  [facility] Pager [phone]  If 7PM-7AM, please contact night-coverage [email or web address] Password [email or web address]

## 2015-11-25 ENCOUNTER — Emergency Department (HOSPITAL_COMMUNITY): Payer: Medicare Other

## 2015-11-25 ENCOUNTER — Inpatient Hospital Stay (HOSPITAL_COMMUNITY)
Admission: EM | Admit: 2015-11-25 | Discharge: 2015-12-02 | DRG: 098 | Disposition: A | Discharge status: Skilled Nursing Facility | Payer: Medicare Other | Attending: Internal Medicine | Admitting: Internal Medicine

## 2015-11-25 ENCOUNTER — Encounter (HOSPITAL_COMMUNITY): Payer: Self-pay | Admitting: Emergency Medicine

## 2015-11-25 DIAGNOSIS — D638 Anemia in other chronic diseases classified elsewhere: Secondary | ICD-10-CM | POA: Diagnosis not present

## 2015-11-25 DIAGNOSIS — N390 Urinary tract infection, site not specified: Secondary | ICD-10-CM | POA: Diagnosis present

## 2015-11-25 DIAGNOSIS — E871 Hypo-osmolality and hyponatremia: Secondary | ICD-10-CM | POA: Diagnosis not present

## 2015-11-25 DIAGNOSIS — Z7983 Long term (current) use of bisphosphonates: Secondary | ICD-10-CM

## 2015-11-25 DIAGNOSIS — K5900 Constipation, unspecified: Secondary | ICD-10-CM | POA: Diagnosis present

## 2015-11-25 DIAGNOSIS — Z86711 Personal history of pulmonary embolism: Secondary | ICD-10-CM

## 2015-11-25 DIAGNOSIS — F329 Major depressive disorder, single episode, unspecified: Secondary | ICD-10-CM | POA: Diagnosis present

## 2015-11-25 DIAGNOSIS — H469 Unspecified optic neuritis: Secondary | ICD-10-CM | POA: Diagnosis not present

## 2015-11-25 DIAGNOSIS — K219 Gastro-esophageal reflux disease without esophagitis: Secondary | ICD-10-CM | POA: Diagnosis present

## 2015-11-25 DIAGNOSIS — G360 Neuromyelitis optica [Devic]: Secondary | ICD-10-CM | POA: Diagnosis present

## 2015-11-25 DIAGNOSIS — G0481 Other encephalitis and encephalomyelitis: Principal | ICD-10-CM | POA: Diagnosis present

## 2015-11-25 DIAGNOSIS — Z8673 Personal history of transient ischemic attack (TIA), and cerebral infarction without residual deficits: Secondary | ICD-10-CM | POA: Diagnosis not present

## 2015-11-25 DIAGNOSIS — G0490 Encephalitis and encephalomyelitis, unspecified: Secondary | ICD-10-CM

## 2015-11-25 DIAGNOSIS — Z7952 Long term (current) use of systemic steroids: Secondary | ICD-10-CM | POA: Diagnosis not present

## 2015-11-25 DIAGNOSIS — Z79899 Other long term (current) drug therapy: Secondary | ICD-10-CM

## 2015-11-25 DIAGNOSIS — H540 Blindness, both eyes: Secondary | ICD-10-CM | POA: Diagnosis present

## 2015-11-25 DIAGNOSIS — I10 Essential (primary) hypertension: Secondary | ICD-10-CM | POA: Diagnosis present

## 2015-11-25 DIAGNOSIS — M3219 Other organ or system involvement in systemic lupus erythematosus: Secondary | ICD-10-CM | POA: Diagnosis not present

## 2015-11-25 DIAGNOSIS — Z7901 Long term (current) use of anticoagulants: Secondary | ICD-10-CM

## 2015-11-25 HISTORY — DX: Other organ or system involvement in systemic lupus erythematosus: M32.19

## 2015-11-25 HISTORY — DX: Hypo-osmolality and hyponatremia: E87.1

## 2015-11-25 LAB — COMPREHENSIVE METABOLIC PANEL
ALK PHOS: 51 U/L (ref 38–126)
ALT: 24 U/L (ref 14–54)
ANION GAP: 12 (ref 5–15)
AST: 66 U/L — ABNORMAL HIGH (ref 15–41)
Albumin: 3.9 g/dL (ref 3.5–5.0)
BUN: 8 mg/dL (ref 6–20)
CALCIUM: 8.6 mg/dL — AB (ref 8.9–10.3)
CHLORIDE: 98 mmol/L — AB (ref 101–111)
CO2: 19 mmol/L — AB (ref 22–32)
CREATININE: 0.56 mg/dL (ref 0.44–1.00)
Glucose, Bld: 97 mg/dL (ref 65–99)
Potassium: 3.6 mmol/L (ref 3.5–5.1)
SODIUM: 129 mmol/L — AB (ref 135–145)
Total Bilirubin: 1.3 mg/dL — ABNORMAL HIGH (ref 0.3–1.2)
Total Protein: 7.7 g/dL (ref 6.5–8.1)

## 2015-11-25 LAB — CBC WITH DIFFERENTIAL/PLATELET
Basophils Absolute: 0 K/uL (ref 0.0–0.1)
Basophils Relative: 0 %
Eosinophils Absolute: 0.1 K/uL (ref 0.0–0.7)
Eosinophils Relative: 2 %
HCT: 33 % — ABNORMAL LOW (ref 36.0–46.0)
Hemoglobin: 10.8 g/dL — ABNORMAL LOW (ref 12.0–15.0)
Lymphocytes Relative: 15 %
Lymphs Abs: 0.5 K/uL — ABNORMAL LOW (ref 0.7–4.0)
MCH: 24.2 pg — ABNORMAL LOW (ref 26.0–34.0)
MCHC: 32.7 g/dL (ref 30.0–36.0)
MCV: 73.8 fL — ABNORMAL LOW (ref 78.0–100.0)
Monocytes Absolute: 0.5 K/uL (ref 0.1–1.0)
Monocytes Relative: 12 %
Neutro Abs: 2.6 K/uL (ref 1.7–7.7)
Neutrophils Relative %: 71 %
Platelets: 193 K/uL (ref 150–400)
RBC: 4.47 MIL/uL (ref 3.87–5.11)
RDW: 16.2 % — ABNORMAL HIGH (ref 11.5–15.5)
WBC: 3.6 K/uL — ABNORMAL LOW (ref 4.0–10.5)

## 2015-11-25 LAB — URINALYSIS, ROUTINE W REFLEX MICROSCOPIC
Bilirubin Urine: NEGATIVE
Glucose, UA: NEGATIVE mg/dL
Ketones, ur: 15 mg/dL — AB
Nitrite: NEGATIVE
Protein, ur: NEGATIVE mg/dL
Specific Gravity, Urine: 1.006 (ref 1.005–1.030)
pH: 6.5 (ref 5.0–8.0)

## 2015-11-25 LAB — URINE MICROSCOPIC-ADD ON

## 2015-11-25 LAB — LIPASE, BLOOD: Lipase: 27 U/L (ref 11–51)

## 2015-11-25 MED ORDER — GADOBENATE DIMEGLUMINE 529 MG/ML IV SOLN
15.0000 mL | Freq: Once | INTRAVENOUS | Status: AC | PRN
  Administered 2015-11-25: 14 mL via INTRAVENOUS

## 2015-11-25 MED ORDER — ALENDRONATE SODIUM 70 MG PO TABS: 70.0000 mg | ORAL_TABLET | ORAL | Status: DC

## 2015-11-25 MED ORDER — ONDANSETRON HCL 4 MG PO TABS: 4.0000 mg | ORAL_TABLET | Freq: Four times a day (QID) | ORAL | Status: DC | PRN

## 2015-11-25 MED ORDER — LORAZEPAM 1 MG PO TABS
1.0000 mg | ORAL_TABLET | Freq: Four times a day (QID) | ORAL | Status: DC
  Administered 2015-11-25 – 2015-12-02 (×25): 1 mg via ORAL
  Filled 2015-11-25 (×25): qty 1

## 2015-11-25 MED ORDER — METHYLPREDNISOLONE SODIUM SUCC 125 MG IJ SOLR
125.0000 mg | Freq: Once | INTRAMUSCULAR | Status: AC
Start: 2015-11-25 — End: 2015-11-25
  Administered 2015-11-25: 125 mg via INTRAVENOUS
  Filled 2015-11-25: qty 2

## 2015-11-25 MED ORDER — HYDROMORPHONE HCL 1 MG/ML IJ SOLN
1.0000 mg | INTRAMUSCULAR | Status: DC | PRN
  Administered 2015-11-25 (×2): 1 mg via INTRAVENOUS
  Filled 2015-11-25 (×2): qty 1

## 2015-11-25 MED ORDER — PANTOPRAZOLE SODIUM 40 MG PO TBEC
40.0000 mg | DELAYED_RELEASE_TABLET | Freq: Every day | ORAL | Status: DC
  Administered 2015-11-26 – 2015-12-02 (×7): 40 mg via ORAL
  Filled 2015-11-25 (×7): qty 1

## 2015-11-25 MED ORDER — LORAZEPAM 2 MG/ML IJ SOLN
1.0000 mg | INTRAMUSCULAR | Status: DC | PRN
  Administered 2015-11-25 (×2): 1 mg via INTRAVENOUS
  Filled 2015-11-25 (×2): qty 1

## 2015-11-25 MED ORDER — DEXTROSE 5 % IV SOLN
1.0000 g | INTRAVENOUS | Status: DC
  Administered 2015-11-25 – 2015-11-27 (×3): 1 g via INTRAVENOUS
  Filled 2015-11-25 (×4): qty 10

## 2015-11-25 MED ORDER — RIVAROXABAN 20 MG PO TABS
20.0000 mg | ORAL_TABLET | Freq: Every day | ORAL | Status: DC
  Administered 2015-11-25 – 2015-12-01 (×7): 20 mg via ORAL
  Filled 2015-11-25 (×8): qty 1

## 2015-11-25 MED ORDER — HYDROMORPHONE HCL 2 MG PO TABS
4.0000 mg | ORAL_TABLET | ORAL | Status: DC | PRN
  Administered 2015-11-29: 4 mg via ORAL
  Filled 2015-11-25: qty 2

## 2015-11-25 MED ORDER — PREGABALIN 75 MG PO CAPS
150.0000 mg | ORAL_CAPSULE | Freq: Two times a day (BID) | ORAL | Status: DC
Start: 2015-11-25 — End: 2015-12-02
  Administered 2015-11-25 – 2015-12-02 (×14): 150 mg via ORAL
  Filled 2015-11-25 (×14): qty 2

## 2015-11-25 MED ORDER — METHYLPREDNISOLONE SODIUM SUCC 125 MG IJ SOLR
125.0000 mg | INTRAMUSCULAR | Status: DC
  Administered 2015-11-26 – 2015-11-27 (×2): 125 mg via INTRAVENOUS
  Filled 2015-11-25 (×2): qty 2

## 2015-11-25 MED ORDER — ONDANSETRON HCL 4 MG/2ML IJ SOLN: 4.0000 mg | Freq: Four times a day (QID) | INTRAMUSCULAR | Status: DC | PRN

## 2015-11-25 MED ORDER — ACETAMINOPHEN 325 MG PO TABS
650.0000 mg | ORAL_TABLET | ORAL | Status: DC | PRN
  Administered 2015-11-25: 650 mg via ORAL
  Filled 2015-11-25 (×2): qty 2

## 2015-11-25 MED ORDER — AZATHIOPRINE 50 MG PO TABS
50.0000 mg | ORAL_TABLET | Freq: Every day | ORAL | Status: DC
  Administered 2015-11-26 – 2015-12-02 (×7): 50 mg via ORAL
  Filled 2015-11-25 (×13): qty 1

## 2015-11-25 MED ORDER — SODIUM CHLORIDE 0.9 % IV SOLN
INTRAVENOUS | Status: DC
  Administered 2015-11-25 – 2015-11-29 (×5): via INTRAVENOUS

## 2015-11-25 MED ORDER — SODIUM CHLORIDE 0.9 % IV BOLUS (SEPSIS)
1000.0000 mL | Freq: Once | INTRAVENOUS | Status: AC
  Administered 2015-11-25: 1000 mL via INTRAVENOUS

## 2015-11-25 MED ORDER — SERTRALINE HCL 50 MG PO TABS
75.0000 mg | ORAL_TABLET | Freq: Every day | ORAL | Status: DC
  Administered 2015-11-26 – 2015-11-27 (×2): 75 mg via ORAL
  Filled 2015-11-25 (×2): qty 2

## 2015-11-25 NOTE — Progress Notes (Signed)
Pt arrived to floor via stretcher, transferred from [place], Notified MD of at arrival to flloor, oriented patient to unit.

## 2015-11-25 NOTE — ED Notes (Signed)
Bed: [id number] Expected date:  Expected time:  Means of arrival:  Comments: EMS/45

## 2015-11-25 NOTE — Progress Notes (Signed)

## 2015-11-25 NOTE — ED Notes (Signed)
Pt transported to MRI 

## 2015-11-25 NOTE — ED Notes (Signed)
Pt. Awaiting fo r transport to [facility], [place] called an hour ago followed up ETA.

## 2015-11-25 NOTE — ED Notes (Addendum)
Per EMS patient from [facility] with complaint of urinary retention and fever onset of Wednesday.  Patient continues to report abdominal pain related to urinary retention but denies such pain post in and out catheter this morning with 300 mL output at facility.

## 2015-11-25 NOTE — ED Notes (Addendum)
Contacted facility whom reports gave two tablets of tylenol prior to EMS arrival to treat fever. [place] aware of pt status and complaint.

## 2015-11-25 NOTE — ED Provider Notes (Signed)
CSN: [id number]     Arrival date & time 11/25/15  1001 History   First MD Initiated Contact with Patient 11/25/15 1018     Chief Complaint  Patient presents with  . Urinary Retention  . Fever     (Consider location/radiation/quality/duration/timing/severity/associated sxs/prior Treatment) HPI This patient with a history of lupus, stroke 3 months ago now presents with concern for difficulty urinating, fever, vision changes. Patient notes that she was in her usual state of health until about 3 days ago. Since that time she has had progression from typical visual impairment to vision loss bilaterally. In addition, she has had intermittent fever, transiently controlled with Tylenol. She also complains of new difficulty urinating, though no dysuria. She denies changes in her asymmetric weakness, with persistent left-sided hemiplegia. No persistent abdominal or chest pain. No vomiting, no diarrhea. No new medication changes.  Past Medical History  Diagnosis Date  . Hypertension   . Blind   . Lupus ([place])   . Kidney stones   . Shortness of breath     due to medication  . Depression   . GERD (gastroesophageal reflux disease)   . Headache(784.0)   . Arthritis   . Stroke [facility]) 2008    visually impaired   Past Surgical History  Procedure Laterality Date  . Tubal ligation    . Tubal ligation    . Breast biopsy     Family History  Problem Relation Age of Onset  . Cancer Mother   . Cancer Father    Social History  Substance Use Topics  . Smoking status: Never Smoker   . Smokeless tobacco: Never Used  . Alcohol Use: No   OB History    Gravida Para Term Preterm AB TAB SAB Ectopic Multiple Living   3 3 3       3      Review of Systems  Constitutional:       Per HPI, otherwise negative  HENT:       Per HPI, otherwise negative  Eyes: Positive for visual disturbance.  Respiratory:       Per HPI, otherwise negative  Cardiovascular:       Per HPI, otherwise negative   Gastrointestinal: Negative for vomiting.  Endocrine:       Negative aside from HPI  Genitourinary:       Neg aside from HPI   Musculoskeletal:       Per HPI, otherwise negative  Skin: Negative.   Allergic/Immunologic: Positive for immunocompromised state.  Neurological: Positive for weakness. Negative for syncope.      Allergies  Review of patient's allergies indicates no known allergies.  Home Medications   Prior to Admission medications   Medication Sig Start Date End Date Taking? Authorizing Provider  acetaminophen (TYLENOL) 325 MG tablet Take 650 mg by mouth every 4 (four) hours as needed for fever.   Yes Historical Provider, MD  alendronate (FOSAMAX) 70 MG tablet Take 70 mg by mouth once a week. Take with a full glass of water on an empty stomach.   Yes Historical Provider, MD  azaTHIOprine (IMURAN) 50 MG tablet Take 50 mg by mouth daily.  02/10/13  Yes Historical Provider, MD  HYDROmorphone (DILAUDID) 2 MG tablet Take 1 tablet (2 mg total) by mouth every 4 (four) hours as needed for severe pain. Patient taking differently: Take 4 mg by mouth every 4 (four) hours as needed for severe pain.  11/02/15  Yes Theodis Blaze, MD  LORazepam (ATIVAN) 1  MG tablet Take 1 mg by mouth 4 (four) times daily.   Yes Historical Provider, MD  omeprazole (PRILOSEC) 20 MG capsule Take 20 mg by mouth daily.  01/18/13  Yes Historical Provider, MD  predniSONE (DELTASONE) 5 MG tablet Take 5 mg by mouth daily with breakfast.   Yes Historical Provider, MD  pregabalin (LYRICA) 150 MG capsule Take 150 mg by mouth every 12 (twelve) hours.   Yes Historical Provider, MD  Rivaroxaban (XARELTO STARTER PACK) 15 & 20 MG TBPK Take as directed on package: Start with one 15mg  tablet by mouth twice a day with food. On Day 22, switch to one 20mg  tablet once a day with food. 11/02/15  Yes Theodis Blaze, MD  sertraline (ZOLOFT) 50 MG tablet Take 75 mg by mouth daily.   Yes Historical Provider, MD   BP 119/73 mmHg  Pulse 95   Temp(Src) 98.3 F (36.8 C) (Oral)  Resp 18  SpO2 96% Physical Exam  Constitutional: She is oriented to person, place, and time. She appears well-developed and well-nourished. No distress.  HENT:  Head: Normocephalic and atraumatic.  Eyes: Conjunctivae and EOM are normal.  Patient does not track, cannot discern numbers on exam  Cardiovascular: Normal rate and regular rhythm.   Pulmonary/Chest: Effort normal and breath sounds normal. No stridor. No respiratory distress.  Abdominal: She exhibits no distension.  Musculoskeletal: She exhibits no edema.  Neurological: She is alert and oriented to person, place, and time.  Left-sided hemiplegia, speech is brief, clear, appropriate right-sided strength is 5/5 upper and lower No gross facial asymmetry Visual deficit as above  Skin: Skin is warm and dry.  Psychiatric: She has a normal mood and affect.  Nursing note and vitals reviewed.   ED Course  Procedures (including critical care time) Labs Review Labs Reviewed  COMPREHENSIVE METABOLIC PANEL - Abnormal; Notable for the following:    Sodium 129 (*)    Chloride 98 (*)    CO2 19 (*)    Calcium 8.6 (*)    AST 66 (*)    Total Bilirubin 1.3 (*)    All other components within normal limits  CBC WITH DIFFERENTIAL/PLATELET - Abnormal; Notable for the following:    WBC 3.6 (*)    Hemoglobin 10.8 (*)    HCT 33.0 (*)    MCV 73.8 (*)    MCH 24.2 (*)    RDW 16.2 (*)    Lymphs Abs 0.5 (*)    All other components within normal limits  LIPASE, BLOOD  URINALYSIS, ROUTINE W REFLEX MICROSCOPIC (NOT AT [facility])    Imaging Review Mr Jeri Cos Wo Contrast  11/25/2015  CLINICAL DATA:  50 year old female with urinary retention, possible cerebritis. Personal history of lupus and Devic's disease. Initial encounter. EXAM: MRI HEAD WITHOUT AND WITH CONTRAST TECHNIQUE: Multiplanar, multiecho pulse sequences of the brain and surrounding structures were obtained without and with intravenous contrast.  CONTRAST:  54mL MULTIHANCE GADOBENATE DIMEGLUMINE 529 MG/ML IV SOLN COMPARISON:  Brain and cervical spine MRI 09/21/2015. FINDINGS: Major intracranial vascular flow voids are stable and within normal limits. Regression of abnormal T2 hyperintense signal at the cervicomedullary junction since January (series 8, image 1). Sagittal images also suggest resolution of the abnormal cervical cord expansion which was associated. No restricted diffusion to suggest acute infarction. No midline shift, mass effect, evidence of mass lesion, ventriculomegaly, extra-axial collection or acute intracranial hemorrhage. Cervicomedullary junction and pituitary are within normal limits. New Patchy and confluent posterior superior cerebral white matter FLAIR  hyperintensity (series 9, image 15). While at the same time the posterior left thalamic signal abnormality seen in January has regressed. The new changes extend on the left to the posterior limb of the left internal capsule (series 8, image 13, and on the right to the frontal operculum. While superiorly the changes on the left extend to the subcortical left sensory strip white matter, and on the right the subcortical right motor strip white matter. There is mild associated patchy enhancement in the left hemispheric white matter abnormality, no associated enhancement in the right hemisphere. No dural thickening. No other abnormal intracranial enhancement. No other new signal abnormality. Visible internal auditory structures appear normal. Mastoids are clear. Minimal paranasal sinus mucosal thickening has not significantly changed. Grossly normal orbit soft tissues. Negative scalp soft tissues. Visualized bone marrow signal is within normal limits. IMPRESSION: 1. Acute patchy and confluent bilateral posterior hemisphere white matter FLAIR hyperintensity, with patchy associated enhancement on the left. No associated acute infarct. This new signal abnormality could reflect acute  demyelinating disease or lupus related cerebritis in this setting. 2. At the same time the previously-seen abnormal signal in the left thalamus and cervicomedullary junction has regressed since January. Cervical cord signal is not otherwise evaluated, but sagittal images of the brain suggests to the cord swelling has also resolved. Electronically Signed   By: Genevie Ann M.D.   On: 11/25/2015 12:37   I have personally reviewed and evaluated these images and lab results as part of my medical decision-making.  Chart review notable for stroke earlier this year, initiation of Xarelto. Patient also noted to have Devic's disease  Pulse oxygen 99% room air normal Patient is afebrile on my initial evaluation. Discussed her case with EMS providers on arrival.  They corroborate history.   1:39 PM MRI consistent with immediate concern of demyelinating or intracranial pathology. I discussed the findings with our neurology team. We agreed that the patient's initiation of steroids, empirically, will be followed with high-dose steroids. Patient will require transfer to our affiliated neurologic specialty Center for further evaluation and management.   MDM   Final diagnoses:  Cerebritis   Patient with history of lupus, as well as Devic's disease presents with new vision loss, as well as difficulty with urination. Given the patient's history, and her description of new vision changes, new difficulty with urination, intracranial pathology with intermediate suspicion. Patient signed a new cranial lesions concerning for either demyelinating disease,  possibly lupus cerebritis.  patient required initiation of steroids here, transferred to our specialty neurologic Center for further evaluation and management.    CRITICAL CARE Performed by: Carmin Muskrat Total critical care time: 40 minutes Critical care time was exclusive of separately billable procedures and treating other patients. Critical care was  necessary to treat or prevent imminent or life-threatening deterioration. Critical care was time spent personally by me on the following activities: development of treatment plan with patient and/or surrogate as well as nursing, discussions with consultants, evaluation of patient's response to treatment, examination of patient, obtaining history from patient or surrogate, ordering and performing treatments and interventions, ordering and review of laboratory studies, ordering and review of radiographic studies, pulse oximetry and re-evaluation of patient's condition.   Carmin Muskrat, MD 11/25/15 1340

## 2015-11-25 NOTE — H&P (Signed)
[facility] History and Physical  Andrea Berry [id number] DOB: 01-28-1966 DOA: 11/25/2015  Referring physician: ER physician: Dr. Vanita Panda PCP: Minette Brine  Chief Complaint: fever, urinary retention   HPI:  50 year old female with history of SLE on chronic immunosuppressant therapy, has had recent hospitalization in February for pulmonary embolism and infarction ans sepsis due to pneumonia and was discharged to SNF, further history of Devic's disease. She presented to [facility] ED with reports intermittent vision changes with fevers. No headaches, no falls or loss of consciousness. No lower or upper extremity weakness. She does report having difficulty urinating. No hematuria and no burning sensation on urination. No chest pain, no palpitations. No shortness of breath. No diarrhea or constipation, no diarrhea.  In ED, BP was 119/73, HR 118, afebrile and oxygen saturation was 92-99% on room air. Blood work showed hemoglobin of 10.8, sodium of 129, normal creatinine. MRI brian showed acute patchy and confluent bilateral posterior hemisphere white matter flair hyperintensity, with patchy associated enhancement on the left which could reflect acute demyelinating disease or lupus related cerebritis. No associated acute infarct. Neurology recommended IV steroids in ED and transfer to [facility].   Assessment & Plan    Active Problems:   Lupus cerebritis ([place]) / H/O Devic's disease ([place]) - Based on MRI results, findings could likely represent lupus cerebritis - Solumedrol 125 mg IV given in ED - Continue solumedrol 125 mg IV daily unless neurology changes the dose and/or frequency  - Appreciate neurology recommendation and following     UTI (lower urinary tract infection) - Trace leukocytes and many bacteria seen on UA - Started IV rocephin - F/U urine culture results    Anemia of chronic disease - Due to history of SLE - Hemoglobin 10.8, stable     Hyponatremia - Likely prerenal  - Continue  IV fluids - F/U BMP in am   Pulmonary embolism with infarction ([place]) - On recent admission, RUE positive for mobile acute deep vein thrombosis involving the right internal jugular vein - Continue xarelto    DVT prophylaxis:  - SCD's bilaterally   Radiological Exams on Admission: Mr Andrea Berry Contrast 11/25/2015   1. Acute patchy and confluent bilateral posterior hemisphere white matter FLAIR hyperintensity, with patchy associated enhancement on the left. No associated acute infarct. This new signal abnormality could reflect acute demyelinating disease or lupus related cerebritis in this setting. 2. At the same time the previously-seen abnormal signal in the left thalamus and cervicomedullary junction has regressed since January. Cervical cord signal is not otherwise evaluated, but sagittal images of the brain suggests to the cord swelling has also resolved. Electronically Signed   By: Genevie Ann M.D.   On: 11/25/2015 12:37    EKG: not done in ED  Code Status: Full Family Communication: Plan of care discussed with the patient  Disposition Plan: Admit for further evaluation, per neurology - transfer to [facility], MD  [facility] Pager [phone]  Time spent in minutes: 75 minutes  Review of Systems:  Constitutional: Negative for chills and malaise/fatigue. Negative for diaphoresis.  HENT: Negative for hearing loss, ear pain, nosebleeds, congestion, sore throat, neck pain, tinnitus and ear discharge.   Eyes: Negative for photophobia, pain, discharge and redness.  Respiratory: Negative for cough, hemoptysis, sputum production, shortness of breath, wheezing and stridor.   Cardiovascular: Negative for chest pain, palpitations, orthopnea, claudication and leg swelling.  Gastrointestinal: Negative for nausea, vomiting and abdominal pain. Negative for heartburn, constipation,  blood in stool and melena.  Genitourinary: Negative for dysuria, urgency, frequency, hematuria and flank  pain.  Musculoskeletal: Negative for myalgias, back pain, joint pain and falls.  Skin: Negative for itching and rash.  Neurological: Negative for dizziness and weakness. Negative for tingling, tremors, sensory change, speech change, focal weakness, loss of consciousness and headaches.  Endo/Heme/Allergies: Negative for environmental allergies and polydipsia. Does not bruise/bleed easily.  Psychiatric/Behavioral: Negative for suicidal ideas. The patient is not nervous/anxious.      Past Medical History  Diagnosis Date  . Hypertension   . Blind   . Lupus ([place])   . Kidney stones   . Shortness of breath     due to medication  . Depression   . GERD (gastroesophageal reflux disease)   . Headache(784.0)   . Arthritis   . Stroke [facility]) 2008    visually impaired   Past Surgical History  Procedure Laterality Date  . Tubal ligation    . Tubal ligation    . Breast biopsy     Social History:  reports that she has never smoked. She has never used smokeless tobacco. She reports that she does not drink alcohol or use illicit drugs.  No Known Allergies  Family History:  Family History  Problem Relation Age of Onset  . Cancer Mother   . Cancer Father      Prior to Admission medications   Medication Sig Start Date End Date Taking? Authorizing Provider  acetaminophen (TYLENOL) 325 MG tablet Take 650 mg by mouth every 4 (four) hours as needed for fever.   Yes Historical Provider, MD  alendronate (FOSAMAX) 70 MG tablet Take 70 mg by mouth once a week. Take with a full glass of water on an empty stomach.   Yes Historical Provider, MD  azaTHIOprine (IMURAN) 50 MG tablet Take 50 mg by mouth daily.  02/10/13  Yes Historical Provider, MD  HYDROmorphone (DILAUDID) 2 MG tablet Take 1 tablet (2 mg total) by mouth every 4 (four) hours as needed for severe pain. Patient taking differently: Take 4 mg by mouth every 4 (four) hours as needed for severe pain.  11/02/15  Yes Theodis Blaze, MD  LORazepam  (ATIVAN) 1 MG tablet Take 1 mg by mouth 4 (four) times daily.   Yes Historical Provider, MD  omeprazole (PRILOSEC) 20 MG capsule Take 20 mg by mouth daily.  01/18/13  Yes Historical Provider, MD  predniSONE (DELTASONE) 5 MG tablet Take 5 mg by mouth daily with breakfast.   Yes Historical Provider, MD  pregabalin (LYRICA) 150 MG capsule Take 150 mg by mouth every 12 (twelve) hours.   Yes Historical Provider, MD  Rivaroxaban (XARELTO STARTER PACK) 15 & 20 MG TBPK Take as directed on package: Start with one 15mg  tablet by mouth twice a day with food. On Day 22, switch to one 20mg  tablet once a day with food. 11/02/15  Yes Theodis Blaze, MD  sertraline (ZOLOFT) 50 MG tablet Take 75 mg by mouth daily.   Yes Historical Provider, MD   Physical Exam: Filed Vitals:   11/25/15 1126 11/25/15 1230 11/25/15 1300 11/25/15 1404  BP:  125/85 119/73 125/76  Pulse: 96 97 95 99  Temp: 98.2 F (36.8 C) 98.3 F (36.8 C)  98.5 F (36.9 C)  TempSrc: Oral Oral  Oral  Resp: 20 18  18   SpO2: 97% 98% 96% 92%    Physical Exam  Constitutional: Appears well-developed and well-nourished. No distress.  HENT: Normocephalic. No tonsillar  erythema or exudates Eyes: Conjunctivae are normal. No scleral icterus.  Neck: Normal ROM. Neck supple. No JVD. No tracheal deviation. No thyromegaly.  CVS: RRR, S1/S2 +, no murmurs, no gallops, no carotid bruit.  Pulmonary: Effort and breath sounds normal, no stridor, rhonchi, wheezes, rales.  Abdominal: Soft. BS +,  no distension, tenderness, rebound or guarding.  Musculoskeletal: Normal range of motion. No edema and no tenderness.  Lymphadenopathy: No lymphadenopathy noted, cervical, inguinal. Neuro: Alert. Normal reflexes, muscle tone coordination. No focal neurologic deficits. Skin: Skin is warm and dry. No rash noted.  No erythema. No pallor.  Psychiatric: Normal mood and affect. Behavior, judgment, thought content normal.   Labs on Admission:  Basic Metabolic Panel:  Recent  Labs Lab 11/25/15 1109  NA 129*  K 3.6  CL 98*  CO2 19*  GLUCOSE 97  BUN 8  CREATININE 0.56  CALCIUM 8.6*   Liver Function Tests:  Recent Labs Lab 11/25/15 1109  AST 66*  ALT 24  ALKPHOS 51  BILITOT 1.3*  PROT 7.7  ALBUMIN 3.9    Recent Labs Lab 11/25/15 1109  LIPASE 27   No results for input(s): AMMONIA in the last 168 hours. CBC:  Recent Labs Lab 11/25/15 1109  WBC 3.6*  NEUTROABS 2.6  HGB 10.8*  HCT 33.0*  MCV 73.8*  PLT 193   Cardiac Enzymes: No results for input(s): CKTOTAL, CKMB, CKMBINDEX, TROPONINI in the last 168 hours. BNP: Invalid input(s): POCBNP CBG: No results for input(s): GLUCAP in the last 168 hours.  If 7PM-7AM, please contact night-coverage [email or web address] Password [facility] 11/25/2015, 2:29 PM

## 2015-11-25 NOTE — Progress Notes (Signed)
ANTICOAGULATION CONSULT NOTE - Initial Consult  Pharmacy Consult for rivaroxaban Indication: recent history of PE  No Known Allergies  Patient Measurements:   Heparin Dosing Weight:   Vital Signs: Temp: 100.4 F (38 C) (03/17 1835) Temp Source: Oral (03/17 1835) BP: 149/90 mmHg (03/17 1835) Pulse Rate: 123 (03/17 1835)  Labs:  Recent Labs  11/25/15 1109  HGB 10.8*  HCT 33.0*  PLT 193  CREATININE 0.56    CrCl cannot be calculated (Unknown ideal weight.).   Medical History: Past Medical History  Diagnosis Date  . Hypertension   . Blind   . Lupus ([place])   . Kidney stones   . Shortness of breath     due to medication  . Depression   . GERD (gastroesophageal reflux disease)   . Headache(784.0)   . Arthritis   . Stroke [facility]) 2008    visually impaired    Assessment: 21 YOF presents with urinary retention and fever.  Diagnosed with PE and DVT during last admission in February.  She converted from 15mg  BID to 20mg  daily on 3/16.    Today's labs, 11/25/2015  Renal function WNL  CBC: Hgb = 10.8, pltc WNL  No bleeding noted   Plan:   Resume xarelto 20mg  qsupper  No further dose adjustment needed. Sign-off from formal note writing unless problem arises  Doreene Eland, PharmD, BCPS.   Pager: [phone] 11/25/2015 6:47 PM

## 2015-11-25 NOTE — ED Notes (Addendum)
Bolus still in process at present time; MRI clamped during procedure.

## 2015-11-26 LAB — CBC
HEMATOCRIT: 34.7 % — AB (ref 36.0–46.0)
HEMOGLOBIN: 11.3 g/dL — AB (ref 12.0–15.0)
MCH: 23.9 pg — AB (ref 26.0–34.0)
MCHC: 32.6 g/dL (ref 30.0–36.0)
MCV: 73.5 fL — AB (ref 78.0–100.0)
PLATELETS: 284 10*3/uL (ref 150–400)
RBC: 4.72 MIL/uL (ref 3.87–5.11)
RDW: 16.5 % — ABNORMAL HIGH (ref 11.5–15.5)
WBC: 3.5 10*3/uL — AB (ref 4.0–10.5)

## 2015-11-26 LAB — COMPREHENSIVE METABOLIC PANEL
ALBUMIN: 3.5 g/dL (ref 3.5–5.0)
ALK PHOS: 49 U/L (ref 38–126)
ALT: 25 U/L (ref 14–54)
ANION GAP: 14 (ref 5–15)
AST: 62 U/L — ABNORMAL HIGH (ref 15–41)
BUN: 9 mg/dL (ref 6–20)
CALCIUM: 8.8 mg/dL — AB (ref 8.9–10.3)
CHLORIDE: 103 mmol/L (ref 101–111)
CO2: 17 mmol/L — AB (ref 22–32)
Creatinine, Ser: 0.66 mg/dL (ref 0.44–1.00)
GFR calc Af Amer: >60 mL/min (ref >60)
GFR calc non Af Amer: >60 mL/min (ref >60)
GLUCOSE: 110 mg/dL — AB (ref 65–99)
Potassium: 4.5 mmol/L (ref 3.5–5.1)
SODIUM: 134 mmol/L — AB (ref 135–145)
Total Bilirubin: 0.7 mg/dL (ref 0.3–1.2)
Total Protein: 7.2 g/dL (ref 6.5–8.1)

## 2015-11-26 LAB — BASIC METABOLIC PANEL
Anion gap: 12 (ref 5–15)
BUN: 8 mg/dL (ref 6–20)
CHLORIDE: 106 mmol/L (ref 101–111)
CO2: 18 mmol/L — AB (ref 22–32)
CREATININE: 0.54 mg/dL (ref 0.44–1.00)
Calcium: 8.9 mg/dL (ref 8.9–10.3)
GFR calc non Af Amer: >60 mL/min (ref >60)
Glucose, Bld: 81 mg/dL (ref 65–99)
POTASSIUM: 4.4 mmol/L (ref 3.5–5.1)
Sodium: 136 mmol/L (ref 135–145)

## 2015-11-26 LAB — TSH: TSH: 0.464 u[IU]/mL (ref 0.350–4.500)

## 2015-11-26 LAB — MRSA PCR SCREENING: MRSA by PCR: NEGATIVE

## 2015-11-26 NOTE — Progress Notes (Signed)
rn assisted pt to call son on her cell phone.

## 2015-11-26 NOTE — Progress Notes (Signed)
[facility] PROGRESS NOTE  Andrea Berry [id number] DOB: 22-Nov-1965 DOA: 11/25/2015 PCP: Minette Brine  Assessment/Plan:   Lupus cerebritis ([place]) - Management per Neurology  Active Problems:   Anemia of chronic disease - improved on last check    UTI (lower urinary tract infection) - continue on rocephin and f/u with urine culture.    Hyponatremia - resolved and most likely due to poor oral intake  Code Status: full Family Communication: d/c pt and son at bedside.  Disposition Plan: pending improvement in condition   Consultants:  None  Procedures:  None  Antibiotics:  Rocephin  HPI/Subjective: Patient is sad that her vision is affected with everything that is going on. She reports no new medical complaints  Objective: Filed Vitals:   11/26/15 0539 11/26/15 0942  BP: 127/82 122/71  Pulse: 96 91  Temp: 97.9 F (36.6 C) 98.3 F (36.8 C)  Resp: 18 18    Intake/Output Summary (Last 24 hours) at 11/26/15 1405 Last data filed at 11/26/15 0542  Gross per 24 hour  Intake      0 ml  Output    800 ml  Net   -800 ml   There were no vitals filed for this visit.  Exam:   General:  Patient in no acute distress, alert and awake  Cardiovascular: S1 and S2 within normal limits  Respiratory: No increased work of breathing, equal chest rise, no wheezes  Abdomen: Soft, nondistended, nontender  Musculoskeletal: No cyanosis or clubbing  Data Reviewed: Basic Metabolic Panel:  Recent Labs Lab 11/25/15 1109 11/25/15 2348 11/26/15 0621  NA 129* 134* 136  K 3.6 4.5 4.4  CL 98* 103 106  CO2 19* 17* 18*  GLUCOSE 97 110* 81  BUN 8 9 8   CREATININE 0.56 0.66 0.54  CALCIUM 8.6* 8.8* 8.9   Liver Function Tests:  Recent Labs Lab 11/25/15 1109 11/25/15 2348  AST 66* 62*  ALT 24 25  ALKPHOS 51 49  BILITOT 1.3* 0.7  PROT 7.7 7.2  ALBUMIN 3.9 3.5    Recent Labs Lab 11/25/15 1109  LIPASE 27   No results for input(s): AMMONIA in the last 168  hours. CBC:  Recent Labs Lab 11/25/15 1109 11/26/15 0621  WBC 3.6* 3.5*  NEUTROABS 2.6  --   HGB 10.8* 11.3*  HCT 33.0* 34.7*  MCV 73.8* 73.5*  PLT 193 284   Cardiac Enzymes: No results for input(s): CKTOTAL, CKMB, CKMBINDEX, TROPONINI in the last 168 hours. BNP (last 3 results) No results for input(s): BNP in the last 8760 hours.  ProBNP (last 3 results) No results for input(s): PROBNP in the last 8760 hours.  CBG: No results for input(s): GLUCAP in the last 168 hours.  Recent Results (from the past 240 hour(s))  Culture, Urine     Status: None (Preliminary result)   Collection Time: 11/25/15  2:58 PM  Result Value Ref Range Status   Specimen Description Urine  Final   Special Requests NONE  Final   Culture   Final    TOO YOUNG TO READ Performed at [facility]    Report Status PENDING  Incomplete  MRSA PCR Screening     Status: None   Collection Time: 11/26/15  4:28 AM  Result Value Ref Range Status   MRSA by PCR NEGATIVE NEGATIVE Final    Comment:        The GeneXpert MRSA Assay (FDA approved for NASAL specimens only), is one component of a comprehensive MRSA  colonization surveillance program. It is not intended to diagnose MRSA infection nor to guide or monitor treatment for MRSA infections.      Studies: Mr [name] Contrast  22-Dec-2015  CLINICAL DATA:  50 year old female with urinary retention, possible cerebritis. Personal history of lupus and Devic's disease. Initial encounter. EXAM: MRI HEAD WITHOUT AND WITH CONTRAST TECHNIQUE: Multiplanar, multiecho pulse sequences of the brain and surrounding structures were obtained without and with intravenous contrast. CONTRAST:  33mL MULTIHANCE GADOBENATE DIMEGLUMINE 529 MG/ML IV SOLN COMPARISON:  Brain and cervical spine MRI 09/21/2015. FINDINGS: Major intracranial vascular flow voids are stable and within normal limits. Regression of abnormal T2 hyperintense signal at the cervicomedullary junction since  January (series 8, image 1). Sagittal images also suggest resolution of the abnormal cervical cord expansion which was associated. No restricted diffusion to suggest acute infarction. No midline shift, mass effect, evidence of mass lesion, ventriculomegaly, extra-axial collection or acute intracranial hemorrhage. Cervicomedullary junction and pituitary are within normal limits. New Patchy and confluent posterior superior cerebral white matter FLAIR hyperintensity (series 9, image 15). While at the same time the posterior left thalamic signal abnormality seen in January has regressed. The new changes extend on the left to the posterior limb of the left internal capsule (series 8, image 13, and on the right to the frontal operculum. While superiorly the changes on the left extend to the subcortical left sensory strip white matter, and on the right the subcortical right motor strip white matter. There is mild associated patchy enhancement in the left hemispheric white matter abnormality, no associated enhancement in the right hemisphere. No dural thickening. No other abnormal intracranial enhancement. No other new signal abnormality. Visible internal auditory structures appear normal. Mastoids are clear. Minimal paranasal sinus mucosal thickening has not significantly changed. Grossly normal orbit soft tissues. Negative scalp soft tissues. Visualized bone marrow signal is within normal limits. IMPRESSION: 1. Acute patchy and confluent bilateral posterior hemisphere white matter FLAIR hyperintensity, with patchy associated enhancement on the left. No associated acute infarct. This new signal abnormality could reflect acute demyelinating disease or lupus related cerebritis in this setting. 2. At the same time the previously-seen abnormal signal in the left thalamus and cervicomedullary junction has regressed since January. Cervical cord signal is not otherwise evaluated, but sagittal images of the brain suggests to the  cord swelling has also resolved. Electronically Signed   By: Genevie Ann M.D.   On: 22-Dec-2015 12:37    Scheduled Meds: . azaTHIOprine  50 mg Oral Daily  . cefTRIAXone (ROCEPHIN)  IV  1 g Intravenous Q24H  . LORazepam  1 mg Oral 4 times per day  . methylPREDNISolone sodium succinate  125 mg Intravenous Q24H  . pantoprazole  40 mg Oral Daily  . pregabalin  150 mg Oral Q12H  . rivaroxaban  20 mg Oral Q supper  . sertraline  75 mg Oral Daily   Continuous Infusions: . sodium chloride 50 mL/hr at 11/26/15 0018     Time spent: > 25 minutes    Velvet Bathe  [facility] Pager [phone]. If 7PM-7AM, please contact night-coverage at [email or web address], password [facility] 11/26/2015, 2:05 PM  LOS: 1 day

## 2015-11-27 LAB — URINE CULTURE

## 2015-11-27 LAB — GLUCOSE, CAPILLARY
GLUCOSE-CAPILLARY: 78 mg/dL (ref 65–99)
GLUCOSE-CAPILLARY: 117 mg/dL — AB (ref 65–99)
Glucose-Capillary: 70 mg/dL (ref 65–99)

## 2015-11-27 MED ORDER — METHYLPREDNISOLONE SODIUM SUCC 125 MG IJ SOLR
250.0000 mg | Freq: Four times a day (QID) | INTRAMUSCULAR | Status: DC
Start: 2015-11-27 — End: 2015-11-29
  Administered 2015-11-27 – 2015-11-29 (×6): 250 mg via INTRAVENOUS
  Filled 2015-11-27 (×6): qty 4

## 2015-11-27 MED ORDER — SERTRALINE HCL 100 MG PO TABS
100.0000 mg | ORAL_TABLET | Freq: Every day | ORAL | Status: DC
  Administered 2015-11-28 – 2015-12-02 (×5): 100 mg via ORAL
  Filled 2015-11-27 (×5): qty 1

## 2015-11-27 MED ORDER — FAMOTIDINE 20 MG PO TABS
20.0000 mg | ORAL_TABLET | Freq: Two times a day (BID) | ORAL | Status: DC
  Administered 2015-11-27 – 2015-12-02 (×10): 20 mg via ORAL
  Filled 2015-11-27 (×10): qty 1

## 2015-11-27 NOTE — Progress Notes (Signed)
Earlier pt was crying while son was walking around room. At this time pt crying this morning.  Asking rn "where is my arm" while pt is touching her left arm with her right arm. rn placed pts right hand on her left arm and pt stated "don't be so rough". All rn did was place pts right hand on pts left arm. Pt wanted to be repositioned. rn repositioned pt by using the bed pad, rn did not touch pts left arm, but pt stated "don't pull on my left arm". rn explained she was only touching the bed pad to reposition pt.   rn set up pts meal tray, oriented pt to meal tray, fixed pts coffee. Pt cried on and off yesterday.

## 2015-11-27 NOTE — Progress Notes (Signed)
Son asked rn if pt could be on something for depression. rn explained that pt is already on an antidepressant medication and getting ativan.

## 2015-11-27 NOTE — Progress Notes (Signed)
[facility] PROGRESS NOTE  Andrea Berry [id number] DOB: October 22, 1965 DOA: 11/25/2015 PCP: Minette Brine  Assessment/Plan:   Lupus cerebritis ([place]) - Management per Neurology - Plan is to continue high-dose steroids  Depression - On zoloft. Will increase dose as family reports since new troubles patient has been more withdrawn with increased sadness.  Active Problems:   Anemia of chronic disease - improved on last check    UTI (lower urinary tract infection) - continue on rocephin and f/u with urine culture.    Hyponatremia - resolved and most likely due to poor oral intake  Code Status: full Family Communication: d/c pt and son at bedside.  Disposition Plan: pending improvement in condition   Consultants:  Neurology  Procedures:  None  Antibiotics:  Rocephin  HPI/Subjective: Patient still states that her vision has been affected by office. States she has been having spasms but is refusing acetaminophen and has asked for Dilaudid by name  Objective: Filed Vitals:   11/27/15 1111 11/27/15 1400  BP: 122/74 117/69  Pulse: 75 81  Temp: 98.8 F (37.1 C) 98.1 F (36.7 C)  Resp: 16 16    Intake/Output Summary (Last 24 hours) at 11/27/15 1628 Last data filed at 11/27/15 [id number]  Gross per 24 hour  Intake    240 ml  Output   1050 ml  Net   -810 ml   Filed Weights   11/27/15 0500  Weight: 75.099 kg (165 lb 9 oz)    Exam:   General:  Patient in no acute distress, alert and awake  Cardiovascular: S1 and S2 within normal limits  Respiratory: No increased work of breathing, equal chest rise, no wheezes  Abdomen: Soft, nondistended, nontender  Musculoskeletal: No cyanosis or clubbing  Data Reviewed: Basic Metabolic Panel:  Recent Labs Lab 11/25/15 1109 11/25/15 2348 11/26/15 0621  NA 129* 134* 136  K 3.6 4.5 4.4  CL 98* 103 106  CO2 19* 17* 18*  GLUCOSE 97 110* 81  BUN 8 9 8   CREATININE 0.56 0.66 0.54  CALCIUM 8.6* 8.8* 8.9   Liver  Function Tests:  Recent Labs Lab 11/25/15 1109 11/25/15 2348  AST 66* 62*  ALT 24 25  ALKPHOS 51 49  BILITOT 1.3* 0.7  PROT 7.7 7.2  ALBUMIN 3.9 3.5    Recent Labs Lab 11/25/15 1109  LIPASE 27   No results for input(s): AMMONIA in the last 168 hours. CBC:  Recent Labs Lab 11/25/15 1109 11/26/15 0621  WBC 3.6* 3.5*  NEUTROABS 2.6  --   HGB 10.8* 11.3*  HCT 33.0* 34.7*  MCV 73.8* 73.5*  PLT 193 284   Cardiac Enzymes: No results for input(s): CKTOTAL, CKMB, CKMBINDEX, TROPONINI in the last 168 hours. BNP (last 3 results) No results for input(s): BNP in the last 8760 hours.  ProBNP (last 3 results) No results for input(s): PROBNP in the last 8760 hours.  CBG:  Recent Labs Lab 11/27/15 0643 11/27/15 1133  GLUCAP 70 78    Recent Results (from the past 240 hour(s))  Culture, Urine     Status: None   Collection Time: 11/25/15  2:58 PM  Result Value Ref Range Status   Specimen Description Urine  Final   Special Requests NONE  Final   Culture   Final    MULTIPLE SPECIES PRESENT, SUGGEST RECOLLECTION Performed at [facility]    Report Status 11/27/2015 FINAL  Final  MRSA PCR Screening     Status: None   Collection Time:  11/26/15  4:28 AM  Result Value Ref Range Status   MRSA by PCR NEGATIVE NEGATIVE Final    Comment:        The GeneXpert MRSA Assay (FDA approved for NASAL specimens only), is one component of a comprehensive MRSA colonization surveillance program. It is not intended to diagnose MRSA infection nor to guide or monitor treatment for MRSA infections.      Studies: No results found.  Scheduled Meds: . azaTHIOprine  50 mg Oral Daily  . cefTRIAXone (ROCEPHIN)  IV  1 g Intravenous Q24H  . LORazepam  1 mg Oral 4 times per day  . methylPREDNISolone sodium succinate  250 mg Intravenous 4 times per day  . pantoprazole  40 mg Oral Daily  . pregabalin  150 mg Oral Q12H  . rivaroxaban  20 mg Oral Q supper  . [START ON 11/28/2015]  sertraline  100 mg Oral Daily   Continuous Infusions: . sodium chloride 50 mL/hr at 11/27/15 1237     Time spent: > 25 minutes    Velvet Bathe  [facility] Pager [phone]. If 7PM-7AM, please contact night-coverage at [email or web address], password [facility] 11/27/2015, 4:28 PM  LOS: 2 days

## 2015-11-27 NOTE — Consult Note (Signed)
Requesting Physician: Dr. Wendee Beavers    Reason for consultation: New onset vision loss, history of NMO HPI:                                                                                                                                         Andrea Berry is an 50 y.o. female patient with antibody positive neuro myelitis optic , who presented with new bilateral complete vision loss onset was 3 days ago. She does have chronic bladder issues has no bladder spasms, generalized weakness. She was admitted previously in January 2017 and had IVIG at that time.  Her brain MRI during this admission showed she is one enhancing lesions. After she was discharged from the hospital in January, she had not had any neurology follow-up.  Past Medical History: Past Medical History  Diagnosis Date  . Hypertension   . Blind   . Lupus ([place])   . Kidney stones   . Shortness of breath     due to medication  . Depression   . GERD (gastroesophageal reflux disease)   . Headache(784.0)   . Arthritis   . Stroke [facility]) 2008    visually impaired    Past Surgical History  Procedure Laterality Date  . Tubal ligation    . Tubal ligation    . Breast biopsy      Family History: Family History  Problem Relation Age of Onset  . Cancer Mother   . Cancer Father     Social History:   reports that she has never smoked. She has never used smokeless tobacco. She reports that she does not drink alcohol or use illicit drugs.  Allergies:  No Known Allergies   Medications:                                                                                                                         Current facility-administered medications:  .  0.9 %  sodium chloride infusion, , Intravenous, Continuous, Robbie Lis, MD, Last Rate: 50 mL/hr at 11/27/15 1237 .  acetaminophen (TYLENOL) tablet 650 mg, 650 mg, Oral, Q4H PRN, Robbie Lis, MD, 650 mg at 11/25/15 1841 .  azaTHIOprine (IMURAN) tablet 50 mg, 50 mg, Oral, Daily,  Robbie Lis, MD, 50 mg at 11/27/15 1017 .  cefTRIAXone (ROCEPHIN) 1 g in dextrose 5 % 50 mL IVPB, 1 g,  Intravenous, Q24H, Robbie Lis, MD, Last Rate: 100 mL/hr at 11/27/15 1708, 1 g at 11/27/15 1708 .  HYDROmorphone (DILAUDID) injection 1 mg, 1 mg, Intravenous, Q2H PRN, Robbie Lis, MD, 1 mg at 11/25/15 1841 .  HYDROmorphone (DILAUDID) tablet 4 mg, 4 mg, Oral, Q4H PRN, Robbie Lis, MD .  LORazepam (ATIVAN) injection 1 mg, 1 mg, Intravenous, Q4H PRN, Carmin Muskrat, MD, 1 mg at 11/25/15 1618 .  LORazepam (ATIVAN) tablet 1 mg, 1 mg, Oral, 4 times per day, Robbie Lis, MD, 1 mg at 11/27/15 1708 .  methylPREDNISolone sodium succinate (SOLU-MEDROL) 125 mg/2 mL injection 250 mg, 250 mg, Intravenous, 4 times per day, Gil Ingwersen Fuller Mandril, MD, 250 mg at 11/27/15 1646 .  ondansetron (ZOFRAN) tablet 4 mg, 4 mg, Oral, Q6H PRN **OR** ondansetron (ZOFRAN) injection 4 mg, 4 mg, Intravenous, Q6H PRN, Robbie Lis, MD .  pantoprazole (PROTONIX) EC tablet 40 mg, 40 mg, Oral, Daily, Robbie Lis, MD, 40 mg at 11/27/15 1014 .  pregabalin (LYRICA) capsule 150 mg, 150 mg, Oral, Q12H, Robbie Lis, MD, 150 mg at 11/27/15 1014 .  rivaroxaban (XARELTO) tablet 20 mg, 20 mg, Oral, Q supper, Berton Mount, RPH, 20 mg at 11/27/15 1708 .  [START ON 11/28/2015] sertraline (ZOLOFT) tablet 100 mg, 100 mg, Oral, Daily, Velvet Bathe, MD   ROS:                                                                                                                                       History    unobtainable from patient due to lack of cooperation  Neurologic Examination:                                                                                                    Today's Vitals   11/27/15 0800 11/27/15 1111 11/27/15 1400 11/27/15 1745  BP:  122/74 117/69 131/85  Pulse:  75 81 79  Temp:  98.8 F (37.1 C) 98.1 F (36.7 C) 98.8 F (37.1 C)  TempSrc:  Oral Oral Oral  Resp:  16 16 16   Weight:      SpO2:   100% 99% 98%  PainSc: 0-No pain      Patient was lying in the bed, appeared alert, followed some verbal commands. She did not wear any clothing and is covering only bedsheet. She frequently took the bedsheet off although several family members were at bedside including her son and her nephew.  His speech was fluent, no dysarthria or aphasia noted. She has very poor visual activity, and reportedly has no light perception in both eyes. Pupils were 9 mm bilaterally, very sluggishly reactive. The subacromial nerves appear intact able to demonstrate symmetric antigravity strength in all extremities with no drift. Not cooperative for resistance testing.        Lab Results: Basic Metabolic Panel:  Recent Labs Lab 11/25/15 1109 11/25/15 2348 11/26/15 0621  NA 129* 134* 136  K 3.6 4.5 4.4  CL 98* 103 106  CO2 19* 17* 18*  GLUCOSE 97 110* 81  BUN 8 9 8   CREATININE 0.56 0.66 0.54  CALCIUM 8.6* 8.8* 8.9    Liver Function Tests:  Recent Labs Lab 11/25/15 1109 11/25/15 2348  AST 66* 62*  ALT 24 25  ALKPHOS 51 49  BILITOT 1.3* 0.7  PROT 7.7 7.2  ALBUMIN 3.9 3.5    Recent Labs Lab 11/25/15 1109  LIPASE 27   No results for input(s): AMMONIA in the last 168 hours.  CBC:  Recent Labs Lab 11/25/15 1109 11/26/15 0621  WBC 3.6* 3.5*  NEUTROABS 2.6  --   HGB 10.8* 11.3*  HCT 33.0* 34.7*  MCV 73.8* 73.5*  PLT 193 284    Cardiac Enzymes: No results for input(s): CKTOTAL, CKMB, CKMBINDEX, TROPONINI in the last 168 hours.  Lipid Panel: No results for input(s): CHOL, TRIG, HDL, CHOLHDL, VLDL, LDLCALC in the last 168 hours.  CBG:  Recent Labs Lab 11/27/15 0643 11/27/15 1133 11/27/15 1641  GLUCAP 70 2 117*    Microbiology: Results for orders placed or performed during the hospital encounter of 11/25/15  Culture, Urine     Status: None   Collection Time: 11/25/15  2:58 PM  Result Value Ref Range Status   Specimen Description Urine  Final   Special Requests NONE   Final   Culture   Final    MULTIPLE SPECIES PRESENT, SUGGEST RECOLLECTION Performed at [facility]    Report Status 11/27/2015 FINAL  Final  MRSA PCR Screening     Status: None   Collection Time: 11/26/15  4:28 AM  Result Value Ref Range Status   MRSA by PCR NEGATIVE NEGATIVE Final    Comment:        The GeneXpert MRSA Assay (FDA approved for NASAL specimens only), is one component of a comprehensive MRSA colonization surveillance program. It is not intended to diagnose MRSA infection nor to guide or monitor treatment for MRSA infections.      Imaging: Mr [name] Contrast  11/25/2015  CLINICAL DATA:  50 year old female with urinary retention, possible cerebritis. Personal history of lupus and Devic's disease. Initial encounter. EXAM: MRI HEAD WITHOUT AND WITH CONTRAST TECHNIQUE: Multiplanar, multiecho pulse sequences of the brain and surrounding structures were obtained without and with intravenous contrast. CONTRAST:  38mL MULTIHANCE GADOBENATE DIMEGLUMINE 529 MG/ML IV SOLN COMPARISON:  Brain and cervical spine MRI 09/21/2015. FINDINGS: Major intracranial vascular flow voids are stable and within normal limits. Regression of abnormal T2 hyperintense signal at the cervicomedullary junction since January (series 8, image 1). Sagittal images also suggest resolution of the abnormal cervical cord expansion which was associated. No restricted diffusion to suggest acute infarction. No midline shift, mass effect, evidence of mass lesion, ventriculomegaly, extra-axial collection or acute intracranial hemorrhage. Cervicomedullary junction and pituitary are within normal limits. New Patchy and confluent posterior superior cerebral white matter FLAIR hyperintensity (series 9, image 15). While at the same time the posterior left  thalamic signal abnormality seen in January has regressed. The new changes extend on the left to the posterior limb of the left internal capsule (series 8, image 13,  and on the right to the frontal operculum. While superiorly the changes on the left extend to the subcortical left sensory strip white matter, and on the right the subcortical right motor strip white matter. There is mild associated patchy enhancement in the left hemispheric white matter abnormality, no associated enhancement in the right hemisphere. No dural thickening. No other abnormal intracranial enhancement. No other new signal abnormality. Visible internal auditory structures appear normal. Mastoids are clear. Minimal paranasal sinus mucosal thickening has not significantly changed. Grossly normal orbit soft tissues. Negative scalp soft tissues. Visualized bone marrow signal is within normal limits. IMPRESSION: 1. Acute patchy and confluent bilateral posterior hemisphere white matter FLAIR hyperintensity, with patchy associated enhancement on the left. No associated acute infarct. This new signal abnormality could reflect acute demyelinating disease or lupus related cerebritis in this setting. 2. At the same time the previously-seen abnormal signal in the left thalamus and cervicomedullary junction has regressed since January. Cervical cord signal is not otherwise evaluated, but sagittal images of the brain suggests to the cord swelling has also resolved. Electronically Signed   By: Genevie Ann M.D.   On: 11/25/2015 12:37      Assessment and plan:   NAKYA YONKO is an 50 y.o. female patient who presented with new onset of bilateral vision loss with no light perception, onset 3 days ago per history from patient. Given the history of neuromyelitis optica, suspect this could be an acute severe bilateral optic neuritis causing the vision loss. Brain MRI done shows a few small enhancing lesions which does not explain her vision loss. Recommend high dose of IV Solu-Medrol 250 mg every 6 hours. If she does not respond to high-dose IV steroids, then may pursue further immunomodulatory therapies such as  plasmapheresis or IVIG.  She will need follow-up with MS specialist at a tertiary care center such as at [facility] or [facility] to initiate immunomodulatory therapy for neuromyelitis optica.  Discussed about her current assessment, treatment options with patient, and her son at bedside. Answered several of their questions.   We'll follow-up

## 2015-11-27 NOTE — Progress Notes (Signed)
Pt requesting something for pain, rn asked md, md stated pt could have tylenol but nothing stronger bc md wanted pt to be alert when neurologist came to see pt. Pt refused the tylenol when rn offered.

## 2015-11-27 NOTE — Progress Notes (Signed)
Pt since yesterday refusing to wear a gown, completely naked with blanket on top of her. Pt did agree to let rn place SCDs on.

## 2015-11-27 NOTE — Progress Notes (Signed)
Pt states SCDs squeeze her legs to much, rn removed SCDs

## 2015-11-28 LAB — GLUCOSE, CAPILLARY
GLUCOSE-CAPILLARY: 109 mg/dL — AB (ref 65–99)
GLUCOSE-CAPILLARY: 135 mg/dL — AB (ref 65–99)
Glucose-Capillary: 177 mg/dL — ABNORMAL HIGH (ref 65–99)

## 2015-11-28 MED ORDER — SENNA 8.6 MG PO TABS
1.0000 | ORAL_TABLET | Freq: Every day | ORAL | Status: DC | PRN
  Administered 2015-11-28: 8.6 mg via ORAL
  Filled 2015-11-28: qty 1

## 2015-11-28 MED ORDER — CYCLOBENZAPRINE HCL 10 MG PO TABS
5.0000 mg | ORAL_TABLET | Freq: Every day | ORAL | Status: DC | PRN
  Administered 2015-11-28 – 2015-11-30 (×3): 5 mg via ORAL
  Filled 2015-11-28 (×3): qty 1

## 2015-11-28 NOTE — Care Management Note (Signed)
Case Management Note  Patient Details  Name: Andrea Berry MRN: [id number] Date of Birth: Dec 22, 1965  Subjective/Objective:                    Action/Plan: Patient was admitted with new onset vision loss.  Was at [place] [facility] prior to admission. CSW aware. Will follow for discharge needs pending PT/OT evals and physician orders.  Expected Discharge Date:                  Expected Discharge Plan:  Skilled Nursing Facility  In-House Referral:  Clinical Social Work  Discharge planning Services     Post Acute Care Choice:    Choice offered to:     DME Arranged:    DME Agency:     HH Arranged:    [place] Agency:     Status of Service:  In process, will continue to follow  Medicare Important Message Given:    Date Medicare IM Given:    Medicare IM give by:    Date Additional Medicare IM Given:    Additional Medicare Important Message give by:     If discussed at [place] of Stay Meetings, dates discussed:    Additional CommentsRolm Baptise, RN 11/28/2015, 11:12 AM [phone]

## 2015-11-28 NOTE — Progress Notes (Signed)
rn has offered pt tylenol twice today for pain, pt has refused tylenol both times.

## 2015-11-28 NOTE — Progress Notes (Signed)
Pt reports she has not had a bowel movement in a week. Bowel sounds present. Pt reports she is passing gas. Will page md and ask if pt can have something for constipation.

## 2015-11-28 NOTE — Progress Notes (Signed)
rn assisted pt to call her son on her cell phone. rn offered to set up pts meal for her, pt wants to eat her food later.

## 2015-11-28 NOTE — Progress Notes (Signed)
rn assisted pt to call pts son on pts cell phone.

## 2015-11-28 NOTE — Progress Notes (Addendum)
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 - completed 3 days treatment with rocephin. U/C negative.    Hyponatremia - resolved and most likely due to poor oral intake  Code Status: full Family Communication: d/c pt and son at bedside.  Disposition Plan: pending improvement in condition   Consultants:  Neurology  Procedures:  None  Antibiotics:  Rocephin  HPI/Subjective: Patient has no new complaints. No acute issues overnight.  Objective: Filed Vitals:   11/28/15 0234 11/28/15 0550  BP: 153/90 146/72  Pulse: 62 66  Temp: 98 F (36.7 C) 98 F (36.7 C)  Resp: 18 18    Intake/Output Summary (Last 24 hours) at 11/28/15 1508 Last data filed at 11/28/15 0859  Gross per 24 hour  Intake    240 ml  Output   1975 ml  Net  -1735 ml   Filed Weights   11/27/15 0500 11/28/15 0500  Weight: 75.099 kg (165 lb 9 oz) 76.913 kg (169 lb 9 oz)    Exam:   General:  Patient in no acute distress, alert and awake  Cardiovascular: S1 and S2 within normal limits  Respiratory: No increased work of breathing, equal chest rise, no wheezes  Abdomen: Soft, nondistended, nontender  Musculoskeletal: No cyanosis or clubbing  Data Reviewed: Basic Metabolic Panel:  Recent Labs Lab 11/25/15 1109 11/25/15 2348 11/26/15 0621  NA 129* 134* 136  K 3.6 4.5 4.4  CL 98* 103 106  CO2 19* 17* 18*  GLUCOSE 97 110* 81   BUN 8 9 8   CREATININE 0.56 0.66 0.54  CALCIUM 8.6* 8.8* 8.9   Liver Function Tests:  Recent Labs Lab 11/25/15 1109 11/25/15 2348  AST 66* 62*  ALT 24 25  ALKPHOS 51 49  BILITOT 1.3* 0.7  PROT 7.7 7.2  ALBUMIN 3.9 3.5    Recent Labs Lab 11/25/15 1109  LIPASE 27   No results for input(s): AMMONIA in the last 168 hours. CBC:  Recent Labs Lab 11/25/15 1109 11/26/15 0621  WBC 3.6* 3.5*  NEUTROABS 2.6  --   HGB 10.8* 11.3*  HCT 33.0* 34.7*  MCV 73.8* 73.5*  PLT 193 284   Cardiac Enzymes: No results for input(s): CKTOTAL, CKMB, CKMBINDEX, TROPONINI in the last 168 hours. BNP (last 3 results) No results for input(s): BNP in the last 8760 hours.  ProBNP (last 3 results) No results for input(s): PROBNP in the last 8760 hours.  CBG:  Recent Labs Lab 11/27/15 0643 11/27/15 1133 11/27/15 1641 11/28/15 0650 11/28/15 1142  GLUCAP 70 78 117* 109* 135*    Recent Results (from the past 240 hour(s))  Culture, Urine     Status: None   Collection Time: 11/25/15  2:58 PM  Result Value Ref Range Status   Specimen Description Urine  Final   Special Requests NONE  Final   Culture   Final    MULTIPLE SPECIES PRESENT, SUGGEST RECOLLECTION Performed at [facility]    Report  Status 11/27/2015 FINAL  Final  MRSA PCR Screening     Status: None   Collection Time: 11/26/15  4:28 AM  Result Value Ref Range Status   MRSA by PCR NEGATIVE NEGATIVE Final    Comment:        The GeneXpert MRSA Assay (FDA approved for NASAL specimens only), is one component of a comprehensive MRSA colonization surveillance program. It is not intended to diagnose MRSA infection nor to guide or monitor treatment for MRSA infections.      Studies: No results found.  Scheduled Meds: . azaTHIOprine  50 mg Oral Daily  . cefTRIAXone (ROCEPHIN)  IV  1 g Intravenous Q24H  . famotidine  20 mg Oral BID  . LORazepam  1 mg Oral 4 times per day  . methylPREDNISolone sodium succinate   250 mg Intravenous 4 times per day  . pantoprazole  40 mg Oral Daily  . pregabalin  150 mg Oral Q12H  . rivaroxaban  20 mg Oral Q supper  . sertraline  100 mg Oral Daily   Continuous Infusions: . sodium chloride 50 mL/hr at 11/28/15 1034     Time spent: > 25 minutes    Velvet Bathe  [facility] Pager [phone]. If 7PM-7AM, please contact night-coverage at [email or web address], password [facility] 11/28/2015, 3:08 PM  LOS: 3 days     Addendum: Patient complaining of constipation and muscle spasms. Will provide senna as needed for constipation and Flexeril as needed daily for muscle spasms.

## 2015-11-28 NOTE — Progress Notes (Addendum)
Pt given orange sherbert per pt request.   Visitor at bedside using flat iron/ curling iron to fix pts hair. rn informed visitor that rn was unsure about policy on outside heated electric devices. Visitor finished using flat iron/ curing iron and unplugged device.

## 2015-11-29 DIAGNOSIS — H469 Unspecified optic neuritis: Secondary | ICD-10-CM

## 2015-11-29 LAB — GLUCOSE, CAPILLARY
GLUCOSE-CAPILLARY: 125 mg/dL — AB (ref 65–99)
GLUCOSE-CAPILLARY: 110 mg/dL — AB (ref 65–99)
Glucose-Capillary: 143 mg/dL — ABNORMAL HIGH (ref 65–99)
Glucose-Capillary: 129 mg/dL — ABNORMAL HIGH (ref 65–99)

## 2015-11-29 MED ORDER — SENNA 8.6 MG PO TABS
2.0000 | ORAL_TABLET | Freq: Every day | ORAL | Status: DC
  Administered 2015-11-29 – 2015-12-01 (×3): 17.2 mg via ORAL
  Filled 2015-11-29 (×3): qty 2

## 2015-11-29 MED ORDER — METHYLPREDNISOLONE SODIUM SUCC 125 MG IJ SOLR
500.0000 mg | Freq: Two times a day (BID) | INTRAMUSCULAR | Status: DC
Start: 2015-11-29 — End: 2015-11-29

## 2015-11-29 MED ORDER — SODIUM CHLORIDE 0.9 % IV SOLN
500.0000 mg | Freq: Two times a day (BID) | INTRAVENOUS | Status: AC
  Administered 2015-11-29 – 2015-12-01 (×5): 500 mg via INTRAVENOUS
  Filled 2015-11-29 (×5): qty 4

## 2015-11-29 MED ORDER — BISACODYL 10 MG RE SUPP
10.0000 mg | Freq: Every day | RECTAL | Status: DC | PRN
  Administered 2015-11-29 – 2015-12-01 (×2): 10 mg via RECTAL
  Filled 2015-11-29 (×3): qty 1

## 2015-11-29 MED ORDER — CYCLOBENZAPRINE HCL 10 MG PO TABS: 5.0000 mg | ORAL_TABLET | Freq: Once | ORAL | Status: DC

## 2015-11-29 NOTE — NC FL2 (Signed)
[place] LEVEL OF CARE SCREENING TOOL     IDENTIFICATION  Patient Name: Andrea Berry Birthdate: 1965/09/13 Sex: female Admission Date (Current Location): 11/25/2015  [place] and [place] Number:  Andrea Berry [id number] [name] and Address:  [facility] [place], [place] [address], [place], [place] [address]      Provider Number: [id number]  Attending Physician Name and Address:  Velvet Bathe, MD  Relative Name and Phone Number:   [name] ; son- [phone])    Current Level of Care: Hospital Recommended Level of Care: [place] Prior Approval Number:    Date Approved/Denied:   PASRR Number:    Discharge Plan: SNF    Current Diagnoses: Patient Active Problem List   Diagnosis Date Noted  . Lupus cerebritis ([place]) 11/25/2015  . UTI (lower urinary tract infection) 11/25/2015  . Hyponatremia 11/25/2015  . Anemia of chronic disease 10/28/2015    Orientation RESPIRATION BLADDER Height & Weight     Self, Time, Situation, Place  Normal Indwelling catheter Weight: 169 lb 9 oz (76.913 kg) Height:  5\' 1"  (154.9 cm)  BEHAVIORAL SYMPTOMS/MOOD NEUROLOGICAL BOWEL NUTRITION STATUS      Incontinent Diet (HEART HEALTHY)  AMBULATORY STATUS COMMUNICATION OF NEEDS Skin       Normal                       Personal Care Assistance Level of Assistance              Functional Limitations Info             SPECIAL CARE FACTORS FREQUENCY                       Contractures      Additional Factors Info  Code Status, Allergies Code Status Info:  (FULL) Allergies Info:  (NO KNOWN ALLERGIES)           Current Medications (11/29/2015):  This is the current hospital active medication list Current Facility-Administered Medications  Medication Dose Route Frequency Provider Last Rate Last Dose  . 0.9 %  sodium chloride infusion   Intravenous Continuous Robbie Lis, MD 50 mL/hr at 11/29/15 [phone]    . acetaminophen  (TYLENOL) tablet 650 mg  650 mg Oral Q4H PRN Robbie Lis, MD   650 mg at 11/25/15 1841  . azaTHIOprine (IMURAN) tablet 50 mg  50 mg Oral Daily Robbie Lis, MD   50 mg at 11/29/15 0915  . cyclobenzaprine (FLEXERIL) tablet 5 mg  5 mg Oral Daily PRN Velvet Bathe, MD   5 mg at 11/28/15 1642  . famotidine (PEPCID) tablet 20 mg  20 mg Oral BID Ram Fuller Mandril, MD   20 mg at 11/29/15 0915  . HYDROmorphone (DILAUDID) injection 1 mg  1 mg Intravenous Q2H PRN Robbie Lis, MD   1 mg at 11/25/15 1841  . HYDROmorphone (DILAUDID) tablet 4 mg  4 mg Oral Q4H PRN Robbie Lis, MD      . LORazepam (ATIVAN) injection 1 mg  1 mg Intravenous Q4H PRN Carmin Muskrat, MD   1 mg at 11/25/15 1618  . LORazepam (ATIVAN) tablet 1 mg  1 mg Oral 4 times per day Robbie Lis, MD   1 mg at 11/29/15 0544  . methylPREDNISolone sodium succinate (SOLU-MEDROL) 125 mg/2 mL injection 250 mg  250 mg Intravenous 4 times per day Ram Fuller Mandril, MD  250 mg at 11/29/15 0544  . ondansetron (ZOFRAN) tablet 4 mg  4 mg Oral Q6H PRN Robbie Lis, MD       Or  . ondansetron [facility]) injection 4 mg  4 mg Intravenous Q6H PRN Robbie Lis, MD      . pantoprazole (PROTONIX) EC tablet 40 mg  40 mg Oral Daily Robbie Lis, MD   40 mg at 11/29/15 0915  . pregabalin (LYRICA) capsule 150 mg  150 mg Oral Q12H Robbie Lis, MD   150 mg at 11/29/15 0915  . rivaroxaban (XARELTO) tablet 20 mg  20 mg Oral Q supper Berton Mount, RPH   20 mg at 11/28/15 1642  . senna (SENOKOT) tablet 8.6 mg  1 tablet Oral Daily PRN Velvet Bathe, MD   8.6 mg at 11/28/15 1642  . sertraline (ZOLOFT) tablet 100 mg  100 mg Oral Daily Velvet Bathe, MD   100 mg at 11/29/15 0915     Discharge Medications: Please see discharge summary for a list of discharge medications.  Relevant Imaging Results:  Relevant Lab Results:   Additional Information  (SSN: [id number])  Leane Call, Student-SW [phone]

## 2015-11-29 NOTE — Care Management Important Message (Signed)
Important Message  Patient Details  Name: Andrea Berry MRN: [id number] Date of Birth: 1965/10/27   Medicare Important Message Given:  Yes    Ermal Haberer P [place] 11/29/2015, 12:01 PM

## 2015-11-29 NOTE — Progress Notes (Signed)
[facility] PROGRESS NOTE  Andrea Berry [id number] DOB: December 06, 1965 DOA: 11/25/2015 PCP: Minette Brine  Brief Narrative: Patient is a 50 year old with history of SLE and depression presenting with lupus cerebritis. Neurology on board and currently managing  Assessment/Plan:   Lupus cerebritis ([place]) - Management per Neurology - Plan is to continue high-dose steroids (250 mg IV q 6 hours) patient does not respond to high-dose IV dose steroids and neurologists considering pursuing further immunomodulatory therapies such as plasmapheresis or IVIG  Depression - Increased dose of Zoloft 3/19  Active Problems:   Anemia of chronic disease - improved on last check    UTI (lower urinary tract infection) - completed 3 days treatment with rocephin. U/C negative.    Hyponatremia - resolved and most likely due to poor oral intake  Code Status: full Family Communication: d/c pt and son at bedside.  Disposition Plan: Once patient is ready for discharge from neurology perspective   Consultants:  Neurology  Procedures:  None  Antibiotics:  Rocephin  HPI/Subjective: Patient has no new complaints. No acute issues overnight.  Objective: Filed Vitals:   11/29/15 1016 11/29/15 1417  BP: 109/58 110/60  Pulse: 73 75  Temp: 98.6 F (37 C) 98 F (36.7 C)  Resp: 16 18    Intake/Output Summary (Last 24 hours) at 11/29/15 1747 Last data filed at 11/29/15 1726  Gross per 24 hour  Intake 5002.5 ml  Output    850 ml  Net 4152.5 ml   Filed Weights   11/27/15 0500 11/28/15 0500  Weight: 75.099 kg (165 lb 9 oz) 76.913 kg (169 lb 9 oz)    Exam:   General:  Patient in no acute distress, alert and awake  Cardiovascular: S1 and S2 within normal limits  Respiratory: No increased work of breathing, equal chest rise, no wheezes  Abdomen: Soft, nondistended, nontender  Musculoskeletal: No cyanosis or clubbing  Data Reviewed: Basic Metabolic Panel:  Recent Labs Lab  11/25/15 1109 11/25/15 2348 11/26/15 0621  NA 129* 134* 136  K 3.6 4.5 4.4  CL 98* 103 106  CO2 19* 17* 18*  GLUCOSE 97 110* 81  BUN 8 9 8   CREATININE 0.56 0.66 0.54  CALCIUM 8.6* 8.8* 8.9   Liver Function Tests:  Recent Labs Lab 11/25/15 1109 11/25/15 2348  AST 66* 62*  ALT 24 25  ALKPHOS 51 49  BILITOT 1.3* 0.7  PROT 7.7 7.2  ALBUMIN 3.9 3.5    Recent Labs Lab 11/25/15 1109  LIPASE 27   No results for input(s): AMMONIA in the last 168 hours. CBC:  Recent Labs Lab 11/25/15 1109 11/26/15 0621  WBC 3.6* 3.5*  NEUTROABS 2.6  --   HGB 10.8* 11.3*  HCT 33.0* 34.7*  MCV 73.8* 73.5*  PLT 193 284   Cardiac Enzymes: No results for input(s): CKTOTAL, CKMB, CKMBINDEX, TROPONINI in the last 168 hours. BNP (last 3 results) No results for input(s): BNP in the last 8760 hours.  ProBNP (last 3 results) No results for input(s): PROBNP in the last 8760 hours.  CBG:  Recent Labs Lab 11/28/15 1651 11/28/15 2132 11/29/15 0656 11/29/15 1148 11/29/15 1649  GLUCAP 177* 125* 110* 143* 129*    Recent Results (from the past 240 hour(s))  Culture, Urine     Status: None   Collection Time: 11/25/15  2:58 PM  Result Value Ref Range Status   Specimen Description Urine  Final   Special Requests NONE  Final   Culture   Final  MULTIPLE SPECIES PRESENT, SUGGEST RECOLLECTION Performed at [facility]    Report Status 11/27/2015 FINAL  Final  MRSA PCR Screening     Status: None   Collection Time: 11/26/15  4:28 AM  Result Value Ref Range Status   MRSA by PCR NEGATIVE NEGATIVE Final    Comment:        The GeneXpert MRSA Assay (FDA approved for NASAL specimens only), is one component of a comprehensive MRSA colonization surveillance program. It is not intended to diagnose MRSA infection nor to guide or monitor treatment for MRSA infections.      Studies: No results found.  Scheduled Meds: . azaTHIOprine  50 mg Oral Daily  . famotidine  20 mg Oral  BID  . LORazepam  1 mg Oral 4 times per day  . methylPREDNISolone (SOLU-MEDROL) injection  500 mg Intravenous Q12H  . pantoprazole  40 mg Oral Daily  . pregabalin  150 mg Oral Q12H  . rivaroxaban  20 mg Oral Q supper  . sertraline  100 mg Oral Daily   Continuous Infusions: . sodium chloride 50 mL/hr at 11/29/15 0926     Time spent: > 25 minutes    Velvet Bathe  [facility] Pager [phone]. If 7PM-7AM, please contact night-coverage at [email or web address], password [facility] 11/29/2015, 5:47 PM  LOS: 4 days     Addendum: Patient complaining of constipation and muscle spasms. Will provide senna as needed for constipation and Flexeril as needed daily for muscle spasms.

## 2015-11-29 NOTE — Progress Notes (Signed)
Notified MD of LBM and need constipation medications at this time. Will administered once order. Family aware.   Melany Wiesman, RN.

## 2015-11-29 NOTE — Progress Notes (Signed)
Subjective: Able to see shadows today but still unable to perceive bright light or count fingers.    Exam: Filed Vitals:   11/29/15 0508 11/29/15 1016  BP: 126/80 109/58  Pulse: 58 73  Temp: 97.9 F (36.6 C) 98.6 F (37 C)  Resp: 16 16    HEENT-  Normocephalic, no lesions, without obvious abnormality.  Normal external eye and conjunctiva.  Normal TM's bilaterally.  Normal auditory canals and external ears. Normal external nose, mucus membranes and septum.  Normal pharynx. Cardiovascular- S1, S2 normal, pulses palpable throughout   Lungs- chest clear, no wheezing, rales, normal symmetric air entry Abdomen- normal findings: bowel sounds normal Extremities- no edema Lymph-no adenopathy palpable Musculoskeletal-no joint tenderness, deformity or swelling Skin-warm and dry, no hyperpigmentation, vitiligo, or suspicious lesions    Gen: In bed, NAD MS: alert and oriented, able to follow commands. Speech clear.  CN: EOMI, TML, face symmetrical. Pupil 6 mm and non-reactive. Able to see shadows but unable to count fingers.  Motor: left arm and leg is hemiplegic with full strength on right arm and leg.  Sensory: normal sensation on the right arm and allodynia of left arm and leg.    Pertinent Labs: none  Etta Quill PA-C [facility] [phone]  Impression: bilateral vision loss 5 days prior. Given the history of neuromyelitis optica, suspect this could be an acute severe bilateral optic neuritis causing the vision loss. Brain MRI done shows a few small enhancing lesions which does not explain her vision loss.  Currently on Solumedrol with some improvement.    Recommendations: 1) received 3/10 doses. Changed to 500 mg BID for easier dosing. Will continue Solumedrol.     11/29/2015, 10:28 AM

## 2015-11-29 NOTE — Clinical Social Work Note (Signed)
Clinical Social Work Assessment  Patient Details  Name: Andrea Berry MRN: [id number] Date of Birth: 12/16/1965  Date of referral:  11/29/15               Reason for consult:  Discharge Planning                Permission sought to share information with:  Family Supports Permission granted to share information::  Yes, Verbal Permission Granted  Name::      [name])  Agency::   (SNF)  Relationship::   (son)  Contact Information:   [phone])  Housing/Transportation Living arrangements for the past 2 months:  [place] of Information:  Patient Patient Interpreter Needed:  None Criminal Activity/Legal Involvement Pertinent to Current Situation/Hospitalization:  No - Comment as needed Significant Relationships:  Adult Children, Siblings Lives with:  Adult Children Do you feel safe going back to the place where you live?  No Need for family participation in patient care:  Yes (Comment)  Care giving concerns:   Patient has not expressed any care giving concerns at this time.   Social Worker assessment / plan:   Patient a/o x4. However, appeared to be very lethargic at time of BSW intern assessment. BSW intern has met with patient to discuss post-discharge plan. Patient has verified with BSW intern that she is a short -term resident at [facility]. Patient has been at this facility since January of 2017 (patient could not remember exact date of admission). Patient informed BSW intern that she lives at home with son, [name] who was also at bedside. Patient has given BSW intern verbal permission to fax updated clinical back to [facility]. BSW intern to continue to f/u with patient regarding scheduled discharge as well as transportation arrangement once medically cleared by MD. Arita Miss intern remains available.  Employment status:  Unemployed Forensic scientist:  Medicaid [place] PT Recommendations:  [place] /  Referral to community resources:  [place]  Patient/Family's Response to care:   Patient is agreeable to returning back to [facility] once medically stable.   Patient/Family's Understanding of and Emotional Response to Diagnosis, Current Treatment, and Prognosis:   Patient understood need for further rehab at SNF. Patient is hopeful to eventually returning to residence independently.  Emotional Assessment Appearance:  Appears stated age Attitude/Demeanor/Rapport:  Lethargic Affect (typically observed):  Flat, Blunt Orientation:  Oriented to Self, Oriented to Place, Oriented to  Time, Oriented to Situation Alcohol / Substance use:  Not Applicable Psych involvement (Current and /or in the community):  No (Comment)  Discharge Needs  Concerns to be addressed:  No discharge needs identified Readmission within the last 30 days:  No Current discharge risk:  None Barriers to Discharge:  No Barriers Identified   Leane Call, Student-SW 11/29/2015, 10:12 AM

## 2015-11-30 LAB — GLUCOSE, CAPILLARY
GLUCOSE-CAPILLARY: 150 mg/dL — AB (ref 65–99)
Glucose-Capillary: 123 mg/dL — ABNORMAL HIGH (ref 65–99)

## 2015-11-30 NOTE — Progress Notes (Signed)
[facility] PROGRESS NOTE  Andrea Berry [id number] DOB: [birth year] DOA: 11/25/2015 PCP: Minette Brine  Brief Narrative: Patient is a 50 year old with history of SLE and depression presenting with lupus cerebritis. Neurology on board and currently managing  Assessment/Plan:   Lupus cerebritis ([place]) - Management per Neurology - Plan is to continue high-dose steroids (250 mg IV q 6 hours) patient does not respond to high-dose IV dose steroids and neurologists considering pursuing further immunomodulatory therapies such as plasmapheresis or IVIG  Depression - Increased dose of Zoloft 3/19  Active Problems:   Anemia of chronic disease - improved on last check    UTI (lower urinary tract infection) - completed 3 days treatment with rocephin. U/C negative.    Hyponatremia - resolved and most likely due to poor oral intake  Code Status: full Family Communication: d/c pt and son at bedside.  Disposition Plan: Once patient is ready for discharge from neurology perspective   Consultants:  Neurology  Procedures:  None  Antibiotics:  Rocephin  HPI/Subjective: Patient had niece in the room who was updated per patient's request. No new problems otherwise reported  Objective: Filed Vitals:   11/30/15 0629 11/30/15 1001  BP: 139/78 108/65  Pulse: 53 62  Temp: 97.4 F (36.3 C) 98.1 F (36.7 C)  Resp: 16 20    Intake/Output Summary (Last 24 hours) at 11/30/15 1335 Last data filed at 11/30/15 1200  Gross per 24 hour  Intake     50 ml  Output   1200 ml  Net  -1150 ml   Filed Weights   11/27/15 0500 11/28/15 0500 11/30/15 0319  Weight: 75.099 kg (165 lb 9 oz) 76.913 kg (169 lb 9 oz) 77.111 kg (170 lb)    Exam:   General:  Patient in no acute distress, alert and awake  Cardiovascular: S1 and S2 within normal limits  Respiratory: No increased work of breathing, equal chest rise, no wheezes  Abdomen: Soft, nondistended, nontender  Musculoskeletal: No  cyanosis or clubbing  Data Reviewed: Basic Metabolic Panel:  Recent Labs Lab 11/25/15 1109 11/25/15 2348 11/26/15 0621  NA 129* 134* 136  K 3.6 4.5 4.4  CL 98* 103 106  CO2 19* 17* 18*  GLUCOSE 97 110* 81  BUN 8 9 8   CREATININE 0.56 0.66 0.54  CALCIUM 8.6* 8.8* 8.9   Liver Function Tests:  Recent Labs Lab 11/25/15 1109 11/25/15 2348  AST 66* 62*  ALT 24 25  ALKPHOS 51 49  BILITOT 1.3* 0.7  PROT 7.7 7.2  ALBUMIN 3.9 3.5    Recent Labs Lab 11/25/15 1109  LIPASE 27   No results for input(s): AMMONIA in the last 168 hours. CBC:  Recent Labs Lab 11/25/15 1109 11/26/15 0621  WBC 3.6* 3.5*  NEUTROABS 2.6  --   HGB 10.8* 11.3*  HCT 33.0* 34.7*  MCV 73.8* 73.5*  PLT 193 284   Cardiac Enzymes: No results for input(s): CKTOTAL, CKMB, CKMBINDEX, TROPONINI in the last 168 hours. BNP (last 3 results) No results for input(s): BNP in the last 8760 hours.  ProBNP (last 3 results) No results for input(s): PROBNP in the last 8760 hours.  CBG:  Recent Labs Lab 11/29/15 0656 11/29/15 1148 11/29/15 1649 11/29/15 2217 11/30/15 0630  GLUCAP 110* 143* 129* 150* 123*    Recent Results (from the past 240 hour(s))  Culture, Urine     Status: None   Collection Time: 11/25/15  2:58 PM  Result Value Ref Range Status   Specimen  Description Urine  Final   Special Requests NONE  Final   Culture   Final    MULTIPLE SPECIES PRESENT, SUGGEST RECOLLECTION Performed at [facility]    Report Status 11/27/2015 FINAL  Final  MRSA PCR Screening     Status: None   Collection Time: 11/26/15  4:28 AM  Result Value Ref Range Status   MRSA by PCR NEGATIVE NEGATIVE Final    Comment:        The GeneXpert MRSA Assay (FDA approved for NASAL specimens only), is one component of a comprehensive MRSA colonization surveillance program. It is not intended to diagnose MRSA infection nor to guide or monitor treatment for MRSA infections.      Studies: No results  found.  Scheduled Meds: . azaTHIOprine  50 mg Oral Daily  . cyclobenzaprine  5 mg Oral Once  . famotidine  20 mg Oral BID  . LORazepam  1 mg Oral 4 times per day  . methylPREDNISolone (SOLU-MEDROL) injection  500 mg Intravenous Q12H  . pantoprazole  40 mg Oral Daily  . pregabalin  150 mg Oral Q12H  . rivaroxaban  20 mg Oral Q supper  . senna  2 tablet Oral QHS  . sertraline  100 mg Oral Daily   Continuous Infusions: . sodium chloride 50 mL/hr at 11/29/15 0926     Time spent: > 25 minutes    Velvet Bathe  [facility] Pager [phone]. If 7PM-7AM, please contact night-coverage at [email or web address], password [facility] 11/30/2015, 1:35 PM  LOS: 5 days     Addendum: Patient complaining of constipation and muscle spasms. Will provide senna as needed for constipation and Flexeril as needed daily for muscle spasms.

## 2015-12-01 DIAGNOSIS — K5900 Constipation, unspecified: Secondary | ICD-10-CM

## 2015-12-01 LAB — GLUCOSE, CAPILLARY
GLUCOSE-CAPILLARY: 126 mg/dL — AB (ref 65–99)
GLUCOSE-CAPILLARY: 123 mg/dL — AB (ref 65–99)
GLUCOSE-CAPILLARY: 260 mg/dL — AB (ref 65–99)
Glucose-Capillary: 167 mg/dL — ABNORMAL HIGH (ref 65–99)
Glucose-Capillary: 116 mg/dL — ABNORMAL HIGH (ref 65–99)

## 2015-12-01 NOTE — Clinical Social Work Note (Signed)
CSW continuing to follow patient's progress, patient is from [facility], and plan is to return to SNF.  Jones Broom. Riley, MSW, [place] 12/01/2015 5:19 PM

## 2015-12-01 NOTE — Progress Notes (Signed)
[facility] PROGRESS NOTE  Andrea HRABOVSKY [id number] DOB: 1965/11/28 DOA: 11/25/2015 PCP: Minette Brine  Brief Narrative: Patient is a 50 year old with history of SLE and depression presenting with lupus cerebritis. Neurology on board and currently managing  Assessment/Plan:   Lupus cerebritis ([place]) - Management per Neurology - Plan is to continue high-dose steroids (250 mg IV q 6 hours) per last neurology note today is the last day of steroids. As such we'll start discharge planning and consult physical therapy. - Neurology suspecting BL optic neuritis  Depression - Increased dose of Zoloft 3/19  Constipation - on senna and dulcolax suppository  Active Problems:   Anemia of chronic disease - improved on last check    UTI (lower urinary tract infection) - completed 3 days treatment with rocephin. U/C negative.    Hyponatremia - resolved and most likely due to poor oral intake  Code Status: full Family Communication: d/c pt and son at bedside.  Disposition Plan: start discharge planning as patient after today will be done with steroid regimen. Pending recommendations from physical therapy   Consultants:  Neurology  Procedures:  None  Antibiotics:  Rocephin  HPI/Subjective: Patient has no new complaints. States that she is able to see shadows now which was something she was unable to do before.  Objective: Filed Vitals:   12/01/15 0114 12/01/15 0540  BP: 143/77 160/88  Pulse: 65   Temp: 98.5 F (36.9 C) 97.8 F (36.6 C)  Resp: 16    No intake or output data in the 24 hours ending 12/01/15 1356 Filed Weights   11/28/15 0500 11/30/15 0319 12/01/15 0309  Weight: 76.913 kg (169 lb 9 oz) 77.111 kg (170 lb) 78.563 kg (173 lb 3.2 oz)    Exam:   General:  Patient in no acute distress, alert and awake  Cardiovascular: S1 and S2 within normal limits  Respiratory: No increased work of breathing, equal chest rise, no wheezes  Abdomen: Soft,  nondistended, nontender  Musculoskeletal: No cyanosis or clubbing  Data Reviewed: Basic Metabolic Panel:  Recent Labs Lab 11/25/15 1109 11/25/15 2348 11/26/15 0621  NA 129* 134* 136  K 3.6 4.5 4.4  CL 98* 103 106  CO2 19* 17* 18*  GLUCOSE 97 110* 81  BUN 8 9 8   CREATININE 0.56 0.66 0.54  CALCIUM 8.6* 8.8* 8.9   Liver Function Tests:  Recent Labs Lab 11/25/15 1109 11/25/15 2348  AST 66* 62*  ALT 24 25  ALKPHOS 51 49  BILITOT 1.3* 0.7  PROT 7.7 7.2  ALBUMIN 3.9 3.5    Recent Labs Lab 11/25/15 1109  LIPASE 27   No results for input(s): AMMONIA in the last 168 hours. CBC:  Recent Labs Lab 11/25/15 1109 11/26/15 0621  WBC 3.6* 3.5*  NEUTROABS 2.6  --   HGB 10.8* 11.3*  HCT 33.0* 34.7*  MCV 73.8* 73.5*  PLT 193 284   Cardiac Enzymes: No results for input(s): CKTOTAL, CKMB, CKMBINDEX, TROPONINI in the last 168 hours. BNP (last 3 results) No results for input(s): BNP in the last 8760 hours.  ProBNP (last 3 results) No results for input(s): PROBNP in the last 8760 hours.  CBG:  Recent Labs Lab 11/29/15 2217 11/30/15 0630 11/30/15 2123 12/01/15 0645 12/01/15 1151  GLUCAP 150* 123* 126* 123* 167*    Recent Results (from the past 240 hour(s))  Culture, Urine     Status: None   Collection Time: 11/25/15  2:58 PM  Result Value Ref Range Status   Specimen  Description Urine  Final   Special Requests NONE  Final   Culture   Final    MULTIPLE SPECIES PRESENT, SUGGEST RECOLLECTION Performed at [facility]    Report Status 11/27/2015 FINAL  Final  MRSA PCR Screening     Status: None   Collection Time: 11/26/15  4:28 AM  Result Value Ref Range Status   MRSA by PCR NEGATIVE NEGATIVE Final    Comment:        The GeneXpert MRSA Assay (FDA approved for NASAL specimens only), is one component of a comprehensive MRSA colonization surveillance program. It is not intended to diagnose MRSA infection nor to guide or monitor treatment for MRSA  infections.      Studies: No results found.  Scheduled Meds: . azaTHIOprine  50 mg Oral Daily  . cyclobenzaprine  5 mg Oral Once  . famotidine  20 mg Oral BID  . LORazepam  1 mg Oral 4 times per day  . methylPREDNISolone (SOLU-MEDROL) injection  500 mg Intravenous Q12H  . pantoprazole  40 mg Oral Daily  . pregabalin  150 mg Oral Q12H  . rivaroxaban  20 mg Oral Q supper  . senna  2 tablet Oral QHS  . sertraline  100 mg Oral Daily   Continuous Infusions: . sodium chloride 50 mL/hr at 11/29/15 0926     Time spent: > 25 minutes    Velvet Bathe  [facility] Pager [phone]. If 7PM-7AM, please contact night-coverage at [email or web address], password [facility] 12/01/2015, 1:56 PM  LOS: 6 days

## 2015-12-01 NOTE — Progress Notes (Signed)
Subjective: Still only able to see shadows  Exam: Filed Vitals:   12/01/15 0114 12/01/15 0540  BP: 143/77 160/88  Pulse: 65   Temp: 98.5 F (36.9 C) 97.8 F (36.6 C)  Resp: 16     Gen: In bed, NAD MS: alert and oriented, able to follow commands. Speech clear.  CN: EOMI, TML, face symmetrical. Pupil 6 mm and non-reactive. Able to see shadows but unable to count fingers.  Motor: left arm and leg is hemiplegic with full strength on right arm and leg.  Sensory: normal sensation on the right arm and allodynia of left arm and leg.    Pertinent Labs: none  Impression: Bilateral vision loss 5 days prior. Although no optic nerve enhancement is seen on MRI, her vision loss is most likely secondary to bilateral optic nerve inflammation (optic neuritis) given her history of neuromyelitis optica. MRI shows a few small enhancing lesions within the brain parenchyma. Currently on Solumedrol with some improvement.   Recommendations: 1) Receiving last dose of Solumedrol today 2) No need for steroid taper.  3) Will need out patient follow up and rehab.    Patient seen and discussed with Etta Quill, PA-C  Kerney Elbe, MD 12/01/2015, 1:21 PM

## 2015-12-01 NOTE — Evaluation (Signed)
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 Pt currently with functional limitations due to the deficits listed below (see PT Problem List). Reports she was ambulating up to 75 feet with physical therapy prior to admission, using a hemi-walker. Today, pt required mod assist to stand and was unable to safely ambulate due to significant imbalance and moderate buckling of LEs. She is eager to work with PT again at [facility] and hopeful to regain her strength and independence. Pt will benefit from skilled PT to increase their independence and safety with mobility to allow discharge to the venue listed below.       Follow Up Recommendations SNF    Equipment Recommendations  None recommended by PT    Recommendations for Other Services       Precautions / Restrictions Precautions Precautions: Fall Restrictions Weight Bearing Restrictions: No      Mobility  Bed Mobility               General bed mobility comments: sitting in recliner  Transfers Overall transfer level: Needs assistance Equipment used: 1 person hand held assist Transfers: Sit to/from Stand Sit to Stand: Mod assist         General transfer comment: Mod assist for boost and balance to rise from recliner. Demonstrates Rt lateral lean, with some LE buckling initially.  Ambulation/Gait Ambulation/Gait assistance: Mod assist Ambulation Distance (Feet): 1 Feet Assistive device: 1 person hand held assist Gait Pattern/deviations: Step-to pattern;Ataxic     General Gait Details: Significant ataxia with buckling and Rt lateral lean. Unsafe to take more steps without additional assistance. Finished with pre-gait activities.  Stairs             Wheelchair Mobility    Modified Rankin (Stroke Patients Only)       Balance Overall balance assessment: Needs assistance Sitting-balance support: No upper extremity supported;Feet supported Sitting balance-Leahy Scale: Fair Sitting balance - Comments: Sits for short periods of time without support however looses balance and leans posteriorly or needs UE support on arm rest of chair. Postural control: Posterior lean;Right lateral lean Standing balance support: Single extremity supported Standing balance-Leahy Scale: Poor Standing balance comment: Tolerated approx 10 minutes standing with pre-gait and balance tasks focusing on finding midline, lifting feet, weight shifting, and standing upright. Intermittently with LE buckling but without full collapse. Hand helds support required at all times for balance.                             Pertinent Vitals/Pain Pain Assessment: No/denies pain (LEs feel "stiff" denies pain)    Home Living Family/patient expects to be discharged to:: Skilled nursing facility     Type of Home: House Home Access: Stairs to enter Entrance Stairs-Rails: None Entrance Stairs-Number of Steps: 3 Home Layout: One level Home Equipment: None Additional Comments: Pt has been staying at SNF     Prior Function Level of Independence: Needs assistance   Gait / Transfers Assistance Needed: working with PT/OT at rehab. walking with PT L platform walker (Platform vs hemi-walker?)     Comments: Pt has been at SNF     Hand Dominance   Dominant Hand: Right    Extremity/Trunk Assessment  Upper Extremity Assessment: Defer to OT evaluation           Lower Extremity Assessment: RLE deficits/detail;LLE deficits/detail RLE Deficits / Details: MMT: knee extension 4/5, knee flexion 4-/5, hip flexion 4/5, dorsiflexion 4-/5 Reports normal sensation throughout LLE Deficits / Details: MMT: hip flexion 4-/5, knee extension 4/-5, knee flexion  3+/5, Dorsiflexion, 3/5 Reports diminished distal light touch sensation     Communication   Communication: No difficulties  Cognition Arousal/Alertness: Awake/alert Behavior During Therapy: WFL for tasks assessed/performed Overall Cognitive Status: Within Functional Limits for tasks assessed                      General Comments      Exercises        Assessment/Plan    PT Assessment Patient needs continued PT services  PT Diagnosis Difficulty walking;Generalized weakness   PT Problem List Decreased strength;Decreased activity tolerance;Decreased balance;Decreased mobility;Decreased range of motion;Decreased coordination;Decreased knowledge of use of DME;Impaired sensation  PT Treatment Interventions DME instruction;Gait training;Functional mobility training;Therapeutic activities;Therapeutic exercise;Balance training;Neuromuscular re-education;Patient/family education   PT Goals (Current goals can be found in the Care Plan section) Acute Rehab PT Goals Patient Stated Goal: Be able to walk again PT Goal Formulation: With patient Time For Goal Achievement: 12/15/15 Potential to Achieve Goals: Fair    Frequency Min 2X/week   Barriers to discharge Decreased caregiver support Plans to return to SNF    Co-evaluation               End of Session Equipment Utilized During Treatment: Gait belt Activity Tolerance: Patient tolerated treatment well Patient left: in chair;with call bell/phone within reach;with chair alarm set Nurse Communication: Mobility status         Time: [id number] PT Time Calculation (min) (ACUTE ONLY): 19 min   Charges:   PT Evaluation $PT Eval Moderate Complexity: 1 Procedure     PT G CodesEllouise Berry 12/01/2015, 5:19 PM Andrea Berry, Andrea Berry

## 2015-12-01 NOTE — Care Management Note (Signed)
Case Management Note  Patient Details  Name: Andrea Berry MRN: [id number] Date of Birth: 1966-03-23  Subjective/Objective:                    Action/Plan: Pt continuing to receive IV steroids. CM following for discharge needs.   Expected Discharge Date:                  Expected Discharge Plan:  Skilled Nursing Facility  In-House Referral:  Clinical Social Work  Discharge planning Services     Post Acute Care Choice:    Choice offered to:     DME Arranged:    DME Agency:     HH Arranged:    [place] Agency:     Status of Service:  In process, will continue to follow  Medicare Important Message Given:  Yes Date Medicare IM Given:    Medicare IM give by:    Date Additional Medicare IM Given:    Additional Medicare Important Message give by:     If discussed at [place] of Stay Meetings, dates discussed:    Additional Comments:  Pollie Friar, RN 12/01/2015, 1:52 PM

## 2015-12-02 LAB — GLUCOSE, CAPILLARY
GLUCOSE-CAPILLARY: 88 mg/dL (ref 65–99)
GLUCOSE-CAPILLARY: 110 mg/dL — AB (ref 65–99)

## 2015-12-02 MED ORDER — HYDROMORPHONE HCL 2 MG PO TABS: 4.0000 mg | ORAL_TABLET | ORAL | Status: DC | PRN

## 2015-12-02 MED ORDER — SENNA 8.6 MG PO TABS: 2.0000 | ORAL_TABLET | Freq: Every day | ORAL | Status: DC

## 2015-12-02 MED ORDER — LORAZEPAM 1 MG PO TABS: 1.0000 mg | ORAL_TABLET | Freq: Four times a day (QID) | ORAL | Status: DC

## 2015-12-02 MED ORDER — RIVAROXABAN (XARELTO) VTE STARTER PACK (15 & 20 MG): ORAL_TABLET | ORAL | Status: DC

## 2015-12-02 MED ORDER — SERTRALINE HCL 50 MG PO TABS: 100.0000 mg | ORAL_TABLET | Freq: Every day | ORAL | Status: DC

## 2015-12-02 MED ORDER — BISACODYL 10 MG RE SUPP: 10.0000 mg | Freq: Every day | RECTAL | Status: DC | PRN

## 2015-12-02 MED ORDER — CYCLOBENZAPRINE HCL 5 MG PO TABS: 5.0000 mg | ORAL_TABLET | Freq: Every day | ORAL | Status: DC | PRN

## 2015-12-02 NOTE — Clinical Social Work Note (Signed)
Clinical Social Worker facilitated patient discharge including contacting patient family (patient's son, [name]) and facility to confirm patient discharge plans.  Clinical information faxed to facility and family agreeable with plan.  CSW arranged ambulance transport via [facility] to [facility].  RN to call report prior to discharge.  Clinical Social Worker will sign off for now as social work intervention is no longer needed. Please consult [place] again if new need arises.  Glendon Axe, MSW, LCSWA [phone] 12/02/2015 11:48 AM

## 2015-12-02 NOTE — Discharge Summary (Signed)
Physician Discharge Summary   Patient ID: NYOTA BRUMMELL MRN: [id number] DOB/AGE: 1966-06-24 50 y.o.  Admit date: 11/25/2015 Discharge date: 12/02/2015  Primary Care Physician:  Minette Brine  Discharge Diagnoses:    . Lupus cerebritis ([place]) . Anemia of chronic disease . UTI (lower urinary tract infection) . Hyponatremia  Constipation    History of pulmonary embolism  Consults:  Neurology   Recommendations for Outpatient Follow-up:  1. Continue PTOT at the facility  2. Please repeat CBC/BMET at next visit 3. I have made an neurology appointment outpatient for 12/06/15 with neurology Dr Tomi Likens, patient needs to follow up with outpatient neurology    DIET: Heart healthy diet   Allergies:  No Known Allergies   DISCHARGE MEDICATIONS: Current Discharge Medication List    START taking these medications   Details  bisacodyl (DULCOLAX) 10 MG suppository Place 1 suppository (10 mg total) rectally daily as needed for moderate constipation. Qty: 12 suppository, Refills: 0    cyclobenzaprine (FLEXERIL) 5 MG tablet Take 1 tablet (5 mg total) by mouth daily as needed for muscle spasms. Qty: 30 tablet, Refills: 0    senna (SENOKOT) 8.6 MG TABS tablet Take 2 tablets (17.2 mg total) by mouth at bedtime. Qty: 120 each, Refills: 0      CONTINUE these medications which have CHANGED   Details  HYDROmorphone (DILAUDID) 2 MG tablet Take 2 tablets (4 mg total) by mouth every 4 (four) hours as needed for severe pain. Qty: 30 tablet, Refills: 0    LORazepam (ATIVAN) 1 MG tablet Take 1 tablet (1 mg total) by mouth 4 (four) times daily. Qty: 30 tablet, Refills: 0    Rivaroxaban (XARELTO STARTER PACK) 15 & 20 MG TBPK Take 20mg  tablet daily with supper. Qty: 30 each, Refills: 0    sertraline (ZOLOFT) 50 MG tablet Take 2 tablets (100 mg total) by mouth daily. Qty: 30 tablet, Refills: 0      CONTINUE these medications which have NOT CHANGED   Details  acetaminophen (TYLENOL) 325 MG  tablet Take 650 mg by mouth every 4 (four) hours as needed for fever.    alendronate (FOSAMAX) 70 MG tablet Take 70 mg by mouth once a week. Take with a full glass of water on an empty stomach.    azaTHIOprine (IMURAN) 50 MG tablet Take 50 mg by mouth daily.     omeprazole (PRILOSEC) 20 MG capsule Take 20 mg by mouth daily.     predniSONE (DELTASONE) 5 MG tablet Take 5 mg by mouth daily with breakfast.    pregabalin (LYRICA) 150 MG capsule Take 150 mg by mouth every 12 (twelve) hours.         Brief H and P: For complete details please refer to admission H and P, but in brief Patient is a 50 year old with history of SLE and depression presenting with lupus cerebritis. She presented to [facility] with intermittent vision changes, fevers.  Hospital Course:  Lupus cerebritis ([place]) MRI of the brain showed acute patchy and confluent bilateral posterior hemisphere white matter flair hyperintensity, with patchy associated enhancement on the left which could reflect acute demyelinating disease or lupus related cerebritis. No associated acute infarct Neurology was consulted and recommended transfer to [facility] for IV high-dose steroids. Patient received IV high-dose steroids, last dose on 12/01/15 Neurology suspecting BL optic neuritis Patient will return to skilled nursing facility for continued rehabilitation. I have also made her an outpatient neurology appointment with Dr. Tomi Likens on 12/06/15.  Depression - Increased dose of Zoloft 3/19  Constipation - on senna and dulcolax suppository   Anemia of chronic disease - Continue to follow H&H, currently stable   UTI (lower urinary tract infection) - completed 3 days treatment with rocephin. U/C negative.   Hyponatremia - resolved and most likely due to poor oral intake, sodium 136 at the time of discharge  History of pulmonary embolism Continue xarelto 20 mg daily with supper now, patient has completed 3 weeks of twice a  day dosing of xarelto   Day of Discharge No new complaints, vision is improving, looking forward to starting rehab  BP 106/51 mmHg  Pulse 74  Temp(Src) 97.8 F (36.6 C) (Oral)  Resp 18  Ht 5\' 1"  (1.549 m)  Wt 74.526 kg (164 lb 4.8 oz)  BMI 31.06 kg/m2  SpO2 100%  Physical Exam: General: Alert and awake oriented x3 not in any acute distress. HEENT:vision improving per patient  CVS: S1-S2 clear no murmur rubs or gallops Chest: clear to auscultation bilaterally, no wheezing rales or rhonchi Abdomen: soft nontender, nondistended, normal bowel sounds Extremities: no cyanosis, clubbing or edema noted bilaterally    The results of significant diagnostics from this hospitalization (including imaging, microbiology, ancillary and laboratory) are listed below for reference.    LAB RESULTS: Basic Metabolic Panel:  Recent Labs Lab 11/25/15 2348 11/26/15 0621  NA 134* 136  K 4.5 4.4  CL 103 106  CO2 17* 18*  GLUCOSE 110* 81  BUN 9 8  CREATININE 0.66 0.54  CALCIUM 8.8* 8.9   Liver Function Tests:  Recent Labs Lab 11/25/15 2348  AST 62*  ALT 25  ALKPHOS 49  BILITOT 0.7  PROT 7.2  ALBUMIN 3.5   No results for input(s): LIPASE, AMYLASE in the last 168 hours. No results for input(s): AMMONIA in the last 168 hours. CBC:  Recent Labs Lab 11/26/15 0621  WBC 3.5*  HGB 11.3*  HCT 34.7*  MCV 73.5*  PLT 284   Cardiac Enzymes: No results for input(s): CKTOTAL, CKMB, CKMBINDEX, TROPONINI in the last 168 hours. BNP: Invalid input(s): POCBNP CBG:  Recent Labs Lab 12/01/15 2132 12/02/15 0616  GLUCAP 116* 88    Significant Diagnostic Studies:  Mr [name] Contrast  11/25/2015  CLINICAL DATA:  50 year old female with urinary retention, possible cerebritis. Personal history of lupus and Devic's disease. Initial encounter. EXAM: MRI HEAD WITHOUT AND WITH CONTRAST TECHNIQUE: Multiplanar, multiecho pulse sequences of the brain and surrounding structures were obtained  without and with intravenous contrast. CONTRAST:  5mL MULTIHANCE GADOBENATE DIMEGLUMINE 529 MG/ML IV SOLN COMPARISON:  Brain and cervical spine MRI 09/21/2015. FINDINGS: Major intracranial vascular flow voids are stable and within normal limits. Regression of abnormal T2 hyperintense signal at the cervicomedullary junction since January (series 8, image 1). Sagittal images also suggest resolution of the abnormal cervical cord expansion which was associated. No restricted diffusion to suggest acute infarction. No midline shift, mass effect, evidence of mass lesion, ventriculomegaly, extra-axial collection or acute intracranial hemorrhage. Cervicomedullary junction and pituitary are within normal limits. New Patchy and confluent posterior superior cerebral white matter FLAIR hyperintensity (series 9, image 15). While at the same time the posterior left thalamic signal abnormality seen in January has regressed. The new changes extend on the left to the posterior limb of the left internal capsule (series 8, image 13, and on the right to the frontal operculum. While superiorly the changes on the left extend to the subcortical left sensory strip white matter,  and on the right the subcortical right motor strip white matter. There is mild associated patchy enhancement in the left hemispheric white matter abnormality, no associated enhancement in the right hemisphere. No dural thickening. No other abnormal intracranial enhancement. No other new signal abnormality. Visible internal auditory structures appear normal. Mastoids are clear. Minimal paranasal sinus mucosal thickening has not significantly changed. Grossly normal orbit soft tissues. Negative scalp soft tissues. Visualized bone marrow signal is within normal limits. IMPRESSION: 1. Acute patchy and confluent bilateral posterior hemisphere white matter FLAIR hyperintensity, with patchy associated enhancement on the left. No associated acute infarct. This new signal  abnormality could reflect acute demyelinating disease or lupus related cerebritis in this setting. 2. At the same time the previously-seen abnormal signal in the left thalamus and cervicomedullary junction has regressed since January. Cervical cord signal is not otherwise evaluated, but sagittal images of the brain suggests to the cord swelling has also resolved. Electronically Signed   By: Genevie Ann M.D.   On: 11/25/2015 12:37    2D ECHO:   Disposition and Follow-up: Discharge Instructions    Diet - low sodium heart healthy    Complete by:  As directed      Increase activity slowly    Complete by:  As directed             DISPOSITION: SNF   DISCHARGE FOLLOW-UP Follow-up Information    Follow up with [facility].   Specialty:  [place] information:   [address] [place] [phone]      Follow up with Minette Brine. Schedule an appointment as soon as possible for a visit in 2 weeks.   Specialty:  General Practice   Why:  for hospital follow-up   Contact information:   [address] [place] [address] [phone]       Follow up with Clabe Seal, MD On 12/12/2015.   Specialty:  Rheumatology   Why:  please keep your appointment (rheumatology)   Contact information:   [address] [place] [address] [phone]       Follow up with Dudley Major, DO On 12/06/2015.   Specialty:  Neurology   Why:  at 8:45 AM , for hospital follow-up   Contact information:   [place] [address] [phone]        Time spent on Discharge: 27mins   Signed:   RAI,RIPUDEEP M.D. [facility] 12/02/2015, 11:19 AM Pager: [phone]

## 2015-12-02 NOTE — Care Management Important Message (Signed)
Important Message  Patient Details  Name: Andrea Berry MRN: [id number] Date of Birth: 04-Feb-1966   Medicare Important Message Given:  Yes    [name] P [place] 12/02/2015, 12:44 PM

## 2015-12-02 NOTE — Progress Notes (Signed)
Report given to vineta at [facility]. Patient is ready for discharge, IV removed, tele removed, neuro assessment unchanged. Prescriptions for snf have been signed and are in chart, to be given to [facility]. [facility] has been called for transportation. Family aware of patient's move back to snf. Will continue to monitor patient.

## 2015-12-02 NOTE — Care Management Note (Signed)
Case Management Note  Patient Details  Name: MELISSSA STRICKLIN MRN: [id number] Date of Birth: 09/26/1965  Subjective/Objective:                    Action/Plan: Plan is for patient to discharge back to [facility] today. No further needs per CM.   Expected Discharge Date:                  Expected Discharge Plan:  Skilled Nursing Facility  In-House Referral:  Clinical Social Work  Discharge planning Services     Post Acute Care Choice:    Choice offered to:     DME Arranged:    DME Agency:     HH Arranged:    [place] Agency:     Status of Service:  In process, will continue to follow  Medicare Important Message Given:  Yes Date Medicare IM Given:    Medicare IM give by:    Date Additional Medicare IM Given:    Additional Medicare Important Message give by:     If discussed at [place] of Stay Meetings, dates discussed:    Additional Comments:  Pollie Friar, RN 12/02/2015, 11:15 AM

## 2015-12-06 ENCOUNTER — Encounter: Payer: Self-pay | Admitting: Neurology

## 2015-12-06 ENCOUNTER — Ambulatory Visit (INDEPENDENT_AMBULATORY_CARE_PROVIDER_SITE_OTHER): Payer: Medicare Other | Admitting: Neurology

## 2015-12-06 VITALS — BP 134/90 | HR 88 | Ht 61.0 in

## 2015-12-06 DIAGNOSIS — G360 Neuromyelitis optica [Devic]: Secondary | ICD-10-CM | POA: Diagnosis not present

## 2015-12-06 DIAGNOSIS — M3219 Other organ or system involvement in systemic lupus erythematosus: Secondary | ICD-10-CM

## 2015-12-06 NOTE — Patient Instructions (Addendum)
I will refer you to the Multiple Sclerosis Clinic and neuro-ophthalmologist at [facility] for continued care regarding the lupus and neuromyelitis optica.  I think you will probably need to change the immunosuppressive medication to something else (such as rituximab).  We will set up as soon as possible.  You will likely need to remain on Xarelto indefinitely to prevent further blood clots or strokes.

## 2015-12-06 NOTE — Progress Notes (Signed)
NEUROLOGY CONSULTATION NOTE  Andrea Berry MRN: [id number] DOB: 03/22/1966  Referring provider: Hospital referral Primary care provider: Minette Brine, FNP-BC  Reason for consult:  Lupus cerebritis  HISTORY OF PRESENT ILLNESS: Andrea Berry is a 50 year old right-handed female with SLE, legally blind, hypertension, depression and history of CVA 2008 who presents for neuromyelitis optica and recent lupus cerebritis.  She is accompanied by her son, who provides some history.  Labs and imaging of brain MRI reviewed.  [name] was diagnosed with SLE over 20 years ago.  It had been well controlled on prednisone and Imuran.  She is legally blind due to a stroke in 2008. She was admitted to [facility] on 09/21/15 for weakness and numbness in left arm, associated with pain, which progressed to left lower extremity weakness and gait instability.  CT of head was normal.  MRI of brain and cervical spine without contrast showed 1 cm abnormal T2/FLAIR signal in the left thalamus, consistent with either acute focus of demyelination or less likely late subacute infarction.  Cervical spine showed diffusely abnormal abnormal T2 signal and swelling of the spinal cord.  Labs included NMO-IgG of 347.5.  B12 was 361, HIV negative, RPR non-reactive, sed rate 31, CRP negative, ANA negative, and TSH 3.957.  She underwent lumbar puncture where CSF demonstrated WBC 10 with 76% neutrophils and RBC 44, protein 87, glucose 68, negative culture, ACE 1, VDRL non-reactive, and no oligoclonal bands.  She initially received IV Solu-Medrol for 5 days and plasma exchange every other day for total of 5 doses.  She then started IVIG for 5 days.  She was initially on prednisone 5mg  daily and was discharged on this dose.    She was readmitted to the hospital at [facility] on 10/27/15 for sepsis related to pneumonia and pulmonary embolism secondary to DVT in right internal jugular vein.  She was discharged on Xarelto.   She returned to  [facility] on 11/25/15 for new bilateral vision loss.  MRI of the brain with and without contrast showed acute patchy and confluent bilateral posterior hemispheric white matter FLAIR hyperintensity with patchy associated enhancement on the left.  Prior signal in the left thalamus and cervicomedullary junction regressed since prior imaging in January.  Bilateral optic neuritis suspected despite no signal enhancement of the optic nerves on MRI.  She received IV Solu-Medrol.  She was also treated with rocephin for UTI.  She has since been residing at a rehab facility.  She still has not regain her sight.  She has developed some strength back in her left arm and leg.  She reports paresthesias in her feet.  She cannot walk.  She continues on Imuran and prednisone 5mg  daily.  She is currently being followed by two rheumatologists, Dr. Bo Merino and Dr. Sabino Niemann.  PAST MEDICAL HISTORY: Past Medical History  Diagnosis Date  . Hypertension   . Blind   . Lupus ([place])   . Kidney stones   . Shortness of breath     due to medication  . Depression   . GERD (gastroesophageal reflux disease)   . Headache(784.0)   . Arthritis   . Stroke [facility]) 2008    visually impaired    PAST SURGICAL HISTORY: Past Surgical History  Procedure Laterality Date  . Tubal ligation    . Tubal ligation    . Breast biopsy      MEDICATIONS: Current Outpatient Prescriptions on File Prior to Visit  Medication Sig Dispense Refill  .  acetaminophen (TYLENOL) 325 MG tablet Take 650 mg by mouth every 4 (four) hours as needed for fever.    Marland Kitchen alendronate (FOSAMAX) 70 MG tablet Take 70 mg by mouth once a week. Take with a full glass of water on an empty stomach.    Marland Kitchen azaTHIOprine (IMURAN) 50 MG tablet Take 50 mg by mouth daily.     . bisacodyl (DULCOLAX) 10 MG suppository Place 1 suppository (10 mg total) rectally daily as needed for moderate constipation. 12 suppository 0  . cyclobenzaprine (FLEXERIL) 5 MG tablet Take 1  tablet (5 mg total) by mouth daily as needed for muscle spasms. 30 tablet 0  . HYDROmorphone (DILAUDID) 2 MG tablet Take 2 tablets (4 mg total) by mouth every 4 (four) hours as needed for severe pain. 30 tablet 0  . LORazepam (ATIVAN) 1 MG tablet Take 1 tablet (1 mg total) by mouth 4 (four) times daily. 30 tablet 0  . omeprazole (PRILOSEC) 20 MG capsule Take 20 mg by mouth daily.     . predniSONE (DELTASONE) 5 MG tablet Take 5 mg by mouth daily with breakfast.    . pregabalin (LYRICA) 150 MG capsule Take 150 mg by mouth every 12 (twelve) hours.    . Rivaroxaban (XARELTO STARTER PACK) 15 & 20 MG TBPK Take 20mg  tablet daily with supper. 30 each 0  . senna (SENOKOT) 8.6 MG TABS tablet Take 2 tablets (17.2 mg total) by mouth at bedtime. 120 each 0  . sertraline (ZOLOFT) 50 MG tablet Take 2 tablets (100 mg total) by mouth daily. (Patient not taking: Reported on 12/06/2015) 30 tablet 0   No current facility-administered medications on file prior to visit.    ALLERGIES: No Known Allergies  FAMILY HISTORY: Family History  Problem Relation Age of Onset  . Cancer Mother   . Cancer Father   . Lupus Sister     SOCIAL HISTORY: Social History   Social History  . Marital Status: Single    Spouse Name: N/A  . Number of Children: N/A  . Years of Education: N/A   Occupational History  . Not on file.   Social History Main Topics  . Smoking status: Never Smoker   . Smokeless tobacco: Never Used  . Alcohol Use: No  . Drug Use: No  . Sexual Activity: Not Currently   Other Topics Concern  . Not on file   Social History Narrative    REVIEW OF SYSTEMS: Constitutional: No fevers, chills, or sweats, no generalized fatigue, change in appetite Eyes: as above Ear, nose and throat: No hearing loss, ear pain, nasal congestion, sore throat Cardiovascular: No chest pain, palpitations Respiratory:  No shortness of breath at rest or with exertion, wheezes GastrointestinaI: No nausea, vomiting,  diarrhea, abdominal pain, fecal incontinence Genitourinary:  No dysuria, urinary retention or frequency Musculoskeletal:  No neck pain, back pain Integumentary: No rash, pruritus, skin lesions Neurological: as above Psychiatric: depression Endocrine: No palpitations Hematologic/Lymphatic:  anemia Allergic/Immunologic: no itchy/runny eyes, nasal congestion, recent allergic reactions, rashes  PHYSICAL EXAM: Filed Vitals:   12/06/15 0847  BP: 134/90  Pulse: 88   General: No acute distress.  Head:  Normocephalic/atraumatic Eyes:  fundi examined but not visualized. Neck: supple, no paraspinal tenderness, full range of motion Back: No paraspinal tenderness Heart: regular rate and rhythm Lungs: Clear to auscultation bilaterally. Vascular: No carotid bruits. Neurological Exam: Mental status: alert and oriented to person, place, and time, recent and remote memory intact, fund of knowledge intact, attention and  concentration intact, speech fluent and not dysarthric, language intact. Cranial nerves: CN I: not tested CN II: pupils equal, 35mm, round and non-reactive to light, decreased visual field bilaterally (only sees shadows) CN III, IV, VI:  full range of motion, no nystagmus, no ptosis CN V: facial sensation intact CN VII: upper and lower face symmetric CN VIII: hearing intact CN IX, X: gag intact, uvula midline CN XI: sternocleidomastoid and trapezius muscles intact CN XII: tongue midline Bulk & Tone: normal, no fasciculations. Motor:  3+/5 left deltoid, 2+/5 left biceps/triceps/wrist and grip, 4+/5 left hip flexion and hamstrings, 5-/5 left ankle dorsiflexion, otherwise 5/5 Sensation:  Decreased vibration sensation in feet.  Pinprick sensation intact. Deep Tendon Reflexes:  2+ throughout, toes downgoing. Finger to nose testing:  Without dysmetria.  Heel to shin:  Unable to assess Gait:  Unable to stand and walk.  IMPRESSION: Neuromyelitis optica, diagnosed in hospital with  probable bilateral optic neuritis SLE with lupus cerebritis History of stroke, vision loss as late effect of stroke Depression  PLAN: I think she should probably be started on rituximab to treat NMO She should remain on anticoagulation indefinitely for secondary stroke prevention Since I don't manage immunosuppressant medications, she would best be treated by a subspecialist.  I will refer her to both the [facility] and neuro-ophthalmology at [facility] for continued care  Thank you for allowing me to take part in the care of this patient.  Metta Clines, DO  CC:  Minette Brine, FNP-BC  Bo Merino, MD  Sabino Niemann, MD

## 2015-12-06 NOTE — Progress Notes (Signed)
Pt is scheduled w/ Dr. Ala Bent at [facility] for 02/24/16 @ 9:45 a.m. ([address]), Pt is to see Neuro-Ophth. August 15 @ 9:45 ([facility] medical center [place], [place])   Can call [phone] to reschedule.   Pt's son, [name], aware. Please contact [name] for any needs [phone]

## 2015-12-06 NOTE — Progress Notes (Signed)
Chart forwarded.  

## 2015-12-19 ENCOUNTER — Telehealth: Payer: Self-pay

## 2015-12-19 DIAGNOSIS — M3219 Other organ or system involvement in systemic lupus erythematosus: Secondary | ICD-10-CM

## 2015-12-20 ENCOUNTER — Encounter: Payer: Self-pay | Admitting: Neurology

## 2015-12-20 ENCOUNTER — Ambulatory Visit (INDEPENDENT_AMBULATORY_CARE_PROVIDER_SITE_OTHER): Payer: Medicare Other | Admitting: Neurology

## 2015-12-20 VITALS — BP 106/64 | HR 125

## 2015-12-20 DIAGNOSIS — G360 Neuromyelitis optica [Devic]: Secondary | ICD-10-CM | POA: Diagnosis not present

## 2015-12-20 DIAGNOSIS — M3219 Other organ or system involvement in systemic lupus erythematosus: Secondary | ICD-10-CM | POA: Diagnosis not present

## 2015-12-20 MED ORDER — GABAPENTIN 300 MG PO CAPS: 300.0000 mg | ORAL_CAPSULE | Freq: Three times a day (TID) | ORAL | Status: DC

## 2015-12-20 NOTE — Progress Notes (Signed)
Chart forwarded.  

## 2015-12-20 NOTE — Telephone Encounter (Signed)
Opened in error

## 2015-12-20 NOTE — Progress Notes (Signed)
NEUROLOGY FOLLOW UP OFFICE NOTE  AVALEE KAPPEN [id number]  HISTORY OF PRESENT ILLNESS: Brisia Billing is a 50 year old right-handed female with SLE, legally blind, hypertension, depression and history of CVA 2008 who follows up for neuromyelitis optica and recent lupus cerebritis.  She is accompanied by her sons, who provides some history.    UPDATE: Burning and numbness in feet persist, although unchanged.  HISTORY: Ms. Cottrill was diagnosed with SLE over 20 years ago.  It had been well controlled on prednisone and Imuran.  She is legally blind due to a stroke in 2008. She was admitted to [facility] on 09/21/15 for weakness and numbness in left arm, associated with pain, which progressed to left lower extremity weakness and gait instability.  CT of head was normal.  MRI of brain and cervical spine without contrast showed 1 cm abnormal T2/FLAIR signal in the left thalamus, consistent with either acute focus of demyelination or less likely late subacute infarction.  Cervical spine showed diffusely abnormal abnormal T2 signal and swelling of the spinal cord.  Labs included NMO-IgG of 347.5.  B12 was 361, HIV negative, RPR non-reactive, sed rate 31, CRP negative, ANA negative, and TSH 3.957.  She underwent lumbar puncture where CSF demonstrated WBC 10 with 76% neutrophils and RBC 44, protein 87, glucose 68, negative culture, ACE 1, VDRL non-reactive, and no oligoclonal bands.  She initially received IV Solu-Medrol for 5 days and plasma exchange every other day for total of 5 doses.  She then started IVIG for 5 days.  She was initially on prednisone 5mg  daily and was discharged on this dose.    She was readmitted to the hospital at [facility] on 10/27/15 for sepsis related to pneumonia and pulmonary embolism secondary to DVT in right internal jugular vein.  She was discharged on Xarelto.   She returned to [facility] on 11/25/15 for new bilateral vision loss.  MRI of the brain with and without contrast showed  acute patchy and confluent bilateral posterior hemispheric white matter FLAIR hyperintensity with patchy associated enhancement on the left.  Prior signal in the left thalamus and cervicomedullary junction regressed since prior imaging in January.  Bilateral optic neuritis suspected despite no signal enhancement of the optic nerves on MRI.  She received IV Solu-Medrol.  She was also treated with rocephin for UTI.  She has since been residing at a rehab facility.  She still has not regain her sight.  She has developed some strength back in her left arm and leg.  She reports paresthesias in her feet.  She cannot walk.  She continues on Imuran and prednisone 5mg  daily.  She is currently being followed by two rheumatologists, Dr. Bo Merino and Dr. Sabino Niemann.  PAST MEDICAL HISTORY: Past Medical History  Diagnosis Date  . Hypertension   . Blind   . Lupus ([place])   . Kidney stones   . Shortness of breath     due to medication  . Depression   . GERD (gastroesophageal reflux disease)   . Headache(784.0)   . Arthritis   . Stroke [facility]) 2008    visually impaired    MEDICATIONS: Current Outpatient Prescriptions on File Prior to Visit  Medication Sig Dispense Refill  . acetaminophen (TYLENOL) 325 MG tablet Take 650 mg by mouth every 4 (four) hours as needed for fever.    Marland Kitchen alendronate (FOSAMAX) 70 MG tablet Take 70 mg by mouth once a week. Take with a full glass of water  on an empty stomach.    Marland Kitchen azaTHIOprine (IMURAN) 50 MG tablet Take 50 mg by mouth daily.     . bisacodyl (DULCOLAX) 10 MG suppository Place 1 suppository (10 mg total) rectally daily as needed for moderate constipation. 12 suppository 0  . cyclobenzaprine (FLEXERIL) 5 MG tablet Take 1 tablet (5 mg total) by mouth daily as needed for muscle spasms. 30 tablet 0  . HYDROmorphone (DILAUDID) 2 MG tablet Take 2 tablets (4 mg total) by mouth every 4 (four) hours as needed for severe pain. 30 tablet 0  . LORazepam (ATIVAN) 1 MG tablet  Take 1 tablet (1 mg total) by mouth 4 (four) times daily. 30 tablet 0  . omeprazole (PRILOSEC) 20 MG capsule Take 20 mg by mouth daily.     . predniSONE (DELTASONE) 5 MG tablet Take 5 mg by mouth daily with breakfast.    . pregabalin (LYRICA) 150 MG capsule Take 150 mg by mouth every 12 (twelve) hours.    . Rivaroxaban (XARELTO STARTER PACK) 15 & 20 MG TBPK Take 20mg  tablet daily with supper. 30 each 0  . senna (SENOKOT) 8.6 MG TABS tablet Take 2 tablets (17.2 mg total) by mouth at bedtime. 120 each 0  . sertraline (ZOLOFT) 50 MG tablet Take 2 tablets (100 mg total) by mouth daily. 30 tablet 0   No current facility-administered medications on file prior to visit.    ALLERGIES: No Known Allergies  FAMILY HISTORY: Family History  Problem Relation Age of Onset  . Cancer Mother   . Cancer Father   . Lupus Sister     SOCIAL HISTORY: Social History   Social History  . Marital Status: Single    Spouse Name: N/A  . Number of Children: N/A  . Years of Education: N/A   Occupational History  . Not on file.   Social History Main Topics  . Smoking status: Never Smoker   . Smokeless tobacco: Never Used  . Alcohol Use: No  . Drug Use: No  . Sexual Activity: Not Currently   Other Topics Concern  . Not on file   Social History Narrative    REVIEW OF SYSTEMS: Constitutional: No fevers, chills, or sweats, no generalized fatigue, change in appetite Eyes: No visual changes, double vision, eye pain Ear, nose and throat: No hearing loss, ear pain, nasal congestion, sore throat Cardiovascular: No chest pain, palpitations Respiratory:  No shortness of breath at rest or with exertion, wheezes GastrointestinaI: No nausea, vomiting, diarrhea, abdominal pain, fecal incontinence Genitourinary:  No dysuria, urinary retention or frequency Musculoskeletal:  No neck pain, back pain Integumentary: No rash, pruritus, skin lesions Neurological: as above Psychiatric: No depression, insomnia,  anxiety Endocrine: No palpitations, fatigue, diaphoresis, mood swings, change in appetite, change in weight, increased thirst Hematologic/Lymphatic:  No anemia, purpura, petechiae. Allergic/Immunologic: no itchy/runny eyes, nasal congestion, recent allergic reactions, rashes  PHYSICAL EXAM: Filed Vitals:   12/20/15 0936  BP: 106/64  Pulse: 125   General: No acute distress.   Head:  Normocephalic/atraumatic  IMPRESSION: NMO Lupus cerebritis  PLAN: 1.  Will prescribe gabapentin 300mg  three times daily for neuropathic pain 2.  Will refer patient to Dr. Arlice Colt, a specialist in neuroimmunology, for further management as this is outside of my scope of practice.  15 minutes spent face to face with patient, 100% spent discussing diagnosis, prognosis and management.  Metta Clines, DO  CC:  Minette Brine

## 2015-12-20 NOTE — Patient Instructions (Signed)
1.  For the pain, we will start gabapentin 300mg  three times daily. 2.  I will get you in to see Dr. Arlice Colt, who is a specialist in these type of neurologic diseases.  He will definitely be able to see you at the very latest by next week. 3.  Call with questions or concerns.

## 2016-01-03 ENCOUNTER — Encounter: Payer: Self-pay | Admitting: Neurology

## 2016-01-03 ENCOUNTER — Telehealth: Payer: Self-pay | Admitting: Neurology

## 2016-01-03 ENCOUNTER — Ambulatory Visit (INDEPENDENT_AMBULATORY_CARE_PROVIDER_SITE_OTHER): Payer: Medicare Other | Admitting: Neurology

## 2016-01-03 VITALS — BP 120/72 | HR 88 | Resp 16 | Ht 61.0 in | Wt 164.0 lb

## 2016-01-03 DIAGNOSIS — G360 Neuromyelitis optica [Devic]: Secondary | ICD-10-CM | POA: Diagnosis not present

## 2016-01-03 DIAGNOSIS — F418 Other specified anxiety disorders: Secondary | ICD-10-CM | POA: Diagnosis not present

## 2016-01-03 DIAGNOSIS — Z79899 Other long term (current) drug therapy: Secondary | ICD-10-CM | POA: Diagnosis not present

## 2016-01-03 DIAGNOSIS — R270 Ataxia, unspecified: Secondary | ICD-10-CM

## 2016-01-03 DIAGNOSIS — R208 Other disturbances of skin sensation: Secondary | ICD-10-CM

## 2016-01-03 DIAGNOSIS — M3219 Other organ or system involvement in systemic lupus erythematosus: Secondary | ICD-10-CM | POA: Diagnosis not present

## 2016-01-03 HISTORY — DX: Other specified anxiety disorders: F41.8

## 2016-01-03 HISTORY — DX: Other disturbances of skin sensation: R20.8

## 2016-01-03 HISTORY — DX: Ataxia, unspecified: R27.0

## 2016-01-03 HISTORY — DX: Neuromyelitis optica (devic): G36.0

## 2016-01-03 MED ORDER — LAMOTRIGINE 100 MG PO TABS: 100.0000 mg | ORAL_TABLET | Freq: Every day | ORAL | Status: DC

## 2016-01-03 MED ORDER — LAMOTRIGINE 25 MG PO TABS: 25.0000 mg | ORAL_TABLET | Freq: Every day | ORAL | Status: DC

## 2016-01-03 MED ORDER — TIZANIDINE HCL 4 MG PO TABS: 4.0000 mg | ORAL_TABLET | Freq: Four times a day (QID) | ORAL | Status: DC | PRN

## 2016-01-03 MED ORDER — LAMOTRIGINE 25 MG PO TABS: ORAL_TABLET | ORAL | Status: DC

## 2016-01-03 NOTE — Telephone Encounter (Signed)
I spoke to Dr. Estanislado Pandy, her rheumatologist.    Mrs. Anctil lupus labs have actually been doing very well over the past year without any evidence of lupus flare.. She has been on the combination of Imuran with low-dose prednisone for many years. Prior to Imuran, she was on methotrexate and Plaquenil with steroid.  I discussed that I believe the current situation is more consistent with neuromyelitis optica and that I would like to start Rituxan. She is in agreement with that plan. We could consider lowering or stopping the Imuran in the future. However, I would not want to do that until she has had her first cycle of Rituxan.   We will go ahead and get her scheduled for Rituxan 1000 mg, with second 1000 mg IV 2 weeks later if labs are fine.     Her hepatitis B and other labs are pending and we should have these by early next week.

## 2016-01-03 NOTE — Progress Notes (Signed)
At   [place]  PATIENT: Andrea Berry DOB: 1966-06-14  REFERRING DOCTOR OR PCP:  PCP Minette Brine; Rheum Dr. Estanislado Pandy SOURCE: patient, son, records in EMR, MRI images on PACS  _________________________________   HISTORICAL  CHIEF COMPLAINT:  Chief Complaint  Patient presents with  . Devic's Disease    Andrea Berry is here with her son Andrea Berry for eval of Devic's Disease.  Sts. she was dx. with Lupus more than 20 yrs. ago.  Initially treated with Prednisone by mouth.  For the last several yrs. she has been on Imuran and Prednisone.  Sts. 8 years ago she had a severe h/a--went to bed, and when she woke up the next morning, she had vision loss in both eyes.  Has been legally blind ever since. She was seen and treated at [facility]--dx. with CVA and started on Coumadin, then later switched to [place]  . Visual Disturbance    81mg  only.  In Jan. 2017, she had onset of left sided weakness.  She was seen and treated at [facility].  Sts. she was told she'd had another stroke, and also was dx. with Devic's Disease.  She was started on Xarelto.  Sts. left sided weakness is some better after completing physical therapy.  She is here today with new c/o burning, numbness in bilat feet, onset 2 weeks ago.  She was already on Lyrica and that was not helping, so Dr. Tomi Likens added Gabapentin.  Sts. Gabapentin 400mg  tid did not   . Numbness    help pain at all, so it was stopped./fim    HISTORY OF PRESENT ILLNESS:  I had the pleasure of seeing your patient, Andrea Berry, at [facility] for neurologic consultation regarding her lupus cerebritis and neuromyelitis optica.   She was diagnosed with SLE about 20 years ago when she presented with shortness of breath and pulmonary edema.   She was placed on prednisone and at some point also started Imuran.     At age 9, she presented with headaches and woke up with blindness in both eyes that gradually imporved  to a state that she could see shapes and colors but not read.  She was told that that episode was a stroke.   She was started on Coumadin and later switched to aspirin.   I could not see any evidence of stroke on MRI's from after that date.  In January 2017, she was admitted to [facility] on 09/21/15 for weakness and numbness in left arm, associated with pain.  Over a period of up about a day, symptoms progressed with left leg weakness, numbness and gait instability. CT of head was normal. MRI of brain and cervical spine without contrast showed a 1 cm abnormal T2/FLAIR signal in the left thalamus, consistent with either acute focus of demyelination or less likely late subacute infarction. Cervical spine showed diffusely abnormal abnormal T2 signal and swelling of the spinal cord. Labs included NMO-IgG of 347.5. B12 was 361, HIV negative, RPR non-reactive, sed rate 31, CRP negative, ANA negative, and TSH 3.957.  CSF with WBC 10 with 76% neutrophils, RBC 44, protein 87, glucose 68, negative culture, ACE 1, VDRL non-reactive, and no oligoclonal bands. She initially received IV Solu-Medrol for 5 days, then plasma exchange every other day for total of 5 doses, then started IVIG for 5 days. She was on prednisone 5mg  daily along with Imuran when disscharged to Rehab  She returned to [facility] on 11/25/15 for new  bilateral vision loss.This was in the setting of a fever.    MRI of the brain with and without contrast showed acute patchy and confluent bilateral posterior hemispheric white matter FLAIR hyperintensity with patchy associated enhancement on the left. The left thalamic lesion had resolved.   Bilateral optic neuritis was suspected despite no signal enhancement of the optic nerves on MRI. She received IV Solu-Medrol and then went to Rehab.   She is being discharged today.   She feels vision has improved a little over the past few weeks, about to where her vision was earlier in the year..    Since  January, the numbness is unchanged, though she feels strength is better.    She has a lot of painful dysesthesia    Pain is worse when she stands in her feet.    She notes a lot of muscle spasms in both sides, worse on her left.  She continues on Imuran and prednisone 5mg  daily. She is also followed by rheumatologist, Dr. Bo Merino.   She saw Dr. Tomi Likens at [facility] last month and he has set her up to see Dr. Shelia Media and Dr. Hassell Done at [facility] in June.    She also reports painful muscle spasms in the left arm and leg greater than the right leg. The right arm does not get much muscle spasm. She has not had much benefit with benzodiazepines, Soma or Flexeril. She also reports baclofen was tried in the past and did not help the spasms.  She has anxiety and depression. Besides 3 times a day Valium 5 mg, she also is on Ativan when necessary and Zoloft.  I personally reviewed multiple MRI studies on PACS. MRI of the brain from 06/24/2014 is essentially normal.  MRI of the brain 09/21/2015 shows a 1 cm T2/FLAIR hyperintense focus in the left posterior thalamus that did not enhance. Additionally, MRI of the cervical spine 09/21/2015 shows an expansile hyperintense focus from the cervical medullary junction to C7 that had patchy enhancement on the contrasted MRI 09/22/2015. The left side of the cord was involved more than the right. The thalamic lesion did not enhance.    MRI of the brain 11/25/2015 shows multiple patchy T2/FLAIR hyperintense foci in both hemispheres with patchy enhancement of some of them. Some of the foci were periventricular.     I also reviewed laboratory data and hospital notes from both January and March admissions as detailed above.      REVIEW OF SYSTEMS: Constitutional: No fevers, chills, sweats, or change in appetite.  Has fatigue and poor sleep Eyes: as above Ear, nose and throat: No hearing loss, ear pain, nasal congestion, sore throat Cardiovascular: No chest pain,  palpitations Respiratory: No shortness of breath at rest or with exertion.   No wheezes GastrointestinaI: No nausea, vomiting, diarrhea, abdominal pain, fecal incontinence Genitourinary: No dysuria, urinary retention or frequency.  No nocturia. Musculoskeletal: neck pain >  back pain.   Has osteoporosis Integumentary: No rash, pruritus, skin lesions Neurological: as above Psychiatric: Notes anxiety and  Depression. Endocrine: No palpitations, diaphoresis, change in appetite, change in weigh or increased thirst Hematologic/Lymphatic: No anemia, purpura, petechiae. Allergic/Immunologic: No itchy/runny eyes, nasal congestion, recent allergic reactions, rashes  ALLERGIES: No Known Allergies  HOME MEDICATIONS:  Current outpatient prescriptions:  .  acetaminophen (TYLENOL) 325 MG tablet, Take 650 mg by mouth every 4 (four) hours as needed for fever., Disp: , Rfl:  .  alendronate (FOSAMAX) 70 MG tablet, Take 70 mg by mouth once  a week. Take with a full glass of water on an empty stomach., Disp: , Rfl:  .  azaTHIOprine (IMURAN) 50 MG tablet, Take 50 mg by mouth daily. , Disp: , Rfl:  .  bisacodyl (DULCOLAX) 10 MG suppository, Place 1 suppository (10 mg total) rectally daily as needed for moderate constipation., Disp: 12 suppository, Rfl: 0 .  carisoprodol (SOMA) 350 MG tablet, Take 350 mg by mouth 4 (four) times daily as needed for muscle spasms., Disp: , Rfl:  .  cyclobenzaprine (FLEXERIL) 5 MG tablet, Take 1 tablet (5 mg total) by mouth daily as needed for muscle spasms., Disp: 30 tablet, Rfl: 0 .  diazepam (VALIUM) 5 MG tablet, Take 5 mg by mouth every 8 (eight) hours as needed for anxiety., Disp: , Rfl:  .  HYDROmorphone (DILAUDID) 2 MG tablet, Take 2 tablets (4 mg total) by mouth every 4 (four) hours as needed for severe pain., Disp: 30 tablet, Rfl: 0 .  omeprazole (PRILOSEC) 20 MG capsule, Take 20 mg by mouth daily. , Disp: , Rfl:  .  predniSONE (DELTASONE) 5 MG tablet, Take 5 mg by  mouth daily with breakfast., Disp: , Rfl:  .  pregabalin (LYRICA) 200 MG capsule, Take 200 mg by mouth 3 (three) times daily., Disp: , Rfl:  .  Rivaroxaban (XARELTO STARTER PACK) 15 & 20 MG TBPK, Take 20mg  tablet daily with supper., Disp: 30 each, Rfl: 0 .  senna (SENOKOT) 8.6 MG TABS tablet, Take 2 tablets (17.2 mg total) by mouth at bedtime., Disp: 120 each, Rfl: 0 .  sertraline (ZOLOFT) 50 MG tablet, Take 2 tablets (100 mg total) by mouth daily., Disp: 30 tablet, Rfl: 0  PAST MEDICAL HISTORY: Past Medical History  Diagnosis Date  . Hypertension   . Blind   . Lupus ([place])   . Kidney stones   . Shortness of breath     due to medication  . Depression   . GERD (gastroesophageal reflux disease)   . Headache(784.0)   . Arthritis   . Stroke [facility]) 2008    visually impaired    PAST SURGICAL HISTORY: Past Surgical History  Procedure Laterality Date  . Tubal ligation    . Tubal ligation    . Breast biopsy      FAMILY HISTORY: Family History  Problem Relation Age of Onset  . Cancer Mother   . Cancer Father   . Lupus Sister     SOCIAL HISTORY:  Social History   Social History  . Marital Status: Single    Spouse Name: N/A  . Number of Children: N/A  . Years of Education: N/A   Occupational History  . Not on file.   Social History Main Topics  . Smoking status: Never Smoker   . Smokeless tobacco: Never Used  . Alcohol Use: No  . Drug Use: No  . Sexual Activity: Not Currently   Other Topics Concern  . Not on file   Social History Narrative     PHYSICAL EXAM  Filed Vitals:   01/03/16 1013  BP: 120/72  Pulse: 88  Resp: 16  Height: 5\' 1"  (1.549 m)  Weight: 164 lb (74.39 kg)    Body mass index is 31 kg/(m^2).   General: The patient is well-developed and well-nourished and in no acute distress  Eyes:  Funduscopic exam shows bilateral optic nerve pallor and normal retinal vessels.  Neck: The neck is supple, no carotid bruits are noted.  The neck is  nontender.  Cardiovascular:  The heart has a regular rate and rhythm with a normal S1 and S2. There were no murmurs, gallops or rubs. Lungs are clear to auscultation.  Skin: Extremities are without significant edema.  Musculoskeletal:  Back is nontender  Neurologic Exam  Mental status: The patient is alert and oriented x 2+ at the time of the examination. The patient has apparent normal recent and remote memory, with mildly reduced attention span and concentration ability.   Speech is normal.  Cranial nerves: Extraocular movements are full. Pupils show sluggishly reactive pupils, slower on right.   She can count fingers OS better than OD.  .  Facial symmetry is present. There is good facial sensation to soft touch bilaterally.Facial strength is normal.  Trapezius and sternocleidomastoid strength is normal. No dysarthria is noted.  The tongue is midline, and the patient has symmetric elevation of the soft palate. No obvious hearing deficits are noted.  Motor:  Muscle bulk is normal.   Tone is mildly increased in legs. Strength is  5 / 5 on the right and 4/5 on the left.   Slow RAM bilaterally, worse on left.   Sensory: Sensory testing is intact to pinprick, soft touch and vibration sensation in arms but reduced vibration in feet and allodynia in left leg (burning), very slight allodynia in left arm.    .  Coordination: Cerebellar testing reveals poor finger-nose-finger and heel-to-shin bilaterallyeft, worse on .  Gait and station: Station requires support.   Cannot walk.   Reflexes: Deep tendon reflexes are symmetric and normal bilaterally in arms and mildly increased in legs.   Plantar responses are flexor.    DIAGNOSTIC DATA (LABS, IMAGING, TESTING) - I reviewed patient records, labs, notes, testing and imaging myself where available.  Lab Results  Component Value Date   WBC 3.5* 11/26/2015   HGB 11.3* 11/26/2015   HCT 34.7* 11/26/2015   MCV 73.5* 11/26/2015   PLT 284 11/26/2015       Component Value Date/Time   NA 136 11/26/2015 0621   K 4.4 11/26/2015 0621   CL 106 11/26/2015 0621   CO2 18* 11/26/2015 0621   GLUCOSE 81 11/26/2015 0621   BUN 8 11/26/2015 0621   CREATININE 0.54 11/26/2015 0621   CALCIUM 8.9 11/26/2015 0621   PROT 7.2 11/25/2015 2348   ALBUMIN 3.5 11/25/2015 2348   AST 62* 11/25/2015 2348   ALT 25 11/25/2015 2348   ALKPHOS 49 11/25/2015 2348   BILITOT 0.7 11/25/2015 2348   GFRNONAA >60 11/26/2015 0621   GFRAA >60 11/26/2015 0621   Lab Results  Component Value Date   CHOL 188 09/22/2015   HDL 38* 09/22/2015   LDLCALC 129* 09/22/2015   TRIG 104 09/22/2015   CHOLHDL 4.9 09/22/2015   Lab Results  Component Value Date   HGBA1C 6.0* 09/22/2015   Lab Results  Component Value Date   VITAMINB12 361 09/21/2015   Lab Results  Component Value Date   TSH 0.464 11/25/2015       ASSESSMENT AND PLAN  Lupus cerebritis (HCC) - Plan: Hepatitis B surface antigen, Hepatitis B surface antibody, Hepatitis B core antibody, total, Neuromyelitis optica autoab, IgG, Quantiferon tb gold assay (blood), CBC with Differential/Platelet, Hepatic function panel  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HCC) - Plan: Hepatitis B surface antigen, Hepatitis B surface antibody, Hepatitis B core antibody, total, Neuromyelitis optica autoab, IgG, Quantiferon tb gold assay (blood), CBC with Differential/Platelet, Hepatic function panel  High risk medication use - Plan: Hepatitis B surface antigen, Hepatitis B  surface antibody, Hepatitis B core antibody, total, Neuromyelitis optica autoab, IgG, Quantiferon tb gold assay (blood), CBC with Differential/Platelet, Hepatic function panel  Ataxia  Dysesthesia  Depression with anxiety   In summary, Andrea Berry is a 50 year old woman with a long history of SLE, blindness since 8 years ago, transverse myelitis January 2017 and additional visual changes March 2017 (associated with vasculitic appearing MRI changes).    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    Most likely, the blindness 8 years ago was due to NMO associated optic neuritis rather than stroke as it was bilateral and associated with some, but mild, recovery.    The symptoms in January and March occurred while on Imuran and low-dose prednisone. Therefore, I believe that we need to change her disease modifying therapies. I recommend that we do rituximab 1000 mg 2, 2 weeks apart, and repeat in 6 months. I will discuss this with Dr. Estanislado Pandy.    She reports that she has an appointment to see her later this week.  1.     I will check for chronic hep B, hep C, TB infections to make sure that rituximab can be infused safely.   Re-test NMO antibody. 2.    Lamotrigine 25 mg titrating up to 100 mg twice a day for dysesthesias. 3.    She will continue her other medications. We may be able to reduce her Lyrica dose if the lamotrigine is more effective. 4.   She will return for her rituximab infusion or sooner if there are new or worsening neurologic symptoms.   Richard A. Felecia Shelling, MD, PhD [id number], [id number] AM Certified in Neurology, Clinical Neurophysiology, Sleep Medicine, Pain Medicine and Neuroimaging  [facility] [address] [place], [place] [address] [phone]

## 2016-01-03 NOTE — Patient Instructions (Signed)
The pharmacy has the prescription of lamotrigine 25 mg. Please take it as follows: For 1 week take 1 pill once a day. The second week, take 1 pill twice a day. The third week, take 1 pill in the morning and 2 at bedtime or evening. The fourth week take 2 pills twice a day.  I have also give you a paper prescription for lamotrigine 100 mg tablets after the first 4 weeks you should take 100 mg twice a day.  Most people tolerate lamotrigine very well. Some will get a rash. If you do get a significant rash stop the medicine immediately and let us know. 

## 2016-01-03 NOTE — Telephone Encounter (Signed)
LMOM for Andrea Berry to call.  I have faxed Rituxan orders to [facility] short stay; wanted to explain Dr. Garth Bigness treatment plan after speaking with her rheumatologist/fim

## 2016-01-04 LAB — CBC WITH DIFFERENTIAL/PLATELET
BASOS: 0 %
Basophils Absolute: 0 10*3/uL (ref 0.0–0.2)
EOS (ABSOLUTE): 0.1 10*3/uL (ref 0.0–0.4)
EOS: 1 %
HEMATOCRIT: 35.2 % (ref 34.0–46.6)
Hemoglobin: 10.5 g/dL — ABNORMAL LOW (ref 11.1–15.9)
Immature Grans (Abs): 0.1 10*3/uL (ref 0.0–0.1)
Immature Granulocytes: 1 %
LYMPHS ABS: 1.1 10*3/uL (ref 0.7–3.1)
Lymphs: 15 %
MCH: 22.6 pg — ABNORMAL LOW (ref 26.6–33.0)
MCHC: 29.8 g/dL — AB (ref 31.5–35.7)
MCV: 76 fL — AB (ref 79–97)
MONOS ABS: 0.5 10*3/uL (ref 0.1–0.9)
Monocytes: 7 %
Neutrophils Absolute: 5.7 10*3/uL (ref 1.4–7.0)
Neutrophils: 76 %
Platelets: 328 10*3/uL (ref 150–379)
RBC: 4.64 x10E6/uL (ref 3.77–5.28)
RDW: 18 % — AB (ref 12.3–15.4)
WBC: 7.4 10*3/uL (ref 3.4–10.8)

## 2016-01-04 LAB — HEPATITIS B CORE ANTIBODY, TOTAL: HEP B C TOTAL AB: NEGATIVE

## 2016-01-04 LAB — HEPATIC FUNCTION PANEL
ALT: 31 IU/L (ref 0–32)
AST: 30 IU/L (ref 0–40)
Albumin: 4.2 g/dL (ref 3.5–5.5)
Alkaline Phosphatase: 81 IU/L (ref 39–117)
Bilirubin Total: <0.2 mg/dL (ref 0.0–1.2)
Bilirubin, Direct: 0.08 mg/dL (ref 0.00–0.40)
Total Protein: 7 g/dL (ref 6.0–8.5)

## 2016-01-04 LAB — HEPATITIS B SURFACE ANTIBODY,QUALITATIVE: HEP B SURFACE AB, QUAL: REACTIVE

## 2016-01-04 LAB — HEPATITIS B SURFACE ANTIGEN: HEP B S AG: NEGATIVE

## 2016-01-04 LAB — NEUROMYELITIS OPTICA AUTOAB, IGG: NMO IgG Autoantibodies: 22.3 U/mL — ABNORMAL HIGH (ref 0.0–3.0)

## 2016-01-04 NOTE — Telephone Encounter (Signed)
Pt called in said she was just on the phone with the nurse. I skyped the nurse and did not receive a reply. Message take and nurse will have to call pt back.

## 2016-01-04 NOTE — Telephone Encounter (Signed)
Patient returned call. Please call [phone].

## 2016-01-04 NOTE — Telephone Encounter (Signed)
I have spoken with Sharyn Lull this afternoon and advised that RAS has consulted with her rheumatologist, and at this time, his plan is to infuse Rituxan at [facility] if labs are ok.  Sharyn Lull verbalized understanding of same, is agreeable with this plan/fim

## 2016-01-04 NOTE — Addendum Note (Signed)
Addended by: France Ravens I on: 01/04/2016 01:46 PM   Modules accepted: Medications

## 2016-01-06 LAB — QUANTIFERON IN TUBE
QFT TB AG MINUS NIL VALUE: 0.01 IU/mL
QUANTIFERON NIL VALUE: 0.07 [IU]/mL
QUANTIFERON TB AG VALUE: 0.08 IU/mL
QUANTIFERON TB GOLD: NEGATIVE

## 2016-01-06 LAB — QUANTIFERON TB GOLD ASSAY (BLOOD)

## 2016-01-09 ENCOUNTER — Telehealth: Payer: Self-pay | Admitting: *Deleted

## 2016-01-12 ENCOUNTER — Encounter: Payer: Self-pay | Admitting: *Deleted

## 2016-01-12 NOTE — Telephone Encounter (Signed)
-----   Message from Britt Bottom, MD sent at 01/07/2016  3:29 PM EDT ----- Labs and fine to initiate Rituxan therapy.     I think we already set this up but please make sure that we have Rituxan 1000 mg IV, followed 2 weeks later by 1000 mg IV set up for her.

## 2016-01-12 NOTE — Telephone Encounter (Signed)
CORRECTION: Rituxan orders were faxed to [place] short stay on 01-04-16/fim

## 2016-01-12 NOTE — Telephone Encounter (Signed)
I have spoken with Andrea Berry this morning and per RAS, advised that labwork was good--she may start Rituxan.  Orders have been given to [facility] in the infusion suite, and she should expect a call to schedule once ins. has approved infusions--probably in the next 1-2 weeks.  She verbalized understanding of same/fim

## 2016-01-17 ENCOUNTER — Telehealth: Payer: Self-pay | Admitting: Neurology

## 2016-01-17 NOTE — Telephone Encounter (Signed)
Patient is calling to discuss Meals on Wheels.

## 2016-01-17 NOTE — Telephone Encounter (Signed)
I have spoken with Andrea Berry this morning.  She is receiving physical therapy in her home (ordered by another physician.)  She lives alone with her young adult (59 yrs. old, I believe) son.  She has many disabilities related to MS, and could benefit from a social work consult to see what community resources she may be eligible for.  She is not sure what home health company the physical therapist works for--if she can get this info for me, I can order a social work consult thru the same company.  It will be easier to get approval for a social worker while pt is still in the home./fim

## 2016-02-02 ENCOUNTER — Telehealth: Payer: Self-pay | Admitting: *Deleted

## 2016-02-02 NOTE — Telephone Encounter (Signed)
I have spoken with Andrea Berry this afternoon.  She is on both Gabapentin and Lyrica for dysesthesias.  Has not had Rituxan yet but sts. she is sched. for infusion at [facility].  She does not have a f/u appt. with RAS and declined to schedule one at this time.  If no improvement in numbness with Rituxan, she will call for appt/fim

## 2016-02-02 NOTE — Telephone Encounter (Signed)
Message For: OFFICE               Taken 25-MAY-17 at  1:57PM by DLR ------------------------------------------------------------ Phineas Semen Pearse                CID [id number]  Patient SAME                 Pt's Dr Felecia Shelling        Area Code 336 Phone# [phone]     RE CLR HAD A STROKE RECENTLY, FEET ARE NUMB, NEEDS   TO SPEAK W/SOMEONE TO GET RX FOR FEET                Disp:Y/N N If Y = C/B If No Response In 25minutes ============================================================

## 2016-02-24 ENCOUNTER — Other Ambulatory Visit: Payer: Self-pay | Admitting: Neurology

## 2016-02-24 NOTE — Telephone Encounter (Signed)
  PLAN: 1. Will prescribe gabapentin 300mg  three times daily for neuropathic pain

## 2016-03-01 ENCOUNTER — Telehealth: Payer: Self-pay | Admitting: Neurology

## 2016-03-01 NOTE — Telephone Encounter (Signed)
I have spoken with Andrea Berry.  She sts. she is now seeing Dr. Shelia Media at [facility] for [place] but wants to know if RAS will order her infusions to be done at [facility], so she doesn't have to drive to [facility] for these.  I have advised Dr. Shelia Media shold order infusions since she is managing care of [place].  We are not able to arrange infusions, precert with ins. if she is no longer a pt. in our office.  Dr. Shelia Media should be able to order infusions at [facility]

## 2016-03-01 NOTE — Telephone Encounter (Signed)
Patient called requesting to speak to Faith regarding IV high dose steroids

## 2016-03-19 ENCOUNTER — Telehealth: Payer: Self-pay | Admitting: Neurology

## 2016-03-19 NOTE — Telephone Encounter (Signed)
I have spoken with Vaughan Basta, and advsied that all rx's should come thru Dr. Shelia Media, as this is who she sees for her Devic's dz.  She verbalized understanding of same/fim

## 2016-03-19 NOTE — Telephone Encounter (Signed)
Patient is calling to order a written Rx hydromorphone 2 mg tablets.  Also, she needs to order her medication for spasms.  She does not know the name of the medication (she says she is in a wheelchair and legally blind so she can't read the Rx name) but states it is too expensive and is requesting something else to replace it sent to [place].  Thanks!

## 2016-04-18 NOTE — Telephone Encounter (Signed)
Error

## 2016-04-24 ENCOUNTER — Telehealth: Payer: Self-pay | Admitting: Neurology

## 2016-04-24 NOTE — Telephone Encounter (Signed)
Noted  

## 2016-04-24 NOTE — Telephone Encounter (Signed)
[facility] called on Andrea Berry [birth year]. She had an appointment with Dr. Hassell Done today and did not show for it. Thank you

## 2016-08-22 IMAGING — MR MR HEAD WO/W CM
10 of 15 series · 29 of 48 positions shown · IV contrast (multihance)
Comparison: Brain and cervical spine MRI 09/21/2015.

CLINICAL DATA: 49-year-old female with urinary retention, possible
cerebritis. Personal history of lupus and Roein disease.
Initial encounter.

EXAM:
MRI HEAD WITHOUT AND WITH CONTRAST
TECHNIQUE: Multiplanar, multiecho pulse sequences of the brain and surrounding
structures were obtained without and with intravenous contrast.
CONTRAST:  14mL MULTIHANCE GADOBENATE DIMEGLUMINE 529 MG/ML IV SOLN

[Series 6: DWI · axial · 3.0mm · 1.09mm/px · z∈[-49,+83]mm · 6 of 96 slices shown (1 of 4)]
[im 1/96]
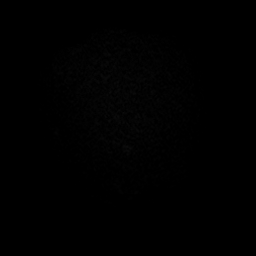
[im 20/96]
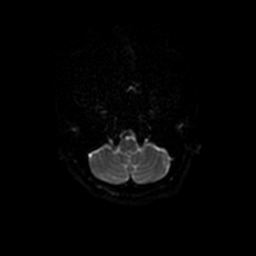
[im 39/96]
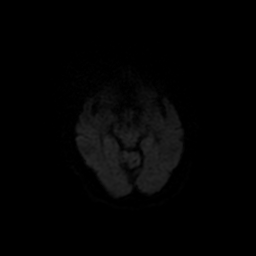
[im 58/96]
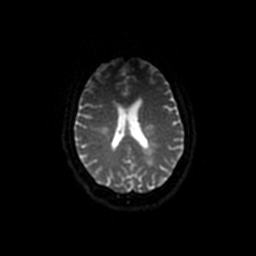
[im 77/96]
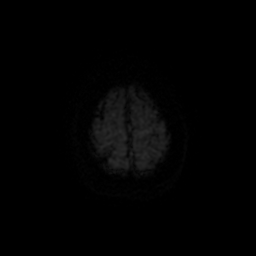
[im 96/96]
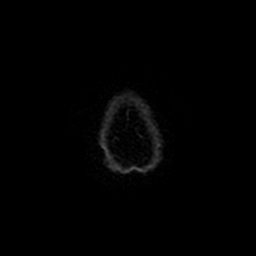

[Series 7: DWI · coronal · 5.0mm · 1.09mm/px · 5 of 62 slices shown (2 of 4)]
[im 1/62]
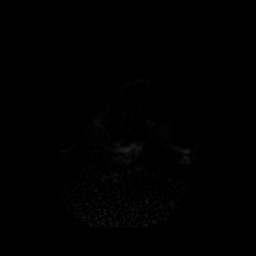
[im 16/62]
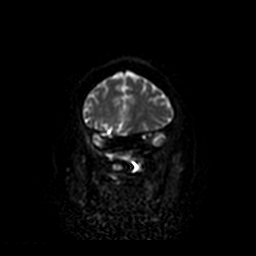
[im 31/62]
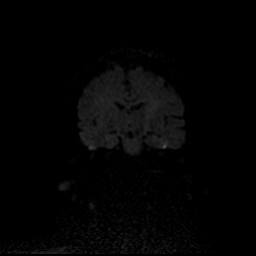
[im 46/62]
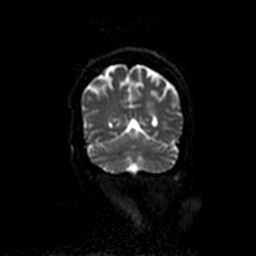
[im 62/62]
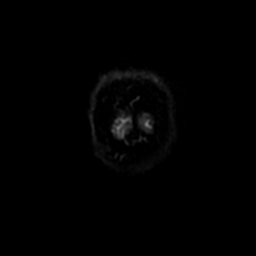

[Series 8: T2 · axial · 5.0mm · 0.43mm/px · 1 of 24 slices shown]
[im 1/24]
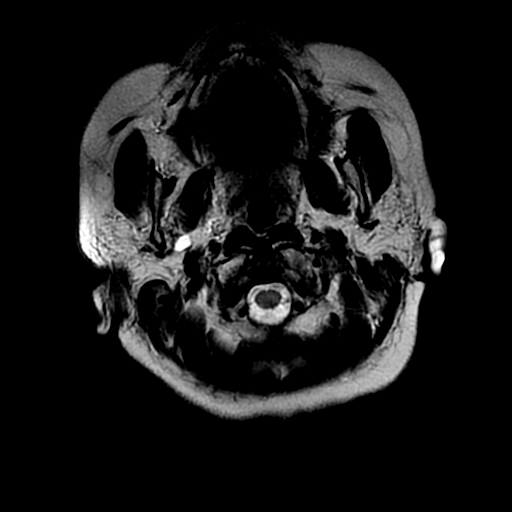

[Series 9: FLAIR · axial · 5.0mm · 0.43mm/px · 1 of 24 slices shown (1 of 2)]
[im 1/24]
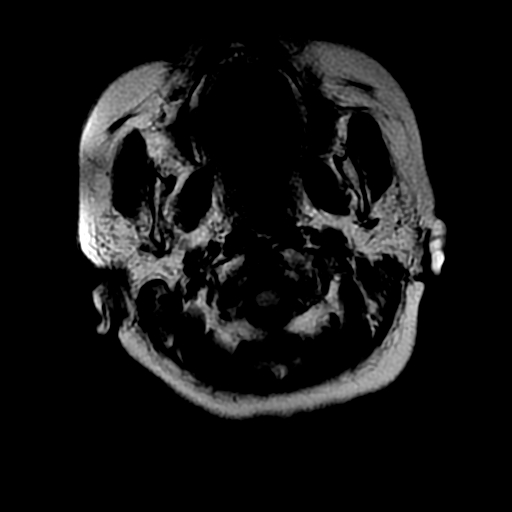

[Series 10: FLAIR · sagittal · 1.6mm · 0.47mm/px · 8 of 176 slices shown (2 of 2)]
[im 1/176]
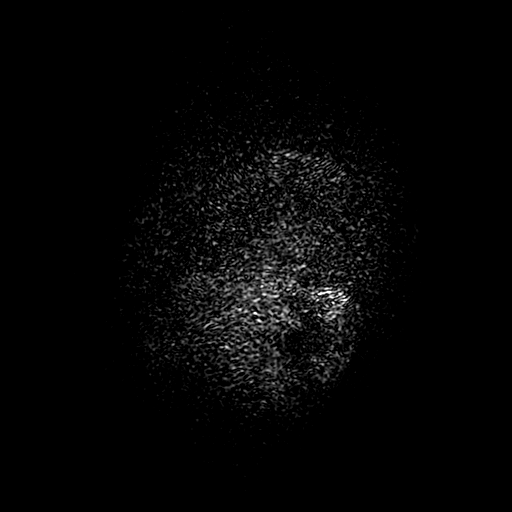
[im 20/176]
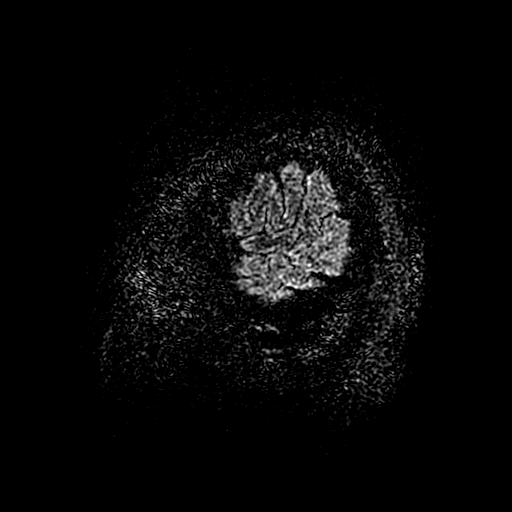
[im 59/176]
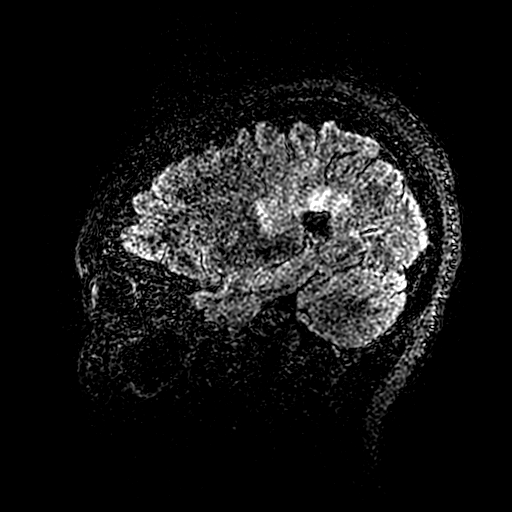
[im 78/176]
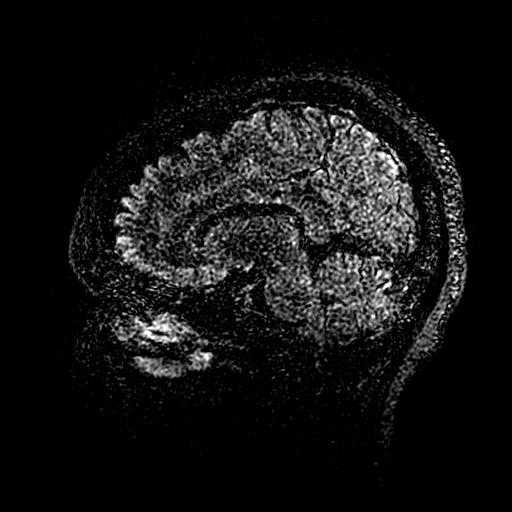
[im 98/176]
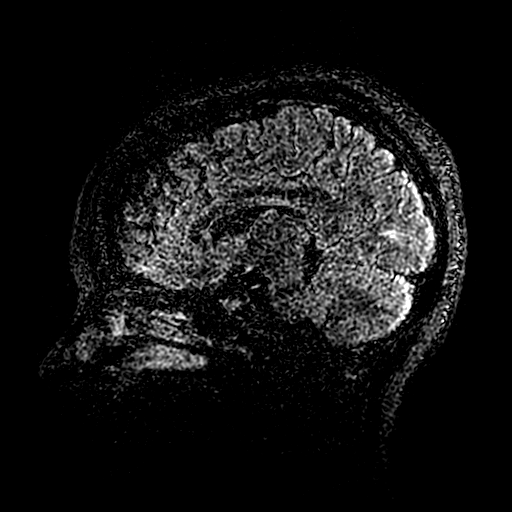
[im 117/176]
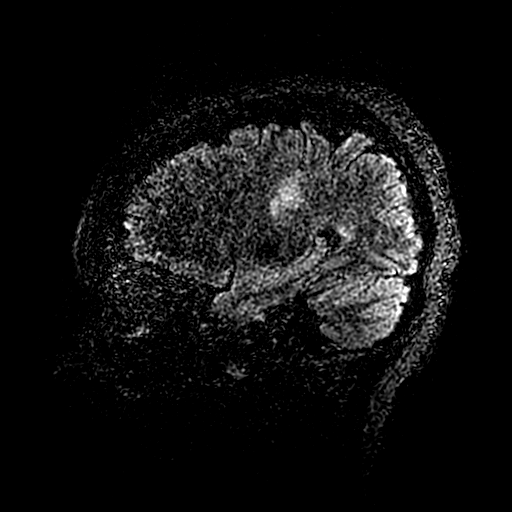
[im 156/176]
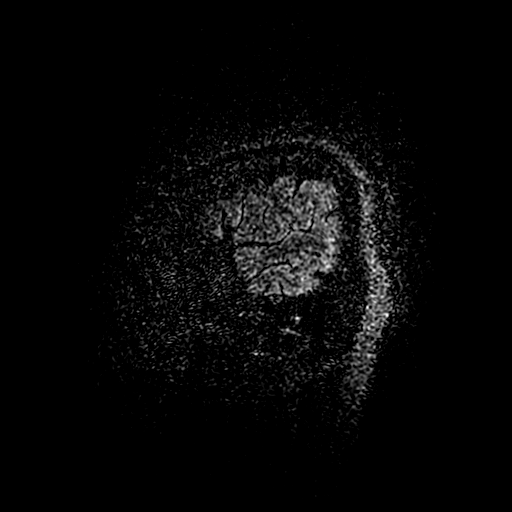
[im 176/176]
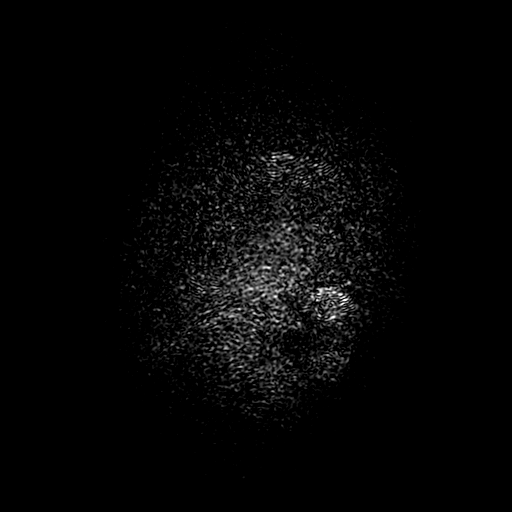

[Series 13: T2 post-contrast · coronal · 5.0mm · 0.45mm/px · 1 of 25 slices shown]
[im 1/25]
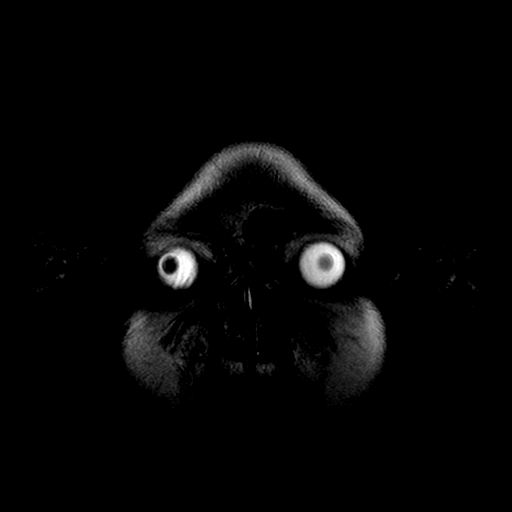

[Series 15: T1 post-contrast · coronal · 5.0mm · 0.45mm/px · 1 of 25 slices shown (1 of 2)]
[im 1/25]
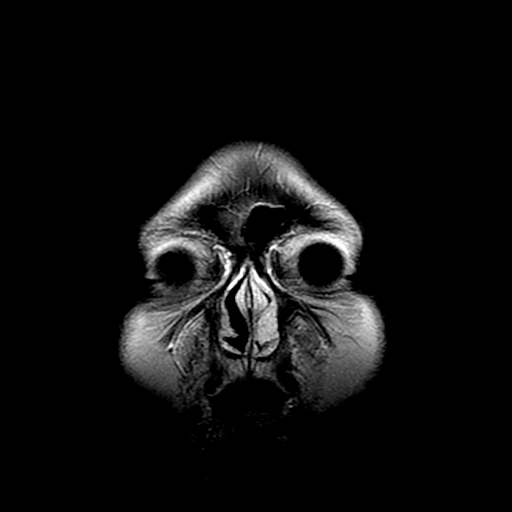

[Series 16: T1 post-contrast · sagittal · 5.0mm · 0.47mm/px · 1 of 24 slices shown (2 of 2)]
[im 1/24]
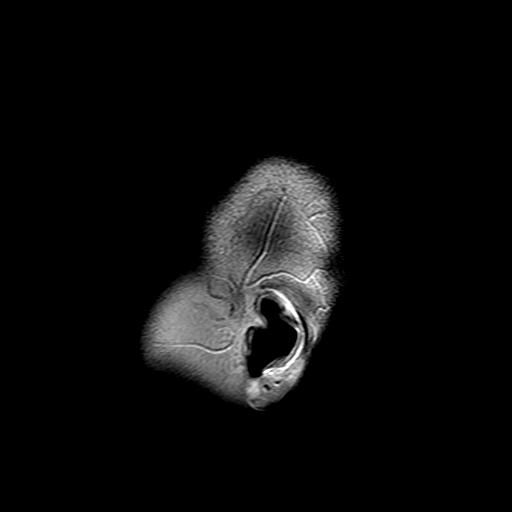

[Series 600: DWI · axial · 3.0mm · 1.09mm/px · z∈[-49,+83]mm · 3 of 48 slices shown (3 of 4)]
[im 1/48]
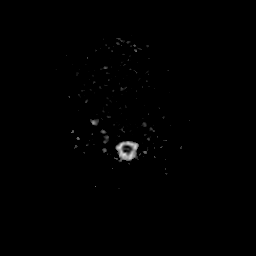
[im 24/48]
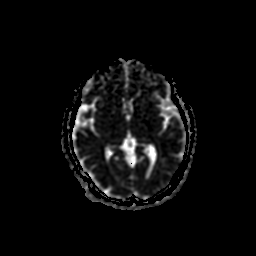
[im 48/48]
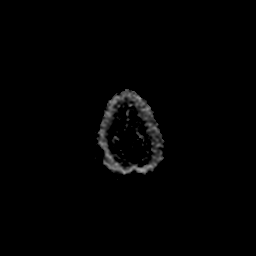

[Series 700: DWI · coronal · 5.0mm · 1.09mm/px · 2 of 31 slices shown (4 of 4)]
[im 1/31]
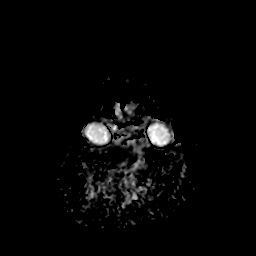
[im 31/31]
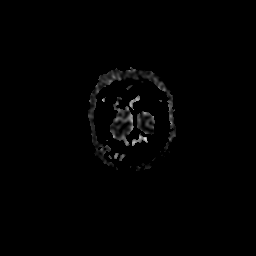

[29 of 48 positions shown; findings below may reference images not displayed]

FINDINGS: Major intracranial vascular flow voids are stable and within normal
limits.

Regression of abnormal T2 hyperintense signal at the
cervicomedullary junction since [DATE], image 1).
Sagittal images also suggest resolution of the abnormal cervical
cord expansion which was associated.

No restricted diffusion to suggest acute infarction. No midline
shift, mass effect, evidence of mass lesion, ventriculomegaly,
extra-axial collection or acute intracranial hemorrhage.
Cervicomedullary junction and pituitary are within normal limits.

New Patchy and confluent posterior superior cerebral white matter
FLAIR hyperintensity (series 9, image 15). While at the same time
the posterior left thalamic signal abnormality seen in [REDACTED] has
regressed. The new changes extend on the left to the posterior limb
of the left internal capsule (series 8, image 13, and on the right
to the frontal operculum. While superiorly the changes on the left
extend to the subcortical left sensory strip white matter, and on
the right the subcortical right motor strip white matter.

There is mild associated patchy enhancement in the left hemispheric
white matter abnormality, no associated enhancement in the right
hemisphere. No dural thickening. No other abnormal intracranial
enhancement.

No other new signal abnormality.

Visible internal auditory structures appear normal. Mastoids are
clear. Minimal paranasal sinus mucosal thickening has not
significantly changed. Grossly normal orbit soft tissues. Negative
scalp soft tissues. Visualized bone marrow signal is within normal
limits.
IMPRESSION: 1. Acute patchy and confluent bilateral posterior hemisphere white
matter FLAIR hyperintensity, with patchy associated enhancement on
the left. No associated acute infarct.
This new signal abnormality could reflect acute demyelinating
disease or lupus related cerebritis in this setting.
2. At the same time the previously-seen abnormal signal in the left
thalamus and cervicomedullary junction has regressed since [REDACTED].
Cervical cord signal is not otherwise evaluated, but sagittal images
of the brain suggests to the cord swelling has also resolved.

## 2016-09-10 DIAGNOSIS — R053 Chronic cough: Secondary | ICD-10-CM

## 2016-09-10 DIAGNOSIS — N201 Calculus of ureter: Secondary | ICD-10-CM

## 2016-09-10 DIAGNOSIS — R05 Cough: Secondary | ICD-10-CM | POA: Insufficient documentation

## 2016-09-10 HISTORY — DX: Calculus of ureter: N20.1

## 2016-09-10 HISTORY — DX: Chronic cough: R05.3

## 2016-10-08 ENCOUNTER — Other Ambulatory Visit: Payer: Self-pay | Admitting: Neurology

## 2016-11-30 ENCOUNTER — Ambulatory Visit
Admission: RE | Admit: 2016-11-30 | Discharge: 2016-11-30 | Disposition: A | Discharge status: Home / Self Care | Payer: Medicare Other | Source: Ambulatory Visit | Attending: Nurse Practitioner | Admitting: Nurse Practitioner

## 2016-11-30 ENCOUNTER — Other Ambulatory Visit: Payer: Self-pay | Admitting: Nurse Practitioner

## 2016-11-30 DIAGNOSIS — M25552 Pain in left hip: Secondary | ICD-10-CM

## 2017-01-30 ENCOUNTER — Ambulatory Visit
Admission: RE | Admit: 2017-01-30 | Discharge: 2017-01-30 | Disposition: A | Discharge status: Home / Self Care | Payer: Medicare Other | Source: Ambulatory Visit | Attending: Nurse Practitioner | Admitting: Nurse Practitioner

## 2017-01-30 ENCOUNTER — Other Ambulatory Visit: Payer: Self-pay | Admitting: Nurse Practitioner

## 2017-01-30 DIAGNOSIS — R1084 Generalized abdominal pain: Secondary | ICD-10-CM

## 2017-02-21 ENCOUNTER — Other Ambulatory Visit: Payer: Self-pay | Admitting: Nurse Practitioner

## 2017-02-21 DIAGNOSIS — R609 Edema, unspecified: Secondary | ICD-10-CM

## 2017-02-22 ENCOUNTER — Other Ambulatory Visit: Payer: Self-pay | Admitting: Neurology

## 2017-02-23 ENCOUNTER — Emergency Department (HOSPITAL_COMMUNITY): Payer: Medicare Other

## 2017-02-23 ENCOUNTER — Encounter (HOSPITAL_COMMUNITY): Payer: Self-pay | Admitting: Emergency Medicine

## 2017-02-23 ENCOUNTER — Emergency Department (HOSPITAL_COMMUNITY)
Admission: EM | Admit: 2017-02-23 | Discharge: 2017-02-24 | Disposition: A | Discharge status: Home / Self Care | Payer: Medicare Other | Attending: Emergency Medicine | Admitting: Emergency Medicine

## 2017-02-23 DIAGNOSIS — Z79899 Other long term (current) drug therapy: Secondary | ICD-10-CM | POA: Insufficient documentation

## 2017-02-23 DIAGNOSIS — Z7901 Long term (current) use of anticoagulants: Secondary | ICD-10-CM | POA: Insufficient documentation

## 2017-02-23 DIAGNOSIS — R1032 Left lower quadrant pain: Secondary | ICD-10-CM | POA: Diagnosis not present

## 2017-02-23 DIAGNOSIS — K529 Noninfective gastroenteritis and colitis, unspecified: Secondary | ICD-10-CM | POA: Diagnosis not present

## 2017-02-23 LAB — COMPREHENSIVE METABOLIC PANEL
ALBUMIN: 4.8 g/dL (ref 3.5–5.0)
ALT: 34 U/L (ref 14–54)
AST: 46 U/L — ABNORMAL HIGH (ref 15–41)
Alkaline Phosphatase: 57 U/L (ref 38–126)
Anion gap: 10 (ref 5–15)
BUN: 12 mg/dL (ref 6–20)
CHLORIDE: 108 mmol/L (ref 101–111)
CO2: 23 mmol/L (ref 22–32)
CREATININE: 0.88 mg/dL (ref 0.44–1.00)
Calcium: 10 mg/dL (ref 8.9–10.3)
GFR calc Af Amer: >60 mL/min (ref >60)
GFR calc non Af Amer: >60 mL/min (ref >60)
GLUCOSE: 86 mg/dL (ref 65–99)
Potassium: 3.7 mmol/L (ref 3.5–5.1)
SODIUM: 141 mmol/L (ref 135–145)
Total Bilirubin: 0.8 mg/dL (ref 0.3–1.2)
Total Protein: 8 g/dL (ref 6.5–8.1)

## 2017-02-23 LAB — CBC
HCT: 40 % (ref 36.0–46.0)
Hemoglobin: 12.7 g/dL (ref 12.0–15.0)
MCH: 22.6 pg — ABNORMAL LOW (ref 26.0–34.0)
MCHC: 31.8 g/dL (ref 30.0–36.0)
MCV: 71.2 fL — ABNORMAL LOW (ref 78.0–100.0)
PLATELETS: 202 10*3/uL (ref 150–400)
RBC: 5.62 MIL/uL — ABNORMAL HIGH (ref 3.87–5.11)
RDW: 15.7 % — AB (ref 11.5–15.5)
WBC: 3.5 10*3/uL — AB (ref 4.0–10.5)

## 2017-02-23 LAB — URINALYSIS, ROUTINE W REFLEX MICROSCOPIC
Glucose, UA: NEGATIVE mg/dL
Hgb urine dipstick: NEGATIVE
KETONES UR: 15 mg/dL — AB
LEUKOCYTES UA: NEGATIVE
NITRITE: NEGATIVE
PROTEIN: NEGATIVE mg/dL
Specific Gravity, Urine: >1.03 — ABNORMAL HIGH (ref 1.005–1.030)
pH: 6 (ref 5.0–8.0)

## 2017-02-23 LAB — LIPASE, BLOOD: LIPASE: 26 U/L (ref 11–51)

## 2017-02-23 MED ORDER — ONDANSETRON HCL 4 MG PO TABS: 4.0000 mg | ORAL_TABLET | Freq: Four times a day (QID) | ORAL | 0 refills | Status: DC

## 2017-02-23 MED ORDER — SODIUM CHLORIDE 0.9 % IV BOLUS (SEPSIS)
1000.0000 mL | Freq: Once | INTRAVENOUS | Status: AC
  Administered 2017-02-23: 1000 mL via INTRAVENOUS

## 2017-02-23 MED ORDER — PROMETHAZINE HCL 25 MG/ML IJ SOLN
25.0000 mg | Freq: Once | INTRAMUSCULAR | Status: AC
  Administered 2017-02-23: 25 mg via INTRAVENOUS
  Filled 2017-02-23: qty 1

## 2017-02-23 MED ORDER — MORPHINE SULFATE (PF) 4 MG/ML IV SOLN
4.0000 mg | Freq: Once | INTRAVENOUS | Status: AC
  Administered 2017-02-23: 4 mg via INTRAVENOUS
  Filled 2017-02-23: qty 1

## 2017-02-23 MED ORDER — IOPAMIDOL (ISOVUE-300) INJECTION 61%
100.0000 mL | Freq: Once | INTRAVENOUS | Status: AC | PRN
  Administered 2017-02-23: 100 mL via INTRAVENOUS

## 2017-02-23 MED ORDER — IOPAMIDOL (ISOVUE-300) INJECTION 61%
INTRAVENOUS | Status: AC
  Filled 2017-02-23: qty 100

## 2017-02-23 NOTE — Discharge Instructions (Addendum)
Please read attached information. If you experience any new or worsening signs or symptoms please return to the emergency room for evaluation. Please follow-up with your primary care provider or specialist as discussed. Please use medication prescribed only as directed and discontinue taking if you have any concerning signs or symptoms.   °

## 2017-02-23 NOTE — ED Provider Notes (Signed)
[facility] DEPT Provider Note   CSN: [id number] Arrival date & time: 02/23/17  1846     History   Chief Complaint Chief Complaint  Patient presents with  . Abdominal Pain    HPI Andrea Berry is a 51 y.o. female.  HPI   51 year old female presents today with complaints of abdominal pain diarrhea nausea.  Patient's family is at bedside who help with the history.  They note 3 days of left lower quadrant abdominal pain, diarrhea, and nausea and vomiting.  They note the symptoms have persisted with 2 episodes of vomiting followed by one episode yesterday.  Patient denies any history of the same.  Patient is blind and is unable to describe the color of diarrhea including any bloody diarrhea.  She said very little p.o. intake due to nausea and vomiting.  Patient given Zofran prior to arrival in the ED.  She denies any fevers at home.  Past Medical History:  Diagnosis Date  . Arthritis   . Blind   . Depression   . GERD (gastroesophageal reflux disease)   . Headache(784.0)   . Hypertension   . Kidney stones   . Lupus   . Shortness of breath    due to medication  . Stroke [facility]) 2008   visually impaired    Patient Active Problem List   Diagnosis Date Noted  . 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 High risk medication use 01/03/2016  . Ataxia 01/03/2016  . Dysesthesia 01/03/2016  . Depression with anxiety 01/03/2016  . Lupus cerebritis ([place]) 11/25/2015  . UTI (lower urinary tract infection) 11/25/2015  . Hyponatremia 11/25/2015  . Anemia of chronic disease 10/28/2015    Past Surgical History:  Procedure Laterality Date  . BREAST BIOPSY    . TUBAL LIGATION    . TUBAL LIGATION      OB History    Gravida Para Term Preterm AB Living   3 3 3     3    SAB TAB Ectopic Multiple Live Births           3       Home Medications    Prior to Admission medications   Medication Sig Start Date End Date Taking? Authorizing Provider  acetaminophen (TYLENOL) 325  MG tablet Take 650 mg by mouth every 4 (four) hours as needed for fever.   Yes [provider]  alendronate (FOSAMAX) 70 MG tablet Take 70 mg by mouth once a week. Take with a full glass of water on an empty stomach.   Yes [provider]  azaTHIOprine (IMURAN) 50 MG tablet Take 50 mg by mouth daily.  02/10/13  Yes [provider]  carisoprodol (SOMA) 350 MG tablet Take 350 mg by mouth 4 (four) times daily as needed for muscle spasms.   Yes [provider]  diazepam (VALIUM) 5 MG tablet Take 5 mg by mouth every 8 (eight) hours as needed for anxiety.   Yes [provider]  gabapentin (NEURONTIN) 300 MG capsule TAKE ONE CAPSULE BY MOUTH 3 TIMES A DAY 02/24/16  Yes Jaffe, Adam R, DO  HYDROmorphone (DILAUDID) 2 MG tablet Take 2 tablets (4 mg total) by mouth every 4 (four) hours as needed for severe pain. 12/02/15  Yes Rai, Ripudeep K, MD  lamoTRIgine (LAMICTAL) 100 MG tablet Take 1 tablet (100 mg total) by mouth daily. 01/03/16  Yes Sater, Nanine Means, MD  omeprazole (PRILOSEC) 20 MG capsule Take 20 mg by mouth daily.  01/18/13  Yes [provider]  predniSONE (DELTASONE) 5 MG tablet Take 5 mg by mouth daily with breakfast.   Yes [provider]  RiTUXimab (RITUXAN IV) Inject 1,000 mg into the vein.   Yes [provider]  Rivaroxaban (XARELTO STARTER PACK) 15 & 20 MG TBPK Take 20mg  tablet daily with supper. 12/02/15  Yes Rai, Ripudeep K, MD  tiZANidine (ZANAFLEX) 4 MG tablet Take 1 tablet (4 mg total) by mouth every 6 (six) hours as needed for muscle spasms. 01/03/16  Yes Sater, Nanine Means, MD  bisacodyl (DULCOLAX) 10 MG suppository Place 1 suppository (10 mg total) rectally daily as needed for moderate constipation. Patient not taking: Reported on 02/23/2017 12/02/15   Rai, Vernelle Emerald, MD  cyclobenzaprine (FLEXERIL) 5 MG tablet Take 1 tablet (5 mg total) by mouth daily as needed for muscle spasms. Patient not taking: Reported on 02/23/2017  12/02/15   Mendel Corning, MD  lamoTRIgine (LAMICTAL) 25 MG tablet 1 po qd x 1 wk, then 1 po bid x 1 wk, then 1 po tid x 1 wk, then 2 po bid x 1 wk Patient not taking: Reported on 02/23/2017 01/03/16   Sater, Nanine Means, MD  ondansetron (ZOFRAN) 4 MG tablet Take 1 tablet (4 mg total) by mouth every 6 (six) hours. 02/23/17   Mysti Haley, Dellis Filbert, PA-C  senna (SENOKOT) 8.6 MG TABS tablet Take 2 tablets (17.2 mg total) by mouth at bedtime. Patient not taking: Reported on 02/23/2017 12/02/15   Rai, Vernelle Emerald, MD  sertraline (ZOLOFT) 50 MG tablet Take 2 tablets (100 mg total) by mouth daily. Patient not taking: Reported on 02/23/2017 12/02/15   Mendel Corning, MD    Family History Family History  Problem Relation Age of Onset  . Cancer Mother   . Cancer Father   . Lupus Sister     Social History Social History  Substance Use Topics  . Smoking status: Never Smoker  . Smokeless tobacco: Never Used  . Alcohol use No     Allergies   Patient has no known allergies.   Review of Systems Review of Systems  All other systems reviewed and are negative.   Physical Exam Updated Vital Signs BP (!) 132/96 (BP Location: Left Arm)   Pulse (!) 102   Temp 98.5 F (36.9 C) (Oral)   Resp 17   SpO2 96%   Physical Exam  Constitutional: She is oriented to person, place, and time. She appears well-developed and well-nourished.  HENT:  Head: Normocephalic and atraumatic.  Eyes: Conjunctivae are normal. Pupils are equal, round, and reactive to light. Right eye exhibits no discharge. Left eye exhibits no discharge. No scleral icterus.  Neck: Normal range of motion. No JVD present. No tracheal deviation present.  Pulmonary/Chest: Effort normal. No stridor.  Abdominal:  TTP of LLQ  Neurological: She is alert and oriented to person, place, and time. Coordination normal.  Psychiatric: She has a normal mood and affect. Her behavior is normal. Judgment and thought content normal.  Nursing note and vitals  reviewed.    ED Treatments / Results  Labs (all labs ordered are listed, but only abnormal results are displayed) Labs Reviewed  COMPREHENSIVE METABOLIC PANEL - Abnormal; Notable for the following:       Result Value   AST 46 (*)    All other components within normal limits  CBC - Abnormal; Notable for the following:    WBC 3.5 (*)    RBC 5.62 (*)    MCV  71.2 (*)    MCH 22.6 (*)    RDW 15.7 (*)    All other components within normal limits  URINALYSIS, ROUTINE W REFLEX MICROSCOPIC - Abnormal; Notable for the following:    Specific Gravity, Urine >1.030 (*)    Bilirubin Urine MODERATE (*)    Ketones, ur 15 (*)    All other components within normal limits  LIPASE, BLOOD    EKG  EKG Interpretation None       Radiology Ct Abdomen Pelvis W Contrast  Result Date: 02/23/2017 CLINICAL DATA:  Left lower quadrant pain for 3 days. Nausea and vomiting. EXAM: CT ABDOMEN AND PELVIS WITH CONTRAST TECHNIQUE: Multidetector CT imaging of the abdomen and pelvis was performed using the standard protocol following bolus administration of intravenous contrast. CONTRAST:  135mL ISOVUE-300 IOPAMIDOL (ISOVUE-300) INJECTION 61% COMPARISON:  None. FINDINGS: Lower chest: Streaky opacity in the right base is consistent with atelectasis. No suspicious infiltrate. No nodule or mass. Small hiatal hernia. No other abnormalities are identified. Hepatobiliary: No focal liver abnormality is seen. No gallstones, gallbladder wall thickening, or biliary dilatation. Pancreas: Unremarkable. No pancreatic ductal dilatation or surrounding inflammatory changes. Spleen: Normal in size without focal abnormality. Adrenals/Urinary Tract: Multiple stones are seen in both kidneys with no hydronephrosis or perinephric stranding. No suspicious masses. No ureterectasis or ureteral stones. The bladder is poorly distended but unremarkable. Delayed images demonstrate no filling defects in the upper renal collecting systems.  Stomach/Bowel: The stomach and small bowel are normal. The colon demonstrates scattered colonic diverticuli without diverticulitis. The visualized appendix is normal with no appendicitis. Vascular/Lymphatic: No significant vascular findings are present. No enlarged abdominal or pelvic lymph nodes. Reproductive: Uterus and bilateral adnexa are unremarkable. Other: No abdominal wall hernia or abnormality. No abdominopelvic ascites. Musculoskeletal: No acute or significant osseous findings. IMPRESSION: 1. Nonobstructive stones seen in both kidneys. 2. Diverticulosis without diverticulitis. 3. No other significant abnormalities. Electronically Signed   By: Dorise Bullion III M.D   On: 02/23/2017 23:07    Procedures Procedures (including critical care time)  Medications Ordered in ED Medications  sodium chloride 0.9 % bolus 1,000 mL (0 mLs Intravenous Stopped 02/24/17 0001)  morphine 4 MG/ML injection 4 mg (4 mg Intravenous Given 02/23/17 2037)  iopamidol (ISOVUE-300) 61 % injection 100 mL (100 mLs Intravenous Contrast Given 02/23/17 2245)  promethazine (PHENERGAN) injection 25 mg (25 mg Intravenous Given 02/23/17 2352)     Initial Impression / Assessment and Plan / ED Course  I have reviewed the triage vital signs and the nursing notes.  Pertinent labs & imaging results that were available during my care of the patient were reviewed by me and considered in my medical decision making (see chart for details).     Final Clinical Impressions(s) / ED Diagnoses   Final diagnoses:  Left lower quadrant pain  Gastroenteritis    Labs: Urinalysis, lipase, CMP  Imaging: CT abdomen and pelvis  Consults:  Therapeutics: Morphine, normal saline  Discharge Meds:   Assessment/Plan: 51 year old female presents today with complaints of left lower quadrant abdominal pain nausea vomiting diarrhea.  Patient's presentation is consistent with a viral gastroenteritis.  She is well-appearing in no acute  distress.  Tolerating p.o. here.  Patient has reassuring workup, antinausea medication given, follow-up information given.  She verbalized understanding and agreement to this plan had no further questions or concerns       New Prescriptions Discharge Medication List as of 02/23/2017 11:29 PM    START taking these medications  Details  ondansetron (ZOFRAN) 4 MG tablet Take 1 tablet (4 mg total) by mouth every 6 (six) hours., Starting Sat 02/23/2017, Print         Dianne Whelchel, Dellis Filbert, PA-C 02/24/17 2333    Forde Dandy, MD 02/25/17 [phone]

## 2017-02-23 NOTE — ED Triage Notes (Addendum)
Per EMS report:  Pt has been having RLQ pain x 3 days, w/ n/v today and diarrhea previous to today.  EMS VS: 144/77, 88 SR,  CBG 94, 100%RA.  EMS gave zofran 4mg  ODT for nausea.

## 2017-02-24 NOTE — ED Notes (Signed)
Pt was given regular [employer] for po challenger, per pt's preference.

## 2017-04-22 ENCOUNTER — Emergency Department (HOSPITAL_COMMUNITY): Payer: Medicare Other

## 2017-04-22 ENCOUNTER — Emergency Department (HOSPITAL_COMMUNITY)
Admission: EM | Admit: 2017-04-22 | Discharge: 2017-04-23 | Disposition: A | Discharge status: Home / Self Care | Payer: Medicare Other | Attending: Emergency Medicine | Admitting: Emergency Medicine

## 2017-04-22 ENCOUNTER — Encounter (HOSPITAL_COMMUNITY): Payer: Self-pay | Admitting: Radiology

## 2017-04-22 DIAGNOSIS — I10 Essential (primary) hypertension: Secondary | ICD-10-CM | POA: Insufficient documentation

## 2017-04-22 DIAGNOSIS — Z8673 Personal history of transient ischemic attack (TIA), and cerebral infarction without residual deficits: Secondary | ICD-10-CM | POA: Diagnosis not present

## 2017-04-22 DIAGNOSIS — R1084 Generalized abdominal pain: Secondary | ICD-10-CM | POA: Diagnosis not present

## 2017-04-22 DIAGNOSIS — Z79899 Other long term (current) drug therapy: Secondary | ICD-10-CM | POA: Insufficient documentation

## 2017-04-22 DIAGNOSIS — Z7901 Long term (current) use of anticoagulants: Secondary | ICD-10-CM | POA: Diagnosis not present

## 2017-04-22 DIAGNOSIS — R112 Nausea with vomiting, unspecified: Secondary | ICD-10-CM

## 2017-04-22 DIAGNOSIS — N200 Calculus of kidney: Secondary | ICD-10-CM | POA: Diagnosis not present

## 2017-04-22 LAB — COMPREHENSIVE METABOLIC PANEL
ALK PHOS: 88 U/L (ref 38–126)
ALT: 45 U/L (ref 14–54)
ANION GAP: 10 (ref 5–15)
AST: 68 U/L — ABNORMAL HIGH (ref 15–41)
Albumin: 4.3 g/dL (ref 3.5–5.0)
BUN: 11 mg/dL (ref 6–20)
CALCIUM: 9.4 mg/dL (ref 8.9–10.3)
CO2: 23 mmol/L (ref 22–32)
Chloride: 106 mmol/L (ref 101–111)
Creatinine, Ser: 0.85 mg/dL (ref 0.44–1.00)
GFR calc non Af Amer: >60 mL/min (ref >60)
Glucose, Bld: 94 mg/dL (ref 65–99)
Potassium: 3.7 mmol/L (ref 3.5–5.1)
SODIUM: 139 mmol/L (ref 135–145)
TOTAL PROTEIN: 7.5 g/dL (ref 6.5–8.1)
Total Bilirubin: 0.8 mg/dL (ref 0.3–1.2)

## 2017-04-22 LAB — CBC
HCT: 38.1 % (ref 36.0–46.0)
HEMOGLOBIN: 12.3 g/dL (ref 12.0–15.0)
MCH: 22.8 pg — AB (ref 26.0–34.0)
MCHC: 32.3 g/dL (ref 30.0–36.0)
MCV: 70.6 fL — ABNORMAL LOW (ref 78.0–100.0)
Platelets: 234 10*3/uL (ref 150–400)
RBC: 5.4 MIL/uL — ABNORMAL HIGH (ref 3.87–5.11)
RDW: 14.6 % (ref 11.5–15.5)
WBC: 5.4 10*3/uL (ref 4.0–10.5)

## 2017-04-22 LAB — I-STAT BETA HCG BLOOD, ED (MC, WL, AP ONLY)

## 2017-04-22 LAB — LIPASE, BLOOD: LIPASE: 27 U/L (ref 11–51)

## 2017-04-22 MED ORDER — SODIUM CHLORIDE 0.9 % IV BOLUS (SEPSIS)
500.0000 mL | Freq: Once | INTRAVENOUS | Status: AC
  Administered 2017-04-22: 500 mL via INTRAVENOUS

## 2017-04-22 MED ORDER — IOPAMIDOL (ISOVUE-300) INJECTION 61%
100.0000 mL | Freq: Once | INTRAVENOUS | Status: AC | PRN
  Administered 2017-04-22: 100 mL via INTRAVENOUS

## 2017-04-22 MED ORDER — SODIUM CHLORIDE 0.9 % IV BOLUS (SEPSIS)
1000.0000 mL | Freq: Once | INTRAVENOUS | Status: AC
  Administered 2017-04-22: 1000 mL via INTRAVENOUS

## 2017-04-22 MED ORDER — IOPAMIDOL (ISOVUE-300) INJECTION 61%
INTRAVENOUS | Status: AC
  Filled 2017-04-22: qty 100

## 2017-04-22 MED ORDER — ONDANSETRON HCL 4 MG/2ML IJ SOLN
4.0000 mg | Freq: Once | INTRAMUSCULAR | Status: AC
  Administered 2017-04-22: 4 mg via INTRAVENOUS
  Filled 2017-04-22: qty 2

## 2017-04-22 MED ORDER — PROMETHAZINE HCL 25 MG/ML IJ SOLN
12.5000 mg | Freq: Once | INTRAMUSCULAR | Status: AC
  Administered 2017-04-22: 12.5 mg via INTRAVENOUS
  Filled 2017-04-22: qty 1

## 2017-04-22 MED ORDER — SODIUM CHLORIDE 0.9 % IJ SOLN
INTRAMUSCULAR | Status: AC
  Filled 2017-04-22: qty 50

## 2017-04-22 MED ORDER — HYDROMORPHONE HCL 1 MG/ML IJ SOLN
0.5000 mg | Freq: Once | INTRAMUSCULAR | Status: AC
  Administered 2017-04-22: 0.5 mg via INTRAVENOUS
  Filled 2017-04-22: qty 1

## 2017-04-22 NOTE — ED Notes (Signed)
Unable to collect labs.  I did not see or feel any place to collect from.

## 2017-04-22 NOTE — ED Triage Notes (Signed)
Pt states that she has had N/V and some abdominal discomfort x 3 days. Alert and oriented. Legally blind with previous L sided deficits.

## 2017-04-22 NOTE — ED Notes (Signed)
Patient transported to CT 

## 2017-04-22 NOTE — ED Provider Notes (Signed)
[facility] DEPT Provider Note   CSN: [id number] Arrival date & time: 04/22/17  1904     History   Chief Complaint Chief Complaint  Patient presents with  . Emesis    HPI Andrea Berry is a 51 y.o. female.  HPI Andrea Berry is a 50 y.o. female with hx of blindness, htn, lupus, CVA, presents to ED with complaint Of nausea vomiting abdominal pain. Patient states her symptoms started 3 days ago.She reports lower abdominal pain, she reports vomiting 3 days ago which is now improving. She states now "it's just nausea and gagging." She denies any back pain. Denies any urinary symptoms. No vaginal discharge or bleeding. Denies any blood in her stool or emesis. She reports chronic constipation due to taking pain medications daily. She has not tried any medications at home to help with this pain or nausea.  Past Medical History:  Diagnosis Date  . Arthritis   . Blind   . Depression   . GERD (gastroesophageal reflux disease)   . Headache(784.0)   . Hypertension   . Kidney stones   . Lupus   . Shortness of breath    due to medication  . Stroke [facility]) 2008   visually impaired    Patient Active Problem List   Diagnosis Date Noted  . 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 High risk medication use 01/03/2016  . Ataxia 01/03/2016  . Dysesthesia 01/03/2016  . Depression with anxiety 01/03/2016  . Lupus cerebritis ([place]) 11/25/2015  . UTI (lower urinary tract infection) 11/25/2015  . Hyponatremia 11/25/2015  . Anemia of chronic disease 10/28/2015    Past Surgical History:  Procedure Laterality Date  . BREAST BIOPSY    . TUBAL LIGATION    . TUBAL LIGATION      OB History    Gravida Para Term Preterm AB Living   3 3 3     3    SAB TAB Ectopic Multiple Live Births           3       Home Medications    Prior to Admission medications   Medication Sig Start Date End Date Taking? Authorizing Provider  acetaminophen (TYLENOL) 325 MG tablet Take 650 mg by mouth  every 4 (four) hours as needed for fever.    [provider]  alendronate (FOSAMAX) 70 MG tablet Take 70 mg by mouth once a week. Take with a full glass of water on an empty stomach.    [provider]  azaTHIOprine (IMURAN) 50 MG tablet Take 50 mg by mouth daily.  02/10/13   [provider]  bisacodyl (DULCOLAX) 10 MG suppository Place 1 suppository (10 mg total) rectally daily as needed for moderate constipation. Patient not taking: Reported on 02/23/2017 12/02/15   Mendel Corning, MD  carisoprodol (SOMA) 350 MG tablet Take 350 mg by mouth 4 (four) times daily as needed for muscle spasms.    [provider]  cyclobenzaprine (FLEXERIL) 5 MG tablet Take 1 tablet (5 mg total) by mouth daily as needed for muscle spasms. Patient not taking: Reported on 02/23/2017 12/02/15   Rai, Vernelle Emerald, MD  diazepam (VALIUM) 5 MG tablet Take 5 mg by mouth every 8 (eight) hours as needed for anxiety.    [provider]  gabapentin (NEURONTIN) 300 MG capsule TAKE ONE CAPSULE BY MOUTH 3 TIMES A DAY 02/24/16   Jaffe, Adam R, DO  HYDROmorphone (DILAUDID) 2 MG tablet Take 2 tablets (4 mg total)  by mouth every 4 (four) hours as needed for severe pain. 12/02/15   Rai, Vernelle Emerald, MD  lamoTRIgine (LAMICTAL) 100 MG tablet Take 1 tablet (100 mg total) by mouth daily. 01/03/16   Sater, Nanine Means, MD  lamoTRIgine (LAMICTAL) 25 MG tablet 1 po qd x 1 wk, then 1 po bid x 1 wk, then 1 po tid x 1 wk, then 2 po bid x 1 wk Patient not taking: Reported on 02/23/2017 01/03/16   Sater, Nanine Means, MD  omeprazole (PRILOSEC) 20 MG capsule Take 20 mg by mouth daily.  01/18/13   [provider]  ondansetron (ZOFRAN) 4 MG tablet Take 1 tablet (4 mg total) by mouth every 6 (six) hours. 02/23/17   Hedges, Dellis Filbert, PA-C  predniSONE (DELTASONE) 5 MG tablet Take 5 mg by mouth daily with breakfast.    [provider]  RiTUXimab (RITUXAN IV) Inject 1,000 mg into the vein.    [provider]    Rivaroxaban (XARELTO STARTER PACK) 15 & 20 MG TBPK Take 20mg  tablet daily with supper. 12/02/15   Rai, Vernelle Emerald, MD  senna (SENOKOT) 8.6 MG TABS tablet Take 2 tablets (17.2 mg total) by mouth at bedtime. Patient not taking: Reported on 02/23/2017 12/02/15   Rai, Vernelle Emerald, MD  sertraline (ZOLOFT) 50 MG tablet Take 2 tablets (100 mg total) by mouth daily. Patient not taking: Reported on 02/23/2017 12/02/15   Rai, Vernelle Emerald, MD  tiZANidine (ZANAFLEX) 4 MG tablet Take 1 tablet (4 mg total) by mouth every 6 (six) hours as needed for muscle spasms. 01/03/16   Sater, Nanine Means, MD    Family History Family History  Problem Relation Age of Onset  . Cancer Mother   . Cancer Father   . Lupus Sister     Social History Social History  Substance Use Topics  . Smoking status: Never Smoker  . Smokeless tobacco: Never Used  . Alcohol use No     Allergies   Patient has no known allergies.   Review of Systems Review of Systems  Constitutional: Negative for chills and fever.  Respiratory: Negative for cough, chest tightness and shortness of breath.   Cardiovascular: Negative for chest pain, palpitations and leg swelling.  Gastrointestinal: Positive for abdominal pain, nausea and vomiting. Negative for diarrhea.  Genitourinary: Negative for dysuria, flank pain, pelvic pain, vaginal bleeding, vaginal discharge and vaginal pain.  Musculoskeletal: Negative for arthralgias, myalgias, neck pain and neck stiffness.  Skin: Negative for rash.  Neurological: Negative for dizziness, weakness and headaches.  All other systems reviewed and are negative.    Physical Exam Updated Vital Signs BP 128/73 (BP Location: Right Arm)   Pulse 92   Temp 99.7 F (37.6 C) (Oral)   Resp 16   SpO2 97%   Physical Exam  Constitutional: She is oriented to person, place, and time. She appears well-developed and well-nourished. No distress.  HENT:  Head: Normocephalic.  Eyes: Conjunctivae are normal.  Neck: Neck  supple.  Cardiovascular: Normal rate, regular rhythm and normal heart sounds.   Pulmonary/Chest: Effort normal and breath sounds normal. No respiratory distress. She has no wheezes. She has no rales.  Abdominal: Soft. Bowel sounds are normal. She exhibits no distension. There is tenderness. There is no rebound.  Diffuse tenderness. No guarding  Musculoskeletal: She exhibits no edema.  Neurological: She is alert and oriented to person, place, and time.  Skin: Skin is warm and dry.  Psychiatric: She has a normal mood and affect.  Her behavior is normal.  Nursing note and vitals reviewed.    ED Treatments / Results  Labs (all labs ordered are listed, but only abnormal results are displayed) Labs Reviewed  COMPREHENSIVE METABOLIC PANEL - Abnormal; Notable for the following:       Result Value   AST 68 (*)    All other components within normal limits  CBC - Abnormal; Notable for the following:    RBC 5.40 (*)    MCV 70.6 (*)    MCH 22.8 (*)    All other components within normal limits  URINALYSIS, ROUTINE W REFLEX MICROSCOPIC - Abnormal; Notable for the following:    Specific Gravity, Urine >1.046 (*)    Hgb urine dipstick LARGE (*)    Ketones, ur 5 (*)    Squamous Epithelial / LPF 0-5 (*)    All other components within normal limits  LIPASE, BLOOD  I-STAT BETA HCG BLOOD, ED ([facility], [facility], [facility] ONLY)    EKG  EKG Interpretation None       Radiology Ct Abdomen Pelvis W Contrast  Result Date: 04/22/2017 CLINICAL DATA:  Nausea and vomiting. EXAM: CT ABDOMEN AND PELVIS WITH CONTRAST TECHNIQUE: Multidetector CT imaging of the abdomen and pelvis was performed using the standard protocol following bolus administration of intravenous contrast. CONTRAST:  135mL ISOVUE-300 IOPAMIDOL (ISOVUE-300) INJECTION 61% COMPARISON:  Most recent CT 02/23/2017 FINDINGS: Lower chest: Scarring atelectasis in both lung bases. There is mild bronchiectasis in the right lower lobe. No pleural fluid. Hepatobiliary:  No focal hepatic lesion. Gallbladder physiologically distended. No calcified gallstone. Prominence of the proximal pancreatic duct with normal tapering distally, unchanged from prior exam. Pancreas: No ductal dilatation or inflammation. Spleen: Normal in size without focal abnormality. Adrenals/Urinary Tract: No adrenal nodule. There is a 4 mm stone in the right proximal ureter at the level of L2-L3, no hydronephrosis. No perinephric edema. Scattered nonobstructing stones in both kidneys. Homogeneous enhancement with symmetric renal excretion. Urinary bladder is physiologically distended, no bladder wall thickening. Stomach/Bowel: Stomach is decompressed and not well evaluated. No bowel obstruction, wall thickening or inflammation. Normal appendix. Small colonic stool burden. Vascular/Lymphatic: No significant vascular findings are present. No enlarged abdominal or pelvic lymph nodes. Reproductive: Uterus and bilateral adnexa are unremarkable. Other: No free air, free fluid, or intra-abdominal fluid collection. Musculoskeletal: There are no acute or suspicious osseous abnormalities. IMPRESSION: 1. There is a 4 mm stone in the right proximal ureter without hydronephrosis or back pressure changes. Nonobstructing intrarenal calculi in both kidneys. 2. Lung base scarring and atelectasis, right greater than left. Electronically Signed   By: Jeb Levering M.D.   On: 04/22/2017 23:15    Procedures Procedures (including critical care time)  Medications Ordered in ED Medications  sodium chloride 0.9 % bolus 1,000 mL (not administered)  HYDROmorphone (DILAUDID) injection 0.5 mg (not administered)  ondansetron (ZOFRAN) injection 4 mg (not administered)     Initial Impression / Assessment and Plan / ED Course  I have reviewed the triage vital signs and the nursing notes.  Pertinent labs & imaging results that were available during my care of the patient were reviewed by me and considered in my medical  decision making (see chart for details).     Patient with abdominal pain, nausea, vomiting for 3 days. Basically very uncomfortable. Vital signs are normal, temp is 99.6. No urinary symptoms. Will get labs, CT abdomen and pelvis, antiemetics and pain medications ordered.  10:48 PM PT feeling better after zofran and dilaudid. States  still nauseated. Will add phenergan. CT pending. Labs with no significant findings.    12:55 AM CT negative other than chronic stone, non obstructing. Do not think it is the cause of her pain. Pt is not vomiting at this time. Tolerating POs. Will dc home with nausea medications.  Follow up with pcp. Return precuations discussed.   Vitals:   04/22/17 2134 04/22/17 2144 04/22/17 2301 04/23/17 0024  BP:  136/76 138/76 126/74  Pulse:  94 91 (!) 102  Resp:  19 18 19   Temp:  99.6 F (37.6 C)    TempSrc:  Oral    SpO2:  99% 98% 97%  Weight: 79.8 kg (176 lb)     Height: 5\' 1"  (1.549 m)        Final Clinical Impressions(s) / ED Diagnoses   Final diagnoses:  Generalized abdominal pain  Non-intractable vomiting with nausea, unspecified vomiting type  Kidney stone    New Prescriptions New Prescriptions   PROMETHAZINE (PHENERGAN) 25 MG SUPPOSITORY    Place 1 suppository (25 mg total) rectally every 6 (six) hours as needed for nausea or vomiting.   PROMETHAZINE (PHENERGAN) 25 MG TABLET    Take 1 tablet (25 mg total) by mouth every 6 (six) hours as needed for nausea or vomiting.     Jeannett Senior, PA-C 56/15/37 [id number]    Delora Fuel, MD 76/14/70 [phone]

## 2017-04-23 LAB — URINALYSIS, ROUTINE W REFLEX MICROSCOPIC
Bacteria, UA: NONE SEEN
Bilirubin Urine: NEGATIVE
GLUCOSE, UA: NEGATIVE mg/dL
Ketones, ur: 5 mg/dL — AB
Leukocytes, UA: NEGATIVE
NITRITE: NEGATIVE
PH: 5 (ref 5.0–8.0)
PROTEIN: NEGATIVE mg/dL

## 2017-04-23 MED ORDER — PROMETHAZINE HCL 25 MG RE SUPP: 25.0000 mg | Freq: Four times a day (QID) | RECTAL | 0 refills | Status: DC | PRN

## 2017-04-23 MED ORDER — PROMETHAZINE HCL 25 MG PO TABS: 25.0000 mg | ORAL_TABLET | Freq: Four times a day (QID) | ORAL | 0 refills | Status: DC | PRN

## 2017-04-23 NOTE — Discharge Instructions (Signed)
Take phenergan as prescribed as needed for nausea and vomiting. Follow up with family doctor as needed. Return if worsening symptoms.

## 2017-04-30 ENCOUNTER — Other Ambulatory Visit: Payer: Self-pay | Admitting: Certified Nurse Midwife

## 2017-04-30 ENCOUNTER — Other Ambulatory Visit (HOSPITAL_COMMUNITY)
Admission: RE | Admit: 2017-04-30 | Discharge: 2017-04-30 | Disposition: A | Discharge status: Home / Self Care | Payer: Medicare Other | Source: Ambulatory Visit | Attending: Certified Nurse Midwife | Admitting: Certified Nurse Midwife

## 2017-04-30 ENCOUNTER — Ambulatory Visit (INDEPENDENT_AMBULATORY_CARE_PROVIDER_SITE_OTHER): Payer: Medicare Other | Admitting: Certified Nurse Midwife

## 2017-04-30 ENCOUNTER — Encounter: Payer: Self-pay | Admitting: Obstetrics

## 2017-04-30 VITALS — BP 125/82 | HR 81 | Wt 175.0 lb

## 2017-04-30 DIAGNOSIS — Z01419 Encounter for gynecological examination (general) (routine) without abnormal findings: Secondary | ICD-10-CM | POA: Diagnosis not present

## 2017-04-30 DIAGNOSIS — Z1231 Encounter for screening mammogram for malignant neoplasm of breast: Secondary | ICD-10-CM

## 2017-04-30 DIAGNOSIS — N898 Other specified noninflammatory disorders of vagina: Secondary | ICD-10-CM | POA: Insufficient documentation

## 2017-04-30 NOTE — Progress Notes (Signed)
Subjective:        Andrea Berry is a 51 y.o. female here for a routine exam.  Current complaints: change in vaginal discharge.  Is currently sexually active.  Had a stroke a few years ago with left sided weakness, is not currently in physical therapy.  Ambulates with a walker.  Reports vaginal discharge with odor, denies any itching.  Is not using any condoms with sexual intercourse.  States "she gets it when she can".  Declines blood STD testing.  Has a hx of BTL.       Personal health questionnaire:  Is patient Andrea Berry, have a family history of breast and/or ovarian cancer: unknown cancer type mother and father deceased.   Is there a family history of uterine cancer diagnosed at age < 71, gastrointestinal cancer, urinary tract cancer, family member who is a Field seismologist syndrome-associated carrier: no Is the patient overweight and hypertensive, family history of diabetes, personal history of gestational diabetes, preeclampsia or PCOS: yes Is patient over 56, have PCOS,  family history of premature CHD under age 2, diabetes, smoke, have hypertension or peripheral artery disease:  yes At any time, has a partner hit, kicked or otherwise hurt or frightened you?: no Over the past 2 weeks, have you felt down, depressed or hopeless?: no Over the past 2 weeks, have you felt little interest or pleasure in doing things?:no   Gynecologic History No LMP recorded. Patient is postmenopausal. Contraception: post menopausal status and tubal ligation Last Pap: 01/29/14. Results were: normal Last mammogram: 11/19/14. Results were: normal  Obstetric History OB History  Gravida Para Term Preterm AB Living  3 3 3     3   SAB TAB Ectopic Multiple Live Births          3    # Outcome Date GA Lbr Len/2nd Weight Sex Delivery Anes PTL Lv  3 Term 06/30/93 [redacted]w[redacted]d  6 lb (2.722 kg) M Vag-Spont None  LIV  2 Term 04/22/90 [redacted]w[redacted]d  6 lb 7 oz (2.92 kg) M Vag-Spont None  LIV  1 Term 08/13/88 [redacted]w[redacted]d  7 lb (3.175 kg)  M Vag-Spont None  LIV      Past Medical History:  Diagnosis Date  . Arthritis   . Blind   . Depression   . GERD (gastroesophageal reflux disease)   . Headache(784.0)   . Hypertension   . Kidney stones   . Lupus   . Shortness of breath    due to medication  . Stroke [facility]) 2008   visually impaired    Past Surgical History:  Procedure Laterality Date  . BREAST BIOPSY    . TUBAL LIGATION    . TUBAL LIGATION       Current Outpatient Prescriptions:  .  alendronate (FOSAMAX) 70 MG tablet, Take 70 mg by mouth once a week. Take with a full glass of water on an empty stomach., Disp: , Rfl:  .  azaTHIOprine (IMURAN) 50 MG tablet, Take 50 mg by mouth 2 (two) times daily. , Disp: , Rfl:  .  LORazepam (ATIVAN) 0.5 MG tablet, Take 0.5 mg by mouth daily., Disp: , Rfl:  .  naproxen (NAPROSYN) 250 MG tablet, Take 250 mg by mouth daily., Disp: , Rfl:  .  omeprazole (PRILOSEC) 20 MG capsule, Take 20 mg by mouth daily. , Disp: , Rfl:  .  predniSONE (DELTASONE) 5 MG tablet, Take 10 mg by mouth daily with breakfast. , Disp: , Rfl:  .  pregabalin (LYRICA)  200 MG capsule, Take 200 mg by mouth 3 (three) times daily., Disp: , Rfl:  .  promethazine (PHENERGAN) 25 MG suppository, Place 1 suppository (25 mg total) rectally every 6 (six) hours as needed for nausea or vomiting., Disp: 12 each, Rfl: 0 .  promethazine (PHENERGAN) 25 MG tablet, Take 1 tablet (25 mg total) by mouth every 6 (six) hours as needed for nausea or vomiting., Disp: 15 tablet, Rfl: 0 .  RiTUXimab (RITUXAN IV), Inject 1,000 mg into the vein., Disp: , Rfl:  .  Rivaroxaban (XARELTO STARTER PACK) 15 & 20 MG TBPK, Take 20mg  tablet daily with supper., Disp: 30 each, Rfl: 0 .  rivaroxaban (XARELTO) 20 MG TABS tablet, Take 20 mg by mouth daily., Disp: , Rfl:  .  sertraline (ZOLOFT) 50 MG tablet, Take 2 tablets (100 mg total) by mouth daily., Disp: 30 tablet, Rfl: 0 .  tiZANidine (ZANAFLEX) 4 MG tablet, Take 1 tablet (4 mg total) by mouth every  6 (six) hours as needed for muscle spasms., Disp: 30 tablet, Rfl: 0 .  gabapentin (NEURONTIN) 300 MG capsule, TAKE ONE CAPSULE BY MOUTH 3 TIMES A DAY (Patient not taking: Reported on 04/22/2017), Disp: 90 capsule, Rfl: 3 .  HYDROmorphone (DILAUDID) 2 MG tablet, Take 2 tablets (4 mg total) by mouth every 4 (four) hours as needed for severe pain. (Patient not taking: Reported on 04/22/2017), Disp: 30 tablet, Rfl: 0 .  lamoTRIgine (LAMICTAL) 100 MG tablet, Take 1 tablet (100 mg total) by mouth daily. (Patient not taking: Reported on 04/22/2017), Disp: 60 tablet, Rfl: 11 .  lamoTRIgine (LAMICTAL) 25 MG tablet, 1 po qd x 1 wk, then 1 po bid x 1 wk, then 1 po tid x 1 wk, then 2 po bid x 1 wk (Patient not taking: Reported on 02/23/2017), Disp: 70 tablet, Rfl: 0 .  ondansetron (ZOFRAN) 4 MG tablet, Take 1 tablet (4 mg total) by mouth every 6 (six) hours. (Patient not taking: Reported on 04/22/2017), Disp: 12 tablet, Rfl: 0 No Known Allergies  Social History  Substance Use Topics  . Smoking status: Never Smoker  . Smokeless tobacco: Never Used  . Alcohol use No    Family History  Problem Relation Age of Onset  . Cancer Mother   . Cancer Father   . Lupus Sister       Review of Systems  Constitutional: negative for fatigue and weight loss Respiratory: negative for cough and wheezing Cardiovascular: negative for chest pain, fatigue and palpitations Gastrointestinal: negative for abdominal pain and change in bowel habits Musculoskeletal:negative for myalgias Neurological: negative for gait problems and tremors Behavioral/Psych: negative for abusive relationship, depression Endocrine: negative for temperature intolerance    Genitourinary:negative for abnormal menstrual periods, genital lesions, hot flashes, sexual problems and vaginal discharge Integument/breast: negative for breast lump, breast tenderness, nipple discharge and skin lesion(s)    Objective:       BP 125/82   Pulse 81   Wt 175 lb  (79.4 kg)   BMI 33.07 kg/m  General:   alert  Skin:   no rash or abnormalities  Lungs:   clear to auscultation bilaterally  Heart:   regular rate and rhythm, S1, S2 normal, no murmur, click, rub or gallop  Breasts:   normal without suspicious masses, skin or nipple changes or axillary nodes  Abdomen:  normal findings: no organomegaly, soft, non-tender and no hernia  Pelvis:  External genitalia: normal general appearance Urinary system: urethral meatus normal and bladder without fullness, nontender  Vaginal: normal without tenderness, induration or masses Cervix: normal appearance Adnexa: normal bimanual exam Uterus: anteverted and non-tender, normal size   Lab Review Urine pregnancy test Labs reviewed yes Radiologic studies reviewed yes  50% of 30 min visit spent on counseling and coordination of care.    Assessment:    Healthy female exam.  Well woman exam with routine gynecological exam - Plan: Cytology - PAP, CANCELED: Cervicovaginal ancillary only, CANCELED: MM DIGITAL SCREENING BILATERAL, CANCELED: MM SCREENING BREAST TOMO BILATERAL  Vaginal discharge - Plan: Cervicovaginal ancillary only    Plan:    Education reviewed: calcium supplements, depression evaluation, low fat, low cholesterol diet, safe sex/STD prevention, self breast exams, skin cancer screening and weight bearing exercise. Mammogram ordered. Follow up in: 2 years.   Mammogram scheduled for 05/21/17.

## 2017-04-30 NOTE — Progress Notes (Signed)
Patient is in the office today for annual exam. Patient reports she is having an odor from the vagina for about a month.

## 2017-05-01 LAB — CERVICOVAGINAL ANCILLARY ONLY
BACTERIAL VAGINITIS: NEGATIVE
CHLAMYDIA, DNA PROBE: NEGATIVE
Candida vaginitis: NEGATIVE
NEISSERIA GONORRHEA: NEGATIVE
TRICH (WINDOWPATH): NEGATIVE

## 2017-05-03 LAB — CYTOLOGY - PAP
Diagnosis: NEGATIVE
HPV: NOT DETECTED

## 2017-05-04 ENCOUNTER — Encounter (HOSPITAL_COMMUNITY): Payer: Self-pay | Admitting: Emergency Medicine

## 2017-05-04 ENCOUNTER — Ambulatory Visit (HOSPITAL_COMMUNITY)
Admission: EM | Admit: 2017-05-04 | Discharge: 2017-05-04 | Disposition: A | Discharge status: Home / Self Care | Payer: Medicare Other | Attending: Family Medicine | Admitting: Family Medicine

## 2017-05-04 DIAGNOSIS — J029 Acute pharyngitis, unspecified: Secondary | ICD-10-CM

## 2017-05-04 LAB — POCT RAPID STREP A: STREPTOCOCCUS, GROUP A SCREEN (DIRECT): NEGATIVE

## 2017-05-04 MED ORDER — CETIRIZINE HCL 10 MG PO TABS: 10.0000 mg | ORAL_TABLET | Freq: Every day | ORAL | 0 refills | Status: DC

## 2017-05-04 NOTE — ED Triage Notes (Signed)
Pt here for ST onset 6 days associated cough and bilateral eye drainage  Reports she is legally blind .... Walker present upon arrival... Here w/son at bedside  A&O x4... NAD... Ambulatory

## 2017-05-04 NOTE — Discharge Instructions (Signed)
You may try [employer] PEROXYL gargle solution as needed. You can buy this over the counter.

## 2017-05-06 LAB — CULTURE, GROUP A STREP (THRC)

## 2017-05-06 NOTE — ED Provider Notes (Signed)
  [name]   [id number] 05/04/17 Arrival Time: 1205  ASSESSMENT & PLAN:  1. Sore throat     Meds ordered this encounter  Medications  . cetirizine (ZYRTEC) 10 MG tablet    Sig: Take 1 tablet (10 mg total) by mouth daily.    Dispense:  20 tablet    Refill:  0   RS: negative. Culture sent. Most likely URI related/sinus drainage.  OTC analgesics and throat care as needed. She should be seeing improvement within the next several days.  Reviewed expectations re: course of current medical issues. Questions answered. Outlined signs and symptoms indicating need for more acute intervention. Patient verbalized understanding. After Visit Summary given.   SUBJECTIVE:  Andrea Berry is a 51 y.o. female who presents with complaint of sore throat for the past 5-6 days. No fever or chills reported. Overall decreased PO intake secondary to symptoms. No respiratory symptoms. Mild nasal congestion and post-nasal drainage. No OTC treatment.  ROS: As per HPI.   OBJECTIVE:  Vitals:   05/04/17 1233  BP: (!) 143/101  Pulse: 75  Resp: 16  Temp: 99.9 F (37.7 C)  TempSrc: Oral  SpO2: 96%     General appearance: alert; no distress HEENT: throat irritated; cobblestoning Neck: supple with FROM; no LAD Lungs: clear to auscultation bilaterally Skin: warm and dry Psychological:  alert and cooperative; normal mood and affect  Results for orders placed or performed during the hospital encounter of 05/04/17  POCT rapid strep A [facility])  Result Value Ref Range   Streptococcus, Group A Screen (Direct) NEGATIVE NEGATIVE    Labs Reviewed  CULTURE, GROUP A STREP [facility])  POCT RAPID STREP A     No Known Allergies  Past Medical History:  Diagnosis Date  . Arthritis   . Blind   . Depression   . GERD (gastroesophageal reflux disease)   . Headache(784.0)   . Hypertension   . Kidney stones   . Lupus   . Shortness of breath    due to medication  . Stroke [facility]) 2008    visually impaired          Vanessa Kick, MD 05/06/17 [phone]

## 2017-05-07 ENCOUNTER — Other Ambulatory Visit: Payer: Self-pay | Admitting: Certified Nurse Midwife

## 2017-05-21 ENCOUNTER — Ambulatory Visit
Admission: RE | Admit: 2017-05-21 | Discharge: 2017-05-21 | Disposition: A | Discharge status: Home / Self Care | Payer: Medicare Other | Source: Ambulatory Visit | Attending: Certified Nurse Midwife | Admitting: Certified Nurse Midwife

## 2017-05-21 DIAGNOSIS — Z1231 Encounter for screening mammogram for malignant neoplasm of breast: Secondary | ICD-10-CM

## 2017-06-10 ENCOUNTER — Ambulatory Visit
Admission: RE | Admit: 2017-06-10 | Discharge: 2017-06-10 | Disposition: A | Discharge status: Home / Self Care | Payer: Medicare Other | Source: Ambulatory Visit | Attending: Nurse Practitioner | Admitting: Nurse Practitioner

## 2017-06-10 ENCOUNTER — Other Ambulatory Visit: Payer: Self-pay | Admitting: Nurse Practitioner

## 2017-06-10 DIAGNOSIS — R053 Chronic cough: Secondary | ICD-10-CM

## 2017-06-10 DIAGNOSIS — R05 Cough: Secondary | ICD-10-CM

## 2017-06-14 ENCOUNTER — Other Ambulatory Visit: Payer: Self-pay | Admitting: Nurse Practitioner

## 2017-06-14 DIAGNOSIS — R319 Hematuria, unspecified: Secondary | ICD-10-CM

## 2017-06-18 ENCOUNTER — Ambulatory Visit
Admission: RE | Admit: 2017-06-18 | Discharge: 2017-06-18 | Disposition: A | Discharge status: Home / Self Care | Payer: Medicare Other | Source: Ambulatory Visit | Attending: Nurse Practitioner | Admitting: Nurse Practitioner

## 2017-06-18 ENCOUNTER — Other Ambulatory Visit: Payer: Medicare Other

## 2017-06-18 DIAGNOSIS — R319 Hematuria, unspecified: Secondary | ICD-10-CM

## 2017-07-03 ENCOUNTER — Other Ambulatory Visit: Payer: Self-pay | Admitting: Gastroenterology

## 2017-07-18 ENCOUNTER — Encounter (HOSPITAL_COMMUNITY): Payer: Self-pay

## 2017-07-22 ENCOUNTER — Other Ambulatory Visit: Payer: Self-pay

## 2017-07-22 ENCOUNTER — Encounter (HOSPITAL_COMMUNITY): Payer: Self-pay | Admitting: Emergency Medicine

## 2017-07-26 ENCOUNTER — Encounter (HOSPITAL_COMMUNITY): Payer: Self-pay | Admitting: *Deleted

## 2017-07-26 ENCOUNTER — Ambulatory Visit (HOSPITAL_COMMUNITY): Payer: Medicare Other | Admitting: Anesthesiology

## 2017-07-26 ENCOUNTER — Encounter (HOSPITAL_COMMUNITY)
Admission: RE | Disposition: A | Discharge status: Home / Self Care | Payer: Self-pay | Source: Ambulatory Visit | Attending: Gastroenterology

## 2017-07-26 ENCOUNTER — Other Ambulatory Visit: Payer: Self-pay

## 2017-07-26 ENCOUNTER — Ambulatory Visit (HOSPITAL_COMMUNITY)
Admission: RE | Admit: 2017-07-26 | Discharge: 2017-07-26 | Disposition: A | Discharge status: Home / Self Care | Payer: Medicare Other | Source: Ambulatory Visit | Attending: Gastroenterology | Admitting: Gastroenterology

## 2017-07-26 DIAGNOSIS — R1084 Generalized abdominal pain: Secondary | ICD-10-CM | POA: Diagnosis not present

## 2017-07-26 DIAGNOSIS — Z8673 Personal history of transient ischemic attack (TIA), and cerebral infarction without residual deficits: Secondary | ICD-10-CM | POA: Diagnosis not present

## 2017-07-26 DIAGNOSIS — Z86010 Personal history of colonic polyps: Secondary | ICD-10-CM | POA: Insufficient documentation

## 2017-07-26 DIAGNOSIS — M329 Systemic lupus erythematosus, unspecified: Secondary | ICD-10-CM | POA: Insufficient documentation

## 2017-07-26 DIAGNOSIS — H547 Unspecified visual loss: Secondary | ICD-10-CM | POA: Insufficient documentation

## 2017-07-26 DIAGNOSIS — K219 Gastro-esophageal reflux disease without esophagitis: Secondary | ICD-10-CM | POA: Diagnosis not present

## 2017-07-26 DIAGNOSIS — I10 Essential (primary) hypertension: Secondary | ICD-10-CM | POA: Insufficient documentation

## 2017-07-26 DIAGNOSIS — M1990 Unspecified osteoarthritis, unspecified site: Secondary | ICD-10-CM | POA: Diagnosis not present

## 2017-07-26 DIAGNOSIS — D123 Benign neoplasm of transverse colon: Secondary | ICD-10-CM | POA: Insufficient documentation

## 2017-07-26 HISTORY — DX: Personal history of urinary calculi: Z87.442

## 2017-07-26 HISTORY — PX: COLONOSCOPY WITH PROPOFOL: SHX5780

## 2017-07-26 HISTORY — PX: ESOPHAGOGASTRODUODENOSCOPY (EGD) WITH PROPOFOL: SHX5813

## 2017-07-26 SURGERY — COLONOSCOPY WITH PROPOFOL
Anesthesia: Monitor Anesthesia Care

## 2017-07-26 MED ORDER — PROPOFOL 500 MG/50ML IV EMUL
INTRAVENOUS | Status: DC | PRN
  Administered 2017-07-26: 60 mg via INTRAVENOUS

## 2017-07-26 MED ORDER — PROPOFOL 500 MG/50ML IV EMUL
INTRAVENOUS | Status: DC | PRN
  Administered 2017-07-26: 150 ug/kg/min via INTRAVENOUS

## 2017-07-26 MED ORDER — LIDOCAINE HCL (CARDIAC) 20 MG/ML IV SOLN: INTRAVENOUS | Status: DC | PRN

## 2017-07-26 MED ORDER — LACTATED RINGERS IV SOLN
INTRAVENOUS | Status: DC | PRN
  Administered 2017-07-26: 12:00:00 via INTRAVENOUS

## 2017-07-26 MED ORDER — PROPOFOL 10 MG/ML IV BOLUS
INTRAVENOUS | Status: AC
  Filled 2017-07-26: qty 60

## 2017-07-26 MED ORDER — LIDOCAINE 2% (20 MG/ML) 5 ML SYRINGE
INTRAMUSCULAR | Status: AC
  Filled 2017-07-26: qty 5

## 2017-07-26 MED ORDER — SODIUM CHLORIDE 0.9 % IV SOLN: INTRAVENOUS | Status: DC

## 2017-07-26 MED ORDER — LIDOCAINE HCL (CARDIAC) 20 MG/ML IV SOLN
INTRAVENOUS | Status: DC | PRN
  Administered 2017-07-26: 60 mg via INTRAVENOUS

## 2017-07-26 SURGICAL SUPPLY — 21 items

## 2017-07-26 NOTE — H&P (Signed)
Andrea Berry HPI: For the past 8 months the patient reports issues with intermittent abdominal pain, nausea, and vomiting. A CT scan on 04/22/2017 was performed for complaints of abdominal pain, nausea, and vomiting. The scan revealed a 4 mm stone in the right proximal ureter without hydronephrosis as well as lung base scarring and atelectasis, R>L. She was treated conservatively with Zofran and Dilaudid in the ER and discharged home. The patient reports having a colonoscopy in 2011 with findings of polyps. She does not know what triggers her symptoms. The patient reports a long history of constipation and she uses Linzess, with some efficacy. On average she will have a bowel movement, with Linzess, 2-3 times per week. If she did not have the medication she thinks that she would have one bowel movement per week. There is no change to her medication of late. As a result of her Lupus she developed a CVA last year, which is why she takes Xarelto. Prednisone and AZA are used to control her Lupus.   Past Medical History:  Diagnosis Date  . Arthritis   . Blind   . Depression   . GERD (gastroesophageal reflux disease)   . Headache(784.0)   . History of kidney stones   . Hypertension   . Lupus   . Shortness of breath    due to medication  . Stroke [facility]) 2008   visually impaired    Past Surgical History:  Procedure Laterality Date  . BREAST BIOPSY    . BREAST CYST EXCISION Left pt unsure  . TUBAL LIGATION    . TUBAL LIGATION      Family History  Problem Relation Age of Onset  . Cancer Mother   . Cancer Father   . Lupus Sister     Social History:  reports that  has never smoked. she has never used smokeless tobacco. She reports that she does not drink alcohol or use drugs.  Allergies: No Known Allergies  Medications:  Scheduled:  Continuous: . sodium chloride      No results found for this or any previous visit (from the past 24 hour(s)).   No results found.  ROS:   As stated above in the HPI otherwise negative.  Blood pressure (!) 141/84, pulse 74, temperature 99.4 F (37.4 C), temperature source Oral, resp. rate 13, height 5\' 1"  (1.549 m), weight 81.6 kg (180 lb), SpO2 100 %.    PE: Gen: NAD, Alert and Oriented HEENT:  [place]/AT, EOMI Neck: Supple, no LAD Lungs: CTA Bilaterally CV: RRR without M/G/R ABM: Soft, NTND, +BS Ext: No C/C/E  Assessment/Plan: 1) Nausea and vomiting. 2) Personal history of polyps.  She reports that her pain is typically in the bilateral lower abdomen and the left side. There is no association with her constipation and she does not notice a correlation with her Lupus and abdominal pain. However, with further questioning she does recall that when she had her CVA, as a result of her Lupus flare, she was experiencing abdominal pain that was remedied with Dilaudid in the nursing home. The patient does not take any narcotic medications and Aleve seems to do a better job at times. The source of her pain is likely a result of her Lupus and/or transverse myelitis. She has a history of severe neuropathic pain. I was able to review her note from [facility] from 04/24/2016. With her symptoms it is reasonable to perform an EGD for further evaluation, but ultimately, I believe symptomatic management will be  the only option.   EGD/Colonoscopy    [name] D 07/26/2017, 11:03 AM

## 2017-07-26 NOTE — Discharge Instructions (Signed)
Esophagogastroduodenoscopy, Care After °Refer to this sheet in the next few weeks. These instructions provide you with information about caring for yourself after your procedure. Your health care provider may also give you more specific instructions. Your treatment has been planned according to current medical practices, but problems sometimes occur. Call your health care provider if you have any problems or questions after your procedure. °What can I expect after the procedure? °After the procedure, it is common to have: °· A sore throat. °· Nausea. °· Bloating. °· Dizziness. °· Fatigue. ° °Follow these instructions at home: °· Do not eat or drink anything until the numbing medicine (local anesthetic) has worn off and your gag reflex has returned. You will know that the local anesthetic has worn off when you can swallow comfortably. °· Do not drive for 24 hours if you received a medicine to help you relax (sedative). °· If your health care provider took a tissue sample for testing during the procedure, make sure to get your test results. This is your responsibility. Ask your health care provider or the department performing the test when your results will be ready. °· Keep all follow-up visits as told by your health care provider. This is important. °Contact a health care provider if: °· You cannot stop coughing. °· You are not urinating. °· You are urinating less than usual. °Get help right away if: °· You have trouble swallowing. °· You cannot eat or drink. °· You have throat or chest pain that gets worse. °· You are dizzy or light-headed. °· You faint. °· You have nausea or vomiting. °· You have chills. °· You have a fever. °· You have severe abdominal pain. °· You have black, tarry, or bloody stools. °This information is not intended to replace advice given to you by your health care provider. Make sure you discuss any questions you have with your health care provider. °Document Released: 08/13/2012 Document  Revised: 02/02/2016 Document Reviewed: 07/21/2015 °Elsevier Interactive Patient Education © 2018 [employer] °Colonoscopy, Adult, Care After °This sheet gives you information about how to care for yourself after your procedure. Your doctor may also give you more specific instructions. If you have problems or questions, call your doctor. °Follow these instructions at home: °General instructions ° °· For the first 24 hours after the procedure: °? Do not drive or use machinery. °? Do not sign important documents. °? Do not drink alcohol. °? Do your daily activities more slowly than normal. °? Eat foods that are soft and easy to digest. °? Rest often. °· Take over-the-counter or prescription medicines only as told by your doctor. °· It is up to you to get the results of your procedure. Ask your doctor, or the department performing the procedure, when your results will be ready. °To help cramping and bloating: °· Try walking around. °· Put heat on your belly (abdomen) as told by your doctor. Use a heat source that your doctor recommends, such as a moist heat pack or a heating pad. °? Put a towel between your skin and the heat source. °? Leave the heat on for 20-30 minutes. °? Remove the heat if your skin turns bright red. This is especially important if you cannot feel pain, heat, or cold. You can get burned. °Eating and drinking °· Drink enough fluid to keep your pee (urine) clear or pale yellow. °· Return to your normal diet as told by your doctor. Avoid heavy or fried foods that are hard to digest. °· Avoid   drinking alcohol for as long as told by your doctor. °Contact a doctor if: °· You have blood in your poop (stool) 2-3 days after the procedure. °Get help right away if: °· You have more than a small amount of blood in your poop. °· You see large clumps of tissue (blood clots) in your poop. °· Your belly is swollen. °· You feel sick to your stomach (nauseous). °· You throw up (vomit). °· You have a fever. °· You  have belly pain that gets worse, and medicine does not help your pain. °This information is not intended to replace advice given to you by your health care provider. Make sure you discuss any questions you have with your health care provider. °Document Released: 09/29/2010 Document Revised: 05/21/2016 Document Reviewed: 05/21/2016 °Elsevier Interactive Patient Education © 2017 [employer] ° °

## 2017-07-26 NOTE — Op Note (Signed)
[facility] Patient Name: Andrea Berry Procedure Date: 07/26/2017 MRN: [id number] Attending MD: Carol Ada , MD Date of Birth: [birth year] CSN: [id number] Age: 51 Admit Type: Outpatient Procedure:                Upper GI endoscopy Indications:              Generalized abdominal pain Providers:                Carol Ada, MD, Tinnie Gens, Technician, Carmie End, RN Referring MD:              Medicines:                Propofol per Anesthesia Complications:            No immediate complications. Estimated Blood Loss:     Estimated blood loss: none. Procedure:                Pre-Anesthesia Assessment:                           - Prior to the procedure, a History and Physical                            was performed, and patient medications and                            allergies were reviewed. The patient's tolerance of                            previous anesthesia was also reviewed. The risks                            and benefits of the procedure and the sedation                            options and risks were discussed with the patient.                            All questions were answered, and informed consent                            was obtained. Prior Anticoagulants: The patient has                            taken no previous anticoagulant or antiplatelet                            agents. ASA Grade Assessment: III - A patient with                            severe systemic disease. After reviewing the risks  and benefits, the patient was deemed in                            satisfactory condition to undergo the procedure.                           - Sedation was administered by an anesthesia                            professional. Deep sedation was attained.                           After obtaining informed consent, the endoscope was                            passed under direct vision.  Throughout the                            procedure, the patient's blood pressure, pulse, and                            oxygen saturations were monitored continuously. The                            EC-3490LI ([id number]) scope was introduced through                            the mouth, and advanced to the second part of                            duodenum. The upper GI endoscopy was accomplished                            without difficulty. The patient tolerated the                            procedure well. Scope In: Scope Out: Findings:      A 3 cm hiatal hernia was present.      The stomach was normal.      The examined duodenum was normal. Impression:               - 3 cm hiatal hernia.                           - Normal stomach.                           - Normal examined duodenum.                           - No specimens collected. Moderate Sedation:      N/A- Per Anesthesia Care Recommendation:           - Patient has a contact number available for  emergencies. The signs and symptoms of potential                            delayed complications were discussed with the                            patient. Return to normal activities tomorrow.                            Written discharge instructions were provided to the                            patient.                           - Resume previous diet.                           - Continue present medications. Procedure Code(s):        --- Professional ---                           [phone], Esophagogastroduodenoscopy, flexible,                            transoral; diagnostic, including collection of                            specimen(s) by brushing or washing, when performed                            (separate procedure) Diagnosis Code(s):        --- Professional ---                           K44.9, Diaphragmatic hernia without obstruction or                            gangrene                            R10.84, Generalized abdominal pain CPT copyright 2016 American Medical Association. All rights reserved. The codes documented in this report are preliminary and upon coder review may  be revised to meet current compliance requirements. Carol Ada, MD Carol Ada, MD 07/26/2017 1:00:56 PM This report has been signed electronically. Number of Addenda: 0

## 2017-07-26 NOTE — Op Note (Signed)
[facility] Patient Name: Andrea Berry Procedure Date: 07/26/2017 MRN: [id number] Attending MD: Carol Ada , MD Date of Birth: [birth year] CSN: [id number] Age: 51 Admit Type: Outpatient Procedure:                Colonoscopy Indications:              Generalized abdominal pain Providers:                Carol Ada, MD, Tinnie Gens, Technician Referring MD:              Medicines:                Propofol per Anesthesia Complications:            No immediate complications. Estimated Blood Loss:     Estimated blood loss: none. Procedure:                Pre-Anesthesia Assessment:                           - Prior to the procedure, a History and Physical                            was performed, and patient medications and                            allergies were reviewed. The patient's tolerance of                            previous anesthesia was also reviewed. The risks                            and benefits of the procedure and the sedation                            options and risks were discussed with the patient.                            All questions were answered, and informed consent                            was obtained. Prior Anticoagulants: The patient has                            taken Xarelto (rivaroxaban), last dose was 2 days                            prior to procedure. ASA Grade Assessment: III - A                            patient with severe systemic disease. After                            reviewing the risks and benefits, the patient was  deemed in satisfactory condition to undergo the                            procedure.                           - Sedation was administered by an anesthesia                            professional. Deep sedation was attained.                           After obtaining informed consent, the colonoscope                            was passed under direct vision. Throughout the                           procedure, the patient's blood pressure, pulse, and                            oxygen saturations were monitored continuously. The                            EC-3490LI ([id number]) scope was introduced through                            the anus and advanced to the the cecum, identified                            by appendiceal orifice and ileocecal valve. The                            colonoscopy was performed without difficulty. The                            patient tolerated the procedure well. The quality                            of the bowel preparation was excellent. The                            ileocecal valve, appendiceal orifice, and rectum                            were photographed. Scope In: 12:35:29 PM Scope Out: 12:55:49 PM Scope Withdrawal Time: 0 hours 16 minutes 57 seconds  Total Procedure Duration: 0 hours 20 minutes 20 seconds  Findings:      A 15 mm polyp was found in the transverse colon. The polyp was       semi-sessile. The polyp was removed with a hot snare. Resection and       retrieval were complete. To prevent bleeding post-intervention, one       hemostatic clip was successfully placed (MR unsafe). There was no       bleeding at  the end of the procedure.      A 3 mm polyp was found in the transverse colon. The polyp was sessile.       The polyp was removed with a cold snare. Resection and retrieval were       complete. Impression:               - One 15 mm polyp in the transverse colon, removed                            with a hot snare. Resected and retrieved. Clip (MR                            unsafe) was placed.                           - One 3 mm polyp in the transverse colon, removed                            with a cold snare. Resected and retrieved. Moderate Sedation:      N/A- Per Anesthesia Care Recommendation:           - Patient has a contact number available for                            emergencies. The signs and  symptoms of potential                            delayed complications were discussed with the                            patient. Return to normal activities tomorrow.                            Written discharge instructions were provided to the                            patient.                           - Resume previous diet.                           - Continue present medications.                           - Await pathology results.                           - Repeat colonoscopy in 3 - 5 years for                            surveillance. Procedure Code(s):        --- Professional ---                           [phone], Colonoscopy, flexible; with removal of  tumor(s), polyp(s), or other lesion(s) by snare                            technique Diagnosis Code(s):        --- Professional ---                           D12.3, Benign neoplasm of transverse colon (hepatic                            flexure or splenic flexure)                           R10.84, Generalized abdominal pain CPT copyright 2016 American Medical Association. All rights reserved. The codes documented in this report are preliminary and upon coder review may  be revised to meet current compliance requirements. Carol Ada, MD Carol Ada, MD 07/26/2017 1:04:16 PM This report has been signed electronically. Number of Addenda: 0

## 2017-07-26 NOTE — Anesthesia Preprocedure Evaluation (Addendum)
Anesthesia Evaluation  Patient identified by MRN, date of birth, ID band Patient awake    Reviewed: Allergy & Precautions, NPO status , Patient's Chart, lab work & pertinent test results  Airway Mallampati: II  TM Distance: >3 FB Neck ROM: Full    Dental no notable dental hx.    Pulmonary neg pulmonary ROS,    Pulmonary exam normal breath sounds clear to auscultation       Cardiovascular negative cardio ROS Normal cardiovascular exam Rhythm:Regular Rate:Normal     Neuro/Psych  Headaches, PSYCHIATRIC DISORDERS Anxiety Depression CVA    GI/Hepatic Neg liver ROS, GERD  Medicated and Controlled,polyps   Endo/Other  negative endocrine ROS  Renal/GU negative Renal ROS     Musculoskeletal  (+) Arthritis ,   Abdominal (+) + obese,   Peds  Hematology negative hematology ROS (+)   Anesthesia Other Findings Lupus Blind Ambulates with walker  Reproductive/Obstetrics                            Anesthesia Physical Anesthesia Plan  ASA: III  Anesthesia Plan: MAC   Post-op Pain Management:    Induction: Intravenous  PONV Risk Score and Plan: 2 and Propofol infusion  Airway Management Planned: Natural Airway  Additional Equipment:   Intra-op Plan:   Post-operative Plan:   Informed Consent: I have reviewed the patients History and Physical, chart, labs and discussed the procedure including the risks, benefits and alternatives for the proposed anesthesia with the patient or authorized representative who has indicated his/her understanding and acceptance.   Dental advisory given  Plan Discussed with: CRNA  Anesthesia Plan Comments:        Anesthesia Quick Evaluation

## 2017-07-26 NOTE — Transfer of Care (Signed)
Immediate Anesthesia Transfer of Care Note  Patient: Andrea Berry  Procedure(s) Performed: COLONOSCOPY WITH PROPOFOL (N/A )  Patient Location: PACU and Endoscopy Unit  Anesthesia Type:MAC  Level of Consciousness: awake, alert  and oriented  Airway & Oxygen Therapy: Patient connected to nasal cannula oxygen  Post-op Assessment: Report given to RN and Post -op Vital signs reviewed and stable  Post vital signs: Reviewed and stable  Last Vitals:  Vitals:   07/26/17 1055  BP: (!) 141/84  Pulse: 74  Resp: 13  Temp: 37.4 C  SpO2: 100%    Last Pain:  Vitals:   07/26/17 1055  TempSrc: Oral  PainSc: 8          Complications: No apparent anesthesia complications

## 2017-07-29 ENCOUNTER — Encounter (HOSPITAL_COMMUNITY): Payer: Self-pay | Admitting: Gastroenterology

## 2017-07-29 NOTE — Anesthesia Postprocedure Evaluation (Signed)
Anesthesia Post Note  Patient: Andrea Berry  Procedure(s) Performed: COLONOSCOPY WITH PROPOFOL (N/A ) ESOPHAGOGASTRODUODENOSCOPY (EGD) WITH PROPOFOL (N/A )     Patient location during evaluation: PACU Anesthesia Type: MAC Level of consciousness: awake and alert Pain management: pain level controlled Vital Signs Assessment: post-procedure vital signs reviewed and stable Respiratory status: spontaneous breathing, nonlabored ventilation, respiratory function stable and patient connected to nasal cannula oxygen Cardiovascular status: stable and blood pressure returned to baseline Postop Assessment: no apparent nausea or vomiting Anesthetic complications: no    Last Vitals:  Vitals:   07/26/17 1305 07/26/17 1310  BP: (!) 122/47 131/63  Pulse: 88 81  Resp: 20 17  Temp:    SpO2: 100% 100%    Last Pain:  Vitals:   07/26/17 1055  TempSrc: Oral  PainSc: 8                  Rajanee Schuelke P Psalm Arman

## 2017-08-14 ENCOUNTER — Encounter: Payer: Self-pay | Admitting: Physical Medicine & Rehabilitation

## 2017-09-12 ENCOUNTER — Ambulatory Visit (HOSPITAL_BASED_OUTPATIENT_CLINIC_OR_DEPARTMENT_OTHER): Payer: Medicare Other | Admitting: Physical Medicine & Rehabilitation

## 2017-09-12 ENCOUNTER — Encounter: Payer: Medicare Other | Attending: Physical Medicine & Rehabilitation

## 2017-09-12 ENCOUNTER — Encounter: Payer: Self-pay | Admitting: Physical Medicine & Rehabilitation

## 2017-09-12 ENCOUNTER — Other Ambulatory Visit: Payer: Self-pay

## 2017-09-12 VITALS — BP 136/87 | HR 98

## 2017-09-12 DIAGNOSIS — G360 Neuromyelitis optica [Devic]: Secondary | ICD-10-CM | POA: Diagnosis not present

## 2017-09-12 DIAGNOSIS — G8929 Other chronic pain: Secondary | ICD-10-CM

## 2017-09-12 DIAGNOSIS — M792 Neuralgia and neuritis, unspecified: Secondary | ICD-10-CM

## 2017-09-12 DIAGNOSIS — M79632 Pain in left forearm: Secondary | ICD-10-CM | POA: Diagnosis present

## 2017-09-12 DIAGNOSIS — I69954 Hemiplegia and hemiparesis following unspecified cerebrovascular disease affecting left non-dominant side: Secondary | ICD-10-CM | POA: Insufficient documentation

## 2017-09-12 DIAGNOSIS — G8194 Hemiplegia, unspecified affecting left nondominant side: Secondary | ICD-10-CM | POA: Diagnosis not present

## 2017-09-12 DIAGNOSIS — G9389 Other specified disorders of brain: Secondary | ICD-10-CM | POA: Insufficient documentation

## 2017-09-12 MED ORDER — TRAMADOL HCL 50 MG PO TABS: 50.0000 mg | ORAL_TABLET | Freq: Three times a day (TID) | ORAL | 5 refills | Status: DC

## 2017-09-12 NOTE — Progress Notes (Signed)
Subjective:    Patient ID: Andrea Berry, female    DOB: [birth year], 52 y.o.   MRN: [id number]  HPI  Chief complaint is left forearm pain  52 year old female with history of CVA in 2008, she had onset of left hemiparesis in January 2017 and further workup revealed diffuse spinal cord enlargement most consistent with either neuromyelitis optica or acute transverse myelitis.  There was a small lesion in the left thalamus which had a differential of subacute CVA versus demyelinating lesion Patient was placed on Solu-Medrol and plasmapheresis which was not very helpful, IVIG was partially helpful. 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 High-dose IV steroids were prescribed neurology diagnosed optic neuritis. She was discharged to a skilled nursing facility. Patient had outpatient follow-up with general neurology but was referred to the [place] [facility] at [facility] Was seen by neurology, Dr. Felecia Shelling for lupus cerebritis as well as painful spasms left arm and right leg.  Patient did not benefit from benzodiazepines Soma or Flexeril.  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  Underwent several rounds of Rituxan.  Followed by Dr. Shelia Media  Past medical history also positive for renal stones.  Reports last dose of tramadol and lorazepam  approx 4 days ago Legally Blind, patient thinks it is from stroke however neurology believes this is from neuromyelitis optica  Pain Inventory Average Pain 10 Pain Right Now 10 My pain is dull  In the last 24 hours, has pain interfered with the following? General activity 8 Relation with others 8 Enjoyment of life 9 What TIME of day is your pain at its worst? evening Sleep (in general) Fair  Pain is worse with: walking, bending, sitting, inactivity and standing Pain improves with: rest and medication Relief from Meds: 9  Mobility walk without assistance walk with assistance how many minutes can you walk? 3 ability to climb steps?  no do you drive?  no use a wheelchair needs help with transfers  Function disabled: date disabled legally blind I need assistance with the following:  dressing, bathing, meal prep, household duties and shopping  Neuro/Psych weakness numbness tingling trouble walking depression anxiety  Prior Studies Any changes since last visit?  no  Physicians involved in your care Primary care [name]   Family History  Problem Relation Age of Onset  . Cancer Mother   . Cancer Father   . Lupus Sister    Social History   Socioeconomic History  . Marital status: Single    Spouse name: None  . Number of children: None  . Years of education: None  . Highest education level: None  Social Needs  . Financial resource strain: None  . Food insecurity - worry: None  . Food insecurity - inability: None  . Transportation needs - medical: None  . Transportation needs - non-medical: None  Occupational History  . None  Tobacco Use  . Smoking status: Never Smoker  .  Smokeless tobacco: Never Used  Substance and Sexual Activity  . Alcohol use: No  . Drug use: No  . Sexual activity: Yes    Partners: Male    Birth control/protection: Post-menopausal, Surgical  Other Topics Concern  . None  Social History Narrative  . None   Past Surgical  History:  Procedure Laterality Date  . BREAST BIOPSY    . BREAST CYST EXCISION Left pt unsure  . COLONOSCOPY WITH PROPOFOL N/A 07/26/2017   Procedure: COLONOSCOPY WITH PROPOFOL;  Surgeon: Carol Ada, MD;  Location: [facility];  Service: Endoscopy;  Laterality: N/A;  . ESOPHAGOGASTRODUODENOSCOPY (EGD) WITH PROPOFOL N/A 07/26/2017   Procedure: ESOPHAGOGASTRODUODENOSCOPY (EGD) WITH PROPOFOL;  Surgeon: Carol Ada, MD;  Location: [facility];  Service: Endoscopy;  Laterality: N/A;  . TUBAL LIGATION    . TUBAL LIGATION     Past Medical History:  Diagnosis Date  . Arthritis   . Blind   . Depression   . GERD (gastroesophageal reflux disease)   . Headache(784.0)   . History of kidney stones   . Hypertension   . Lupus   . Shortness of breath    due to medication  . Stroke [facility]) 2008   visually impaired   BP 136/87   Pulse 98   SpO2 96%   Opioid Risk Score:  2 Fall Risk Score:  `1  Depression screen PHQ 2/9  Depression screen PHQ 2/9 09/12/2017  Decreased Interest 2  Down, Depressed, Hopeless 2  PHQ - 2 Score 4  Altered sleeping 1  Tired, decreased energy 2  Change in appetite 1  Feeling bad or failure about yourself  2  Trouble concentrating 0  Moving slowly or fidgety/restless 0  Suicidal thoughts 0  PHQ-9 Score 10  Difficult doing work/chores Somewhat difficult    Review of Systems  Constitutional: Positive for diaphoresis.  HENT: Negative.   Eyes:       Legally blind  Respiratory: Positive for cough.   Cardiovascular: Negative.   Gastrointestinal: Positive for constipation, diarrhea and nausea.  Endocrine: Negative.   Genitourinary: Negative.   Musculoskeletal: Positive for gait problem.  Skin: Positive for rash.  Allergic/Immunologic: Negative.   Neurological: Positive for weakness and numbness.       Tingling  Hematological: Bruises/bleeds easily.       Xarelto  Psychiatric/Behavioral: Positive for dysphoric mood. The patient is nervous/anxious.     All other systems reviewed and are negative.      Objective:   Physical Exam  Constitutional: She is oriented to person, place, and time. She appears well-developed and well-nourished.  HENT:  Head: Normocephalic and atraumatic.  Eyes: Pupils are equal, round, and reactive to light. Right eye exhibits no discharge. Left eye exhibits no discharge. No scleral icterus.  Extraocular muscles difficult to evaluate secondary to decreased vision.  She is able to count how many fingers are held 15 inches from her face. She can identify lighter colors versus darker colors and can tell larger shapes  Neck: Normal range of motion. No JVD present.  Cardiovascular: Normal rate, regular rhythm and normal heart sounds.  No murmur heard. Pulmonary/Chest: Effort normal and breath sounds normal. No stridor. No respiratory distress. She has no wheezes.  Abdominal: Soft. Bowel sounds are normal. She exhibits no distension. There is no tenderness.  Neurological: She is alert and oriented to person, place, and time. Gait abnormal.  Motor strength is 2+ in the left deltoid, bicep tricep finger flexors and extensors, 3- at the left  hip flexor knee extensor ankle dorsiflexor plantar flexor 5 at the right deltoid bicep tricep finger flexors and extensors and 4 at the right hip flexor knee extensor ankle was flexor plantar flexor Tone shows no evidence of clonus at the ankles or wrists and fingers.  No evidence of elevated tone in the upper or lower limbs Patient is in a wheelchair. Patient is able to identify which fingers touched in both upper limbs and is reporting equal sensation in the lower limbs as well.  Her left upper limb has hypersensitivity to touch from the elbow down Her right lower limb has no hypersensitivity to touch but does have some tenderness in the calf with dorsum of the foot swelling on the right side.  There is no evidence of erythema in the lower extremities no open skin areas.  Left upper has  no evidence of joint swelling no sweating skin lesions hand is warm.  There is also some decreased sensation on the left side of the face with some hypersensitivity.   Skin: Skin is warm and dry.  Psychiatric: She has a normal mood and affect.  Nursing note and vitals reviewed.         Assessment & Plan:  #1.  Chronic central neuropathic pain this appears to be involving mainly the left hemibody.  As I discussed with the patient there is no specific treatment for this.  I do agree with the Lyrica 200 mill grams 3 times daily She has tried tramadol but only has taken 1 tablet/day which she felt was not very effective and made her little tired.  She is concerned about getting "stung out" on medications.  We discussed that opioids are generally not very effective for chronic neuropathic central pain although tramadol and tapentadol both have additional neurotransmitter effects that may make them better choices. Recommend tramadol 1 tablet 3 times daily in addition to the Lyrica Follow-up in 6 months

## 2017-09-12 NOTE — Patient Instructions (Signed)
Central post stroke pain syndrome

## 2017-11-06 NOTE — Telephone Encounter (Signed)
Error

## 2018-03-11 ENCOUNTER — Encounter: Payer: Medicare Other | Admitting: Registered Nurse

## 2018-03-11 ENCOUNTER — Ambulatory Visit: Payer: Medicare Other | Admitting: Physical Medicine & Rehabilitation

## 2018-06-11 ENCOUNTER — Other Ambulatory Visit: Payer: Self-pay | Admitting: Nurse Practitioner

## 2018-06-14 ENCOUNTER — Ambulatory Visit (HOSPITAL_COMMUNITY)
Admission: EM | Admit: 2018-06-14 | Discharge: 2018-06-14 | Disposition: A | Discharge status: Home / Self Care | Payer: Medicare Other | Attending: Family Medicine | Admitting: Family Medicine

## 2018-06-14 ENCOUNTER — Other Ambulatory Visit: Payer: Self-pay | Admitting: Nurse Practitioner

## 2018-06-14 ENCOUNTER — Encounter (HOSPITAL_COMMUNITY): Payer: Self-pay | Admitting: *Deleted

## 2018-06-14 DIAGNOSIS — S39012A Strain of muscle, fascia and tendon of lower back, initial encounter: Secondary | ICD-10-CM

## 2018-06-14 DIAGNOSIS — M545 Low back pain, unspecified: Secondary | ICD-10-CM

## 2018-06-14 HISTORY — DX: Neuromyelitis optica (devic): G36.0

## 2018-06-14 MED ORDER — HYDROCODONE-ACETAMINOPHEN 5-325 MG PO TABS: 1.0000 | ORAL_TABLET | Freq: Four times a day (QID) | ORAL | 0 refills | Status: DC | PRN

## 2018-06-14 MED ORDER — TIZANIDINE HCL 2 MG PO TABS: 2.0000 mg | ORAL_TABLET | Freq: Two times a day (BID) | ORAL | 0 refills | Status: DC

## 2018-06-14 NOTE — ED Triage Notes (Signed)
C/O low back pain radiating down BLE x 4 days after bending over.  Denies parasthesias.

## 2018-06-14 NOTE — Discharge Instructions (Signed)
Light and regular activity as tolerated.  Heat application may still be helpful.  Hydrocodone for breakthrough pain, don't take if you are taking tramadol.  Zanaflex as a muscle relaxer, twice a day as needed.  Sleep with a pillow under your knees.  Medications may cause drowsiness. Please do not take if driving or drinking alcohol. Be mindful of increased risk of falling.  Please follow up with your primary care provider for recheck in the next two weeks.

## 2018-06-14 NOTE — ED Provider Notes (Signed)
[facility]    CSN: [id number] Arrival date & time: 06/14/18  1155     History   Chief Complaint Chief Complaint  Patient presents with  . Back Pain    HPI Andrea Berry is a 52 y.o. female.   Andrea Berry presents with her family with complaints of new onset of low back pain which started after she bent over. States was unable to stand back up straight due to the pain. Has had pain since, worse with certain activities. Has a difficult time getting off of toilet for example. Pain worse with transitioning from laying to sitting or sitting to laying. Worse if sits too long or stands too long. Took an aleve which somewhat helped. Heat has helped some. Pain 8/10. Pain just not overall improving. Baseline difficulty with mobility related to hx of stroke, uses a walker and assist with ambulation and position changes. No new numbness tingling or weakness. Pain to buttocks bilaterally, no pain to thighs or down legs. No saddle paresthesia. No loss of bladder or bowel function. Takes tramadol intermittent for chronic pain, took yesterday which didn't help. Hx of depression, Devic's disease, gerd, stroke, htn, lupus.     ROS per HPI.      Past Medical History:  Diagnosis Date  . Arthritis   . Blind   . Depression   . Devic's disease ([place])   . GERD (gastroesophageal reflux disease)   . Headache(784.0)   . History of kidney stones   . Hypertension   . Lupus ([place])   . Shortness of breath    due to medication  . Stroke [facility]) 2008   visually impaired    Patient Active Problem List   Diagnosis Date Noted  . 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[place]) 01/03/2016  . High risk medication use 01/03/2016  . Ataxia 01/03/2016  . Dysesthesia 01/03/2016  . Depression with anxiety 01/03/2016  . Lupus cerebritis ([place]) 11/25/2015  . UTI (lower urinary tract infection) 11/25/2015  . Hyponatremia 11/25/2015  . Anemia of chronic disease 10/28/2015    Past Surgical History:  Procedure  Laterality Date  . BREAST BIOPSY    . BREAST CYST EXCISION Left pt unsure  . COLONOSCOPY WITH PROPOFOL N/A 07/26/2017   Procedure: COLONOSCOPY WITH PROPOFOL;  Surgeon: Carol Ada, MD;  Location: [facility];  Service: Endoscopy;  Laterality: N/A;  . ESOPHAGOGASTRODUODENOSCOPY (EGD) WITH PROPOFOL N/A 07/26/2017   Procedure: ESOPHAGOGASTRODUODENOSCOPY (EGD) WITH PROPOFOL;  Surgeon: Carol Ada, MD;  Location: [facility];  Service: Endoscopy;  Laterality: N/A;  . PORTA CATH INSERTION    . TUBAL LIGATION    . TUBAL LIGATION      OB History    Gravida  3   Para  3   Term  3   Preterm      AB      Living  3     SAB      TAB      Ectopic      Multiple      Live Births  3            Home Medications    Prior to Admission medications   Medication Sig Start Date End Date Taking? Authorizing Provider  alendronate (FOSAMAX) 70 MG tablet Take 70 mg by mouth once a week. Take with a full glass of water on an empty stomach.   Yes [provider]  azaTHIOprine (IMURAN) 50 MG tablet Take 50 mg by mouth 2 (two) times daily.  02/10/13  Yes [provider]  LINZESS 145 MCG CAPS capsule Take 145 mcg by mouth. ON AN EMPTY STOMACH AT LEAST 30 MINUTES BEFORE FIRST MEAL OF THE DAY 06/28/17  Yes [provider]  LORazepam (ATIVAN) 0.5 MG tablet Take 0.5 mg by mouth daily.   Yes [provider]  omeprazole (PRILOSEC) 20 MG capsule Take 20 mg by mouth daily.  01/18/13  Yes [provider]  predniSONE (DELTASONE) 10 MG tablet Take 10 mg daily by mouth.   Yes [provider]  pregabalin (LYRICA) 200 MG capsule Take 200 mg by mouth 3 (three) times daily.   Yes [provider]  RiTUXimab (RITUXAN IV) Inject 1,000 mg into the vein.   Yes [provider]  rivaroxaban (XARELTO) 20 MG TABS tablet Take 20 mg by mouth daily.   Yes [provider]  traMADol (ULTRAM) 50 MG tablet Take 1 tablet (50 mg total) by mouth  3 (three) times daily. 09/12/17  Yes Kirsteins, Luanna Salk, MD  HYDROcodone-acetaminophen (NORCO/VICODIN) 5-325 MG tablet Take 1 tablet by mouth every 6 (six) hours as needed for severe pain. 06/14/18   Zigmund Gottron, NP  tiZANidine (ZANAFLEX) 2 MG tablet Take 1 tablet (2 mg total) by mouth 2 (two) times daily. 06/14/18   Zigmund Gottron, NP    Family History Family History  Problem Relation Age of Onset  . Cancer Mother   . Cancer Father   . Lupus Sister     Social History Social History   Tobacco Use  . Smoking status: Never Smoker  . Smokeless tobacco: Never Used  Substance Use Topics  . Alcohol use: No  . Drug use: No     Allergies   Patient has no known allergies.   Review of Systems Review of Systems   Physical Exam Triage Vital Signs ED Triage Vitals  Enc Vitals Group     BP 06/14/18 1259 125/74     Pulse Rate 06/14/18 1259 (!) 108     Resp 06/14/18 1259 16     Temp 06/14/18 1259 98.1 F (36.7 C)     Temp Source 06/14/18 1259 Oral     SpO2 06/14/18 1259 100 %     Weight --      Height --      Head Circumference --      Peak Flow --      Pain Score 06/14/18 1300 8     Pain Loc --      Pain Edu? --      Excl. in [place]? --    No data found.  Updated Vital Signs BP 125/74   Pulse (!) 108   Temp 98.1 F (36.7 C) (Oral)   Resp 16   SpO2 100%    Physical Exam  Constitutional: She is oriented to person, place, and time. She appears well-developed and well-nourished. No distress.  Cardiovascular: Regular rhythm and normal heart sounds.  Pulmonary/Chest: Effort normal and breath sounds normal.  Musculoskeletal:       Lumbar back: She exhibits decreased range of motion, tenderness, bony tenderness and pain. She exhibits no swelling, no edema, no deformity, no laceration, no spasm and normal pulse.       Back:  Entirety of low back with tenderness, no step off or deformity to spinous process; limited mobility noted, assistance needed to stand out of  wheelchair and transfer to exam table; patient states this is unchanged from baseline for her however; pain with  transition from sit to lay and lay to sit; gross sensation to lower extremities intact and unchanged from baseline for patient; slight left lower extremity weakness, no change from baseline; pain with bilateral straight leg raise, no radiation to thigh; pain with right hip flexion  Neurological: She is alert and oriented to person, place, and time.  Skin: Skin is warm and dry.     UC Treatments / Results  Labs (all labs ordered are listed, but only abnormal results are displayed) Labs Reviewed - No data to display  EKG None  Radiology No results found.  Procedures Procedures (including critical care time)  Medications Ordered in UC Medications - No data to display  Initial Impression / Assessment and Plan / UC Course  I have reviewed the triage vital signs and the nursing notes.  Pertinent labs & imaging results that were available during my care of the patient were reviewed by me and considered in my medical decision making (see chart for details).     Back pain after bending with spasm, pain with use of back. Chronic pain at baseline although not typically to her back. Has been taking tramdol for some time for this, ineffective for acute back pain. No other red flag findings at this time. On a blood thinner and daily prednisone already. 10 tabs of hydrocodone provided for breakthrough pain, zanaflex prn bid. Drowsy precautions provided. Encouraged close follow up with PCP. Patient and family verbalized understanding and agreeable to plan.    Final Clinical Impressions(s) / UC Diagnoses   Final diagnoses:  Strain of lumbar region, initial encounter  Acute bilateral low back pain without sciatica     Discharge Instructions     Light and regular activity as tolerated.  Heat application may still be helpful.  Hydrocodone for breakthrough pain, don't take if you  are taking tramadol.  Zanaflex as a muscle relaxer, twice a day as needed.  Sleep with a pillow under your knees.  Medications may cause drowsiness. Please do not take if driving or drinking alcohol. Be mindful of increased risk of falling.  Please follow up with your primary care provider for recheck in the next two weeks.    ED Prescriptions    Medication Sig Dispense Auth. Provider   HYDROcodone-acetaminophen (NORCO/VICODIN) 5-325 MG tablet Take 1 tablet by mouth every 6 (six) hours as needed for severe pain. 10 tablet Augusto Gamble B, NP   tiZANidine (ZANAFLEX) 2 MG tablet Take 1 tablet (2 mg total) by mouth 2 (two) times daily. 20 tablet Zigmund Gottron, NP     Controlled Substance Prescriptions [place] Controlled Substance Registry consulted? Not Applicable   Zigmund Gottron, NP 06/14/18 1341

## 2018-06-16 ENCOUNTER — Encounter (HOSPITAL_COMMUNITY): Payer: Medicare Other

## 2018-07-02 ENCOUNTER — Encounter (HOSPITAL_COMMUNITY): Payer: Self-pay

## 2018-07-02 ENCOUNTER — Ambulatory Visit (HOSPITAL_COMMUNITY)
Admission: RE | Admit: 2018-07-02 | Discharge: 2018-07-02 | Disposition: A | Discharge status: Home / Self Care | Payer: Medicare Other | Source: Ambulatory Visit | Attending: Neurology | Admitting: Neurology

## 2018-07-02 DIAGNOSIS — G35 Multiple sclerosis: Secondary | ICD-10-CM | POA: Insufficient documentation

## 2018-07-02 DIAGNOSIS — Z452 Encounter for adjustment and management of vascular access device: Secondary | ICD-10-CM | POA: Insufficient documentation

## 2018-07-02 MED ORDER — HEPARIN SOD (PORK) LOCK FLUSH 100 UNIT/ML IV SOLN
500.0000 [IU] | INTRAVENOUS | Status: DC
  Administered 2018-07-02: 500 [IU] via INTRAVENOUS
  Filled 2018-07-02: qty 5

## 2018-07-02 MED ORDER — SODIUM CHLORIDE 0.9% FLUSH
10.0000 mL | INTRAVENOUS | Status: DC
  Administered 2018-07-02: 10 mL via INTRAVENOUS

## 2018-07-11 ENCOUNTER — Other Ambulatory Visit: Payer: Self-pay | Admitting: Nurse Practitioner

## 2018-07-12 ENCOUNTER — Encounter: Payer: Self-pay | Admitting: Nurse Practitioner

## 2018-07-12 DIAGNOSIS — H9209 Otalgia, unspecified ear: Secondary | ICD-10-CM

## 2018-07-12 DIAGNOSIS — G894 Chronic pain syndrome: Secondary | ICD-10-CM

## 2018-07-12 DIAGNOSIS — M542 Cervicalgia: Secondary | ICD-10-CM | POA: Insufficient documentation

## 2018-07-12 DIAGNOSIS — F419 Anxiety disorder, unspecified: Secondary | ICD-10-CM

## 2018-07-12 HISTORY — DX: Otalgia, unspecified ear: H92.09

## 2018-07-12 HISTORY — DX: Chronic pain syndrome: G89.4

## 2018-07-12 HISTORY — DX: Anxiety disorder, unspecified: F41.9

## 2018-07-16 ENCOUNTER — Ambulatory Visit (INDEPENDENT_AMBULATORY_CARE_PROVIDER_SITE_OTHER): Payer: Medicare Other

## 2018-07-16 ENCOUNTER — Ambulatory Visit (INDEPENDENT_AMBULATORY_CARE_PROVIDER_SITE_OTHER): Payer: Medicare Other | Admitting: Nurse Practitioner

## 2018-07-16 ENCOUNTER — Encounter: Payer: Self-pay | Admitting: Nurse Practitioner

## 2018-07-16 VITALS — BP 132/88 | HR 97 | Temp 98.3°F | Ht 61.5 in | Wt 194.2 lb

## 2018-07-16 VITALS — BP 132/88 | HR 97 | Ht 62.0 in | Wt 194.2 lb

## 2018-07-16 DIAGNOSIS — I10 Essential (primary) hypertension: Secondary | ICD-10-CM

## 2018-07-16 DIAGNOSIS — F32A Depression, unspecified: Secondary | ICD-10-CM

## 2018-07-16 DIAGNOSIS — M3219 Other organ or system involvement in systemic lupus erythematosus: Secondary | ICD-10-CM | POA: Diagnosis not present

## 2018-07-16 DIAGNOSIS — H548 Legal blindness, as defined in USA: Secondary | ICD-10-CM

## 2018-07-16 DIAGNOSIS — Z23 Encounter for immunization: Secondary | ICD-10-CM

## 2018-07-16 DIAGNOSIS — H6690 Otitis media, unspecified, unspecified ear: Secondary | ICD-10-CM

## 2018-07-16 DIAGNOSIS — F419 Anxiety disorder, unspecified: Secondary | ICD-10-CM | POA: Diagnosis not present

## 2018-07-16 DIAGNOSIS — Z0000 Encounter for general adult medical examination without abnormal findings: Secondary | ICD-10-CM

## 2018-07-16 DIAGNOSIS — F329 Major depressive disorder, single episode, unspecified: Secondary | ICD-10-CM

## 2018-07-16 DIAGNOSIS — G360 Neuromyelitis optica [Devic]: Secondary | ICD-10-CM

## 2018-07-16 MED ORDER — CIPROFLOXACIN-DEXAMETHASONE 0.3-0.1 % OT SUSP: 4.0000 [drp] | Freq: Two times a day (BID) | OTIC | 0 refills | Status: DC

## 2018-07-16 MED ORDER — LORAZEPAM 0.5 MG PO TABS: 0.5000 mg | ORAL_TABLET | Freq: Two times a day (BID) | ORAL | 2 refills | Status: DC | PRN

## 2018-07-16 NOTE — Progress Notes (Signed)
Subjective:   Andrea Berry is a 52 y.o. female who presents for Medicare Annual (Subsequent) preventive examination.  Review of Systems:  n/a Cardiac Risk Factors include: hypertension;obesity (BMI >30kg/m2)     Objective:     Vitals: BP 132/88   Pulse 97   Ht 5\' 2"  (1.575 m)   Wt 194 lb 3.2 oz (88.1 kg)   BMI 35.52 kg/m   Body mass index is 35.52 kg/m.  Advanced Directives 07/16/2018 07/26/2017 05/04/2017 04/22/2017 02/23/2017 11/25/2015 10/27/2015  Does Patient Have a Medical Advance Directive? No No No No No No No  Would patient like information on creating a medical advance directive? No - Patient declined No - Patient declined - - - - -    Tobacco Social History   Tobacco Use  Smoking Status Never Smoker  Smokeless Tobacco Never Used     Counseling given: Not Answered   Clinical Intake:  Pre-visit preparation completed: Yes  Pain Score: 8  Pain Location: Arm Pain Orientation: Left     Nutritional Status: BMI > 30  Obese Diabetes: No  How often do you need to have someone help you when you read instructions, pamphlets, or other written materials from your doctor or pharmacy?: 5 - Always What is the last grade level you completed in school?: 12 th grade  Interpreter Needed?: No  Information entered by :: NAllen LPN  Past Medical History:  Diagnosis Date  . Arthritis   . Blind   . Depression   . Devic's disease ([place])   . GERD (gastroesophageal reflux disease)   . Headache(784.0)   . History of kidney stones   . Hypertension   . Lupus ([place])   . Shortness of breath    due to medication  . Stroke [facility]) 2008   visually impaired   Past Surgical History:  Procedure Laterality Date  . BREAST BIOPSY    . BREAST CYST EXCISION Left pt unsure  . COLONOSCOPY WITH PROPOFOL N/A 07/26/2017   Procedure: COLONOSCOPY WITH PROPOFOL;  Surgeon: Carol Ada, MD;  Location: [facility];  Service: Endoscopy;  Laterality: N/A;  . ESOPHAGOGASTRODUODENOSCOPY (EGD)  WITH PROPOFOL N/A 07/26/2017   Procedure: ESOPHAGOGASTRODUODENOSCOPY (EGD) WITH PROPOFOL;  Surgeon: Carol Ada, MD;  Location: [facility];  Service: Endoscopy;  Laterality: N/A;  . PORTA CATH INSERTION    . TUBAL LIGATION    . TUBAL LIGATION     Family History  Problem Relation Age of Onset  . Cancer Mother   . Cancer Father   . Lupus Sister    Social History   Socioeconomic History  . Marital status: Single    Spouse name: Not on file  . Number of children: Not on file  . Years of education: Not on file  . Highest education level: Not on file  Occupational History  . Occupation: disability  Social Needs  . Financial resource strain: Not hard at all  . Food insecurity:    Worry: Never true    Inability: Never true  . Transportation needs:    Medical: No    Non-medical: No  Tobacco Use  . Smoking status: Never Smoker  . Smokeless tobacco: Never Used  Substance and Sexual Activity  . Alcohol use: No  . Drug use: No  . Sexual activity: Not on file  Lifestyle  . Physical activity:    Days per week: 2 days    Minutes per session: 30 min  . Stress: Not at all  Relationships  .  Social connections:    Talks on phone: Not on file    Gets together: Not on file    Attends religious service: Not on file    Active member of club or organization: Not on file    Attends meetings of clubs or organizations: Not on file    Relationship status: Not on file  Other Topics Concern  . Not on file  Social History Narrative  . Not on file    Outpatient Encounter Medications as of 07/16/2018  Medication Sig  . alendronate (FOSAMAX) 70 MG tablet Take 70 mg by mouth once a week. Take with a full glass of water on an empty stomach.  Marland Kitchen azaTHIOprine (IMURAN) 50 MG tablet Take 50 mg by mouth 2 (two) times daily.   . ciprofloxacin-dexamethasone (CIPRODEX) OTIC suspension Place 4 drops into the right ear 2 (two) times daily.  Marland Kitchen esomeprazole (NEXIUM) 40 MG capsule TAKE 1 CAPSULE BY  MOUTH TWICE A DAY  . LINZESS 145 MCG CAPS capsule Take 145 mcg by mouth. ON AN EMPTY STOMACH AT LEAST 30 MINUTES BEFORE FIRST MEAL OF THE DAY  . LORazepam (ATIVAN) 0.5 MG tablet Take 1 tablet (0.5 mg total) by mouth 2 (two) times daily as needed for anxiety.  . predniSONE (DELTASONE) 10 MG tablet Take 10 mg daily by mouth.  . pregabalin (LYRICA) 200 MG capsule TAKE 1 CAPSULE BY MOUTH THREE TIMES A DAY  . RiTUXimab (RITUXAN IV) Inject 1,000 mg into the vein.  . rivaroxaban (XARELTO) 20 MG TABS tablet Take 20 mg by mouth daily.  . traMADol (ULTRAM) 50 MG tablet Take 1 tablet (50 mg total) by mouth 3 (three) times daily.  . [DISCONTINUED] LORazepam (ATIVAN) 0.5 MG tablet Take 0.5 mg by mouth daily.   No facility-administered encounter medications on file as of 07/16/2018.     Activities of Daily Living In your present state of health, do you have any difficulty performing the following activities: 07/16/2018 07/02/2018  Hearing? N N  Vision? Y N  Comment legally blind -  Difficulty concentrating or making decisions? N N  Walking or climbing stairs? Y Y  Comment due to stroke -  Dressing or bathing? Y N  Doing errands, shopping? Y -  Comment legally blind -  Preparing Food and eating ? Y -  Comment legally blind -  Using the Toilet? N -  In the past six months, have you accidently leaked urine? N -  Do you have problems with loss of bowel control? N -  Managing your Medications? Y -  Comment legally blind -  Managing your Finances? Y -  Comment legally blind -  Housekeeping or managing your Housekeeping? Y -  Comment legally blind -  Some recent data might be hidden    Patient Care Team: Minette Brine, FNP as PCP - General (General Practice)    Assessment:   This is a routine wellness examination for Andrea Berry.  Exercise Activities and Dietary recommendations Current Exercise Habits: Home exercise routine, Type of exercise: walking;stretching, Time (Minutes): 30, Frequency  (Times/Week): 2, Weekly Exercise (Minutes/Week): 60, Intensity: Moderate, Exercise limited by: None identified  Goals    . Exercise 150 min/wk Moderate Activity       Fall Risk Fall Risk  07/16/2018 07/16/2018 09/12/2017 12/20/2015 12/06/2015  Falls in the past year? 0 0 No No No  Risk for fall due to : Impaired vision;Medication side effect;Impaired mobility - Other (Comment) - -  Risk for fall due to: Comment - -  legally blind - -   Is the patient's home free of loose throw rugs in walkways, pet beds, electrical cords, etc?   yes      Grab bars in the bathroom? yes      Handrails on the stairs?   yes      Adequate lighting?   yes  Timed Get Up and Go performed: n/a  Depression Screen PHQ 2/9 Scores 07/16/2018 07/16/2018 09/12/2017  PHQ - 2 Score 2 2 4   PHQ- 9 Score 7 6 10      Cognitive Function     6CIT Screen 07/16/2018  What Year? 0 points  What month? 0 points  What time? 0 points  Count back from 20 0 points  Months in reverse 0 points  Repeat phrase 0 points  Total Score 0    Immunization History  Administered Date(s) Administered  . Influenza,inj,Quad PF,6+ Mos 09/24/2015, 07/16/2018  . Influenza-Unspecified 07/10/2017  . Tdap 07/16/2018    Qualifies for Shingles Vaccine? yes  Screening Tests Health Maintenance  Topic Date Due  . MAMMOGRAM  05/22/2019  . PAP SMEAR  04/30/2020  . COLONOSCOPY  07/27/2027  . TETANUS/TDAP  07/16/2028  . INFLUENZA VACCINE  Completed  . HIV Screening  Completed    Cancer Screenings: Lung: Low Dose CT Chest recommended if Age 106-80 years, 30 pack-year currently smoking OR have quit w/in 15years. Patient does not qualify. Breast:  Up to date on Mammogram? Yes   Up to date of Bone Density/Dexa? Not due yet Colorectal: up to date  Additional Screenings: : Hepatitis C Screening: n/a     Plan:    Patient states she wants to start exercising more. She received TDAP and flu vaccine today.   I have personally reviewed and noted  the following in the patient's chart:   . Medical and social history . Use of alcohol, tobacco or illicit drugs  . Current medications and supplements . Functional ability and status . Nutritional status . Physical activity . Advanced directives . List of other physicians . Hospitalizations, surgeries, and ER visits in previous 12 months . Vitals . Screenings to include cognitive, depression, and falls . Referrals and appointments  In addition, I have reviewed and discussed with patient certain preventive protocols, quality metrics, and best practice recommendations. A written personalized care plan for preventive services as well as general preventive health recommendations were provided to patient.     Kellie Simmering, LPN  31/01/1760

## 2018-07-16 NOTE — Progress Notes (Signed)
Subjective:  Patient ID: Andrea Berry , female    DOB: [birth year] , 52 y.o.   MRN: [id number]   Chief Complaint  Patient presents with  . Hypertension    HPI  Hypertension  This is a chronic problem. The current episode started more than 1 year ago. The problem is unchanged. Associated symptoms include anxiety. There are no associated agents to hypertension.     Past Medical History:  Diagnosis Date  . Arthritis   . Blind   . Depression   . Devic's disease ([place])   . GERD (gastroesophageal reflux disease)   . Headache(784.0)   . History of kidney stones   . Hypertension   . Lupus ([place])   . Shortness of breath    due to medication  . Stroke [facility]) 2008   visually impaired     Family History  Problem Relation Age of Onset  . Cancer Mother   . Cancer Father   . Lupus Sister      Current Outpatient Medications:  .  alendronate (FOSAMAX) 70 MG tablet, Take 70 mg by mouth once a week. Take with a full glass of water on an empty stomach., Disp: , Rfl:  .  esomeprazole (NEXIUM) 40 MG capsule, TAKE 1 CAPSULE BY MOUTH TWICE A DAY, Disp: 180 capsule, Rfl: 1 .  LINZESS 145 MCG CAPS capsule, Take 145 mcg by mouth. ON AN EMPTY STOMACH AT LEAST 30 MINUTES BEFORE FIRST MEAL OF THE DAY, Disp: , Rfl: 1 .  LORazepam (ATIVAN) 0.5 MG tablet, Take 0.5 mg by mouth daily., Disp: , Rfl:  .  predniSONE (DELTASONE) 10 MG tablet, Take 10 mg daily by mouth., Disp: , Rfl:  .  pregabalin (LYRICA) 200 MG capsule, TAKE 1 CAPSULE BY MOUTH THREE TIMES A DAY, Disp: 90 capsule, Rfl: 0 .  rivaroxaban (XARELTO) 20 MG TABS tablet, Take 20 mg by mouth daily., Disp: , Rfl:  .  traMADol (ULTRAM) 50 MG tablet, Take 1 tablet (50 mg total) by mouth 3 (three) times daily., Disp: 90 tablet, Rfl: 5 .  azaTHIOprine (IMURAN) 50 MG tablet, Take 50 mg by mouth 2 (two) times daily. , Disp: , Rfl:  .  RiTUXimab (RITUXAN IV), Inject 1,000 mg into the vein., Disp: , Rfl:    No Known Allergies   Review of Systems   Constitutional: Negative.   HENT: Negative.   Eyes: Negative.        Legally blind  Respiratory: Negative.   Cardiovascular: Negative.   Gastrointestinal: Negative.   Genitourinary: Negative.   Musculoskeletal: Negative.   Hematological: Negative.      Today's Vitals   07/16/18 1118  BP: 132/88  Pulse: 97  Temp: 98.3 F (36.8 C)  TempSrc: Oral  SpO2: 90%  Weight: 194 lb 3.2 oz (88.1 kg)  Height: 5' 1.5" (1.562 m)  PainSc: 8    Body mass index is 36.1 kg/m.   Objective:  Physical Exam  Constitutional: She is oriented to person, place, and time. She appears well-developed and well-nourished.  HENT:  Head: Normocephalic and atraumatic.  Right Ear: External ear normal.  Left Ear: External ear normal.  Nose: Nose normal.  Mouth/Throat: Oropharynx is clear and moist.  Eyes: Pupils are equal, round, and reactive to light. Conjunctivae and EOM are normal.  Neck: Normal range of motion. Neck supple.  Cardiovascular: Normal rate, regular rhythm and normal heart sounds.  Pulmonary/Chest: Effort normal and breath sounds normal.  Abdominal: Soft. Bowel sounds are normal.  Musculoskeletal: Normal range of motion.  Neurological: She is alert and oriented to person, place, and time.  Skin: Skin is warm and dry. Capillary refill takes less than 2 seconds.  Psychiatric: She has a normal mood and affect.        Assessment And Plan:     1. Essential hypertension  Chronic, controlled  Continue with current medications - EKG 12-Lead - CMP14 + Anion Gap - Lipid Profile - CBC  2. Lupus cerebritis (HCC)  Stable - CBC  3. Depression, unspecified depression type  I had referred her to psychiatry she says she has not heard from them.   Given number to psychiatrist to call referral placed in September  Continue current medications 4. Anxiety  See # 3,   Will provide as needed lorazepam - LORazepam (ATIVAN) 0.5 MG tablet; Take 1 tablet (0.5 mg total) by mouth 2 (two)  times daily as needed for anxiety.  Dispense: 30 tablet; Refill: 2  5. Devic's disease ([place])  Continue follow up with [facility] for your infusions monthly  Has portacath in place and has flushed monthly at [facility]. Acute otitis media, unspecified otitis media type  Right ear erythematous and TM cloudy  Will treat with antibiotic ear drop - ciprofloxacin-dexamethasone (CIPRODEX) OTIC suspension; Place 4 drops into the right ear 2 (two) times daily.  Dispense: 7.5 mL; Refill: 0  7. Need for influenza vaccination  Influenza vaccine administered  Encouraged to take Tylenol as needed for fever or muscle aches. - Flu Vaccine QUAD 6+ mos PF IM (Fluarix Quad PF)  8. Encounter for immunization  - Tdap vaccine greater than or equal to 7yo IM   Minette Brine, FNP

## 2018-07-16 NOTE — Patient Instructions (Addendum)
Andrea Berry , Thank you for taking time to come for your Medicare Wellness Visit. I appreciate your ongoing commitment to your health goals. Please review the following plan we discussed and let me know if I can assist you in the future.   Screening recommendations/referrals: Colonoscopy: 06/2017 Mammogram: due Bone Density: not yet Recommended yearly ophthalmology/optometry visit for glaucoma screening and checkup Recommended yearly dental visit for hygiene and checkup  Vaccinations: Influenza vaccine: today Pneumococcal vaccine: not yet Tdap vaccine: today Shingles vaccine: dec    Advanced directives: Advance directive discussed with you today. Even though you declined this today please call our office should you change your mind and we can give you the proper paperwork for you to fill out.   Conditions/risks identified: Obesity: Patient states she exercises 2 days a week. She reports that she would like to increase her exercising.  Next appointment: 01/14/2019 at 11:30a  Preventive Care 40-64 Years, Female Preventive care refers to lifestyle choices and visits with your health care provider that can promote health and wellness. What does preventive care include?  A yearly physical exam. This is also called an annual well check.  Dental exams once or twice a year.  Routine eye exams. Ask your health care provider how often you should have your eyes checked.  Personal lifestyle choices, including:  Daily care of your teeth and gums.  Regular physical activity.  Eating a healthy diet.  Avoiding tobacco and drug use.  Limiting alcohol use.  Practicing safe sex.  Taking low-dose aspirin daily starting at age 43.  Taking vitamin and mineral supplements as recommended by your health care provider. What happens during an annual well check? The services and screenings done by your health care provider during your annual well check will depend on your age, overall health,  lifestyle risk factors, and family history of disease. Counseling  Your health care provider may ask you questions about your:  Alcohol use.  Tobacco use.  Drug use.  Emotional well-being.  Home and relationship well-being.  Sexual activity.  Eating habits.  Work and work Statistician.  Method of birth control.  Menstrual cycle.  Pregnancy history. Screening  You may have the following tests or measurements:  Height, weight, and BMI.  Blood pressure.  Lipid and cholesterol levels. These may be checked every 5 years, or more frequently if you are over 67 years old.  Skin check.  Lung cancer screening. You may have this screening every year starting at age 58 if you have a 30-pack-year history of smoking and currently smoke or have quit within the past 15 years.  Fecal occult blood test (FOBT) of the stool. You may have this test every year starting at age 69.  Flexible sigmoidoscopy or colonoscopy. You may have a sigmoidoscopy every 5 years or a colonoscopy every 10 years starting at age 74.  Hepatitis C blood test.  Hepatitis B blood test.  Sexually transmitted disease (STD) testing.  Diabetes screening. This is done by checking your blood sugar (glucose) after you have not eaten for a while (fasting). You may have this done every 1-3 years.  Mammogram. This may be done every 1-2 years. Talk to your health care provider about when you should start having regular mammograms. This may depend on whether you have a family history of breast cancer.  BRCA-related cancer screening. This may be done if you have a family history of breast, ovarian, tubal, or peritoneal cancers.  Pelvic exam and Pap test. This may  be done every 3 years starting at age 17. Starting at age 66, this may be done every 5 years if you have a Pap test in combination with an HPV test.  Bone density scan. This is done to screen for osteoporosis. You may have this scan if you are at high risk for  osteoporosis. Discuss your test results, treatment options, and if necessary, the need for more tests with your health care provider. Vaccines  Your health care provider may recommend certain vaccines, such as:  Influenza vaccine. This is recommended every year.  Tetanus, diphtheria, and acellular pertussis (Tdap, Td) vaccine. You may need a Td booster every 10 years.  Zoster vaccine. You may need this after age 51.  Pneumococcal 13-valent conjugate (PCV13) vaccine. You may need this if you have certain conditions and were not previously vaccinated.  Pneumococcal polysaccharide (PPSV23) vaccine. You may need one or two doses if you smoke cigarettes or if you have certain conditions. Talk to your health care provider about which screenings and vaccines you need and how often you need them. This information is not intended to replace advice given to you by your health care provider. Make sure you discuss any questions you have with your health care provider. Document Released: 09/23/2015 Document Revised: 05/16/2016 Document Reviewed: 06/28/2015 Elsevier Interactive Patient Education  2017 [place] Prevention in the Home Falls can cause injuries. They can happen to people of all ages. There are many things you can do to make your home safe and to help prevent falls. What can I do on the outside of my home?  Regularly fix the edges of walkways and driveways and fix any cracks.  Remove anything that might make you trip as you walk through a door, such as a raised step or threshold.  Trim any bushes or trees on the path to your home.  Use bright outdoor lighting.  Clear any walking paths of anything that might make someone trip, such as rocks or tools.  Regularly check to see if handrails are loose or broken. Make sure that both sides of any steps have handrails.  Any raised decks and porches should have guardrails on the edges.  Have any leaves, snow, or ice cleared  regularly.  Use sand or salt on walking paths during winter.  Clean up any spills in your garage right away. This includes oil or grease spills. What can I do in the bathroom?  Use night lights.  Install grab bars by the toilet and in the tub and shower. Do not use towel bars as grab bars.  Use non-skid mats or decals in the tub or shower.  If you need to sit down in the shower, use a plastic, non-slip stool.  Keep the floor dry. Clean up any water that spills on the floor as soon as it happens.  Remove soap buildup in the tub or shower regularly.  Attach bath mats securely with double-sided non-slip rug tape.  Do not have throw rugs and other things on the floor that can make you trip. What can I do in the bedroom?  Use night lights.  Make sure that you have a light by your bed that is easy to reach.  Do not use any sheets or blankets that are too big for your bed. They should not hang down onto the floor.  Have a firm chair that has side arms. You can use this for support while you get dressed.  Do  not have throw rugs and other things on the floor that can make you trip. What can I do in the kitchen?  Clean up any spills right away.  Avoid walking on wet floors.  Keep items that you use a lot in easy-to-reach places.  If you need to reach something above you, use a strong step stool that has a grab bar.  Keep electrical cords out of the way.  Do not use floor polish or wax that makes floors slippery. If you must use wax, use non-skid floor wax.  Do not have throw rugs and other things on the floor that can make you trip. What can I do with my stairs?  Do not leave any items on the stairs.  Make sure that there are handrails on both sides of the stairs and use them. Fix handrails that are broken or loose. Make sure that handrails are as long as the stairways.  Check any carpeting to make sure that it is firmly attached to the stairs. Fix any carpet that is loose  or worn.  Avoid having throw rugs at the top or bottom of the stairs. If you do have throw rugs, attach them to the floor with carpet tape.  Make sure that you have a light switch at the top of the stairs and the bottom of the stairs. If you do not have them, ask someone to add them for you. What else can I do to help prevent falls?  Wear shoes that:  Do not have high heels.  Have rubber bottoms.  Are comfortable and fit you well.  Are closed at the toe. Do not wear sandals.  If you use a stepladder:  Make sure that it is fully opened. Do not climb a closed stepladder.  Make sure that both sides of the stepladder are locked into place.  Ask someone to hold it for you, if possible.  Clearly mark and make sure that you can see:  Any grab bars or handrails.  First and last steps.  Where the edge of each step is.  Use tools that help you move around (mobility aids) if they are needed. These include:  Canes.  Walkers.  Scooters.  Crutches.  Turn on the lights when you go into a dark area. Replace any light bulbs as soon as they burn out.  Set up your furniture so you have a clear path. Avoid moving your furniture around.  If any of your floors are uneven, fix them.  If there are any pets around you, be aware of where they are.  Review your medicines with your doctor. Some medicines can make you feel dizzy. This can increase your chance of falling. Ask your doctor what other things that you can do to help prevent falls. This information is not intended to replace advice given to you by your health care provider. Make sure you discuss any questions you have with your health care provider. Document Released: 06/23/2009 Document Revised: 02/02/2016 Document Reviewed: 10/01/2014 Elsevier Interactive Patient Education  2017 [employer].

## 2018-07-16 NOTE — Patient Instructions (Addendum)
Influenza, Adult Influenza ("the flu") is an infection in the lungs, nose, and throat (respiratory tract). It is caused by a virus. The flu causes many common cold symptoms, as well as a high fever and body aches. It can make you feel very sick. The flu spreads easily from person to person (is contagious). Getting a flu shot (influenza vaccination) every year is the best way to prevent the flu. Follow these instructions at home:  Take over-the-counter and prescription medicines only as told by your doctor.  Use a cool mist humidifier to add moisture (humidity) to the air in your home. This can make it easier to breathe.  Rest as needed.  Drink enough fluid to keep your pee (urine) clear or pale yellow.  Cover your mouth and nose when you cough or sneeze.  Wash your hands with soap and water often, especially after you cough or sneeze. If you cannot use soap and water, use hand sanitizer.  Stay home from work or school as told by your doctor. Unless you are visiting your doctor, try to avoid leaving home until your fever has been gone for 24 hours without the use of medicine.  Keep all follow-up visits as told by your doctor. This is important. How is this prevented?  Getting a yearly (annual) flu shot is the best way to avoid getting the flu. You may get the flu shot in late summer, fall, or winter. Ask your doctor when you should get your flu shot.  Wash your hands often or use hand sanitizer often.  Avoid contact with people who are sick during cold and flu season.  Eat healthy foods.  Drink plenty of fluids.  Get enough sleep.  Exercise regularly. Contact a doctor if:  You get new symptoms.  You have: ? Chest pain. ? Watery poop (diarrhea). ? A fever.  Your cough gets worse.  You start to have more mucus.  You feel sick to your stomach (nauseous).  You throw up (vomit). Get help right away if:  You start to be short of breath or have trouble breathing.  Your  skin or nails turn a bluish color.  You have very bad pain or stiffness in your neck.  You get a sudden headache.  You get sudden pain in your face or ear.  You cannot stop throwing up. This information is not intended to replace advice given to you by your health care provider. Make sure you discuss any questions you have with your health care provider. Document Released: 06/05/2008 Document Revised: 02/02/2016 Document Reviewed: 06/21/2015 Elsevier Interactive Patient Education  2017 [place] Maintenance, Female Adopting a healthy lifestyle and getting preventive care can go a long way to promote health and wellness. Talk with your health care provider about what schedule of regular examinations is right for you. This is a good chance for you to check in with your provider about disease prevention and staying healthy. In between checkups, there are plenty of things you can do on your own. Experts have done a lot of research about which lifestyle changes and preventive measures are most likely to keep you healthy. Ask your health care provider for more information. Weight and diet Eat a healthy diet  Be sure to include plenty of vegetables, fruits, low-fat dairy products, and lean protein.  Do not eat a lot of foods high in solid fats, added sugars, or salt.  Get regular exercise. This is one of the most important things you can do  for your health. ? Most adults should exercise for at least 150 minutes each week. The exercise should increase your heart rate and make you sweat (moderate-intensity exercise). ? Most adults should also do strengthening exercises at least twice a week. This is in addition to the moderate-intensity exercise.  Maintain a healthy weight  Body mass index (BMI) is a measurement that can be used to identify possible weight problems. It estimates body fat based on height and weight. Your health care provider can help determine your BMI and help you  achieve or maintain a healthy weight.  For females 39 years of age and older: ? A BMI below 18.5 is considered underweight. ? A BMI of 18.5 to 24.9 is normal. ? A BMI of 25 to 29.9 is considered overweight. ? A BMI of 30 and above is considered obese.  Watch levels of cholesterol and blood lipids  You should start having your blood tested for lipids and cholesterol at 50 years of age, then have this test every 5 years.  You may need to have your cholesterol levels checked more often if: ? Your lipid or cholesterol levels are high. ? You are older than 50 years of age. ? You are at high risk for heart disease.  Cancer screening Lung Cancer  Lung cancer screening is recommended for adults 29-12 years old who are at high risk for lung cancer because of a history of smoking.  A yearly low-dose CT scan of the lungs is recommended for people who: ? Currently smoke. ? Have quit within the past 15 years. ? Have at least a 30-pack-year history of smoking. A pack year is smoking an average of one pack of cigarettes a day for 1 year.  Yearly screening should continue until it has been 15 years since you quit.  Yearly screening should stop if you develop a health problem that would prevent you from having lung cancer treatment.  Breast Cancer  Practice breast self-awareness. This means understanding how your breasts normally appear and feel.  It also means doing regular breast self-exams. Let your health care provider know about any changes, no matter how small.  If you are in your 20s or 30s, you should have a clinical breast exam (CBE) by a health care provider every 1-3 years as part of a regular health exam.  If you are 50 or older, have a CBE every year. Also consider having a breast X-ray (mammogram) every year.  If you have a family history of breast cancer, talk to your health care provider about genetic screening.  If you are at high risk for breast cancer, talk to your health  care provider about having an MRI and a mammogram every year.  Breast cancer gene (BRCA) assessment is recommended for women who have family members with BRCA-related cancers. BRCA-related cancers include: ? Breast. ? Ovarian. ? Tubal. ? Peritoneal cancers.  Results of the assessment will determine the need for genetic counseling and BRCA1 and BRCA2 testing.  Cervical Cancer Your health care provider may recommend that you be screened regularly for cancer of the pelvic organs (ovaries, uterus, and vagina). This screening involves a pelvic examination, including checking for microscopic changes to the surface of your cervix (Pap test). You may be encouraged to have this screening done every 3 years, beginning at age 44.  For women ages 77-65, health care providers may recommend pelvic exams and Pap testing every 3 years, or they may recommend the Pap and pelvic exam, combined  with testing for human papilloma virus (HPV), every 5 years. Some types of HPV increase your risk of cervical cancer. Testing for HPV may also be done on women of any age with unclear Pap test results.  Other health care providers may not recommend any screening for nonpregnant women who are considered low risk for pelvic cancer and who do not have symptoms. Ask your health care provider if a screening pelvic exam is right for you.  If you have had past treatment for cervical cancer or a condition that could lead to cancer, you need Pap tests and screening for cancer for at least 20 years after your treatment. If Pap tests have been discontinued, your risk factors (such as having a new sexual partner) need to be reassessed to determine if screening should resume. Some women have medical problems that increase the chance of getting cervical cancer. In these cases, your health care provider may recommend more frequent screening and Pap tests.  Colorectal Cancer  This type of cancer can be detected and often  prevented.  Routine colorectal cancer screening usually begins at 50 years of age and continues through 50 years of age.  Your health care provider may recommend screening at an earlier age if you have risk factors for colon cancer.  Your health care provider may also recommend using home test kits to check for hidden blood in the stool.  A small camera at the end of a tube can be used to examine your colon directly (sigmoidoscopy or colonoscopy). This is done to check for the earliest forms of colorectal cancer.  Routine screening usually begins at age 54.  Direct examination of the colon should be repeated every 5-10 years through 50 years of age. However, you may need to be screened more often if early forms of precancerous polyps or small growths are found.  Skin Cancer  Check your skin from head to toe regularly.  Tell your health care provider about any new moles or changes in moles, especially if there is a change in a mole's shape or color.  Also tell your health care provider if you have a mole that is larger than the size of a pencil eraser.  Always use sunscreen. Apply sunscreen liberally and repeatedly throughout the day.  Protect yourself by wearing long sleeves, pants, a wide-brimmed hat, and sunglasses whenever you are outside.  Heart disease, diabetes, and high blood pressure  High blood pressure causes heart disease and increases the risk of stroke. High blood pressure is more likely to develop in: ? People who have blood pressure in the high end of the normal range (130-139/85-89 mm Hg). ? People who are overweight or obese. ? People who are African American.  If you are 56-29 years of age, have your blood pressure checked every 3-5 years. If you are 90 years of age or older, have your blood pressure checked every year. You should have your blood pressure measured twice-once when you are at a hospital or clinic, and once when you are not at a hospital or clinic.  Record the average of the two measurements. To check your blood pressure when you are not at a hospital or clinic, you can use: ? An automated blood pressure machine at a pharmacy. ? A home blood pressure monitor.  If you are between 46 years and 79 years old, ask your health care provider if you should take aspirin to prevent strokes.  Have regular diabetes screenings. This involves taking a blood  sample to check your fasting blood sugar level. ? If you are at a normal weight and have a low risk for diabetes, have this test once every three years after 50 years of age. ? If you are overweight and have a high risk for diabetes, consider being tested at a younger age or more often. Preventing infection Hepatitis B  If you have a higher risk for hepatitis B, you should be screened for this virus. You are considered at high risk for hepatitis B if: ? You were born in a country where hepatitis B is common. Ask your health care provider which countries are considered high risk. ? Your parents were born in a high-risk country, and you have not been immunized against hepatitis B (hepatitis B vaccine). ? You have HIV or AIDS. ? You use needles to inject street drugs. ? You live with someone who has hepatitis B. ? You have had sex with someone who has hepatitis B. ? You get hemodialysis treatment. ? You take certain medicines for conditions, including cancer, organ transplantation, and autoimmune conditions.  Hepatitis C  Blood testing is recommended for: ? Everyone born from [birth year] through 1963. ? Anyone with known risk factors for hepatitis C.  Sexually transmitted infections (STIs)  You should be screened for sexually transmitted infections (STIs) including gonorrhea and chlamydia if: ? You are sexually active and are younger than 50 years of age. ? You are older than 50 years of age and your health care provider tells you that you are at risk for this type of infection. ? Your sexual  activity has changed since you were last screened and you are at an increased risk for chlamydia or gonorrhea. Ask your health care provider if you are at risk.  If you do not have HIV, but are at risk, it may be recommended that you take a prescription medicine daily to prevent HIV infection. This is called pre-exposure prophylaxis (PrEP). You are considered at risk if: ? You are sexually active and do not regularly use condoms or know the HIV status of your partner(s). ? You take drugs by injection. ? You are sexually active with a partner who has HIV.  Talk with your health care provider about whether you are at high risk of being infected with HIV. If you choose to begin PrEP, you should first be tested for HIV. You should then be tested every 3 months for as long as you are taking PrEP. Pregnancy  If you are premenopausal and you may become pregnant, ask your health care provider about preconception counseling.  If you may become pregnant, take 400 to 800 micrograms (mcg) of folic acid every day.  If you want to prevent pregnancy, talk to your health care provider about birth control (contraception). Osteoporosis and menopause  Osteoporosis is a disease in which the bones lose minerals and strength with aging. This can result in serious bone fractures. Your risk for osteoporosis can be identified using a bone density scan.  If you are 62 years of age or older, or if you are at risk for osteoporosis and fractures, ask your health care provider if you should be screened.  Ask your health care provider whether you should take a calcium or vitamin D supplement to lower your risk for osteoporosis.  Menopause may have certain physical symptoms and risks.  Hormone replacement therapy may reduce some of these symptoms and risks. Talk to your health care provider about whether hormone replacement therapy is right  for you. Follow these instructions at home:  Schedule regular health, dental,  and eye exams.  Stay current with your immunizations.  Do not use any tobacco products including cigarettes, chewing tobacco, or electronic cigarettes.  If you are pregnant, do not drink alcohol.  If you are breastfeeding, limit how much and how often you drink alcohol.  Limit alcohol intake to no more than 1 drink per day for nonpregnant women. One drink equals 12 ounces of beer, 5 ounces of wine, or 1 ounces of hard liquor.  Do not use street drugs.  Do not share needles.  Ask your health care provider for help if you need support or information about quitting drugs.  Tell your health care provider if you often feel depressed.  Tell your health care provider if you have ever been abused or do not feel safe at home. This information is not intended to replace advice given to you by your health care provider. Make sure you discuss any questions you have with your health care provider. Document Released: 03/12/2011 Document Revised: 02/02/2016 Document Reviewed: 05/31/2015 Elsevier Interactive Patient Education  [employer].

## 2018-07-17 LAB — CBC
HEMATOCRIT: 41.4 % (ref 34.0–46.6)
HEMOGLOBIN: 12.5 g/dL (ref 11.1–15.9)
MCH: 22.6 pg — ABNORMAL LOW (ref 26.6–33.0)
MCHC: 30.2 g/dL — AB (ref 31.5–35.7)
MCV: 75 fL — ABNORMAL LOW (ref 79–97)
Platelets: 330 10*3/uL (ref 150–450)
RBC: 5.53 x10E6/uL — ABNORMAL HIGH (ref 3.77–5.28)
RDW: 14.8 % (ref 12.3–15.4)
WBC: 6.2 10*3/uL (ref 3.4–10.8)

## 2018-07-17 LAB — CMP14 + ANION GAP
A/G RATIO: 1.7 (ref 1.2–2.2)
ALT: 13 IU/L (ref 0–32)
AST: 26 IU/L (ref 0–40)
Albumin: 4.7 g/dL (ref 3.5–5.5)
Alkaline Phosphatase: 66 IU/L (ref 39–117)
Anion Gap: 21 mmol/L — ABNORMAL HIGH (ref 10.0–18.0)
BUN/Creatinine Ratio: 16 (ref 9–23)
BUN: 15 mg/dL (ref 6–24)
CALCIUM: 10.1 mg/dL (ref 8.7–10.2)
CO2: 16 mmol/L — ABNORMAL LOW (ref 20–29)
Chloride: 107 mmol/L — ABNORMAL HIGH (ref 96–106)
Creatinine, Ser: 0.95 mg/dL (ref 0.57–1.00)
GFR calc Af Amer: 81 mL/min/{1.73_m2} (ref >59)
GFR, EST NON AFRICAN AMERICAN: 70 mL/min/{1.73_m2} (ref >59)
GLOBULIN, TOTAL: 2.7 g/dL (ref 1.5–4.5)
Glucose: 87 mg/dL (ref 65–99)
POTASSIUM: 4.2 mmol/L (ref 3.5–5.2)
SODIUM: 144 mmol/L (ref 134–144)
Total Protein: 7.4 g/dL (ref 6.0–8.5)

## 2018-07-17 LAB — LIPID PANEL
CHOL/HDL RATIO: 3.2 ratio (ref 0.0–4.4)
Cholesterol, Total: 219 mg/dL — ABNORMAL HIGH (ref 100–199)
HDL: 69 mg/dL (ref >39)
LDL Calculated: 122 mg/dL — ABNORMAL HIGH (ref 0–99)
TRIGLYCERIDES: 138 mg/dL (ref 0–149)
VLDL Cholesterol Cal: 28 mg/dL (ref 5–40)

## 2018-07-22 ENCOUNTER — Other Ambulatory Visit: Payer: Self-pay | Admitting: Nurse Practitioner

## 2018-07-30 ENCOUNTER — Other Ambulatory Visit: Payer: Self-pay | Admitting: Nurse Practitioner

## 2018-08-06 ENCOUNTER — Ambulatory Visit (HOSPITAL_COMMUNITY): Payer: Medicare Other

## 2018-08-14 ENCOUNTER — Ambulatory Visit (HOSPITAL_COMMUNITY): Payer: Medicare Other

## 2018-08-20 ENCOUNTER — Other Ambulatory Visit: Payer: Self-pay | Admitting: Nurse Practitioner

## 2018-08-20 ENCOUNTER — Telehealth: Payer: Self-pay

## 2018-08-20 DIAGNOSIS — H6690 Otitis media, unspecified, unspecified ear: Secondary | ICD-10-CM

## 2018-08-20 NOTE — Telephone Encounter (Signed)
Patient called requesting a referral for ENT due to her ear not getting any better and feels like it is draining. YRL,RMA

## 2018-08-21 ENCOUNTER — Encounter (HOSPITAL_COMMUNITY): Payer: Self-pay

## 2018-08-21 ENCOUNTER — Encounter (HOSPITAL_COMMUNITY)
Admission: RE | Admit: 2018-08-21 | Discharge: 2018-08-21 | Disposition: A | Discharge status: Home / Self Care | Payer: Medicare Other | Source: Ambulatory Visit | Attending: Neurology | Admitting: Neurology

## 2018-08-21 DIAGNOSIS — Z451 Encounter for adjustment and management of infusion pump: Secondary | ICD-10-CM | POA: Insufficient documentation

## 2018-08-21 DIAGNOSIS — G35 Multiple sclerosis: Secondary | ICD-10-CM | POA: Diagnosis not present

## 2018-08-21 MED ORDER — HEPARIN SOD (PORK) LOCK FLUSH 100 UNIT/ML IV SOLN
500.0000 [IU] | INTRAVENOUS | Status: AC
  Administered 2018-08-21: 500 [IU] via INTRAVENOUS
  Filled 2018-08-21: qty 5

## 2018-08-21 MED ORDER — SODIUM CHLORIDE 0.9% FLUSH
10.0000 mL | INTRAVENOUS | Status: AC
  Administered 2018-08-21: 10 mL via INTRAVENOUS

## 2018-08-24 ENCOUNTER — Other Ambulatory Visit: Payer: Self-pay | Admitting: Nurse Practitioner

## 2018-08-26 ENCOUNTER — Other Ambulatory Visit: Payer: Self-pay | Admitting: Nurse Practitioner

## 2018-08-27 ENCOUNTER — Other Ambulatory Visit: Payer: Self-pay | Admitting: Nurse Practitioner

## 2018-09-09 ENCOUNTER — Ambulatory Visit (HOSPITAL_COMMUNITY): Payer: Medicare Other

## 2018-09-15 ENCOUNTER — Ambulatory Visit (HOSPITAL_COMMUNITY): Payer: Medicare Other

## 2018-09-18 ENCOUNTER — Encounter (HOSPITAL_COMMUNITY): Payer: Medicare Other

## 2018-09-18 DIAGNOSIS — H6993 Unspecified Eustachian tube disorder, bilateral: Secondary | ICD-10-CM | POA: Insufficient documentation

## 2018-09-18 DIAGNOSIS — H6983 Other specified disorders of Eustachian tube, bilateral: Secondary | ICD-10-CM

## 2018-09-18 HISTORY — DX: Other specified disorders of eustachian tube, bilateral: H69.83

## 2018-09-18 HISTORY — DX: Unspecified eustachian tube disorder, bilateral: H69.93

## 2018-09-23 ENCOUNTER — Other Ambulatory Visit: Payer: Self-pay | Admitting: Nurse Practitioner

## 2018-10-02 ENCOUNTER — Ambulatory Visit (INDEPENDENT_AMBULATORY_CARE_PROVIDER_SITE_OTHER): Payer: Medicare Other | Admitting: Family Medicine

## 2018-10-02 ENCOUNTER — Other Ambulatory Visit: Payer: Self-pay

## 2018-10-02 ENCOUNTER — Ambulatory Visit: Payer: Medicare Other | Attending: Family Medicine | Admitting: Physical Therapy

## 2018-10-02 ENCOUNTER — Ambulatory Visit (INDEPENDENT_AMBULATORY_CARE_PROVIDER_SITE_OTHER): Payer: Medicare Other | Admitting: Licensed Clinical Social Worker

## 2018-10-02 VITALS — BP 126/84 | Temp 98.4°F | Ht 62.0 in | Wt 200.0 lb

## 2018-10-02 DIAGNOSIS — R2689 Other abnormalities of gait and mobility: Secondary | ICD-10-CM

## 2018-10-02 DIAGNOSIS — G3184 Mild cognitive impairment, so stated: Secondary | ICD-10-CM | POA: Diagnosis not present

## 2018-10-02 DIAGNOSIS — G360 Neuromyelitis optica [Devic]: Secondary | ICD-10-CM

## 2018-10-02 DIAGNOSIS — R269 Unspecified abnormalities of gait and mobility: Secondary | ICD-10-CM | POA: Diagnosis not present

## 2018-10-02 DIAGNOSIS — D6862 Lupus anticoagulant syndrome: Secondary | ICD-10-CM

## 2018-10-02 DIAGNOSIS — Z7952 Long term (current) use of systemic steroids: Secondary | ICD-10-CM

## 2018-10-02 DIAGNOSIS — Z9225 Personal history of immunosupression therapy: Secondary | ICD-10-CM

## 2018-10-02 DIAGNOSIS — M6281 Muscle weakness (generalized): Secondary | ICD-10-CM

## 2018-10-02 DIAGNOSIS — R42 Dizziness and giddiness: Secondary | ICD-10-CM

## 2018-10-02 DIAGNOSIS — Z8619 Personal history of other infectious and parasitic diseases: Secondary | ICD-10-CM

## 2018-10-02 DIAGNOSIS — M3219 Other organ or system involvement in systemic lupus erythematosus: Secondary | ICD-10-CM

## 2018-10-02 DIAGNOSIS — F418 Other specified anxiety disorders: Secondary | ICD-10-CM

## 2018-10-02 DIAGNOSIS — M792 Neuralgia and neuritis, unspecified: Secondary | ICD-10-CM

## 2018-10-02 DIAGNOSIS — Z299 Encounter for prophylactic measures, unspecified: Secondary | ICD-10-CM

## 2018-10-02 DIAGNOSIS — Z7189 Other specified counseling: Secondary | ICD-10-CM

## 2018-10-02 DIAGNOSIS — G053 Encephalitis and encephalomyelitis in diseases classified elsewhere: Secondary | ICD-10-CM

## 2018-10-02 DIAGNOSIS — R531 Weakness: Secondary | ICD-10-CM

## 2018-10-02 DIAGNOSIS — F3289 Other specified depressive episodes: Secondary | ICD-10-CM

## 2018-10-02 DIAGNOSIS — R270 Ataxia, unspecified: Secondary | ICD-10-CM

## 2018-10-02 DIAGNOSIS — G8929 Other chronic pain: Secondary | ICD-10-CM

## 2018-10-02 HISTORY — DX: Other chronic pain: G89.29

## 2018-10-02 HISTORY — DX: Lupus anticoagulant syndrome: D68.62

## 2018-10-02 MED ORDER — ZOSTER VAC RECOMB ADJUVANTED 50 MCG/0.5ML IM SUSR: 0.5000 mL | Freq: Once | INTRAMUSCULAR | 1 refills | Status: AC

## 2018-10-02 MED ORDER — PNEUMOCOCCAL 13-VAL CONJ VACC IM SUSP: 0.5000 mL | Freq: Once | INTRAMUSCULAR | 0 refills | Status: AC

## 2018-10-02 NOTE — Progress Notes (Addendum)
Patient was seen today in a co-visit with [facility].  Medications were reviewed with the patient, including name, instructions, indication, goals of therapy, potential side effects, importance of adherence, and safe use. Patient has a good understanding of medications, she self manages with help of family and uses a pill box at home. Medication list was updated to reflect current treatment.  Findings/recommendations: Anticoagulation: Patient history includes DVT, PE, lupus anticoagulant+, CVA lupus cerebritis. She has taken aspirin 81 mg daily and warfarin in the past, but is now currently on Xarelto. Recommend clarification of Xarelto dosing, unclear indication for 15 mg (confirmed by contacting pharmacy and reviewing medication bottle she is currently taking 15 mg). Xarelto 20 mg is also listed on her medication profile.   Consider SNRI for depression/pain, would recommend duloxetine 30 mg (patient has tried citalopram, escitalopram, paroxetine, vortioxetine and sertraline in the past). Notably for pain, patient is taking pregabalin 200 mg TID and oxcarbazepine 150 mg TID for pain, she reports efficacy as reflected by decreased use of PRN tramadol. Patient reports only needing tramadol sparingly (less than daily). She does state she has trouble with dizziness. Duloxetine may help with pain efficacy to the extent that we may be able to consider decreasing the dose of pregabalin and reduce dizziness for patient in the future.  PCV13 due to immunosuppressive therapy (prescription for PCV13 sent to [facility] pharmacy). Patient may also be a candidate for Shingrix vaccine, although she was not interested, she did request antibody testing for varicella IgG to verify candidacy. Shingrix prescription was also sent to [facility] pharmacy.      Patient did mention she was advised to start a ferrous sulfate supplementation, would recommend follow-up iron panel to help determine whether this  is needed.  No medication changes were made today, patient was advised to contact me if medication-related questions surface. Patient verbalized understanding and was also provided an information handout.

## 2018-10-02 NOTE — BH Specialist Note (Signed)
Type of Service: Clinical Social Work Education officer, museum time: 4:00   End time: 4:15 Total time: 15 minutes  Demographics Andrea Berry is a 53 y.o. female  referred by Dr. McDiarmid for assessing social needs, support and advance directives.  Patient  was accompanied by her Pastor  April. Reports :No concerns with social needs today. No barriers to care identified.  Family/Social Information Patient lives with son [name] [phone]). Transportation to appointments provided by [facility] and family. No Financial concerns as of today. Receives the following services Food benefits, Social Security Disabilty, [employer] and [place] with  [facility].  Patient enjoys listening to music, watching TV and spending time with her grandchildren . She also active going to place of worship, to the store and visiting family and friends.  Advance Directives: Living Will or Medical POA? No ,  Durable POA? No. ; Is it on file at [facility] No. LCSW. reviewed and provided resources to patient and Doristine Bosworth.   Interventions :     [employer] and Advocacy/Education   Clinical Impressions/Recommendations :No psychosocial stressors or barriers identified today. Patient has a great support system and social interaction.  LCSW reviewed Living Will and Medical POA information with patient.  Patient is not willing to make commitment at this time to move forward with living will, Health care POA or [place], however she would like the inforamtion.  Educational information offered to patient/family/caregiver:  1. Copy of Living Will and Medical POA,  2. Resources for Occupational hygienist for [employer] Power of Attorney Plan:    1. Patient will review information provided   2. Referral:Legal Aid,   Sharion Settler [facility]   [phone] 4:28 PM

## 2018-10-02 NOTE — Progress Notes (Signed)
Geriatrics Visit Note  Provider: Dr. Wendy Berry Location of Care: [facility]  Patient is accompanied by her pastor.   Patient's son lives with her and helps provide her care. Patient information was obtained from the patient herself. History/Exam limitations: Patient reports that she does not feel she has problems with thinking or memory. She has not noticed tremor, and her only hallucinations were in the distant past when she took diazepam. She does not endorse feelings of paranoia or delusions.   The family are not present to help provide history.    Chief Complaint:  Chief Complaint  Patient presents with  . Geri assessment    HISTORY OF PRESENT ILLNESS: Andrea Berry is a 53 y.o. female.  Outpatient Encounter Medications as of 10/02/2018  Medication Sig  . alendronate (FOSAMAX) 70 MG tablet TAKE 1 TABLET BY MOUTH IN THE MORNING EVERY 7 DAYS  . azaTHIOprine (IMURAN) 50 MG tablet Take 50 mg by mouth 2 (two) times daily.   . ciprofloxacin-dexamethasone (CIPRODEX) OTIC suspension Place 4 drops into the right ear 2 (two) times daily.  Marland Kitchen esomeprazole (NEXIUM) 40 MG capsule TAKE 1 CAPSULE BY MOUTH TWICE A DAY  . LINZESS 145 MCG CAPS capsule TAKE ONE CAPSULE EVERY DAY ON EMPTY STOMACH AT LEAST 30MIN BEFORE 1ST MEAL OF THE DAY  . LORazepam (ATIVAN) 0.5 MG tablet Take 1 tablet (0.5 mg total) by mouth 2 (two) times daily as needed for anxiety.  Marland Kitchen omeprazole (PRILOSEC) 20 MG capsule TAKE 1 CAPSULE BY MOUTH EVERYDAY AT BEDTIME  . predniSONE (DELTASONE) 10 MG tablet Take 10 mg daily by mouth.  . pregabalin (LYRICA) 200 MG capsule TAKE 1 CAPSULE BY MOUTH THREE TIMES A DAY  . RiTUXimab (RITUXAN IV) Inject 1,000 mg into the vein.  . rivaroxaban (XARELTO) 20 MG TABS tablet Take 20 mg by mouth daily.  . traMADol (ULTRAM) 50 MG tablet Take 1 tablet (50 mg total) by mouth 3 (three) times daily.  Alveda Reasons 15 MG TABS tablet TAKE 1 TABLET BY MOUTH EVERY DAY  IN THE EVENING WITH DINNER   No facility-administered encounter medications on file as of 10/02/2018.      History Past Medical History:  Diagnosis Date  . Arthritis   . Blind   . Chronic central neuropathic pain 10/02/2018   [facility] 09/2017  . Depression   . Devic's disease ([place])   . GERD (gastroesophageal reflux disease)   . Headache(784.0)   . History of kidney stones   . Hypertension   . Lupus ([place])   . Shortness of breath    due to medication  . Stroke [facility]) 2008   visually impaired   Past Surgical History:  Procedure Laterality Date  . BREAST BIOPSY    . BREAST CYST EXCISION Left pt unsure  . COLONOSCOPY WITH PROPOFOL N/A 07/26/2017   Procedure: COLONOSCOPY WITH PROPOFOL;  Surgeon: Carol Ada, MD;  [facility]: [facility];  Service: Endoscopy;  Laterality: N/A;  . ESOPHAGOGASTRODUODENOSCOPY (EGD) WITH PROPOFOL N/A 07/26/2017   Procedure: ESOPHAGOGASTRODUODENOSCOPY (EGD) WITH PROPOFOL;  Surgeon: Carol Ada, MD;  [facility];  Service: Endoscopy;  Laterality: N/A;  . PORTA CATH INSERTION    . TUBAL LIGATION    . TUBAL LIGATION     Family History  Problem Relation Age of Onset  . Cancer Mother   . Cancer Father   . Lupus Sister     reports that she has never smoked. She has  never used smokeless tobacco. She reports that she does not drink alcohol or use drugs.  Activities of Daily Living - Current Assessment walk with assistance Completes all ADLs  Medication administration: Administers medications on her own. She can feel the bottle and texture of the pills to identify pills.  Overall functional status: Self care  Comments:  She makes her own grocery shopping and financial decisions but does have some help from her son due to blindness Falls in the past six months:  Patient does endorse frequent falls  Diet: Regular diet   Vitals:   10/02/18 1435  BP: 126/84  Temp: 98.4 F (36.9 C)   Filed Weights   10/02/18 1435   Weight: 200 lb (90.7 kg)    Review of Systems  Review of Systems  Denies hearing loss, constipation, skin changes (other than occasional lupus rash). No fecal or urinary incontinence.   PHYSICAL EXAM: GEN: NAD, alert, cooperative, and pleasant. RESPIRATORY: Comfortable work of breathing, speaks in full sentences CV: Regular rate noted, distal extremities well perfused and warm without edema GI: Soft, nondistended SKIN: warm and dry, no rashes or lesions MSK: Moves 4 extremities equally PSYCH: AAOx3, appropriate affect Psych:  Fully oriented, Judgment and insight normal, Memory normal for long and short term, Mood and affect appropriate Blind-Moca corrected score: 19/30 - deficits primarily in attention. She was unable to subtract serial sevens and her language score was 1/3. Delayed recall was 3/5. She scored a 0 in Abstraction Geriatric Depression Scale: 8/15   Assessment and Plan:   Problem List Items Addressed This Visit      Other   Chronic central neuropathic pain    Other Visit Diagnoses    Preventive measure    -  Primary      1. Patient would like a referral for a rheumatologist.  2. Geriatric Assessment: Patient is independent in coordinating and/or completing her ADLs and iADLs with some assistance from her son for safety purposes due to her vision loss.   3. Vaccinations: Recommend Prevnar and Shingrix. Patient elects to test for varicella IgG prior to deciding to obtain shingrix.  4. Vitamin D was drawn today, would consider supplementation based on these results. Patient is at increased risk for falls.  5. Depression: patient screens 8/15 on the Geriatric depression scale, which is a positive screen. Recommend starting an SNRI for the patient. Would consider Venlafaxine which may help with pain as well as depression. Pharmacy checked her medication list and found no potential negative interactions.  6. PT: recommends referral back to home health PT for  repeated falls, muscle weakness, difficulty walking. Referral placed today.   7. Memory: primarily attention deficits on MOCA-Blind, which was adjusted for her 12 years of education to be a 19/30.

## 2018-10-02 NOTE — Therapy (Signed)
[place], [place], [address] Phone: [phone]   Fax:  [phone]  Physical Therapy PT Screen  Patient Details  Name: Andrea Berry MRN: [id number] Date of Birth: 1966-07-25 No data recorded  Encounter Date: 10/02/2018    Past Medical History:  Diagnosis Date  . Arthritis   . Blind   . Chronic central neuropathic pain 10/02/2018   [facility] 09/2017  . Depression   . Devic's disease ([place])   . GERD (gastroesophageal reflux disease)   . Headache(784.0)   . History of kidney stones   . Hypertension   . Lupus ([place])   . Shortness of breath    due to medication  . Stroke [facility]) 2008   visually impaired    Past Surgical History:  Procedure Laterality Date  . BREAST BIOPSY    . BREAST CYST EXCISION Left pt unsure  . COLONOSCOPY WITH PROPOFOL N/A 07/26/2017   Procedure: COLONOSCOPY WITH PROPOFOL;  Surgeon: Carol Ada, MD;  [facility];  Service: Endoscopy;  Laterality: N/A;  . ESOPHAGOGASTRODUODENOSCOPY (EGD) WITH PROPOFOL N/A 07/26/2017   Procedure: ESOPHAGOGASTRODUODENOSCOPY (EGD) WITH PROPOFOL;  Surgeon: Carol Ada, MD;  [facility];  Service: Endoscopy;  Laterality: N/A;  . PORTA CATH INSERTION    . TUBAL LIGATION    . TUBAL LIGATION      There were no vitals filed for this visit.         [facility] Pre-Surgical Assessment - 10/02/18 0001    5 Meter Walk Test- trial 1  23.4 sec    5 Meter Walk Test- trial 2  29 sec.     5 Meter Walk Test- trial 3  24.3 sec.    5 meter walk test average  25.57 sec    4 Stage Balance Test tolerated for:   4 sec.    4 Stage Balance Test Position  1    comment  Pt with SPPB 2/12  very high risk of falls    Sit To Stand Test- trial 1  32 sec.   using hands which is 0 for SPPB value   Comment  Pt unable to come to stand but able to use hands    0 for inablity to come to standing without UE support              Objective measurements completed on examination: See above findings.             PTSCREEN Based on above findings, pt is high at a high fall risk. Patient will highly benefit from a formal PT evaluation to address balance deficits/ strength issues for  Home Health Physical Therapy.  Pt is a 53 yo female. PT Screen.  Pt . High fall risk 4 stage balance test.Gait Velocity   .64 ft/sec  walking speed. Pt performed 5xSTS in 32 seconds but had to use upper extremity support which scores 0 on the SPPB  which is indicative of high fall risk And frailty and weakness.   Based on Short Physical Performance Battery, pt has a frailty rating of 2/12 with </= 5/12 considered frail and a score of </= 10/12 indicates one or more mobility limitations.  Pt would benefit from skilled PT to address impairments and decrease fall risk.  Pt reported having HHPT 6 months ago and was going to get a walker but never received it.  Pt would like an UPWalker which costs from $300 to $500  Patient will benefit from skilled therapeutic intervention in order to improve the following deficits and impairments:     Visit Diagnosis: Muscle weakness (generalized)     Problem List Patient Active Problem List   Diagnosis Date Noted  . Chronic central neuropathic pain 10/02/2018  . Anxiety 07/12/2018  . Chronic pain syndrome 07/12/2018  . Otalgia 07/12/2018  . 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 High risk medication use 01/03/2016  . Ataxia 01/03/2016  . Dysesthesia 01/03/2016  . Depression with anxiety 01/03/2016  . Lupus cerebritis ([place]) 11/25/2015  . UTI (lower urinary tract infection) 11/25/2015  . Hyponatremia 11/25/2015  . Anemia of chronic disease 10/28/2015    Andrea Berry, PT Certified Exercise Expert for the Aging Adult  10/02/18 4:03 PM Phone: [phone] Fax: [facility] [address] [place], [place], [address] Phone: [phone]   Fax:  [phone]  Name: Andrea Berry MRN: [id number] Date of Birth: 1966/03/01

## 2018-10-02 NOTE — Patient Instructions (Addendum)
Thank you for coming into our clinic for evaluation.  You made a great effort so we feel we can make some recommendations for you.  While you did show some mild impairment in your thinking, it is not enough to get in the way of you being able to take care of the tasks that help you stay independent in the community.   You can prevent worsening of thinking by eating healthy, staying physically active, control your blood pressure, cholesterol and avoid diabetes.  Taking group classes, like Bible study classes or community classes is a good way to keep your mind sharp.   We are making recommendation to your primary care provider for a few things: - Consider starting an antidepressant for your sadness and loss of pleasure in life.  We are recommending a medication called duloxetine because it helps with pain.  - We are checking your Vitamin D levels today and checking to see if you had the Chicken Pox when you were little.  If you have had the chicken pox then getting the shingles vaccination is a good idea.  There is a prescription for the shingles vaccination at your pharmacy.  If you decide to get it, the pharmacist will give you the shot.  - We recommend your get the vaccinations to prevent pneumococcal pneumonia.  We sent the prescription for this vaccination to your pharmacy.    Your leg strength and your balance need some work.  We are arranging for Physical Therapists to come out to your home to work on your strength and balance.   I do not believe that Medicare will pay for the "Up Walker".  It can be purchased at medical supply shops for between $350 to $500.   It is best that your primary care physician make any referral to specialists, like rheumatology.  We will send a note to your primary care provider letting them know that you are requesting this referral.

## 2018-10-03 LAB — VARICELLA ZOSTER ANTIBODY, IGG: Varicella zoster IgG: 579 index (ref >165)

## 2018-10-03 LAB — VITAMIN D 25 HYDROXY (VIT D DEFICIENCY, FRACTURES): Vit D, 25-Hydroxy: 19 ng/mL — ABNORMAL LOW (ref 30.0–100.0)

## 2018-10-06 ENCOUNTER — Telehealth: Payer: Self-pay | Admitting: Family Medicine

## 2018-10-06 DIAGNOSIS — E559 Vitamin D deficiency, unspecified: Secondary | ICD-10-CM

## 2018-10-06 DIAGNOSIS — F418 Other specified anxiety disorders: Secondary | ICD-10-CM

## 2018-10-06 DIAGNOSIS — F419 Anxiety disorder, unspecified: Secondary | ICD-10-CM

## 2018-10-06 DIAGNOSIS — Z23 Encounter for immunization: Secondary | ICD-10-CM

## 2018-10-06 DIAGNOSIS — G894 Chronic pain syndrome: Secondary | ICD-10-CM

## 2018-10-06 DIAGNOSIS — G8929 Other chronic pain: Secondary | ICD-10-CM

## 2018-10-06 DIAGNOSIS — M792 Neuralgia and neuritis, unspecified: Secondary | ICD-10-CM

## 2018-10-06 HISTORY — DX: Vitamin D deficiency, unspecified: E55.9

## 2018-10-06 MED ORDER — ZOSTER VAC RECOMB ADJUVANTED 50 MCG/0.5ML IM SUSR: 0.5000 mL | Freq: Once | INTRAMUSCULAR | 0 refills | Status: AC

## 2018-10-06 MED ORDER — DULOXETINE HCL 30 MG PO CPEP: 30.0000 mg | ORAL_CAPSULE | Freq: Every day | ORAL | 0 refills | Status: DC

## 2018-10-06 MED ORDER — VITAMIN D (ERGOCALCIFEROL) 1.25 MG (50000 UNIT) PO CAPS: 50000.0000 [IU] | ORAL_CAPSULE | ORAL | Status: DC

## 2018-10-06 MED ORDER — DULOXETINE HCL 60 MG PO CPEP: 60.0000 mg | ORAL_CAPSULE | Freq: Every day | ORAL | 5 refills | Status: DC

## 2018-10-08 ENCOUNTER — Encounter: Payer: Self-pay | Admitting: Family Medicine

## 2018-10-08 DIAGNOSIS — R531 Weakness: Secondary | ICD-10-CM

## 2018-10-08 DIAGNOSIS — Z9225 Personal history of immunosupression therapy: Secondary | ICD-10-CM | POA: Insufficient documentation

## 2018-10-08 DIAGNOSIS — G3184 Mild cognitive impairment, so stated: Secondary | ICD-10-CM

## 2018-10-08 DIAGNOSIS — Z8619 Personal history of other infectious and parasitic diseases: Secondary | ICD-10-CM

## 2018-10-08 DIAGNOSIS — Z7952 Long term (current) use of systemic steroids: Secondary | ICD-10-CM | POA: Insufficient documentation

## 2018-10-08 DIAGNOSIS — R2689 Other abnormalities of gait and mobility: Secondary | ICD-10-CM

## 2018-10-08 DIAGNOSIS — R42 Dizziness and giddiness: Secondary | ICD-10-CM

## 2018-10-08 HISTORY — DX: Dizziness and giddiness: R42

## 2018-10-08 HISTORY — DX: Long term (current) use of systemic steroids: Z79.52

## 2018-10-08 HISTORY — DX: Other abnormalities of gait and mobility: R26.89

## 2018-10-08 HISTORY — DX: Personal history of immunosuppression therapy: Z92.25

## 2018-10-08 HISTORY — DX: Personal history of other infectious and parasitic diseases: Z86.19

## 2018-10-08 HISTORY — DX: Mild cognitive impairment of uncertain or unknown etiology: G31.84

## 2018-10-08 HISTORY — DX: Weakness: R53.1

## 2018-10-08 NOTE — Progress Notes (Signed)
I have interviewed and examined the patient.  I have discussed the case and verified the key findings with Dr. Burr Medico.   I agree with their assessments and plans as documented in their geriatric consultation note for today.   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 Myelitis in 2017 - Followed by [facility] Dr Shelia Media and [facility] Dr Estanislado Pandy - Rituximab therapy h8istory    Depression with anxiety   Chronic central neuropathic pain - Recent initial consultation with [facility] - [place], Sheliah Mends MD            - Leg spasms in back, legs, feet            - most sign pain areas in back, legs, feet and left arm/hand            - worse with activity            - burning, throbbing, stabbing, constant   Relevant Orders   Home Health   Face-to-face encounter (required for Medicare/Medicaid patients)   Lupus anticoagulant disorder [facility])   Relevant Orders   Ambulatory referral to Rheumatology per request of patient.  Referral to Dr Marquis Buggy (Rheum).     Medium   Mild cognitive impairment - Primary - MoCA-BLIND corrected score 19 out of 30 is two S>D> below normal cutoff for cognitive score.  Patient remains independent in her iADLs and ADLs.  This lower score on validated test of cognitive domains with preserved independent functioning is c/w Mild Cognitive Impairment-nonamnestic type.  Cognitive domains with evidence of impairment are attention and abstraction.  - Periodic monitoring of iADLs for decline in independent instrumental functioning.       Low   Ataxia   Relevant Orders   Ambulatory referral to [place]     Unprioritized   Orthostatic dizziness, Recurrent - Patient coping by recognition of sensation and taking  actions for safety.  - Related to adjunct analgesic meds   Decreased strength - Decrease strength in proximal leg muscles bilaterally - decreased ability to make transfers from chair and impairs balance and safety - Rx HH PT evaluation and treatment   Relevant Orders   Ambulatory referral to [place]    Impairment of balance - Likely contributions from prior CNS injuries, blindness, bilateral leg muscle weakness and orthostatic dizziness,    Relevant Orders   Ambulatory referral to [place]   History of chicken pox - Patient did not believe that they have had chicken pox.  She agreed to have Shingrix vaccination series if prior infection with VZV demonstrated by serology testing.  Rx sent for Shingrix series to her pharmacy.    Other Visit Diagnoses    Preventive measure       Relevant Orders   Varicella zoster antibody, IgG (Completed)   VITAMIN D 25 Hydroxy (Vit-D Deficiency, Fractures) (Completed)   Abnormality of gait       Relevant Orders   Ambulatory referral to [place] problem       Muscle weakness (generalized)         Vitamin D Deficiency - Vitamin D 19 ng/mL (Low) - No supplement, dark complexion, primarily stays indoors -  Rx ergocalciferol 50K IU oral weekly x 12 weeks then reduce to cholcalciferol 1000 IU oral daily indefinitely - Scheduled DEXA because of chronic prednisone use  Follow up - Ms Bobby Rumpf requested to transfer her primary care to the [facility] with Dr British Moyd.  Ms Hopf will be seen in primary care follow up at our practice per her request.    60 minutes face to face where spent in total with counseling / coordination of care took more than 20 minutes of the total time. The problem list and recommendations for the patient was discussed among the four disciplines prior to meeting with the patient and their family/friend/caretaker.  Each discipline meet with the patient and family/friend/caretaker to assess the  issue particular to their disciplines.   Feedback was elicited from the patient and caretaker as to the findings and recommendations.

## 2018-10-08 NOTE — Telephone Encounter (Signed)
Encounter form opened in error.

## 2018-10-13 ENCOUNTER — Telehealth: Payer: Self-pay

## 2018-10-13 DIAGNOSIS — E559 Vitamin D deficiency, unspecified: Secondary | ICD-10-CM

## 2018-10-13 NOTE — Telephone Encounter (Signed)
Lattie Haw, called nurse line about patients Vitamin D prescription. Looks like the prescription was entered in as clinic administered medication and never made it to the pharmacy. I didn't see any notes about the office staff giving patient this medication. Please resend to pts pharmacy.

## 2018-10-14 MED ORDER — VITAMIN D (ERGOCALCIFEROL) 1.25 MG (50000 UNIT) PO CAPS: 50000.0000 [IU] | ORAL_CAPSULE | ORAL | 0 refills | Status: DC

## 2018-10-14 NOTE — Telephone Encounter (Signed)
Please let patient know their prescription(s) are available for pick up from their pharmacy.

## 2018-10-20 ENCOUNTER — Telehealth: Payer: Self-pay | Admitting: Family Medicine

## 2018-10-20 NOTE — Telephone Encounter (Signed)
Pt had a verbal order for Physical Therapy. Unfortunately, the pt never got a call from the pt. Please call the pt back at  [phone]. ad

## 2018-10-20 NOTE — Telephone Encounter (Signed)
Will forward to referral coordinator to see if she knows the status of this referral.  Looks like it has been closed.  Jazmin Hartsell,CMA

## 2018-10-28 ENCOUNTER — Other Ambulatory Visit: Payer: Self-pay | Admitting: Family Medicine

## 2018-10-28 DIAGNOSIS — Z1231 Encounter for screening mammogram for malignant neoplasm of breast: Secondary | ICD-10-CM

## 2018-11-01 ENCOUNTER — Other Ambulatory Visit: Payer: Self-pay | Admitting: Family Medicine

## 2018-11-01 DIAGNOSIS — M792 Neuralgia and neuritis, unspecified: Secondary | ICD-10-CM

## 2018-11-01 DIAGNOSIS — F418 Other specified anxiety disorders: Secondary | ICD-10-CM

## 2018-11-01 DIAGNOSIS — F419 Anxiety disorder, unspecified: Secondary | ICD-10-CM

## 2018-11-01 DIAGNOSIS — G894 Chronic pain syndrome: Secondary | ICD-10-CM

## 2018-11-01 DIAGNOSIS — G8929 Other chronic pain: Secondary | ICD-10-CM

## 2018-11-04 ENCOUNTER — Encounter: Payer: Self-pay | Admitting: Family Medicine

## 2018-11-06 ENCOUNTER — Encounter: Payer: Self-pay | Admitting: Family Medicine

## 2018-11-06 ENCOUNTER — Ambulatory Visit (INDEPENDENT_AMBULATORY_CARE_PROVIDER_SITE_OTHER): Payer: Medicare Other | Admitting: Family Medicine

## 2018-11-06 ENCOUNTER — Ambulatory Visit
Admission: RE | Admit: 2018-11-06 | Discharge: 2018-11-06 | Disposition: A | Discharge status: Home / Self Care | Payer: Medicare Other | Source: Ambulatory Visit | Attending: Family Medicine | Admitting: Family Medicine

## 2018-11-06 VITALS — BP 132/84 | Temp 98.5°F | Ht 62.0 in | Wt 198.0 lb

## 2018-11-06 DIAGNOSIS — R0609 Other forms of dyspnea: Secondary | ICD-10-CM

## 2018-11-06 DIAGNOSIS — M3219 Other organ or system involvement in systemic lupus erythematosus: Secondary | ICD-10-CM

## 2018-11-06 DIAGNOSIS — L819 Disorder of pigmentation, unspecified: Secondary | ICD-10-CM

## 2018-11-06 DIAGNOSIS — L989 Disorder of the skin and subcutaneous tissue, unspecified: Secondary | ICD-10-CM

## 2018-11-06 DIAGNOSIS — M792 Neuralgia and neuritis, unspecified: Secondary | ICD-10-CM

## 2018-11-06 DIAGNOSIS — Z9181 History of falling: Secondary | ICD-10-CM

## 2018-11-06 DIAGNOSIS — F418 Other specified anxiety disorders: Secondary | ICD-10-CM

## 2018-11-06 DIAGNOSIS — D6862 Lupus anticoagulant syndrome: Secondary | ICD-10-CM

## 2018-11-06 DIAGNOSIS — M329 Systemic lupus erythematosus, unspecified: Secondary | ICD-10-CM

## 2018-11-06 DIAGNOSIS — R2689 Other abnormalities of gait and mobility: Secondary | ICD-10-CM

## 2018-11-06 DIAGNOSIS — Z9225 Personal history of immunosupression therapy: Secondary | ICD-10-CM

## 2018-11-06 DIAGNOSIS — Z7952 Long term (current) use of systemic steroids: Secondary | ICD-10-CM

## 2018-11-06 DIAGNOSIS — R269 Unspecified abnormalities of gait and mobility: Secondary | ICD-10-CM

## 2018-11-06 DIAGNOSIS — R29898 Other symptoms and signs involving the musculoskeletal system: Secondary | ICD-10-CM

## 2018-11-06 DIAGNOSIS — G8929 Other chronic pain: Secondary | ICD-10-CM

## 2018-11-06 HISTORY — DX: Disorder of pigmentation, unspecified: L81.9

## 2018-11-06 NOTE — Patient Instructions (Addendum)
Please have a Chest Xray at the [facility].  This is to help [place] look for causes of shortness of breath when moving around.    We are checking blood work today to see if your heart is working extra hard and making you short of breath.   Please have a breathing test performed at the [facility] by Dr Valentina Lucks.   This will help [place] see if your lungs are causing you to be short of breath.    We will set up a Dermatology appointment for the skin lesion on your chest.  We will work on setting up a Rheumatologist for you locally.   We will work on getting your home health Physical Therapy started.

## 2018-11-07 ENCOUNTER — Encounter: Payer: Self-pay | Admitting: Family Medicine

## 2018-11-07 DIAGNOSIS — L989 Disorder of the skin and subcutaneous tissue, unspecified: Secondary | ICD-10-CM | POA: Insufficient documentation

## 2018-11-07 DIAGNOSIS — M3213 Lung involvement in systemic lupus erythematosus: Secondary | ICD-10-CM | POA: Insufficient documentation

## 2018-11-07 DIAGNOSIS — M329 Systemic lupus erythematosus, unspecified: Secondary | ICD-10-CM

## 2018-11-07 DIAGNOSIS — R0609 Other forms of dyspnea: Secondary | ICD-10-CM | POA: Insufficient documentation

## 2018-11-07 DIAGNOSIS — R0600 Dyspnea, unspecified: Secondary | ICD-10-CM

## 2018-11-07 HISTORY — DX: Disorder of the skin and subcutaneous tissue, unspecified: L98.9

## 2018-11-07 HISTORY — DX: Other forms of dyspnea: R06.09

## 2018-11-07 HISTORY — DX: Dyspnea, unspecified: R06.00

## 2018-11-07 HISTORY — DX: Systemic lupus erythematosus, unspecified: M32.9

## 2018-11-07 HISTORY — DX: Lung involvement in systemic lupus erythematosus: M32.13

## 2018-11-07 LAB — BRAIN NATRIURETIC PEPTIDE: BNP: 24 pg/mL (ref 0.0–100.0)

## 2018-11-07 NOTE — Progress Notes (Signed)
Subjective:    Patient ID: Andrea Berry, female    DOB: 01-Oct-1965, 53 y.o.   MRN: [id number] Andrea Berry is accompanied by niece Sources of clinical information for visit is/are patient and past medical records. Nursing assessment for this office visit was reviewed with the patient for accuracy and revision.   Previous Report(s) Reviewed: [facility] consult notes Depression screen [facility] 2/9 10/02/2018  Decreased Interest 0  Down, Depressed, Hopeless 1  PHQ - 2 Score 1  Altered sleeping 1  Tired, decreased energy 0  Change in appetite 0  Feeling bad or failure about yourself  2  Trouble concentrating 0  Moving slowly or fidgety/restless 0  Suicidal thoughts 0  PHQ-9 Score 4  Difficult doing work/chores -   Fall Risk  07/16/2018 07/16/2018 09/12/2017 12/20/2015 12/06/2015  Falls in the past year? 0 0 No No No  Risk for fall due to : Impaired vision;Medication side effect;Impaired mobility - Other (Comment) - -  Risk for fall due to: Comment - - legally blind - -    History/P.E. limitations: Mild cognitive impairment  Adult vaccines due  Topic Date Due  . PNEUMOCOCCAL POLYSACCHARIDE VACCINE AGE 80-64 HIGH RISK  10/05/2019 (Originally [birth year]/1970)  . TETANUS/TDAP  07/16/2028   There are no preventive care reminders to display for this patient. There are no preventive care reminders to display for this patient.   Chief Complaint  Patient presents with  . Follow-up     HPI  Skin Lesion - Onset: uncertain if it was before or after placement of Porta-cath (Port in from 03/2018 to 08/2018, port removed because it was causing discomfort) - Uncertain if it has changed in size because she is unable to see and see does not touch site bc of tenderness.  - No Prior evaluations of lesion. - no itching of lesion - Immunocompromised from, rituximab therapy for neuromyelitis optica     Abnormal Gait - Using walker - PT has not come out to home.  - no falls.    SLE - Patient  requesting referral back to Dr Chauncey Cruel. Devashwar (Rheum) for follow up of her SLE and assess if she is developing arthritic problems of Lupus - (+) ALA - Hx Lupus Cerebritis  Dyspnea on Exertion - Duration: months - Management: no inhalers, no home oxygen.  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  - CT base lung cuts showing scarring and bronchiectasis 2018  SH: Never smoked.   Review of Systems See hpi    Objective:   Physical Exam   Physical Exam VS reviewed General: NAD, groomed HEENT: oral mucosa without lesions; cervical lymphadenopathy none, conjuctiva: no injection EACs patent, TM with (+) LR and translucent bilat Cor: RRR, No M/G/R Lung: bilateral crackles lower half lung fields posterior, no acc mm use, no wheezing Skin: Primary Lesion (Macule/Nodule/Papule/Vesicule/Pustule/Wheal/Petechia) Single Vs coalesced lesion with excision site scaring where Porta-cath hub extracted  Question incomplete annular lesion? Lesion Color (Skin/Red/White/Brown-Black/Yellow) Black-brown Solitary Lesion (Yes/No) Two lesions separated by incision scar Secondary Lesion (Crust/Scale/Fissure/Erosion/Ulceration/Excoriation/Atrophy/Lichenified/Scar- Striae-Keloid) Crusting/scaling surface Potentially sun-exposed area of anterior chest skin Topology: Flat-topped      Assessment & Plan:  Visit Problem List with A/P  Skin lesion of chest wall New problem Pigmented Skin lesion, uncertain course, uncertain nature.  Located right anterior chest overlying prior site of [place] cath hub.   Asymmetric, >0.6  cm, irregular border, elevated, mild variation in color, surface is not verrucoid  Ddx: Nevus, dysplastic nevus, S. Keratosis, Dermatosis papulosis nigra, melanoma, atypical cutaneous lupus lesion?  There are enough concerning features that I will  refer to Dermatology for evaluation.   SLE (systemic lupus erythematosus related syndrome) ([place]) Established problem worsened.  Andrea Berry is concerned that she is developing lupus arthritis in her hands.  Additionally, He CT AP 04/2017 showed bronchiectasis and lung base scarring. Patient complaining of dyspnea on exertion with walking within her home.

## 2018-11-07 NOTE — Assessment & Plan Note (Addendum)
New problem Pigmented Skin lesion, uncertain course, uncertain nature.  Located right anterior chest overlying prior site of [place] cath hub.   Asymmetric, >0.6 cm, irregular border, elevated, mild variation in color, surface is not verrucoid  Ddx: Nevus, dysplastic nevus, S. Keratosis, Dermatosis papulosis nigra, melanoma, atypical cutaneous lupus lesion?  There are enough concerning features that I will refer to Dermatology for evaluation.

## 2018-11-07 NOTE — Assessment & Plan Note (Addendum)
Established problem worsened.  Ms Latchford is concerned that she is developing lupus arthritis in her hands.  Additionally, He CT AP 04/2017 showed bronchiectasis and lung base scarring. Patient complaining of dyspnea on exertion with walking within her home.   ROI for ov notes from Dr Chauncey Cruel. Devashwar requested for last 6 months.  Reply from office that 11/07/18 search by Dr Kirke Corin office did not find records for the patient.   Will resubmit ROI for records for last 6 month when patient was a patient of their practice.   Referral to patient's Rheumatologist, S. Devashwar for evalution.

## 2018-11-07 NOTE — Assessment & Plan Note (Signed)
Established problem [facility] Home Health PT referral did not go through.  Uncertain the reason.  Will make referral again.

## 2018-11-07 NOTE — Assessment & Plan Note (Signed)
Established problem. Stable. Continue cyclobenzaprine added by [facility].   Patient tolerating Duloxetine started 10/02/18 in [facility]

## 2018-11-07 NOTE — Assessment & Plan Note (Signed)
Established problem. Stable. Tolerating duloxetine 60 mg. Continue current therapy

## 2018-11-07 NOTE — Assessment & Plan Note (Addendum)
New complaint PMH significant for basal scarring and bronchiectasis on CT AP's lung cuts 2018 Patient becomes [facility] while walking in her home.  TTE for P.E. 2017 showed EF 65% but G2DD (no mention of right ventricular strain)  CXR today (11/06/18) shows: Cardiomegaly without pulmonary venous congestion. Progressive right base atelectasis/infiltrates. Small bilateral pleural effusions can not be excluded.  A/P Possible causal contributions from cardiac diastolic dysfunction and a progressive lung process. BNP = 24 (WNL) make heart failure unlikely origin of DOE. Possible repeat TTE Return [facility] for Spirometry to look for evidence of Restrictive Lunge Disease Need ambulatory pulse ox on room air when pt comes in for Spirometry.  May require referral to pulmonology in future for evaluation

## 2018-11-07 NOTE — Assessment & Plan Note (Signed)
>>  ASSESSMENT AND PLAN FOR SLE (SYSTEMIC LUPUS ERYTHEMATOSUS RELATED SYNDROME) (HCC) WRITTEN ON 11/10/2018  9:15 AM BY MCDIARMID, TODD D, MD  Established problem worsened.  Andrea Berry is concerned that she is developing lupus arthritis in her hands.  Additionally, He CT AP 04/2017 showed bronchiectasis and lung base scarring. Patient complaining of dyspnea on exertion with walking within her home.   ROI for ov notes from Dr GORMAN. Devashwar requested for last 6 months.  Reply from office that 11/07/18 search by Dr Ezekiel office did not find records for the patient.   Will resubmit ROI for records for last 6 month when patient was a patient of their practice.   Referral to patient's Rheumatologist, S. Devashwar for evalution.

## 2018-11-10 ENCOUNTER — Telehealth: Payer: Self-pay | Admitting: *Deleted

## 2018-11-10 NOTE — Telephone Encounter (Signed)
Deedra from [facility] calling for PT verbal orders as follows:  1 time(s) weekly for 1 week(s), then 2 time(s) weekly for 4 week(s)  You can leave verbal orders on confidential voicemail.  Venisha Boehning, Salome Spotted, CMA

## 2018-11-11 NOTE — Telephone Encounter (Signed)
Call in orders for PT  1 time(s) weekly for 1 week(s), then 2 time(s) weekly for 4 week(s)

## 2018-11-11 NOTE — Telephone Encounter (Signed)
Verbal orders called in 

## 2018-11-13 DIAGNOSIS — H6982 Other specified disorders of Eustachian tube, left ear: Secondary | ICD-10-CM

## 2018-11-13 DIAGNOSIS — H6992 Unspecified Eustachian tube disorder, left ear: Secondary | ICD-10-CM

## 2018-11-13 HISTORY — DX: Other specified disorders of eustachian tube, left ear: H69.82

## 2018-11-13 HISTORY — DX: Unspecified Eustachian tube disorder, left ear: H69.92

## 2018-11-17 ENCOUNTER — Other Ambulatory Visit: Payer: Self-pay | Admitting: Nurse Practitioner

## 2018-11-18 ENCOUNTER — Other Ambulatory Visit: Payer: Self-pay | Admitting: Family Medicine

## 2018-11-18 ENCOUNTER — Encounter: Payer: Self-pay | Admitting: Family Medicine

## 2018-11-18 DIAGNOSIS — R0609 Other forms of dyspnea: Principal | ICD-10-CM

## 2018-11-18 DIAGNOSIS — Z7952 Long term (current) use of systemic steroids: Secondary | ICD-10-CM

## 2018-11-18 DIAGNOSIS — H6980 Other specified disorders of Eustachian tube, unspecified ear: Secondary | ICD-10-CM

## 2018-11-18 DIAGNOSIS — R9389 Abnormal findings on diagnostic imaging of other specified body structures: Secondary | ICD-10-CM

## 2018-11-18 DIAGNOSIS — H6990 Unspecified Eustachian tube disorder, unspecified ear: Secondary | ICD-10-CM

## 2018-11-18 DIAGNOSIS — M329 Systemic lupus erythematosus, unspecified: Secondary | ICD-10-CM

## 2018-11-18 HISTORY — DX: Unspecified eustachian tube disorder, unspecified ear: H69.90

## 2018-11-18 HISTORY — DX: Other specified disorders of Eustachian tube, unspecified ear: H69.80

## 2018-11-19 NOTE — Telephone Encounter (Signed)
I informed Ms Andrea Berry that Dr Kirke Corin rheumatology office declined our referral to them for management of her SLE.  We discussed the possibility of infiltrate vs atx on CXR for her DOE that could represent a manifestation of her SLE.  She agreed to PFT testing at [facility] to look for restrictive lung process. Order for PFT was placed.   Ms Andrea Berry agreed to a referral to [facility] for initial Rheumatology consult.  Order for referral was placed  I agreed to prescribing Ms Andrea Berry' daily chronic prednisone for her SLE and to avoid her withdrawing from them abruptly.  Will await Rheumatology recommendations as to whether patient should continue on chronic corticostateoir therapy for her SL

## 2018-11-20 ENCOUNTER — Other Ambulatory Visit: Payer: Self-pay | Admitting: Nurse Practitioner

## 2018-11-24 ENCOUNTER — Ambulatory Visit (HOSPITAL_COMMUNITY)
Admission: RE | Admit: 2018-11-24 | Discharge: 2018-11-24 | Disposition: A | Discharge status: Home / Self Care | Payer: Medicare Other | Source: Ambulatory Visit | Attending: Family Medicine | Admitting: Family Medicine

## 2018-11-24 ENCOUNTER — Other Ambulatory Visit: Payer: Self-pay

## 2018-11-24 DIAGNOSIS — R0609 Other forms of dyspnea: Secondary | ICD-10-CM | POA: Diagnosis present

## 2018-11-24 DIAGNOSIS — R9389 Abnormal findings on diagnostic imaging of other specified body structures: Secondary | ICD-10-CM | POA: Insufficient documentation

## 2018-11-24 LAB — PULMONARY FUNCTION TEST
DL/VA % pred: 98 %
DL/VA: 4.3 ml/min/mmHg/L
DLCO unc % pred: 71 %
DLCO unc: 14.09 ml/min/mmHg
FEF 25-75 Post: 2.28 L/sec
FEF 25-75 Pre: 2.51 L/sec
FEF2575-%CHANGE-POST: -9 %
FEF2575-%PRED-POST: 98 %
FEF2575-%Pred-Pre: 108 %
FEV1-%Change-Post: 0 %
FEV1-%PRED-POST: 92 %
FEV1-%Pred-Pre: 93 %
FEV1-Post: 2 L
FEV1-Pre: 2.01 L
FEV1FVC-%CHANGE-POST: 1 %
FEV1FVC-%Pred-Pre: 105 %
FEV6-%Change-Post: -1 %
FEV6-%Pred-Post: 87 %
FEV6-%Pred-Pre: 88 %
FEV6-Post: 2.28 L
FEV6-Pre: 2.3 L
FEV6FVC-%Change-Post: 1 %
FEV6FVC-%Pred-Post: 103 %
FEV6FVC-%Pred-Pre: 101 %
FVC-%Change-Post: -2 %
FVC-%Pred-Post: 85 %
FVC-%Pred-Pre: 87 %
FVC-Post: 2.28 L
FVC-Pre: 2.33 L
Post FEV1/FVC ratio: 88 %
Post FEV6/FVC ratio: 100 %
Pre FEV1/FVC ratio: 86 %
Pre FEV6/FVC Ratio: 99 %

## 2018-11-24 MED ORDER — ALBUTEROL SULFATE (2.5 MG/3ML) 0.083% IN NEBU
2.5000 mg | INHALATION_SOLUTION | Freq: Once | RESPIRATORY_TRACT | Status: AC
  Administered 2018-11-24: 2.5 mg via RESPIRATORY_TRACT

## 2018-11-25 ENCOUNTER — Telehealth: Payer: Self-pay | Admitting: Family Medicine

## 2018-11-25 MED ORDER — IPRATROPIUM BROMIDE 0.03 % NA SOLN: 2.0000 | Freq: Two times a day (BID) | NASAL | 12 refills | Status: DC

## 2018-11-25 MED ORDER — TRAMADOL HCL 50 MG PO TABS: 50.0000 mg | ORAL_TABLET | Freq: Two times a day (BID) | ORAL | 5 refills | Status: DC | PRN

## 2018-11-25 NOTE — Telephone Encounter (Signed)
I discussed the normal results on Andrea Berry' PFT/Lung Volume/DLCO testing, and the normal BNP serum results.  This is reassuring that her lungs and heart are likely not the source of her fatigue. [name] been working two weeks with [facility] PT which she believes is helping her.   She is concerned about a cough that has been present for about three to 4 month.  NO Fever. No travel.  Remains in home much of the time.  No contact with sick individual/CoVid individuals.   Cough is worse at night, and can wake her up gasping for air occasionally. Coughs periods come every year or two and last for months. Her prior PCP had tried trials of antibiotics twice for cough without improvement of cough.  She recalls having a Sleep Study in [facility] about 5 years ago that was normal.  She recently seen by [facility] ENT for eustacian tube dysfuntion with MEE. Patient is prescribed Flonase by them.  She is taking it.   She already takes Nexium 40 mg twice a day.   She is not on an ACEI. No hx of asthma. No COPD.  No smoking.   She does feel like she has postnasal drip that strangles her at night, then she coughs out.    Cough Ddx: Postnasal drip / Upper Airway Cough Postinfectious/postviral cough External Ear or middle ear disease  Plan Start Empiric Trial of Atrovent NS 0.3%  Continue Flonase NS, Valsalva maneuver per ENT rec.  Continue High-dose PPI  Chronic Pain Syndrome - - NCCSRS db review without evidence of aberrant behavior.  Plan  Refilled Tramadol 50 mg BID #60/month with 5 refills.

## 2018-11-28 ENCOUNTER — Telehealth: Payer: Self-pay

## 2018-11-28 NOTE — Telephone Encounter (Signed)
Andrea Berry, PT with [facility], called nurse line to inform PCP that the pt is not feeling well today, so she declined her PT apt.   Davids call back if you need him [phone]

## 2018-11-28 NOTE — Telephone Encounter (Signed)
reviewed

## 2018-11-29 ENCOUNTER — Telehealth (INDEPENDENT_AMBULATORY_CARE_PROVIDER_SITE_OTHER): Payer: Medicare Other | Admitting: Family Medicine

## 2018-11-29 DIAGNOSIS — J411 Mucopurulent chronic bronchitis: Secondary | ICD-10-CM | POA: Diagnosis not present

## 2018-11-29 DIAGNOSIS — R05 Cough: Secondary | ICD-10-CM

## 2018-11-29 DIAGNOSIS — J329 Chronic sinusitis, unspecified: Secondary | ICD-10-CM | POA: Diagnosis not present

## 2018-11-29 NOTE — Telephone Encounter (Signed)
**  After Hours/ Emergency Line Call**  Received a call to report that Andrea Berry has been coughing off and on and spitting up phlegm for the past 3 days.  She denies any fevers, last checked day before yesterday.  She has not checked her temperature today.  She denies any chills or body aches.  She endorses some head congestion.  Denies runny nose, difficulty breathing, known sick contacts.  She has been taking NyQuil for this but does not report much help.  She does not have a history of asthma or COPD.  She is eating and drinking normally.  Denies any recent medication changes.  Symptoms consistent with viral URI, currently not suspicious for COVID-19.  Recommended symptomatic management with warm liquids, honey for cough, antihistamine for congestion.  Reviewed importance of social distancing given recent viral outbreak.  Patient will call if symptoms worsen or are not better within a week or 2 or if she develops fevers.  Will forward to PCP.  Rory Percy, DO PGY-2, [facility] 11/29/2018 12:02 PM

## 2018-12-02 ENCOUNTER — Other Ambulatory Visit: Payer: Self-pay | Admitting: Nurse Practitioner

## 2018-12-02 ENCOUNTER — Telehealth: Payer: Self-pay

## 2018-12-02 DIAGNOSIS — R05 Cough: Secondary | ICD-10-CM

## 2018-12-02 DIAGNOSIS — J329 Chronic sinusitis, unspecified: Secondary | ICD-10-CM

## 2018-12-02 DIAGNOSIS — R053 Chronic cough: Secondary | ICD-10-CM

## 2018-12-02 MED ORDER — HYDROCODONE-HOMATROPINE 5-1.5 MG/5ML PO SYRP: 5.0000 mL | ORAL_SOLUTION | Freq: Four times a day (QID) | ORAL | 0 refills | Status: DC | PRN

## 2018-12-02 MED ORDER — AMOXICILLIN-POT CLAVULANATE 875-125 MG PO TABS: 1.0000 | ORAL_TABLET | Freq: Two times a day (BID) | ORAL | 0 refills | Status: AC

## 2018-12-02 NOTE — Telephone Encounter (Signed)
[facility] Telemedicine Visit  Patient consented to have visit conducted via telephone.  Encounter participants: Patient: Andrea Berry  Provider: Sherren Mocha Evalin Shawhan  Others (if applicable): I spoke with patient's niece, Andrea Berry, prior to this phone call to discuss her concerns about her aunt.   Chief Complaint: Cough with rib cage pain with coughing   ROS: see hpi  Pertinent PMHx: Has responded transiently to antibiotics by prior PCP in past  Assessment/Plan:  No problem-specific Assessment & Plan notes found for this encounter.    Time spent on phone with patient: 7 minutes   CC: chest pain and productive cough  Covid-qualifying symptoms - Fever: no          - Fever description: none          - Rigors: no  - Acute soaking sweats: no  - Acuity of onset: gradual starting 2 months ago  - Shortness of breath at rest (worse from baseline): no  - Shortness of breath with exertion (worse from baseline): no  - Function (impairment in iADLs, ADLs): no  - Course of symptoms: gradually worsened  - Comorbidities:  Patient Active Problem List   Diagnosis Date Noted  . SLE (systemic lupus erythematosus related syndrome) ([place]) 11/07/2018    Priority: High  . Chronic central neuropathic pain 10/02/2018    Priority: High  . Lupus anticoagulant disorder ([place]) 10/02/2018    Priority: High  . Neuromyelitis optica ([place]) 02/24/2016    Priority: High  . 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 Depression with anxiety 01/03/2016    Priority: High  . Legal blindness 01/30/2014    Priority: High  . Mild cognitive impairment 10/08/2018    Priority: Medium  . Personal history of immunosupression therapy 10/08/2018    Priority: Medium  . Long-term corticosteroid use 10/08/2018    Priority: Medium  . Vitamin D deficiency 10/06/2018    Priority: Medium  . Chronic pain syndrome 07/12/2018    Priority: Medium  . High risk medication  use 01/03/2016    Priority: Medium  . Eustachian tube dysfunction 11/18/2018    Priority: Low  . Anxiety 07/12/2018    Priority: Low  . Ataxia 01/03/2016    Priority: Low  . Dysesthesia 01/03/2016    Priority: Low  . Anemia of chronic disease 10/28/2015    Priority: Low  . Skin lesion of chest wall 11/07/2018  . Dyspnea on exertion 11/07/2018  . Pigmented skin lesion of uncertain nature 41/74/0814  . Orthostatic dizziness, Recurrent 10/08/2018  . Decreased strength 10/08/2018  . Impairment of balance 10/08/2018     - Immunostatus: Steroid Use and azathioprine and rituximab  CoVid19 Risk factors Travel to CoVid19 high risk areas in last 14 days from date of symptom onset: yes  Exposure to laboratory confirmed [place] patient in last 14 days: no  Exposure to a Person Under Investigation (PUI) in last 3 days: no  Known exposure to any person, including health care worker, who has had close contact with a laboratory-confirmed CoVid19 patient within last 14 days: no  Occupation: retired.  Rarely goes out of home   Flu Risk Factors Headache: no  Muscle aches: no  Severe fatigue: no  UPPER RESPIRATORY INFECTION  Sick contacts: son with head cold last week who is getting much better   Nasal discharge (color,laterality): no  Sinusitis Risk Factors Fever: no   Headache/face pain: facial pressure  Double sickening: no  Tooth pain: no  Allergy Risk Factors: Sneezing: no  Itchy scratchy throat: no  Seasonal sx: yes    Red Flags  Weight Loss:  no Stiff neck: no  Rash: no  Swallowing pain: no   PMH: SLE.  Recent normal PFT w and w/o BD and Lung volumes.            On immunosuppresants for Devic's disease  ROS: see hpi   A/ Chronic Cough - Worsening - Mechanical rib pain from coughing - Possible chronic sinusitis  P/ Augmentin 875 tab BID x 14 days Hycodan cough syrup prn q 6 hours painful cough

## 2018-12-02 NOTE — Addendum Note (Signed)
Addended byWendy Poet, [place] Sine D on: 12/02/2018 12:19 PM   Modules accepted: Orders

## 2018-12-02 NOTE — Telephone Encounter (Signed)
April called nurse line on behalf of patient. April stated she spoke with patient this morning who could barely have a conversation on the phone due to coughing so much. The patient has tried the recommendations given by on call physician with no relief. Will forward to MD.   You can call April directly since the patient is having a hard time with coughing. [phone].

## 2018-12-02 NOTE — Telephone Encounter (Signed)
Pt called nurse line stating the hydrocodone cough syrup is 64 dollars and the patient can not afford this. Can something else be called in for her?

## 2018-12-03 ENCOUNTER — Telehealth: Payer: Self-pay | Admitting: Family Medicine

## 2018-12-03 MED ORDER — CHLORPHENIRAMINE-PHENYLEPHRINE 4-10 MG PO TABS: ORAL_TABLET | ORAL | 1 refills | Status: DC

## 2018-12-03 MED ORDER — GUAIFENESIN-CODEINE 100-10 MG/5ML PO SOLN: 10.0000 mL | Freq: Four times a day (QID) | ORAL | 0 refills | Status: DC | PRN

## 2018-12-03 NOTE — Telephone Encounter (Signed)
Pt informed.  She will call and check price of new meds. Christen Bame, CMA

## 2018-12-03 NOTE — Addendum Note (Signed)
Addended by: Lissa Morales D on: 12/03/2018 08:02 AM   Modules accepted: Orders

## 2018-12-03 NOTE — Telephone Encounter (Signed)
Spoke with Dedra from [place], and she stated that [name] may be having an reaction to combining the codeine syrup, and ativan or Codeine syrup and flexeril. Patient also met all goals for PT, and discharged today. Call Dedra back [phone].

## 2018-12-03 NOTE — Telephone Encounter (Signed)
Please let patient know that two prescriptions were sent into pharmacy to replace previously prescribed expensive cough syrup.  1. Codeine & guaifenesin cough syrup 2. Antihistamine with decongestant tablet.  Ms Colley can take cough syrup every 6 hours, if needed for cough.  Whenever she takes the cough syrup, she should take 1 tablet of the prescribed antihistamine with decongestant medication.

## 2018-12-04 NOTE — Telephone Encounter (Signed)
I spoke with Ms Andrea Berry. She feels like her cough is a little better.  Still with pain in ribs with coughing. She reports not feeling excessively drowsy or uncoordinated with new medications. She has not had a fall. She was able to report details from her PT session yesterday without prompting.  She is hopeful that she will continue to improve.   We talked about reasons to contact our office, including but not limited to, increase cough, fever, shaking chills, sweats, muscle aching.  We also dicussed sdie effects of medications to report including confusion, falls, incoordination, excess drowsiness.  Unlike TC 2 days ago, patient was not have paroxysms of coughing while talking.  She was speaking in full sentences.  She sounded mildly fatigued, but coherent speech and goal directed language.     A/ Chronic Cough, possible chronic sinusitis      Self-surveillance for ADE  P/ Continue current acute medications and chronic medications      Requested patient to reports any concerns to our office

## 2018-12-07 ENCOUNTER — Other Ambulatory Visit: Payer: Self-pay | Admitting: Nurse Practitioner

## 2018-12-08 ENCOUNTER — Other Ambulatory Visit: Payer: Self-pay | Admitting: Nurse Practitioner

## 2018-12-08 ENCOUNTER — Other Ambulatory Visit: Payer: Self-pay

## 2018-12-08 DIAGNOSIS — F419 Anxiety disorder, unspecified: Secondary | ICD-10-CM

## 2018-12-08 NOTE — Telephone Encounter (Signed)
Lorazepam refill

## 2018-12-09 ENCOUNTER — Other Ambulatory Visit: Payer: Self-pay | Admitting: Family Medicine

## 2018-12-09 MED ORDER — LORAZEPAM 0.5 MG PO TABS: 0.5000 mg | ORAL_TABLET | Freq: Two times a day (BID) | ORAL | 5 refills | Status: DC | PRN

## 2018-12-10 ENCOUNTER — Other Ambulatory Visit: Payer: Self-pay | Admitting: Family Medicine

## 2018-12-10 ENCOUNTER — Encounter: Payer: Self-pay | Admitting: Family Medicine

## 2018-12-10 DIAGNOSIS — Z86711 Personal history of pulmonary embolism: Secondary | ICD-10-CM

## 2018-12-10 HISTORY — DX: Personal history of pulmonary embolism: Z86.711

## 2018-12-10 MED ORDER — RIVAROXABAN 20 MG PO TABS: 20.0000 mg | ORAL_TABLET | Freq: Every day | ORAL | 1 refills | Status: DC

## 2018-12-18 ENCOUNTER — Other Ambulatory Visit: Payer: Self-pay

## 2018-12-18 ENCOUNTER — Ambulatory Visit: Payer: Medicare Other | Admitting: Family Medicine

## 2018-12-18 ENCOUNTER — Telehealth (INDEPENDENT_AMBULATORY_CARE_PROVIDER_SITE_OTHER): Payer: Medicare Other | Admitting: Family Medicine

## 2018-12-18 ENCOUNTER — Encounter: Payer: Self-pay | Admitting: Family Medicine

## 2018-12-18 DIAGNOSIS — H548 Legal blindness, as defined in USA: Secondary | ICD-10-CM

## 2018-12-18 DIAGNOSIS — Z9225 Personal history of immunosupression therapy: Secondary | ICD-10-CM

## 2018-12-18 DIAGNOSIS — D6862 Lupus anticoagulant syndrome: Secondary | ICD-10-CM

## 2018-12-18 DIAGNOSIS — L819 Disorder of pigmentation, unspecified: Secondary | ICD-10-CM

## 2018-12-18 DIAGNOSIS — Z7952 Long term (current) use of systemic steroids: Secondary | ICD-10-CM

## 2018-12-18 DIAGNOSIS — J329 Chronic sinusitis, unspecified: Secondary | ICD-10-CM

## 2018-12-18 DIAGNOSIS — R05 Cough: Secondary | ICD-10-CM

## 2018-12-18 DIAGNOSIS — Z91048 Other nonmedicinal substance allergy status: Secondary | ICD-10-CM

## 2018-12-18 DIAGNOSIS — M329 Systemic lupus erythematosus, unspecified: Secondary | ICD-10-CM

## 2018-12-18 DIAGNOSIS — R053 Chronic cough: Secondary | ICD-10-CM

## 2018-12-18 MED ORDER — AMOXICILLIN-POT CLAVULANATE 875-125 MG PO TABS: 1.0000 | ORAL_TABLET | Freq: Two times a day (BID) | ORAL | 0 refills | Status: DC

## 2018-12-18 NOTE — Assessment & Plan Note (Signed)
Patient reports that she has not received a appointment date for initial consultation with dermatology about the irregular pigmented lesion ovelying her prior portacath hub.  Will resubmit a more urgent dermatology referral to the [facility] dermatology physicians and APPs for evaluation and tx for lesion.

## 2018-12-18 NOTE — Assessment & Plan Note (Signed)
Established problem worsened.  Referral to local rheumatologists were unsuccessful Patient does have a referral to [facility] Rheumatology appt in mid-June Pt has difficult time finding someone to transport and accompany her (Pt is blind) throughout consultation. Ms Andrea Berry would like to see if Andrea Niemann, MD (Rheumatology in [place]) would take her on as a patient.  Will consult Ms Andrea Berry, to see if she can help with transportation and accompaniment for Ms Andrea Berry when she has doctor's appts.   Will send a referral to see if Dr Andrea Der would be willing to take [name] on as a patient to [facility] her SLE and lupus arthritis.  If she is, we will cancel the Rheumatology appt at [facility]

## 2018-12-18 NOTE — Assessment & Plan Note (Signed)
>>  ASSESSMENT AND PLAN FOR SLE (SYSTEMIC LUPUS ERYTHEMATOSUS RELATED SYNDROME) (HCC) WRITTEN ON 12/18/2018 11:49 AM BY MCDIARMID, TODD D, MD  Established problem worsened.  Referral to local rheumatologists were unsuccessful Patient does have a referral to [facility] Rheumatology appt in mid-June Pt has difficult time finding someone to transport and accompany her (Pt is blind) throughout consultation. Andrea Berry would like to see if Marjory Paterson, MD (Rheumatology in [place]) would take her on as a patient.  Will consult Andrea Georgina HUGHS, to see if she can help with transportation and accompaniment for Andrea Berry when she has doctor's appts.   Will send a referral to see if Dr Paterson would be willing to take Andrea Berry on as a patient to [facility] her SLE and lupus arthritis.  If she is, we will cancel the Rheumatology appt at [facility]

## 2018-12-18 NOTE — Assessment & Plan Note (Addendum)
Established problem worsened.  Initially controlled cough on cocktail of augmentin 875 BID x 14 days, ipratropium NS, Flonase NS, Chlortrimeton  Augmentin therapy ended 12/13/18 with return of cough around 12/16/18. Only minimal loose stools and fatigue with cocktail.   A/ Given antibiotic responsiveness working explanation is a chronic bacterial sinusitis =/- allergy component     Andrea Berry would like to hold off on going out for a limited sinus CT given pandemic situation.  P/  Additional 3 week trial of Augmentin 875 BID. Patient is to contact our office if the cough does not resolve during abx therapy or it recurs after end of abx therapy.   Continue ipratropium 0.03%, Flonase NS, and Chlortrimeton Referral to [place] for consideration of extrinsic allergy contribution to condition.  Pt is worried about role of house mold in her chronic cough.

## 2018-12-18 NOTE — Progress Notes (Signed)
[facility] Telemedicine Visit Call [phone] 4/9 [phone] Patient consented to have visit conducted via telephone.  Encounter participants: Patient: Andrea CIARAMITARO  Patient Location: Home Provider: Sherren Mocha Makhayla Mcmurry  Others (if applicable):   Chief Complaint: Cough  HPI: 3/21 TC: Complaint of cough x 2 months, Trial Augmentin 875 bid x 14 d for possible chronic sinusitis in immunosuppressed pt. Rx Hycodan cough syrup but pt unable to afford.  3/24 TC: Stop Hycodan rx bc price. Start Robitussion AC and combo antihis & decongestant tablets  3/25 TC: Cough little better, pain in ribs with coughing continues  12/18/18 VV:  Cough resolved soon after starting Augmentin (with ipratropium NS, Flonase, and Chlortrimeton).  Only mild nuisance diarrhea, no abd pain, some fatigue with Chlortrimeton, no falls/stumbles)   Covid-qualifying symptoms - Onset: none - Fever: absent - Rigors: no - Soaking sweats: no - Cough: Dry (see above) - Shortness of Breath: no shortness of breath   - Comorbidities: SLE, Devic' diseas - Immunostatus:Steroid Use and azathioprine  CoVid19 Risk factors Travel to CoVid19 high risk areas in last 14 days from date of symptom onset: no  Exposure to laboratory confirmed CoVid19 patient in last 14 days: no  Exposure to a Person Under Investigation (PUI) in last 3 days: no  Known exposure to any person, including health care worker, who has had close contact with a laboratory-confirmed CoVid19 patient within last 14 days: no  Occupation: disabled      Flu symptoms (Acute onset symptoms, Fever, Cough, malaise, absence nasal congestion, absence sneezing) Presence in Community:  no or Sick Contact:  no Headache: no Muscle aches: no Severe fatigue: no  UPPER RESPIRATORY INFECTION post nasal drip Sick contacts: no    PMH Eustacion Tube Dysfunction Diagnosed at [facility] ENT department 2020   Asthma or COPD: recent PFTs with and without  BD and with Lung Volumes showed not evidence obstructive, restrictive or BD responseivness.  PMH of Smoking: no  Using ACEIs: no     SH:  Social History   Tobacco Use  Smoking Status Never Smoker  Smokeless Tobacco Never Used    ROS: See HPI  PHYSICAL EXAM Respiratory: speaking in full sentence, no audible wheeze, voice prosodic   Assessment/Plan    Chronic cough Established problem worsened.  Initially controlled cough on cocktail of augmentin 875 BID x 14 days, ipratropium NS, Flonase NS, Chlortrimeton  Augmentin therapy ended 12/13/18 with return of cough around 12/16/18. Only minimal loose stools and fatigue with cocktail.   A/ Given antibiotic responsiveness working explanation is a chronic bacterial sinusitis =/- allergy component     Andrea Berry would like to hold off on going out for a limited sinus CT given pandemic situation.  P/  Additional 3 week trial of Augmentin 875 BID. Patient is to contact our office if the cough does not resolve during abx therapy or it recurs after end of abx therapy.   Continue ipratropium 0.03%, Flonase NS, and Chlortrimeton Referral to [place] for consideration of extrinsic allergy contribution to condition.  Pt is worried about role of house mold in her chronic cough.     Pigmented skin lesion of uncertain nature Patient reports that she has not received a appointment date for initial consultation with dermatology about the irregular pigmented lesion ovelying her prior portacath hub.  Will resubmit a more urgent dermatology referral to the [facility] dermatology physicians and APPs for evaluation and tx for lesion.   SLE (systemic lupus erythematosus  related syndrome) ([place]) Established problem worsened.  Referral to local rheumatologists were unsuccessful Patient does have a referral to [facility] Rheumatology appt in mid-June Pt has difficult time finding someone to transport and accompany her (Pt is blind) throughout  consultation. Andrea Berry would like to see if Sabino Niemann, MD (Rheumatology in [place]) would take her on as a patient.  Will consult Andrea Berry, to see if she can help with transportation and accompaniment for Andrea Berry when she has doctor's appts.   Will send a referral to see if Dr Dossie Der would be willing to take Andrea Berry on as a patient to [facility] her SLE and lupus arthritis.  If she is, we will cancel the Rheumatology appt at [facility]    Time spent on phone with patient: 21 minutes

## 2018-12-29 ENCOUNTER — Other Ambulatory Visit: Payer: Self-pay | Admitting: Nurse Practitioner

## 2019-01-01 ENCOUNTER — Other Ambulatory Visit: Payer: Medicare Other

## 2019-01-01 ENCOUNTER — Ambulatory Visit: Payer: Medicare Other

## 2019-01-06 ENCOUNTER — Other Ambulatory Visit: Payer: Self-pay | Admitting: Family Medicine

## 2019-01-06 DIAGNOSIS — E559 Vitamin D deficiency, unspecified: Secondary | ICD-10-CM

## 2019-01-07 ENCOUNTER — Encounter: Payer: Self-pay | Admitting: Allergy

## 2019-01-07 ENCOUNTER — Other Ambulatory Visit: Payer: Self-pay

## 2019-01-07 ENCOUNTER — Ambulatory Visit (INDEPENDENT_AMBULATORY_CARE_PROVIDER_SITE_OTHER): Payer: Medicare Other | Admitting: Allergy

## 2019-01-07 VITALS — BP 138/84 | HR 76 | Temp 98.8°F | Resp 16 | Ht 61.0 in | Wt 189.0 lb

## 2019-01-07 DIAGNOSIS — J310 Chronic rhinitis: Secondary | ICD-10-CM

## 2019-01-07 DIAGNOSIS — R05 Cough: Secondary | ICD-10-CM | POA: Diagnosis not present

## 2019-01-07 DIAGNOSIS — Z130 Encounter for screening for diseases of the blood and blood-forming organs and certain disorders involving the immune mechanism: Secondary | ICD-10-CM | POA: Diagnosis not present

## 2019-01-07 DIAGNOSIS — Z7952 Long term (current) use of systemic steroids: Secondary | ICD-10-CM | POA: Diagnosis not present

## 2019-01-07 DIAGNOSIS — R053 Chronic cough: Secondary | ICD-10-CM

## 2019-01-07 DIAGNOSIS — Z0000 Encounter for general adult medical examination without abnormal findings: Secondary | ICD-10-CM | POA: Insufficient documentation

## 2019-01-07 HISTORY — DX: Chronic rhinitis: J31.0

## 2019-01-07 NOTE — Assessment & Plan Note (Signed)
May have a component of PND triggering the cough.  Today's skin testing was negative to environmental allergy panel. Get bloodwork and will make additional recommendations based on results.  Continue Flonase 1-2 sprays daily.  Continue Atrovent 2 sprays twice a day for runny nose/PND

## 2019-01-07 NOTE — Assessment & Plan Note (Signed)
   See assessment and plan as above fore chronic cough.

## 2019-01-07 NOTE — Assessment & Plan Note (Addendum)
Chronic coughing with some sputum production for 1 year.worse during the fall and spring but no specific triggers noted. Patient had full PFT on 11/24/2018 which was unremarkable. She tried antibiotics which help but then coughing returns once she stops. Started on Flonase, ipratropium with minimal benefit. Has reflux and takes Nexium. No previous inhaler trial. Co-morbidities include steroid dependent, SLE, devic's disease and strokes.   Today's skin testing showed: negative to environmental allergy panel, and basic foods however positive control was borderline questioning the validity of the results. Sometimes this can happen in chronic prednisone use patients and will double check with bloodwork. The most common causes of chronic cough include the following: upper airway cough syndrome (UACS) which is caused by variety of rhinitis conditions; asthma; gastroesophageal reflux disease (GERD); chronic bronchitis from cigarette smoking or other inhaled environmental irritants; non-asthmatic eosinophilic bronchitis; and bronchiectasis. In prospective studies, these conditions have accounted for up to 94% of the causes of chronic cough in immunocompetent adults.   Based on her clinical history, not sure what's causing this cough and there are some concerns if this cough is multifactorial in nature.  Will do a trial of inhaler x 1 month as this was not tried previously.   Start Dulera 100 2 puffs twice a day with spacer and rinse mouth afterwards. Monitor symptoms.  If not improved may need pulmonology referral, cardiac work up and CXR/CT chest.   Asked son to look at the sputum color.   Also concerned about her immune system due to her chronic steroid use and the fact that antibiotics seem to help this cough.   Get bloodwork for basic immune evaluation.   Continue Flonase 1-2 sprays daily.  Continue Atrovent 2 sprays twice a day for runny nose/PND.  Continue Nexium 40mg  twice a day.  Continue  prednisone 10mg  daily as before.

## 2019-01-07 NOTE — Progress Notes (Signed)
New Patient Note  RE: Andrea Berry MRN: [id number] DOB: November 03, 1965 Date of Office Visit: 01/07/2019  Referring provider: McDiarmid, Blane Ohara, MD Primary care provider: McDiarmid, Blane Ohara, MD  Chief Complaint: Cough and Allergies  History of Present Illness: I had the pleasure of seeing Andrea Berry for initial evaluation at the [facility] on 01/07/2019. She is a 53 y.o. female, who is referred here by McDiarmid, Blane Ohara, MD for the evaluation of chronic cough/?mold allergy. Patient is accompanied by her son today.   She reports symptoms of chest tightness, shortness of breath, coughing, wheezing, nocturnal awakenings for 1 year. Most bothersome symptom is the cough which can be productive at times. Not sure what color it is as she is blind.The symptoms are present all year around with worsening in fall/spring.  Current medications include none. She reports not using aerochamber with inhalers. She tried the following inhalers: none. Main triggers are unknown.  Patient is currently on antibiotics and cough medications. She had multiple courses of antibiotics and the coughing completely resolves while on antibiotics but returns a few days afterwards.  Patient takes prednisone 10mg  tablet x 25 yrs for lupus and devic's disease -which is stable. She follows with rheumatology and neurology.   In the last month, frequency of symptoms: daily. Interference with physical activity: not sure as patient lives a sedentary lifestyle s/p stroke in 2017. Patient uses a walker to ambulate. Lifetime history of hospitalization for respiratory issues: no. Prior intubations: no. History of pneumonia: no. She was not evaluated by allergist/pulmonologist in the past. Smoking exposure: no. Up to date with flu vaccine: yes.   She has used Flonase 2 sprays once a day and ipratropium with minimal improvement in symptoms.  Patient has reflux and well controlled on Nexium.  Previous ENT evaluation: yes but  for eustachian tube dysfunction.  11/24/2018: The FVC, FEV1, FEV1/FVC ratio and FEF25-75% are within normal limits. Patient effort was erratic, but the normal FEV1 indicates no significant obstruction. Following administration of bronchodilators, there is no significant response. The reduced diffusing capacity indicates a mild loss of functional alveolar capillary surface. However, the diffusing capacity was not corrected for the patient's hemoglobin. Conclusions: The diffusion defect is consistent with a pulmonary vascular process. Anemia cannot be excluded as a potential cause of the diffusion defect without correcting the observed diffusing capacity for hemoglobin.  11/06/2018 CXR: IMPRESSION: 1.  Cardiomegaly.  No pulmonary venous congestion. 2. Progressive right base atelectasis/infiltrates. Small bilateral pleural effusions can not be excluded.  Assessment and Plan: Lien is a 53 y.o. female with: Chronic coughing Chronic coughing with some sputum production for 1 year.worse during the fall and spring but no specific triggers noted. Patient had full PFT on 11/24/2018 which was unremarkable. She tried antibiotics which help but then coughing returns once she stops. Started on Flonase, ipratropium with minimal benefit. Has reflux and takes Nexium. No previous inhaler trial. Co-morbidities include steroid dependent, SLE, devic's disease and strokes.   Today's skin testing showed: negative to environmental allergy panel, and basic foods however positive control was borderline questioning the validity of the results. Sometimes this can happen in chronic prednisone use patients and will double check with bloodwork. The most common causes of chronic cough include the following: upper airway cough syndrome (UACS) which is caused by variety of rhinitis conditions; asthma; gastroesophageal reflux disease (GERD); chronic bronchitis from cigarette smoking or other inhaled environmental irritants;  non-asthmatic eosinophilic bronchitis; and bronchiectasis. In prospective studies,  these conditions have accounted for up to 94% of the causes of chronic cough in immunocompetent adults.   Based on her clinical history, not sure what's causing this cough and there are some concerns if this cough is multifactorial in nature.  Will do a trial of inhaler x 1 month as this was not tried previously.   Start Dulera 100 2 puffs twice a day with spacer and rinse mouth afterwards. Monitor symptoms.  If not improved may need pulmonology referral, cardiac work up and CXR/CT chest.   Asked son to look at the sputum color.   Also concerned about her immune system due to her chronic steroid use and the fact that antibiotics seem to help this cough.   Get bloodwork for basic immune evaluation.   Continue Flonase 1-2 sprays daily.  Continue Atrovent 2 sprays twice a day for runny nose/PND.  Continue Nexium 40mg  twice a day.  Continue prednisone 10mg  daily as before.   Chronic rhinitis May have a component of PND triggering the cough.  Today's skin testing was negative to environmental allergy panel. Get bloodwork and will make additional recommendations based on results.  Continue Flonase 1-2 sprays daily.  Continue Atrovent 2 sprays twice a day for runny nose/PND  Current chronic use of systemic steroids  See assessment and plan as above fore chronic cough.   Return in about 4 weeks (around 02/04/2019).  Lab Orders     Allergens Zone 2     CBC with Differential/Platelet     IgG, IgA, IgM     Strep pneumoniae 23 Serotypes IgG     Diphtheria / Tetanus Antibody Panel  Other allergy screening: Asthma: no Rhino conjunctivitis: no Food allergy: no Medication allergy: no Hymenoptera allergy: localized swelling Urticaria: no Eczema:no History of recurrent infections suggestive of immunodeficency: no  Diagnostics: Spirometry:  Not done as she just had full body PFT last month.    Skin Testing: Environmental allergy panel and select foods.  Negative test to: environmental allergy panel, and basic foods however positive control was borderline questioning the validity of the results.  Results discussed with patient/family. Airborne Adult Perc - 01/07/19 1055    Time Antigen Placed  4742    Allergen Manufacturer  Lavella Hammock    Location  Back    Number of Test  59    Panel 1  Select    1. Control-Buffer 50% Glycerol  Negative    2. Control-Histamine 1 mg/ml  --   +/-   3. Albumin saline  Negative    4. Little Silver  Negative    5. [place]  Negative    6. Johnson  Negative    7. Verdigre Blue  Negative    8. Meadow Fescue  Negative    9. Perennial Rye  Negative    10. Sweet Vernal  Negative    11. Timothy  Negative    12. Cocklebur  Negative    13. Burweed Marshelder  Negative    14. Ragweed, short  Negative    15. Ragweed, Giant  Negative    16. Plantain,  English  Negative    17. Lamb's Quarters  Negative    18. Sheep Sorrell  Negative    19. Rough Pigweed  Negative    20. Marsh Elder, Rough  Negative    21. Mugwort, Common  Negative    22. Ash mix  Negative    23. Birch mix  Negative    24. Steva Colder American  Negative  25. Box, Elder  Negative    26. Cedar, red  Negative    27. Cottonwood, [place]  Negative    28. Elm mix  Negative    29. Hickory mix  Negative    30. Maple mix  Negative    31. Oak, [place] mix  Negative    32. Pecan Pollen  Negative    33. Pine mix  Negative    34. Sycamore Eastern  Negative    35. Francisco, Black Pollen  Negative    36. Alternaria alternata  Negative    37. Cladosporium Herbarum  Negative    38. Aspergillus mix  Negative    39. Penicillium mix  Negative    40. Bipolaris sorokiniana (Helminthosporium)  Negative    41. Drechslera spicifera (Curvularia)  Negative    42. Mucor plumbeus  Negative    43. Fusarium moniliforme  Negative    44. Aureobasidium pullulans (pullulara)  Negative    45. Rhizopus oryzae  Negative    46.  Botrytis cinera  Negative    47. Epicoccum nigrum  Negative    48. Phoma betae  Negative    49. Candida Albicans  Negative    50. Trichophyton mentagrophytes  Negative    51. Mite, D Farinae  5,000 AU/ml  Negative    52. Mite, D Pteronyssinus  5,000 AU/ml  Negative    53. Cat Hair 10,000 BAU/ml  Negative    54.  Dog Epithelia  Negative    55. Mixed Feathers  Negative    56. Horse Epithelia  Negative    57. Cockroach, German  Negative    58. Mouse  Negative    59. Tobacco Leaf  Negative     Food Perc - 01/07/19 1056    Time Antigen Placed  1610    Allergen Manufacturer  Lavella Hammock    Location  Back    Number of allergen test  10    Food  Select    1. Peanut  Negative    2. Soybean food  Negative    3. Wheat, whole  Negative    4. Sesame  Negative    5. Milk, cow  Negative    6. Egg White, chicken  Negative    7. Casein  Negative    8. Shellfish mix  Negative    9. Fish mix  Negative    10. Cashew  Negative       Past Medical History: Patient Active Problem List   Diagnosis Date Noted  . Encounter for screening for diseases of the blood and blood-forming organs and certain disorders involving the immune mechanism 01/07/2019  . Chronic rhinitis 01/07/2019  . Current chronic use of systemic steroids 01/07/2019  . History of pulmonary embolism 12/10/2018  . Eustachian tube dysfunction 11/18/2018  . SLE (systemic lupus erythematosus related syndrome) ([place]) 11/07/2018  . Pigmented skin lesion of uncertain nature 96/12/5407  . Mild cognitive impairment 10/08/2018  . Orthostatic dizziness, Recurrent 10/08/2018  . Decreased strength 10/08/2018  . Impairment of balance 10/08/2018  . Personal history of immunosupression therapy 10/08/2018  . Long-term corticosteroid use 10/08/2018  . Vitamin D deficiency 10/06/2018  . Chronic central neuropathic pain 10/02/2018  . Lupus anticoagulant disorder ([place]) 10/02/2018  . Anxiety 07/12/2018  . Chronic pain syndrome 07/12/2018  . Chronic  coughing 09/10/2016  . 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[place]) 01/03/2016  . High risk medication use 01/03/2016  . Ataxia 01/03/2016  . Dysesthesia 01/03/2016  . Depression with anxiety  01/03/2016  . Anemia of chronic disease 10/28/2015  . Legal blindness 01/30/2014   Past Medical History:  Diagnosis Date  . Anemia of chronic disease 10/28/2015  . Anxiety 07/12/2018  . Arthritis   . Ataxia 01/03/2016  . Blind   . Chronic central neuropathic pain 10/02/2018   [facility]2017  . Chronic pain syndrome 07/12/2018  . Decreased strength 10/08/2018   Proximal leg muscles  . Depression   . Depression with anxiety 01/03/2016  . Devic's disease ([place])   . Dysesthesia 01/03/2016  . Dyspnea on exertion 11/07/2018  . Eustachian tube dysfunction 11/18/2018   L>R. Followed by [facility].  . Eustachian tube dysfunction, bilateral 09/18/2018  . GERD (gastroesophageal reflux disease)   . Headache(784.0)   . History of chicken pox 10/08/2018  . History of CVA (cerebrovascular accident)   . History of kidney stones   . Hypertension   . Hyponatremia 11/25/2015  . Impairment of balance 10/08/2018  . Legal blindness 01/30/2014  . Long-term corticosteroid use 10/08/2018  . Lupus ([place])   . Lupus anticoagulant disorder ([place]) 10/02/2018   Pulmonary Embolism 2017  . Lupus cerebritis ([place]) 11/25/2015   Possible explanation of 11/25/2015 acute hyperintensity findings on Brain MRI.   Marland Kitchen 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[place]) 01/03/2016  . Orthostatic dizziness, Recurrent 10/08/2018   Associated with medications  . Otalgia 07/12/2018  . Personal history of immunosupression therapy 10/08/2018   Chronic Azathioprine and prednisone therapy for Devic's disease  . Pigmented skin lesion of uncertain nature 3/81/8299  . Shortness of breath    due to medication  . Skin lesion of chest wall 11/07/2018  . SLE (systemic lupus erythematosus related syndrome) ([place]) 11/07/2018  . Stroke [facility]) 2008   visually impaired    Past Surgical History: Past Surgical History:  Procedure Laterality Date  . BREAST BIOPSY    . BREAST CYST EXCISION Left pt unsure  . COLONOSCOPY WITH PROPOFOL N/A 07/26/2017   Procedure: COLONOSCOPY WITH PROPOFOL;  Surgeon: Carol Ada, MD;  Location: [facility];  Service: Endoscopy;  Laterality: N/A;  . ESOPHAGOGASTRODUODENOSCOPY (EGD) WITH PROPOFOL N/A 07/26/2017   Procedure: ESOPHAGOGASTRODUODENOSCOPY (EGD) WITH PROPOFOL;  Surgeon: Carol Ada, MD;  Location: [facility];  Service: Endoscopy;  Laterality: N/A;  . PORTA CATH INSERTION    . TUBAL LIGATION    . TUBAL LIGATION     Medication List:  Current Outpatient Medications  Medication Sig Dispense Refill  . alendronate (FOSAMAX) 70 MG tablet TAKE 1 TABLET BY MOUTH IN THE MORNING EVERY 7 DAYS 12 tablet 1  . azaTHIOprine (IMURAN) 50 MG tablet Take 50 mg by mouth 2 (two) times daily.     . Chlorpheniramine-Phenylephrine 4-10 MG tablet Take 1 tablet by mouth every 6 hours, if taking cough syrup. 20 tablet 1  . cyclobenzaprine (FLEXERIL) 10 MG tablet TAKE 1 TABLET (10 MG TOTAL) BY MOUTH NIGHTLY AS NEEDED FOR UP TO 30 DAYS FOR MUSCLE SPASMS.    . DULoxetine (CYMBALTA) 60 MG capsule Take 1 capsule (60 mg total) by mouth daily. 90 capsule 3  . esomeprazole (NEXIUM) 40 MG capsule TAKE 1 CAPSULE BY MOUTH TWICE A DAY 180 capsule 1  . fluticasone (FLONASE) 50 MCG/ACT nasal spray Place into the nose.    Marland Kitchen ipratropium (ATROVENT) 0.03 % nasal spray Place 2 sprays into both nostrils every 12 (twelve) hours. 30 mL 12  . LINZESS 145 MCG CAPS capsule TAKE ONE CAPSULE EVERY DAY ON EMPTY STOMACH AT LEAST 30MIN BEFORE 1ST  MEAL OF THE DAY 90 capsule 1  . predniSONE (DELTASONE) 10 MG tablet TAKE 1 TABLET BY MOUTH TWICE A DAY FOR 3 DAYS 90 tablet 1  . pregabalin (LYRICA) 200 MG capsule TAKE 1 CAPSULE BY MOUTH THREE TIMES A DAY 90 capsule 0  . RiTUXimab (RITUXAN IV) Inject 1,000 mg into the vein.    . rivaroxaban (XARELTO) 20 MG TABS tablet Take 1  tablet (20 mg total) by mouth daily. 90 tablet 1  . traMADol (ULTRAM) 50 MG tablet Take 1 tablet (50 mg total) by mouth every 12 (twelve) hours as needed. 60 tablet 5  . Vitamin D, Ergocalciferol, (DRISDOL) 1.25 MG (50000 UT) CAPS capsule TAKE 1 CAPSULE (50,000 UNITS TOTAL) BY MOUTH EVERY 7 (SEVEN) DAYS. 12 capsule 0  . guaiFENesin-codeine 100-10 MG/5ML syrup Take 10 mLs by mouth every 6 (six) hours as needed for cough. 236 mL 0  . LORazepam (ATIVAN) 0.5 MG tablet Take 1 tablet (0.5 mg total) by mouth 2 (two) times daily as needed for anxiety. 30 tablet 5  . OXcarbazepine (TRILEPTAL) 150 MG tablet Take 150 mg by mouth 3 (three) times daily.     No current facility-administered medications for this visit.    Allergies: No Known Allergies Social History: Social History   Socioeconomic History  . Marital status: Single    Spouse name: Not on file  . Number of children: Not on file  . Years of education: Not on file  . Highest education level: Not on file  Occupational History  . Occupation: disability  Social Needs  . Financial resource strain: Not hard at all  . Food insecurity:    Worry: Never true    Inability: Never true  . Transportation needs:    Medical: No    Non-medical: No  Tobacco Use  . Smoking status: Never Smoker  . Smokeless tobacco: Never Used  Substance and Sexual Activity  . Alcohol use: No  . Drug use: No  . Sexual activity: Not on file  Lifestyle  . Physical activity:    Days per week: 2 days    Minutes per session: 30 min  . Stress: Not at all  Relationships  . Social connections:    Talks on phone: Not on file    Gets together: Not on file    Attends religious service: Not on file    Active member of club or organization: Not on file    Attends meetings of clubs or organizations: Not on file    Relationship status: Not on file  Other Topics Concern  . Not on file  Social History Narrative   Patient lives with son [name] [phone])  as of 10/2018   Support from family   Frances [place] the following services: Food benefits, Social Security [place], [place] and [place] with  [place] (10/2018).    Involved in her church and recives support from her church   Insufficient physical activity   Lives in a home with her son. Smoking: denies Occupation: unemployed  Environmental HistoryFreight forwarder in the house: no Charity fundraiser in the family room: yes Carpet in the bedroom: yes Heating: gas Cooling: window Pet: no  Family History: Family History  Problem Relation Age of Onset  . Cancer Mother   . Cancer Father   . Lupus Sister    Problem  Relation Asthma                                   No  Eczema                                No  Food allergy                          No  Allergic rhino conjunctivitis     No   Review of Systems  Constitutional: Negative for appetite change, chills, fever and unexpected weight change.  HENT: Negative for congestion and rhinorrhea.   Eyes: Negative for itching.  Respiratory: Positive for cough. Negative for chest tightness, shortness of breath and wheezing.   Cardiovascular: Negative for chest pain.  Gastrointestinal: Negative for abdominal pain.  Genitourinary: Negative for difficulty urinating.  Skin: Positive for rash.  Allergic/Immunologic: Negative for environmental allergies and food allergies.  Neurological: Negative for headaches.   Objective: BP 138/84   Pulse 76   Temp 98.8 F (37.1 C)   Resp 16   Ht 5\' 1"  (1.549 m)   Wt 189 lb (85.7 kg)   BMI 35.71 kg/m  Body mass index is 35.71 kg/m. Physical Exam  Constitutional: She is oriented to person, place, and time. She appears well-developed and well-nourished.  HENT:  Head: Normocephalic and atraumatic.  Right Ear: External ear normal.  Left Ear: External ear normal.  Nose: Nose normal.  Mouth/Throat: Oropharynx is clear and moist.  Eyes:  Conjunctivae and EOM are normal.  Neck: Neck supple.  Cardiovascular: Normal rate and regular rhythm. Exam reveals no gallop and no friction rub.  Murmur heard. Pulmonary/Chest: Effort normal and breath sounds normal. She has no wheezes. She has no rales.  Abdominal: Soft.  Neurological: She is alert and oriented to person, place, and time.  Skin: Skin is warm. No rash noted.  Psychiatric: She has a normal mood and affect. Her behavior is normal.  Nursing note and vitals reviewed.  The plan was reviewed with the patient/family, and all questions/concerned were addressed.  It was my pleasure to see Myrth today and participate in her care. Please feel free to contact me with any questions or concerns.  Sincerely,  Rexene Alberts, DO Allergy & Immunology  [facility] office: [phone] [facility] office: [phone]

## 2019-01-07 NOTE — Patient Instructions (Addendum)
   Today's skin testing was negative to environmental allergy panel, and basic foods however if skin testing positive control was borderline questioning the validity of the results. Stometimes this can happen in chronic prednisone use patients.  Get blood work - CBC diff, IgG, IgA, IgM, s pneumo, tetanus and diptheria titers, zone 2.   Coughing:  Not sure what's triggering this cough.  Continue Flonase 1-2 sprays daily.  Continue Atrovent 2 sprays twice a day for runny nose.  Continue Nexium 40mg  twice a day.  Continue prednisone 10mg  daily.   Will do a trial of inhaler  Start Dulera 100 2 puffs twice a day with spacer and rinse mouth afterwards. Do this for 1 month and let me know how the coughing is.   May need pulmonology referral in the future.   Follow up in 1 month

## 2019-01-14 ENCOUNTER — Ambulatory Visit: Payer: Medicare Other | Admitting: Nurse Practitioner

## 2019-01-20 ENCOUNTER — Encounter: Payer: Self-pay | Admitting: Family Medicine

## 2019-01-27 ENCOUNTER — Other Ambulatory Visit: Payer: Self-pay | Admitting: Family Medicine

## 2019-01-27 DIAGNOSIS — R05 Cough: Secondary | ICD-10-CM

## 2019-01-27 DIAGNOSIS — J329 Chronic sinusitis, unspecified: Secondary | ICD-10-CM

## 2019-01-27 DIAGNOSIS — R053 Chronic cough: Secondary | ICD-10-CM

## 2019-01-27 LAB — IGE+ALLERGENS ZONE 2(30)
Alternaria Alternata IgE: <0.1 kU/L
Amer Sycamore IgE Qn: <0.1 kU/L
Aspergillus Fumigatus IgE: <0.1 kU/L
Bermuda Grass IgE: <0.1 kU/L
Cat Dander IgE: <0.1 kU/L
Cedar, Mountain IgE: <0.1 kU/L
Cladosporium Herbarum IgE: <0.1 kU/L
Cockroach, American IgE: <0.1 kU/L
D Farinae IgE: <0.1 kU/L
D Pteronyssinus IgE: <0.1 kU/L
Dog Dander IgE: <0.1 kU/L
Elm, American IgE: <0.1 kU/L
IgE (Immunoglobulin E), Serum: <2 IU/mL — ABNORMAL LOW (ref 6–495)
Maple/Box Elder IgE: <0.1 kU/L
Nettle IgE: <0.1 kU/L
Oak, White IgE: <0.1 kU/L
Penicillium Chrysogen IgE: <0.1 kU/L
Pigweed, Rough IgE: <0.1 kU/L
Plantain, English IgE: <0.1 kU/L
Ragweed, Short IgE: <0.1 kU/L
Sweet gum IgE RAST Ql: <0.1 kU/L
Timothy Grass IgE: <0.1 kU/L

## 2019-01-27 LAB — STREP PNEUMONIAE 23 SEROTYPES IGG
Pneumo Ab Type 1*: <0.1 ug/mL — ABNORMAL LOW (ref >1.3)
Pneumo Ab Type 12 (12F)*: <0.1 ug/mL — ABNORMAL LOW (ref >1.3)
Pneumo Ab Type 14*: 3 ug/mL (ref >1.3)
Pneumo Ab Type 17 (17F)*: <0.1 ug/mL — ABNORMAL LOW (ref >1.3)
Pneumo Ab Type 19 (19F)*: 1.3 ug/mL — ABNORMAL LOW (ref >1.3)
Pneumo Ab Type 2*: 0.6 ug/mL — ABNORMAL LOW (ref >1.3)
Pneumo Ab Type 20*: <0.1 ug/mL — ABNORMAL LOW (ref >1.3)
Pneumo Ab Type 22 (22F)*: <0.1 ug/mL — ABNORMAL LOW (ref >1.3)
Pneumo Ab Type 23 (23F)*: <0.1 ug/mL — ABNORMAL LOW (ref >1.3)
Pneumo Ab Type 26 (6B)*: <0.1 ug/mL — ABNORMAL LOW (ref >1.3)
Pneumo Ab Type 3*: 1.2 ug/mL — ABNORMAL LOW (ref >1.3)
Pneumo Ab Type 34 (10A)*: <0.1 ug/mL — ABNORMAL LOW (ref >1.3)
Pneumo Ab Type 4*: <0.1 ug/mL — ABNORMAL LOW (ref >1.3)
Pneumo Ab Type 43 (11A)*: <0.1 ug/mL — ABNORMAL LOW (ref >1.3)
Pneumo Ab Type 5*: <0.1 ug/mL — ABNORMAL LOW (ref >1.3)
Pneumo Ab Type 51 (7F)*: <0.1 ug/mL — ABNORMAL LOW (ref >1.3)
Pneumo Ab Type 54 (15B)*: <0.1 ug/mL — ABNORMAL LOW (ref >1.3)
Pneumo Ab Type 56 (18C)*: <0.1 ug/mL — ABNORMAL LOW (ref >1.3)
Pneumo Ab Type 57 (19A)*: 0.8 ug/mL — ABNORMAL LOW (ref >1.3)
Pneumo Ab Type 68 (9V)*: <0.1 ug/mL — ABNORMAL LOW (ref >1.3)
Pneumo Ab Type 70 (33F)*: 0.4 ug/mL — ABNORMAL LOW (ref >1.3)
Pneumo Ab Type 8*: <0.1 ug/mL — ABNORMAL LOW (ref >1.3)
Pneumo Ab Type 9 (9N)*: <0.1 ug/mL — ABNORMAL LOW (ref >1.3)

## 2019-01-27 LAB — CBC WITH DIFFERENTIAL/PLATELET
Basophils Absolute: 0 10*3/uL (ref 0.0–0.2)
Basos: 0 %
EOS (ABSOLUTE): 0.1 10*3/uL (ref 0.0–0.4)
Eos: 1 %
Hematocrit: 36.1 % (ref 34.0–46.6)
Hemoglobin: 11.1 g/dL (ref 11.1–15.9)
Immature Grans (Abs): 0.1 10*3/uL (ref 0.0–0.1)
Immature Granulocytes: 1 %
Lymphocytes Absolute: 1.8 10*3/uL (ref 0.7–3.1)
Lymphs: 32 %
MCH: 22.3 pg — ABNORMAL LOW (ref 26.6–33.0)
MCHC: 30.7 g/dL — ABNORMAL LOW (ref 31.5–35.7)
MCV: 73 fL — ABNORMAL LOW (ref 79–97)
Monocytes Absolute: 0.4 10*3/uL (ref 0.1–0.9)
Monocytes: 7 %
Neutrophils Absolute: 3.3 10*3/uL (ref 1.4–7.0)
Neutrophils: 59 %
Platelets: 282 10*3/uL (ref 150–450)
RBC: 4.97 x10E6/uL (ref 3.77–5.28)
RDW: 15.9 % — ABNORMAL HIGH (ref 11.7–15.4)
WBC: 5.6 10*3/uL (ref 3.4–10.8)

## 2019-01-27 LAB — DIPHTHERIA / TETANUS ANTIBODY PANEL
Diphtheria Ab: <0.1 IU/mL — ABNORMAL LOW (ref <0.10)
Tetanus Ab, IgG: 1.28 IU/mL (ref <0.10)

## 2019-01-27 LAB — IGG, IGA, IGM
IgA/Immunoglobulin A, Serum: 137 mg/dL (ref 87–352)
IgG (Immunoglobin G), Serum: 879 mg/dL (ref 586–1602)
IgM (Immunoglobulin M), Srm: 15 mg/dL — ABNORMAL LOW (ref 26–217)

## 2019-01-30 ENCOUNTER — Telehealth: Payer: Self-pay | Admitting: *Deleted

## 2019-01-30 NOTE — Telephone Encounter (Signed)
error 

## 2019-02-04 ENCOUNTER — Other Ambulatory Visit: Payer: Self-pay

## 2019-02-04 ENCOUNTER — Ambulatory Visit (INDEPENDENT_AMBULATORY_CARE_PROVIDER_SITE_OTHER): Payer: Medicare Other | Admitting: Allergy

## 2019-02-04 ENCOUNTER — Encounter: Payer: Self-pay | Admitting: Allergy

## 2019-02-04 DIAGNOSIS — Z7952 Long term (current) use of systemic steroids: Secondary | ICD-10-CM

## 2019-02-04 DIAGNOSIS — J310 Chronic rhinitis: Secondary | ICD-10-CM | POA: Diagnosis not present

## 2019-02-04 DIAGNOSIS — R05 Cough: Secondary | ICD-10-CM

## 2019-02-04 DIAGNOSIS — R053 Chronic cough: Secondary | ICD-10-CM

## 2019-02-04 DIAGNOSIS — Z130 Encounter for screening for diseases of the blood and blood-forming organs and certain disorders involving the immune mechanism: Secondary | ICD-10-CM

## 2019-02-04 MED ORDER — MOMETASONE FURO-FORMOTEROL FUM 100-5 MCG/ACT IN AERO: 2.0000 | INHALATION_SPRAY | Freq: Two times a day (BID) | RESPIRATORY_TRACT | 5 refills | Status: DC

## 2019-02-04 NOTE — Progress Notes (Signed)
Start 1055 Location out of state in a secure area End 1123

## 2019-02-04 NOTE — Progress Notes (Signed)
RE: Andrea Berry MRN: [id number] DOB: [birth year] Date of Telemedicine Visit: 02/04/2019  Referring provider: McDiarmid, Andrea Ohara, MD Primary care provider: McDiarmid, Andrea Ohara, MD  Chief Complaint: Cough   Telemedicine Follow Up Visit via Telephone: I connected with Andrea Berry for a follow up on 02/04/19 by telephone and verified that I am speaking with the correct person using two identifiers.   I discussed the limitations, risks, security and privacy concerns of performing an evaluation and management service by telephone and the availability of in person appointments. I also discussed with the patient that there may be a patient responsible charge related to this service. The patient expressed understanding and agreed to proceed.  Patient is at out of state visiting son. Provider is at the office.  Visit start time: 10:55AM Visit end time: 11:23AM Insurance consent/check in by: [name] Medical consent and medical assistant/nurse: Sheliah Plane.  History of Present Illness: She is a 53 y.o. female, who is being followed for chronic cough, rhinitis. Her previous allergy office visit was on 01/07/2019 with Dr. Maudie Berry. Today is a regular follow up visit.  Chronic coughing Currently on Dulera 100 2 puffs twice a day and not coughing anymore. The coughing stopped a few weeks ago and patient not sure if it was the antibiotics or the [facility] that helped.  No albuterol use since the last visit. Still on Nexium 40mg  BID.   Rhinitis:  Using Flonase and Atrovent with good benefit.   Immune:  No recent tetanus and pneumovax vaccine. It has been about 8 years since the last injection. No additional antibiotics or infections since the last visit.   Assessment and Plan: [name] is a 53 y.o. female with: Chronic coughing Past history - Chronic coughing with some sputum production for 1 year. Worse during the fall and spring but no specific triggers noted. Patient had full PFT on 11/24/2018 which was  unremarkable. She tried antibiotics which help but then coughing returns once she stops. Started on Flonase, ipratropium with minimal benefit. Has reflux and takes Nexium. No previous inhaler trial. Co-morbidities include steroid dependent, SLE, devic's disease and strokes. 2020 skin testing showed: negative to environmental allergy panel, and basic foods however positive control was borderline questioning the validity of the results.  Interim history - 2020 immunocap was negative to environmental allergies - full panel was not run due to lack of sample but IgE was <2. No indication for repeat re-draw as my suspicion for environmental allergies are low.   Symptoms improved since last OV and no more coughing. Hold off pulmonology referral, cardiac work up and CXR/CT chest.  ? Continue Dulera 100 2 puffs twice a day with spacer and rinse mouth afterwards. Monitor symptoms. ? Get spirometry at next visit. ? May use albuterol rescue inhaler 2 puffs or nebulizer every 4 to 6 hours as needed for shortness of breath, chest tightness, coughing, and wheezing. May use albuterol rescue inhaler 2 puffs 5 to 15 minutes prior to strenuous physical activities. Monitor frequency of use.  ? Continue Nexium 40mg  twice a day.   Chronic rhinitis Past history: 2020 skin testing was negative to environmental allergy panel and bloodwork IgE <2, full panel not run due to lack of sample but no allergens noted.   Continue Flonase 1-2 sprays daily.  Continue Atrovent 2 sprays twice a day for runny nose/PND.  Encounter for screening for diseases of the blood and blood-forming organs and certain disorders involving the immune mechanism No additional infections/antibiotics use.  Bloodwork showed poor s pneumo titers and absent diptheria titer.  Keep track of infections.  Recommend getting pneumovax vaccine and Tdap booster.  Repeat titer bloodwork 4 weeks post vaccine to see if her immune system responded appropriately.    Return in about 4 weeks (around 03/04/2019).  Meds ordered this encounter  Medications  . mometasone-formoterol (DULERA) 100-5 MCG/ACT AERO    Sig: Inhale 2 puffs into the lungs 2 (two) times daily.    Dispense:  1 Inhaler    Refill:  5    Lab Orders     Strep pneumoniae 23 Serotypes IgG     Diphtheria / Tetanus Antibody Panel  Diagnostics: None.  Medication List:  Current Outpatient Medications  Medication Sig Dispense Refill  . alendronate (FOSAMAX) 70 MG tablet TAKE 1 TABLET BY MOUTH IN THE MORNING EVERY 7 DAYS 12 tablet 1  . azaTHIOprine (IMURAN) 50 MG tablet Take 50 mg by mouth 2 (two) times daily.     . [facility] SINUS PE/ALLERGY MAX ST 4-10 MG tablet TAKE 1 TABLET BY MOUTH EVERY 6 HOURS, IF TAKING COUGH SYRUP. 24 tablet 1  . cyclobenzaprine (FLEXERIL) 10 MG tablet TAKE 1 TABLET (10 MG TOTAL) BY MOUTH NIGHTLY AS NEEDED FOR UP TO 30 DAYS FOR MUSCLE SPASMS.    . DULoxetine (CYMBALTA) 60 MG capsule Take 1 capsule (60 mg total) by mouth daily. 90 capsule 3  . esomeprazole (NEXIUM) 40 MG capsule TAKE 1 CAPSULE BY MOUTH TWICE A DAY 180 capsule 1  . fluticasone (FLONASE) 50 MCG/ACT nasal spray Place into the nose.    Marland Kitchen guaiFENesin-codeine 100-10 MG/5ML syrup Take 10 mLs by mouth every 6 (six) hours as needed for cough. 236 mL 0  . ipratropium (ATROVENT) 0.03 % nasal spray Place 2 sprays into both nostrils every 12 (twelve) hours. 30 mL 12  . LINZESS 145 MCG CAPS capsule TAKE ONE CAPSULE EVERY DAY ON EMPTY STOMACH AT LEAST 30MIN BEFORE 1ST MEAL OF THE DAY 90 capsule 1  . LORazepam (ATIVAN) 0.5 MG tablet Take 1 tablet (0.5 mg total) by mouth 2 (two) times daily as needed for anxiety. 30 tablet 5  . OXcarbazepine (TRILEPTAL) 150 MG tablet Take 150 mg by mouth 3 (three) times daily.    . pregabalin (LYRICA) 200 MG capsule TAKE 1 CAPSULE BY MOUTH THREE TIMES A DAY 90 capsule 0  . RiTUXimab (RITUXAN IV) Inject 1,000 mg into the vein.    . rivaroxaban (XARELTO) 20 MG TABS tablet Take 1 tablet (20  mg total) by mouth daily. 90 tablet 1  . traMADol (ULTRAM) 50 MG tablet Take 1 tablet (50 mg total) by mouth every 12 (twelve) hours as needed. 60 tablet 5  . triamcinolone cream (KENALOG) 0.1 % APPLY TO AFFECTED AREA ON CHEST ONCE TO TWICE A DAY (AVOID FACE,GROIN,AXILLA)    . mometasone-formoterol (DULERA) 100-5 MCG/ACT AERO Inhale 2 puffs into the lungs 2 (two) times daily. 1 Inhaler 5   No current facility-administered medications for this visit.    Allergies: No Known Allergies I reviewed her past medical history, social history, family history, and environmental history and no significant changes have been reported from previous visit on 01/07/2019.  Review of Systems  Constitutional: Negative for appetite change, chills, fever and unexpected weight change.  HENT: Negative for congestion and rhinorrhea.   Eyes: Negative for itching.  Respiratory: Negative for cough, chest tightness, shortness of breath and wheezing.   Cardiovascular: Negative for chest pain.  Gastrointestinal: Negative for abdominal pain.  Genitourinary: Negative for difficulty urinating.  Skin: Positive for rash.  Allergic/Immunologic: Negative for environmental allergies and food allergies.  Neurological: Negative for headaches.   Objective: Physical Exam   Not obtained as encounter was done via telephone. No coughing during phone conversation.   Previous notes and tests were reviewed.  I discussed the assessment and treatment plan with the patient. The patient was provided an opportunity to ask questions and all were answered. The patient agreed with the plan and demonstrated an understanding of the instructions. After visit summary/patient instructions available via mail.   The patient was advised to call back or seek an in-person evaluation if the symptoms worsen or if the condition fails to improve as anticipated.  I provided 25 minutes of non-face-to-face time during this encounter.  It was my pleasure  to participate in Margie Urbanowicz care today. Please feel free to contact me with any questions or concerns.   Sincerely,  Rexene Alberts, DO Allergy & Immunology  [facility] office: [phone] [facility] office: [phone]

## 2019-02-04 NOTE — Assessment & Plan Note (Signed)
Past history: 2020 skin testing was negative to environmental allergy panel and bloodwork IgE <2, full panel not run due to lack of sample but no allergens noted.   Continue Flonase 1-2 sprays daily.  Continue Atrovent 2 sprays twice a day for runny nose/PND.

## 2019-02-04 NOTE — Assessment & Plan Note (Signed)
Past history - Chronic coughing with some sputum production for 1 year. Worse during the fall and spring but no specific triggers noted. Patient had full PFT on 11/24/2018 which was unremarkable. She tried antibiotics which help but then coughing returns once she stops. Started on Flonase, ipratropium with minimal benefit. Has reflux and takes Nexium. No previous inhaler trial. Co-morbidities include steroid dependent, SLE, devic's disease and strokes. 2020 skin testing showed: negative to environmental allergy panel, and basic foods however positive control was borderline questioning the validity of the results.  Interim history - 2020 immunocap was negative to environmental allergies - full panel was not run due to lack of sample but IgE was <2. No indication for repeat re-draw as my suspicion for environmental allergies are low.   Symptoms improved since last OV and no more coughing. Hold off pulmonology referral, cardiac work up and CXR/CT chest.  ? Continue Dulera 100 2 puffs twice a day with spacer and rinse mouth afterwards. Monitor symptoms. ? Get spirometry at next visit. ? May use albuterol rescue inhaler 2 puffs or nebulizer every 4 to 6 hours as needed for shortness of breath, chest tightness, coughing, and wheezing. May use albuterol rescue inhaler 2 puffs 5 to 15 minutes prior to strenuous physical activities. Monitor frequency of use.  ? Continue Nexium 40mg  twice a day.

## 2019-02-04 NOTE — Patient Instructions (Addendum)
Chronic coughing  Improved. ? Continue Dulera 100 2 puffs twice a day with spacer and rinse mouth afterwards. Monitor symptoms. ? Get spirometry at next visit. ? May use albuterol rescue inhaler 2 puffs or nebulizer every 4 to 6 hours as needed for shortness of breath, chest tightness, coughing, and wheezing. May use albuterol rescue inhaler 2 puffs 5 to 15 minutes prior to strenuous physical activities. Monitor frequency of use.  ? Continue Nexium 40mg  twice a day.   Immune  Get pneumovax and Tdap vaccine at your PCP's office.  Get bloodwork 4-6 weeks after your vaccine.  Keep track of infections.  Chronic rhinitis  Continue Flonase 1-2 sprays daily.  Continue Atrovent 2 sprays twice a day for runny nose/PND  Follow up in 4 weeks

## 2019-02-04 NOTE — Assessment & Plan Note (Signed)
No additional infections/antibiotics use. Bloodwork showed poor s pneumo titers and absent diptheria titer.  Keep track of infections.  Recommend getting pneumovax vaccine and Tdap booster.  Repeat titer bloodwork 4 weeks post vaccine to see if her immune system responded appropriately.

## 2019-02-14 ENCOUNTER — Other Ambulatory Visit: Payer: Self-pay | Admitting: Family Medicine

## 2019-02-14 DIAGNOSIS — M329 Systemic lupus erythematosus, unspecified: Secondary | ICD-10-CM

## 2019-02-14 DIAGNOSIS — Z7952 Long term (current) use of systemic steroids: Secondary | ICD-10-CM

## 2019-03-02 ENCOUNTER — Other Ambulatory Visit: Payer: Self-pay | Admitting: Nurse Practitioner

## 2019-03-03 ENCOUNTER — Ambulatory Visit
Admission: RE | Admit: 2019-03-03 | Discharge: 2019-03-03 | Disposition: A | Discharge status: Home / Self Care | Payer: Medicare Other | Source: Ambulatory Visit | Attending: Family Medicine | Admitting: Family Medicine

## 2019-03-03 ENCOUNTER — Other Ambulatory Visit: Payer: Self-pay

## 2019-03-03 DIAGNOSIS — Z7952 Long term (current) use of systemic steroids: Secondary | ICD-10-CM

## 2019-03-03 DIAGNOSIS — Z1231 Encounter for screening mammogram for malignant neoplasm of breast: Secondary | ICD-10-CM

## 2019-03-05 ENCOUNTER — Encounter: Payer: Self-pay | Admitting: Allergy

## 2019-03-05 ENCOUNTER — Ambulatory Visit (INDEPENDENT_AMBULATORY_CARE_PROVIDER_SITE_OTHER): Payer: Medicare Other | Admitting: Allergy

## 2019-03-05 ENCOUNTER — Other Ambulatory Visit: Payer: Self-pay

## 2019-03-05 VITALS — BP 156/96 | HR 97 | Temp 98.2°F | Resp 16

## 2019-03-05 DIAGNOSIS — R05 Cough: Secondary | ICD-10-CM | POA: Diagnosis not present

## 2019-03-05 DIAGNOSIS — J310 Chronic rhinitis: Secondary | ICD-10-CM | POA: Diagnosis not present

## 2019-03-05 DIAGNOSIS — Z130 Encounter for screening for diseases of the blood and blood-forming organs and certain disorders involving the immune mechanism: Secondary | ICD-10-CM

## 2019-03-05 DIAGNOSIS — R053 Chronic cough: Secondary | ICD-10-CM

## 2019-03-05 MED ORDER — AZELASTINE HCL 0.1 % NA SOLN: 2.0000 | Freq: Two times a day (BID) | NASAL | 5 refills | Status: DC

## 2019-03-05 NOTE — Assessment & Plan Note (Signed)
Past history: 2020 skin testing was negative to environmental allergy panel and bloodwork IgE <2, full panel not run due to lack of sample but no allergens noted.  Interim history - PND especially in the mornings.   Continue Flonase 1-2 sprays daily.  Stop Atrovent and start azelastine 2 sprays twice a day for post nasal drip.  Nasal saline spray (i.e., Simply Saline) or nasal saline lavage (i.e., NeilMed) is recommended as needed and prior to medicated nasal sprays.

## 2019-03-05 NOTE — Patient Instructions (Addendum)
Chronic coughing ? Continue Dulera 100 2 puffs twice a day with spacer and rinse mouth afterwards.Monitor symptoms. ? May use albuterol rescue inhaler 2 puffs or nebulizer every 4 to 6 hours as needed for shortness of breath, chest tightness, coughing, and wheezing. May use albuterol rescue inhaler 2 puffs 5 to 15 minutes prior to strenuous physical activities. Monitor frequency of use.  ? Continue Nexium 40mg  twice a day.   Chronic rhinitis  Continue Flonase 1-2 sprays daily.  Stop Atrovent and start azelastine 2 sprays twice a day for post nasal drip.  Nasal saline spray (i.e., Simply Saline) or nasal saline lavage (i.e., NeilMed) is recommended as needed and prior to medicated nasal sprays.  Encounter for screening for diseases of the blood and blood-forming organs and certain disorders involving the immune mechanism  Keep track of infections.  Recommend getting pneumovax vaccine and Tdap booster.  Repeat titer bloodwork 4 weeks post vaccine to see if her immune system responded appropriately.   Follow up in 2 months

## 2019-03-05 NOTE — Assessment & Plan Note (Signed)
Past history - Bloodwork showed poor s pneumo titers and absent diptheria titer. Interim history - No additional infections/antibiotics use.  Keep track of infections.  Recommend getting pneumovax vaccine and Tdap booster.  Repeat titer bloodwork 4 weeks post vaccine to see if her immune system responded appropriately.

## 2019-03-05 NOTE — Assessment & Plan Note (Signed)
Past history - Chronic coughing with some sputum production for 1 year. Worse during the fall and spring but no specific triggers noted. Patient had full PFT on 11/24/2018 which was unremarkable. She tried antibiotics which help but then coughing returns once she stops. Started on Flonase, ipratropium with minimal benefit. Has reflux and takes Nexium. Co-morbidities include steroid dependent, SLE, devic's disease and strokes. 2020 skin testing showed: negative to environmental allergy panel, and basic foods however positive control was borderline questioning the validity of the results. 2020 immunocap was negative to environmental allergies - full panel was not run due to lack of sample but IgE was <2. No indication for repeat re-draw as my suspicion for environmental allergies are low.  Interim history - Still having coughing and phlegm in the mornings. ? Continue Dulera 100 2 puffs twice a day with spacer and rinse mouth afterwards.Monitor symptoms. ? May use albuterol rescue inhaler 2 puffs or nebulizer every 4 to 6 hours as needed for shortness of breath, chest tightness, coughing, and wheezing. May use albuterol rescue inhaler 2 puffs 5 to 15 minutes prior to strenuous physical activities. Monitor frequency of use.  ? Continue Nexium 40mg  twice a day.  ? Concern if coughing and phlegm is due to post nasal drip. See plan for chronic rhinitis as below. ? If still persistent at next visit consider pulmonology referral, cardiac work up and/or CXR/CT chest. ? Will get spirometry at next visit instead of today due to COVID-19 pandemic and trying to minimize any type of aerosolizing procedures at this time in the office.

## 2019-03-05 NOTE — Progress Notes (Signed)
Follow Up Note  RE: Andrea Berry MRN: [id number] DOB: Nov 25, 1965 Date of Office Visit: 03/05/2019  Referring provider: McDiarmid, Andrea Ohara, MD Primary care provider: McDiarmid, Andrea Ohara, MD  Chief Complaint: No chief complaint on file.  History of Present Illness: I had the pleasure of seeing Andrea Berry for a follow up visit at the [facility] on 03/05/2019. She is a 53 y.o. female, who is being followed for coughing, rhinitis, frequent infections. Today she is here for regular follow up visit. Her previous allergy office visit was on 02/04/2019 with Dr. Maudie Berry. Patient is accompanied by her daughter in law who is also her aid.  Chronic coughing Still has some coughing and phlegm in the mornings and describe it as thick. Not sure what color it is as she is blind.  Currently using Dulera 100 2 puffs twice a day which is helping. Not had to use albuterol.  Takes Nexium 40mg  BID.   Chronic rhinitis Currently on Flonase 1-2 sprays BID and Atrovent 2 sprays twice a day. Not sure if it's helping with PND.   Encounter for screening for diseases of the blood and blood-forming organs and certain disorders involving the immune mechanism No additional infections or antibiotics.  Did not get pneumovax and tetanus booster yet.   Assessment and Plan: Andrea Berry is a 53 y.o. female with: Chronic coughing Past history - Chronic coughing with some sputum production for 1 year. Worse during the fall and spring but no specific triggers noted. Patient had full PFT on 11/24/2018 which was unremarkable. She tried antibiotics which help but then coughing returns once she stops. Started on Flonase, ipratropium with minimal benefit. Has reflux and takes Nexium. Co-morbidities include steroid dependent, SLE, devic's disease and strokes. 2020 skin testing showed: negative to environmental allergy panel, and basic foods however positive control was borderline questioning the validity of the results. 2020  immunocap was negative to environmental allergies - full panel was not run due to lack of sample but IgE was <2. No indication for repeat re-draw as my suspicion for environmental allergies are low.  Interim history - Still having coughing and phlegm in the mornings. ? Continue Dulera 100 2 puffs twice a day with spacer and rinse mouth afterwards.Monitor symptoms. ? May use albuterol rescue inhaler 2 puffs or nebulizer every 4 to 6 hours as needed for shortness of breath, chest tightness, coughing, and wheezing. May use albuterol rescue inhaler 2 puffs 5 to 15 minutes prior to strenuous physical activities. Monitor frequency of use.  ? Continue Nexium 40mg  twice a day.  ? Concern if coughing and phlegm is due to post nasal drip. See plan for chronic rhinitis as below. ? If still persistent at next visit consider pulmonology referral, cardiac work up and/or CXR/CT chest. ? Will get spirometry at next visit instead of today due to COVID-19 pandemic and trying to minimize any type of aerosolizing procedures at this time in the office.   Chronic rhinitis Past history: 2020 skin testing was negative to environmental allergy panel and bloodwork IgE <2, full panel not run due to lack of sample but no allergens noted.  Interim history - PND especially in the mornings.   Continue Flonase 1-2 sprays daily.  Stop Atrovent and start azelastine 2 sprays twice a day for post nasal drip.  Nasal saline spray (i.e., Simply Saline) or nasal saline lavage (i.e., NeilMed) is recommended as needed and prior to medicated nasal sprays.  Encounter for screening for  diseases of the blood and blood-forming organs and certain disorders involving the immune mechanism Past history - Bloodwork showed poor s pneumo titers and absent diptheria titer. Interim history - No additional infections/antibiotics use.  Keep track of infections.  Recommend getting pneumovax vaccine and Tdap booster.  Repeat titer bloodwork 4  weeks post vaccine to see if her immune system responded appropriately.   Return in about 2 months (around 05/05/2019).  Meds ordered this encounter  Medications  . azelastine (ASTELIN) 0.1 % nasal spray    Sig: Place 2 sprays into both nostrils 2 (two) times daily.    Dispense:  30 mL    Refill:  5   Diagnostics: None.  Medication List:  Current Outpatient Medications  Medication Sig Dispense Refill  . alendronate (FOSAMAX) 70 MG tablet TAKE 1 TABLET BY MOUTH IN THE MORNING EVERY 7 DAYS 12 tablet 1  . azaTHIOprine (IMURAN) 50 MG tablet Take 50 mg by mouth 2 (two) times daily.     . [facility] SINUS PE/ALLERGY MAX ST 4-10 MG tablet TAKE 1 TABLET BY MOUTH EVERY 6 HOURS, IF TAKING COUGH SYRUP. 24 tablet 1  . cyclobenzaprine (FLEXERIL) 10 MG tablet TAKE 1 TABLET (10 MG TOTAL) BY MOUTH NIGHTLY AS NEEDED FOR UP TO 30 DAYS FOR MUSCLE SPASMS.    . DULoxetine (CYMBALTA) 60 MG capsule Take 1 capsule (60 mg total) by mouth daily. 90 capsule 3  . esomeprazole (NEXIUM) 40 MG capsule TAKE 1 CAPSULE BY MOUTH TWICE A DAY 180 capsule 1  . fluticasone (FLONASE) 50 MCG/ACT nasal spray Place into the nose.    Marland Kitchen guaiFENesin-codeine 100-10 MG/5ML syrup Take 10 mLs by mouth every 6 (six) hours as needed for cough. 236 mL 0  . LINZESS 145 MCG CAPS capsule TAKE ONE CAPSULE EVERY DAY ON EMPTY STOMACH AT LEAST 30MIN BEFORE 1ST MEAL OF THE DAY 90 capsule 1  . LORazepam (ATIVAN) 0.5 MG tablet Take 1 tablet (0.5 mg total) by mouth 2 (two) times daily as needed for anxiety. 30 tablet 5  . mometasone-formoterol (DULERA) 100-5 MCG/ACT AERO Inhale 2 puffs into the lungs 2 (two) times daily. 1 Inhaler 5  . OXcarbazepine (TRILEPTAL) 150 MG tablet Take 150 mg by mouth 3 (three) times daily.    . predniSONE (DELTASONE) 10 MG tablet TAKE 1 TABLET BY MOUTH TWICE A DAY FOR 3 DAYS 90 tablet 1  . pregabalin (LYRICA) 200 MG capsule TAKE 1 CAPSULE BY MOUTH THREE TIMES A DAY 90 capsule 0  . RiTUXimab (RITUXAN IV) Inject 1,000 mg into the  vein.    . rivaroxaban (XARELTO) 20 MG TABS tablet Take 1 tablet (20 mg total) by mouth daily. 90 tablet 1  . traMADol (ULTRAM) 50 MG tablet Take 1 tablet (50 mg total) by mouth every 12 (twelve) hours as needed. 60 tablet 5  . azelastine (ASTELIN) 0.1 % nasal spray Place 2 sprays into both nostrils 2 (two) times daily. 30 mL 5  . triamcinolone cream (KENALOG) 0.1 % APPLY TO AFFECTED AREA ON CHEST ONCE TO TWICE A DAY (AVOID FACE,GROIN,AXILLA)     No current facility-administered medications for this visit.    Allergies: No Known Allergies I reviewed her past medical history, social history, family history, and environmental history and no significant changes have been reported from previous visit on 02/04/2019.  Review of Systems  Constitutional: Negative for appetite change, chills, fever and unexpected weight change.  HENT: Positive for postnasal drip. Negative for congestion and rhinorrhea.   Eyes:  Negative for itching.  Respiratory: Positive for cough. Negative for chest tightness, shortness of breath and wheezing.   Cardiovascular: Negative for chest pain.  Gastrointestinal: Negative for abdominal pain.  Genitourinary: Negative for difficulty urinating.  Skin: Negative for rash.  Allergic/Immunologic: Negative for environmental allergies and food allergies.  Neurological: Negative for headaches.   Objective: BP (!) 156/96   Pulse 97   Temp 98.2 F (36.8 C) (Temporal)   Resp 16   SpO2 100%  There is no height or weight on file to calculate BMI. Physical Exam  Constitutional: She is oriented to person, place, and time. She appears well-developed and well-nourished.  HENT:  Head: Normocephalic and atraumatic.  Right Ear: External ear normal.  Left Ear: External ear normal.  Nose: Nose normal.  Mouth/Throat: Oropharynx is clear and moist.  Eyes: Conjunctivae and EOM are normal.  Neck: Neck supple.  Cardiovascular: Normal rate, regular rhythm and normal heart sounds. Exam  reveals no gallop and no friction rub.  No murmur heard. Pulmonary/Chest: Effort normal and breath sounds normal. She has no wheezes. She has no rales.  Neurological: She is alert and oriented to person, place, and time.  Skin: Skin is warm. No rash noted.  Psychiatric: She has a normal mood and affect. Her behavior is normal.  Nursing note and vitals reviewed.  Previous notes and tests were reviewed. The plan was reviewed with the patient/family, and all questions/concerned were addressed.  It was my pleasure to see Kenyatta today and participate in her care. Please feel free to contact me with any questions or concerns.  Sincerely,  Rexene Alberts, DO Allergy & Immunology  [facility] office: [phone] [facility] office: [phone]

## 2019-03-10 LAB — HM DEXA SCAN

## 2019-03-23 ENCOUNTER — Encounter: Payer: Self-pay | Admitting: Family Medicine

## 2019-03-23 DIAGNOSIS — M8580 Other specified disorders of bone density and structure, unspecified site: Secondary | ICD-10-CM

## 2019-03-23 DIAGNOSIS — T380X5A Adverse effect of glucocorticoids and synthetic analogues, initial encounter: Secondary | ICD-10-CM | POA: Insufficient documentation

## 2019-03-23 HISTORY — DX: Other specified disorders of bone density and structure, unspecified site: M85.80

## 2019-03-23 LAB — ANA
ANA TITER: 1:640 {titer}
Complement C3, Serum: 172
Complement C4, Serum: 56
Ferritin: 70
Saturation Ratios: 19
TIBC: 344
Transferrin: 260
Vitamin D3 1, 25 (OH)2: 11

## 2019-03-23 NOTE — Progress Notes (Unsigned)
Initial Consultation with Rheumatology, Galen Manila, MD, [facility] 03/10/2019 note summary  A/ Lupus Devic's disease Long term use of blood thinners Microcytic anemia.  P/ 53 year old lady with hx of lupus and devic's disease Will establish baseline for her lupus She will need to discuss possibility of doing the hydrochlorquine with her ophthalmologist - has legal blindness  Per recommendations Drug holiday Drug holiday for 2 years to prevent atypical fractures from bisphosphonates longterm use.  -- recheck DEXA in 2 years as PSP has been doing and if numbers start to decline can restart.  Continue vitamin D and calcium  She is on rituximab for Devics that will also help with Lupus. Should have Shingrix w/ PCP I would like to ba able to taper her prednisone slowly to 5 mg to reduce risk long term of prednisone. She will likely no be able to come off and at this time is likely adrenal insufficient and should know that she will not be able to stop her steroids. Recommend retrun for follow up

## 2019-03-24 ENCOUNTER — Telehealth: Payer: Self-pay | Admitting: *Deleted

## 2019-03-24 NOTE — Telephone Encounter (Signed)
Having headaches every other day for one week. Throbbing headaches.  Pt denies blurred vision (legally blind), CP, SOB.  She needs advance notice for appts to get ride.  Appt made for firday.   She will call if headache reoccurs and does not subside or experiences any symptoms above. Christen Bame, CMA

## 2019-03-27 ENCOUNTER — Encounter: Payer: Self-pay | Admitting: Family Medicine

## 2019-03-27 ENCOUNTER — Other Ambulatory Visit: Payer: Self-pay

## 2019-03-27 ENCOUNTER — Ambulatory Visit (INDEPENDENT_AMBULATORY_CARE_PROVIDER_SITE_OTHER): Payer: Medicare Other | Admitting: Family Medicine

## 2019-03-27 VITALS — BP 140/80 | HR 99

## 2019-03-27 DIAGNOSIS — G43009 Migraine without aura, not intractable, without status migrainosus: Secondary | ICD-10-CM | POA: Diagnosis not present

## 2019-03-27 DIAGNOSIS — J80 Acute respiratory distress syndrome: Secondary | ICD-10-CM

## 2019-03-27 HISTORY — DX: Acute respiratory distress syndrome: J80

## 2019-03-27 MED ORDER — DEXAMETHASONE 6 MG PO TABS: 6.0000 mg | ORAL_TABLET | Freq: Every day | ORAL | 0 refills | Status: DC

## 2019-03-27 NOTE — Patient Instructions (Signed)
It was a pleasure meeting you!  1. You likely have a migraine headache. Try taking aleve again at night to prevent build up of the headache.  2. I have prescribed a steroid for 5 days. Take this once daily in the morning.  3. Please discuss this with your neurologist when you see them next.  Feel better!  Dr. Gladys Damme   Migraine Headache A migraine headache is a very strong throbbing pain on one side or both sides of your head. This type of headache can also cause other symptoms. It can last from 4 hours to 3 days. Talk with your doctor about what things may bring on (trigger) this condition. What are the causes? The exact cause of this condition is not known. This condition may be triggered or caused by:  Drinking alcohol.  Smoking.  Taking medicines, such as: ? Medicine used to treat chest pain (nitroglycerin). ? Birth control pills. ? Estrogen. ? Some blood pressure medicines.  Eating or drinking certain products.  Doing physical activity. Other things that may trigger a migraine headache include:  Having a menstrual period.  Pregnancy.  Hunger.  Stress.  Not getting enough sleep or getting too much sleep.  Weather changes.  Tiredness (fatigue). What increases the risk?  Being 78-65 years old.  Being female.  Having a family history of migraine headaches.  Being Caucasian.  Having depression or anxiety.  Being very overweight. What are the signs or symptoms?  A throbbing pain. This pain may: ? Happen in any area of the head, such as on one side or both sides. ? Make it hard to do daily activities. ? Get worse with physical activity. ? Get worse around bright lights or loud noises.  Other symptoms may include: ? Feeling sick to your stomach (nauseous). ? Vomiting. ? Dizziness. ? Being sensitive to bright lights, loud noises, or smells.  Before you get a migraine headache, you may get warning signs (an aura). An aura may include: ?  Seeing flashing lights or having blind spots. ? Seeing bright spots, halos, or zigzag lines. ? Having tunnel vision or blurred vision. ? Having numbness or a tingling feeling. ? Having trouble talking. ? Having weak muscles.  Some people have symptoms after a migraine headache (postdromal phase), such as: ? Tiredness. ? Trouble thinking (concentrating). How is this treated?  Taking medicines that: ? Relieve pain. ? Relieve the feeling of being sick to your stomach. ? Prevent migraine headaches.  Treatment may also include: ? Having acupuncture. ? Avoiding foods that bring on migraine headaches. ? Learning ways to control your body functions (biofeedback). ? Therapy to help you know and deal with negative thoughts (cognitive behavioral therapy). Follow these instructions at home: Medicines  Take over-the-counter and prescription medicines only as told by your doctor.  Ask your doctor if the medicine prescribed to you: ? Requires you to avoid driving or using heavy machinery. ? Can cause trouble pooping (constipation). You may need to take these steps to prevent or treat trouble pooping:  Drink enough fluid to keep your pee (urine) pale yellow.  Take over-the-counter or prescription medicines.  Eat foods that are high in fiber. These include beans, whole grains, and fresh fruits and vegetables.  Limit foods that are high in fat and sugar. These include fried or sweet foods. Lifestyle  Do not drink alcohol.  Do not use any products that contain nicotine or tobacco, such as cigarettes, e-cigarettes, and chewing tobacco. If you need help quitting,  ask your doctor.  Get at least 8 hours of sleep every night.  Limit and deal with stress. General instructions      Keep a journal to find out what may bring on your migraine headaches. For example, write down: ? What you eat and drink. ? How much sleep you get. ? Any change in what you eat or drink. ? Any change in your  medicines.  If you have a migraine headache: ? Avoid things that make your symptoms worse, such as bright lights. ? It may help to lie down in a dark, quiet room. ? Do not drive or use heavy machinery. ? Ask your doctor what activities are safe for you.  Keep all follow-up visits as told by your doctor. This is important. Contact a doctor if:  You get a migraine headache that is different or worse than others you have had.  You have more than 15 headache days in one month. Get help right away if:  Your migraine headache gets very bad.  Your migraine headache lasts longer than 72 hours.  You have a fever.  You have a stiff neck.  You have trouble seeing.  Your muscles feel weak or like you cannot control them.  You start to lose your balance a lot.  You start to have trouble walking.  You pass out (faint).  You have a seizure. Summary  A migraine headache is a very strong throbbing pain on one side or both sides of your head. These headaches can also cause other symptoms.  This condition may be treated with medicines and changes to your lifestyle.  Keep a journal to find out what may bring on your migraine headaches.  Contact a doctor if you get a migraine headache that is different or worse than others you have had.  Contact your doctor if you have more than 15 headache days in a month. This information is not intended to replace advice given to you by your health care provider. Make sure you discuss any questions you have with your health care provider. Document Released: 06/05/2008 Document Revised: 12/19/2018 Document Reviewed: 10/09/2018 Elsevier Patient Education  2020 [employer].

## 2019-03-27 NOTE — Progress Notes (Signed)
   Subjective:    Patient ID: Andrea Berry, female    DOB: 28-Jul-1966, 53 y.o.   MRN: [id number]   CC: headaches   HPI: Ms. Michaelson a 53 yo woman w/ PMH of SLE and Devic's disease presents today with a 1 week h/o headaches. The headaches have started every morning as soon as she wakes up affecting the L temple and are throbbing in nature. Both noise and light make the HA worse; she has nausea, no vomiting. Taking 1 Aleve gives the pt good relief throughout the day, but she finds that the HA returns the next morning. She has no rhinorrhea or lacrimation assoc. w/ the HA (lacrimation present from Devic's disease and legally blindness stable for several years). She has experienced no change in vision, no smells.There has been no change in her diet, household, stress, or medications list. Pt notes that she has remote h/o migraines that are much like the current HAs she is experiencing.  Smoking status reviewed   ROS: pertinent noted in the HPI   Past medical history, surgical, family, and social history reviewed and updated in the EMR as appropriate.  Objective:  BP 140/80   Pulse 99   SpO2 98%   Vitals and nursing note reviewed  General: NAD, pleasant, able to participate in exam HEENT: Head- Normocephalic, atraumatic. No tenderness to palpation along bilateral temples and jaw line. Opens and closes jaw without clicking or pain.\ Neck- normal ROM. Neck supple. Cardiac: RRR, S1 S2 present. normal heart sounds, no murmurs. Respiratory: CTAB, normal effort, No wheezes, rales or rhonchi Extremities: no edema or cyanosis. Skin: warm and dry, no rashes noted Neuro: alert, no obvious focal deficits Psych: Normal affect and mood   Assessment & Plan:    Migraine without aura Ms. Sherr is a 53 yo woman with PMH of SLE and Devic's disease here for new diagnosis of likely migraine w/o aura  - Since pt has had a good abortive result from Aleve, suggest she take 1 Aleve before bedtime to help  prevent the build up of the headache - Start Dexamethasone 6 mg, 1 tab qd x 5 days for migraine - Pt states that she has medication for nausea at home - She will follow up with her neurologist on 04/15/2019 - She may call or schedule an appt if symptoms do not improve or worsen    Gladys Damme, MD [employer] PGY-1

## 2019-03-27 NOTE — Assessment & Plan Note (Signed)
Andrea Berry is a 53 yo woman with PMH of SLE and Devic's disease here for new diagnosis of likely migraine w/o aura  - Since pt has had a good abortive result from Aleve, suggest she take 1 Aleve before bedtime to help prevent the build up of the headache - Start Dexamethasone 6 mg, 1 tab qd x 5 days for migraine - Pt states that she has medication for nausea at home - She will follow up with her neurologist on 04/15/2019 - She may call or schedule an appt if symptoms do not improve or worsen

## 2019-04-02 DIAGNOSIS — M329 Systemic lupus erythematosus, unspecified: Secondary | ICD-10-CM | POA: Insufficient documentation

## 2019-04-02 DIAGNOSIS — J849 Interstitial pulmonary disease, unspecified: Secondary | ICD-10-CM | POA: Insufficient documentation

## 2019-04-02 DIAGNOSIS — M3210 Systemic lupus erythematosus, organ or system involvement unspecified: Secondary | ICD-10-CM | POA: Insufficient documentation

## 2019-04-02 HISTORY — DX: Interstitial pulmonary disease, unspecified: J84.9

## 2019-04-07 ENCOUNTER — Encounter: Payer: Self-pay | Admitting: Family Medicine

## 2019-04-07 DIAGNOSIS — R768 Other specified abnormal immunological findings in serum: Secondary | ICD-10-CM

## 2019-04-07 HISTORY — DX: Other specified abnormal immunological findings in serum: R76.8

## 2019-04-07 NOTE — Telephone Encounter (Signed)
Summary ov with Dr Georga Bora (Rheum)  A/ Lupus - quiet phase Devic's Disease Low immunoglobulin levels Long-term use immunosuppressant drug  P/ Ophthalmology recommends no hydrochloroquine therapy bc of risk to remaining vision  Recommend Shingrix vaccination Prednisone Taper      - % mg daily x 30 days, then drop 1 mg monthly, monitor for evidence Lupus flare Continue alendronate until off prednisone for 2 yr drug holiday

## 2019-05-11 ENCOUNTER — Other Ambulatory Visit: Payer: Self-pay | Admitting: Family Medicine

## 2019-05-11 DIAGNOSIS — Z7952 Long term (current) use of systemic steroids: Secondary | ICD-10-CM

## 2019-05-11 DIAGNOSIS — M329 Systemic lupus erythematosus, unspecified: Secondary | ICD-10-CM

## 2019-06-05 ENCOUNTER — Other Ambulatory Visit: Payer: Self-pay | Admitting: Family Medicine

## 2019-06-05 ENCOUNTER — Other Ambulatory Visit: Payer: Self-pay | Admitting: Nurse Practitioner

## 2019-06-08 ENCOUNTER — Other Ambulatory Visit: Payer: Self-pay | Admitting: Nurse Practitioner

## 2019-06-08 ENCOUNTER — Other Ambulatory Visit: Payer: Self-pay | Admitting: Family Medicine

## 2019-06-08 DIAGNOSIS — F419 Anxiety disorder, unspecified: Secondary | ICD-10-CM

## 2019-06-23 ENCOUNTER — Other Ambulatory Visit: Payer: Self-pay | Admitting: Family Medicine

## 2019-06-23 DIAGNOSIS — M329 Systemic lupus erythematosus, unspecified: Secondary | ICD-10-CM

## 2019-06-23 DIAGNOSIS — Z7952 Long term (current) use of systemic steroids: Secondary | ICD-10-CM

## 2019-07-22 ENCOUNTER — Encounter: Payer: Medicare Other | Admitting: Nurse Practitioner

## 2019-07-22 ENCOUNTER — Ambulatory Visit: Payer: Medicare Other

## 2019-07-23 ENCOUNTER — Other Ambulatory Visit: Payer: Self-pay

## 2019-07-23 ENCOUNTER — Encounter: Payer: Self-pay | Admitting: Family Medicine

## 2019-07-23 ENCOUNTER — Ambulatory Visit (INDEPENDENT_AMBULATORY_CARE_PROVIDER_SITE_OTHER): Payer: Medicare Other | Admitting: Family Medicine

## 2019-07-23 VITALS — BP 124/70 | HR 94 | Wt 213.0 lb

## 2019-07-23 DIAGNOSIS — R05 Cough: Secondary | ICD-10-CM

## 2019-07-23 DIAGNOSIS — Z23 Encounter for immunization: Secondary | ICD-10-CM

## 2019-07-23 DIAGNOSIS — F39 Unspecified mood [affective] disorder: Secondary | ICD-10-CM

## 2019-07-23 DIAGNOSIS — R053 Chronic cough: Secondary | ICD-10-CM

## 2019-07-23 NOTE — Patient Instructions (Signed)
You received you Pneumonia vaccination and your Influenza vaccination today.   Mr Casimer Lanius, our social worker, will contact you to discuss counseling options.  Call Dr Iver Nestle (Nutritionist) to discuss consultation about healthy eating.

## 2019-07-27 NOTE — Progress Notes (Signed)
   Subjective:    Patient ID: Andrea Berry, female    DOB: Dec 31, 1965, 53 y.o.   MRN: [id number] Andrea Berry is accompanied by her niece. Sources of clinical information for visit is/are patient, relative(s) and past medical records. Nursing assessment for this office visit was reviewed with the patient for accuracy and revision.   Previous Report(s) Reviewed: historical medical records  Depression screen [facility] 2/9 07/23/2019  Decreased Interest 1  Down, Depressed, Hopeless 1  PHQ - 2 Score 2  Altered sleeping 0  Tired, decreased energy 1  Change in appetite 0  Feeling bad or failure about yourself  1  Trouble concentrating 0  Moving slowly or fidgety/restless 0  Suicidal thoughts 0  PHQ-9 Score 4  Difficult doing work/chores Not difficult at all    Fall Risk  07/23/2019 03/27/2019 07/16/2018 07/16/2018 09/12/2017  Falls in the past year? 1 0 0 0 No  Number falls in past yr: 0 0 - - -  Injury with Fall? 0 - - - -  Risk for fall due to : - - Impaired vision;Medication side effect;Impaired mobility - Other (Comment)  Risk for fall due to: Comment - - - - legally blind    Adult vaccines due  Topic Date Due  . TETANUS/TDAP  04/02/2029  . PNEUMOCOCCAL POLYSACCHARIDE VACCINE AGE 75-64 HIGH RISK  Completed    There are no preventive care reminders to display for this patient.     History/P.E. limitations: none  Adult vaccines due  Topic Date Due  . TETANUS/TDAP  04/02/2029  . PNEUMOCOCCAL POLYSACCHARIDE VACCINE AGE 75-64 HIGH RISK  Completed   There are no preventive care reminders to display for this patient. There are no preventive care reminders to display for this patient.   Chief Complaint  Patient presents with  . Follow-up     HPI   Mood, depressed - History of depression - currently on duloxetine for meed and central chronic pain - self-esteem issues around her disability      - Interested in returning to work at [employer] - decreased energy and  motivation - depressed symptoms usually controlled, but at times she will be down for several days at a time.   - She denies thoughts of self-harm - She is interested in having a therapist    Chronic cough - Onset years ago.  - improved - Taking Flonase and Astelin - no shortness of breath, no chest tightness - Not interfering with ADLs or iADLs   Decreased Strength - Has worked with PT - Able to ambulate with a cane now.   - She is proud of her improved mobility  SH: Lives alone  Review of Systems  See HPI No fever No myalgias    Objective:   Physical Exam Physical Exam VS: reviewed GEN: alert, cooperative and no distress HEENT: Ears: Normal TM's bilaterally. Normal auditory canals and external ears. Non-tender. COR: Heart sounds are normal.  Regular rate and rhythm without murmur, gallop or rub. LUNG: clear to auscultation bilaterally  EXT: No C/C/E  no deformities, using 4-point cane, has a full swing for most steps.  Holding cane in correct hand  PSYCH: Good insight/prosodic and clear speech/Language concrete and relevant/affect euthymic         Assessment & Plan:

## 2019-07-27 NOTE — Assessment & Plan Note (Signed)
Established problem that has improved.  Continuing Flonase and Astelin daily

## 2019-07-27 NOTE — Assessment & Plan Note (Signed)
Established problem Uncontrolled Episodic depression in mood, reactive to her situation.  Andrea Berry is interested in counseling. Continue Duloxetine 60 mg daily for mood and central pain.   Request Andrea Berry, to assit with connecting Andrea Berry to counseling resources.

## 2019-07-28 NOTE — Addendum Note (Signed)
Addended by: Valerie Roys on: 07/28/2019 02:03 PM   Modules accepted: Orders

## 2019-07-29 ENCOUNTER — Ambulatory Visit: Payer: Self-pay | Admitting: Licensed Clinical Social Worker

## 2019-07-29 NOTE — Chronic Care Management (AMB) (Addendum)
   Clinical Social Work Care Management Outreach Note  07/29/2019 Name: [name] MRN: [id number] DOB: June 15, 1966  Andrea Berry is a 53 y.o. year old female who is a primary care patient of McDiarmid, Blane Ohara, MD . The Care Management team was consulted for assistance with Mental Health Counseling and Resources.   LCSW reached out to Andrea Berry today by phone to introduce self and offer Care Management services.   [name] was shared information about Care Management services.  Additional information will be shared during scheduled follow up call. Patient agreed to services and verbal consent obtained.   Follow Up Plan: Appointment scheduled for SW follow up with client by phone on: 07/30/2019  Casimer Lanius, [place] / [place]   [phone] 3:31 PM

## 2019-07-30 ENCOUNTER — Ambulatory Visit: Payer: Medicare Other | Admitting: Licensed Clinical Social Worker

## 2019-07-30 ENCOUNTER — Telehealth: Payer: Self-pay | Admitting: *Deleted

## 2019-07-30 DIAGNOSIS — Z7189 Other specified counseling: Secondary | ICD-10-CM

## 2019-07-30 DIAGNOSIS — F39 Unspecified mood [affective] disorder: Secondary | ICD-10-CM

## 2019-07-30 NOTE — Patient Instructions (Addendum)
Licensed Clinical Social Worker Visit Information Ms. Needham  it was nice speaking with you. Please call me directly if you have questions [phone] Goals we discussed today:  Goals Addressed            This Visit's Progress   . "I need someone to talk to" (pt-stated)       Current Barriers:  Marland Kitchen Knowledge Deficits related to connecting to a therapist to meet ongoing counseling needs . Lacks knowledge of community resource: options and where to start Clinical Goal(s)  . Over the next 30 days, patient will work with mental health provider via [facility] Medicare to address needs related to coping skills for her depression. Interventions :  . Assessed patient's understanding, education and care needs for counseling . Referred patient to [place] to assistance with locating mental health provider for long term follow up and therapy/counseling. Patient Self Care Activities:  . Attend virtual counseling appointment scheduled Sat. 08/01/2019 . Calls provider office for new concerns or questions . Motivation to start ongoing counseling  Patient Self Care Deficits:  . Patient is unable to independently navigate connecting to going therapy without intervention Initial goal documentation     . Acknowledge receipt of Advanced Directive paperwork       Current Barriers:  . Has not been able to get document notarized Clinical Social Work Clinical Goal(s):  Marland Kitchen Over the next 45 days, patient will have Advance Directive notarized and provide a copy to [facility] to be scanned into the chart.  Interventions: . Explained to patient that she could bring Advance Directives and have them notarized at [facility].   Patient Self Care Activities:  . Can identify next of kin, power or attorney and has completed document. Initial goal documentation        Materials provided: no Ms. Bergfeld was given information about Care Management services today including:  1. Care Management services include  personalized support from designated clinical staff supervised by her physician, including individualized plan of care and coordination with other care providers 2. 24/7 contact [phone] for assistance for urgent and routine care needs. 3. Care Management services at any time by phone call to the office staff.  Patient agreed to services and verbal consent obtained.   delete this for all F/U when printing patient instructions  The patient verbalized understanding of instructions provided today and declined a print copy of patient instruction materials.  Follow up plan: Patient has phone appointment scheduled with Neoma Laming on: 08/27/19 Maurine Cane, LCSW

## 2019-07-30 NOTE — Telephone Encounter (Signed)
April called to update [place] that Andrea Berry is having a hard time with findings from a recent CT scan that Dr. Manuella Ghazi with [facility] ordered.  Per April, patient was informed that there was a possible mass on the scan that could be due to an enlarged heart and also some other concerns.  Advised April that I would update Dr. McDiarmid to see if he can see the full notes and imaging for this visit and ct scan.  Once he has gotten up to date on that, then we can address some of the concerns and anxiety that [name] is experiencing.  Per her last rheumatology visit they are wanting to send her to cardiology and patient is hoping to see a [facility] cardiologist who practices locally.  Will forward to MD. [name]

## 2019-07-30 NOTE — Chronic Care Management (AMB) (Signed)
Social Work Care Management  Initial Visit Note  07/30/2019 Name: Andrea Berry MRN: [id number] DOB: [birth year]  Assessment: Andrea Berry is a 53 y.o. year old female who sees McDiarmid, Blane Ohara, MD for primary care. The care management team was consulted for assistance with care management and care coordination needs related to connecting for ongoing mental health counseling. SDOH (Social Determinants of Health) screening performed today. Addressed needs for no : Advanced Directives. See Care Plan for related entries.   Review of patient status, including review of consultants reports, relevant laboratory and other test results, and collaboration with appropriate care team members and the patient's provider was performed as part of comprehensive patient evaluation and provision of care management services.   Outpatient Encounter Medications as of 07/30/2019  Medication Sig Note  . alendronate (FOSAMAX) 70 MG tablet TAKE 1 TABLET BY MOUTH IN THE MORNING EVERY 7 DAYS   . azelastine (ASTELIN) 0.1 % nasal spray Place 2 sprays into both nostrils 2 (two) times daily.   . [employer] SINUS PE/ALLERGY MAX ST 4-10 MG tablet TAKE 1 TABLET BY MOUTH EVERY 6 HOURS, IF TAKING COUGH SYRUP.   Marland Kitchen cyclobenzaprine (FLEXERIL) 10 MG tablet TAKE 1 TABLET (10 MG TOTAL) BY MOUTH NIGHTLY AS NEEDED FOR UP TO 30 DAYS FOR MUSCLE SPASMS.   . DULoxetine (CYMBALTA) 60 MG capsule Take 1 capsule (60 mg total) by mouth daily.   Marland Kitchen esomeprazole (NEXIUM) 40 MG capsule TAKE 1 CAPSULE BY MOUTH TWICE A DAY   . fluticasone (FLONASE) 50 MCG/ACT nasal spray Place into the nose.   Marland Kitchen LINZESS 145 MCG CAPS capsule TAKE ONE CAPSULE EVERY DAY ON EMPTY STOMACH AT LEAST 30MIN BEFORE 1ST MEAL OF THE DAY   . LORazepam (ATIVAN) 0.5 MG tablet TAKE 1 TABLET (0.5 MG TOTAL) BY MOUTH 2 (TWO) TIMES DAILY AS NEEDED FOR ANXIETY.   . OXcarbazepine (TRILEPTAL) 150 MG tablet Take 150 mg by mouth 3 (three) times daily.   . predniSONE (DELTASONE) 5 MG tablet Take 1  tablet (5 mg total) by mouth daily.   . pregabalin (LYRICA) 200 MG capsule TAKE 1 CAPSULE BY MOUTH THREE TIMES A DAY   . RiTUXimab (RITUXAN IV) Inject 1,000 mg into the vein. 04/22/2017: January 2019 is next injection   . traMADol (ULTRAM) 50 MG tablet TAKE 1 TABLET (50 MG TOTAL) BY MOUTH EVERY 12 (TWELVE) HOURS AS NEEDED.   Marland Kitchen triamcinolone cream (KENALOG) 0.1 % APPLY TO AFFECTED AREA ON CHEST ONCE TO TWICE A DAY (AVOID FACE,GROIN,AXILLA)   . XARELTO 20 MG TABS tablet TAKE 1 TABLET BY MOUTH EVERY DAY   . [DISCONTINUED] rivaroxaban (XARELTO) 20 MG TABS tablet Take 1 tablet (20 mg total) by mouth daily.    No facility-administered encounter medications on file as of 07/30/2019.    Goals Addressed            This Visit's Progress   . "I need someone to talk to" (pt-stated)       Current Barriers:  Marland Kitchen Knowledge Deficits related to connecting to a therapist to meet ongoing counseling needs . Lacks knowledge of community resource: options and where to start Clinical Goal(s)  . Over the next 30 days, patient will work with mental health provider via [facility] Medicare to address needs related to coping skills for her depression. Interventions :  . Assessed patient's understanding, education and care needs for counseling . Referred patient to [place] to assistance with locating mental health provider for long term follow  up and therapy/counseling. Patient Self Care Activities:  . Attend virtual counseling appointment scheduled Sat. 08/01/2019 . Calls provider office for new concerns or questions . Motivation to start ongoing counseling  Patient Self Care Deficits:  . Patient is unable to independently navigate connecting to going therapy without intervention Initial goal documentation     . Acknowledge receipt of Advanced Directive paperwork       Current Barriers:  . Has not been able to get document notarized Clinical Social Work Clinical Goal(s):  Marland Kitchen Over the next 45 days, patient will  have Advance Directive notarized and provide a copy to [facility] to be scanned into the chart.  Interventions: . Explained to patient that she could bring Advance Directives and have them notarized at [facility].   Patient Self Care Activities:  . Can identify next of kin, power or attorney and has completed document. Initial goal documentation        Follow up plan:  1.Telephone follow up appointment with care management team member scheduled for: 08/27/2019 2. Patient has initial counseling appointment scheduled Sat. 08/01/2019 The patient has been provided with contact information for the care management team and has been advised to call with any health related questions or concerns.   Andrea Berry was given information about Care Management services today including:  1. Care Management services include personalized support from designated clinical staff supervised by a physician, including individualized plan of care and coordination with other care providers 2. 24/7 contact phone numbers for assistance for urgent and routine care needs. 3. The patient may stop Care Management services at any time (effective at the end of the month) by phone call to the office staff. Patient agreed to services and verbal consent obtained.  Casimer Lanius, LCSW Clinical Social Worker [place] / [place]   [phone] 1:11 PM

## 2019-08-01 ENCOUNTER — Ambulatory Visit (HOSPITAL_COMMUNITY)
Admission: EM | Admit: 2019-08-01 | Discharge: 2019-08-01 | Disposition: A | Discharge status: Home / Self Care | Payer: Medicare Other | Attending: Family Medicine | Admitting: Family Medicine

## 2019-08-01 ENCOUNTER — Encounter (HOSPITAL_COMMUNITY): Payer: Self-pay

## 2019-08-01 ENCOUNTER — Other Ambulatory Visit: Payer: Self-pay

## 2019-08-01 DIAGNOSIS — Z3202 Encounter for pregnancy test, result negative: Secondary | ICD-10-CM

## 2019-08-01 DIAGNOSIS — R109 Unspecified abdominal pain: Secondary | ICD-10-CM

## 2019-08-01 LAB — POCT URINALYSIS DIP (DEVICE)
Bilirubin Urine: NEGATIVE
Glucose, UA: NEGATIVE mg/dL
Hgb urine dipstick: NEGATIVE
Ketones, ur: NEGATIVE mg/dL
Leukocytes,Ua: NEGATIVE
Nitrite: NEGATIVE
Protein, ur: NEGATIVE mg/dL
Specific Gravity, Urine: 1.02 (ref 1.005–1.030)
Urobilinogen, UA: 0.2 mg/dL (ref 0.0–1.0)
pH: 6.5 (ref 5.0–8.0)

## 2019-08-01 LAB — POC URINE PREG, ED: Preg Test, Ur: NEGATIVE

## 2019-08-01 LAB — POCT PREGNANCY, URINE: Preg Test, Ur: NEGATIVE

## 2019-08-01 MED ORDER — TRAMADOL HCL 50 MG PO TABS: 50.0000 mg | ORAL_TABLET | Freq: Four times a day (QID) | ORAL | 0 refills | Status: DC | PRN

## 2019-08-01 NOTE — Discharge Instructions (Addendum)
Be aware, pain medications may cause drowsiness in addition to causing constipation. Please do not drive, operate heavy machinery or make important decisions while on this medication, it can cloud your judgement.

## 2019-08-01 NOTE — ED Triage Notes (Signed)
Pt states she has been having left side pain for a week now. Pt states its a dull pain.

## 2019-08-03 ENCOUNTER — Telehealth: Payer: Self-pay | Admitting: Family Medicine

## 2019-08-03 DIAGNOSIS — G894 Chronic pain syndrome: Secondary | ICD-10-CM

## 2019-08-03 DIAGNOSIS — K5900 Constipation, unspecified: Secondary | ICD-10-CM

## 2019-08-03 NOTE — Telephone Encounter (Signed)
Patient called to see if PCP could call her about a few concerns she has about her ribs, pt stated that she went to urgent care on 08-02-19, please give pt a call back.

## 2019-08-03 NOTE — Telephone Encounter (Signed)
Will forward to MD to review urgent care notes and call patient back if possible.  Jazmin Hartsell,CMA

## 2019-08-03 NOTE — ED Provider Notes (Signed)
Andrea Berry   [id number] 08/01/19 Arrival Time: [id number]  ASSESSMENT & PLAN:  1. Left sided abdominal pain     Unclear etiology. Benign abdominal exam. No indications for urgent abdominal/pelvic imaging at this time. She is comfortable with home observation. Question muslcuar related given she is very tender over a very localized area to light touch. Discussed.  To use sparingly: Meds ordered this encounter  Medications  . traMADol (ULTRAM) 50 MG tablet    Sig: Take 1 tablet (50 mg total) by mouth every 6 (six) hours as needed.    Dispense:  12 tablet    Refill:  0     Discharge Instructions     Be aware, pain medications may cause drowsiness in addition to causing constipation. Please do not drive, operate heavy machinery or make important decisions while on this medication, it can cloud your judgement.    Follow-up Information    Schedule an appointment as soon as possible for a visit  with McDiarmid, Blane Ohara, MD.   Specialty: Family Medicine Contact information: [place] [address] [place].   Specialty: Emergency Medicine Why: If symptoms worsen in any way. Contact information: [address] [id number] [place] [phone]          Reviewed expectations re: course of current medical issues. Questions answered. Outlined signs and symptoms indicating need for more acute intervention. Patient verbalized understanding. After Visit Summary given.   SUBJECTIVE: History from: patient. Andrea Berry is a 53 y.o. female who presents with complaint of intermittent L side pain. Onset gradual, first noted about one week ago. Discomfort described as "just feels really sore"; without radiation; does not wake her at night. Reports normal flatus. Symptoms are unchanged since beginning. Fever: absent. Aggravating factors: certain movements  when getting out of a chair and when ambulating. Alleviating factors: include rest/sitting still. Associated symptoms: none reported. She denies belching, chills, constipation, diarrhea, fever, headache, myalgias, nausea and vomiting. Appetite: normal. PO intake: normal. Ambulatory without assistance. Urinary symptoms: none. Bowel movements: have not significantly changed; last bowel movement within the past 1-2 days. History of similar: no. OTC treatment: Tylenol without much help..  No LMP recorded. Patient is postmenopausal.   Past Surgical History:  Procedure Laterality Date  . BREAST BIOPSY    . BREAST CYST EXCISION Left pt unsure  . COLONOSCOPY WITH PROPOFOL N/A 07/26/2017   Procedure: COLONOSCOPY WITH PROPOFOL;  Surgeon: Carol Ada, MD;  Location: [facility];  Service: Endoscopy;  Laterality: N/A;  . ESOPHAGOGASTRODUODENOSCOPY (EGD) WITH PROPOFOL N/A 07/26/2017   Procedure: ESOPHAGOGASTRODUODENOSCOPY (EGD) WITH PROPOFOL;  Surgeon: Carol Ada, MD;  Location: [facility];  Service: Endoscopy;  Laterality: N/A;  . PORTA CATH INSERTION    . TUBAL LIGATION    . TUBAL LIGATION      ROS: As per HPI. All other systems negative.  OBJECTIVE:  Vitals:   08/01/19 1033  Weight: 96.2 kg    I did see her normal vital signs on the monitor when I examined her. Values not entered into chart by CMA.  General appearance: alert, oriented, no acute distress HEENT: [place]; AT; oropharynx moist Lungs: clear to auscultation bilaterally; unlabored respirations Heart: regular rate and rhythm Abdomen: obese; soft; without distention; well localized tenderness to palpation over her L side just below ribs; normal bowel sounds; without masses or organomegaly; without guarding or rebound tenderness Back: without specific  CVA tenderness; FROM at waist Extremities: without LE edema; symmetrical; without gross deformities Skin: warm and dry Neurologic: normal gait Psychological: alert and cooperative;  normal mood and affect  Labs: Results for orders placed or performed during the hospital encounter of 08/01/19  POC urine pregnancy  Result Value Ref Range   Preg Test, Ur NEGATIVE NEGATIVE  POCT urinalysis dip (device)  Result Value Ref Range   Glucose, UA NEGATIVE NEGATIVE mg/dL   Bilirubin Urine NEGATIVE NEGATIVE   Ketones, ur NEGATIVE NEGATIVE mg/dL   Specific Gravity, Urine 1.020 1.005 - 1.030   Hgb urine dipstick NEGATIVE NEGATIVE   pH 6.5 5.0 - 8.0   Protein, ur NEGATIVE NEGATIVE mg/dL   Urobilinogen, UA 0.2 0.0 - 1.0 mg/dL   Nitrite NEGATIVE NEGATIVE   Leukocytes,Ua NEGATIVE NEGATIVE  Pregnancy, urine POC  Result Value Ref Range   Preg Test, Ur NEGATIVE NEGATIVE   Labs Reviewed  POC URINE PREG, ED  POCT URINALYSIS DIP (DEVICE)  POCT PREGNANCY, URINE    No Known Allergies                                             Past Medical History:  Diagnosis Date  . Anemia of chronic disease 10/28/2015  . Anxiety 07/12/2018  . Arthritis   . Ataxia 01/03/2016  . Blind   . Chronic central neuropathic pain 10/02/2018   [facility]2017  . Chronic pain syndrome 07/12/2018  . Decreased strength 10/08/2018   Proximal leg muscles  . Depression   . Depression with anxiety 01/03/2016  . Devic's disease ([place])   . Dysesthesia 01/03/2016  . Dyspnea on exertion 11/07/2018  . Eustachian tube dysfunction 11/18/2018   L>R. Followed by [facility].  . Eustachian tube dysfunction, bilateral 09/18/2018  . GERD (gastroesophageal reflux disease)   . Headache(784.0)   . History of chicken pox 10/08/2018  . History of CVA (cerebrovascular accident)   . History of kidney stones   . Hypertension   . Hyponatremia 11/25/2015  . Impairment of balance 10/08/2018  . Legal blindness 01/30/2014  . Long-term corticosteroid use 10/08/2018  . Lupus ([place])   . Lupus anticoagulant disorder ([place]) 10/02/2018   Pulmonary Embolism 2017  . Lupus cerebritis ([place]) 11/25/2015   Possible explanation of  11/25/2015 acute hyperintensity findings on Brain MRI.   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 Orthostatic dizziness, Recurrent 10/08/2018   Associated with medications  . Otalgia 07/12/2018  . Personal history of immunosupression therapy 10/08/2018   Chronic Azathioprine and prednisone therapy for Devic's disease  . Pigmented skin lesion of uncertain nature [id number]  . Shortness of breath    due to medication  . Skin lesion of chest wall 11/07/2018  . SLE (systemic lupus erythematosus related syndrome) ([place]) 11/07/2018  . Smith antibody positive 04/07/2019  . Stroke [facility]) 2008   visually impaired   Social History   Socioeconomic History  . Marital status: Single    Spouse name: Not on file  . Number of children: Not on file  . Years of education: Not on file  . Highest education level: Not on file  Occupational History  . Occupation: disability  Social Needs  . Financial resource strain: Not hard at all  . Food insecurity    Worry: Never true    Inability: Never true  . Transportation needs  Medical: No    Non-medical: No  Tobacco Use  . Smoking status: Never Smoker  . Smokeless tobacco: Never Used  Substance and Sexual Activity  . Alcohol use: No  . Drug use: No  . Sexual activity: Not on file  Lifestyle  . Physical activity    Days per week: 2 days    Minutes per session: 30 min  . Stress: Not at all  Relationships  . Social Herbalist on phone: Not on file    Gets together: Not on file    Attends religious service: Not on file    Active member of club or organization: Not on file    Attends meetings of clubs or organizations: Not on file    Relationship status: Not on file  . Intimate partner violence    Fear of current or ex partner: No    Emotionally abused: No    Physically abused: No    Forced sexual activity: No  Other Topics Concern  . Not on file  Social History Narrative   Patient lives with son [name] [phone])  as of 10/2018   Support from family   Frances [place] the following services: Food benefits, Social Security [place], [place] and [place] with  [place] (10/2018).    Involved in her church and recives support from her church   Insufficient physical activity   Family History  Problem Relation Age of Onset  . Cancer Mother   . Cancer Father   . Lupus Sister      Vanessa Kick, MD 08/03/19 0900

## 2019-08-04 DIAGNOSIS — K5900 Constipation, unspecified: Secondary | ICD-10-CM | POA: Insufficient documentation

## 2019-08-04 DIAGNOSIS — K5909 Other constipation: Secondary | ICD-10-CM | POA: Insufficient documentation

## 2019-08-04 HISTORY — DX: Other constipation: K59.09

## 2019-08-04 MED ORDER — TRAMADOL HCL 50 MG PO TABS: 50.0000 mg | ORAL_TABLET | Freq: Four times a day (QID) | ORAL | 1 refills | Status: DC | PRN

## 2019-08-04 MED ORDER — LINACLOTIDE 145 MCG PO CAPS: ORAL_CAPSULE | ORAL | 1 refills | Status: DC

## 2019-08-04 NOTE — Telephone Encounter (Signed)
I spoke with Andrea Berry about her concerns from recent imaging results at [facility], referral by her [facility] rheumatologist to a pulmonologist, and concern that her rheumatologist was going to send her to a cardiologist, and concern about a left sided abdomina/left lateral thoracic pain for which she was seen at a [facility] urgent care on 08/01/19. The 67 working assessment appear to be a pain in the muscular abdominal wall.  I reviewed the radiologist's interpretation of the Chest CT 07/27/19 at [facility]. Findings were consistent with a form of interstitial lung disease that may be due to patient's SLE.  There is a 1.3 x 1.7 cm nodular airspace opacity in posterior left lower lung.   Her hear size was interpreter as normal.  Pericardium was noted to have no effusion.   A/ Possible Interstitial Lung Disease - According to careeverywhere, Rheum clinic at [facility] offered a patient a pulmonary appointment 11/19 which patient declined because she wanted a pulmonologist in [place].  There is no note I could find documenting this new consultation.  - Lung nodule  - Left lower thorax/left upper abdominal pain, acute, stable.   Plan Andrea Berry will contact the [facility] Rheum clinic to see about progress on getting pulm consultation in [place].  If she is unable to obtain the consultation through this route, she may contact our office to assist.   Refilled patient's Tramadol 50 mg Q6h prn pain Advised to use local ice/heat, tramadol, avoid acts that worsen pain at site To go to urgent care or ED if condition worsens.  If pain still present next week, patient should be seen at [facility].

## 2019-08-10 NOTE — Telephone Encounter (Signed)
Provider spoke with patient.  Markan Cazarez,CMA

## 2019-08-27 ENCOUNTER — Ambulatory Visit: Payer: Medicare Other | Admitting: Licensed Clinical Social Worker

## 2019-08-27 ENCOUNTER — Telehealth: Payer: Self-pay | Admitting: *Deleted

## 2019-08-27 ENCOUNTER — Other Ambulatory Visit: Payer: Self-pay

## 2019-08-27 DIAGNOSIS — Z7189 Other specified counseling: Secondary | ICD-10-CM

## 2019-08-27 DIAGNOSIS — F39 Unspecified mood [affective] disorder: Secondary | ICD-10-CM

## 2019-08-27 DIAGNOSIS — G8929 Other chronic pain: Secondary | ICD-10-CM

## 2019-08-27 NOTE — Chronic Care Management (AMB) (Signed)
  Social Work Care Management  Follow Up Note  08/27/2019 Name: Andrea Berry MRN: [id number] DOB: [birth year]  Referred by: McDiarmid, Blane Ohara, MD Reason for referral : Care Coordination (mental health resources)  Andrea Berry is a 53 y.o. year old female who is a primary care patient of McDiarmid, Blane Ohara, MD.  Reason for follow-up: phone encounter with patient today for ongoing assessment and brief interventions to assist with care coordination needs related to mental health resources for counseling.   Assessment: Patient continues to experience difficulty at times with managing symptoms of depression.  Patient did not keep scheduled appointment for therapist via her insurance providers.  SDOH (Social Determinants of Health) screening performed today:. See Care Plan for related entries.  Advanced Directives: See Care Plan and Vynca application for related entries.   Review of patient status, including review of consultants reports, relevant laboratory and other test results, and collaboration with appropriate care team members and the patient's provider was performed as part of comprehensive patient evaluation and provision of chronic care management services.   Goals Addressed            This Visit's Progress   . "I need someone to talk to" (pt-stated)   Not on track    Current Barriers:  . Unmet counseling needs for patient with anxiety, mood disorder and Chronic rhinitis . Patient needs support, education, and care coordination to connecting to counseling . Knowledge Deficits related to connecting to a therapist to meet ongoing counseling needs Clinical Goal(s)  . Over the next 30 days, patient will contact mental health provider via [facility] Medicare to schedule appointment to address needs related to coping skills for her depression. Interventions :  . Assessed patient's understanding, education and care needs for counseling . Assessed barriers to why patient did not keep previous  virtual counseling appointment . Discussed benefits for long term therapy/counseling. Patient Self Care Activities:  . Calls provider office for new concerns or questions . Motivation to start ongoing counseling  Patient Self Care Deficits:  . Patient is unable to independently navigate connecting to going therapy without intervention Please see past updates related to this goal by clicking on the "Past Updates" button in the selected goal      . Acknowledge receipt of Advanced Directive paperwork   Not on track    Current Barriers:  . Has not been able to get document notarized Clinical Social Work Clinical Goal(s):  Marland Kitchen Over the next 45 days, patient will have Advance Directive notarized and provide a copy to [facility] to be scanned into the chart.  Interventions: . Explained to patient that she could bring Advance Directives and have them notarized at [facility].   Patient Self Care Activities:  . Can identify next of kin, power or attorney and has completed document. Initial goal documentation     Plan: LCSW will F/U with patient in 30 days  Casimer Lanius, [place] / [place]   [phone] 10:08 AM

## 2019-08-27 NOTE — Telephone Encounter (Signed)
[name] wanted to let PCP know that they received daily BP readings for pt.  Previously, she would have diastolic readings in the [id number] but this week has been consistent.  Mon - 127/92 P:92  Tues - 131/93 P: 93 Wed: 132/96 P:99 Thurs: 128/95 P:109  Pt is not currently on any BP meds  To PCP. Christen Bame, CMA

## 2019-08-27 NOTE — Patient Instructions (Signed)
Licensed Clinical Social Worker Visit Information Ms. Cassaro  it was nice speaking with you. Please call me directly if you have questions [phone] Goals we discussed today:  Goals Addressed            This Visit's Progress   . "I need someone to talk to" (pt-stated)   Not on track    Current Barriers:  . Unmet counseling needs for patient with anxiety, mood disorder and Chronic rhinitis . Patient needs support, education, and care coordination to connecting to counseling . Knowledge Deficits related to connecting to a therapist to meet ongoing counseling needs Clinical Goal(s)  . Over the next 30 days, patient will contact mental health provider via [facility] Medicare to schedule appointment to address needs related to coping skills for her depression. Interventions :  . Assessed patient's understanding, education and care needs for counseling . Assessed barriers to why patient did not keep previous virtual counseling appointment . Discussed benefits for long term therapy/counseling. Patient Self Care Activities:  . Calls provider office for new concerns or questions . Motivation to start ongoing counseling  Patient Self Care Deficits:  . Patient is unable to independently navigate connecting to going therapy without intervention Please see past updates related to this goal by clicking on the "Past Updates" button in the selected goal      . Acknowledge receipt of Advanced Directive paperwork   Not on track    Current Barriers:  . Has not been able to get document notarized Clinical Social Work Clinical Goal(s):  Marland Kitchen Over the next 45 days, patient will have Advance Directive notarized and provide a copy to [facility] to be scanned into the chart.  Interventions: . Explained to patient that she could bring Advance Directives and have them notarized at [facility].   Patient Self Care Activities:  . Can identify next of kin, power or attorney and has completed document. Initial  goal documentation       Materials provided:  Ms. Patman was given information about Care Management services today including:  1. Care Management services include personalized support from designated clinical staff supervised by her physician, including individualized plan of care and coordination with other care providers 2. 24/7 contact [phone] for assistance for urgent and routine care needs. 3. Care Management services at any time by phone call to the office staff. The patient verbalized understanding of instructions provided today and declined a print copy of patient instruction materials.  Follow up plan:  SW will follow up with patient by phone over the next 3 to 4 weeks  Maurine Cane, LCSW

## 2019-08-28 ENCOUNTER — Other Ambulatory Visit: Payer: Self-pay | Admitting: Nurse Practitioner

## 2019-08-28 ENCOUNTER — Other Ambulatory Visit: Payer: Self-pay | Admitting: Family Medicine

## 2019-08-28 ENCOUNTER — Encounter: Payer: Self-pay | Admitting: Family Medicine

## 2019-08-28 DIAGNOSIS — R030 Elevated blood-pressure reading, without diagnosis of hypertension: Secondary | ICD-10-CM

## 2019-08-28 DIAGNOSIS — M329 Systemic lupus erythematosus, unspecified: Secondary | ICD-10-CM

## 2019-08-28 DIAGNOSIS — Z7952 Long term (current) use of systemic steroids: Secondary | ICD-10-CM

## 2019-08-28 HISTORY — DX: Elevated blood-pressure reading, without diagnosis of hypertension: R03.0

## 2019-08-28 MED ORDER — PREDNISONE 5 MG PO TABS: 5.0000 mg | ORAL_TABLET | Freq: Every day | ORAL | 1 refills | Status: DC

## 2019-08-28 NOTE — Telephone Encounter (Signed)
Slightly elevated DBP added to pt's px list for office follow up.

## 2019-08-31 DIAGNOSIS — I517 Cardiomegaly: Secondary | ICD-10-CM | POA: Insufficient documentation

## 2019-08-31 DIAGNOSIS — J849 Interstitial pulmonary disease, unspecified: Secondary | ICD-10-CM | POA: Insufficient documentation

## 2019-09-14 ENCOUNTER — Telehealth: Payer: Self-pay | Admitting: Allergy

## 2019-09-14 DIAGNOSIS — J310 Chronic rhinitis: Secondary | ICD-10-CM

## 2019-09-14 NOTE — Telephone Encounter (Signed)
Please call patient and have her schedule a follow up visit with me.  Ask if she got the pneumonia shot (pneumovax 23) and Tetanus shot. If yes and it's been more than 4 weeks ago that she got the injection, she needs repeat bloodwork - pneumococcal titers and tetanus/diptheria titers.   I talked to her rheumatologist Dr. Manuella Ghazi at [facility] as they had questions about her immune system. They want to add another therapy for her lupus.

## 2019-09-14 NOTE — Telephone Encounter (Signed)
Patient received immunizations over 4 weeks ago. Orders placed and she wishes to wait until her visit to have blood drawn. She is scheduled for 09-16-2019 with Dr. [name] in [place].

## 2019-09-14 NOTE — Addendum Note (Signed)
Addended by: Lucrezia Starch I on: 09/14/2019 01:26 PM   Modules accepted: Orders

## 2019-09-16 ENCOUNTER — Encounter: Payer: Self-pay | Admitting: Allergy

## 2019-09-16 ENCOUNTER — Ambulatory Visit (INDEPENDENT_AMBULATORY_CARE_PROVIDER_SITE_OTHER): Payer: Medicare Other | Admitting: Allergy

## 2019-09-16 ENCOUNTER — Other Ambulatory Visit: Payer: Self-pay

## 2019-09-16 VITALS — Ht 61.0 in | Wt 210.0 lb

## 2019-09-16 DIAGNOSIS — J310 Chronic rhinitis: Secondary | ICD-10-CM | POA: Diagnosis not present

## 2019-09-16 DIAGNOSIS — R053 Chronic cough: Secondary | ICD-10-CM

## 2019-09-16 DIAGNOSIS — Z130 Encounter for screening for diseases of the blood and blood-forming organs and certain disorders involving the immune mechanism: Secondary | ICD-10-CM | POA: Diagnosis not present

## 2019-09-16 DIAGNOSIS — R05 Cough: Secondary | ICD-10-CM

## 2019-09-16 MED ORDER — AZELASTINE HCL 0.1 % NA SOLN: 2.0000 | Freq: Two times a day (BID) | NASAL | 5 refills | Status: DC | PRN

## 2019-09-16 NOTE — Patient Instructions (Addendum)
Chronic coughing Follow up with pulmonology as scheduled. Get the breathing test as scheduled. Monitor symptoms.   Chronic rhinitis  Continue Flonase 1-2 sprays per nostril daily for nasal congestion.   Continue azelastine 2 sprays per nostril twice a day for post nasal drip.  Nasal saline spray (i.e., Simply Saline) or nasal saline lavage (i.e., NeilMed) is recommended as needed and prior to medicated nasal sprays.   Keep track of infections. Get bloodwork:  We are ordering labs, so please allow 1-2 weeks for the results to come back. With the newly implemented Cures Act, the labs might be visible to you at the same time that they become visible to me. However, I will not address the results until all of the results are back, so please be patient.   Follow up in 4 months or sooner if needed. Sincerely,   Rexene Alberts, DO Allergy & Immunology  [facility] office: [phone] [facility] office: [place] office: [phone]

## 2019-09-16 NOTE — Progress Notes (Signed)
RE: Andrea Berry MRN: [id number] DOB: [birth year] Date of Telemedicine Visit: 09/16/2019  Referring provider: McDiarmid, Andrea Ohara, MD Primary care provider: McDiarmid, Andrea Ohara, MD  Chief Complaint: Cough   Telemedicine Follow Up Visit via Telephone: I connected with Andrea Berry for a follow up on 09/17/19 by telephone and verified that I am speaking with the correct person using two identifiers.   I discussed the limitations, risks, security and privacy concerns of performing an evaluation and management service by telephone and the availability of in person appointments. I also discussed with the patient that there may be a patient responsible charge related to this service. The patient expressed understanding and agreed to proceed.  Patient is at home. Provider is at the office.  Visit start time: 3:30PM Visit end time: 4:00PM Insurance consent/check in by: front desk Medical consent and medical assistant/nurse: Andrea P.  History of Present Illness: She is a 54 y.o. female, who is being followed for chronic coughing, chronic rhinitis and history of poor vaccine titers. Her previous allergy office visit was on 03/05/2019 with Dr. Maudie Berry. Today is a regular follow up visit. Failed to follow up as recommended.   Chronic coughing Patient had televisit with pulmonology for ILD.  She is scheduled for PFT on 09/22/2019.   Coughing is the same with no changes.  Patient ran out of Dulera 100 2 puffs BID about 1 month after our last visit so she has been out of her inhalers for the past 5 months with no changes in her coughing.  She is still taking Nexium 40mg  once a dy.  Chronic rhinitis Currently on Flonase 1-2 sprays per nostril daily and azelastine 2 sprays twice a day which seems to help.   Infections No antibiotics or infections since the last visit.  Patient did get pneumonia vaccine and tetanus vaccine about 4-5 months ago but has not had repeat titers.  Patient is being followed  by rheumatology for her lupus and is on rituximab.   Assessment and Plan: Andrea Berry is a 54 y.o. female with: Chronic coughing Past history - Chronic coughing with some sputum production for 1 year. Worse during the fall and spring but no specific triggers noted. Patient had full PFT on 11/24/2018 which was unremarkable. She tried antibiotics which help but then coughing returns once she stops. Started on Flonase, ipratropium with minimal benefit. Has reflux and takes Nexium. Co-morbidities include steroid dependent, SLE, devic's disease and strokes. 2020 skin testing showed: negative to environmental allergy panel, and basic foods however positive control was borderline questioning the validity of the results. 2020 immunocap was negative to environmental allergies - full panel was not run due to lack of sample but IgE was <2. No indication for repeat re-draw as my suspicion for environmental allergies are low.  Interim history - Patient rant out her Dulera 5 months ago and her coughing has not worsened. She saw pulmonology and scheduled for PFTs next week. She had CT chest concerning for ILD? With mild bronchiectasis/bronchiolectasis in the lower lobes of both lungs.  Discussed with patient that I'm not going to restart her inhalers for now as she is scheduled for PFT and follow up with pulmonology and stopping inhalers did not worsen her symptoms.  Monitor symptoms.   Chronic rhinitis Past history: 2020 skin testing was negative to environmental allergy panel and bloodwork IgE <2, full panel not run due to lack of sample but no allergens noted.  Interim history - stable with below regimen.  Continue Flonase 1-2 sprays per nostril daily for nasal congestion.   Continue azelastine 2 sprays per nostril twice a day for post nasal drip.  Nasal saline spray (i.e., Simply Saline) or nasal saline lavage (i.e., NeilMed) is recommended as needed and prior to medicated nasal sprays.  Encounter for screening  for diseases of the blood and blood-forming organs and certain disorders involving the immune mechanism Past history - Bloodwork showed poor s pneumo titers and absent diptheria titer. Normal IgG.  Interim history - No additional infections/antibiotics use. She got boosters for vaccine but no repeat titers.   Keep track of infections. Get bloodwork.   Return in about 4 months (around 01/14/2020).  Meds ordered this encounter  Medications  . azelastine (ASTELIN) 0.1 % nasal spray    Sig: Place 2 sprays into both nostrils 2 (two) times daily as needed for rhinitis.    Dispense:  30 mL    Refill:  5   Diagnostics: None.  Medication List:  Current Outpatient Medications  Medication Sig Dispense Refill  . alendronate (FOSAMAX) 70 MG tablet TAKE 1 TABLET BY MOUTH IN THE MORNING EVERY 7 DAYS 12 tablet 0  . azelastine (ASTELIN) 0.1 % nasal spray Place 2 sprays into both nostrils 2 (two) times daily as needed for rhinitis. 30 mL 5  . cyclobenzaprine (FLEXERIL) 10 MG tablet TAKE 1 TABLET (10 MG TOTAL) BY MOUTH NIGHTLY AS NEEDED FOR UP TO 30 DAYS FOR MUSCLE SPASMS.    . DULoxetine (CYMBALTA) 60 MG capsule Take 1 capsule (60 mg total) by mouth daily. 90 capsule 3  . esomeprazole (NEXIUM) 40 MG capsule TAKE 1 CAPSULE BY MOUTH TWICE A DAY 180 capsule 1  . fluticasone (FLONASE) 50 MCG/ACT nasal spray Place into the nose.    . linaclotide (LINZESS) 145 MCG CAPS capsule TAKE ONE CAPSULE EVERY DAY ON EMPTY STOMACH AT LEAST 30MIN BEFORE 1ST MEAL OF THE DAY 90 capsule 1  . LORazepam (ATIVAN) 0.5 MG tablet TAKE 1 TABLET (0.5 MG TOTAL) BY MOUTH 2 (TWO) TIMES DAILY AS NEEDED FOR ANXIETY. 30 tablet 5  . OXcarbazepine (TRILEPTAL) 150 MG tablet Take 150 mg by mouth 3 (three) times daily.    . predniSONE (DELTASONE) 5 MG tablet Take 1 tablet (5 mg total) by mouth daily. 90 tablet 1  . pregabalin (LYRICA) 200 MG capsule TAKE 1 CAPSULE BY MOUTH THREE TIMES A DAY 90 capsule 0  . RiTUXimab (RITUXAN IV) Inject 1,000  mg into the vein.    Marland Kitchen traMADol (ULTRAM) 50 MG tablet Take 1 tablet (50 mg total) by mouth every 6 (six) hours as needed. 60 tablet 1  . XARELTO 20 MG TABS tablet TAKE 1 TABLET BY MOUTH EVERY DAY 90 tablet 1  . [employer] SINUS PE/ALLERGY MAX ST 4-10 MG tablet TAKE 1 TABLET BY MOUTH EVERY 6 HOURS, IF TAKING COUGH SYRUP. (Patient not taking: Reported on 09/16/2019) 24 tablet 1  . triamcinolone cream (KENALOG) 0.1 % APPLY TO AFFECTED AREA ON CHEST ONCE TO TWICE A DAY (AVOID FACE,GROIN,AXILLA)     No current facility-administered medications for this visit.   Allergies: No Known Allergies I reviewed her past medical history, social history, family history, and environmental history and no significant changes have been reported from her previous visit.  Review of Systems  Constitutional: Negative for appetite change, chills, fever and unexpected weight change.  HENT: Positive for postnasal drip. Negative for congestion and rhinorrhea.   Eyes: Negative for itching.  Respiratory: Positive for cough. Negative  for chest tightness, shortness of breath and wheezing.   Cardiovascular: Negative for chest pain.  Gastrointestinal: Negative for abdominal pain.  Genitourinary: Negative for difficulty urinating.  Skin: Negative for rash.  Allergic/Immunologic: Negative for environmental allergies and food allergies.  Neurological: Negative for headaches.   Objective: Physical Exam Not obtained as encounter was done via telephone.   Previous notes and tests were reviewed.  I discussed the assessment and treatment plan with the patient. The patient was provided an opportunity to ask questions and all were answered. The patient agreed with the plan and demonstrated an understanding of the instructions. After visit summary/patient instructions available via mail.   The patient was advised to call back or seek an in-person evaluation if the symptoms worsen or if the condition fails to improve as anticipated.  I  provided 30 minutes of non-face-to-face time during this encounter.  It was my pleasure to participate in Ezma Qiu care today. Please feel free to contact me with any questions or concerns.   Sincerely,  Rexene Alberts, DO Allergy & Immunology  [facility] office: [phone] [facility] office: [place] office: [phone]

## 2019-09-17 ENCOUNTER — Encounter: Payer: Self-pay | Admitting: Allergy

## 2019-09-17 NOTE — Assessment & Plan Note (Signed)
Past history - Bloodwork showed poor s pneumo titers and absent diptheria titer. Normal IgG.  Interim history - No additional infections/antibiotics use. She got boosters for vaccine but no repeat titers.   Keep track of infections. Get bloodwork.

## 2019-09-17 NOTE — Assessment & Plan Note (Signed)
Past history - Chronic coughing with some sputum production for 1 year. Worse during the fall and spring but no specific triggers noted. Patient had full PFT on 11/24/2018 which was unremarkable. She tried antibiotics which help but then coughing returns once she stops. Started on Flonase, ipratropium with minimal benefit. Has reflux and takes Nexium. Co-morbidities include steroid dependent, SLE, devic's disease and strokes. 2020 skin testing showed: negative to environmental allergy panel, and basic foods however positive control was borderline questioning the validity of the results. 2020 immunocap was negative to environmental allergies - full panel was not run due to lack of sample but IgE was <2. No indication for repeat re-draw as my suspicion for environmental allergies are low.  Interim history - Patient rant out her Dulera 5 months ago and her coughing has not worsened. She saw pulmonology and scheduled for PFTs next week. She had CT chest concerning for ILD? With mild bronchiectasis/bronchiolectasis in the lower lobes of both lungs.  Discussed with patient that I'm not going to restart her inhalers for now as she is scheduled for PFT and follow up with pulmonology and stopping inhalers did not worsen her symptoms.  Monitor symptoms.

## 2019-09-17 NOTE — Assessment & Plan Note (Signed)
Past history: 2020 skin testing was negative to environmental allergy panel and bloodwork IgE <2, full panel not run due to lack of sample but no allergens noted.  Interim history - stable with below regimen.  Continue Flonase 1-2 sprays per nostril daily for nasal congestion.   Continue azelastine 2 sprays per nostril twice a day for post nasal drip.  Nasal saline spray (i.e., Simply Saline) or nasal saline lavage (i.e., NeilMed) is recommended as needed and prior to medicated nasal sprays.

## 2019-09-22 ENCOUNTER — Other Ambulatory Visit: Payer: Self-pay

## 2019-09-22 ENCOUNTER — Encounter: Payer: Self-pay | Admitting: Licensed Clinical Social Worker

## 2019-09-22 ENCOUNTER — Ambulatory Visit: Payer: Medicare Other | Admitting: Licensed Clinical Social Worker

## 2019-09-22 DIAGNOSIS — F419 Anxiety disorder, unspecified: Secondary | ICD-10-CM

## 2019-09-22 DIAGNOSIS — F39 Unspecified mood [affective] disorder: Secondary | ICD-10-CM

## 2019-09-22 NOTE — Patient Instructions (Signed)
Licensed Clinical Social Worker Visit Information Ms. Santor  it was nice speaking with you. Please call me directly if you have questions [phone] Goals we discussed today:  Goals Addressed            This Visit's Progress   . Acknowledge receipt of Advanced Directive paperwork   Not on track    Current Barriers:  . Has not been able to get document notarized Clinical Social Work Clinical Goal(s):  Marland Kitchen Over the next 45 days, patient will have Advance Directive notarized and provide a copy to [facility] to be scanned into the chart.  Interventions: . Explained to patient that she could bring Advance Directives and have them notarized at [facility].   Patient Self Care Activities:  . Can identify next of kin, power or attorney and has completed document. Initial goal documentation      . Manage symptoms of depression (pt-stated)   On track    Current Barriers:  . Unmet counseling needs for patient with anxiety, mood disorder and Chronic rhinitis . Patient has decided she no longer wants to connect for ongoing counseling has resources and information if she changes her mind Clinical Goal(s)  . Over the next 30 days, patient will continue to implement interventions of exercising 3 times per week and cross word puzzles Interventions :  . Assessed patient's ongoing needs  . Assessed barriers to why patient did not keep previous virtual counseling appointment . Discussed benefits of interventions to manage symptom of depression  .  (self-care action plan) and well as interventions to assist with sleep hygiene. Copies of work Public house manager and information mailed to patient. . Patient Self Care Activities:  . Call provider office for new concerns or questions . Motivated to continue with self care Please see past updates related to this goal by clicking on the "Past Updates" button in the selected goal         Materials provided: Yes: sleep Hygiene and Self-Care Action Plan Ms.  Awbrey was given information about Care Management services today including:  1. Care Management services include personalized support from designated clinical staff supervised by her physician, including individualized plan of care and coordination with other care providers 2. 24/7 contact [phone] for assistance for urgent and routine care needs. 3. Care Management services at any time by phone call to the office staff.  Print copy of patient instructions provided.  Follow up plan:  SW will follow up with patient by phone over the next 3 weeks see day and time for phone appointment  Maurine Cane, LCSW

## 2019-09-22 NOTE — Chronic Care Management (AMB) (Signed)
  Social Work Care Management  Follow Up  09/22/2019 Name: Andrea Berry MRN: [id number] DOB: August 30, 1966  Referred by: McDiarmid, Blane Ohara, MD Reason for referral : Care Coordination (mental health resources) Andrea Berry is a 54 y.o. year old female who is a primary care patient of McDiarmid, Blane Ohara, MD.  Reason for follow-up: phone encounter with patient today for ongoing assessment and brief interventions to assist with managing symptoms of depression and care coordination needs.    Assessment: Patient continues to experience minimal system of depression. She is making progress towards goal and has decided not to pursue counseling at this time. Report concerns with staying sleeping over the past month.  She continues to take psychotropic medication reporting no missed dose.  Patient my benefit from and is in agreement to implement sleep hygiene as well as self-care action plan.    Review of patient status, including review of consultants reports, relevant laboratory and other test results, and collaboration with appropriate care team members and the patient's provider was performed as part of comprehensive patient evaluation and provision of chronic care management services.   SDOH (Social Determinants of Health) screening performed today:. See Care Plan for related entries.  Advanced Directives: See Care Plan for related entries.   Goals Addressed            This Visit's Progress   . Acknowledge receipt of Advanced Directive paperwork   Not on track    Current Barriers:  . Has not been able to get document notarized Clinical Social Work Clinical Goal(s):  Marland Kitchen Over the next 45 days, patient will have Advance Directive notarized and provide a copy to [facility] to be scanned into the chart.  Interventions: . Explained to patient that she could bring Advance Directives and have them notarized at [facility].   Patient Self Care Activities:  . Can identify next of kin, power or  attorney and has completed document. Initial goal documentation      . Manage symptoms of depression (pt-stated)   On track    Current Barriers:  . Unmet counseling needs for patient with anxiety, mood disorder and Chronic rhinitis . Patient has decided she no longer wants to connect for ongoing counseling has resources and information if she changes her mind Clinical Goal(s)  . Over the next 30 days, patient will continue to implement interventions of exercising and cross word puzzles Interventions :  . Assessed patient's ongoing needs  . Assessed barriers to why patient did not keep previous virtual counseling appointment . Discussed benefits of interventions to manage symptom of depression  .  (self-care action plan) and well as interventions to assist with sleep hygiene. Copies of work Public house manager and information mailed to patient. . Patient Self Care Activities:  . Call provider office for new concerns or questions . Motivated to continue with self care Please see past updates related to this goal by clicking on the "Past Updates" button in the selected goal        Plan: 1. Sleep Hygiene and Depression Self Care Action plan mailed 2. LCSW will F/U with patient in three weeks  Andrea Berry, [place] / [place]   [phone] 10:56 AM

## 2019-09-26 LAB — IGG, IGA, IGM
IgA/Immunoglobulin A, Serum: 136 mg/dL (ref 87–352)
IgM (Immunoglobulin M), Srm: 15 mg/dL — ABNORMAL LOW (ref 26–217)

## 2019-09-26 LAB — COMPLEMENT, TOTAL: Compl, Total (CH50): >60 U/mL (ref >41)

## 2019-09-26 LAB — IGG 1, 2, 3, AND 4
IgG (Immunoglobin G), Serum: 890 mg/dL (ref 586–1602)
IgG, Subclass 1: 611 mg/dL (ref 248–810)
IgG, Subclass 2: 124 mg/dL — ABNORMAL LOW (ref 130–555)
IgG, Subclass 3: 25 mg/dL (ref 15–102)
IgG, Subclass 4: 5 mg/dL (ref 2–96)

## 2019-10-05 LAB — DIPHTHERIA / TETANUS ANTIBODY PANEL
Diphtheria Ab: 0.12 IU/mL (ref <0.10)
Tetanus Ab, IgG: 1.26 IU/mL (ref <0.10)

## 2019-10-05 LAB — STREP PNEUMONIAE 23 SEROTYPES IGG
Pneumo Ab Type 1*: <0.1 ug/mL — ABNORMAL LOW (ref >1.3)
Pneumo Ab Type 12 (12F)*: <0.1 ug/mL — ABNORMAL LOW (ref >1.3)
Pneumo Ab Type 14*: 1.8 ug/mL (ref >1.3)
Pneumo Ab Type 17 (17F)*: <0.1 ug/mL — ABNORMAL LOW (ref >1.3)
Pneumo Ab Type 19 (19F)*: 0.6 ug/mL — ABNORMAL LOW (ref >1.3)
Pneumo Ab Type 2*: 0.5 ug/mL — ABNORMAL LOW (ref >1.3)
Pneumo Ab Type 20*: <0.1 ug/mL — ABNORMAL LOW (ref >1.3)
Pneumo Ab Type 22 (22F)*: <0.1 ug/mL — ABNORMAL LOW (ref >1.3)
Pneumo Ab Type 23 (23F)*: <0.1 ug/mL — ABNORMAL LOW (ref >1.3)
Pneumo Ab Type 26 (6B)*: 0.3 ug/mL — ABNORMAL LOW (ref >1.3)
Pneumo Ab Type 3*: 0.3 ug/mL — ABNORMAL LOW (ref >1.3)
Pneumo Ab Type 34 (10A)*: <0.1 ug/mL — ABNORMAL LOW (ref >1.3)
Pneumo Ab Type 4*: <0.1 ug/mL — ABNORMAL LOW (ref >1.3)
Pneumo Ab Type 43 (11A)*: <0.1 ug/mL — ABNORMAL LOW (ref >1.3)
Pneumo Ab Type 5*: <0.1 ug/mL — ABNORMAL LOW (ref >1.3)
Pneumo Ab Type 51 (7F)*: 0.2 ug/mL — ABNORMAL LOW (ref >1.3)
Pneumo Ab Type 54 (15B)*: 0.2 ug/mL — ABNORMAL LOW (ref >1.3)
Pneumo Ab Type 56 (18C)*: <0.1 ug/mL — ABNORMAL LOW (ref >1.3)
Pneumo Ab Type 57 (19A)*: 0.2 ug/mL — ABNORMAL LOW (ref >1.3)
Pneumo Ab Type 68 (9V)*: <0.1 ug/mL — ABNORMAL LOW (ref >1.3)
Pneumo Ab Type 70 (33F)*: 0.3 ug/mL — ABNORMAL LOW (ref >1.3)
Pneumo Ab Type 8*: <0.1 ug/mL — ABNORMAL LOW (ref >1.3)
Pneumo Ab Type 9 (9N)*: <0.1 ug/mL — ABNORMAL LOW (ref >1.3)

## 2019-10-07 NOTE — Addendum Note (Signed)
Addended by: Neomia Dear on: 10/07/2019 09:55 AM   Modules accepted: Orders

## 2019-10-12 ENCOUNTER — Telehealth: Payer: Self-pay

## 2019-10-12 NOTE — Telephone Encounter (Signed)
Spoke with pt. And she stated that her allergist wanted to know if it was fine for her to get the prevnar vacc? So that she can test her for that allergen. Salvatore Marvel, CMA

## 2019-10-12 NOTE — Telephone Encounter (Signed)
Dr Serenah Mill suspects Ms Montagnino should have a Pneumovax vaccination only, but Ms Garza should contact Dr Rexene Alberts (Allergist) to make certain this is what she would recommend based on .   If Dr [name] does recommend a Pneumovax, then our office would be glad to administer it.

## 2019-10-12 NOTE — Telephone Encounter (Signed)
Pt calls nurse line requesting to speak to Dr. McDiarmid regarding recent immunization titers. Pt recently received Tdap and pneumonia vaccine. Pt wants to know which vaccines she needs to receive at this time.   To PCP  Talbot Grumbling, RN

## 2019-10-13 ENCOUNTER — Telehealth: Payer: Medicare Other

## 2019-10-13 ENCOUNTER — Ambulatory Visit: Payer: Self-pay | Admitting: Licensed Clinical Social Worker

## 2019-10-13 ENCOUNTER — Other Ambulatory Visit: Payer: Self-pay

## 2019-10-13 NOTE — Chronic Care Management (AMB) (Signed)
    Clinical Social Work  Care Management Outreach   10/13/2019 Name: KAIRAH BRINKMEYER MRN: [id number] DOB: 1965-11-11  Tyna Jaksch is a 54 y.o. year old female who is a primary care patient of McDiarmid, Blane Ohara, MD .   LCSW reached out to Tyna Jaksch today by phone to follow up on care plan.   An unsuccessful telephone outreach was attempted today. Unable to leave message as phone was not working at this time.  Follow Up Plan: LCSW will call patient again in 24 to 48 hours  Casimer Lanius, [place] / [place]   [phone] 10:44 AM

## 2019-10-13 NOTE — Telephone Encounter (Signed)
Ask Andrea Berry to come by the [facility] for a nurse visit to receive a Pneumovax vaccination.

## 2019-10-13 NOTE — Telephone Encounter (Signed)
Patient calls back to schedule apt for Prevnar Vaccine. I scheduled patient for Friday, 2/5. Just to clarify, we are giving her Prevnar 13 and not PNA 23? Dr. [name] (allergist) [place] requesting Prevnar 62. Please clarify which PNA vaccine.

## 2019-10-15 NOTE — Telephone Encounter (Signed)
I would like Ms Hurry to receive the PSV-23 (Pneumovax) vaccination.  Thanks, [employer]

## 2019-10-16 ENCOUNTER — Encounter: Payer: Self-pay | Admitting: Family Medicine

## 2019-10-16 ENCOUNTER — Ambulatory Visit: Payer: Medicare Other

## 2019-10-16 ENCOUNTER — Other Ambulatory Visit: Payer: Self-pay

## 2019-10-16 NOTE — Progress Notes (Unsigned)
Andrea Berry received the PSV-23 pneumococcal vaccination at [facility] inNovember 2020.  Her lab titers results from January 2021 to the 23 pneumococcal serotypes showed poor immunologic response.  We will not be able to provide her a second PSV23 vaccination that would be covered by her Medicare insurance.  It has been greater than 8 weeks since the PSV-23 vaccination.   Will provide Prevnar-13 vaccination today to Andrea Berry which should be covered by Medicare.

## 2019-10-16 NOTE — Telephone Encounter (Signed)
Patient received pneumovax on 07/23/2019. After consulting with Dr. McDiarmid, patient is to receive prevnar- 93. Currently waiting on order to come in. Rescheduled nurse visit for next week.   Talbot Grumbling, RN

## 2019-10-19 ENCOUNTER — Other Ambulatory Visit: Payer: Self-pay

## 2019-10-19 ENCOUNTER — Ambulatory Visit (INDEPENDENT_AMBULATORY_CARE_PROVIDER_SITE_OTHER): Payer: Medicare Other | Admitting: *Deleted

## 2019-10-19 DIAGNOSIS — Z7189 Other specified counseling: Secondary | ICD-10-CM | POA: Diagnosis present

## 2019-10-19 DIAGNOSIS — Z23 Encounter for immunization: Secondary | ICD-10-CM | POA: Diagnosis not present

## 2019-10-19 DIAGNOSIS — Z7185 Encounter for immunization safety counseling: Secondary | ICD-10-CM

## 2019-10-19 NOTE — Progress Notes (Signed)
Pt is here for Prevnar 13 per Dr. Wendy Poet.  Given in right Deltoid and tolerated well. Christen Bame, CMA

## 2019-10-30 ENCOUNTER — Other Ambulatory Visit: Payer: Self-pay | Admitting: Family Medicine

## 2019-10-30 DIAGNOSIS — F419 Anxiety disorder, unspecified: Secondary | ICD-10-CM

## 2019-10-30 DIAGNOSIS — F418 Other specified anxiety disorders: Secondary | ICD-10-CM

## 2019-10-30 DIAGNOSIS — G894 Chronic pain syndrome: Secondary | ICD-10-CM

## 2019-10-30 DIAGNOSIS — G8929 Other chronic pain: Secondary | ICD-10-CM

## 2019-11-20 ENCOUNTER — Other Ambulatory Visit: Payer: Self-pay | Admitting: Family Medicine

## 2019-11-25 ENCOUNTER — Telehealth: Payer: Self-pay

## 2019-11-25 NOTE — Telephone Encounter (Signed)
Received phone call on nurse line from Dionisio David, RN with [facility]. Per RN, patient is enrolled in BP monitoring program. Over the last few weeks patient has had elevated Bps, ranging for [id number] systolic and [id number] diastolic. RN calls to inform PCP and see if patient needs medication management.    Called and spoke with patient regarding elevated Bps. Patient reports no symptoms, however, has been under a great deal of stress lately. Per patient she has had 2 brothers and a sister pass away within 3 months and currently has another brother in the hospital. Pt believes this could be contributing to her elevated BP. Asked patient if she would like to schedule a f/u appointment for elevated BP, however, patient would like to wait to hear plan from Dr. McDiarmid.   To PCP  Please advise  Talbot Grumbling, RN

## 2019-11-26 ENCOUNTER — Other Ambulatory Visit: Payer: Self-pay | Admitting: Family Medicine

## 2019-11-30 NOTE — Telephone Encounter (Signed)
Received follow up phone call from [facility] with [facility] regarding patient's recent elevated Bps. Patient and RN from [facility] were wondering if patient needs to schedule BP appointment follow up.   BP today: 116/80 and HR 94   To PCP  Please advise  Talbot Grumbling, RN

## 2019-12-01 NOTE — Telephone Encounter (Signed)
Spoke with pt made virtual visit for 4/22. To discuss BP.  Salvatore Marvel, CMA

## 2019-12-01 NOTE — Telephone Encounter (Signed)
Please let Andrea Berry from [facility] know that Andrea Berry would ask [name] to scheduled a televisit w/ him in the next 4-6 weeks to discuss her blood pressure and medications.

## 2019-12-08 ENCOUNTER — Other Ambulatory Visit: Payer: Self-pay | Admitting: Family Medicine

## 2019-12-08 DIAGNOSIS — F419 Anxiety disorder, unspecified: Secondary | ICD-10-CM

## 2019-12-28 ENCOUNTER — Telehealth: Payer: Self-pay | Admitting: Allergy

## 2019-12-28 NOTE — Telephone Encounter (Signed)
Left message for patient to call back  

## 2019-12-28 NOTE — Telephone Encounter (Signed)
Pt returned call.  Has received the pneumovax 23 vaccine in February.  Advised pt to come in any time this week to have labs drawn.  Pt verbalized understanding, call ended.

## 2019-12-28 NOTE — Telephone Encounter (Signed)
Please call patient.  I received a note from Dr. Manuella Ghazi.   Did she get her pneumovax vaccine yet? If so, please have her get repeat s pneumococcal titers drawn 4 weeks after vaccination.  Thank you.

## 2019-12-31 ENCOUNTER — Telehealth: Payer: Medicare Other | Admitting: Family Medicine

## 2020-01-04 ENCOUNTER — Other Ambulatory Visit: Payer: Self-pay | Admitting: *Deleted

## 2020-01-04 MED ORDER — IBANDRONATE SODIUM 150 MG PO TABS: 150.0000 mg | ORAL_TABLET | ORAL | 3 refills | Status: DC

## 2020-01-04 NOTE — Telephone Encounter (Signed)
Pharmacy is requesting this medication be refilled on behalf of the patient but they didn't have it on their list as an active medication. It was also not on our list as an active medication patient is taking.  Will forward to MD to advise. Aluel Schwarz,CMA

## 2020-01-05 ENCOUNTER — Other Ambulatory Visit: Payer: Self-pay | Admitting: *Deleted

## 2020-01-05 LAB — STREP PNEUMONIAE 23 SEROTYPES IGG
Pneumo Ab Type 1*: <0.1 ug/mL — ABNORMAL LOW (ref >1.3)
Pneumo Ab Type 12 (12F)*: <0.1 ug/mL — ABNORMAL LOW (ref >1.3)
Pneumo Ab Type 14*: 2.1 ug/mL (ref >1.3)
Pneumo Ab Type 17 (17F)*: <0.1 ug/mL — ABNORMAL LOW (ref >1.3)
Pneumo Ab Type 19 (19F)*: 0.3 ug/mL — ABNORMAL LOW (ref >1.3)
Pneumo Ab Type 2*: 0.5 ug/mL — ABNORMAL LOW (ref >1.3)
Pneumo Ab Type 20*: <0.1 ug/mL — ABNORMAL LOW (ref >1.3)
Pneumo Ab Type 22 (22F)*: <0.1 ug/mL — ABNORMAL LOW (ref >1.3)
Pneumo Ab Type 23 (23F)*: <0.1 ug/mL — ABNORMAL LOW (ref >1.3)
Pneumo Ab Type 26 (6B)*: 0.3 ug/mL — ABNORMAL LOW (ref >1.3)
Pneumo Ab Type 3*: 0.2 ug/mL — ABNORMAL LOW (ref >1.3)
Pneumo Ab Type 34 (10A)*: <0.1 ug/mL — ABNORMAL LOW (ref >1.3)
Pneumo Ab Type 4*: <0.1 ug/mL — ABNORMAL LOW (ref >1.3)
Pneumo Ab Type 43 (11A)*: <0.1 ug/mL — ABNORMAL LOW (ref >1.3)
Pneumo Ab Type 5*: <0.1 ug/mL — ABNORMAL LOW (ref >1.3)
Pneumo Ab Type 51 (7F)*: 0.1 ug/mL — ABNORMAL LOW (ref >1.3)
Pneumo Ab Type 54 (15B)*: 0.2 ug/mL — ABNORMAL LOW (ref >1.3)
Pneumo Ab Type 56 (18C)*: <0.1 ug/mL — ABNORMAL LOW (ref >1.3)
Pneumo Ab Type 57 (19A)*: <0.1 ug/mL — ABNORMAL LOW (ref >1.3)
Pneumo Ab Type 68 (9V)*: <0.1 ug/mL — ABNORMAL LOW (ref >1.3)
Pneumo Ab Type 70 (33F)*: 0.4 ug/mL — ABNORMAL LOW (ref >1.3)
Pneumo Ab Type 8*: <0.1 ug/mL — ABNORMAL LOW (ref >1.3)
Pneumo Ab Type 9 (9N)*: <0.1 ug/mL — ABNORMAL LOW (ref >1.3)

## 2020-01-06 MED ORDER — ALENDRONATE SODIUM 70 MG PO TABS: ORAL_TABLET | ORAL | 3 refills | Status: DC

## 2020-01-14 ENCOUNTER — Encounter: Payer: Self-pay | Admitting: Family Medicine

## 2020-01-14 ENCOUNTER — Other Ambulatory Visit: Payer: Self-pay

## 2020-01-14 ENCOUNTER — Telehealth (INDEPENDENT_AMBULATORY_CARE_PROVIDER_SITE_OTHER): Payer: Medicare Other | Admitting: Family Medicine

## 2020-01-14 DIAGNOSIS — G8929 Other chronic pain: Secondary | ICD-10-CM

## 2020-01-14 DIAGNOSIS — D6862 Lupus anticoagulant syndrome: Secondary | ICD-10-CM | POA: Diagnosis not present

## 2020-01-14 DIAGNOSIS — M792 Neuralgia and neuritis, unspecified: Secondary | ICD-10-CM

## 2020-01-14 DIAGNOSIS — Z86711 Personal history of pulmonary embolism: Secondary | ICD-10-CM

## 2020-01-14 DIAGNOSIS — M8580 Other specified disorders of bone density and structure, unspecified site: Secondary | ICD-10-CM

## 2020-01-14 MED ORDER — ALENDRONATE SODIUM 70 MG PO TABS: ORAL_TABLET | ORAL | 3 refills | Status: DC

## 2020-01-14 MED ORDER — RIVAROXABAN 10 MG PO TABS: 10.0000 mg | ORAL_TABLET | Freq: Every day | ORAL | 1 refills | Status: DC

## 2020-01-14 NOTE — Progress Notes (Addendum)
[facility] / E- Visit   This visit type was conducted due to national recommendations for restrictions regarding the COVID-19 Pandemic (e.g. social distancing) in an effort to limit this patient's exposure and mitigate transmission in our community.   Due to their co-morbid illnesses, this patient is at least at moderate risk for complications without adequate follow up: yes   This format is felt to be most appropriate for this patient at this time.  All issues noted in this document were discussed and addressed.  A limited physical exam was performed with this format.   Patient's identity was confirmed using her Full Name and DOB.  We discussed the limitations of evaluation and management by telemedicine and the availability of in person appointments.  We discussed the limitations in assessment, security and privacy with performing an evaluation and management service by telephone and the availability of in person appointments.  We discussed with the patient that there may be a patient responsible charge related to this service.  The patient expressed understanding and agreed to proceed.   Encounter participants: Patient: Andrea Berry  Patient Location: Home Provider: Sherren Mocha Diora Bellizzi at office  Others (if applicable): none  ====================================================================================================================================================================================================================================================================================================  Chief Complaint: medication question  HPI:  Prednisone-induced osteoporosis - longstanding issue - Pt had received Boniva instead of alendronate and taken once a week for two weeks. No GI upset. - Patient should have received alendronate.  Request from pharmacy had been for [place].   DEPRESSION Disease Monitoring Current symptoms include  depressed mood and fatigue            Symptoms have been controlled     Is Exercising no  Evidence of suicidal ideation: no  Medication Monitoring Compliance: (+) taking Duloxetine as prescribed GI symptoms no  Chronic Pain - Longstanding - Tolerating tramadol and Pregabalin  No constipation,  No change in memory. No falls - Adequate pain control currently.  Periods of pain flares  Insomnia - Longstanding - Adequate control with lorazepam 0.5 mg at bedtime - see chronic pain for symptoms of med adverse effects  Anticoagulation - on DOAC For VTE prevention given APS and hx of PE - No epistaxis or oral bleeds.     Respiratory: speaking in full sentence, no audible wheeze  Assessment/Plan:  No problem-specific Assessment & Plan notes found for this encounter.    Follow Up Instructions:     We discussed the assessment and treatment plan with the patient. The patient was provided an opportunity to ask questions and all were answered. The patient agreed with the plan and demonstrated an understanding of the instructions.   The patient was advised to call back or seek an in-person evaluation if the symptoms worsen or if the condition fails to improve as anticipated.  COVID-19 Education: The signs and symptoms of COVID-19 were discussed with the patient and how to seek care for testing (follow up with PCP or arrange E-visit).  The importance of social distancing was discussed today.  Time spent on phone with patient: 15 minutes

## 2020-01-15 ENCOUNTER — Encounter: Payer: Self-pay | Admitting: Family Medicine

## 2020-01-15 NOTE — Assessment & Plan Note (Signed)
Established problem. Stable. Continue current [employer]

## 2020-01-15 NOTE — Assessment & Plan Note (Addendum)
Established problem. Stable. No evidence of Dr Lisette Grinder on review PDMP db Continuing of tramadol, pregabalin Rheumatologist at [facility] recommended consulting her pain clinic physicians for consideration of spinal injections for her chronic back pain.

## 2020-01-15 NOTE — Assessment & Plan Note (Addendum)
Established problem Boniva dispensed rather than alendronate. Rx sent in for Alendronate 70 mg weekly.  Patient is not to take Boniva.

## 2020-01-18 ENCOUNTER — Other Ambulatory Visit: Payer: Self-pay

## 2020-01-18 ENCOUNTER — Ambulatory Visit (INDEPENDENT_AMBULATORY_CARE_PROVIDER_SITE_OTHER): Payer: Medicare Other | Admitting: Allergy

## 2020-01-18 ENCOUNTER — Encounter: Payer: Self-pay | Admitting: Allergy

## 2020-01-18 VITALS — BP 126/86 | HR 85 | Temp 97.6°F | Resp 18

## 2020-01-18 DIAGNOSIS — J849 Interstitial pulmonary disease, unspecified: Secondary | ICD-10-CM

## 2020-01-18 DIAGNOSIS — D806 Antibody deficiency with near-normal immunoglobulins or with hyperimmunoglobulinemia: Secondary | ICD-10-CM | POA: Insufficient documentation

## 2020-01-18 DIAGNOSIS — J310 Chronic rhinitis: Secondary | ICD-10-CM

## 2020-01-18 DIAGNOSIS — D808 Other immunodeficiencies with predominantly antibody defects: Secondary | ICD-10-CM

## 2020-01-18 NOTE — Assessment & Plan Note (Addendum)
Past history - Bloodwork showed poor s pneumo titers and absent diptheria titer. Normal IgG and IgA. Low IgM. Received Tdap on 04/03/2019 and 07/16/2018; Pneumovax on 07/23/2019, Prevnar on 10/19/2019.  Interim history - No additional infections/antibiotics use.   Repeat s. Pneumococcal titers post pneumovax and Prevnar vaccine were still inadequate - most likely due to being on rituximab suppressing the vaccine response.   Patient's IgG levels were normal in the past which is great. She did have low IgM.   Given this scenario, I'm not sure how additional immunosuppression therapy will affect her immune system and her ability to fight off infections. She has not had any recent infections or antibiotics use which is great. If there is a significant benefit for the patient in adding another immunosuppressant it may be worthwhile to do a trial. Recommend NOT starting during upper respiratory season though.  Keep track of infections.

## 2020-01-18 NOTE — Assessment & Plan Note (Signed)
Saw pulmonology in the past. Coughing is 80% improved.   Monitor symptoms.

## 2020-01-18 NOTE — Patient Instructions (Addendum)
Chronic coughing Monitor symptoms.   Chronic rhinitis  Continue Flonase 1-2 sprays per nostril daily for nasal congestion.   Continue azelastine 2 sprays per nostril twice a day for post nasal drip.  Nasal saline spray (i.e., Simply Saline) or nasal saline lavage (i.e., NeilMed) is recommended as needed and prior to medicated nasal sprays.   Keep track of infections.  I will send a note to Dr. Manuella Ghazi as well.   Follow up in 6 months or sooner if needed.

## 2020-01-18 NOTE — Progress Notes (Signed)
Follow Up Note  RE: Andrea Berry MRN: [id number] DOB: May 31, 1966 Date of Office Visit: 01/18/2020  Referring provider: McDiarmid, Blane Ohara, MD Primary care provider: McDiarmid, Blane Ohara, MD  Chief Complaint: Follow-up  History of Present Illness: I had the pleasure of seeing Andrea Berry for a follow up visit at the [facility] [place] [facility] [place] on 01/18/2020. She is a 54 y.o. female, who is being followed for chronic coughing, chronic rhinitis. Her previous allergy office visit was on 09/16/2019 with Dr. [name] via telemedicine. Today is a regular follow up visit.  Chronic coughing  Coughing is about 80% improved. Currently on no inhalers.   Chronic rhinitis Currently on Flonase and azelastine 1 spray BID. Sometimes still has some rhinorrhea.  Infections No infections or antibiotics since the last visit.  Up to date with covid-19 vaccination.  Assessment and Plan: Andrea Berry is a 54 y.o. female with: Chronic rhinitis Past history: 2020 skin testing was negative to environmental allergy panel and bloodwork IgE <2, full panel not run due to lack of sample but no allergens noted.  Interim history - some rhinorrhea still but mostly controlled with below regimen.  Continue Flonase 1-2 sprays per nostril daily for nasal congestion.   Continue azelastine 2 sprays per nostril twice a day for post nasal drip.  Nasal saline spray (i.e., Simply Saline) or nasal saline lavage (i.e., NeilMed) is recommended as needed and prior to medicated nasal sprays.  Interstitial lung disease ([place]) Saw pulmonology in the past. Coughing is 80% improved.   Monitor symptoms.   Specific antibody deficiency Past history - Bloodwork showed poor s pneumo titers and absent diptheria titer. Normal IgG and IgA. Low IgM. Received Tdap on 04/03/2019 and 07/16/2018; Pneumovax on 07/23/2019, Prevnar on 10/19/2019.  Interim history - No additional infections/antibiotics use.   Repeat s. Pneumococcal titers post pneumovax  and Prevnar vaccine were still inadequate - most likely due to being on rituximab suppressing the vaccine response.   Patient's IgG levels were normal in the past which is great. She did have low IgM.   Given this scenario, I'm not sure how additional immunosuppression therapy will affect her immune system and her ability to fight off infections. She has not had any recent infections or antibiotics use which is great. If there is a significant benefit for the patient in adding another immunosuppressant it may be worthwhile to do a trial. Recommend NOT starting during upper respiratory season though.  Keep track of infections.  Return in about 6 months (around 07/20/2020).  Diagnostics: None.  Medication List:  Current Outpatient Medications  Medication Sig Dispense Refill  . alendronate (FOSAMAX) 70 MG tablet TAKE 1 TABLET BY MOUTH IN THE MORNING EVERY 7 DAYS 12 tablet 3  . azelastine (ASTELIN) 0.1 % nasal spray Place 2 sprays into both nostrils 2 (two) times daily as needed for rhinitis. 30 mL 5  . DULoxetine (CYMBALTA) 60 MG capsule TAKE 1 CAPSULE BY MOUTH EVERY DAY 90 capsule 3  . esomeprazole (NEXIUM) 40 MG capsule TAKE 1 CAPSULE BY MOUTH TWICE A DAY 180 capsule 1  . fluticasone (FLONASE) 50 MCG/ACT nasal spray Place into the nose.    Marland Kitchen ipratropium (ATROVENT) 0.03 % nasal spray PLACE 2 SPRAYS INTO BOTH NOSTRILS EVERY 12 (TWELVE) HOURS. 30 mL 3  . linaclotide (LINZESS) 145 MCG CAPS capsule TAKE ONE CAPSULE EVERY DAY ON EMPTY STOMACH AT LEAST 30MIN BEFORE 1ST MEAL OF THE DAY 90 capsule 1  . LORazepam (ATIVAN) 0.5 MG  tablet TAKE 1 TABLET (0.5 MG TOTAL) BY MOUTH 2 (TWO) TIMES DAILY AS NEEDED FOR ANXIETY. 30 tablet 5  . OXcarbazepine (TRILEPTAL) 150 MG tablet Take 150 mg by mouth 3 (three) times daily.    . predniSONE (DELTASONE) 5 MG tablet Take 1 tablet (5 mg total) by mouth daily. 90 tablet 1  . pregabalin (LYRICA) 200 MG capsule TAKE 1 CAPSULE BY MOUTH THREE TIMES A DAY 90 capsule 0  .  RiTUXimab (RITUXAN IV) Inject 1,000 mg into the vein.    . rivaroxaban (XARELTO) 10 MG TABS tablet Take 1 tablet (10 mg total) by mouth daily. With a meal 90 tablet 1  . traMADol (ULTRAM) 50 MG tablet TAKE 1 TABLET (50 MG TOTAL) BY MOUTH EVERY 6 (SIX) HOURS AS NEEDED. 60 tablet 1   No current facility-administered medications for this visit.   Allergies: No Known Allergies I reviewed her past medical history, social history, family history, and environmental history and no significant changes have been reported from her previous visit.  Review of Systems  Constitutional: Negative for appetite change, chills, fever and unexpected weight change.  HENT: Positive for postnasal drip and rhinorrhea. Negative for congestion.   Eyes: Negative for itching.  Respiratory: Positive for cough. Negative for chest tightness, shortness of breath and wheezing.   Cardiovascular: Negative for chest pain.  Gastrointestinal: Negative for abdominal pain.  Genitourinary: Negative for difficulty urinating.  Skin: Negative for rash.  Allergic/Immunologic: Negative for environmental allergies and food allergies.  Neurological: Negative for headaches.   Objective: BP 126/86   Pulse 85   Temp 97.6 F (36.4 C) (Temporal)   Resp 18   SpO2 98%  There is no height or weight on file to calculate BMI. Physical Exam  Constitutional: She is oriented to person, place, and time. She appears well-developed and well-nourished.  HENT:  Head: Normocephalic and atraumatic.  Right Ear: External ear normal.  Left Ear: External ear normal.  Nose: Nose normal.  Mouth/Throat: Oropharynx is clear and moist.  Eyes: Conjunctivae and EOM are normal.  Cardiovascular: Normal rate, regular rhythm and normal heart sounds. Exam reveals no gallop and no friction rub.  No murmur heard. Pulmonary/Chest: Effort normal and breath sounds normal. She has no wheezes. She has no rales.  Musculoskeletal:     Cervical back: Neck supple.    Neurological: She is alert and oriented to person, place, and time.  Skin: Skin is warm. No rash noted.  Psychiatric: She has a normal mood and affect. Her behavior is normal.  Nursing note and vitals reviewed.  Previous notes and tests were reviewed. The plan was reviewed with the patient/family, and all questions/concerned were addressed.  It was my pleasure to see Andrea Berry today and participate in her care. Please feel free to contact me with any questions or concerns.  Sincerely,  Rexene Alberts, DO Allergy & Immunology  [facility] office: [phone] [facility] office: [place] office: [phone]

## 2020-01-18 NOTE — Assessment & Plan Note (Signed)
Past history: 2020 skin testing was negative to environmental allergy panel and bloodwork IgE <2, full panel not run due to lack of sample but no allergens noted.  Interim history - some rhinorrhea still but mostly controlled with below regimen.  Continue Flonase 1-2 sprays per nostril daily for nasal congestion.   Continue azelastine 2 sprays per nostril twice a day for post nasal drip.  Nasal saline spray (i.e., Simply Saline) or nasal saline lavage (i.e., NeilMed) is recommended as needed and prior to medicated nasal sprays.

## 2020-01-22 ENCOUNTER — Other Ambulatory Visit: Payer: Self-pay | Admitting: Family Medicine

## 2020-01-22 DIAGNOSIS — Z1231 Encounter for screening mammogram for malignant neoplasm of breast: Secondary | ICD-10-CM

## 2020-01-26 ENCOUNTER — Other Ambulatory Visit: Payer: Self-pay | Admitting: Family Medicine

## 2020-01-26 DIAGNOSIS — K5900 Constipation, unspecified: Secondary | ICD-10-CM

## 2020-02-04 ENCOUNTER — Telehealth: Payer: Self-pay | Admitting: Family Medicine

## 2020-02-04 ENCOUNTER — Inpatient Hospital Stay (HOSPITAL_COMMUNITY)
Admission: EM | Admit: 2020-02-04 | Discharge: 2020-02-09 | DRG: 177 | Disposition: A | Discharge status: Home / Self Care | Payer: Medicare Other | Attending: Family Medicine | Admitting: Family Medicine

## 2020-02-04 ENCOUNTER — Encounter (HOSPITAL_COMMUNITY): Payer: Self-pay | Admitting: Emergency Medicine

## 2020-02-04 ENCOUNTER — Emergency Department (HOSPITAL_COMMUNITY): Payer: Medicare Other

## 2020-02-04 ENCOUNTER — Other Ambulatory Visit: Payer: Self-pay

## 2020-02-04 DIAGNOSIS — Z7952 Long term (current) use of systemic steroids: Secondary | ICD-10-CM

## 2020-02-04 DIAGNOSIS — G4700 Insomnia, unspecified: Secondary | ICD-10-CM | POA: Diagnosis present

## 2020-02-04 DIAGNOSIS — G360 Neuromyelitis optica [Devic]: Secondary | ICD-10-CM | POA: Diagnosis present

## 2020-02-04 DIAGNOSIS — J45909 Unspecified asthma, uncomplicated: Secondary | ICD-10-CM | POA: Diagnosis present

## 2020-02-04 DIAGNOSIS — F39 Unspecified mood [affective] disorder: Secondary | ICD-10-CM | POA: Diagnosis present

## 2020-02-04 DIAGNOSIS — G894 Chronic pain syndrome: Secondary | ICD-10-CM | POA: Diagnosis present

## 2020-02-04 DIAGNOSIS — D638 Anemia in other chronic diseases classified elsewhere: Secondary | ICD-10-CM | POA: Diagnosis present

## 2020-02-04 DIAGNOSIS — U071 COVID-19: Secondary | ICD-10-CM | POA: Diagnosis not present

## 2020-02-04 DIAGNOSIS — Z79899 Other long term (current) drug therapy: Secondary | ICD-10-CM

## 2020-02-04 DIAGNOSIS — J1282 Pneumonia due to coronavirus disease 2019: Secondary | ICD-10-CM | POA: Diagnosis present

## 2020-02-04 DIAGNOSIS — Z86711 Personal history of pulmonary embolism: Secondary | ICD-10-CM

## 2020-02-04 DIAGNOSIS — M329 Systemic lupus erythematosus, unspecified: Secondary | ICD-10-CM | POA: Diagnosis present

## 2020-02-04 DIAGNOSIS — J9601 Acute respiratory failure with hypoxia: Secondary | ICD-10-CM | POA: Diagnosis present

## 2020-02-04 DIAGNOSIS — M818 Other osteoporosis without current pathological fracture: Secondary | ICD-10-CM | POA: Diagnosis present

## 2020-02-04 DIAGNOSIS — R55 Syncope and collapse: Secondary | ICD-10-CM

## 2020-02-04 DIAGNOSIS — T380X5A Adverse effect of glucocorticoids and synthetic analogues, initial encounter: Secondary | ICD-10-CM | POA: Diagnosis present

## 2020-02-04 DIAGNOSIS — Z7901 Long term (current) use of anticoagulants: Secondary | ICD-10-CM

## 2020-02-04 DIAGNOSIS — I10 Essential (primary) hypertension: Secondary | ICD-10-CM | POA: Diagnosis present

## 2020-02-04 DIAGNOSIS — Z8673 Personal history of transient ischemic attack (TIA), and cerebral infarction without residual deficits: Secondary | ICD-10-CM

## 2020-02-04 DIAGNOSIS — J9600 Acute respiratory failure, unspecified whether with hypoxia or hypercapnia: Secondary | ICD-10-CM

## 2020-02-04 DIAGNOSIS — H548 Legal blindness, as defined in USA: Secondary | ICD-10-CM | POA: Diagnosis present

## 2020-02-04 HISTORY — DX: COVID-19: U07.1

## 2020-02-04 LAB — CBC
HCT: 37.8 % (ref 36.0–46.0)
HCT: 36.3 % (ref 36.0–46.0)
Hemoglobin: 11.5 g/dL — ABNORMAL LOW (ref 12.0–15.0)
Hemoglobin: 10.8 g/dL — ABNORMAL LOW (ref 12.0–15.0)
MCH: 21.9 pg — ABNORMAL LOW (ref 26.0–34.0)
MCH: 21.8 pg — ABNORMAL LOW (ref 26.0–34.0)
MCHC: 30.4 g/dL (ref 30.0–36.0)
MCHC: 29.8 g/dL — ABNORMAL LOW (ref 30.0–36.0)
MCV: 72 fL — ABNORMAL LOW (ref 80.0–100.0)
MCV: 73.2 fL — ABNORMAL LOW (ref 80.0–100.0)
Platelets: 221 10*3/uL (ref 150–400)
Platelets: 203 10*3/uL (ref 150–400)
RBC: 5.25 MIL/uL — ABNORMAL HIGH (ref 3.87–5.11)
RBC: 4.96 MIL/uL (ref 3.87–5.11)
RDW: 14.9 % (ref 11.5–15.5)
RDW: 15.1 % (ref 11.5–15.5)
WBC: 4 10*3/uL (ref 4.0–10.5)
WBC: 4.3 10*3/uL (ref 4.0–10.5)
nRBC: 0 % (ref 0.0–0.2)
nRBC: 0 % (ref 0.0–0.2)

## 2020-02-04 LAB — DIFFERENTIAL
Abs Immature Granulocytes: 0.03 10*3/uL (ref 0.00–0.07)
Basophils Absolute: 0 10*3/uL (ref 0.0–0.1)
Basophils Relative: 0 %
Eosinophils Absolute: 0 10*3/uL (ref 0.0–0.5)
Eosinophils Relative: 1 %
Immature Granulocytes: 1 %
Lymphocytes Relative: 20 %
Lymphs Abs: 0.8 10*3/uL (ref 0.7–4.0)
Monocytes Absolute: 0.5 10*3/uL (ref 0.1–1.0)
Monocytes Relative: 11 %
Neutro Abs: 2.8 10*3/uL (ref 1.7–7.7)
Neutrophils Relative %: 67 %

## 2020-02-04 LAB — COMPREHENSIVE METABOLIC PANEL
ALT: 34 U/L (ref 0–44)
AST: 51 U/L — ABNORMAL HIGH (ref 15–41)
Albumin: 3.6 g/dL (ref 3.5–5.0)
Alkaline Phosphatase: 76 U/L (ref 38–126)
Anion gap: 11 (ref 5–15)
BUN: 11 mg/dL (ref 6–20)
CO2: 20 mmol/L — ABNORMAL LOW (ref 22–32)
Calcium: 8.6 mg/dL — ABNORMAL LOW (ref 8.9–10.3)
Chloride: 101 mmol/L (ref 98–111)
Creatinine, Ser: 1.07 mg/dL — ABNORMAL HIGH (ref 0.44–1.00)
GFR calc Af Amer: >60 mL/min (ref >60)
GFR calc non Af Amer: >60 mL/min (ref >60)
Glucose, Bld: 101 mg/dL — ABNORMAL HIGH (ref 70–99)
Potassium: 3.7 mmol/L (ref 3.5–5.1)
Sodium: 132 mmol/L — ABNORMAL LOW (ref 135–145)
Total Bilirubin: 0.5 mg/dL (ref 0.3–1.2)
Total Protein: 7 g/dL (ref 6.5–8.1)

## 2020-02-04 LAB — D-DIMER, QUANTITATIVE: D-Dimer, Quant: 0.58 ug/mL-FEU — ABNORMAL HIGH (ref 0.00–0.50)

## 2020-02-04 LAB — I-STAT BETA HCG BLOOD, ED (MC, WL, AP ONLY): I-stat hCG, quantitative: <5 m[IU]/mL (ref <5)

## 2020-02-04 LAB — PROCALCITONIN: Procalcitonin: <0.1 ng/mL

## 2020-02-04 LAB — TRIGLYCERIDES: Triglycerides: 201 mg/dL — ABNORMAL HIGH (ref <150)

## 2020-02-04 LAB — SARS CORONAVIRUS 2 BY RT PCR (HOSPITAL ORDER, PERFORMED IN ~~LOC~~ HOSPITAL LAB): SARS Coronavirus 2: POSITIVE — AB

## 2020-02-04 LAB — C-REACTIVE PROTEIN: CRP: 4.3 mg/dL — ABNORMAL HIGH (ref <1.0)

## 2020-02-04 LAB — LIPASE, BLOOD: Lipase: 23 U/L (ref 11–51)

## 2020-02-04 LAB — LACTIC ACID, PLASMA: Lactic Acid, Venous: 1.5 mmol/L (ref 0.5–1.9)

## 2020-02-04 LAB — FERRITIN: Ferritin: 151 ng/mL (ref 11–307)

## 2020-02-04 LAB — LACTATE DEHYDROGENASE: LDH: 275 U/L — ABNORMAL HIGH (ref 98–192)

## 2020-02-04 LAB — FIBRINOGEN: Fibrinogen: 740 mg/dL — ABNORMAL HIGH (ref 210–475)

## 2020-02-04 MED ORDER — IPRATROPIUM BROMIDE 0.06 % NA SOLN
2.0000 | Freq: Two times a day (BID) | NASAL | Status: DC
  Administered 2020-02-05 – 2020-02-09 (×9): 2 via NASAL
  Filled 2020-02-04: qty 30
  Filled 2020-02-04: qty 15

## 2020-02-04 MED ORDER — FLUTICASONE PROPIONATE 50 MCG/ACT NA SUSP
1.0000 | Freq: Every day | NASAL | Status: DC
  Administered 2020-02-05 – 2020-02-09 (×5): 1 via NASAL
  Filled 2020-02-04: qty 16

## 2020-02-04 MED ORDER — LORAZEPAM 0.5 MG PO TABS: 0.5000 mg | ORAL_TABLET | Freq: Every evening | ORAL | Status: DC | PRN

## 2020-02-04 MED ORDER — SODIUM CHLORIDE 0.9 % IV BOLUS
500.0000 mL | Freq: Once | INTRAVENOUS | Status: AC
  Administered 2020-02-04: 500 mL via INTRAVENOUS

## 2020-02-04 MED ORDER — SODIUM CHLORIDE 0.9 % IV SOLN: 250.0000 mL | INTRAVENOUS | Status: DC | PRN

## 2020-02-04 MED ORDER — SODIUM CHLORIDE 0.9% FLUSH
3.0000 mL | Freq: Two times a day (BID) | INTRAVENOUS | Status: DC
  Administered 2020-02-05 – 2020-02-07 (×5): 3 mL via INTRAVENOUS

## 2020-02-04 MED ORDER — FAMOTIDINE 20 MG PO TABS
40.0000 mg | ORAL_TABLET | Freq: Two times a day (BID) | ORAL | Status: DC
  Administered 2020-02-05 – 2020-02-09 (×10): 40 mg via ORAL
  Filled 2020-02-04 (×10): qty 2

## 2020-02-04 MED ORDER — POLYETHYLENE GLYCOL 3350 17 G PO PACK: 17.0000 g | PACK | Freq: Every day | ORAL | Status: DC | PRN

## 2020-02-04 MED ORDER — MELATONIN 3 MG PO TABS
3.0000 mg | ORAL_TABLET | Freq: Every evening | ORAL | Status: DC | PRN
  Administered 2020-02-05: 3 mg via ORAL
  Filled 2020-02-04: qty 1

## 2020-02-04 MED ORDER — SODIUM CHLORIDE 0.9 % IV SOLN
200.0000 mg | Freq: Once | INTRAVENOUS | Status: AC
  Administered 2020-02-04: 200 mg via INTRAVENOUS
  Filled 2020-02-04: qty 40

## 2020-02-04 MED ORDER — SODIUM CHLORIDE 0.9 % IV SOLN
100.0000 mg | Freq: Every day | INTRAVENOUS | Status: AC
  Administered 2020-02-05 – 2020-02-08 (×4): 100 mg via INTRAVENOUS
  Filled 2020-02-04 (×5): qty 20

## 2020-02-04 MED ORDER — ONDANSETRON HCL 4 MG/2ML IJ SOLN
4.0000 mg | Freq: Once | INTRAMUSCULAR | Status: AC
  Administered 2020-02-04: 4 mg via INTRAVENOUS
  Filled 2020-02-04: qty 2

## 2020-02-04 MED ORDER — OXCARBAZEPINE 150 MG PO TABS
150.0000 mg | ORAL_TABLET | Freq: Every day | ORAL | Status: DC
  Administered 2020-02-05 – 2020-02-09 (×5): 150 mg via ORAL
  Filled 2020-02-04 (×5): qty 1

## 2020-02-04 MED ORDER — ACETAMINOPHEN 650 MG RE SUPP: 650.0000 mg | Freq: Four times a day (QID) | RECTAL | Status: DC | PRN

## 2020-02-04 MED ORDER — ACETAMINOPHEN 325 MG PO TABS
650.0000 mg | ORAL_TABLET | Freq: Four times a day (QID) | ORAL | Status: DC | PRN
  Administered 2020-02-05 – 2020-02-07 (×2): 650 mg via ORAL
  Filled 2020-02-04 (×2): qty 2

## 2020-02-04 MED ORDER — LINACLOTIDE 145 MCG PO CAPS
145.0000 ug | ORAL_CAPSULE | Freq: Every day | ORAL | Status: DC
  Administered 2020-02-05 – 2020-02-09 (×5): 145 ug via ORAL
  Filled 2020-02-04 (×6): qty 1

## 2020-02-04 MED ORDER — PREGABALIN 100 MG PO CAPS
200.0000 mg | ORAL_CAPSULE | Freq: Three times a day (TID) | ORAL | Status: DC
  Administered 2020-02-05 – 2020-02-09 (×14): 200 mg via ORAL
  Filled 2020-02-04 (×14): qty 2

## 2020-02-04 MED ORDER — DEXAMETHASONE SODIUM PHOSPHATE 10 MG/ML IJ SOLN
6.0000 mg | Freq: Once | INTRAMUSCULAR | Status: AC
  Administered 2020-02-04: 6 mg via INTRAVENOUS
  Filled 2020-02-04: qty 1

## 2020-02-04 MED ORDER — DULOXETINE HCL 60 MG PO CPEP
60.0000 mg | ORAL_CAPSULE | Freq: Every day | ORAL | Status: DC
  Administered 2020-02-05 – 2020-02-09 (×5): 60 mg via ORAL
  Filled 2020-02-04 (×6): qty 1

## 2020-02-04 MED ORDER — RIVAROXABAN 10 MG PO TABS
10.0000 mg | ORAL_TABLET | Freq: Every day | ORAL | Status: DC
  Administered 2020-02-05 – 2020-02-09 (×5): 10 mg via ORAL
  Filled 2020-02-04 (×6): qty 1

## 2020-02-04 MED ORDER — PANTOPRAZOLE SODIUM 40 MG PO TBEC
40.0000 mg | DELAYED_RELEASE_TABLET | Freq: Every day | ORAL | Status: DC
  Administered 2020-02-05 – 2020-02-09 (×5): 40 mg via ORAL
  Filled 2020-02-04 (×5): qty 1

## 2020-02-04 MED ORDER — OXCARBAZEPINE 300 MG PO TABS
300.0000 mg | ORAL_TABLET | Freq: Every morning | ORAL | Status: DC
  Administered 2020-02-05 – 2020-02-09 (×5): 300 mg via ORAL
  Filled 2020-02-04 (×7): qty 1

## 2020-02-04 MED ORDER — OXCARBAZEPINE 300 MG PO TABS
300.0000 mg | ORAL_TABLET | Freq: Every day | ORAL | Status: DC
  Administered 2020-02-05 – 2020-02-08 (×4): 300 mg via ORAL
  Filled 2020-02-04 (×5): qty 1

## 2020-02-04 MED ORDER — AZELASTINE HCL 0.1 % NA SOLN
2.0000 | Freq: Two times a day (BID) | NASAL | Status: DC | PRN
  Filled 2020-02-04: qty 30

## 2020-02-04 MED ORDER — NON FORMULARY: 1.0000 [drp] | Freq: Two times a day (BID) | Status: DC

## 2020-02-04 MED ORDER — SODIUM CHLORIDE 0.9% FLUSH: 3.0000 mL | INTRAVENOUS | Status: DC | PRN

## 2020-02-04 NOTE — ED Notes (Signed)
pts pastor/caregiver, April, would like an update. [phone]

## 2020-02-04 NOTE — ED Triage Notes (Signed)
Pt arrives via ptar from home for c/o nausea and fatigue after having positive covid test 2 days ago. Called her pcp who advised her to come to the ED. EMS vss. Denies sob or cp. Pt also visually impaired.

## 2020-02-04 NOTE — H&P (Signed)
[facility] Admission History and Physical Service Pager: [phone]  Patient name: Andrea Berry Medical record number: [id number] Date of birth: [birth year] Age: 54 y.o. Gender: female  Primary Care Provider: McDiarmid, Blane Ohara, MD Consultants: None  Code Status: Prior  Preferred Emergency Contact :    Contact Information    Name Relation Home Work Ideal Son [phone]  [phone]   Donne Anon [id number]     ahligo,francoise Other   [phone]      Chief Complaint: Nausea and Fatigue   Assessment and Plan: Andrea Berry is a 54 y.o. female who presented with shortness of breath, feeling unwell and was admitted for COVID-19. Past medical history significant for legal blindness, lupus, chronic interstitial lung disease, history of pulmonary embolism on xarelto, chronic central neuropathic pain, mild cognitive impairment, mood disorder, neuromyelitis optica, steroid-induced osteopenia, migraine without aura,  COVID 19 Patient reports that she has been feeling unwell for the last few days.  She does have positive Covid contacts at home.  Patient did finish her Covid vaccinations about a month ago.  Patient reports that she is having mildly increased work of breathing as compared to her baseline in the setting of her chronic lung disease.  At home, she does not use any oxygen.  She is satting in the low 90s on 2 L on exam.  She has crackles at her lower bases bilaterally.  On chest x-ray, patient has increased interstitial markings and possible lower left base Infiltrate.  Her last comparable chest x-ray was in February 2020 which appears very differently from current in multiple ways.  I have low suspicion for pneumonia at this time given procalcitonin and no leukocytosis.  She does have fever and chronic cough which may confound a pneumonia diagnosis.  However, given Covid, there is an explanation for the fever.  In the ED, she was  given half liter bolus, remdesivir, Decadron.  Vital signs are stable.  Will admit patient for new oxygen requirement in the setting of COVID-19 and chronic lung disease.  Plan to continue Covid medications. . Admit to FMTS, attending Dr. Ardelia Mems. Level of care: med surg . Continuous cardiac monitoring  . Vitals per unit routine, O2 > 92%, Activity out of bed with assistance,  . Follow up blood cultures . AM Labs CMP, CBC . Continue remdesivir and Decadron daily  Fall, weakness  Per notes, history obtained seems that patient had a syncopal episode.  However, when I speak to patient, she reports that she was coming back from the bathroom and thought she was going to sit on her bed but missed the bed.  She denies losing any consciousness or hitting her head.  I confirmed this with patient's son who reports that because of her different illnesses, sometimes she gets weak in her legs which comes and goes. Additionally, she has not been eating much due to feeling ill.  Patient CT scan was within normal limits in the ED.  She did not have any focal neurologic deficits on exam except for baseline sensory issues in extremities.  Will consult PT OT for any recommendations  Neuromyelitis optica Legally blind Currently follows with ophthalmology.  No recent acute changes in vision.  Continue home ophthalmic solution  SLE Followed by [facility]ology at [facility].  Currently not taking hydroxychloroquine per her ophthalmologist.  She is currently on prednisone 5 mg and obtains rituximab infusions every 6 months.  Patient reports not taking her medication over  the last 3 days and this is confirmed by her son.  She receives 6 mg of Decadron in the ED. we will continue Decadron for Covid and hold her home prednisone at this time.  Continue Decadron  Hold home prednisone  History PE Occurred in 2017, currently on Xarelto 10 mg daily.  Patient has tested negative for antiphospholipid syndrome.  Continue  Xarelto daily  Hx Transverse myelitis, Devic's Disease, 2018 Neuromyelitis optica Severe neuropathic pain Patient developed pain in 2017 and has been present since.  She has chronic left upper and lower extremity weakness.  She has tried several medications including tramadol, gabapentin and Lyrica. Patient was put on Trileptal in 2019 by her neurologist  Continue  Scheduled Trileptal, Lyrica,   Home tramadol as needed   Chronic interstitial lung disease Follows with pulmonology, asthma and allergy.  Per chart review, PFT on 11/24/2018 was unremarkable.  Per notes, it appears that patient had PFT performed in December 2020 which I was not able to find any record of. At home, patient taking Flonase nasal spray  Insomnia At home, patient taking Ativan 0.5 mg.  Patient reports that she has had trouble sleeping over the last 3 nights because she has felt restless.  Melatonin as needed  Prednisone-induced osteoporosis Alendronate at home--currently supposed to be on a 2-year holiday given longstanding use  Mood disorder  Continue home Cymbalta 60 mg  Hypertension  Aortic regurgitation Only mild evidence on TTE with no evidence of structural changes. No tx per cards.  Patient reports normal readings at home.  Currently not on any antihypertensives at home.  Cardiologist has previously recommended Norvasc.  Continue to monitor  History of CVA In 2008 . On xarelto, no antiplatelets    #FEN/GI:  . Fluids: s/p 0.5 L  . Replete PRN  . Heart Healthy Diet   Access: Port in Right Chest, PIV on left arm  VTE prophylaxis: No VTE - full dose anticoagulation   Disposition: Admit to 5W med surg .  ============================================================================= HPI Andrea Berry is a 54 y.o. female with past medical history significant for legal blindness, lupus, chronic interstitial lung disease, history of pulmonary embolism, chronic central neuropathic pain, mild  cognitive impairment, mood disorder, neuromyelitis optica, steroid-induced osteopenia, migraine without aura, who presents with worsening shortness of breath in the setting of COVID-19. Patient reports that she has had very poor appetite over the last 3 days and has been unable to sleep as she has felt so restless.  She does not use oxygen at baseline at home in the setting of her chronic lung disease.  Patient reports that she did receive her Covid vaccinations about a month ago, but unfortunately did test positive for Covid 2 days ago.  She has several Covid contacts.  Per recent notes, there is concern for recent syncopal episode.  Patient reports that she did not have a syncopal episode.  She reports that she was walking back from her bathroom and went to sit on her bed but missed her bed.  Her son also reports that she did not lose consciousness nor hit her head.  He reports that she does get weak her legs from time to time and this has been chronic.    In the ED, all of the Covid labs are elevated.  She presents with fever of 101.1, tachycardia, tachypnea, normotensive and satting in low 90s on 2 L nasal cannula.  She received half liter bolus, remdesivir, Decadron.  Abnormal Labs Reviewed  SARS  CORONAVIRUS 2 BY RT PCR (HOSPITAL ORDER, [facility]) - Abnormal; Notable for the following components:      Result Value   SARS Coronavirus 2 POSITIVE (*)    All other components within normal limits  COMPREHENSIVE METABOLIC PANEL - Abnormal; Notable for the following components:   Sodium 132 (*)    CO2 20 (*)    Glucose, Bld 101 (*)    Creatinine, Ser 1.07 (*)    Calcium 8.6 (*)    AST 51 (*)    All other components within normal limits  CBC - Abnormal; Notable for the following components:   RBC 5.25 (*)    Hemoglobin 11.5 (*)    MCV 72.0 (*)    MCH 21.9 (*)    All other components within normal limits  D-DIMER, QUANTITATIVE (NOT AT [facility]) - Abnormal; Notable for  the following components:   D-Dimer, Quant 0.58 (*)    All other components within normal limits  LACTATE DEHYDROGENASE - Abnormal; Notable for the following components:   LDH 275 (*)    All other components within normal limits  FIBRINOGEN - Abnormal; Notable for the following components:   Fibrinogen 740 (*)    All other components within normal limits  C-REACTIVE PROTEIN - Abnormal; Notable for the following components:   CRP 4.3 (*)    All other components within normal limits  TRIGLYCERIDES - Abnormal; Notable for the following components:   Triglycerides 201 (*)    All other components within normal limits    Review Of Systems: Review of Systems Patient Active Problem List   Diagnosis Date Noted  . Interstitial lung disease ([place]) 01/18/2020  . 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 Elevated BP without diagnosis of hypertension 08/28/2019  . Constipation 08/04/2019  . Migraine without aura 03/27/2019  . Steroid-induced osteopenia 03/23/2019  . Encounter for screening for diseases of the blood and blood-forming organs and certain disorders involving the immune mechanism 01/07/2019  . Chronic rhinitis 01/07/2019  . Current chronic use of systemic steroids 01/07/2019  . History of pulmonary embolism 12/10/2018  . SLE (systemic lupus erythematosus related syndrome) ([place]) 11/07/2018  . Pigmented skin lesion of uncertain nature [id number]  . Mild cognitive impairment 10/08/2018  . Personal history of immunosupression therapy 10/08/2018  . Long-term corticosteroid use 10/08/2018  . Chronic central neuropathic pain 10/02/2018  . Lupus anticoagulant disorder ([place]) 10/02/2018  . Anxiety 07/12/2018  . Chronic pain syndrome 07/12/2018  . Chronic coughing 09/10/2016  . 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[place]) 01/03/2016  . Ataxia 01/03/2016  . Dysesthesia 01/03/2016  . Anemia of chronic disease 10/28/2015  . Mood disorder ([place]) 09/21/2015  . Legal blindness 01/30/2014   Past  Medical History: Past Medical History:  Diagnosis Date  . Anemia of chronic disease 10/28/2015  . Anxiety 07/12/2018  . Arthritis   . Ataxia 01/03/2016  . Blind   . Chronic central neuropathic pain 10/02/2018   [facility]/2017  . Chronic coughing 09/10/2016  . Chronic pain syndrome 07/12/2018  . Decreased strength 10/08/2018   Proximal leg muscles  . Depression   . Depression with anxiety 01/03/2016  . Devic's disease ([place])   . Dysesthesia 01/03/2016  . Dyspnea on exertion 11/07/2018  . Eustachian tube dysfunction 11/18/2018   L>R. Followed by [facility].  . Eustachian tube dysfunction, bilateral 09/18/2018  . GERD (gastroesophageal reflux disease)   . Headache(784.0)   . History of chicken pox 10/08/2018  . History  of CVA (cerebrovascular accident)   . History of kidney stones   . Hypertension   . Hyponatremia 11/25/2015  . Impairment of balance 10/08/2018  . Legal blindness 01/30/2014  . Long-term corticosteroid use 10/08/2018  . Lupus ([place])   . Lupus anticoagulant disorder ([place]) 10/02/2018   Pulmonary Embolism 2017  . Lupus cerebritis ([place]) 11/25/2015   Possible explanation of 11/25/2015 acute hyperintensity findings on Brain MRI.   Marland Kitchen 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[place]) 01/03/2016  . Orthostatic dizziness, Recurrent 10/08/2018   Associated with medications  . Otalgia 07/12/2018  . Personal history of immunosupression therapy 10/08/2018   Chronic Azathioprine and prednisone therapy for Devic's disease  . Pigmented skin lesion of uncertain nature [id number]  . Shortness of breath    due to medication  . Skin lesion of chest wall 11/07/2018  . SLE (systemic lupus erythematosus related syndrome) ([place]) 11/07/2018  . Smith antibody positive 04/07/2019  . Stroke [facility]) 2008   visually impaired  . Vitamin D deficiency 10/06/2018    Past Surgical History: Past Surgical History:  Procedure Laterality Date  . BREAST BIOPSY    . BREAST CYST EXCISION Left pt unsure  . COLONOSCOPY  WITH PROPOFOL N/A 07/26/2017   Procedure: COLONOSCOPY WITH PROPOFOL;  Surgeon: Carol Ada, MD;  Location: [facility] ENDOSCOPY;  Service: Endoscopy;  Laterality: N/A;  . ESOPHAGOGASTRODUODENOSCOPY (EGD) WITH PROPOFOL N/A 07/26/2017   Procedure: ESOPHAGOGASTRODUODENOSCOPY (EGD) WITH PROPOFOL;  Surgeon: Carol Ada, MD;  Location: [facility] ENDOSCOPY;  Service: Endoscopy;  Laterality: N/A;  . PORTA CATH INSERTION    . TUBAL LIGATION    . TUBAL LIGATION      Family History: family history includes Cancer in her father and mother; Lupus in her sister.   Social History: Social History   Social History Narrative   Patient lives with son [name] [phone]) as of 10/2018   Support from family   Frances [place] the following services: Food benefits, Social Security [place], [place] and [place] with  [employer] (10/2018).    Involved in her church and recives support from her church   Insufficient physical activity    Andriette reports that she has never smoked. She has never used smokeless tobacco. She reports that she does not drink alcohol or use drugs.  Allergies and Medications: No Known Allergies No outpatient medications have been marked as taking for the 02/04/20 encounter [facility] Encounter).    Objective: BP (!) 141/80   Pulse (!) 111   Temp (!) 101.1 F (38.4 C) (Oral)   Resp (!) 23   Ht 5\' 1"  (1.549 m)   Wt 88.9 kg   SpO2 99%   BMI 37.03 kg/m  Filed Weights   02/04/20 1059  Weight: 88.9 kg   Patient Vitals for the past 24 hrs:  BP Temp Temp src Pulse Resp SpO2 Height Weight  02/04/20 1745 (!) 141/80 -- -- -- -- -- -- --  02/04/20 1730 140/73 -- -- -- -- -- -- --  02/04/20 1700 131/70 -- -- -- (!) 23 -- -- --  02/04/20 1656 133/69 -- -- -- 19 -- -- --  02/04/20 1649 132/68 -- -- -- (!) 29 -- -- --  02/04/20 1615 -- -- -- (!) 111 (!) 29 99 % -- --  02/04/20 1548 -- -- -- 92 (!) 21 (!) 81 % -- --  02/04/20 1512 -- -- -- (!) 105 (!) 23 100 % -- --   02/04/20 1431 -- -- -- (!) 103 -- 95 % -- --  02/04/20 1059 (!) 112/58 (!) 101.1 F (38.4 C) Oral (!) 103 18 95 % 5\' 1"  (1.549 m) 88.9 kg   Exam: Physical Exam Constitutional:      Appearance: Normal appearance.  HENT:     Head: Normocephalic and atraumatic.     Mouth/Throat:     Mouth: Mucous membranes are dry.     Pharynx: No oropharyngeal exudate or posterior oropharyngeal erythema.  Eyes:     General:        Right eye: Discharge present.        Left eye: Discharge present.    Comments: Legally blind. Was able to sense light, but unable to see if there was a table next to her bed.   Cardiovascular:     Rate and Rhythm: Normal rate and regular rhythm.     Heart sounds: No murmur. No gallop.   Pulmonary:     Effort: Pulmonary effort is normal. No respiratory distress.     Breath sounds: Rales present.     Comments: Productive cough  Chest:     Chest wall: No tenderness.  Abdominal:     General: Bowel sounds are normal.     Palpations: Abdomen is soft.  Musculoskeletal:        General: No tenderness or deformity.     Cervical back: Normal range of motion and neck supple.     Right lower leg: No edema.     Left lower leg: No edema.  Skin:    General: Skin is warm.     Capillary Refill: Capillary refill takes less than 2 seconds.  Neurological:     General: No focal deficit present.     Mental Status: She is alert.     Sensory: Sensory deficit present.     Motor: Weakness present.     Comments: Chronic left sided weakness. Did not assess gait. Chronic sensory deficits in distal extremities.   Psychiatric:        Mood and Affect: Mood normal.        Behavior: Behavior normal.      Labs and Imaging: I have personally reviewed following labs and imaging studies CBC: Recent Labs  Lab 02/04/20 1106  WBC 4.0  HGB 11.5*  HCT 37.8  MCV 72.0*  PLT 221   CMP: Recent Labs  Lab 02/04/20 1106  NA 132*  K 3.7  CL 101  CO2 20*  GLUCOSE 101*  BUN 11  CREATININE  1.07*  CALCIUM 8.6*  ALBUMIN 3.6   GFR: Estimated Creatinine Clearance: 63 mL/min (A) (by C-G formula based on SCr of 1.07 mg/dL (H)).  Liver Function Tests: Recent Labs  Lab 02/04/20 1106  AST 51*  ALT 34  ALKPHOS 76  BILITOT 0.5  PROT 7.0  ALBUMIN 3.6   Recent Labs  Lab 02/04/20 1106  LIPASE 23    Coagulation Profile: No results for input(s): INR, PROTIME in the last 168 hours.  Cardiac Enzymes: No results for input(s): CKTOTAL, CKMB, CKMBINDEX, TROPONINI in the last 168 hours.  BNP (last 3 results) No results for input(s): PROBNP in the last 72 hours.  HbA1C: No results for input(s): HGBA1C in the last 72 hours.  CBG: No results for input(s): GLUCAP in the last 168 hours.  Lipid Profile: Recent Labs    02/04/20 1535  TRIG 201*    Thyroid Function Tests: No results for input(s): TSH, T4TOTAL, FREET4, T3FREE, THYROIDAB in the last 72 hours.  Anemia Panel: Recent Labs  02/04/20 1535  FERRITIN 151    Urine analysis: No results for input(s): COLORURINE, APPEARANCEUR, LABSPEC, PHURINE, GLUCOSEU, HGBUR, BILIRUBINUR, KETONESUR, PROTEINUR, UROBILINOGEN, NITRITE, LEUKOCYTESUR in the last 72 hours.  Sepsis Labs: Recent Labs    02/04/20 1535  PROCALCITON <0.10   No results for input(s): PHVEN, PCO2VEN, HCO3 in the last 72 hours. No results for input(s): TROPIPOC in the last 72 hours.  Recent Results (from the past 240 hour(s))  SARS Coronavirus 2 by RT PCR (hospital order, performed in [facility] hospital lab) Nasopharyngeal Nasopharyngeal Swab     Status: Abnormal   Collection Time: 02/04/20  1:56 PM   Specimen: Nasopharyngeal Swab  Result Value Ref Range Status   SARS Coronavirus 2 POSITIVE (A) NEGATIVE Final    Comment: RESULT CALLED TO, READ BACK BY AND VERIFIED WITH: Chalmers Cater RN 16:30 02/04/20 ([email or web address]) (NOTE) SARS-CoV-2 target nucleic acids are DETECTED SARS-CoV-2 RNA is generally detectable in upper respiratory specimens  during the  acute phase of infection.  Positive results are indicative  of the presence of the identified virus, but do not rule out bacterial infection or co-infection with other pathogens not detected by the test.  Clinical correlation with patient history and  other diagnostic information is necessary to determine patient infection status.  The expected result is negative. Fact Sheet for Patients:   StrictlyIdeas.no  Fact Sheet for Healthcare Providers:   BankingDealers.co.za   This test is not yet approved or cleared by the [place] FDA and  has been authorized for detection and/or diagnosis of SARS-CoV-2 by FDA under an Emergency Use Authorization (EUA).  This EUA will remain in effect (meaning this test can  be used) for the duration of  the COVID-19 declaration under Section 564(b)(1) of the Act, 21 U.S.C. section 360-bbb-3(b)(1), unless the authorization is terminated or revoked sooner. Performed at [facility], [place] [address], [place], [place] [address]      Imaging/Diagnostic Tests: CT Head Wo Contrast  Result Date: 02/04/2020 CLINICAL DATA:  Acute pain due to trauma. EXAM: CT HEAD WITHOUT CONTRAST TECHNIQUE: Contiguous axial images were obtained from the base of the skull through the vertex without intravenous contrast. COMPARISON:  MRI dated November 25, 2015. CT head dated September 21, 2015. FINDINGS: Brain: No evidence of acute infarction, hemorrhage, hydrocephalus, extra-axial collection or mass lesion/mass effect. Vascular: No hyperdense vessel or unexpected calcification. Skull: Normal. Negative for fracture or focal lesion. Sinuses/Orbits: There is pansinus mucosal thickening. The mastoid air cells are essentially clear. Other: None. IMPRESSION: 1. No acute intracranial abnormality. 2. Pansinus mucosal thickening. Electronically Signed   By: Constance Holster M.D.   On: 02/04/2020 17:52   DG Chest Port 1 View  Result Date:  02/04/2020 CLINICAL DATA:  COVID-19.  Cough.  Shortness of breath. EXAM: PORTABLE CHEST 1 VIEW COMPARISON:  11/06/2018 FINDINGS: The heart size and pulmonary vascularity are normal. Slight haziness at the left lung base laterally. Chronic scarring at the right lung base. No effusions. No bone abnormality of significance. IMPRESSION: Slight haziness at the left lung base laterally which could represent a small area of infiltrate. Scarring at the right lung base. Electronically Signed   By: Lorriane Shire M.D.   On: 02/04/2020 14:48    EKG Interpretation  Date/Time:  Thursday Feb 04 2020 15:07:22 EDT Ventricular Rate:  106 PR Interval:    QRS Duration: 80 QT Interval:  329 QTC Calculation: 437 R Axis:   63 Text Interpretation: Sinus tachycardia Probable left atrial enlargement  Probable LVH with secondary repol abnrm Abnormal T, probable ischemia, lateral leads Confirmed by Virgel Manifold [phone]) on 02/04/2020 3:10:38 PM          Wilber Oliphant, M.D. 02/04/2020, 6:20 PM PGY-2, [facility] pager: [phone], text pages welcome

## 2020-02-04 NOTE — Telephone Encounter (Signed)
April just called back to let Dr know EMS took Andrea Berry to the hospital a few hours ago. Thanks

## 2020-02-04 NOTE — ED Provider Notes (Signed)
[facility] EMERGENCY DEPARTMENT Provider Note   CSN: [id number] Arrival date & time: 02/04/20  1049     History Chief Complaint  Patient presents with  . Nausea    COVID +  . Fatigue    Andrea Berry is a 54 y.o. female.  Andrea Berry is a 54 y.o. female with complex past medical history including lupus, Devic's disease, hypertension, CVA, PE, interstitial lung disease, who presents to the emergency department for evaluation of nausea and fatigue.  Tested positive for Covid 2 days ago.  States she has been feeling increasingly worse and last night think she may have fallen or passed out.  She does not remember what happened but remembers her son having to help her off of the floor.  Denies any injury from the fall but is on anticoagulation.  Patient reports she has had nausea with very little p.o. intake over the last few days, has not had any vomiting or diarrhea and denies abdominal pain.  Reports persistent productive cough with worsening shortness of breath, particularly with exertion.  Denies any associated chest pain.  No lower extremity swelling.  Febrile on arrival and denies taking anything for her fever prior to coming in.  Spoke to her PCPs office who recommended coming to the ED for further evaluation.        Past Medical History:  Diagnosis Date  . Anemia of chronic disease 10/28/2015  . Anxiety 07/12/2018  . Arthritis   . Ataxia 01/03/2016  . Blind   . Chronic central neuropathic pain 10/02/2018   [facility]/2017  . Chronic coughing 09/10/2016  . Chronic pain syndrome 07/12/2018  . Decreased strength 10/08/2018   Proximal leg muscles  . Depression   . Depression with anxiety 01/03/2016  . Devic's disease ([place])   . Dysesthesia 01/03/2016  . Dyspnea on exertion 11/07/2018  . Eustachian tube dysfunction 11/18/2018   L>R. Followed by [facility]NT.  . Eustachian tube dysfunction, bilateral 09/18/2018  . GERD (gastroesophageal reflux  disease)   . Headache(784.0)   . History of chicken pox 10/08/2018  . History of CVA (cerebrovascular accident)   . History of kidney stones   . Hypertension   . Hyponatremia 11/25/2015  . Impairment of balance 10/08/2018  . Legal blindness 01/30/2014  . Long-term corticosteroid use 10/08/2018  . Lupus ([place])   . Lupus anticoagulant disorder ([place]) 10/02/2018   Pulmonary Embolism 2017  . Lupus cerebritis ([place]) 11/25/2015   Possible explanation of 11/25/2015 acute hyperintensity findings on Brain MRI.   Marland Kitchen 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[place]) 01/03/2016  . Orthostatic dizziness, Recurrent 10/08/2018   Associated with medications  . Otalgia 07/12/2018  . Personal history of immunosupression therapy 10/08/2018   Chronic Azathioprine and prednisone therapy for Devic's disease  . Pigmented skin lesion of uncertain nature [id number]  . Shortness of breath    due to medication  . Skin lesion of chest wall 11/07/2018  . SLE (systemic lupus erythematosus related syndrome) ([place]) 11/07/2018  . Smith antibody positive 04/07/2019  . Stroke [facility]) 2008   visually impaired  . Vitamin D deficiency 10/06/2018    Patient Active Problem List   Diagnosis Date Noted  . Interstitial lung disease ([place]) 01/18/2020  . 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 Elevated BP without diagnosis of hypertension 08/28/2019  . Constipation 08/04/2019  . Migraine without aura 03/27/2019  . Steroid-induced osteopenia 03/23/2019  . Encounter for screening for diseases of the blood and blood-forming  organs and certain disorders involving the immune mechanism 01/07/2019  . Chronic rhinitis 01/07/2019  . Current chronic use of systemic steroids 01/07/2019  . History of pulmonary embolism 12/10/2018  . SLE (systemic lupus erythematosus related syndrome) ([place]) 11/07/2018  . Pigmented skin lesion of uncertain nature [id number]  . Mild cognitive impairment 10/08/2018  . Personal history of immunosupression therapy 10/08/2018  .  Long-term corticosteroid use 10/08/2018  . Chronic central neuropathic pain 10/02/2018  . Lupus anticoagulant disorder ([place]) 10/02/2018  . Anxiety 07/12/2018  . Chronic pain syndrome 07/12/2018  . Chronic coughing 09/10/2016  . 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 Ataxia 01/03/2016  . Dysesthesia 01/03/2016  . Anemia of chronic disease 10/28/2015  . Mood disorder ([place]) 09/21/2015  . Legal blindness 01/30/2014    Past Surgical History:  Procedure Laterality Date  . BREAST BIOPSY    . BREAST CYST EXCISION Left pt unsure  . COLONOSCOPY WITH PROPOFOL N/A 07/26/2017   Procedure: COLONOSCOPY WITH PROPOFOL;  Surgeon: Carol Ada, MD;  Location: [facility] ENDOSCOPY;  Service: Endoscopy;  Laterality: N/A;  . ESOPHAGOGASTRODUODENOSCOPY (EGD) WITH PROPOFOL N/A 07/26/2017   Procedure: ESOPHAGOGASTRODUODENOSCOPY (EGD) WITH PROPOFOL;  Surgeon: Carol Ada, MD;  Location: [facility] ENDOSCOPY;  Service: Endoscopy;  Laterality: N/A;  . PORTA CATH INSERTION    . TUBAL LIGATION    . TUBAL LIGATION       OB History    Gravida  3   Para  3   Term  3   Preterm      AB      Living  3     SAB      TAB      Ectopic      Multiple      Live Births  3           Family History  Problem Relation Age of Onset  . Cancer Mother   . Cancer Father   . Lupus Sister     Social History   Tobacco Use  . Smoking status: Never Smoker  . Smokeless tobacco: Never Used  Substance Use Topics  . Alcohol use: No  . Drug use: No    Home Medications Prior to Admission medications   Medication Sig Start Date End Date Taking? Authorizing Provider  alendronate (FOSAMAX) 70 MG tablet TAKE 1 TABLET BY MOUTH IN THE MORNING EVERY 7 DAYS 01/14/20   McDiarmid, Blane Ohara, MD  azelastine (ASTELIN) 0.1 % nasal spray Place 2 sprays into both nostrils 2 (two) times daily as needed for rhinitis. 09/16/19   Garnet Sierras, DO  DULoxetine (CYMBALTA) 60 MG capsule TAKE 1 CAPSULE BY MOUTH EVERY DAY 10/30/19    McDiarmid, Blane Ohara, MD  esomeprazole (NEXIUM) 40 MG capsule TAKE 1 CAPSULE BY MOUTH TWICE A DAY 12/08/18   Minette Brine, FNP  fluticasone (FLONASE) 50 MCG/ACT nasal spray Place into the nose. 09/18/18   [provider]  ipratropium (ATROVENT) 0.03 % nasal spray PLACE 2 SPRAYS INTO BOTH NOSTRILS EVERY 12 (TWELVE) HOURS. 11/20/19   McDiarmid, Blane Ohara, MD  LINZESS 145 MCG CAPS capsule TAKE ONE CAPSULE EVERY DAY ON EMPTY STOMACH AT LEAST 30MIN BEFORE 1ST MEAL OF THE DAY 01/26/20   McDiarmid, Blane Ohara, MD  LORazepam (ATIVAN) 0.5 MG tablet TAKE 1 TABLET (0.5 MG TOTAL) BY MOUTH 2 (TWO) TIMES DAILY AS NEEDED FOR ANXIETY. 12/09/19   McDiarmid, Blane Ohara, MD  OXcarbazepine (TRILEPTAL) 150 MG tablet Take 150 mg by mouth 3 (three) times daily.  [provider]  predniSONE (DELTASONE) 5 MG tablet Take 1 tablet (5 mg total) by mouth daily. 08/28/19   McDiarmid, Blane Ohara, MD  pregabalin (LYRICA) 200 MG capsule TAKE 1 CAPSULE BY MOUTH THREE TIMES A DAY 07/11/18   Minette Brine, FNP  RiTUXimab (RITUXAN IV) Inject 1,000 mg into the vein.    [provider]  rivaroxaban (XARELTO) 10 MG TABS tablet Take 1 tablet (10 mg total) by mouth daily. With a meal 01/14/20   McDiarmid, Blane Ohara, MD  traMADol (ULTRAM) 50 MG tablet TAKE 1 TABLET (50 MG TOTAL) BY MOUTH EVERY 6 (SIX) HOURS AS NEEDED. 11/02/19   McDiarmid, Blane Ohara, MD  rivaroxaban (XARELTO) 20 MG TABS tablet Take 1 tablet (20 mg total) by mouth daily. 12/10/18   McDiarmid, Blane Ohara, MD    Allergies    Patient has no known allergies.  Review of Systems   Review of Systems  Constitutional: Positive for appetite change, chills and fever.  HENT: Negative.   Respiratory: Positive for cough and shortness of breath.   Cardiovascular: Negative for chest pain.  Gastrointestinal: Positive for nausea. Negative for abdominal pain and vomiting.  Genitourinary: Negative for dysuria and frequency.  Musculoskeletal: Positive for arthralgias and myalgias.  Skin: Negative  for color change and rash.  Neurological: Positive for syncope, weakness (Generalized) and light-headedness. Negative for headaches.  All other systems reviewed and are negative.   Physical Exam Updated Vital Signs BP (!) 112/58   Pulse (!) 103   Temp (!) 101.1 F (38.4 C) (Oral)   Resp 18   Ht 5\' 1"  (1.549 m)   Wt 88.9 kg   SpO2 95%   BMI 37.03 kg/m   Physical Exam Vitals and nursing note reviewed.  Constitutional:      General: She is not in acute distress.    Appearance: Normal appearance. She is well-developed. She is ill-appearing. She is not diaphoretic.  HENT:     Head: Normocephalic and atraumatic.     Mouth/Throat:     Mouth: Mucous membranes are dry.     Comments: Mucous membranes slightly dry Eyes:     General:        Right eye: No discharge.        Left eye: No discharge.  Cardiovascular:     Rate and Rhythm: Regular rhythm. Tachycardia present.     Heart sounds: Normal heart sounds. No murmur. No friction rub. No gallop.      Comments: Tachycardia with regular rhythm Pulmonary:     Effort: No respiratory distress.     Breath sounds: No wheezing or rales.     Comments: Patient mildly tachypneic, able to speak in full sentences, satting in the low 90s on room air, decreased breath sounds noted bilaterally on exam with some faint crackles Abdominal:     General: Bowel sounds are normal. There is no distension.     Palpations: Abdomen is soft. There is no mass.     Tenderness: There is no abdominal tenderness. There is no guarding.     Comments: Abdomen soft, nondistended, nontender to palpation in all quadrants without guarding or peritoneal signs  Musculoskeletal:        General: No deformity.     Cervical back: Neck supple.     Right lower leg: No edema.     Left lower leg: No edema.  Skin:    General: Skin is warm and dry.     Capillary Refill: Capillary refill takes less  than 2 seconds.  Neurological:     Mental Status: She is alert.      Coordination: Coordination normal.     Comments: Speech is clear, able to follow commands Moves extremities without ataxia, coordination intact  Psychiatric:        Mood and Affect: Mood normal.        Behavior: Behavior normal.     ED Results / Procedures / Treatments   Labs (all labs ordered are listed, but only abnormal results are displayed) Labs Reviewed  SARS CORONAVIRUS 2 BY RT PCR (HOSPITAL ORDER, [facility]) - Abnormal; Notable for the following components:      Result Value   SARS Coronavirus 2 POSITIVE (*)    All other components within normal limits  COMPREHENSIVE METABOLIC PANEL - Abnormal; Notable for the following components:   Sodium 132 (*)    CO2 20 (*)    Glucose, Bld 101 (*)    Creatinine, Ser 1.07 (*)    Calcium 8.6 (*)    AST 51 (*)    All other components within normal limits  CBC - Abnormal; Notable for the following components:   RBC 5.25 (*)    Hemoglobin 11.5 (*)    MCV 72.0 (*)    MCH 21.9 (*)    All other components within normal limits  D-DIMER, QUANTITATIVE (NOT AT [facility]) - Abnormal; Notable for the following components:   D-Dimer, Quant 0.58 (*)    All other components within normal limits  LACTATE DEHYDROGENASE - Abnormal; Notable for the following components:   LDH 275 (*)    All other components within normal limits  FIBRINOGEN - Abnormal; Notable for the following components:   Fibrinogen 740 (*)    All other components within normal limits  C-REACTIVE PROTEIN - Abnormal; Notable for the following components:   CRP 4.3 (*)    All other components within normal limits  TRIGLYCERIDES - Abnormal; Notable for the following components:   Triglycerides 201 (*)    All other components within normal limits  CBC - Abnormal; Notable for the following components:   Hemoglobin 10.8 (*)    MCV 73.2 (*)    MCH 21.8 (*)    MCHC 29.8 (*)    All other components within normal limits  CULTURE, BLOOD (ROUTINE X 2)    CULTURE, BLOOD (ROUTINE X 2)  LIPASE, BLOOD  LACTIC ACID, PLASMA  PROCALCITONIN  FERRITIN  DIFFERENTIAL  URINALYSIS, ROUTINE W REFLEX MICROSCOPIC  LACTIC ACID, PLASMA  I-STAT BETA HCG BLOOD, ED ([facility], [facility], [facility] ONLY)    EKG EKG Interpretation  Date/Time:  Thursday Feb 04 2020 15:07:22 EDT Ventricular Rate:  106 PR Interval:    QRS Duration: 80 QT Interval:  329 QTC Calculation: 437 R Axis:   63 Text Interpretation: Sinus tachycardia Probable left atrial enlargement Probable LVH with secondary repol abnrm Abnormal T, probable ischemia, lateral leads Confirmed by Virgel Manifold [phone]) on 02/04/2020 3:10:38 PM   Radiology CT Head Wo Contrast  Result Date: 02/04/2020 CLINICAL DATA:  Acute pain due to trauma. EXAM: CT HEAD WITHOUT CONTRAST TECHNIQUE: Contiguous axial images were obtained from the base of the skull through the vertex without intravenous contrast. COMPARISON:  MRI dated November 25, 2015. CT head dated September 21, 2015. FINDINGS: Brain: No evidence of acute infarction, hemorrhage, hydrocephalus, extra-axial collection or mass lesion/mass effect. Vascular: No hyperdense vessel or unexpected calcification. Skull: Normal. Negative for fracture or focal lesion. Sinuses/Orbits: There is pansinus  mucosal thickening. The mastoid air cells are essentially clear. Other: None. IMPRESSION: 1. No acute intracranial abnormality. 2. Pansinus mucosal thickening. Electronically Signed   By: Constance Holster M.D.   On: 02/04/2020 17:52   DG Chest Port 1 View  Result Date: 02/04/2020 CLINICAL DATA:  COVID-19.  Cough.  Shortness of breath. EXAM: PORTABLE CHEST 1 VIEW COMPARISON:  11/06/2018 FINDINGS: The heart size and pulmonary vascularity are normal. Slight haziness at the left lung base laterally. Chronic scarring at the right lung base. No effusions. No bone abnormality of significance. IMPRESSION: Slight haziness at the left lung base laterally which could represent a small area of infiltrate.  Scarring at the right lung base. Electronically Signed   By: Lorriane Shire M.D.   On: 02/04/2020 14:48    Procedures .Critical Care Performed by: Jacqlyn Larsen, PA-C Authorized by: Jacqlyn Larsen, PA-C   Critical care provider statement:    Critical care time (minutes):  45   Critical care was necessary to treat or prevent imminent or life-threatening deterioration of the following conditions:  Respiratory failure (COVID w/ acute respiratory failure)   Critical care was time spent personally by me on the following activities:  Discussions with consultants, evaluation of patient's response to treatment, examination of patient, ordering and performing treatments and interventions, ordering and review of laboratory studies, ordering and review of radiographic studies, pulse oximetry, re-evaluation of patient's condition, obtaining history from patient or surrogate and review of old charts   (including critical care time)  Medications Ordered in ED Medications  remdesivir 200 mg in sodium chloride 0.9% 250 mL IVPB (0 mg Intravenous Stopped 02/04/20 1746)    Followed by  remdesivir 100 mg in sodium chloride 0.9 % 100 mL IVPB (has no administration in time range)  sodium chloride 0.9 % bolus 500 mL (0 mLs Intravenous Stopped 02/04/20 1746)  ondansetron (ZOFRAN) injection 4 mg (4 mg Intravenous Given 02/04/20 1643)  dexamethasone (DECADRON) injection 6 mg (6 mg Intravenous Given 02/04/20 1644)    ED Course  I have reviewed the triage vital signs and the nursing notes.  Pertinent labs & imaging results that were available during my care of the patient were reviewed by me and considered in my medical decision making (see chart for details).    MDM Rules/Calculators/A&P                     54 year old female who was diagnosed Covid +2 days ago presents to the ED due to worsening nausea, fatigue and shortness of breath.  Think she fell last night and her son had to help her up from the ground.   Has not been eating or drinking much and reports worsening shortness of breath and cough.  Febrile to 101.1 on arrival and tachycardic vitals otherwise stable.  With ambulation sats dropped into the 80s, patient placed on 2 L nasal cannula.  Patient is ill-appearing, with some decreased air movement on lung exam and frequent cough.  History of chronic lung disease making her high risk for complications from Covid.  Given new oxygen requirement will get basic labs, Covid inflammatory markers, chest x-ray and given potential fall last night we will also check head CT.  Patient is on blood thinners.  No other evidence of trauma from fall.  Will start patient on small IV fluid bolus, Decadron and remdesivir.  I have independently ordered, reviewed and interpreted all labs and imaging: Covid: Positive CBC: No leukocytosis, stable hemoglobin, neutrophil-to-lymphocyte ratio  of 3.5 Lactic acid: Not elevated Covid inflammatory markers: Almost all of patient's inflammatory markers are somewhat elevated.  She does have an elevated D-dimer I feel this is more likely related to Covid infection as she does not have associated pleuritic pain and is anticoagulated  CXR: Haziness in the left lung base suggestive of infiltrate Head CT: No acute intracranial abnormalities.  Patient presents with acute respiratory failure with new oxygen requirement due to Covid infection, she will require hospital admission for further treatment.  She is at high risk for decompensation given her chronic lung disease.  Case discussed with family medicine teaching service who will see and admit the patient.  Final Clinical Impression(s) / ED Diagnoses Final diagnoses:  Acute respiratory failure due to COVID-19 [facility])    Rx / DC Orders ED Discharge Orders    None       Janet Berlin 02/04/20 1956    Virgel Manifold, MD 02/05/20 1038

## 2020-02-04 NOTE — Telephone Encounter (Signed)
Can't get here to go to ED, she is fully vaccinated and she is COVID +... April caregiver, would like to see if Dr. McDiarmid could give her a call to see if he could convince her to go to ED. Caregivers number is [phone]. Thanks

## 2020-02-04 NOTE — Telephone Encounter (Signed)
Called April back and she states that patient is SOB, cough, congestion and runny nose.  Her appetite is down and patient was found on the floor by her son and she doesn't remember how she got there.  After speaking with Dr. Wendy Poet he advised April to have patient seek care at the [facility] emergency department.  Patient tested on Tuesday and received her positive results today.  Patient's sister was also exposed to Ms. Kirker and her son in the past week and is currently hospitalized due to Covid.  April will relay this information to patient and assist her in calling EMS to get to the hospital.  PCP felt this was the best option as patient is complaining of sob and the ems truck is equipped to deal with any emergency.  Jazmin Hartsell,CMA

## 2020-02-05 DIAGNOSIS — J45909 Unspecified asthma, uncomplicated: Secondary | ICD-10-CM | POA: Diagnosis present

## 2020-02-05 DIAGNOSIS — G360 Neuromyelitis optica [Devic]: Secondary | ICD-10-CM | POA: Diagnosis present

## 2020-02-05 DIAGNOSIS — G894 Chronic pain syndrome: Secondary | ICD-10-CM | POA: Diagnosis present

## 2020-02-05 DIAGNOSIS — Z86711 Personal history of pulmonary embolism: Secondary | ICD-10-CM | POA: Diagnosis not present

## 2020-02-05 DIAGNOSIS — Z79899 Other long term (current) drug therapy: Secondary | ICD-10-CM | POA: Diagnosis not present

## 2020-02-05 DIAGNOSIS — D638 Anemia in other chronic diseases classified elsewhere: Secondary | ICD-10-CM | POA: Diagnosis present

## 2020-02-05 DIAGNOSIS — J9600 Acute respiratory failure, unspecified whether with hypoxia or hypercapnia: Secondary | ICD-10-CM

## 2020-02-05 DIAGNOSIS — U071 COVID-19: Secondary | ICD-10-CM | POA: Diagnosis present

## 2020-02-05 DIAGNOSIS — Z7901 Long term (current) use of anticoagulants: Secondary | ICD-10-CM | POA: Diagnosis not present

## 2020-02-05 DIAGNOSIS — J9601 Acute respiratory failure with hypoxia: Secondary | ICD-10-CM | POA: Diagnosis present

## 2020-02-05 DIAGNOSIS — M329 Systemic lupus erythematosus, unspecified: Secondary | ICD-10-CM | POA: Diagnosis present

## 2020-02-05 DIAGNOSIS — J1282 Pneumonia due to coronavirus disease 2019: Secondary | ICD-10-CM | POA: Diagnosis present

## 2020-02-05 DIAGNOSIS — Z7952 Long term (current) use of systemic steroids: Secondary | ICD-10-CM | POA: Diagnosis not present

## 2020-02-05 DIAGNOSIS — T380X5A Adverse effect of glucocorticoids and synthetic analogues, initial encounter: Secondary | ICD-10-CM | POA: Diagnosis present

## 2020-02-05 DIAGNOSIS — Z8673 Personal history of transient ischemic attack (TIA), and cerebral infarction without residual deficits: Secondary | ICD-10-CM | POA: Diagnosis not present

## 2020-02-05 DIAGNOSIS — M818 Other osteoporosis without current pathological fracture: Secondary | ICD-10-CM | POA: Diagnosis present

## 2020-02-05 DIAGNOSIS — G4700 Insomnia, unspecified: Secondary | ICD-10-CM | POA: Diagnosis present

## 2020-02-05 DIAGNOSIS — F39 Unspecified mood [affective] disorder: Secondary | ICD-10-CM | POA: Diagnosis present

## 2020-02-05 DIAGNOSIS — R55 Syncope and collapse: Secondary | ICD-10-CM | POA: Diagnosis present

## 2020-02-05 DIAGNOSIS — H548 Legal blindness, as defined in USA: Secondary | ICD-10-CM | POA: Diagnosis present

## 2020-02-05 DIAGNOSIS — I10 Essential (primary) hypertension: Secondary | ICD-10-CM | POA: Diagnosis present

## 2020-02-05 LAB — URINALYSIS, ROUTINE W REFLEX MICROSCOPIC
Bacteria, UA: NONE SEEN
Bilirubin Urine: NEGATIVE
Glucose, UA: NEGATIVE mg/dL
Hgb urine dipstick: NEGATIVE
Ketones, ur: 20 mg/dL — AB
Leukocytes,Ua: NEGATIVE
Nitrite: NEGATIVE
Protein, ur: 30 mg/dL — AB
Specific Gravity, Urine: 1.016 (ref 1.005–1.030)
pH: 6 (ref 5.0–8.0)

## 2020-02-05 LAB — COMPREHENSIVE METABOLIC PANEL
ALT: 41 U/L (ref 0–44)
AST: 54 U/L — ABNORMAL HIGH (ref 15–41)
Albumin: 3.8 g/dL (ref 3.5–5.0)
Alkaline Phosphatase: 78 U/L (ref 38–126)
Anion gap: 12 (ref 5–15)
BUN: 10 mg/dL (ref 6–20)
CO2: 20 mmol/L — ABNORMAL LOW (ref 22–32)
Calcium: 9.3 mg/dL (ref 8.9–10.3)
Chloride: 104 mmol/L (ref 98–111)
Creatinine, Ser: 0.96 mg/dL (ref 0.44–1.00)
GFR calc Af Amer: >60 mL/min (ref >60)
GFR calc non Af Amer: >60 mL/min (ref >60)
Glucose, Bld: 93 mg/dL (ref 70–99)
Potassium: 4.3 mmol/L (ref 3.5–5.1)
Sodium: 136 mmol/L (ref 135–145)
Total Bilirubin: 0.7 mg/dL (ref 0.3–1.2)
Total Protein: 7.2 g/dL (ref 6.5–8.1)

## 2020-02-05 LAB — CBC
HCT: 38.9 % (ref 36.0–46.0)
Hemoglobin: 11.7 g/dL — ABNORMAL LOW (ref 12.0–15.0)
MCH: 21.5 pg — ABNORMAL LOW (ref 26.0–34.0)
MCHC: 30.1 g/dL (ref 30.0–36.0)
MCV: 71.6 fL — ABNORMAL LOW (ref 80.0–100.0)
Platelets: 253 10*3/uL (ref 150–400)
RBC: 5.43 MIL/uL — ABNORMAL HIGH (ref 3.87–5.11)
RDW: 15 % (ref 11.5–15.5)
WBC: 6.5 10*3/uL (ref 4.0–10.5)
nRBC: 0 % (ref 0.0–0.2)

## 2020-02-05 LAB — HIV ANTIBODY (ROUTINE TESTING W REFLEX): HIV Screen 4th Generation wRfx: NONREACTIVE

## 2020-02-05 LAB — FERRITIN: Ferritin: 202 ng/mL (ref 11–307)

## 2020-02-05 LAB — FIBRINOGEN: Fibrinogen: 737 mg/dL — ABNORMAL HIGH (ref 210–475)

## 2020-02-05 LAB — LACTATE DEHYDROGENASE: LDH: 337 U/L — ABNORMAL HIGH (ref 98–192)

## 2020-02-05 LAB — C-REACTIVE PROTEIN: CRP: 7.3 mg/dL — ABNORMAL HIGH (ref <1.0)

## 2020-02-05 MED ORDER — SODIUM CHLORIDE 0.9 % IV BOLUS
500.0000 mL | Freq: Once | INTRAVENOUS | Status: AC
  Administered 2020-02-06: 500 mL via INTRAVENOUS

## 2020-02-05 MED ORDER — SODIUM CHLORIDE 0.9 % IV BOLUS
500.0000 mL | Freq: Once | INTRAVENOUS | Status: AC
  Administered 2020-02-05: 500 mL via INTRAVENOUS

## 2020-02-05 MED ORDER — ONDANSETRON HCL 4 MG/2ML IJ SOLN
4.0000 mg | Freq: Three times a day (TID) | INTRAMUSCULAR | Status: DC
  Administered 2020-02-05 – 2020-02-09 (×12): 4 mg via INTRAVENOUS
  Filled 2020-02-05 (×12): qty 2

## 2020-02-05 MED ORDER — KETOTIFEN FUMARATE 0.025 % OP SOLN
1.0000 [drp] | Freq: Two times a day (BID) | OPHTHALMIC | Status: DC
  Administered 2020-02-05 – 2020-02-09 (×9): 1 [drp] via OPHTHALMIC
  Filled 2020-02-05: qty 5

## 2020-02-05 MED ORDER — DEXAMETHASONE SODIUM PHOSPHATE 10 MG/ML IJ SOLN
6.0000 mg | Freq: Every day | INTRAMUSCULAR | Status: DC
  Administered 2020-02-05 – 2020-02-09 (×5): 6 mg via INTRAVENOUS
  Filled 2020-02-05 (×5): qty 1

## 2020-02-05 MED ORDER — SODIUM CHLORIDE 0.9 % IV BOLUS
1000.0000 mL | Freq: Once | INTRAVENOUS | Status: AC
  Administered 2020-02-05: 1000 mL via INTRAVENOUS

## 2020-02-05 MED ORDER — SODIUM CHLORIDE 0.9 % IV SOLN: INTRAVENOUS | Status: DC

## 2020-02-05 NOTE — Evaluation (Signed)
Physical Therapy Evaluation Patient Details Name: Andrea Berry MRN: [id number] DOB: 02-19-1966 Today's Date: 02/05/2020   History of Present Illness  54 y.o. female admitted on 02/04/20 with acute hypoxic respiratory failure due to COVID 19.  Of note, she was fully vaccinated ~ 1 month PTA.  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    Clinical Impression  Pt fatigued, weak, and poor appetite.  She was able to get up and walk around the room, but needed 2 L O2 [place] to maintain sats out of the low 80s during mobility.  She was in the low 90s at rest.  She reports she lives with her son who works, but he could take days off if needed to assist her, she also has an aide for 2-3 hours per day 7 days per week.   PT to follow acutely for deficits listed below.      Follow Up Recommendations Home health PT    Equipment Recommendations  Other (comment)(home O2)    Recommendations for Other Services   NA    Precautions / Restrictions Precautions Precautions: Fall;Other (comment) Precaution Comments: monitor O2      Mobility  Bed Mobility Overal bed mobility: Modified Independent                Transfers Overall transfer level: Needs assistance Equipment used: Rolling walker (2 wheeled) Transfers: Sit to/from Stand Sit to Stand: Min guard         General transfer comment: Min guard assist for safety and balance, stabilization of RW and line management.   Ambulation/Gait Ambulation/Gait assistance: Min guard Gait Distance (Feet): 20 Feet Assistive device: Rolling walker (2 wheeled) Gait Pattern/deviations: Step-through pattern;Staggering right;Staggering left Gait velocity: decreased Gait velocity interpretation: <1.8 ft/sec, indicate of risk for recurrent falls General Gait Details: Pt with mildly staggering gait pattern, able to correct with RW support, however, needed most assist to  steer and navigate her way around obstacles in dark room with sunglasses donned (even the light from the IV pole screen was noxious).           Balance Overall balance assessment: Needs assistance Sitting-balance support: Feet supported;No upper extremity supported Sitting balance-Leahy Scale: Good     Standing balance support: Bilateral upper extremity supported Standing balance-Leahy Scale: Poor Standing balance comment: needs external support in standing.                              Pertinent Vitals/Pain Pain Assessment: Faces Faces Pain Scale: No hurt    Home Living Family/patient expects to be discharged to:: Private residence Living Arrangements: Children(son) Available Help at Discharge: Family;Personal care attendant(son can take a few days off work if needed) Type of Home: House Home Access: Level entry     Home Layout: One level Home Equipment: Environmental consultant - 2 wheels;Shower seat;Grab bars - tub/shower;Hand held shower head Additional Comments: reports she is active with services for the blind    Prior Function Level of Independence: Independent with assistive device(s);Needs assistance   Gait / Transfers Assistance Needed: walks with RW at baseline  ADL's / Homemaking Assistance Needed: does her own dressing and toileting, waits for the aide to bathe.  She helps her son cook, but he does most of the cleaning and housework.   Comments: Has an aide 2-3 hours per day 7 days per week.  Hand Dominance   Dominant Hand: Right    Extremity/Trunk Assessment   Upper Extremity Assessment Upper Extremity Assessment: Defer to OT evaluation    Lower Extremity Assessment Lower Extremity Assessment: Generalized weakness    Cervical / Trunk Assessment Cervical / Trunk Assessment: Normal  Communication   Communication: No difficulties  Cognition Arousal/Alertness: Awake/alert Behavior During Therapy: Flat affect Overall Cognitive Status: Within  Functional Limits for tasks assessed                                        General Comments General comments (skin integrity, edema, etc.): see separate O2 sat note, Pt needed 2 L O2 [place] to maintain sats during gait.  HR max 127 during short distance gait.         Assessment/Plan    PT Assessment Patient needs continued PT services  PT Problem List Decreased strength;Decreased activity tolerance;Decreased balance;Decreased mobility;Decreased knowledge of use of DME;Cardiopulmonary status limiting activity       PT Treatment Interventions DME instruction;Gait training;Functional mobility training;Therapeutic activities;Therapeutic exercise;Balance training;Patient/family education    PT Goals (Current goals can be found in the Care Plan section)  Acute Rehab PT Goals Patient Stated Goal: to get better and get back home PT Goal Formulation: With patient Time For Goal Achievement: 02/19/20 Potential to Achieve Goals: Good    Frequency Min 3X/week           AM-PAC PT "6 Clicks" Mobility  Outcome Measure Help needed turning from your back to your side while in a flat bed without using bedrails?: None Help needed moving from lying on your back to sitting on the side of a flat bed without using bedrails?: None Help needed moving to and from a bed to a chair (including a wheelchair)?: None Help needed standing up from a chair using your arms (e.g., wheelchair or bedside chair)?: A Little Help needed to walk in hospital room?: A Little Help needed climbing 3-5 steps with a railing? : A Little 6 Click Score: 21    End of Session Equipment Utilized During Treatment: Oxygen Activity Tolerance: Patient limited by fatigue Patient left: in bed;with call bell/phone within reach   PT Visit Diagnosis: Muscle weakness (generalized) (M62.81);Difficulty in walking, not elsewhere classified (R26.2)    Time: [id number] PT Time Calculation (min) (ACUTE ONLY): 27  min   Charges:         Verdene Lennert, PT, DPT  Acute Rehabilitation [phone] pager #[phone] office      PT Evaluation $PT Eval Moderate Complexity: 1 Mod PT Treatments $Gait Training: 8-22 mins        02/05/2020, 3:13 PM

## 2020-02-05 NOTE — Significant Event (Signed)
Rapid Response Event Note  Overview: MEWS - Fever/Tachycardia/Tachypnea  Initial Focused Assessment: Called to the bedside by nursing with concerns of patient having an elevated MEWS score. The primary nurse had already paged the MD on call and administered APAP. Upon arrival, patient was breathing comfortably, endorsed being nauseated - she mentioned that she has not able to eat/drink much, skin very hot to touch and dry, Lung sounds - coarse throughout - scattered rhonchi, abdomen - soft/nontender. Not in acute distress. Temperature now 101 (O) per nurse, HR 115, BP 102/50 (66), RR 22, placed on 2L [place] by nurse - saturations 94%  Interventions: -- NO RRT INTERVENTIONS  Plan of Care: -- Recheck all VS in an hour -- Monitor and trend VS  -- Wean oxygen as patient tolerates -- Should BP be soft - could consider small fluid bolus or small amount of MIVF since patient has not much PO intake.   Event Summary:  Call Time 1617 Arrival Time 1630 End Time 1640   Judas Mohammad R

## 2020-02-05 NOTE — Progress Notes (Signed)
SATURATION QUALIFICATIONS: (This note is used to comply with regulatory documentation for home oxygen)  Patient Saturations on Room Air at Rest = 90%  Patient Saturations on Room Air while Ambulating = 82%  Patient Saturations on 2 Liters of oxygen while Ambulating = 91%  Please briefly explain why patient needs home oxygen: Pt desaturates on RA during mobility (she is in the low 90s on RA at rest), but is able to return to the low 90s with 2 L O2 [place] during gait.  HR 127 max observed during short distance gait as well.   Verdene Lennert, PT, DPT  Acute Rehabilitation [phone] pager [phone] office

## 2020-02-05 NOTE — Progress Notes (Signed)
Dr. Chauncey Reading at bedside assessing pt. Bolus has been started for 1 L

## 2020-02-05 NOTE — Plan of Care (Signed)
  Problem: Respiratory: Goal: Complications related to the disease process, condition or treatment will be avoided or minimized Outcome: Not Progressing    Received call from tele that patients HR was sustained in 120's.  Went to assess patient.  Patient at rest, HR began sustaining in 130's.  states she feels fine but like her heart is racing a little.  Dr. Chauncey Reading notified and requested temp check.  Oral temp over 103.  Rapid Response called and came to bedside to assess patient as well.  Tylenol given (1608) + decadron ordered/given.  Temp and HR improved within the next 30 minutes.  20 minutes later, BP dropped to 70-80s/40-50s.  Dr. Chauncey Reading immediately notified and came to bedside.  1L bolus given (1650).  BP improved to 100s/50s.  HR improved to 90s.  Patient states she is feeling better at this time.  Endorsed to night shift RN.

## 2020-02-05 NOTE — Progress Notes (Signed)
   02/05/20 1600  Assess: MEWS Score  Temp (!) 103.7 F (39.8 C)  BP 127/70  Pulse Rate (!) 130  ECG Heart Rate (!) 130  Resp (!) 28  Level of Consciousness Alert  SpO2 90 %  Assess: MEWS Score  MEWS Temp 2  MEWS Systolic 0  MEWS Pulse 3  MEWS RR 2  MEWS LOC 0  MEWS Score 7  MEWS Score Color Red  Assess: if the MEWS score is Yellow or Red  Were vital signs taken at a resting state? Yes  Focused Assessment Documented focused assessment  Early Detection of Sepsis Score *See Row Information* Medium  MEWS guidelines implemented *See Row Information* Yes  Treat  MEWS Interventions Administered prn meds/treatments (tylenol given, MD aware )  Take Vital Signs  Increase Vital Sign Frequency  Red: Q 1hr X 4 then Q 4hr X 4, if remains red, continue Q 4hrs  Escalate  MEWS: Escalate Red: discuss with charge nurse/RN and provider, consider discussing with RRT  Notify: Charge Nurse/RN  Name of Charge Nurse/RN Notified Rasheen'  Date Charge Nurse/RN Notified 02/05/20  Time Charge Nurse/RN Notified [id number]  Notify: Provider  Provider Name/Title Dr. Gwendlyn Deutscher  Date Provider Notified 02/05/20  Time Provider Notified 1600  Notification Type Call  Notification Reason Change in status  Response No new orders  Date of Provider Response 02/05/20  Time of Provider Response 1600    MD and Charge Notified, Tylenol given for fever. No new orders. Vitals taken as followed.

## 2020-02-05 NOTE — Progress Notes (Signed)
FPTS Interim Progress Note  S: Patient recently febrile to 103, HR to 130s and after 650 mg tylenol improved to 100s, now BP dropping to 70s/40s.   O: BP (!) 92/43 (BP Location: Right Arm)   Pulse (!) 110   Temp (!) 103 F (39.4 C) (Oral)   Resp (!) 31   Ht 5\' 1"  (1.549 m)   Wt 88.3 kg   SpO2 92%   BMI 36.78 kg/m   Gen: ill-appearing blind AA woman in mild distress, AOx4 Card: tachycardic, regular rhythm, no m/r/g Pulm: coarse rhonchi heard diffusely, diminished air movement in RLL, no usage of accessory muscles, tachypneic  A/P: COVID-19 pneumonia - Start 1L NS bolus - BP improved to MAP 66 after ~4-500 cc - Tachycardia improved to 107 after 650mg  tylenol - Continue progressive care  Gladys Damme, MD 02/05/2020, 5:14 PM PGY-1, [place] [facility] Medicine Service pager [phone]

## 2020-02-05 NOTE — Progress Notes (Signed)
Dr. Posey Pronto paged about the pt being red/yellow today and continued soft blood pressures.  5100mL NS bolus ordered.  Asked to page back 1 hour following intervention.

## 2020-02-05 NOTE — Progress Notes (Signed)
Family Medicine Teaching Service Daily Progress Note Intern Pager: [phone]  Patient name: Andrea Berry Medical record number: [id number] Date of birth: [birth year] Age: 54 y.o. Gender: female  Primary Care Provider: McDiarmid, Blane Ohara, MD Consultants: none Code Status: Full  Pt Overview and Major Events to Date:  02/04/2020 admitted  Assessment and Plan: Andrea Berry is a 54 y.o. female who presented with shortness of breath, feeling unwell and was admitted for COVID-19. Past medical history significant for legal blindness, lupus, chronic interstitial lung disease, history of pulmonary embolism on xarelto, chronic central neuropathic pain, mild cognitive impairment, mood disorder, neuromyelitis optica, steroid-induced osteopenia, migraine without aura,  COVID 19 Patient appeared much better this morning, alert, resting comfortably in bed, satting >90% on RA, pulmonary exam clear, VSS, afebrile. However, this afternoon, patient became tachycardic to 130s and is febrile to 103, giving antipyretics now. Plan to watch patient until afebrile for >24h, and maintaining O2 requirement <4L Andrea Berry (currently on RA). Patient had two doses of COVID-19 vaccine, Pfizer, about a month ago. However, she is chronically immunosuppressed due to autoimmune diseases of SLE, Devic's disease: pt takes rituximab q6 mo and uses prednisone chronically. Suspect infection is likely due to this factor as well as patient lives with her children who were not vaccinated and were symptomatic with COVID-19. Will continue treating with decadron for 10d, remdesivir x5d, oxygen therapy as needed. Inflammatory markers are notably elevated consistent with COVID-19, electrolytes stable: LDH 337, CRP 7.3, Fibrinogen 737. AST mildly elevated to 54, which could be due to remdesivir, will monitor with CMP.  Continuous cardiac monitoring   Continuous pulse oximetry  Vitals per unit routine, O2 > 92%   Activity out of bed with assistance    Follow up blood cultures  AM Labs CMP, CBC  Continue remdesivir (5/31) and Decadron daily (6/5)  PT/OT eval and treat  Neuromyelitis optica Legally blind Currently follows with ophthalmology.  No recent acute changes in vision.  Continue home ophthalmic solution  SLE Followed by [facility]ology at [facility].  Currently not taking hydroxychloroquine per her ophthalmologist.  She is currently on prednisone 5 mg and obtains rituximab infusions every 6 months.  Patient reports not taking her medication over the last 3 days and this is confirmed by her son.  She receives 6 mg of Decadron in the ED. we will continue Decadron for Covid and hold her home prednisone at this time.  Continue Decadron  Hold home prednisone  History PE Occurred in 2017, currently on Xarelto 10 mg daily.  Patient has tested negative for antiphospholipid syndrome.  Continue anticoagulation dosing of Xarelto daily  Hx Transverse myelitis, Devic's Disease, 2018 Neuromyelitis optica Severe neuropathic pain Patient developed pain in 2017 and has been present since.  She has chronic left upper and lower extremity weakness.  She has tried several medications including tramadol, gabapentin and Lyrica. Patient was put on Trileptal in 2019 by her neurologist  Continue  Scheduled Trileptal, Lyrica,   Home tramadol as needed   Chronic interstitial lung disease Follows with pulmonology, asthma and allergy.  Per chart review, PFT on 11/24/2018 was unremarkable.  Per notes, it appears that patient had PFT performed in December 2020 which I was not able to find any record of. At home, patient taking Flonase nasal spray - Continue flonase  Insomnia At home, patient taking Ativan 0.5 mg.  Patient reports that she has had trouble sleeping over the last 3 nights because she has felt restless.  Melatonin as  needed  Prednisone-induced osteoporosis Alendronate at home--currently supposed to be on a 2-year holiday  given longstanding use  Mood disorder  Continue home Cymbalta 60 mg  Hypertension  Aortic regurgitation Only mild evidence on TTE with no evidence of structural changes. No tx per cards.  Patient reports normal readings at home.  Currently not on any antihypertensives at home.  Cardiologist has previously recommended Norvasc. BP today normotensive at 123/72.  Continue to monitor  History of CVA In 2008  On xarelto, no antiplatelets   FEN/GI: HH diet PPx: full dose anticoagulation  Disposition: to 5W med-surg pending medical stabilization  Subjective:  Patient appearing well early this morning, states she feels much better than yesterday.  Objective: Temp:  [98.9 F (37.2 C)-101.1 F (38.4 C)] 98.9 F (37.2 C) (05/28 0400) Pulse Rate:  [92-111] 97 (05/28 0500) Resp:  [16-30] 16 (05/28 0500) BP: (112-141)/(55-86) 122/71 (05/28 0400) SpO2:  [81 %-100 %] 98 % (05/28 0500) Weight:  [88.3 kg-88.9 kg] 88.3 kg (05/28 0500) Physical Exam: General: older, obese, blind AA woman resting comfortably in bed, NAD Cardiovascular: mildly tachycardic, regular rhythm, no m/r/g Respiratory: CTAB, no wheeze/rale/rhonchi, no increased WOB, on RA Abdomen: soft, NT, ND Extremities: warm, dry no edema  Laboratory: Recent Labs  Lab 02/04/20 1106 02/04/20 1731  WBC 4.0 4.3  HGB 11.5* 10.8*  HCT 37.8 36.3  PLT 221 203   Recent Labs  Lab 02/04/20 1106  NA 132*  K 3.7  CL 101  CO2 20*  BUN 11  CREATININE 1.07*  CALCIUM 8.6*  PROT 7.0  BILITOT 0.5  ALKPHOS 76  ALT 34  AST 51*  GLUCOSE 101*   Imaging/Diagnostic Tests: CT Head Wo Contrast  Result Date: 02/04/2020 CLINICAL DATA:  Acute pain due to trauma. EXAM: CT HEAD WITHOUT CONTRAST TECHNIQUE: Contiguous axial images were obtained from the base of the skull through the vertex without intravenous contrast. COMPARISON:  MRI dated November 25, 2015. CT head dated September 21, 2015. FINDINGS: Brain: No evidence of acute  infarction, hemorrhage, hydrocephalus, extra-axial collection or mass lesion/mass effect. Vascular: No hyperdense vessel or unexpected calcification. Skull: Normal. Negative for fracture or focal lesion. Sinuses/Orbits: There is pansinus mucosal thickening. The mastoid air cells are essentially clear. Other: None. IMPRESSION: 1. No acute intracranial abnormality. 2. Pansinus mucosal thickening. Electronically Signed   By: Constance Holster M.D.   On: 02/04/2020 17:52   DG Chest Port 1 View  Result Date: 02/04/2020 CLINICAL DATA:  COVID-19.  Cough.  Shortness of breath. EXAM: PORTABLE CHEST 1 VIEW COMPARISON:  11/06/2018 FINDINGS: The heart size and pulmonary vascularity are normal. Slight haziness at the left lung base laterally. Chronic scarring at the right lung base. No effusions. No bone abnormality of significance. IMPRESSION: Slight haziness at the left lung base laterally which could represent a small area of infiltrate. Scarring at the right lung base. Electronically Signed   By: Lorriane Shire M.D.   On: 02/04/2020 14:48   Gladys Damme, MD 02/05/2020, 6:42 AM PGY-1, [place] Intern pager: [phone], text pages welcome

## 2020-02-05 NOTE — Progress Notes (Signed)
Pt admitted to 5W for symptomatic COVID dx and is on cardiac monitoring. Pt is A&O x4 and has c/o light sensitivity, she is legally blind in both eyes. Pt is in no acute respiratory distress and is currently on [facility] with average 02 saturation in the mid 90s. VSS. Education provided on current plan of care, fall prevention policies, and use of call bell. Pt relayed understanding of these topics and agrees with POC. Will continue to monitor.

## 2020-02-05 NOTE — Progress Notes (Signed)
Dr. Posey Pronto called concerned about trending blood pressure.  She has ordered a second 581mL NS bolus, followed by maintenance fluids @ 125/mL.

## 2020-02-06 LAB — FIBRINOGEN: Fibrinogen: 608 mg/dL — ABNORMAL HIGH (ref 210–475)

## 2020-02-06 LAB — COMPREHENSIVE METABOLIC PANEL
ALT: 32 U/L (ref 0–44)
AST: 37 U/L (ref 15–41)
Albumin: 3.2 g/dL — ABNORMAL LOW (ref 3.5–5.0)
Alkaline Phosphatase: 63 U/L (ref 38–126)
Anion gap: 8 (ref 5–15)
BUN: 9 mg/dL (ref 6–20)
CO2: 20 mmol/L — ABNORMAL LOW (ref 22–32)
Calcium: 8.4 mg/dL — ABNORMAL LOW (ref 8.9–10.3)
Chloride: 111 mmol/L (ref 98–111)
Creatinine, Ser: 0.81 mg/dL (ref 0.44–1.00)
GFR calc Af Amer: >60 mL/min (ref >60)
GFR calc non Af Amer: >60 mL/min (ref >60)
Glucose, Bld: 157 mg/dL — ABNORMAL HIGH (ref 70–99)
Potassium: 4.9 mmol/L (ref 3.5–5.1)
Sodium: 139 mmol/L (ref 135–145)
Total Bilirubin: 0.6 mg/dL (ref 0.3–1.2)
Total Protein: 6.1 g/dL — ABNORMAL LOW (ref 6.5–8.1)

## 2020-02-06 LAB — CBC
HCT: 36.4 % (ref 36.0–46.0)
Hemoglobin: 10.6 g/dL — ABNORMAL LOW (ref 12.0–15.0)
MCH: 21.5 pg — ABNORMAL LOW (ref 26.0–34.0)
MCHC: 29.1 g/dL — ABNORMAL LOW (ref 30.0–36.0)
MCV: 74 fL — ABNORMAL LOW (ref 80.0–100.0)
Platelets: 256 10*3/uL (ref 150–400)
RBC: 4.92 MIL/uL (ref 3.87–5.11)
RDW: 15.4 % (ref 11.5–15.5)
WBC: 8.3 10*3/uL (ref 4.0–10.5)
nRBC: 0 % (ref 0.0–0.2)

## 2020-02-06 LAB — D-DIMER, QUANTITATIVE: D-Dimer, Quant: 0.54 ug/mL-FEU — ABNORMAL HIGH (ref 0.00–0.50)

## 2020-02-06 LAB — C-REACTIVE PROTEIN: CRP: 6.4 mg/dL — ABNORMAL HIGH (ref <1.0)

## 2020-02-06 NOTE — Progress Notes (Signed)
Pt slept for most of the night.  Pts MEWS has been consistently green past midnight.  Pts fever and hypotension improved with fluids/hydration.  Currently on 1L [place], saturating ~96%. No new complaints.

## 2020-02-06 NOTE — Progress Notes (Signed)
Family Medicine Teaching Service Daily Progress Note Intern Pager: [phone]  Patient name: Andrea Berry Medical record number: [id number] Date of birth: 1966/04/18 Age: 54 y.o. Gender: female  Primary Care Provider: McDiarmid, Blane Ohara, MD Consultants: IP CONSULT TO FAMILY PRACTICE Code Status: Full Code   Pt Overview and Major Events to Date:  Hospital Day: 3 02/04/2020: admitted for Nausea (COVID +) and Fatigue   5/27-6/1 Remdesevir   Assessment and Plan: Andrea Berry is a 54 y.o. female who presented w/ COVID-19 and admitted for worsening dyspnea.  PMHx s/f chronic interstitial lung disease, history of PE on Xarelto, central neuropathic pain, neuromyelitis optica, steroid-induced osteopenia, migraine without aura, legal blindness, lupus, mood disorder, MCI.  COVID-19 Overnight, patient afebrile with stable vital signs.  She is currently satting well on room air.  Lungs today with good air movement and continued crackles.  Patient is satting 100% at 2 L.  Decreased oxygen to 0.5 and will continue to monitor. D-dimer declining, fibrinogen declining.  Blood cultures negative x2 days -Continue remdesivir and Decadron -Supportive care  Anemia Hemoglobin 10.6 - continue xarelto   SLE  - holding home prednisone - continue decadron   HxTransverse myelitis, Devic's Disease, 2018 Neuromyelitis optica Severe neuropathic pain Continue home scheduled Trileptal, Lyrica Home tramadol as needed  Hx PE, on xarelto - continue xarelto   Chronic ILD  No recent PFTs accessible in chart. Continue flonase.  - follow up with pulm out patient   Mood disorder  - continue home duloxetine   HTN - stable  Aortic regurgitation - stable Hx CVA   FEN/GI:  . Fluids:  . Electrolytes: wnl   . Nutrition: HH   Access: PIV Left  VTE prophylaxis: Chronically Anticoagulated   Disposition: TBD -     Subjective:  NAEO.   Objective: Temp:  [97.6 F (36.4 C)-103.7 F (39.8 C)] 97.6 F  (36.4 C) (05/29 0808) Pulse Rate:  [67-135] 73 (05/29 0808) Cardiac Rhythm: Normal sinus rhythm (05/29 0700) Resp:  [17-35] 19 (05/29 0808) BP: (86-127)/(39-78) 111/66 (05/29 0808) SpO2:  [90 %-100 %] 93 % (05/29 0808) Intake/Output      05/28 0701 - 05/29 0700 05/29 0701 - 05/30 0700   P.O. 480    I.V. (mL/kg) 557.2 (6.3)    IV Piggyback 1054.4    Total Intake(mL/kg) 2091.6 (23.7)    Urine (mL/kg/hr) 1800 (0.8)    Total Output 1800    Net +291.6         Urine Occurrence 2 x        Physical Exam: Gen: NAD, alert, non-toxic, lying in bed comfortably, sleeping on her right side Skin: Warm and dry. No obvious rashes, lesions, or trauma. HEENT: NCAT. EOMI. No conjunctival pallor or injection. No scleral icterus or injection.  MMM.  Neck: Soft, supple. No LAD. No JVD. CV: RRR.  Normal S1, S2. No m/r/g.  RP 2+ bilaterally. No BLEE. Resp: Inspiratory crackles throughout and fine isolated expiratory wheezes.  No r/r.  No increased WOB Abd: +BS. NTND on palpation. Psych: Cooperative. Pleasant. Makes eye contact. Speech normal. Extremities: Moves extremities spontaneously. Extremities warm and well perfused.  Neuro: CN II-XII grossly intact. No FNDs.   Laboratory: I have personally read and reviewed all labs and imaging studies.  CBC: Recent Labs  Lab 02/04/20 1731 02/05/20 1509 02/06/20 0228  WBC 4.3 6.5 8.3  NEUTROABS 2.8  --   --   HGB 10.8* 11.7* 10.6*  HCT 36.3 38.9 36.4  MCV  73.2* 71.6* 74.0*  PLT 203 253 256   CMP: Recent Labs  Lab 02/04/20 1106 02/05/20 1509 02/06/20 0228  NA 132* 136 139  K 3.7 4.3 4.9  CL 101 104 111  CO2 20* 20* 20*  GLUCOSE 101* 93 157*  BUN 11 10 9   CREATININE 1.07* 0.96 0.81  CALCIUM 8.6* 9.3 8.4*  ALBUMIN 3.6 3.8 3.2*   CBG: No results for input(s): GLUCAP in the last 168 hours. Micro: Covid Negative  Recent Results (from the past 240 hour(s))  SARS Coronavirus 2 by RT PCR (hospital order, performed in [facility] hospital lab)  Nasopharyngeal Nasopharyngeal Swab     Status: Abnormal   Collection Time: 02/04/20  1:56 PM   Specimen: Nasopharyngeal Swab  Result Value Ref Range Status   SARS Coronavirus 2 POSITIVE (A) NEGATIVE Final    Comment: RESULT CALLED TO, READ BACK BY AND VERIFIED WITH: Chalmers Cater RN 16:30 02/04/20 (wilsonm) (NOTE) SARS-CoV-2 target nucleic acids are DETECTED SARS-CoV-2 RNA is generally detectable in upper respiratory specimens  during the acute phase of infection.  Positive results are indicative  of the presence of the identified virus, but do not rule out bacterial infection or co-infection with other pathogens not detected by the test.  Clinical correlation with patient history and  other diagnostic information is necessary to determine patient infection status.  The expected result is negative. Fact Sheet for Patients:   StrictlyIdeas.no  Fact Sheet for Healthcare Providers:   BankingDealers.co.za   This test is not yet approved or cleared by the [place] FDA and  has been authorized for detection and/or diagnosis of SARS-CoV-2 by FDA under an Emergency Use Authorization (EUA).  This EUA will remain in effect (meaning this test can  be used) for the duration of  the COVID-19 declaration under Section 564(b)(1) of the Act, 21 U.S.C. section 360-bbb-3(b)(1), unless the authorization is terminated or revoked sooner. Performed at [facility], [place] [address], [place], [place] [address]   Blood Culture (routine x 2)     Status: None (Preliminary result)   Collection Time: 02/04/20  3:21 PM   Specimen: BLOOD  Result Value Ref Range Status   Specimen Description BLOOD LEFT ANTECUBITAL  Final   Special Requests   Final    BOTTLES DRAWN AEROBIC AND ANAEROBIC Blood Culture adequate volume   Culture   Final    NO GROWTH 2 DAYS Performed at [facility], [place] [address], [place], [place] [address]    Report Status PENDING   Incomplete     Imaging/Diagnostic Tests: CT Head Wo Contrast  Result Date: 02/04/2020 CLINICAL DATA:  Acute pain due to trauma. EXAM: CT HEAD WITHOUT CONTRAST TECHNIQUE: Contiguous axial images were obtained from the base of the skull through the vertex without intravenous contrast. COMPARISON:  MRI dated November 25, 2015. CT head dated September 21, 2015. FINDINGS: Brain: No evidence of acute infarction, hemorrhage, hydrocephalus, extra-axial collection or mass lesion/mass effect. Vascular: No hyperdense vessel or unexpected calcification. Skull: Normal. Negative for fracture or focal lesion. Sinuses/Orbits: There is pansinus mucosal thickening. The mastoid air cells are essentially clear. Other: None. IMPRESSION: 1. No acute intracranial abnormality. 2. Pansinus mucosal thickening. Electronically Signed   By: Constance Holster M.D.   On: 02/04/2020 17:52   DG Chest Port 1 View  Result Date: 02/04/2020 CLINICAL DATA:  COVID-19.  Cough.  Shortness of breath. EXAM: PORTABLE CHEST 1 VIEW COMPARISON:  11/06/2018 FINDINGS: The heart size and pulmonary  vascularity are normal. Slight haziness at the left lung base laterally. Chronic scarring at the right lung base. No effusions. No bone abnormality of significance. IMPRESSION: Slight haziness at the left lung base laterally which could represent a small area of infiltrate. Scarring at the right lung base. Electronically Signed   By: Lorriane Shire M.D.   On: 02/04/2020 14:48    EKG Interpretation  Date/Time:  Thursday Feb 04 2020 15:07:22 EDT Ventricular Rate:  106 PR Interval:    QRS Duration: 80 QT Interval:  329 QTC Calculation: 437 R Axis:   63 Text Interpretation: Sinus tachycardia Probable left atrial enlargement Probable LVH with secondary repol abnrm Abnormal T, probable ischemia, lateral leads Confirmed by Virgel Manifold [phone]) on 02/04/2020 3:10:38 PM        Procedures:   Procedure Orders     Critical Care     ED EKG 12-Lead     EKG  12-Lead  Wilber Oliphant, MD 02/06/2020, 9:01 AM PGY-2, [place] Intern pager: [phone], text pages welcome

## 2020-02-07 LAB — CBC
HCT: 36.5 % (ref 36.0–46.0)
Hemoglobin: 11 g/dL — ABNORMAL LOW (ref 12.0–15.0)
MCH: 21.8 pg — ABNORMAL LOW (ref 26.0–34.0)
MCHC: 30.1 g/dL (ref 30.0–36.0)
MCV: 72.3 fL — ABNORMAL LOW (ref 80.0–100.0)
Platelets: 364 10*3/uL (ref 150–400)
RBC: 5.05 MIL/uL (ref 3.87–5.11)
RDW: 15.8 % — ABNORMAL HIGH (ref 11.5–15.5)
WBC: 10.8 10*3/uL — ABNORMAL HIGH (ref 4.0–10.5)
nRBC: 0 % (ref 0.0–0.2)

## 2020-02-07 LAB — COMPREHENSIVE METABOLIC PANEL
ALT: 33 U/L (ref 0–44)
AST: 51 U/L — ABNORMAL HIGH (ref 15–41)
Albumin: 3.1 g/dL — ABNORMAL LOW (ref 3.5–5.0)
Alkaline Phosphatase: 69 U/L (ref 38–126)
Anion gap: 7 (ref 5–15)
BUN: 8 mg/dL (ref 6–20)
CO2: 22 mmol/L (ref 22–32)
Calcium: 8.8 mg/dL — ABNORMAL LOW (ref 8.9–10.3)
Chloride: 108 mmol/L (ref 98–111)
Creatinine, Ser: 0.75 mg/dL (ref 0.44–1.00)
GFR calc Af Amer: >60 mL/min (ref >60)
GFR calc non Af Amer: >60 mL/min (ref >60)
Glucose, Bld: 133 mg/dL — ABNORMAL HIGH (ref 70–99)
Potassium: 4.1 mmol/L (ref 3.5–5.1)
Sodium: 137 mmol/L (ref 135–145)
Total Bilirubin: 0.4 mg/dL (ref 0.3–1.2)
Total Protein: 6 g/dL — ABNORMAL LOW (ref 6.5–8.1)

## 2020-02-07 LAB — C-REACTIVE PROTEIN: CRP: 4.2 mg/dL — ABNORMAL HIGH (ref <1.0)

## 2020-02-07 NOTE — Progress Notes (Signed)
SATURATION QUALIFICATIONS: (This note is used to comply with regulatory documentation for home oxygen)  Patient Saturations on Room Air at Rest = 90%  Patient Saturations on Room Air while Ambulating = 74%  Patient Saturations on 2 Liters of oxygen while Ambulating = 94%  Please briefly explain why patient needs home oxygen:  Pts oxygen dropped below 88% on room air while ambulating

## 2020-02-07 NOTE — Plan of Care (Signed)
MD made aware of Temp 101.3.  2130 Call back received. No new orders.

## 2020-02-07 NOTE — Care Management (Signed)
Referral made to [facility] for home oxygen. Anticipate DC in AM

## 2020-02-07 NOTE — Progress Notes (Signed)
Family Medicine Teaching Service Daily Progress Note Intern Pager: [phone]  Patient name: Andrea Berry Medical record number: [id number] Date of birth: Apr 27, 1966 Age: 54 y.o. Gender: female  Primary Care Provider: McDiarmid, Andrea Ohara, MD Consultants: IP CONSULT TO FAMILY PRACTICE Code Status: Full Code   Pt Overview and Major Events to Date:  Hospital Day: 4 02/04/2020: admitted for Nausea (COVID +) and Fatigue   5/27-6/1 Remdesevir  5/27-6/5 Decadron  Assessment and Plan: Andrea Berry is a 54 y.o. female who presented w/ COVID-19 and admitted for worsening dyspnea.  PMHx s/f chronic interstitial lung disease, history of PE on Xarelto, central neuropathic pain, neuromyelitis optica, steroid-induced osteopenia, migraine without aura, legal blindness, lupus, mood disorder, MCI.  COVID-19 Overnight, patient afebrile with stable vital signs. Now afebrile x24h. Patient had one desaturation to 88% yesterday afternoon, but has been stable on 1L [place] since then. During exam today, Lungs today with good air movement and continued crackles at R base.  D-dimer declining, fibrinogen declining, CRP declining.  Blood cultures negative x3 days. Plan to ambulate with pulse ox to assess if patient requires home O2 or not. Then home today if can get infusion tomorrow. -Continue remdesivir and Decadron -Ambulate with pulse ox -Supportive care  Anemia Hemoglobin 10.6 yesterday, awaiting labs today. - continue xarelto   SLE  - holding home prednisone - continue decadron   HxTransverse myelitis, Devic's Disease, 2018 Neuromyelitis optica Severe neuropathic pain Continue home scheduled Trileptal, Lyrica Home tramadol as needed  Hx PE, on xarelto - continue xarelto   Chronic ILD  No recent PFTs accessible in chart. Continue flonase.  - follow up with pulm out patient   Mood disorder  - continue home duloxetine   HTN - stable  Aortic regurgitation - stable Hx CVA   FEN/GI:  . Fluids:   . Electrolytes: wnl   . Nutrition: HH   Access: PIV Left  VTE prophylaxis: Chronically Anticoagulated   Disposition: TBD -     Subjective:  NAEO. Patient wishes to go home, feeling much better.  Objective: Temp:  [97.8 F (36.6 C)-98.5 F (36.9 C)] 98.2 F (36.8 C) (05/30 0408) Pulse Rate:  [64-87] 73 (05/30 0408) Cardiac Rhythm: Normal sinus rhythm (05/30 0717) Resp:  [17-21] 17 (05/30 0408) BP: (99-134)/(58-85) 124/78 (05/30 0408) SpO2:  [88 %-99 %] 99 % (05/30 0408) Intake/Output      05/29 0701 - 05/30 0700 05/30 0701 - 05/31 0700   P.O. 440    I.V. (mL/kg) 1805.1 (20.4)    IV Piggyback     Total Intake(mL/kg) 2245.1 (25.4)    Urine (mL/kg/hr) 1550 (0.7)    Total Output 1550    Net +695.1             Physical Exam: Gen: NAD, alert, blind, lying in bed comfortably Skin: Warm and dry. No obvious rashes, lesions, or trauma. HEENT: normocephalic, atraumatic. MMM.  Neck: Soft, supple.  CV: RRR.  Normal S1, S2. No m/r/g.  RP 2+ bilaterally. No LE edema Resp: Inspiratory crackles most noticeable in R base, good air flow diffusely.  No r/r.  No increased WOB Abd: +BS. Soft, NT, ND on palpation. Psych: Cooperative. Pleasant. Speech normal. Extremities: Moves extremities spontaneously. Extremities warm and well perfused.  Neuro: No focal neuro deficits.  Laboratory: I have personally read and reviewed all labs and imaging studies.  CBC: Recent Labs  Lab 02/04/20 1731 02/05/20 1509 02/06/20 0228  WBC 4.3 6.5 8.3  NEUTROABS 2.8  --   --  HGB 10.8* 11.7* 10.6*  HCT 36.3 38.9 36.4  MCV 73.2* 71.6* 74.0*  PLT 203 253 256   CMP: Recent Labs  Lab 02/04/20 1106 02/05/20 1509 02/06/20 0228  NA 132* 136 139  K 3.7 4.3 4.9  CL 101 104 111  CO2 20* 20* 20*  GLUCOSE 101* 93 157*  BUN 11 10 9   CREATININE 1.07* 0.96 0.81  CALCIUM 8.6* 9.3 8.4*  ALBUMIN 3.6 3.8 3.2*   Micro: Covid Positive  Recent Results (from the past 240 hour(s))  SARS Coronavirus 2 by  RT PCR (hospital order, performed in [facility] hospital lab) Nasopharyngeal Nasopharyngeal Swab     Status: Abnormal   Collection Time: 02/04/20  1:56 PM   Specimen: Nasopharyngeal Swab  Result Value Ref Range Status   SARS Coronavirus 2 POSITIVE (A) NEGATIVE Final    Comment: RESULT CALLED TO, READ BACK BY AND VERIFIED WITH: Chalmers Cater RN 16:30 02/04/20 ([email or web address]) (NOTE) SARS-CoV-2 target nucleic acids are DETECTED SARS-CoV-2 RNA is generally detectable in upper respiratory specimens  during the acute phase of infection.  Positive results are indicative  of the presence of the identified virus, but do not rule out bacterial infection or co-infection with other pathogens not detected by the test.  Clinical correlation with patient history and  other diagnostic information is necessary to determine patient infection status.  The expected result is negative. Fact Sheet for Patients:   StrictlyIdeas.no  Fact Sheet for Healthcare Providers:   BankingDealers.co.za   This test is not yet approved or cleared by the [place] FDA and  has been authorized for detection and/or diagnosis of SARS-CoV-2 by FDA under an Emergency Use Authorization (EUA).  This EUA will remain in effect (meaning this test can  be used) for the duration of  the COVID-19 declaration under Section 564(b)(1) of the Act, 21 U.S.C. section 360-bbb-3(b)(1), unless the authorization is terminated or revoked sooner. Performed at [facility], [place] [address] [place], [place] [address]   Blood Culture (routine x 2)     Status: None (Preliminary result)   Collection Time: 02/04/20  3:21 PM   Specimen: BLOOD  Result Value Ref Range Status   Specimen Description BLOOD LEFT ANTECUBITAL  Final   Special Requests   Final    BOTTLES DRAWN AEROBIC AND ANAEROBIC Blood Culture adequate volume   Culture   Final    NO GROWTH 3 DAYS Performed at [facility], [place]  [address], [place], [place] [address]    Report Status PENDING  Incomplete     Imaging/Diagnostic Tests: No results found.  EKG Interpretation  Date/Time:  Thursday Feb 04 2020 15:07:22 EDT Ventricular Rate:  106 PR Interval:    QRS Duration: 80 QT Interval:  329 QTC Calculation: 437 R Axis:   63 Text Interpretation: Sinus tachycardia Probable left atrial enlargement Probable LVH with secondary repol abnrm Abnormal T, probable ischemia, lateral leads Confirmed by Virgel Manifold [phone]) on 02/04/2020 3:10:38 PM        Procedures:   Procedure Orders     Critical Care     ED EKG 12-Lead     EKG 12-Lead  Gladys Damme, MD 02/07/2020, 8:43 AM PGY-1, [place] Intern pager: [phone], text pages welcome

## 2020-02-08 LAB — CBC
HCT: 31 % — ABNORMAL LOW (ref 36.0–46.0)
Hemoglobin: 9.5 g/dL — ABNORMAL LOW (ref 12.0–15.0)
MCH: 21.8 pg — ABNORMAL LOW (ref 26.0–34.0)
MCHC: 30.6 g/dL (ref 30.0–36.0)
MCV: 71.1 fL — ABNORMAL LOW (ref 80.0–100.0)
Platelets: 361 10*3/uL (ref 150–400)
RBC: 4.36 MIL/uL (ref 3.87–5.11)
RDW: 15.3 % (ref 11.5–15.5)
WBC: 10.5 10*3/uL (ref 4.0–10.5)
nRBC: 0 % (ref 0.0–0.2)

## 2020-02-08 LAB — COMPREHENSIVE METABOLIC PANEL
ALT: 24 U/L (ref 0–44)
AST: 25 U/L (ref 15–41)
Albumin: 2.5 g/dL — ABNORMAL LOW (ref 3.5–5.0)
Alkaline Phosphatase: 55 U/L (ref 38–126)
Anion gap: 9 (ref 5–15)
BUN: <5 mg/dL — ABNORMAL LOW (ref 6–20)
CO2: 22 mmol/L (ref 22–32)
Calcium: 8.3 mg/dL — ABNORMAL LOW (ref 8.9–10.3)
Chloride: 108 mmol/L (ref 98–111)
Creatinine, Ser: 0.81 mg/dL (ref 0.44–1.00)
GFR calc Af Amer: >60 mL/min (ref >60)
GFR calc non Af Amer: >60 mL/min (ref >60)
Glucose, Bld: 87 mg/dL (ref 70–99)
Potassium: 3.2 mmol/L — ABNORMAL LOW (ref 3.5–5.1)
Sodium: 139 mmol/L (ref 135–145)
Total Bilirubin: 1 mg/dL (ref 0.3–1.2)
Total Protein: 5.1 g/dL — ABNORMAL LOW (ref 6.5–8.1)

## 2020-02-08 LAB — CBC WITH DIFFERENTIAL/PLATELET
Abs Immature Granulocytes: 0.14 10*3/uL — ABNORMAL HIGH (ref 0.00–0.07)
Basophils Absolute: 0 10*3/uL (ref 0.0–0.1)
Basophils Relative: 0 %
Eosinophils Absolute: 0 10*3/uL (ref 0.0–0.5)
Eosinophils Relative: 0 %
HCT: 34 % — ABNORMAL LOW (ref 36.0–46.0)
Hemoglobin: 10.4 g/dL — ABNORMAL LOW (ref 12.0–15.0)
Immature Granulocytes: 1 %
Lymphocytes Relative: 4 %
Lymphs Abs: 0.4 10*3/uL — ABNORMAL LOW (ref 0.7–4.0)
MCH: 21.8 pg — ABNORMAL LOW (ref 26.0–34.0)
MCHC: 30.6 g/dL (ref 30.0–36.0)
MCV: 71.3 fL — ABNORMAL LOW (ref 80.0–100.0)
Monocytes Absolute: 0.2 10*3/uL (ref 0.1–1.0)
Monocytes Relative: 2 %
Neutro Abs: 9.5 10*3/uL — ABNORMAL HIGH (ref 1.7–7.7)
Neutrophils Relative %: 93 %
Platelets: 345 10*3/uL (ref 150–400)
RBC: 4.77 MIL/uL (ref 3.87–5.11)
RDW: 15.4 % (ref 11.5–15.5)
WBC: 10.3 10*3/uL (ref 4.0–10.5)
nRBC: 0 % (ref 0.0–0.2)

## 2020-02-08 LAB — PROCALCITONIN: Procalcitonin: 0.11 ng/mL

## 2020-02-08 LAB — C-REACTIVE PROTEIN: CRP: 11.9 mg/dL — ABNORMAL HIGH (ref <1.0)

## 2020-02-08 MED ORDER — POTASSIUM CHLORIDE CRYS ER 20 MEQ PO TBCR
40.0000 meq | EXTENDED_RELEASE_TABLET | Freq: Once | ORAL | Status: AC
  Administered 2020-02-08: 40 meq via ORAL
  Filled 2020-02-08: qty 2

## 2020-02-08 NOTE — Progress Notes (Addendum)
Family Medicine Teaching Service Daily Progress Note Intern Pager: [phone]  Patient name: Andrea Berry Medical record number: [id number] Date of birth: 11/09/1965 Age: 54 y.o. Gender: female  Primary Care Provider: McDiarmid, Blane Ohara, MD Consultants: IP CONSULT TO FAMILY PRACTICE Code Status: Full Code   Pt Overview and Major Events to Date:  Hospital Day: 5 02/04/2020: admitted for Nausea (COVID +) and Fatigue   5/27-5/31 Remdesevir  5/27-6/5 Decadron  Assessment and Plan: Andrea Berry is a 54 y.o. female who presented w/ COVID-19 and admitted for worsening dyspnea.  PMHx s/f chronic interstitial lung disease, history of PE on Xarelto, central neuropathic pain, neuromyelitis optica, steroid-induced osteopenia, migraine without aura, legal blindness, lupus, mood disorder, MCI.  COVID-19 Overnight patient had fever to 101.3*F again, today inflammatory markers, which had been declining, spiked. CRP went from 4.2 yesterday to 11.9 today. During exam today, pulmonary exam today with good air movement, no crackles at bases. Patient was not d/c'd yesterday as infusion center was closed today and so she will receive her last dose of Remdesivir today. However, due to spike in fever and inflammatory markers, may keep for observation again. Will obtain procalcitonin and blood culture to r/o possible bacterial co-infection, however patient appears clinically well.Will contact [facility] for close follow up tomorrow outpatient, if possible, then patient may discharge. Patient requires home oxygen as she desaturates occasionally at rest, but definitely with ambulation. -Continue Decadron until 6/5 -Remdesivir last dose today -procal, blood cx -Home O2 -Supportive care  Anemia Hemoglobin 11>10.6>11> 9.5. No signs of bleeding. Will continue to monitor. Platelets WNL at 361. - continue xarelto   SLE  - holding home prednisone - continue decadron   HxTransverse myelitis, Devic's Disease,  2018 Neuromyelitis optica Severe neuropathic pain Continue home scheduled Trileptal, Lyrica Home tramadol as needed  Hx PE, on xarelto - continue xarelto   Chronic ILD  No recent PFTs accessible in chart. Continue flonase.  - follow up with pulm out patient   Mood disorder  - continue home duloxetine   HTN - stable  Aortic regurgitation - stable Hx CVA   FEN/GI:  . Fluids:  . Electrolytes: wnl   . Nutrition: HH   Access: PIV Left  VTE prophylaxis: Chronically Anticoagulated   Disposition: to home once medically stable  Subjective:  Patient feels overall well today, would prefer to go home if able.  Objective: Temp:  [98.3 F (36.8 C)-101.3 F (38.5 C)] 98.3 F (36.8 C) (05/31 0456) Pulse Rate:  [75-99] 79 (05/31 0456) Cardiac Rhythm: Normal sinus rhythm (05/30 2000) Resp:  [19-25] 22 (05/31 0456) BP: (114-136)/(56-69) 114/69 (05/31 0456) SpO2:  [93 %-97 %] 97 % (05/31 0456) Intake/Output      05/30 0701 - 05/31 0700   P.O. 480   I.V. (mL/kg) 2477.2 (28.1)   Total Intake(mL/kg) 2957.2 (33.5)   Urine (mL/kg/hr) 2700 (1.3)   Total Output 2700   Net +257.2           Physical Exam: Gen: NAD, alert, blind, lying in bed comfortably Skin: Warm and dry. No obvious rashes, lesions, or trauma. HEENT: normocephalic, atraumatic. MMM.  Neck: Soft, supple.  CV: RRR.  Normal S1, S2. No m/r/g.  RP 2+ bilaterally. No LE edema Resp: CTAB, good air flow diffusely.  No r/r.  No increased WOB Abd: +BS. Soft, NT, ND on palpation. Psych: Cooperative. Pleasant. Speech normal. Extremities: Moves extremities spontaneously. Extremities warm and well perfused.  Neuro: No focal neuro deficits other than previously  known visual deficits  Laboratory: I have personally read and reviewed all labs and imaging studies.  CBC: Recent Labs  Lab 02/04/20 1731 02/05/20 1509 02/06/20 0228 02/07/20 1118 02/08/20 0408  WBC 4.3   < > 8.3 10.8* 10.5  NEUTROABS 2.8  --   --   --   --    HGB 10.8*   < > 10.6* 11.0* 9.5*  HCT 36.3   < > 36.4 36.5 31.0*  MCV 73.2*   < > 74.0* 72.3* 71.1*  PLT 203   < > 256 364 361   < > = values in this interval not displayed.   CMP: Recent Labs  Lab 02/06/20 0228 02/07/20 1118 02/08/20 0408  NA 139 137 139  K 4.9 4.1 3.2*  CL 111 108 108  CO2 20* 22 22  GLUCOSE 157* 133* 87  BUN 9 8 <5*  CREATININE 0.81 0.75 0.81  CALCIUM 8.4* 8.8* 8.3*  ALBUMIN 3.2* 3.1* 2.5*   Micro: Covid Positive  Recent Results (from the past 240 hour(s))  SARS Coronavirus 2 by RT PCR (hospital order, performed in [facility] hospital lab) Nasopharyngeal Nasopharyngeal Swab     Status: Abnormal   Collection Time: 02/04/20  1:56 PM   Specimen: Nasopharyngeal Swab  Result Value Ref Range Status   SARS Coronavirus 2 POSITIVE (A) NEGATIVE Final    Comment: RESULT CALLED TO, READ BACK BY AND VERIFIED WITH: Chalmers Cater RN 16:30 02/04/20 (wilsonm) (NOTE) SARS-CoV-2 target nucleic acids are DETECTED SARS-CoV-2 RNA is generally detectable in upper respiratory specimens  during the acute phase of infection.  Positive results are indicative  of the presence of the identified virus, but do not rule out bacterial infection or co-infection with other pathogens not detected by the test.  Clinical correlation with patient history and  other diagnostic information is necessary to determine patient infection status.  The expected result is negative. Fact Sheet for Patients:   StrictlyIdeas.no  Fact Sheet for Healthcare Providers:   BankingDealers.co.za   This test is not yet approved or cleared by the [place] FDA and  has been authorized for detection and/or diagnosis of SARS-CoV-2 by FDA under an Emergency Use Authorization (EUA).  This EUA will remain in effect (meaning this test can  be used) for the duration of  the COVID-19 declaration under Section 564(b)(1) of the Act, 21 U.S.C. section 360-bbb-3(b)(1),  unless the authorization is terminated or revoked sooner. Performed at [facility], [place] [address], [place], [place] [address]   Blood Culture (routine x 2)     Status: None (Preliminary result)   Collection Time: 02/04/20  3:21 PM   Specimen: BLOOD  Result Value Ref Range Status   Specimen Description BLOOD LEFT ANTECUBITAL  Final   Special Requests   Final    BOTTLES DRAWN AEROBIC AND ANAEROBIC Blood Culture adequate volume   Culture   Final    NO GROWTH 3 DAYS Performed at [facility], [place] [address] [place], [place] [address]    Report Status PENDING  Incomplete     Imaging/Diagnostic Tests: No results found.  EKG Interpretation  Date/Time:  Thursday Feb 04 2020 15:07:22 EDT Ventricular Rate:  106 PR Interval:    QRS Duration: 80 QT Interval:  329 QTC Calculation: 437 R Axis:   63 Text Interpretation: Sinus tachycardia Probable left atrial enlargement Probable LVH with secondary repol abnrm Abnormal T, probable ischemia, lateral leads Confirmed by Virgel Manifold [phone]) on 02/04/2020 3:10:38  PM        Procedures:   Procedure Orders     Critical Care     ED EKG 12-Lead     EKG 12-Lead  Gladys Damme, MD 02/08/2020, 6:50 AM PGY-1, [place] Intern pager: [phone], text pages welcome

## 2020-02-08 NOTE — TOC Transition Note (Signed)
Transition of Care [facility]) - CM/SW Discharge Note   Patient Details  Name: Andrea Berry MRN: [id number] Date of Birth: 13-Mar-1966  [facility] CM/SW Contact:  [name], RN Phone Number: [phone] 02/08/2020, 3:34 PM   Clinical Narrative:  54 yr old female admitted and treated for COVID 19. Case manager spoke with patient concerning need for HHPT at discharge. Patient politely declined, states she does not feel she needs it. She will have family support at discharge and will contact her MD if things change. No further CM needs identified.     Final next level of care: Home/Self Care Barriers to Discharge: Continued Medical Work up   Patient Goals and CMS Choice     Choice offered to / list presented to : Patient  Discharge Placement                       Discharge Plan and Services   Discharge Planning Services: CM Consult Post Acute Care Choice: Home Health          DME Arranged: N/A DME Agency: NA       HH Arranged: Refused [place] Agency: NA        Social Determinants of Health (SDOH) Interventions     Readmission Risk Interventions No flowsheet data found.

## 2020-02-08 NOTE — Evaluation (Signed)
Occupational Therapy Evaluation Patient Details Name: Andrea Berry MRN: [id number] DOB: Jul 01, 1966 Today's Date: 02/08/2020    History of Present Illness 54 y.o. female admitted on 02/04/20 with acute hypoxic respiratory failure due to COVID 19.  Of note, she was fully vaccinated ~ 1 month PTA.  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     Clinical Impression   PTA, pt was living her son and performed BADLs with assistance from aide for bathing and IADLs. Pt currently performing ADLs and functional mobility at Supervision level with RW. Pt presenting with decreased activity tolerance requiring increased time during session. SpO2 >90% on 2L. Pt would benefit from further acute OT to provide education on energy conservation and facilitate safe dc. Recommend dc to home once medically stable per physician.      Follow Up Recommendations  No OT follow up;Supervision - Intermittent    Equipment Recommendations  None recommended by OT    Recommendations for Other Services PT consult     Precautions / Restrictions Precautions Precautions: Fall;Other (comment) Precaution Comments: monitor O2 Restrictions Weight Bearing Restrictions: No      Mobility Bed Mobility Overal bed mobility: Modified Independent                Transfers Overall transfer level: Needs assistance Equipment used: Rolling walker (2 wheeled) Transfers: Sit to/from Stand Sit to Stand: Supervision         General transfer comment: Supervision for safety    Balance Overall balance assessment: Needs assistance Sitting-balance support: Feet supported;No upper extremity supported Sitting balance-Leahy Scale: Good     Standing balance support: During functional activity;No upper extremity supported Standing balance-Leahy Scale: Fair                             ADL either performed or assessed with  clinical judgement   ADL Overall ADL's : Needs assistance/impaired Eating/Feeding: Set up;Sitting Eating/Feeding Details (indicate cue type and reason): Requiring assist to set up food at table due to visual deficits.  Grooming: Dance movement psychotherapist;Wash/dry hands;Supervision/safety                                 General ADL Comments: Pt performing ADLs and functional mobility at Supervision level with RW. Pt requiring increased time for fatigue and visual deficits. Pt performing mobility in room and then grooming at sink. Set up pt for lunch at bed (reports chair hurts her back).      Vision Baseline Vision/History: Legally blind Patient Visual Report: Other (comment)(Reports she can see shadows. Significant light sensitivity)       Perception     Praxis      Pertinent Vitals/Pain Pain Assessment: Faces Faces Pain Scale: No hurt     Hand Dominance Right   Extremity/Trunk Assessment Upper Extremity Assessment Upper Extremity Assessment: Generalized weakness   Lower Extremity Assessment Lower Extremity Assessment: Defer to PT evaluation   Cervical / Trunk Assessment Cervical / Trunk Assessment: Normal   Communication Communication Communication: No difficulties   Cognition Arousal/Alertness: Awake/alert Behavior During Therapy: Flat affect Overall Cognitive Status: Within Functional Limits for tasks assessed                                 General  Comments: Initially flat but as session progressed, pt making jokes and laughing   General Comments  SpO2 >90% on 2L throughout    Exercises     Shoulder Instructions      Home Living Family/patient expects to be discharged to:: Private residence Living Arrangements: Children(son) Available Help at Discharge: Family;Personal care attendant(son can take a few days off work if needed) Type of Home: House Home Access: Level entry     [place]: One level     Bathroom Shower/Tub: Arts administrator: [place]: Environmental consultant - 2 wheels;Shower seat;Grab bars - tub/shower;Hand held shower head   Additional Comments: reports she is active with services for the blind      Prior Functioning/Environment Level of Independence: Independent with assistive device(s);Needs assistance  Gait / Transfers Assistance Needed: walks with RW at baseline ADL's / Homemaking Assistance Needed: does her own dressing and toileting, waits for the aide to bathe.  She helps her son cook, but he does most of the cleaning and housework.    Comments: Has an aide 2-3 hours per day 7 days per week.         OT Problem List: Decreased activity tolerance;Impaired balance (sitting and/or standing);Decreased knowledge of use of DME or AE;Decreased knowledge of precautions      OT Treatment/Interventions: Self-care/ADL training;Therapeutic exercise;Energy conservation;DME and/or AE instruction;Therapeutic activities;Patient/family education    OT Goals(Current goals can be found in the care plan section) Acute Rehab OT Goals Patient Stated Goal: to get better and get back home OT Goal Formulation: With patient Time For Goal Achievement: 02/22/20 Potential to Achieve Goals: Good  OT Frequency: Min 2X/week   Barriers to D/C:            Co-evaluation              AM-PAC OT "6 Clicks" Daily Activity     Outcome Measure Help from another person eating meals?: A Little Help from another person taking care of personal grooming?: A Little Help from another person toileting, which includes using toliet, bedpan, or urinal?: A Little Help from another person bathing (including washing, rinsing, drying)?: A Little Help from another person to put on and taking off regular upper body clothing?: A Little Help from another person to put on and taking off regular lower body clothing?: A Little 6 Click Score: 18   End of Session Equipment Utilized During Treatment: Rolling  walker;Oxygen(2L) Nurse Communication: Mobility status  Activity Tolerance: Patient tolerated treatment well Patient left: in bed;with call bell/phone within reach  OT Visit Diagnosis: Unsteadiness on feet (R26.81);Other abnormalities of gait and mobility (R26.89);Muscle weakness (generalized) (M62.81)                Time: [id number] OT Time Calculation (min): 22 min Charges:  OT General Charges $OT Visit: 1 Visit OT Evaluation $OT Eval Moderate Complexity: [place], OTR/L Acute Rehab Pager: [phone] Office: [place] 02/08/2020, 2:34 PM

## 2020-02-08 NOTE — Progress Notes (Deleted)
[facility] Discharge Summary  Patient name: Andrea Berry Medical record number: [id number] Date of birth: June 30, 1966 Age: 54 y.o. Gender: female Date of Admission: 02/04/2020  Date of Discharge: 02/09/2020 Admitting Physician: Leeanne Rio, MD  Primary Care Provider: McDiarmid, Blane Ohara, MD Consultants: none  Indication for Hospitalization: COVID-19 infection with hypoxia  Discharge Diagnoses/Problem List:   COVID-19 Legally blind SLE Devic's disease, neuromyelitis optica Chronic interstitial lung disease History of pulmonary embolism on Xarelto Chronic central neuropathic pain Mild cognitive impairment Mood disorder Steroid-induced osteopenia Migraine without aura  Disposition: To home  Discharge Condition:Stable and improved  Discharge Exam:  Physical Exam: Gen: NAD, alert, blind, lying in bed comfortably Skin: Warm and dry. No obvious rashes, lesions, or trauma. HEENT: normocephalic, atraumatic. MMM.  Neck: Soft, supple.  CV: RRR.  Normal S1, S2. No m/r/g.  RP 2+ bilaterally. No LE edema Resp: CTAB, good air flow diffusely.  No r/r.  No increased WOB Abd: +BS. Soft, NT, ND on palpation. Psych: Cooperative. Pleasant. Speech normal. Extremities: Moves extremities spontaneously. Extremities warm and well perfused.  Neuro: No focal neuro deficits other than previously known visual deficits  Brief Hospital Course:   COVID-19 with hypoxia Patient presented on May 27 with new oxygen requirement and tested positive for Covid-19.  For full admission details please see H&P.  Patient was treated with oxygen via nasal cannula, remdesivir for 5 days (5/27-31), and Decadron which will continue for a total of 10 days, to end on June 5.  Patient intermittently needed oxygen while resting, but will require oxygen for ambulation.  DME oxygen was ordered for the patient prior to discharge.  Patient had mildly elevated AST to 54 on 5/28, likely due to  remdesivir.  However AST and ALT were within normal limits on 5/31 at 25/24.  Inflammatory markers were elevated on admission, but down trended.  On 2 days prior to discharge the patient CRP and white count began to trend up again but this can be explained by the patient's initiation of Decadron.  Her procalcitonin was within normal limits at 0.11.  She was discharged home with the remainder of her Decadron doses in the Covid clinic follow-up scheduled for 02/16/2020.  She was instructed to not take her prednisone until her Decadron course has been completed.  Issues for Follow Up:  1. Covid clinic follow-up on 02/16/2020 at 9 AM 2. Ensure discontinuation of Decadron on 6/5 with restarting prednisone on 6/6 3. Monitor need for home O2 with ambulation  Significant Procedures: none  Significant Labs and Imaging:  Recent Labs  Lab 02/08/20 0408 02/08/20 1451 02/09/20 0550  WBC 10.5 10.3 12.4*  HGB 9.5* 10.4* 10.0*  HCT 31.0* 34.0* 31.8*  PLT 361 345 477*   Recent Labs  Lab 02/05/20 1509 02/05/20 1509 02/06/20 0228 02/06/20 0228 02/07/20 1118 02/07/20 1118 02/08/20 0408 02/09/20 0550  NA 136  --  139  --  137  --  139 141  K 4.3   < > 4.9   < > 4.1   < > 3.2* 3.3*  CL 104  --  111  --  108  --  108 109  CO2 20*  --  20*  --  22  --  22 22  GLUCOSE 93  --  157*  --  133*  --  87 98  BUN 10  --  9  --  8  --  <5* 7  CREATININE 0.96  --  0.81  --  0.75  --  0.81 0.87  CALCIUM 9.3  --  8.4*  --  8.8*  --  8.3* 8.9  MG  --   --   --   --   --   --   --  1.7  ALKPHOS 78  --  63  --  69  --  55 55  AST 54*  --  37  --  51*  --  25 20  ALT 41  --  32  --  33  --  24 23  ALBUMIN 3.8  --  3.2*  --  3.1*  --  2.5* 2.8*   < > = values in this interval not displayed.   EXAM: 02/04/20 CT HEAD WITHOUT CONTRAST COMPARISON:  MRI dated November 25, 2015. CT head dated September 21, 2015. FINDINGS: Brain: No evidence of acute infarction, hemorrhage, hydrocephalus, extra-axial collection or mass  lesion/mass effect. Vascular: No hyperdense vessel or unexpected calcification. Skull: Normal. Negative for fracture or focal lesion. Sinuses/Orbits: There is pansinus mucosal thickening. The mastoid air cells are essentially clear. Other: None. IMPRESSION: 1. No acute intracranial abnormality. 2. Pansinus mucosal thickening.  Results/Tests Pending at Time of Discharge: none  Discharge Medications:  Allergies as of 02/09/2020   No Known Allergies     Medication List    TAKE these medications   alendronate 70 MG tablet Commonly known as: FOSAMAX TAKE 1 TABLET BY MOUTH IN THE MORNING EVERY 7 DAYS What changed:   how much to take  how to take this  when to take this  additional instructions   azelastine 0.1 % nasal spray Commonly known as: ASTELIN Place 2 sprays into both nostrils 2 (two) times daily as needed for rhinitis.   dexamethasone 6 MG tablet Commonly known as: DECADRON Take 1 tablet (6 mg total) by mouth daily.   DULoxetine 60 MG capsule Commonly known as: CYMBALTA TAKE 1 CAPSULE BY MOUTH EVERY DAY What changed: how much to take   esomeprazole 40 MG capsule Commonly known as: NEXIUM TAKE 1 CAPSULE BY MOUTH TWICE A DAY What changed: when to take this   famotidine 40 MG tablet Commonly known as: PEPCID Take 40 mg by mouth 2 (two) times daily.   fluticasone 50 MCG/ACT nasal spray Commonly known as: FLONASE Place 1 spray into both nostrils daily.   ipratropium 0.03 % nasal spray Commonly known as: ATROVENT PLACE 2 SPRAYS INTO BOTH NOSTRILS EVERY 12 (TWELVE) HOURS.   Lastacaft 0.25 % Soln Generic drug: Alcaftadine Place 1 drop into both eyes 2 (two) times daily.   Linzess 145 MCG Caps capsule Generic drug: linaclotide TAKE ONE CAPSULE EVERY DAY ON EMPTY STOMACH AT LEAST 30MIN BEFORE 1ST MEAL OF THE DAY What changed: See the new instructions.   LORazepam 0.5 MG tablet Commonly known as: ATIVAN TAKE 1 TABLET (0.5 MG TOTAL) BY MOUTH 2 (TWO) TIMES  DAILY AS NEEDED FOR ANXIETY.   OXcarbazepine 150 MG tablet Commonly known as: TRILEPTAL Take 150 mg by mouth See admin instructions. Takes 300 mg  in the morning, 150 mg in afternoon and 300 mg at night.   predniSONE 5 MG tablet Commonly known as: DELTASONE Take 1 tablet (5 mg total) by mouth daily.   pregabalin 200 MG capsule Commonly known as: LYRICA TAKE 1 CAPSULE BY MOUTH THREE TIMES A DAY What changed: See the new instructions.   RITUXAN IV Inject 1,000 mg into the vein See admin instructions. Every 6 months.   rivaroxaban 10 MG  Tabs tablet Commonly known as: XARELTO Take 1 tablet (10 mg total) by mouth daily. With a meal   tiZANidine 4 MG tablet Commonly known as: ZANAFLEX Take 4 mg by mouth at bedtime as needed.   traMADol 50 MG tablet Commonly known as: ULTRAM TAKE 1 TABLET (50 MG TOTAL) BY MOUTH EVERY 6 (SIX) HOURS AS NEEDED. What changed: reasons to take this            Durable Medical Equipment  (From admission, onward)         Start     Ordered   02/07/20 1314  For home use only DME oxygen  Once    Question Answer Comment  Length of Need 6 Months   Mode or (Route) Nasal cannula   Liters per Minute 2   Frequency Continuous (stationary and portable oxygen unit needed)   Oxygen delivery system Gas      02/07/20 1313          Discharge Instructions: Please refer to Patient Instructions section of EMR for full details.  Patient was counseled important signs and symptoms that should prompt return to medical care, changes in medications, dietary instructions, activity restrictions, and follow up appointments.   Follow-Up Appointments: Follow-up Information    [facility] Follow up.   Specialty: Family Medicine Why: Call to schedul a follow up appointment  Contact information: [place] [phone]       [place] Follow up.   Why: home oxygen          Gifford Shave, MD 02/09/2020, 2:09 PM PGY-1, [place]

## 2020-02-08 NOTE — Progress Notes (Addendum)
Physical Therapy Treatment Patient Details Name: Andrea Berry MRN: [id number] DOB: November 01, 1965 Today's Date: 02/08/2020    History of Present Illness 54 y.o. female admitted on 02/04/20 with acute hypoxic respiratory failure due to COVID 19.  Of note, she was fully vaccinated ~ 1 month PTA.  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      PT Comments    Pt making steady progress. Continues to require supplemental O2 with activity.    Follow Up Recommendations  No PT follow up     Equipment Recommendations  Other (comment)(home O2)    Recommendations for Other Services       Precautions / Restrictions Precautions Precautions: Fall;Other (comment) Precaution Comments: monitor O2 Restrictions Weight Bearing Restrictions: No    Mobility  Bed Mobility Overal bed mobility: Modified Independent                Transfers Overall transfer level: Needs assistance Equipment used: Rolling walker (2 wheeled) Transfers: Sit to/from Stand Sit to Stand: Supervision         General transfer comment: supervision for lines/safety  Ambulation/Gait Ambulation/Gait assistance: Min guard Gait Distance (Feet): 150 Feet Assistive device: Rolling walker (2 wheeled) Gait Pattern/deviations: Step-through pattern;Decreased stride length Gait velocity: decreased Gait velocity interpretation: <1.8 ft/sec, indicate of risk for recurrent falls General Gait Details: Assist for lines and safety. Pt able to negotiate walker around obstacles.    Stairs             Wheelchair Mobility    Modified Rankin (Stroke Patients Only)       Balance Overall balance assessment: Needs assistance Sitting-balance support: Feet supported;No upper extremity supported Sitting balance-Leahy Scale: Good     Standing balance support: No upper extremity supported;During functional activity Standing  balance-Leahy Scale: Fair                              Cognition Arousal/Alertness: Awake/alert Behavior During Therapy: WFL for tasks assessed/performed Overall Cognitive Status: Within Functional Limits for tasks assessed                                       Exercises      General Comments General comments (skin integrity, edema, etc.): SpO2 87% on RA with amb. On 2L with amb >90%.      Pertinent Vitals/Pain Pain Assessment: Faces Faces Pain Scale: No hurt    Home Living Family/patient expects to be discharged to:: Private residence Living Arrangements: Children(son) Available Help at Discharge: Family;Personal care attendant(son can take a few days off work if needed) Type of Home: House Home Access: Level entry   Home Layout: One level Home Equipment: Environmental consultant - 2 wheels;Shower seat;Grab bars - tub/shower;Hand held shower head Additional Comments: reports she is active with services for the blind    Prior Function Level of Independence: Independent with assistive device(s);Needs assistance  Gait / Transfers Assistance Needed: walks with RW at baseline ADL's / Homemaking Assistance Needed: does her own dressing and toileting, waits for the aide to bathe.  She helps her son cook, but he does most of the cleaning and housework.  Comments: Has an aide 2-3 hours per day 7 days per week.    PT Goals (current goals can now be found in the care  plan section) Acute Rehab PT Goals Patient Stated Goal: to get better and get back home Progress towards PT goals: Progressing toward goals    Frequency    Min 3X/week      PT Plan Discharge plan needs to be updated    Co-evaluation              AM-PAC PT "6 Clicks" Mobility   Outcome Measure  Help needed turning from your back to your side while in a flat bed without using bedrails?: None Help needed moving from lying on your back to sitting on the side of a flat bed without using  bedrails?: None Help needed moving to and from a bed to a chair (including a wheelchair)?: None Help needed standing up from a chair using your arms (e.g., wheelchair or bedside chair)?: A Little Help needed to walk in hospital room?: A Little Help needed climbing 3-5 steps with a railing? : A Little 6 Click Score: 21    End of Session Equipment Utilized During Treatment: Oxygen Activity Tolerance: Patient tolerated treatment well Patient left: in bed;with call bell/phone within reach   PT Visit Diagnosis: Muscle weakness (generalized) (M62.81);Difficulty in walking, not elsewhere classified (R26.2)     Time: [id number] PT Time Calculation (min) (ACUTE ONLY): 16 min  Charges:  $Gait Training: 8-22 mins                     [place] Pager [phone] Office [place] 02/08/2020, 3:50 PM

## 2020-02-08 NOTE — Progress Notes (Signed)
Called the outpatient covid clinic, [phone]. Office is closed today so cannot schedule appointment, no VM available.

## 2020-02-09 LAB — CBC
HCT: 31.8 % — ABNORMAL LOW (ref 36.0–46.0)
Hemoglobin: 10 g/dL — ABNORMAL LOW (ref 12.0–15.0)
MCH: 22.1 pg — ABNORMAL LOW (ref 26.0–34.0)
MCHC: 31.4 g/dL (ref 30.0–36.0)
MCV: 70.4 fL — ABNORMAL LOW (ref 80.0–100.0)
Platelets: 477 10*3/uL — ABNORMAL HIGH (ref 150–400)
RBC: 4.52 MIL/uL (ref 3.87–5.11)
RDW: 15.5 % (ref 11.5–15.5)
WBC: 12.4 10*3/uL — ABNORMAL HIGH (ref 4.0–10.5)
nRBC: 0 % (ref 0.0–0.2)

## 2020-02-09 LAB — COMPREHENSIVE METABOLIC PANEL
ALT: 23 U/L (ref 0–44)
AST: 20 U/L (ref 15–41)
Albumin: 2.8 g/dL — ABNORMAL LOW (ref 3.5–5.0)
Alkaline Phosphatase: 55 U/L (ref 38–126)
Anion gap: 10 (ref 5–15)
BUN: 7 mg/dL (ref 6–20)
CO2: 22 mmol/L (ref 22–32)
Calcium: 8.9 mg/dL (ref 8.9–10.3)
Chloride: 109 mmol/L (ref 98–111)
Creatinine, Ser: 0.87 mg/dL (ref 0.44–1.00)
GFR calc Af Amer: >60 mL/min (ref >60)
GFR calc non Af Amer: >60 mL/min (ref >60)
Glucose, Bld: 98 mg/dL (ref 70–99)
Potassium: 3.3 mmol/L — ABNORMAL LOW (ref 3.5–5.1)
Sodium: 141 mmol/L (ref 135–145)
Total Bilirubin: 0.5 mg/dL (ref 0.3–1.2)
Total Protein: 5.7 g/dL — ABNORMAL LOW (ref 6.5–8.1)

## 2020-02-09 LAB — MAGNESIUM: Magnesium: 1.7 mg/dL (ref 1.7–2.4)

## 2020-02-09 LAB — CULTURE, BLOOD (ROUTINE X 2)
Culture: NO GROWTH
Special Requests: ADEQUATE

## 2020-02-09 LAB — C-REACTIVE PROTEIN: CRP: 13.3 mg/dL — ABNORMAL HIGH (ref <1.0)

## 2020-02-09 MED ORDER — DEXAMETHASONE 6 MG PO TABS
6.0000 mg | ORAL_TABLET | Freq: Every day | ORAL | 0 refills | Status: DC
Start: 2020-02-09 — End: 2020-03-04

## 2020-02-09 MED ORDER — POTASSIUM CHLORIDE CRYS ER 20 MEQ PO TBCR
40.0000 meq | EXTENDED_RELEASE_TABLET | ORAL | Status: AC
  Administered 2020-02-09 (×2): 40 meq via ORAL
  Filled 2020-02-09 (×2): qty 2

## 2020-02-09 NOTE — TOC Progression Note (Signed)
Transition of Care [facility]) - Progression Note    Patient Details  Name: Andrea Berry MRN: [id number] Date of Birth: 1965/11/13  [facility] CM/SW Contact  Maryclare Labrador, RN Phone Number: 02/09/2020, 11:09 AM  Clinical Narrative: Pt in agreement with home oxygen as ordered.  CM offered agency choice - [facility] chosen - [facility] accepts referral and will provide pt POC prior to discharge. CM confirmed home address and phone number to be correct in Epic.   Pt informed CM that he children will transport her home at discharge    Expected Discharge Plan: Home/Self Care Barriers to Discharge: Continued Medical Work up  Expected Discharge Plan and Services Expected Discharge Plan: Home/Self Care   Discharge Planning Services: CM Consult Post Acute Care Choice: [place] arrangements for the past 2 months: Single Family Home                 DME Arranged: Oxygen DME Agency: [facility] Date DME Agency Contacted: 02/09/20 Time DME Agency Contacted: [phone] Representative spoke with at DME Agency: [facility]ack HH Arranged: Refused [place] Agency: NA         Social Determinants of Health ([place]) Interventions    Readmission Risk Interventions No flowsheet data found.

## 2020-02-09 NOTE — Progress Notes (Signed)
Tyna Jaksch to be D/C'd Home per MD order.  Discussed with the patient and all questions fully answered.  VSS, Skin clean, dry and intact without evidence of skin break down, no evidence of skin tears noted. IV catheter discontinued intact. Site without signs and symptoms of complications. Dressing and pressure applied.  An After Visit Summary was printed and given to the patient. Patient received prescription.  D/c education completed with patient including follow up instructions, medication list, d/c activities limitations if indicated, with other d/c instructions as indicated by MD - patient able to verbalize understanding, all questions fully answered.   Patient instructed to return to ED, call 911, or call MD for any changes in condition.   Patient escorted via [place], and D/C home via private auto.  Jeanella Craze 02/09/2020 3:06 PM

## 2020-02-09 NOTE — Progress Notes (Signed)
SATURATION QUALIFICATIONS: (This note is used to comply with regulatory documentation for home oxygen)  Patient Saturations on Room Air at Rest = 90%  Patient Saturations on Room Air while Ambulating = 86%  Patient Saturations on 2 Liters of oxygen while Ambulating = 95%  Please briefly explain why patient needs home oxygen: O2 saturations still dipping into low-mid 80s with ambulation. Still requiring O2 with activity at this time.

## 2020-02-09 NOTE — Discharge Instructions (Signed)
Thank you for allowing [place] to participate in your care!    You were admitted for COVID-19 infection with new oxygen requirement.  Her lab work was monitored during her hospitalization as well as your oxygen requirement.  You were given remdesivir and Decadron.  Your breathing improved and you completed the remdesivir treatment course.  You were discharged after completing your remdesivir course.  We have called and scheduled an appointment with the Covid clinic on 02/16/2020 at 9 AM.  This clinic is located at[address], [place].  I would like for you to continue the Decadron and do not take your home prednisone after discharge until June 6.  On June 6 your Decadron course will end and you should restart your 5 mg of prednisone daily.  You will then you tested positive for Covid on 02/04/2020 so will need to quarantine until 02/15/2020.   If you experience worsening of your admission symptoms, develop shortness of breath, life threatening emergency, suicidal or homicidal thoughts you must seek medical attention immediately by calling 911 or calling your MD immediately  if symptoms less severe.   10 Things You Can Do to Manage Your COVID-19 Symptoms at Home If you have possible or confirmed COVID-19: 1. Stay home from work and school. And stay away from other public places. If you must go out, avoid using any kind of public transportation, ridesharing, or taxis. 2. Monitor your symptoms carefully. If your symptoms get worse, call your healthcare provider immediately. 3. Get rest and stay hydrated. 4. If you have a medical appointment, call the healthcare provider ahead of time and tell them that you have or may have COVID-19. 5. For medical emergencies, call 911 and notify the dispatch personnel that you have or may have COVID-19. 6. Cover your cough and sneezes with a tissue or use the inside of your elbow. 7. Wash your hands often with soap and water for at least 20 seconds or clean your hands  with an alcohol-based hand sanitizer that contains at least 60% alcohol. 8. As much as possible, stay in a specific room and away from other people in your home. Also, you should use a separate bathroom, if available. If you need to be around other people in or outside of the home, wear a mask. 9. Avoid sharing personal items with other people in your household, like dishes, towels, and bedding. 10. Clean all surfaces that are touched often, like counters, tabletops, and doorknobs. Use household cleaning sprays or wipes according to the label instructions. [email or web address] 03/11/2019 This information is not intended to replace advice given to you by your health care provider. Make sure you discuss any questions you have with your health care provider. Document Revised: 08/13/2019 Document Reviewed: 08/13/2019 Elsevier Patient Education  [place].

## 2020-02-09 NOTE — Progress Notes (Signed)
She continues to feel well. Denies any new concerns. Afebrile over 24 hours.   No acute findings on physical exam.  COVID-19 She completed her Remdesivir and has four more days of Decadron left.  Plan to discuss the conversion of IV Decadron to oral for four more days. She already has home O2. Appointment scheduled with "Post-Covid-19 Care" clinic on 02/16/20 at 9 AM for further monitoring. Address: [address], [place], [place] Phone: [phone]

## 2020-02-09 NOTE — Discharge Summary (Signed)
[facility] Discharge Summary  Patient name: Andrea Berry Medical record number: [id number] Date of birth: 1966-03-12 Age: 54 y.o. Gender: female Date of Admission: 02/04/2020  Date of Discharge: 02/09/2020 Admitting Physician: Leeanne Rio, MD  Primary Care Provider: McDiarmid, Blane Ohara, MD Consultants: none  Indication for Hospitalization: COVID-19 infection with hypoxia  Discharge Diagnoses/Problem List:   COVID-19 Legally blind SLE Devic's disease, neuromyelitis optica Chronic interstitial lung disease History of pulmonary embolism on Xarelto Chronic central neuropathic pain Mild cognitive impairment Mood disorder Steroid-induced osteopenia Migraine without aura  Disposition: To home  Discharge Condition:Stable and improved  Discharge Exam:  Physical Exam: Gen: NAD, alert, blind, lying in bed comfortably Skin: Warm and dry. No obvious rashes, lesions, or trauma. HEENT: normocephalic, atraumatic. MMM.  Neck: Soft, supple.  CV: RRR.  Normal S1, S2. No m/r/g.  RP 2+ bilaterally. No LE edema Resp: CTAB, good air flow diffusely.  No r/r.  No increased WOB Abd: +BS. Soft, NT, ND on palpation. Psych: Cooperative. Pleasant. Speech normal. Extremities: Moves extremities spontaneously. Extremities warm and well perfused.  Neuro: No focal neuro deficits other than previously known visual deficits  Brief Hospital Course:   COVID-19 with hypoxia Patient presented on May 27 with new oxygen requirement and tested positive for Covid-19.  For full admission details please see H&P.  Patient was treated with oxygen via nasal cannula, remdesivir for 5 days (5/27-31), and Decadron which will continue for a total of 10 days, to end on June 5.  Patient intermittently needed oxygen while resting, but will require oxygen for ambulation.  DME oxygen was ordered for the patient prior to discharge.  Patient had mildly elevated AST to 54 on 5/28, likely due to  remdesivir.  However AST and ALT were within normal limits on 5/31 at 25/24.  Inflammatory markers were elevated on admission, but down trended.  On 2 days prior to discharge the patient CRP and white count began to trend up again but this can be explained by the patient's initiation of Decadron.  Her procalcitonin was within normal limits at 0.11.  She was discharged home with the remainder of her Decadron doses in the Covid clinic follow-up scheduled for 02/16/2020.  She was instructed to not take her prednisone until her Decadron course has been completed.  Issues for Follow Up:  1. Covid clinic follow-up on 02/16/2020 at 9 AM 2. Ensure discontinuation of Decadron on 6/5 with restarting prednisone on 6/6 3. Monitor need for home O2 with ambulation  Significant Procedures: none  Significant Labs and Imaging:  Recent Labs  Lab 02/08/20 0408 02/08/20 1451 02/09/20 0550  WBC 10.5 10.3 12.4*  HGB 9.5* 10.4* 10.0*  HCT 31.0* 34.0* 31.8*  PLT 361 345 477*   Recent Labs  Lab 02/05/20 1509 02/05/20 1509 02/06/20 0228 02/06/20 0228 02/07/20 1118 02/07/20 1118 02/08/20 0408 02/09/20 0550  NA 136  --  139  --  137  --  139 141  K 4.3   < > 4.9   < > 4.1   < > 3.2* 3.3*  CL 104  --  111  --  108  --  108 109  CO2 20*  --  20*  --  22  --  22 22  GLUCOSE 93  --  157*  --  133*  --  87 98  BUN 10  --  9  --  8  --  <5* 7  CREATININE 0.96  --  0.81  --  0.75  --  0.81 0.87  CALCIUM 9.3  --  8.4*  --  8.8*  --  8.3* 8.9  MG  --   --   --   --   --   --   --  1.7  ALKPHOS 78  --  63  --  69  --  55 55  AST 54*  --  37  --  51*  --  25 20  ALT 41  --  32  --  33  --  24 23  ALBUMIN 3.8  --  3.2*  --  3.1*  --  2.5* 2.8*   < > = values in this interval not displayed.   EXAM: 02/04/20 CT HEAD WITHOUT CONTRAST COMPARISON:  MRI dated November 25, 2015. CT head dated September 21, 2015. FINDINGS: Brain: No evidence of acute infarction, hemorrhage, hydrocephalus, extra-axial collection or mass  lesion/mass effect. Vascular: No hyperdense vessel or unexpected calcification. Skull: Normal. Negative for fracture or focal lesion. Sinuses/Orbits: There is pansinus mucosal thickening. The mastoid air cells are essentially clear. Other: None. IMPRESSION: 1. No acute intracranial abnormality. 2. Pansinus mucosal thickening.  Results/Tests Pending at Time of Discharge: none  Discharge Medications:  Allergies as of 02/09/2020   No Known Allergies     Medication List    TAKE these medications   alendronate 70 MG tablet Commonly known as: FOSAMAX TAKE 1 TABLET BY MOUTH IN THE MORNING EVERY 7 DAYS What changed:   how much to take  how to take this  when to take this  additional instructions   azelastine 0.1 % nasal spray Commonly known as: ASTELIN Place 2 sprays into both nostrils 2 (two) times daily as needed for rhinitis.   dexamethasone 6 MG tablet Commonly known as: DECADRON Take 1 tablet (6 mg total) by mouth daily.   DULoxetine 60 MG capsule Commonly known as: CYMBALTA TAKE 1 CAPSULE BY MOUTH EVERY DAY What changed: how much to take   esomeprazole 40 MG capsule Commonly known as: NEXIUM TAKE 1 CAPSULE BY MOUTH TWICE A DAY What changed: when to take this   famotidine 40 MG tablet Commonly known as: PEPCID Take 40 mg by mouth 2 (two) times daily.   fluticasone 50 MCG/ACT nasal spray Commonly known as: FLONASE Place 1 spray into both nostrils daily.   ipratropium 0.03 % nasal spray Commonly known as: ATROVENT PLACE 2 SPRAYS INTO BOTH NOSTRILS EVERY 12 (TWELVE) HOURS.   Lastacaft 0.25 % Soln Generic drug: Alcaftadine Place 1 drop into both eyes 2 (two) times daily.   Linzess 145 MCG Caps capsule Generic drug: linaclotide TAKE ONE CAPSULE EVERY DAY ON EMPTY STOMACH AT LEAST 30MIN BEFORE 1ST MEAL OF THE DAY What changed: See the new instructions.   LORazepam 0.5 MG tablet Commonly known as: ATIVAN TAKE 1 TABLET (0.5 MG TOTAL) BY MOUTH 2 (TWO) TIMES  DAILY AS NEEDED FOR ANXIETY.   OXcarbazepine 150 MG tablet Commonly known as: TRILEPTAL Take 150 mg by mouth See admin instructions. Takes 300 mg  in the morning, 150 mg in afternoon and 300 mg at night.   predniSONE 5 MG tablet Commonly known as: DELTASONE Take 1 tablet (5 mg total) by mouth daily.   pregabalin 200 MG capsule Commonly known as: LYRICA TAKE 1 CAPSULE BY MOUTH THREE TIMES A DAY What changed: See the new instructions.   RITUXAN IV Inject 1,000 mg into the vein See admin instructions. Every 6 months.   rivaroxaban 10 MG  Tabs tablet Commonly known as: XARELTO Take 1 tablet (10 mg total) by mouth daily. With a meal   tiZANidine 4 MG tablet Commonly known as: ZANAFLEX Take 4 mg by mouth at bedtime as needed.   traMADol 50 MG tablet Commonly known as: ULTRAM TAKE 1 TABLET (50 MG TOTAL) BY MOUTH EVERY 6 (SIX) HOURS AS NEEDED. What changed: reasons to take this            Durable Medical Equipment  (From admission, onward)         Start     Ordered   02/07/20 1314  For home use only DME oxygen  Once    Question Answer Comment  Length of Need 6 Months   Mode or (Route) Nasal cannula   Liters per Minute 2   Frequency Continuous (stationary and portable oxygen unit needed)   Oxygen delivery system Gas      02/07/20 1313          Discharge Instructions: Please refer to Patient Instructions section of EMR for full details.  Patient was counseled important signs and symptoms that should prompt return to medical care, changes in medications, dietary instructions, activity restrictions, and follow up appointments.   Follow-Up Appointments: Follow-up Information    Post-COVID Care [facility] at [facility] Follow up.   Specialty: Family Medicine Why: Call to schedul a follow up appointment  Contact information: [place] [phone]       [facility] Follow up.   Why: home oxygen          Gifford Shave, MD 02/09/2020, 2:09 PM PGY-1, [place]

## 2020-02-10 ENCOUNTER — Telehealth: Payer: Self-pay

## 2020-02-10 NOTE — Telephone Encounter (Signed)
Andrea Berry on nurse line stating she went to pick up patients prescriptions post discharge and the pharmacist had some concerns. Pharmacist informed Andrea that the patient should not be on two steroids. Patient takes Prednisone daily, but was given Decadron yesterday at discharge. Pharmacist wanted to make sure MD is aware of this. Or if its better for the patient to stop Prednisone while on Decadron. Looks like, per discharge summary, patient is to stop Prednisone for 4 days while taking Decadron. Andrea has been updated.

## 2020-02-13 LAB — CULTURE, BLOOD (ROUTINE X 2)
Culture: NO GROWTH
Culture: NO GROWTH
Special Requests: ADEQUATE

## 2020-02-16 ENCOUNTER — Other Ambulatory Visit: Payer: Self-pay | Admitting: Family Medicine

## 2020-02-16 ENCOUNTER — Ambulatory Visit (INDEPENDENT_AMBULATORY_CARE_PROVIDER_SITE_OTHER): Payer: Medicare Other | Admitting: Nurse Practitioner

## 2020-02-16 ENCOUNTER — Other Ambulatory Visit: Payer: Self-pay

## 2020-02-16 VITALS — HR 117 | Resp 20

## 2020-02-16 DIAGNOSIS — Z8616 Personal history of COVID-19: Secondary | ICD-10-CM | POA: Diagnosis not present

## 2020-02-16 DIAGNOSIS — J9601 Acute respiratory failure with hypoxia: Secondary | ICD-10-CM

## 2020-02-16 DIAGNOSIS — R5381 Other malaise: Secondary | ICD-10-CM

## 2020-02-16 HISTORY — DX: Personal history of COVID-19: Z86.16

## 2020-02-16 HISTORY — DX: Acute respiratory failure with hypoxia: J96.01

## 2020-02-16 HISTORY — DX: Other malaise: R53.81

## 2020-02-16 NOTE — Patient Instructions (Addendum)
Covid-19 Acute respiratory failure with hypoxia:  Follow up with pulmonary at first available appointment  Continue Incentive spirometry and flutter valve device  Stay active  Will order home PT and home health nursing  Decadron complete  May restart prednisone as taking before hospital admission  Patient walked in office today O2 sats remained above 93% for entire, but patient became fatigued quickly and needed to sit back down -  May wean O2 to 1 L at rest and 2L with exertion   Elevated Liver enzymes:  Will recheck at next visit  Tachycardia:  Please follow - up with cardiology   Follow up with PCP as soon as possible  Follow up here in 1 month after seeing pulmonary - will need CBC and CMP rechecked at that time

## 2020-02-16 NOTE — Progress Notes (Signed)
@Patient  ID: Andrea Berry, female    DOB: 11/08/1965, 54 y.o.   MRN: [id number]  Chief Complaint  Patient presents with  . Hospitalization Follow-up    Referring provider: McDiarmid, Blane Ohara, MD   54 year old with history of SLE, chronic interstitial lung disease, pulmonary embolism on Xarelto, mild cognitive impairment, legally blind, steroid-induced osteopenia, migraines.  Patient was diagnosed with Covid on Feb 04, 2020.  HPI   Patient presents today for post Covid care clinic visit.  Patient was recently discharged from the hospital due to Covid with respiratory failure with hypoxia.  Patient was treated with oxygen, remdesivir for 5 days (527 through 531), and Decadron.  Patient did need oxygen in the hospital and was discharged home on 2 L of O2 continuously.  Patient states that since discharge she has been stable.  She does have family that lives with her.  She has been compliant with incentive spirometer and flutter valve device.  She is still currently on 2 L of O2 nasal cannula.  Patient did complete her course of Decadron and has switched back to prednisone as she was taking before she was hospitalized for ILD.  Patient has not followed up with pulmonary or PCP since hospital discharge.  She does complain today of still having a poor appetite.  She also feels very deconditioned and fatigued.  Patient does complain of shortness of breath and still has lingering cough.  She does state that she feels that she is slowly improving. Denies f/c/s, n/v/d, hemoptysis, PND, chest pain or edema.   Note: Patient walked in office today O2 sats remained above 93% for entire, but patient became fatigued quickly and needed to sit back down -  May wean O2 to 1 L at rest and 2L with exertion. O2 sats were stable during office visit with patient at rest on RA.      No Known Allergies  Immunization History  Administered Date(s) Administered  . Influenza,inj,Quad PF,6+ Mos 09/24/2015, 07/16/2018,  07/23/2019  . Influenza-Unspecified 07/10/2017  . Pneumococcal Conjugate-13 10/19/2019  . Pneumococcal Polysaccharide-23 07/23/2019  . Tdap 07/16/2018, 04/03/2019  . Zoster Recombinat (Shingrix) 10/13/2018, 04/03/2019    Past Medical History:  Diagnosis Date  . Anemia of chronic disease 10/28/2015  . Anxiety 07/12/2018  . Arthritis   . Ataxia 01/03/2016  . Blind   . Chronic central neuropathic pain 10/02/2018   Dx by [facility] 09/2017  . Chronic coughing 09/10/2016  . Chronic pain syndrome 07/12/2018  . Decreased strength 10/08/2018   Proximal leg muscles  . Depression   . Depression with anxiety 01/03/2016  . Devic's disease ([place])   . Dysesthesia 01/03/2016  . Dyspnea on exertion 11/07/2018  . Eustachian tube dysfunction 11/18/2018   L>R. Followed by [facility].  . Eustachian tube dysfunction, bilateral 09/18/2018  . GERD (gastroesophageal reflux disease)   . Headache(784.0)   . History of chicken pox 10/08/2018  . History of CVA (cerebrovascular accident)   . History of kidney stones   . Hypertension   . Hyponatremia 11/25/2015  . Impairment of balance 10/08/2018  . Legal blindness 01/30/2014  . Long-term corticosteroid use 10/08/2018  . Lupus ([place])   . Lupus anticoagulant disorder ([place]) 10/02/2018   Pulmonary Embolism 2017  . Lupus cerebritis ([place]) 11/25/2015   Possible explanation of 11/25/2015 acute hyperintensity findings on Brain MRI.   Marland Kitchen 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[place]) 01/03/2016  . Orthostatic dizziness, Recurrent 10/08/2018   Associated with medications  . Otalgia  07/12/2018  . Personal history of immunosupression therapy 10/08/2018   Chronic Azathioprine and prednisone therapy for Devic's disease  . Pigmented skin lesion of uncertain nature 8/75/6433  . Shortness of breath    due to medication  . Skin lesion of chest wall 11/07/2018  . SLE (systemic lupus erythematosus related syndrome) ([place]) 11/07/2018  . [place] antibody positive 04/07/2019  . Stroke [facility]) 2008    visually impaired  . Vitamin D deficiency 10/06/2018    Tobacco History: Social History   Tobacco Use  Smoking Status Never Smoker  Smokeless Tobacco Never Used   Counseling given: Yes   Outpatient Encounter Medications as of 02/16/2020  Medication Sig  . alendronate (FOSAMAX) 70 MG tablet TAKE 1 TABLET BY MOUTH IN THE MORNING EVERY 7 DAYS (Patient taking differently: Take 70 mg by mouth once a week. )  . azelastine (ASTELIN) 0.1 % nasal spray Place 2 sprays into both nostrils 2 (two) times daily as needed for rhinitis.  Marland Kitchen dexamethasone (DECADRON) 6 MG tablet Take 1 tablet (6 mg total) by mouth daily.  . DULoxetine (CYMBALTA) 60 MG capsule TAKE 1 CAPSULE BY MOUTH EVERY DAY (Patient taking differently: Take 60 mg by mouth daily. )  . esomeprazole (NEXIUM) 40 MG capsule TAKE 1 CAPSULE BY MOUTH TWICE A DAY (Patient taking differently: Take 40 mg by mouth 2 (two) times daily before a meal. )  . famotidine (PEPCID) 40 MG tablet Take 40 mg by mouth 2 (two) times daily.   . fluticasone (FLONASE) 50 MCG/ACT nasal spray Place 1 spray into both nostrils daily.   Marland Kitchen LASTACAFT 0.25 % SOLN Place 1 drop into both eyes 2 (two) times daily.   Marland Kitchen LINZESS 145 MCG CAPS capsule TAKE ONE CAPSULE EVERY DAY ON EMPTY STOMACH AT LEAST 30MIN BEFORE 1ST MEAL OF THE DAY (Patient taking differently: Take 145 mcg by mouth daily before breakfast. )  . LORazepam (ATIVAN) 0.5 MG tablet TAKE 1 TABLET (0.5 MG TOTAL) BY MOUTH 2 (TWO) TIMES DAILY AS NEEDED FOR ANXIETY.  . OXcarbazepine (TRILEPTAL) 150 MG tablet Take 150 mg by mouth See admin instructions. Takes 300 mg  in the morning, 150 mg in afternoon and 300 mg at night.  . predniSONE (DELTASONE) 5 MG tablet Take 1 tablet (5 mg total) by mouth daily.  . pregabalin (LYRICA) 200 MG capsule TAKE 1 CAPSULE BY MOUTH THREE TIMES A DAY (Patient taking differently: Take 200 mg by mouth in the morning, at noon, and at bedtime. )  . RiTUXimab (RITUXAN IV) Inject 1,000 mg into the vein  See admin instructions. Every 6 months.  . rivaroxaban (XARELTO) 10 MG TABS tablet Take 1 tablet (10 mg total) by mouth daily. With a meal  . tiZANidine (ZANAFLEX) 4 MG tablet Take 4 mg by mouth at bedtime as needed.  . traMADol (ULTRAM) 50 MG tablet TAKE 1 TABLET (50 MG TOTAL) BY MOUTH EVERY 6 (SIX) HOURS AS NEEDED. (Patient taking differently: Take 50 mg by mouth every 6 (six) hours as needed for moderate pain. )  . [DISCONTINUED] ipratropium (ATROVENT) 0.03 % nasal spray PLACE 2 SPRAYS INTO BOTH NOSTRILS EVERY 12 (TWELVE) HOURS.  . [DISCONTINUED] rivaroxaban (XARELTO) 20 MG TABS tablet Take 1 tablet (20 mg total) by mouth daily.   No facility-administered encounter medications on file as of 02/16/2020.     Review of Systems  Review of Systems  Constitutional: Positive for appetite change and fatigue. Negative for fever.  HENT: Negative.   Respiratory: Positive for cough  and shortness of breath. Negative for wheezing.   Cardiovascular: Negative.   Gastrointestinal: Negative.   Allergic/Immunologic: Negative.   Neurological: Negative.   Psychiatric/Behavioral: Negative.        Physical Exam  Pulse (!) 117   Resp 20   SpO2 94%   Wt Readings from Last 5 Encounters:  02/05/20 194 lb 10.7 oz (88.3 kg)  09/16/19 210 lb (95.3 kg)  08/01/19 212 lb (96.2 kg)  07/23/19 213 lb (96.6 kg)  01/07/19 189 lb (85.7 kg)     Physical Exam Vitals and nursing note reviewed.  Constitutional:      General: She is not in acute distress.    Appearance: She is well-developed.  Cardiovascular:     Rate and Rhythm: Regular rhythm. Tachycardia present.  Pulmonary:     Effort: Pulmonary effort is normal.     Breath sounds: Normal breath sounds.  Musculoskeletal:     Right lower leg: No edema.     Left lower leg: No edema.  Neurological:     Mental Status: She is alert and oriented to person, place, and time.     Motor: Weakness present.  Psychiatric:        Mood and Affect: Mood normal.         Behavior: Behavior normal.      Lab Results:  CBC    Component Value Date/Time   WBC 12.4 (H) 02/09/2020 0550   RBC 4.52 02/09/2020 0550   HGB 10.0 (L) 02/09/2020 0550   HGB 11.1 01/12/2019 1038   HCT 31.8 (L) 02/09/2020 0550   HCT 36.1 01/12/2019 1038   PLT 477 (H) 02/09/2020 0550   PLT 282 01/12/2019 1038   MCV 70.4 (L) 02/09/2020 0550   MCV 73 (L) 01/12/2019 1038   MCH 22.1 (L) 02/09/2020 0550   MCHC 31.4 02/09/2020 0550   RDW 15.5 02/09/2020 0550   RDW 15.9 (H) 01/12/2019 1038   LYMPHSABS 0.4 (L) 02/08/2020 1451   LYMPHSABS 1.8 01/12/2019 1038   MONOABS 0.2 02/08/2020 1451   EOSABS 0.0 02/08/2020 1451   EOSABS 0.1 01/12/2019 1038   BASOSABS 0.0 02/08/2020 1451   BASOSABS 0.0 01/12/2019 1038    BMET    Component Value Date/Time   NA 141 02/09/2020 0550   NA 144 07/16/2018 1256   K 3.3 (L) 02/09/2020 0550   CL 109 02/09/2020 0550   CO2 22 02/09/2020 0550   GLUCOSE 98 02/09/2020 0550   BUN 7 02/09/2020 0550   BUN 15 07/16/2018 1256   CREATININE 0.87 02/09/2020 0550   CALCIUM 8.9 02/09/2020 0550   GFRNONAA >60 02/09/2020 0550   GFRAA >60 02/09/2020 0550    BNP    Component Value Date/Time   BNP 24.0 11/06/2018 1205    ProBNP No results found for: PROBNP  Imaging: CT Head Wo Contrast  Result Date: 02/04/2020 CLINICAL DATA:  Acute pain due to trauma. EXAM: CT HEAD WITHOUT CONTRAST TECHNIQUE: Contiguous axial images were obtained from the base of the skull through the vertex without intravenous contrast. COMPARISON:  MRI dated November 25, 2015. CT head dated September 21, 2015. FINDINGS: Brain: No evidence of acute infarction, hemorrhage, hydrocephalus, extra-axial collection or mass lesion/mass effect. Vascular: No hyperdense vessel or unexpected calcification. Skull: Normal. Negative for fracture or focal lesion. Sinuses/Orbits: There is pansinus mucosal thickening. The mastoid air cells are essentially clear. Other: None. IMPRESSION: 1. No acute  intracranial abnormality. 2. Pansinus mucosal thickening. Electronically Signed   By: Jamie Kato.D.  On: 02/04/2020 17:52   DG Chest Port 1 View  Result Date: 02/04/2020 CLINICAL DATA:  COVID-19.  Cough.  Shortness of breath. EXAM: PORTABLE CHEST 1 VIEW COMPARISON:  11/06/2018 FINDINGS: The heart size and pulmonary vascularity are normal. Slight haziness at the left lung base laterally. Chronic scarring at the right lung base. No effusions. No bone abnormality of significance. IMPRESSION: Slight haziness at the left lung base laterally which could represent a small area of infiltrate. Scarring at the right lung base. Electronically Signed   By: Lorriane Shire M.D.   On: 02/04/2020 14:48     Assessment & Plan:   History of COVID-19 Covid-19 Acute respiratory failure with hypoxia:   - Slowly improving  Follow up with pulmonary at first available appointment  Continue Incentive spirometry and flutter valve device  Stay active  Will order home PT and home health nursing  Decadron complete  May restart prednisone as taking before hospital admission  Patient walked in office today O2 sats remained above 93% for entire, but patient became fatigued quickly and needed to sit back down -  May wean O2 to 1 L at rest and 2L with exertion   Elevated Liver enzymes:   - most likely due to decadron  Will recheck at next visit  Tachycardia:   - most likely associated with physical deconditioning   Please follow - up with cardiology   Follow up with PCP as soon as possible  Follow up here in 1 month after seeing pulmonary - will need CBC and CMP rechecked at that time       Fenton Foy, NP 02/16/2020

## 2020-02-16 NOTE — Assessment & Plan Note (Signed)
Covid-19 Acute respiratory failure with hypoxia:   - Slowly improving  Follow up with pulmonary at first available appointment  Continue Incentive spirometry and flutter valve device  Stay active  Will order home PT and home health nursing  Decadron complete  May restart prednisone as taking before hospital admission  Patient walked in office today O2 sats remained above 93% for entire, but patient became fatigued quickly and needed to sit back down -  May wean O2 to 1 L at rest and 2L with exertion   Elevated Liver enzymes:   - most likely due to decadron  Will recheck at next visit  Tachycardia:   - most likely associated with physical deconditioning   Please follow - up with cardiology   Follow up with PCP as soon as possible  Follow up here in 1 month after seeing pulmonary - will need CBC and CMP rechecked at that time

## 2020-02-21 ENCOUNTER — Emergency Department (HOSPITAL_COMMUNITY): Payer: Medicare Other

## 2020-02-21 ENCOUNTER — Inpatient Hospital Stay (HOSPITAL_COMMUNITY)
Admission: AD | Admit: 2020-02-21 | Discharge: 2020-03-04 | DRG: 871 | Disposition: A | Discharge status: Home / Self Care | Payer: Medicare Other | Attending: Family Medicine | Admitting: Family Medicine

## 2020-02-21 ENCOUNTER — Other Ambulatory Visit: Payer: Self-pay

## 2020-02-21 ENCOUNTER — Encounter (HOSPITAL_COMMUNITY): Payer: Self-pay | Admitting: Student

## 2020-02-21 DIAGNOSIS — Z888 Allergy status to other drugs, medicaments and biological substances status: Secondary | ICD-10-CM

## 2020-02-21 DIAGNOSIS — D638 Anemia in other chronic diseases classified elsewhere: Secondary | ICD-10-CM | POA: Diagnosis present

## 2020-02-21 DIAGNOSIS — M810 Age-related osteoporosis without current pathological fracture: Secondary | ICD-10-CM | POA: Diagnosis present

## 2020-02-21 DIAGNOSIS — G360 Neuromyelitis optica [Devic]: Secondary | ICD-10-CM | POA: Diagnosis present

## 2020-02-21 DIAGNOSIS — J849 Interstitial pulmonary disease, unspecified: Secondary | ICD-10-CM | POA: Diagnosis present

## 2020-02-21 DIAGNOSIS — E872 Acidosis: Secondary | ICD-10-CM | POA: Diagnosis present

## 2020-02-21 DIAGNOSIS — Z87442 Personal history of urinary calculi: Secondary | ICD-10-CM

## 2020-02-21 DIAGNOSIS — R7982 Elevated C-reactive protein (CRP): Secondary | ICD-10-CM | POA: Diagnosis present

## 2020-02-21 DIAGNOSIS — J9601 Acute respiratory failure with hypoxia: Secondary | ICD-10-CM

## 2020-02-21 DIAGNOSIS — J189 Pneumonia, unspecified organism: Secondary | ICD-10-CM

## 2020-02-21 DIAGNOSIS — Z79899 Other long term (current) drug therapy: Secondary | ICD-10-CM

## 2020-02-21 DIAGNOSIS — H548 Legal blindness, as defined in USA: Secondary | ICD-10-CM | POA: Diagnosis present

## 2020-02-21 DIAGNOSIS — G894 Chronic pain syndrome: Secondary | ICD-10-CM | POA: Diagnosis present

## 2020-02-21 DIAGNOSIS — I248 Other forms of acute ischemic heart disease: Secondary | ICD-10-CM | POA: Diagnosis present

## 2020-02-21 DIAGNOSIS — N179 Acute kidney failure, unspecified: Secondary | ICD-10-CM | POA: Diagnosis present

## 2020-02-21 DIAGNOSIS — A419 Sepsis, unspecified organism: Secondary | ICD-10-CM | POA: Diagnosis not present

## 2020-02-21 DIAGNOSIS — Z7901 Long term (current) use of anticoagulants: Secondary | ICD-10-CM

## 2020-02-21 DIAGNOSIS — R0682 Tachypnea, not elsewhere classified: Secondary | ICD-10-CM | POA: Diagnosis present

## 2020-02-21 DIAGNOSIS — G3184 Mild cognitive impairment, so stated: Secondary | ICD-10-CM | POA: Diagnosis present

## 2020-02-21 DIAGNOSIS — I351 Nonrheumatic aortic (valve) insufficiency: Secondary | ICD-10-CM | POA: Diagnosis present

## 2020-02-21 DIAGNOSIS — D509 Iron deficiency anemia, unspecified: Secondary | ICD-10-CM | POA: Diagnosis present

## 2020-02-21 DIAGNOSIS — R000 Tachycardia, unspecified: Secondary | ICD-10-CM | POA: Diagnosis present

## 2020-02-21 DIAGNOSIS — R7989 Other specified abnormal findings of blood chemistry: Secondary | ICD-10-CM

## 2020-02-21 DIAGNOSIS — G373 Acute transverse myelitis in demyelinating disease of central nervous system: Secondary | ICD-10-CM | POA: Diagnosis present

## 2020-02-21 DIAGNOSIS — Z7952 Long term (current) use of systemic steroids: Secondary | ICD-10-CM

## 2020-02-21 DIAGNOSIS — M3213 Lung involvement in systemic lupus erythematosus: Secondary | ICD-10-CM | POA: Diagnosis present

## 2020-02-21 DIAGNOSIS — Z8616 Personal history of COVID-19: Secondary | ICD-10-CM | POA: Diagnosis present

## 2020-02-21 DIAGNOSIS — M329 Systemic lupus erythematosus, unspecified: Secondary | ICD-10-CM | POA: Diagnosis present

## 2020-02-21 DIAGNOSIS — K5900 Constipation, unspecified: Secondary | ICD-10-CM | POA: Diagnosis present

## 2020-02-21 DIAGNOSIS — Z8673 Personal history of transient ischemic attack (TIA), and cerebral infarction without residual deficits: Secondary | ICD-10-CM

## 2020-02-21 DIAGNOSIS — M1990 Unspecified osteoarthritis, unspecified site: Secondary | ICD-10-CM | POA: Diagnosis present

## 2020-02-21 DIAGNOSIS — Z86711 Personal history of pulmonary embolism: Secondary | ICD-10-CM | POA: Diagnosis present

## 2020-02-21 DIAGNOSIS — R627 Adult failure to thrive: Secondary | ICD-10-CM | POA: Diagnosis not present

## 2020-02-21 DIAGNOSIS — I422 Other hypertrophic cardiomyopathy: Secondary | ICD-10-CM | POA: Diagnosis present

## 2020-02-21 DIAGNOSIS — J1108 Influenza due to unidentified influenza virus with specified pneumonia: Secondary | ICD-10-CM | POA: Diagnosis present

## 2020-02-21 DIAGNOSIS — J9811 Atelectasis: Secondary | ICD-10-CM | POA: Diagnosis present

## 2020-02-21 DIAGNOSIS — K219 Gastro-esophageal reflux disease without esophagitis: Secondary | ICD-10-CM | POA: Diagnosis present

## 2020-02-21 DIAGNOSIS — J45909 Unspecified asthma, uncomplicated: Secondary | ICD-10-CM | POA: Diagnosis present

## 2020-02-21 DIAGNOSIS — J9621 Acute and chronic respiratory failure with hypoxia: Secondary | ICD-10-CM | POA: Diagnosis present

## 2020-02-21 DIAGNOSIS — D6862 Lupus anticoagulant syndrome: Secondary | ICD-10-CM | POA: Diagnosis present

## 2020-02-21 DIAGNOSIS — F418 Other specified anxiety disorders: Secondary | ICD-10-CM | POA: Diagnosis present

## 2020-02-21 DIAGNOSIS — I119 Hypertensive heart disease without heart failure: Secondary | ICD-10-CM | POA: Diagnosis present

## 2020-02-21 DIAGNOSIS — Z789 Other specified health status: Secondary | ICD-10-CM

## 2020-02-21 DIAGNOSIS — G8929 Other chronic pain: Secondary | ICD-10-CM | POA: Diagnosis present

## 2020-02-21 DIAGNOSIS — J9611 Chronic respiratory failure with hypoxia: Secondary | ICD-10-CM

## 2020-02-21 DIAGNOSIS — Z832 Family history of diseases of the blood and blood-forming organs and certain disorders involving the immune mechanism: Secondary | ICD-10-CM

## 2020-02-21 DIAGNOSIS — T380X5A Adverse effect of glucocorticoids and synthetic analogues, initial encounter: Secondary | ICD-10-CM | POA: Diagnosis present

## 2020-02-21 DIAGNOSIS — Z79891 Long term (current) use of opiate analgesic: Secondary | ICD-10-CM

## 2020-02-21 DIAGNOSIS — G4700 Insomnia, unspecified: Secondary | ICD-10-CM | POA: Diagnosis present

## 2020-02-21 DIAGNOSIS — E559 Vitamin D deficiency, unspecified: Secondary | ICD-10-CM | POA: Diagnosis present

## 2020-02-21 DIAGNOSIS — U071 COVID-19: Secondary | ICD-10-CM

## 2020-02-21 DIAGNOSIS — R509 Fever, unspecified: Secondary | ICD-10-CM | POA: Diagnosis present

## 2020-02-21 DIAGNOSIS — F39 Unspecified mood [affective] disorder: Secondary | ICD-10-CM | POA: Diagnosis present

## 2020-02-21 DIAGNOSIS — J14 Pneumonia due to Hemophilus influenzae: Secondary | ICD-10-CM | POA: Diagnosis present

## 2020-02-21 HISTORY — DX: Pneumonia, unspecified organism: J18.9

## 2020-02-21 LAB — CBC WITH DIFFERENTIAL/PLATELET
Abs Immature Granulocytes: 0.12 10*3/uL — ABNORMAL HIGH (ref 0.00–0.07)
Basophils Absolute: 0 10*3/uL (ref 0.0–0.1)
Basophils Relative: 1 %
Eosinophils Absolute: 0 10*3/uL (ref 0.0–0.5)
Eosinophils Relative: 0 %
HCT: 35.9 % — ABNORMAL LOW (ref 36.0–46.0)
Hemoglobin: 11.1 g/dL — ABNORMAL LOW (ref 12.0–15.0)
Immature Granulocytes: 2 %
Lymphocytes Relative: 8 %
Lymphs Abs: 0.6 10*3/uL — ABNORMAL LOW (ref 0.7–4.0)
MCH: 21.6 pg — ABNORMAL LOW (ref 26.0–34.0)
MCHC: 30.9 g/dL (ref 30.0–36.0)
MCV: 69.7 fL — ABNORMAL LOW (ref 80.0–100.0)
Monocytes Absolute: 0.4 10*3/uL (ref 0.1–1.0)
Monocytes Relative: 6 %
Neutro Abs: 5.9 10*3/uL (ref 1.7–7.7)
Neutrophils Relative %: 83 %
Platelets: 142 10*3/uL — ABNORMAL LOW (ref 150–400)
RBC: 5.15 MIL/uL — ABNORMAL HIGH (ref 3.87–5.11)
RDW: 15.2 % (ref 11.5–15.5)
WBC Morphology: INCREASED
WBC: 7.1 10*3/uL (ref 4.0–10.5)
nRBC: 0 % (ref 0.0–0.2)

## 2020-02-21 LAB — PROTIME-INR
INR: 1.3 — ABNORMAL HIGH (ref 0.8–1.2)
Prothrombin Time: 15.6 seconds — ABNORMAL HIGH (ref 11.4–15.2)

## 2020-02-21 LAB — COMPREHENSIVE METABOLIC PANEL
ALT: 168 U/L — ABNORMAL HIGH (ref 0–44)
AST: 298 U/L — ABNORMAL HIGH (ref 15–41)
Albumin: 3 g/dL — ABNORMAL LOW (ref 3.5–5.0)
Alkaline Phosphatase: 77 U/L (ref 38–126)
Anion gap: 14 (ref 5–15)
BUN: 16 mg/dL (ref 6–20)
CO2: 14 mmol/L — ABNORMAL LOW (ref 22–32)
Calcium: 8.4 mg/dL — ABNORMAL LOW (ref 8.9–10.3)
Chloride: 105 mmol/L (ref 98–111)
Creatinine, Ser: 1.27 mg/dL — ABNORMAL HIGH (ref 0.44–1.00)
GFR calc Af Amer: 57 mL/min — ABNORMAL LOW (ref >60)
GFR calc non Af Amer: 49 mL/min — ABNORMAL LOW (ref >60)
Glucose, Bld: 153 mg/dL — ABNORMAL HIGH (ref 70–99)
Potassium: 4.1 mmol/L (ref 3.5–5.1)
Sodium: 133 mmol/L — ABNORMAL LOW (ref 135–145)
Total Bilirubin: 1.5 mg/dL — ABNORMAL HIGH (ref 0.3–1.2)
Total Protein: 6.5 g/dL (ref 6.5–8.1)

## 2020-02-21 LAB — I-STAT VENOUS BLOOD GAS, ED
Acid-base deficit: 4 mmol/L — ABNORMAL HIGH (ref 0.0–2.0)
Bicarbonate: 18.3 mmol/L — ABNORMAL LOW (ref 20.0–28.0)
Calcium, Ion: 1.04 mmol/L — ABNORMAL LOW (ref 1.15–1.40)
HCT: 31 % — ABNORMAL LOW (ref 36.0–46.0)
Hemoglobin: 10.5 g/dL — ABNORMAL LOW (ref 12.0–15.0)
O2 Saturation: 97 %
Potassium: 3.9 mmol/L (ref 3.5–5.1)
Sodium: 136 mmol/L (ref 135–145)
TCO2: 19 mmol/L — ABNORMAL LOW (ref 22–32)
pCO2, Ven: 24.1 mmHg — ABNORMAL LOW (ref 44.0–60.0)
pH, Ven: 7.489 — ABNORMAL HIGH (ref 7.250–7.430)
pO2, Ven: 81 mmHg — ABNORMAL HIGH (ref 32.0–45.0)

## 2020-02-21 LAB — I-STAT BETA HCG BLOOD, ED (MC, WL, AP ONLY): I-stat hCG, quantitative: <5 m[IU]/mL (ref <5)

## 2020-02-21 LAB — LACTIC ACID, PLASMA
Lactic Acid, Venous: 2.2 mmol/L (ref 0.5–1.9)
Lactic Acid, Venous: 2.8 mmol/L (ref 0.5–1.9)

## 2020-02-21 LAB — APTT: aPTT: 40 seconds — ABNORMAL HIGH (ref 24–36)

## 2020-02-21 MED ORDER — DEXAMETHASONE SODIUM PHOSPHATE 10 MG/ML IJ SOLN
6.0000 mg | INTRAMUSCULAR | Status: DC
  Administered 2020-02-21: 6 mg via INTRAVENOUS
  Filled 2020-02-21: qty 1

## 2020-02-21 MED ORDER — FLUTICASONE PROPIONATE 50 MCG/ACT NA SUSP: 1.0000 | Freq: Every day | NASAL | Status: DC | PRN

## 2020-02-21 MED ORDER — PREDNISONE 5 MG PO TABS: 5.0000 mg | ORAL_TABLET | Freq: Every day | ORAL | Status: DC

## 2020-02-21 MED ORDER — IPRATROPIUM BROMIDE 0.06 % NA SOLN
2.0000 | Freq: Two times a day (BID) | NASAL | Status: DC
  Administered 2020-02-22 – 2020-02-25 (×8): 2 via NASAL
  Filled 2020-02-21: qty 15

## 2020-02-21 MED ORDER — RIVAROXABAN 10 MG PO TABS
10.0000 mg | ORAL_TABLET | Freq: Every day | ORAL | Status: DC
  Administered 2020-02-22 – 2020-03-03 (×11): 10 mg via ORAL
  Filled 2020-02-21 (×12): qty 1

## 2020-02-21 MED ORDER — PANTOPRAZOLE SODIUM 40 MG PO TBEC
40.0000 mg | DELAYED_RELEASE_TABLET | Freq: Every day | ORAL | Status: DC
  Administered 2020-02-22 – 2020-03-03 (×11): 40 mg via ORAL
  Filled 2020-02-21 (×11): qty 1

## 2020-02-21 MED ORDER — ALENDRONATE SODIUM 70 MG PO TABS: 70.0000 mg | ORAL_TABLET | ORAL | Status: DC

## 2020-02-21 MED ORDER — FAMOTIDINE 20 MG PO TABS: 40.0000 mg | ORAL_TABLET | Freq: Every day | ORAL | Status: DC | PRN

## 2020-02-21 MED ORDER — PREGABALIN 100 MG PO CAPS
200.0000 mg | ORAL_CAPSULE | Freq: Three times a day (TID) | ORAL | Status: DC
  Administered 2020-02-21 – 2020-02-29 (×23): 200 mg via ORAL
  Filled 2020-02-21 (×5): qty 2
  Filled 2020-02-21: qty 8
  Filled 2020-02-21 (×10): qty 2
  Filled 2020-02-21 (×2): qty 8
  Filled 2020-02-21 (×5): qty 2

## 2020-02-21 MED ORDER — LINACLOTIDE 145 MCG PO CAPS
145.0000 ug | ORAL_CAPSULE | Freq: Every day | ORAL | Status: DC
  Administered 2020-02-22 – 2020-03-03 (×11): 145 ug via ORAL
  Filled 2020-02-21 (×11): qty 1

## 2020-02-21 MED ORDER — SODIUM CHLORIDE 0.9 % IV SOLN
2.0000 g | INTRAVENOUS | Status: DC
  Administered 2020-02-22 – 2020-02-24 (×3): 2 g via INTRAVENOUS
  Filled 2020-02-21 (×3): qty 20

## 2020-02-21 MED ORDER — DULOXETINE HCL 60 MG PO CPEP
60.0000 mg | ORAL_CAPSULE | Freq: Every day | ORAL | Status: DC
  Administered 2020-02-22 – 2020-03-03 (×11): 60 mg via ORAL
  Filled 2020-02-21 (×11): qty 1

## 2020-02-21 MED ORDER — VANCOMYCIN HCL 1750 MG/350ML IV SOLN
1750.0000 mg | Freq: Once | INTRAVENOUS | Status: AC
  Administered 2020-02-21: 1750 mg via INTRAVENOUS
  Filled 2020-02-21: qty 350

## 2020-02-21 MED ORDER — SODIUM CHLORIDE 0.9 % IV SOLN
500.0000 mg | INTRAVENOUS | Status: DC
  Administered 2020-02-22 – 2020-02-24 (×3): 500 mg via INTRAVENOUS
  Filled 2020-02-21 (×3): qty 500

## 2020-02-21 MED ORDER — SODIUM CHLORIDE 0.9 % IV BOLUS
1000.0000 mL | Freq: Once | INTRAVENOUS | Status: AC
  Administered 2020-02-21: 1000 mL via INTRAVENOUS

## 2020-02-21 MED ORDER — SODIUM CHLORIDE 0.9 % IV SOLN: INTRAVENOUS | Status: AC

## 2020-02-21 MED ORDER — KETOTIFEN FUMARATE 0.025 % OP SOLN
1.0000 [drp] | Freq: Two times a day (BID) | OPHTHALMIC | Status: DC
  Administered 2020-02-22 – 2020-03-03 (×23): 1 [drp] via OPHTHALMIC
  Filled 2020-02-21: qty 5

## 2020-02-21 MED ORDER — ACETAMINOPHEN 325 MG PO TABS
650.0000 mg | ORAL_TABLET | ORAL | Status: DC | PRN
  Administered 2020-02-21 – 2020-03-01 (×18): 650 mg via ORAL
  Filled 2020-02-21 (×19): qty 2

## 2020-02-21 MED ORDER — SODIUM CHLORIDE 0.9 % IV SOLN
2.0000 g | Freq: Once | INTRAVENOUS | Status: AC
  Administered 2020-02-21: 2 g via INTRAVENOUS
  Filled 2020-02-21: qty 2

## 2020-02-21 MED ORDER — ORAL CARE MOUTH RINSE
15.0000 mL | Freq: Two times a day (BID) | OROMUCOSAL | Status: DC
  Administered 2020-02-22 – 2020-03-03 (×21): 15 mL via OROMUCOSAL

## 2020-02-21 MED ORDER — TIZANIDINE HCL 4 MG PO TABS: 4.0000 mg | ORAL_TABLET | Freq: Every evening | ORAL | Status: DC | PRN

## 2020-02-21 NOTE — ED Triage Notes (Signed)
Pt arrives via EMS from home with c/o of failure to thrive X2 weeks. Upon EMS arrival heart rate in 140's. RR 60/min and 89% RA with thick yellow sputum being coughed up. Pt afebrile at 97.3  Pt went for follow up at PCP and reports negative covid test.   Pt arrived on 4liters [place] with minimal relief of SOB. 96%

## 2020-02-21 NOTE — H&P (Addendum)
[facility] Admission History and Physical Service Pager: [phone]  Patient name: Andrea Berry Medical record number: [id number] Date of birth: [birth year] Age: 54 y.o. Gender: female  Primary Care Provider: McDiarmid, Blane Ohara, MD Consultants: None Code Status: Full code  Chief Complaint: Shortness of breath  Assessment and Plan: Andrea Berry is a 54 y.o. female presenting with shortness of breath 2/2 recent Covid infection with hospitalization. PMH is significant for legal blindness, SLE, chronic interstitial lung disease,hx of PE on Xarelto, chronic central neuropathic pain, mild cognitive impairment, mood disorder, neuromyelitis optica, steroid-induced osteopenia, migraine without aura.  Acute respiratory distress 2/2 COVID-19 diagnosis, concern for sepsis Patient was diagnosed with COVID-19 on 02/04/2020 and hospitalized from 5/27-6/1.  She completed a course of remdesivir and Decadron.  She was stable and improved and discharged on 2L O2.  EMS was called today for shortness of breath with tachypnea.  When EMS arrived at her house the patient heart rate was 140s with respiratory rate ~ 60 breaths a minute.  Her O2 saturation on arrival was 89% on room air and the patient was coughing up thick yellow sputum.  Patient was afebrile when picked up by EMS with temperature of 97.3.  By arrival to the emergency department patient was on 4 L nasal cannula and was extremely tachypneic but oxygen saturation was 96%..  Patient was febrile on arrival at 103.2.  Sepsis protocol was initiated given tachycardia, tachypnea, fever.  Blood cultures were collected.  Urine culture and UA was ordered but not collected.  Chest x-ray was significant for multifocal pneumonia (viral or bacterial) with small pleural effusions right greater than left.  CBC significant for WBC 7.1, hemoglobin 11.1, hematocrit 35.9, MCV 69.7.  Lactic acid 2.2> 2.8.  On exam patient reported that her respiratory  status had greatly improved since arrival to the ED.  She was still tachypneic in the 30s but her oxygen saturation was 100%.  She was on high flow 15 L with mild increased work of breathing.  Given patient's recent Covid infection I feel this is most likely related to a superimposed posed bacterial infection after her Covid diagnosis.  Also on the differential is poor home O2 compliance where she became hypoxic in respiratory distress resulting in her presentation but given her fever and lactic acid this is less likely.  Pulmonary embolism should also be considered (Well's score 4.5 for prevoius PE) but less likely due to patient being on Xarelto 10mg  daily.  Patient was started on Lucianne Lei, Cefepime,adn Azithromycin in the ED.  -Admit to inpatient teaching service with Dr. Erin Hearing as attending\ - s/p Vanc, cefepime, and Azithromycin - obtain MRSA PCR - likely d/c Vanc. Narrow therapy to CTX and Azithromycin -If MRSA pcr positive consider continuation of vancomycin -Continuous cardiac monitoring -Continuous pulse ox -Oxygen therapy, wean as possible but keep O2 sats greater than 88% -Low threshold for further work-up of possible PE -Continue to trend lactic acid until downtrending -Restart Decadron per pharmacy -Morning CBC, CMP -Out of bed with assistance -PT/OT eval and treat -Given lactic acidosis and soft blood pressures will give 1 L normal saline bolus and start maintenance fluids at 100 mL/h  AKI Creatinine on arrival 1.27.  Creatinine at discharge on 6/1 was 0.87.  Most likely perfusion related. -Monitor vital signs -1 L bolus followed by maintenance fluids -Consider further work-up if worsening in the a.m. -Avoid nephrotoxic agents -Morning CMP's  Neuromyelitis optica Patient is legally blind Currently following with ophthalmology.  No acute changes in vision -Continue home ophthalmic solution  SLE Follows with [facility] rheumatology.  She was on hydroxychloroquine but is not  taking it per her ophthalmologist request.  Home medication includes prednisone 5 mg daily and she receives rituximab infusions every 6 months.  While hospitalized for Covid 2 weeks ago she received a course of Decadron.  History of PE PE occurred in 2017.  Home medications include Xarelto 10 mg daily.  Patient negative for antiphospholipid syndrome -Continue home Xarelto  History of transverse myelitis, Devic's disease Patient pain developed in 2017.  Chronic left upper and lower extremity weakness.  Home medications include tramadol, Neurontin, Lyrica, Trileptal. -Continue home schedule Trileptal and Lyrica -On tramadol as needed  Chronic interstitial lung disease Patient sees pulmonology as well as asthma and allergy.  PFT on 11/24/2018 unremarkable.  Per chart review from previous admission the patient had PFT performed December 2020 which they were unable to find records for.  Home medication includes Flonase nasal spray.  Insomnia Home medication includes Ativan 0.5 mg nightly. -Melatonin as needed  Prednisone-induced osteoporosis Patient is on alendronate diet at home, is supposed to be on 2-year holiday given longstanding use  Mood disorder Home medication includes Cymbalta 60 mg daily -Continue home Cymbalta  Hypertension Blood pressure since arrival have ranged from 82/42-121/56.  Patient was previously recommended to take Norvasc by cardiology but I does not appear that she is on any antihypertensive at this time. -Continue to monitor  Aortic regurgitation Patient had evidence on TEE with no evidence of structural changes.  No treatment necessary per cardiology.  History of CVA Patient had CVA in 2008.  Home medications include Xarelto -Continue home Xarelto, no antiplatelets  FEN/GI: Regular diet Prophylaxis: Lovenox  Disposition: Admit to progressive  History of Present Illness:  Andrea Berry is a 54 y.o. female presenting with shortness of breath.  Patient  reports that after her discharge from the hospital for Covid on 6/1 she felt like she improved some but over the past week her shortness of breath is increased.  She reports that she completed her Decadron dosage and has been using her home O2 at no more than 2 L.  Patient reports that a week ago she noticed she was having diarrhea and that her shortness of breath has increased over the last 7 days.  Denies any fever or chills, reports nausea starting yesterday with no vomiting.  Denies constipation denies abdominal pain, denies leg swelling.  She reported that she was trying to not have to come back to the hospital but has her shortness of breath progressed she finally decided she needed to call 911.  Patient reports that she feels better at this time although she did have a fever when she came into the hospital.  Review Of Systems: Per HPI with the following additions:   Review of Systems  Constitutional: Positive for fever. Negative for chills.  HENT: Positive for congestion and rhinorrhea. Negative for sore throat.   Respiratory: Positive for cough and shortness of breath. Negative for wheezing.   Cardiovascular: Negative for chest pain and leg swelling.  Gastrointestinal: Positive for diarrhea (For 1 week). Negative for abdominal distention, abdominal pain and constipation.  Endocrine: Negative for polydipsia.  Musculoskeletal: Negative for neck pain.  Neurological: Negative for headaches.     Patient Active Problem List   Diagnosis Date Noted  . Pneumonia 02/21/2020  . History of COVID-19 02/16/2020  . Physical deconditioning 02/16/2020  . Acute respiratory failure  with hypoxia ([place]) 02/16/2020  . Acute respiratory failure due to COVID-19 ([place]) 02/04/2020  . 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 ILD (interstitial lung disease) ([place]) 08/31/2019  . Cardiomegaly 08/31/2019  . High risk medication use 08/31/2019  . Elevated BP without diagnosis of hypertension 08/28/2019  .  Constipation 08/04/2019  . Lupus ([place]) 04/02/2019  . Migraine without aura 03/27/2019  . Steroid-induced osteopenia 03/23/2019  . Encounter for screening for diseases of the blood and blood-forming organs and certain disorders involving the immune mechanism 01/07/2019  . Chronic rhinitis 01/07/2019  . Current chronic use of systemic steroids 01/07/2019  . History of pulmonary embolism 12/10/2018  . Eustachian tube dysfunction, left 11/13/2018  . SLE (systemic lupus erythematosus related syndrome) ([place]) 11/07/2018  . Pigmented skin lesion of uncertain nature 62/13/0865  . Mild cognitive impairment 10/08/2018  . Personal history of immunosupression therapy 10/08/2018  . Long-term corticosteroid use 10/08/2018  . Chronic central neuropathic pain 10/02/2018  . Lupus anticoagulant disorder ([place]) 10/02/2018  . Anxiety 07/12/2018  . Chronic pain syndrome 07/12/2018  . Chronic coughing 09/10/2016  . 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[place]) 01/03/2016  . Ataxia 01/03/2016  . Dysesthesia 01/03/2016  . Anemia of chronic disease 10/28/2015  . Mood disorder ([place]) 09/21/2015  . Legal blindness 01/30/2014    Past Medical History: Past Medical History:  Diagnosis Date  . Anemia of chronic disease 10/28/2015  . Anxiety 07/12/2018  . Arthritis   . Ataxia 01/03/2016  . Blind   . Chronic central neuropathic pain 10/02/2018   [facility] 09/2017  . Chronic coughing 09/10/2016  . Chronic pain syndrome 07/12/2018  . Decreased strength 10/08/2018   Proximal leg muscles  . Depression   . Depression with anxiety 01/03/2016  . Devic's disease ([place])   . Dysesthesia 01/03/2016  . Dyspnea on exertion 11/07/2018  . Eustachian tube dysfunction 11/18/2018   L>R. Followed by [facility].  . Eustachian tube dysfunction, bilateral 09/18/2018  . GERD (gastroesophageal reflux disease)   . Headache(784.0)   . History of chicken pox 10/08/2018  . History of CVA (cerebrovascular accident)   . History of kidney  stones   . Hypertension   . Hyponatremia 11/25/2015  . Impairment of balance 10/08/2018  . Legal blindness 01/30/2014  . Long-term corticosteroid use 10/08/2018  . Lupus ([place])   . Lupus anticoagulant disorder ([place]) 10/02/2018   Pulmonary Embolism 2017  . Lupus cerebritis ([place]) 11/25/2015   Possible explanation of 11/25/2015 acute hyperintensity findings on Brain MRI.   Marland Kitchen 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[place]) 01/03/2016  . Orthostatic dizziness, Recurrent 10/08/2018   Associated with medications  . Otalgia 07/12/2018  . Personal history of immunosupression therapy 10/08/2018   Chronic Azathioprine and prednisone therapy for Devic's disease  . Pigmented skin lesion of uncertain nature 7/84/6962  . Shortness of breath    due to medication  . Skin lesion of chest wall 11/07/2018  . SLE (systemic lupus erythematosus related syndrome) ([place]) 11/07/2018  . Smith antibody positive 04/07/2019  . Stroke [facility]) 2008   visually impaired  . Vitamin D deficiency 10/06/2018    Past Surgical History: Past Surgical History:  Procedure Laterality Date  . BREAST BIOPSY    . BREAST CYST EXCISION Left pt unsure  . COLONOSCOPY WITH PROPOFOL N/A 07/26/2017   Procedure: COLONOSCOPY WITH PROPOFOL;  Surgeon: Carol Ada, MD;  Location: [facility] ENDOSCOPY;  Service: Endoscopy;  Laterality: N/A;  . ESOPHAGOGASTRODUODENOSCOPY (EGD) WITH PROPOFOL N/A 07/26/2017   Procedure: ESOPHAGOGASTRODUODENOSCOPY (EGD) WITH PROPOFOL;  Surgeon: Carol Ada, MD;  Location: [facility];  Service: Endoscopy;  Laterality: N/A;  . PORTA CATH INSERTION    . TUBAL LIGATION    . TUBAL LIGATION      Social History: Social History   Tobacco Use  . Smoking status: Never Smoker  . Smokeless tobacco: Never Used  Vaping Use  . Vaping Use: Never used  Substance Use Topics  . Alcohol use: No  . Drug use: No   Additional social history:   Please also refer to relevant sections of EMR.  Family History: Family History  Problem Relation Age  of Onset  . Cancer Mother   . Cancer Father   . Lupus Sister      Allergies and Medications: No Known Allergies No current facility-administered medications on file prior to encounter.   Current Outpatient Medications on File Prior to Encounter  Medication Sig Dispense Refill  . alendronate (FOSAMAX) 70 MG tablet TAKE 1 TABLET BY MOUTH IN THE MORNING EVERY 7 DAYS (Patient taking differently: Take 70 mg by mouth every Friday. ) 12 tablet 3  . azelastine (ASTELIN) 0.1 % nasal spray Place 2 sprays into both nostrils 2 (two) times daily as needed for rhinitis. 30 mL 5  . cyclobenzaprine (FLEXERIL) 10 MG tablet Take 10 mg by mouth daily as needed for muscle spasms.    . DULoxetine (CYMBALTA) 60 MG capsule TAKE 1 CAPSULE BY MOUTH EVERY DAY (Patient taking differently: Take 60 mg by mouth daily. ) 90 capsule 3  . esomeprazole (NEXIUM) 40 MG capsule TAKE 1 CAPSULE BY MOUTH TWICE A DAY (Patient taking differently: Take 40 mg by mouth 2 (two) times daily before a meal. ) 180 capsule 1  . famotidine (PEPCID) 40 MG tablet Take 40 mg by mouth daily as needed for heartburn or indigestion.     . fluticasone (FLONASE) 50 MCG/ACT nasal spray Place 1 spray into both nostrils daily as needed for allergies or rhinitis.     Marland Kitchen ipratropium (ATROVENT) 0.03 % nasal spray PLACE 2 SPRAYS INTO BOTH NOSTRILS EVERY 12 (TWELVE) HOURS. 30 mL 1  . LASTACAFT 0.25 % SOLN Place 1 drop into both eyes 2 (two) times daily.     Marland Kitchen LINZESS 145 MCG CAPS capsule TAKE ONE CAPSULE EVERY DAY ON EMPTY STOMACH AT LEAST 30MIN BEFORE 1ST MEAL OF THE DAY (Patient taking differently: Take 145 mcg by mouth daily before breakfast. ) 90 capsule 3  . LORazepam (ATIVAN) 0.5 MG tablet TAKE 1 TABLET (0.5 MG TOTAL) BY MOUTH 2 (TWO) TIMES DAILY AS NEEDED FOR ANXIETY. 30 tablet 5  . predniSONE (DELTASONE) 5 MG tablet Take 1 tablet (5 mg total) by mouth daily. 90 tablet 1  . pregabalin (LYRICA) 200 MG capsule TAKE 1 CAPSULE BY MOUTH THREE TIMES A DAY  (Patient taking differently: Take 200 mg by mouth in the morning, at noon, and at bedtime. ) 90 capsule 0  . RiTUXimab (RITUXAN IV) Inject 1,000 mg into the vein See admin instructions. 1,000 mg into the vein every 6 months    . rivaroxaban (XARELTO) 10 MG TABS tablet Take 1 tablet (10 mg total) by mouth daily. With a meal 90 tablet 1  . tiZANidine (ZANAFLEX) 4 MG tablet Take 4 mg by mouth at bedtime as needed for muscle spasms.     . traMADol (ULTRAM) 50 MG tablet TAKE 1 TABLET (50 MG TOTAL) BY MOUTH EVERY 6 (SIX) HOURS AS NEEDED. (Patient taking differently: Take 50 mg by mouth every 6 (  six) hours as needed for moderate pain. ) 60 tablet 1  . dexamethasone (DECADRON) 6 MG tablet Take 1 tablet (6 mg total) by mouth daily. (Patient not taking: Reported on 02/21/2020) 4 tablet 0  . OXcarbazepine (TRILEPTAL) 150 MG tablet Take 150 mg by mouth See admin instructions. Takes 300 mg  in the morning, 150 mg in afternoon and 300 mg at night. (Patient not taking: Reported on 02/21/2020)    . [DISCONTINUED] rivaroxaban (XARELTO) 20 MG TABS tablet Take 1 tablet (20 mg total) by mouth daily. 90 tablet 1    Objective: BP (!) 107/51   Pulse (!) 113   Temp 100.3 F (37.9 C) (Oral)   Resp (!) 40   Ht 5\' 1"  (1.549 m)   Wt 88.3 kg   SpO2 99%   BMI 36.78 kg/m  Physical Exam Constitutional:      General: She is in acute distress (On 15 L high flow).  HENT:     Head: Normocephalic and atraumatic.     Nose: Congestion and rhinorrhea present.     Mouth/Throat:     Mouth: Mucous membranes are dry.  Eyes:     Comments: Patient is legally blind  Cardiovascular:     Rate and Rhythm: Tachycardia present.     Pulses: Normal pulses.     Heart sounds: Normal heart sounds. No murmur heard.   Pulmonary:     Comments: Patient is tachypneic in the mid 30s on evaluation, work of breathing is mildly increased.  No wheezes noted but poor air movement.   Abdominal:     General: Bowel sounds are normal.      Palpations: Abdomen is soft.     Tenderness: There is no abdominal tenderness. There is no guarding.  Musculoskeletal:        General: No swelling.     Cervical back: Normal range of motion and neck supple. No rigidity or tenderness.     Right lower leg: No edema.     Left lower leg: No edema.  Lymphadenopathy:     Cervical: No cervical adenopathy.  Skin:    General: Skin is warm and dry.     Findings: No rash.  Neurological:     Mental Status: She is alert.     Labs and Imaging: CBC BMET  Recent Labs  Lab 02/21/20 1831 02/21/20 1831 02/21/20 1945  WBC 7.1  --   --   HGB 11.1*   < > 10.5*  HCT 35.9*   < > 31.0*  PLT 142*  --   --    < > = values in this interval not displayed.   Recent Labs  Lab 02/21/20 1831 02/21/20 1831 02/21/20 1945  NA 133*   < > 136  K 4.1   < > 3.9  CL 105  --   --   CO2 14*  --   --   BUN 16  --   --   CREATININE 1.27*  --   --   GLUCOSE 153*  --   --   CALCIUM 8.4*  --   --    < > = values in this interval not displayed.     EKG: Sinus tachycardia  PT/INR-15.6/1.3 APTT 40 Glucose 153 Blood cultures pending Lactic acid 2.2>2.8 Results for LAJADA, JANES (MRN [id number]) as of 02/21/2020 23:22  Ref. Range 02/21/2020 19:45  Sample type Unknown VENOUS  pH, Ven Latest Ref Range: 7.25 - 7.43  7.489 (H)  pCO2,  Ven Latest Ref Range: 44 - 60 mmHg 24.1 (L)  pO2, Ven Latest Ref Range: 32 - 45 mmHg 81.0 (H)  TCO2 Latest Ref Range: 22 - 32 mmol/L 19 (L)  Acid-base deficit Latest Ref Range: 0.0 - 2.0 mmol/L 4.0 (H)  Bicarbonate Latest Ref Range: 20.0 - 28.0 mmol/L 18.3 (L)  O2 Saturation Latest Units: % 97.0   DG Chest Port 1 View  Result Date: 02/21/2020 CLINICAL DATA:  Shortness of breath.  COVID positive. EXAM: PORTABLE CHEST 1 VIEW COMPARISON:  Feb 04, 2020 FINDINGS: There are hazy bilateral airspace opacities, greatest at the lung bases bilaterally. There is no pneumothorax. There are small bilateral pleural effusions, right greater than  left. The cardiac silhouette appears enlarged but stable from prior study. There is no acute osseous abnormality. IMPRESSION: 1. Multifocal pneumonia (viral or bacterial). 2. Small bilateral pleural effusions, right greater than left. Electronically Signed   By: Constance Holster M.D.   On: 02/21/2020 19:09    Gifford Shave, MD 02/21/2020, 8:31 PM PGY-1, [employer] Intern pager: [phone], text pages welcome  Upper Level Addendum: I have seen and evaluated this patient along with Dr. Caron Presume and reviewed the above note, making necessary revisions in green.   Mina Marble, D.O. 02/22/2020, 6:46 AM PGY-2, [place]

## 2020-02-21 NOTE — ED Notes (Signed)
RN only able to collet blue top blood culture for second set.

## 2020-02-21 NOTE — Progress Notes (Signed)
Patient informed. 

## 2020-02-21 NOTE — ED Provider Notes (Signed)
[facility] EMERGENCY DEPARTMENT Provider Note   CSN: [id number] Arrival date & time: 02/21/20  1810     History Chief Complaint  Patient presents with  . Code Sepsis    Andrea Berry is a 54 y.o. female.  Patient is a 54 year old female who was admitted to the hospital with Covid recently with 2 weeks of fatigue, worsening symptoms with EMS called to her house found to be hypoxic, tachypneic and tachycardic requiring nonrebreather mask transport to the hospital.  On arrival ABCs are intact.  Patient states she feels okay but has been tired for some time.  Denies chest pain or significant shortness of breath.  However vital signs are concerning for tachycardia and tachypnea as well as reported mild hypoxia that improved with supplemental oxygen.  Patient was discharged on as needed oxygen as well as 4 days of Decadron on 02/09/2020.  The history is provided by the patient, the EMS personnel and medical records.  Illness Location:  Respiratory Quality:  Tachypnea, tachycardia, fever, hypoxia Severity:  Moderate Onset quality:  Gradual Timing:  Constant Progression:  Worsening Chronicity:  Recurrent Context:  Recent COVID-19 diagnosis and hospitalization Relieved by:  Supplemental oxygen Worsened by:  Nothing Ineffective treatments:  None tried Associated symptoms: cough, fatigue, fever and shortness of breath   Associated symptoms: no abdominal pain, no chest pain, no loss of consciousness, no nausea and no vomiting        Past Medical History:  Diagnosis Date  . Anemia of chronic disease 10/28/2015  . Anxiety 07/12/2018  . Arthritis   . Ataxia 01/03/2016  . Blind   . Chronic central neuropathic pain 10/02/2018   [facility] 09/2017  . Chronic coughing 09/10/2016  . Chronic pain syndrome 07/12/2018  . Decreased strength 10/08/2018   Proximal leg muscles  . Depression   . Depression with anxiety 01/03/2016  . Devic's disease ([place])   . Dysesthesia  01/03/2016  . Dyspnea on exertion 11/07/2018  . Eustachian tube dysfunction 11/18/2018   L>R. Followed by [facility] ENT.  . Eustachian tube dysfunction, bilateral 09/18/2018  . GERD (gastroesophageal reflux disease)   . Headache(784.0)   . History of chicken pox 10/08/2018  . History of CVA (cerebrovascular accident)   . History of kidney stones   . Hypertension   . Hyponatremia 11/25/2015  . Impairment of balance 10/08/2018  . Legal blindness 01/30/2014  . Long-term corticosteroid use 10/08/2018  . Lupus ([place])   . Lupus anticoagulant disorder ([place]) 10/02/2018   Pulmonary Embolism 2017  . Lupus cerebritis ([place]) 11/25/2015   Possible explanation of 11/25/2015 acute hyperintensity findings on Brain MRI.   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 Orthostatic dizziness, Recurrent 10/08/2018   Associated with medications  . Otalgia 07/12/2018  . Personal history of immunosupression therapy 10/08/2018   Chronic Azathioprine and prednisone therapy for Devic's disease  . Pigmented skin lesion of uncertain nature 12/07/5186  . Shortness of breath    due to medication  . Skin lesion of chest wall 11/07/2018  . SLE (systemic lupus erythematosus related syndrome) ([place]) 11/07/2018  . Smith antibody positive 04/07/2019  . Stroke [facility]) 2008   visually impaired  . Vitamin D deficiency 10/06/2018    Patient Active Problem List   Diagnosis Date Noted  . Pneumonia 02/21/2020  . History of COVID-19 02/16/2020  . Physical deconditioning 02/16/2020  . Acute respiratory failure with hypoxia ([place]) 02/16/2020  . Acute respiratory failure due to COVID-19 [facility])  02/04/2020  . 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 ILD (interstitial lung disease) ([place]) 08/31/2019  . Cardiomegaly 08/31/2019  . High risk medication use 08/31/2019  . Elevated BP without diagnosis of hypertension 08/28/2019  . Constipation 08/04/2019  . Lupus ([place]) 04/02/2019  . Migraine without aura 03/27/2019  . Steroid-induced  osteopenia 03/23/2019  . Encounter for screening for diseases of the blood and blood-forming organs and certain disorders involving the immune mechanism 01/07/2019  . Chronic rhinitis 01/07/2019  . Current chronic use of systemic steroids 01/07/2019  . History of pulmonary embolism 12/10/2018  . Eustachian tube dysfunction, left 11/13/2018  . SLE (systemic lupus erythematosus related syndrome) ([place]) 11/07/2018  . Pigmented skin lesion of uncertain nature 19/50/9326  . Mild cognitive impairment 10/08/2018  . Personal history of immunosupression therapy 10/08/2018  . Long-term corticosteroid use 10/08/2018  . Chronic central neuropathic pain 10/02/2018  . Lupus anticoagulant disorder ([place]) 10/02/2018  . Anxiety 07/12/2018  . Chronic pain syndrome 07/12/2018  . Chronic coughing 09/10/2016  . 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 Ataxia 01/03/2016  . Dysesthesia 01/03/2016  . Anemia of chronic disease 10/28/2015  . Mood disorder ([place]) 09/21/2015  . Legal blindness 01/30/2014    Past Surgical History:  Procedure Laterality Date  . BREAST BIOPSY    . BREAST CYST EXCISION Left pt unsure  . COLONOSCOPY WITH PROPOFOL N/A 07/26/2017   Procedure: COLONOSCOPY WITH PROPOFOL;  Surgeon: Carol Ada, MD;  Location: [facility] ENDOSCOPY;  Service: Endoscopy;  Laterality: N/A;  . ESOPHAGOGASTRODUODENOSCOPY (EGD) WITH PROPOFOL N/A 07/26/2017   Procedure: ESOPHAGOGASTRODUODENOSCOPY (EGD) WITH PROPOFOL;  Surgeon: Carol Ada, MD;  Location: [facility];  Service: Endoscopy;  Laterality: N/A;  . PORTA CATH INSERTION    . TUBAL LIGATION    . TUBAL LIGATION       OB History    Gravida  3   Para  3   Term  3   Preterm      AB      Living  3     SAB      TAB      Ectopic      Multiple      Live Births  3           Family History  Problem Relation Age of Onset  . Cancer Mother   . Cancer Father   . Lupus Sister     Social History   Tobacco Use  . Smoking  status: Never Smoker  . Smokeless tobacco: Never Used  Vaping Use  . Vaping Use: Never used  Substance Use Topics  . Alcohol use: No  . Drug use: No    Home Medications Prior to Admission medications   Medication Sig Start Date End Date Taking? Authorizing Provider  alendronate (FOSAMAX) 70 MG tablet TAKE 1 TABLET BY MOUTH IN THE MORNING EVERY 7 DAYS Patient taking differently: Take 70 mg by mouth every Friday.  01/14/20  Yes McDiarmid, Blane Ohara, MD  azelastine (ASTELIN) 0.1 % nasal spray Place 2 sprays into both nostrils 2 (two) times daily as needed for rhinitis. 09/16/19  Yes Garnet Sierras, DO  cyclobenzaprine (FLEXERIL) 10 MG tablet Take 10 mg by mouth daily as needed for muscle spasms.   Yes [provider]  DULoxetine (CYMBALTA) 60 MG capsule TAKE 1 CAPSULE BY MOUTH EVERY DAY Patient taking differently: Take 60 mg by mouth daily.  10/30/19  Yes McDiarmid, Blane Ohara, MD  esomeprazole (NEXIUM) 40 MG capsule TAKE 1  CAPSULE BY MOUTH TWICE A DAY Patient taking differently: Take 40 mg by mouth 2 (two) times daily before a meal.  12/08/18  Yes Minette Brine, FNP  famotidine (PEPCID) 40 MG tablet Take 40 mg by mouth daily as needed for heartburn or indigestion.  08/31/19  Yes [provider]  fluticasone (FLONASE) 50 MCG/ACT nasal spray Place 1 spray into both nostrils daily as needed for allergies or rhinitis.  09/18/18  Yes [provider]  ipratropium (ATROVENT) 0.03 % nasal spray PLACE 2 SPRAYS INTO BOTH NOSTRILS EVERY 12 (TWELVE) HOURS. 02/16/20  Yes McDiarmid, Blane Ohara, MD  LASTACAFT 0.25 % SOLN Place 1 drop into both eyes 2 (two) times daily.  01/19/20  Yes [provider]  LINZESS 145 MCG CAPS capsule TAKE ONE CAPSULE EVERY DAY ON EMPTY STOMACH AT [place] OF THE DAY Patient taking differently: Take 145 mcg by mouth daily before breakfast.  01/26/20  Yes McDiarmid, Blane Ohara, MD  LORazepam (ATIVAN) 0.5 MG tablet TAKE 1 TABLET (0.5 MG TOTAL) BY MOUTH 2  (TWO) TIMES DAILY AS NEEDED FOR ANXIETY. 12/09/19  Yes McDiarmid, Blane Ohara, MD  predniSONE (DELTASONE) 5 MG tablet Take 1 tablet (5 mg total) by mouth daily. 08/28/19  Yes McDiarmid, Blane Ohara, MD  pregabalin (LYRICA) 200 MG capsule TAKE 1 CAPSULE BY MOUTH THREE TIMES A DAY Patient taking differently: Take 200 mg by mouth in the morning, at noon, and at bedtime.  07/11/18  Yes Minette Brine, FNP  RiTUXimab (RITUXAN IV) Inject 1,000 mg into the vein See admin instructions. 1,000 mg into the vein every 6 months   Yes [provider]  rivaroxaban (XARELTO) 10 MG TABS tablet Take 1 tablet (10 mg total) by mouth daily. With a meal 01/14/20  Yes McDiarmid, Blane Ohara, MD  tiZANidine (ZANAFLEX) 4 MG tablet Take 4 mg by mouth at bedtime as needed for muscle spasms.  01/18/20  Yes [provider]  traMADol (ULTRAM) 50 MG tablet TAKE 1 TABLET (50 MG TOTAL) BY MOUTH EVERY 6 (SIX) HOURS AS NEEDED. Patient taking differently: Take 50 mg by mouth every 6 (six) hours as needed for moderate pain.  11/02/19  Yes McDiarmid, Blane Ohara, MD  dexamethasone (DECADRON) 6 MG tablet Take 1 tablet (6 mg total) by mouth daily. Patient not taking: Reported on 02/21/2020 02/09/20   Gifford Shave, MD  OXcarbazepine (TRILEPTAL) 150 MG tablet Take 150 mg by mouth See admin instructions. Takes 300 mg  in the morning, 150 mg in afternoon and 300 mg at night. Patient not taking: Reported on 02/21/2020    [provider]  rivaroxaban (XARELTO) 20 MG TABS tablet Take 1 tablet (20 mg total) by mouth daily. 12/10/18   McDiarmid, Blane Ohara, MD    Allergies    Patient has no known allergies.  Review of Systems   Review of Systems  Constitutional: Positive for activity change, appetite change, fatigue and fever.  Respiratory: Positive for cough and shortness of breath.   Cardiovascular: Negative for chest pain.  Gastrointestinal: Negative for abdominal pain, nausea and vomiting.  Neurological: Negative for loss of consciousness.    All other systems reviewed and are negative.   Physical Exam Updated Vital Signs BP (!) 103/52 (BP Location: Right Arm)   Pulse (!) 110   Temp 99.9 F (37.7 C) (Oral)   Resp (!) 37   Ht 5\' 1"  (1.549 m)   Wt 88.3 kg   SpO2 99%   BMI 36.78 kg/m  Physical Exam Vitals and nursing note reviewed.  Constitutional:      General: She is not in acute distress.    Appearance: She is well-developed. She is ill-appearing.  HENT:     Head: Normocephalic and atraumatic.     Right Ear: External ear normal.     Left Ear: External ear normal.     Mouth/Throat:     Mouth: Mucous membranes are moist.  Cardiovascular:     Rate and Rhythm: Regular rhythm.     Heart sounds: No murmur heard.   Pulmonary:     Effort: Tachypnea and respiratory distress present.     Breath sounds: Normal breath sounds.  Abdominal:     General: There is no distension.     Palpations: Abdomen is soft.     Tenderness: There is no abdominal tenderness.  Musculoskeletal:        General: Normal range of motion.     Cervical back: Normal range of motion and neck supple.  Skin:    General: Skin is warm and dry.  Neurological:     General: No focal deficit present.     Mental Status: She is alert and oriented to person, place, and time.  Psychiatric:        Mood and Affect: Mood normal.        Behavior: Behavior normal.     ED Results / Procedures / Treatments   Labs (all labs ordered are listed, but only abnormal results are displayed) Labs Reviewed  LACTIC ACID, PLASMA - Abnormal; Notable for the following components:      Result Value   Lactic Acid, Venous 2.2 (*)    All other components within normal limits  LACTIC ACID, PLASMA - Abnormal; Notable for the following components:   Lactic Acid, Venous 2.8 (*)    All other components within normal limits  COMPREHENSIVE METABOLIC PANEL - Abnormal; Notable for the following components:   Sodium 133 (*)    CO2 14 (*)    Glucose, Bld 153 (*)     Creatinine, Ser 1.27 (*)    Calcium 8.4 (*)    Albumin 3.0 (*)    AST 298 (*)    ALT 168 (*)    Total Bilirubin 1.5 (*)    GFR calc non Af Amer 49 (*)    GFR calc Af Amer 57 (*)    All other components within normal limits  CBC WITH DIFFERENTIAL/PLATELET - Abnormal; Notable for the following components:   RBC 5.15 (*)    Hemoglobin 11.1 (*)    HCT 35.9 (*)    MCV 69.7 (*)    MCH 21.6 (*)    Platelets 142 (*)    Lymphs Abs 0.6 (*)    Abs Immature Granulocytes 0.12 (*)    All other components within normal limits  APTT - Abnormal; Notable for the following components:   aPTT 40 (*)    All other components within normal limits  PROTIME-INR - Abnormal; Notable for the following components:   Prothrombin Time 15.6 (*)    INR 1.3 (*)    All other components within normal limits  LACTIC ACID, PLASMA - Abnormal; Notable for the following components:   Lactic Acid, Venous 2.5 (*)    All other components within normal limits  I-STAT VENOUS BLOOD GAS, ED - Abnormal; Notable for the following components:   pH, Ven 7.489 (*)    pCO2, Ven 24.1 (*)    pO2, Ven 81.0 (*)  Bicarbonate 18.3 (*)    TCO2 19 (*)    Acid-base deficit 4.0 (*)    Calcium, Ion 1.04 (*)    HCT 31.0 (*)    Hemoglobin 10.5 (*)    All other components within normal limits  CULTURE, BLOOD (ROUTINE X 2)    EKG EKG Interpretation  Date/Time:  Sunday February 21 2020 18:26:02 EDT Ventricular Rate:  139 PR Interval:    QRS Duration: 115 QT Interval:  305 QTC Calculation: 464 R Axis:   74 Text Interpretation: Sinus tachycardia Probable left ventricular hypertrophy Nonspecific T abnormalities, diffuse leads T wave inversion improved since prior study Confirmed by Blanchie Dessert ([id number]) on 02/21/2020 7:06:48 PM   Radiology DG Chest Port 1 View  Result Date: 02/21/2020 CLINICAL DATA:  Shortness of breath.  COVID positive. EXAM: PORTABLE CHEST 1 VIEW COMPARISON:  Feb 04, 2020 FINDINGS: There are hazy bilateral  airspace opacities, greatest at the lung bases bilaterally. There is no pneumothorax. There are small bilateral pleural effusions, right greater than left. The cardiac silhouette appears enlarged but stable from prior study. There is no acute osseous abnormality. IMPRESSION: 1. Multifocal pneumonia (viral or bacterial). 2. Small bilateral pleural effusions, right greater than left. Electronically Signed   By: Constance Holster M.D.   On: 02/21/2020 19:09    Procedures Procedures (including critical care time)  Medications Ordered in ED Medications  acetaminophen (TYLENOL) tablet 650 mg (650 mg Oral Given 02/21/20 1835)  vancomycin (VANCOREADY) IVPB 1750 mg/350 mL (has no administration in time range)  ceFEPIme (MAXIPIME) 2 g in sodium chloride 0.9 % 100 mL IVPB (has no administration in time range)    ED Course  I have reviewed the triage vital signs and the nursing notes.  Pertinent labs & imaging results that were available during my care of the patient were reviewed by me and considered in my medical decision making (see chart for details).  Clinical Course as of Feb 21 1955  Sun Feb 21, 2020  1942 Patient reassessed, patient still has tachypnea and tachycardia but somewhat improved.  Patient denies any shortness of breath or chest pain at this time states she feels somewhat better.  Broad-spectrum antibiotics given for likely pneumonia on chest x-ray.   [AL]    Clinical Course User Index [AL] Delma Post, MD   MDM Rules/Calculators/A&P                          Differential diagnosis: Sepsis, COVID-19, post viral pneumonia, hypoxia, respiratory failure  ED physician interpretation of imaging: Chest x-ray with multifocal pneumonia, no hemopneumothorax. ED physician interpretation of EKG: No STEMI.  Sinus tachycardia ED physician interpretation of labs: Respiratory alkalosis on VBG.  Lactic acidosis.  Consistent with respiratory distress and sepsis  MDM: Patient is a 54 year old  female with a complex medical history presenting to the ED with hypoxia, tachycardia, tachypnea and fever consistent with sepsis from likely pulmonary source requiring IV antibiotics, supplemental oxygen and hospital admission for further treatment and evaluation.  Patient's vital signs are stable however patient is tachycardic, tachypneic and hypoxic without submental oxygen.  Patient is febrile.  Patient stabilized with interventions and inpatient service contacted for admission.  Patient admitted without incident.  Diagnosis, treatment and plan of care was discussed and agreed upon with patient.  Patient comfortable with admission at this time.   Final Clinical Impression(s) / ED Diagnoses Final diagnoses:  Acute respiratory failure with hypoxia (HCC)  COVID-19  Fever,  unspecified  Pneumonia of both lungs due to infectious organism, unspecified part of lung    Rx / DC Orders ED Discharge Orders    None       Delma Post, MD 02/22/20 Areta Haber    Blanchie Dessert, MD 02/22/20 2201

## 2020-02-22 DIAGNOSIS — F39 Unspecified mood [affective] disorder: Secondary | ICD-10-CM | POA: Diagnosis present

## 2020-02-22 DIAGNOSIS — E872 Acidosis: Secondary | ICD-10-CM | POA: Diagnosis present

## 2020-02-22 DIAGNOSIS — M810 Age-related osteoporosis without current pathological fracture: Secondary | ICD-10-CM | POA: Diagnosis present

## 2020-02-22 DIAGNOSIS — G8929 Other chronic pain: Secondary | ICD-10-CM | POA: Diagnosis not present

## 2020-02-22 DIAGNOSIS — U071 COVID-19: Secondary | ICD-10-CM | POA: Diagnosis not present

## 2020-02-22 DIAGNOSIS — G3184 Mild cognitive impairment, so stated: Secondary | ICD-10-CM | POA: Diagnosis present

## 2020-02-22 DIAGNOSIS — M792 Neuralgia and neuritis, unspecified: Secondary | ICD-10-CM | POA: Diagnosis not present

## 2020-02-22 DIAGNOSIS — Z7901 Long term (current) use of anticoagulants: Secondary | ICD-10-CM | POA: Diagnosis not present

## 2020-02-22 DIAGNOSIS — J9621 Acute and chronic respiratory failure with hypoxia: Secondary | ICD-10-CM | POA: Diagnosis present

## 2020-02-22 DIAGNOSIS — R509 Fever, unspecified: Secondary | ICD-10-CM | POA: Diagnosis not present

## 2020-02-22 DIAGNOSIS — J14 Pneumonia due to Hemophilus influenzae: Secondary | ICD-10-CM | POA: Diagnosis present

## 2020-02-22 DIAGNOSIS — J849 Interstitial pulmonary disease, unspecified: Secondary | ICD-10-CM | POA: Diagnosis not present

## 2020-02-22 DIAGNOSIS — J45909 Unspecified asthma, uncomplicated: Secondary | ICD-10-CM | POA: Diagnosis present

## 2020-02-22 DIAGNOSIS — G373 Acute transverse myelitis in demyelinating disease of central nervous system: Secondary | ICD-10-CM | POA: Diagnosis present

## 2020-02-22 DIAGNOSIS — M329 Systemic lupus erythematosus, unspecified: Secondary | ICD-10-CM | POA: Diagnosis present

## 2020-02-22 DIAGNOSIS — J9601 Acute respiratory failure with hypoxia: Secondary | ICD-10-CM | POA: Diagnosis not present

## 2020-02-22 DIAGNOSIS — D509 Iron deficiency anemia, unspecified: Secondary | ICD-10-CM | POA: Diagnosis present

## 2020-02-22 DIAGNOSIS — G4700 Insomnia, unspecified: Secondary | ICD-10-CM | POA: Diagnosis present

## 2020-02-22 DIAGNOSIS — A419 Sepsis, unspecified organism: Secondary | ICD-10-CM | POA: Diagnosis present

## 2020-02-22 DIAGNOSIS — J189 Pneumonia, unspecified organism: Secondary | ICD-10-CM | POA: Diagnosis not present

## 2020-02-22 DIAGNOSIS — H548 Legal blindness, as defined in USA: Secondary | ICD-10-CM | POA: Diagnosis present

## 2020-02-22 DIAGNOSIS — J9811 Atelectasis: Secondary | ICD-10-CM | POA: Diagnosis present

## 2020-02-22 DIAGNOSIS — J9611 Chronic respiratory failure with hypoxia: Secondary | ICD-10-CM | POA: Diagnosis not present

## 2020-02-22 DIAGNOSIS — I422 Other hypertrophic cardiomyopathy: Secondary | ICD-10-CM | POA: Diagnosis present

## 2020-02-22 DIAGNOSIS — I248 Other forms of acute ischemic heart disease: Secondary | ICD-10-CM | POA: Diagnosis present

## 2020-02-22 DIAGNOSIS — Z86711 Personal history of pulmonary embolism: Secondary | ICD-10-CM | POA: Diagnosis not present

## 2020-02-22 DIAGNOSIS — R627 Adult failure to thrive: Secondary | ICD-10-CM | POA: Diagnosis present

## 2020-02-22 DIAGNOSIS — T380X5A Adverse effect of glucocorticoids and synthetic analogues, initial encounter: Secondary | ICD-10-CM | POA: Diagnosis present

## 2020-02-22 DIAGNOSIS — G360 Neuromyelitis optica [Devic]: Secondary | ICD-10-CM | POA: Diagnosis present

## 2020-02-22 DIAGNOSIS — Z8616 Personal history of COVID-19: Secondary | ICD-10-CM | POA: Diagnosis not present

## 2020-02-22 DIAGNOSIS — J1108 Influenza due to unidentified influenza virus with specified pneumonia: Secondary | ICD-10-CM | POA: Diagnosis present

## 2020-02-22 DIAGNOSIS — N179 Acute kidney failure, unspecified: Secondary | ICD-10-CM | POA: Diagnosis present

## 2020-02-22 DIAGNOSIS — D6862 Lupus anticoagulant syndrome: Secondary | ICD-10-CM | POA: Diagnosis present

## 2020-02-22 DIAGNOSIS — R9431 Abnormal electrocardiogram [ECG] [EKG]: Secondary | ICD-10-CM | POA: Diagnosis not present

## 2020-02-22 DIAGNOSIS — Z888 Allergy status to other drugs, medicaments and biological substances status: Secondary | ICD-10-CM | POA: Diagnosis not present

## 2020-02-22 LAB — MRSA PCR SCREENING: MRSA by PCR: NEGATIVE

## 2020-02-22 LAB — CBC
HCT: 30.5 % — ABNORMAL LOW (ref 36.0–46.0)
Hemoglobin: 9.4 g/dL — ABNORMAL LOW (ref 12.0–15.0)
MCH: 21.6 pg — ABNORMAL LOW (ref 26.0–34.0)
MCHC: 30.8 g/dL (ref 30.0–36.0)
MCV: 70 fL — ABNORMAL LOW (ref 80.0–100.0)
Platelets: 103 10*3/uL — ABNORMAL LOW (ref 150–400)
RBC: 4.36 MIL/uL (ref 3.87–5.11)
RDW: 15.2 % (ref 11.5–15.5)
WBC: 5.6 10*3/uL (ref 4.0–10.5)
nRBC: 0 % (ref 0.0–0.2)

## 2020-02-22 LAB — BASIC METABOLIC PANEL
Anion gap: 8 (ref 5–15)
BUN: 13 mg/dL (ref 6–20)
CO2: 16 mmol/L — ABNORMAL LOW (ref 22–32)
Calcium: 7.3 mg/dL — ABNORMAL LOW (ref 8.9–10.3)
Chloride: 115 mmol/L — ABNORMAL HIGH (ref 98–111)
Creatinine, Ser: 0.98 mg/dL (ref 0.44–1.00)
GFR calc Af Amer: >60 mL/min (ref >60)
GFR calc non Af Amer: >60 mL/min (ref >60)
Glucose, Bld: 124 mg/dL — ABNORMAL HIGH (ref 70–99)
Potassium: 4.7 mmol/L (ref 3.5–5.1)
Sodium: 139 mmol/L (ref 135–145)

## 2020-02-22 LAB — URINALYSIS, ROUTINE W REFLEX MICROSCOPIC
Bacteria, UA: NONE SEEN
Bilirubin Urine: NEGATIVE
Glucose, UA: NEGATIVE mg/dL
Ketones, ur: 5 mg/dL — AB
Leukocytes,Ua: NEGATIVE
Nitrite: NEGATIVE
Protein, ur: 100 mg/dL — AB
Specific Gravity, Urine: 1.017 (ref 1.005–1.030)
pH: 6 (ref 5.0–8.0)

## 2020-02-22 LAB — EXPECTORATED SPUTUM ASSESSMENT W GRAM STAIN, RFLX TO RESP C

## 2020-02-22 LAB — PROCALCITONIN
Procalcitonin: 8.03 ng/mL
Procalcitonin: 17.56 ng/mL

## 2020-02-22 LAB — LACTIC ACID, PLASMA
Lactic Acid, Venous: 2.5 mmol/L (ref 0.5–1.9)
Lactic Acid, Venous: 1.5 mmol/L (ref 0.5–1.9)

## 2020-02-22 MED ORDER — SODIUM CHLORIDE 0.9 % IV BOLUS
1000.0000 mL | Freq: Once | INTRAVENOUS | Status: AC
  Administered 2020-02-22: 1000 mL via INTRAVENOUS

## 2020-02-22 MED ORDER — PREDNISONE 5 MG PO TABS
5.0000 mg | ORAL_TABLET | Freq: Every day | ORAL | Status: DC
  Administered 2020-02-23 – 2020-03-03 (×10): 5 mg via ORAL
  Filled 2020-02-22 (×10): qty 1

## 2020-02-22 NOTE — Progress Notes (Signed)
Family Medicine Teaching Service Daily Progress Note Intern Pager: [phone]  Patient name: Andrea Berry Medical record number: [id number] Date of birth: 12-13-1965 Age: 54 y.o. Gender: female  Primary Care Provider: McDiarmid, Blane Ohara, MD Consultants: None Code Status: Full  Pt Overview and Major Events to Date:  6/13 - admitted  Assessment and Plan: Andrea Berry is a 54 y.o. female presenting with shortness of breath 2/2 recent Covid infection with hospitalization. PMH is significant for legal blindness, SLE, chronic interstitial lung disease,hx of PE on Xarelto, chronic central neuropathic pain, mild cognitive impairment, mood disorder, neuromyelitis optica, steroid-induced osteopenia, migraine without aura.  Acute respiratory distress 2/2 COVID-19 diagnosis, concern for sepsis Diagnosed with COVID-19 on 5/27.  Status post remdesivir and Decadron.  She was stable and improved and discharged.  EMS was called 6/13 for shortness of breath with tachypnea.  When EMS arrived at her house the patient heart rate was 140s with respiratory rate approximately 60 breaths a minute.  Her O2 saturation on arrival was 89% on room air and the patient was coughing up thick yellow sputum.  Patient was afebrile when picked up by EM with temperature of 97.3.    In the emergency department patient required 4 L with O2 sat at 96% and tachypnea present.  Patient was febrile on arrival at 103.2.  Sepsis protocol was initiated given tachycardia, tachypnea, fever.   Chest x-ray was significant for multifocal pneumonia (viral or bacterial) with small pleural effusions right greater than left.  CBC significant for WBC 7.1>5.6, hemoglobin 11.1>9.4, hematocrit 35.9>30.5, MCV 69.7.  Lactic acid 2.2> 2.8>1.5.   Procalcitonin 17.5, suggestive of bacterial infection. Her Wells score is 4.5 given her previous PE.  This is less likely due to the fact that the patient is currently on Xarelto 10 mg daily.  Patient sat 99% on high  flow nasal cannula 7 L/min. -Continuous cardiac monitoring -Continuous pulse ox -Oxygen therapy, wean as possible but keep O2 sats greater than 88% -Discontinue Decadron -Narrow therapy to ceftriaxone and azithromycin -Morning CBC, CMP -Out of bed with assistance -PT/OT eval and treat -Given lactic acidosis and soft blood pressures will give 1 L normal saline bolus and start maintenance fluids at 100 mL/h - f/u blood and urine cx -Consider pulmonology consult should patient's respiratory status worsen  AKI, improved Creatinine on arrival 1.27>0.98currently.  Creatinine at discharge on 6/1 was 0.87.  Most likely perfusion related. -Monitor vital signs -Avoid nephrotoxic agents  Neuromyelitis optica Patient is legally blind Currently following with ophthalmology.  No acute changes in vision -Continue home ophthalmic solution  SLE Follows with [facility] rheumatology.  She was on hydroxychloroquine but is not taking it per her ophthalmologist request.  Home medication includes prednisone 5 mg daily and she receives rituximab infusions every 6 months.  While hospitalized for Covid 2 weeks ago she received a course of Decadron. -Restarted patient's home prednisone after discontinue Decadron  History of PE PE occurred in 2017.  Home medications include Xarelto 10 mg daily.  Patient negative for antiphospholipid syndrome -Continue home Xarelto  History of transverse myelitis, Devic's disease Patient pain developed in 2017.  Chronic left upper and lower extremity weakness.  Home medications include tramadol, Neurontin, Lyrica, Trileptal. -Continue home schedule Trileptal and Lyrica -On tramadol as needed  Chronic interstitial lung disease Patient sees pulmonology as well as asthma and allergy.  PFT on 11/24/2018 unremarkable.  Per chart review from previous admission the patient had PFT performed December 2020 which they were  unable to find records for.  Home medication includes  Flonase nasal spray.  Insomnia Home medication includes Ativan 0.5 mg nightly. -Melatonin as needed  Prednisone-induced osteoporosis Patient is on alendronate diet at home, is supposed to be on 2-year holiday given longstanding use  Mood disorder Home medication includes Cymbalta 60 mg daily -Continue home Cymbalta  Hypertension Blood pressure overnight ranged from 98-103/52-66.  Patient was previously recommended to take Norvasc by cardiology but it does not appear that she is on any antihypertensive at this time. -Continue to monitor  Aortic regurgitation Patient had evidence on TEE with no evidence of structural changes.  No treatment necessary per cardiology.  History of CVA Patient had CVA in 2008.  Home medications include Xarelto -Continue home Xarelto, no antiplatelets  FEN/GI: regular diet PPx: Lovenox  Disposition: admitted to progressive  Subjective:  Patient states she feels her breathing is slightly better than when she came in.  She denies pains at this time.  Continuing on 5 L nasal cannula.  Objective: Temp:  [98.8 F (37.1 C)-103.2 F (39.6 C)] 98.8 F (37.1 C) (06/14 0407) Pulse Rate:  [86-144] 89 (06/14 0500) Resp:  [16-55] 25 (06/14 0500) BP: (82-121)/(41-81) 99/57 (06/14 0500) SpO2:  [90 %-100 %] 99 % (06/14 0500) Weight:  [88.3 kg] 88.3 kg (06/13 1833) Physical Exam: General: Alert and oriented in no apparent distress Heart: Regular rate and rhythm with no murmurs appreciated Lungs: Mildly tachypneic, mildly increased work of breathing, left lower lobe with faint crackles vs right Abdomen: No abdominal pain Skin: Warm and dry Extremities: No lower extremity edema   Laboratory: Recent Labs  Lab 02/21/20 1831 02/21/20 1945 02/22/20 0249  WBC 7.1  --  5.6  HGB 11.1* 10.5* 9.4*  HCT 35.9* 31.0* 30.5*  PLT 142*  --  103*   Recent Labs  Lab 02/21/20 1831 02/21/20 1945 02/22/20 0249  NA 133* 136 139  K 4.1 3.9 4.7  CL 105  --   115*  CO2 14*  --  16*  BUN 16  --  13  CREATININE 1.27*  --  0.98  CALCIUM 8.4*  --  7.3*  PROT 6.5  --   --   BILITOT 1.5*  --   --   ALKPHOS 77  --   --   ALT 168*  --   --   AST 298*  --   --   GLUCOSE 153*  --  124*    Imaging/Diagnostic Tests: CT Head Wo Contrast  Result Date: 02/04/2020 CLINICAL DATA:  Acute pain due to trauma. EXAM: CT HEAD WITHOUT CONTRAST TECHNIQUE: Contiguous axial images were obtained from the base of the skull through the vertex without intravenous contrast. COMPARISON:  MRI dated November 25, 2015. CT head dated September 21, 2015. FINDINGS: Brain: No evidence of acute infarction, hemorrhage, hydrocephalus, extra-axial collection or mass lesion/mass effect. Vascular: No hyperdense vessel or unexpected calcification. Skull: Normal. Negative for fracture or focal lesion. Sinuses/Orbits: There is pansinus mucosal thickening. The mastoid air cells are essentially clear. Other: None. IMPRESSION: 1. No acute intracranial abnormality. 2. Pansinus mucosal thickening. Electronically Signed   By: Constance Holster M.D.   On: 02/04/2020 17:52   DG Chest Port 1 View  Result Date: 02/21/2020 CLINICAL DATA:  Shortness of breath.  COVID positive. EXAM: PORTABLE CHEST 1 VIEW COMPARISON:  Feb 04, 2020 FINDINGS: There are hazy bilateral airspace opacities, greatest at the lung bases bilaterally. There is no pneumothorax. There are small bilateral pleural effusions,  right greater than left. The cardiac silhouette appears enlarged but stable from prior study. There is no acute osseous abnormality. IMPRESSION: 1. Multifocal pneumonia (viral or bacterial). 2. Small bilateral pleural effusions, right greater than left. Electronically Signed   By: Constance Holster M.D.   On: 02/21/2020 19:09   DG Chest Port 1 View  Result Date: 02/04/2020 CLINICAL DATA:  COVID-19.  Cough.  Shortness of breath. EXAM: PORTABLE CHEST 1 VIEW COMPARISON:  11/06/2018 FINDINGS: The heart size and pulmonary  vascularity are normal. Slight haziness at the left lung base laterally. Chronic scarring at the right lung base. No effusions. No bone abnormality of significance. IMPRESSION: Slight haziness at the left lung base laterally which could represent a small area of infiltrate. Scarring at the right lung base. Electronically Signed   By: Lorriane Shire M.D.   On: 02/04/2020 14:48    Lurline Del, DO 02/22/2020, 6:21 AM PGY-1, [place] Intern pager: [phone], text pages welcome

## 2020-02-22 NOTE — Plan of Care (Signed)
  Problem: Respiratory: Goal: Will maintain a patent airway Outcome: Progressing Goal: Complications related to the disease process, condition or treatment will be avoided or minimized Outcome: Progressing   

## 2020-02-22 NOTE — Discharge Summary (Signed)
[facility] Discharge Summary  Patient name: Andrea Berry Medical record number: [id number] Date of birth: 11/24/1965 Age: 54 y.o. Gender: female Date of Admission: 02/21/2020  Date of Discharge: 03/03/2020 Admitting Physician: Lind Covert, MD  Primary Care Provider: McDiarmid, Blane Ohara, MD Consultants: None  Indication for Hospitalization: Shortness of breath  Discharge Diagnoses/Problem List:  Principal Problem:   Fever Active Problems:   Legal blindness   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[place])   Chronic central neuropathic pain   Mild cognitive impairment   SLE (systemic lupus erythematosus related syndrome) ([place])   History of pulmonary embolism   ILD (interstitial lung disease) ([place])   COVID-19   History of COVID-19   Pneumonia   Microcytic anemia  Disposition: Home  Discharge Condition: Stable  Discharge Exam:  General: Alert and oriented in no apparent distress Heart: Regular rate and rhythm with no murmurs appreciated Lungs: Faint crackles in bilateral bases, seem to be improved from yesterday, nasal cannula in place on 1 L Abdomen: Bowel sounds present, no abdominal pain Skin: Warm and dry  Brief Hospital Course:   SOB -Recent Covid admission- Haemophilus Influenza Pneumonia -Ongoing Fevers Patient previously diagnosed with COVID-19 on 02/04/2020 and hospitalized from 5/27-6/1.  Patient presented to the emergency department with shortness of breath and tachypnea with O2 sat on arrival at 89% and placed on 4 L nasal cannula.  Initial labs significant for lactic acid 2.2, increasing to 2.8.  Patient also had elevated procalcitonin level of 17.5.  Chest x-ray suggested multifocal pneumonia which was either bacterial or viral and small bilateral pleural effusions.  Patient's antibiotics were initially wide with cefepime, Vanco, azithromycin.  These were narrowed to azithromycin and ceftriaxone.  Patient continued on azithromycin and  ceftriaxone for 2 days but then began spiking fevers up to 103.  Patient's antibiotics were broadened to cefepime and vancomycin for concern for Pseudomonas/MRSA and infectious disease was consulted.  Infectious disease determined to be appropriate to narrow patient's antibiotics to ceftriaxone for Haemophilus influenza which was growing in her sputum culture and to continue to monitor her fevers.  Patient continued to improve to improve clinically with reduced oxygen needs but continued to have fevers for several days.  Ceftriaxone was discontinued at the recommendation of infectious disease.  After ceftriaxone was discontinued the patient had no further fevers for the duration of the hospitalization.  The patient's tachycardia and tachypnea also improved after discontinuing the ceftriaxone.  During the patient's ongoing fevers [facility] rheumatology was contacted as patient follows with them as an outpatient and recommended checking labs including haptoglobin, C3, C4, double-stranded DNA, echo to look for possible causes of the fever including lupus flare, other inflammatory/autoimmune conditions, or prolonged Covid.  Echocardiogram results: Possible HOCM Patient's echocardiogram showed possible HOCM and so cardiology was consulted.  Cardiology recommended patient start Cardizem which was increased up to 60 mg every 8 hours prior to discharge.  Cardiology signed off after patient continued to do well with outpatient follow-up planned  Issues for Follow Up:  1. Recommend visit with PCP to ensure breathing status has improved 2. Recommend PCP check CBC 1 to 2 weeks after discharge to monitor hemoglobin (stable at time of discharge) 3. Patient with diagnosis of possible HOCM per echocardiogram, monitor patient's fluid status and avoid beta-blockers per cardiology. 4. Recommend patient follow-up with her rheumatologist for ongoing management of her lupus 5. Patient started on Cardizem 60mg  every 8 hours  per cardiology.  Consider changing this to one  time per day dose if appropriate. 6. During the hospitalization patient's Linzess was reduced to 100 mg 3 times per day 7. Patient's home Flexeril and Ativan were stopped at discharge  Significant Procedures: None  Significant Labs and Imaging:  Recent Labs  Lab 03/01/20 0920 03/02/20 0222 03/03/20 0903  WBC 7.1 7.1 5.5  HGB 8.6* 8.4* 8.8*  HCT 27.3* 27.4* 29.5*  PLT 318 310 381   Recent Labs  Lab 02/28/20 0404 02/28/20 0404 02/29/20 0307 02/29/20 0307 03/01/20 0920 03/01/20 0920 03/01/20 1155 03/02/20 0222 03/03/20 0903  NA 136  --  136  --  137  --   --  141 138  K 3.7   < > 3.6   < > 3.6   < >  --  3.7 3.6  CL 105  --  103  --  104  --   --  105 102  CO2 20*  --  19*  --  23  --   --  23 24  GLUCOSE 99  --  102*  --  110*  --   --  90 95  BUN 9  --  7  --  7  --   --  8 9  CREATININE 0.80  --  0.84  --  0.74  --   --  0.74 0.61  CALCIUM 8.6*  --  8.4*  --  8.8*  --   --  8.8* 8.8*  ALKPHOS 78  --   --   --  138*  --  133* 127* 124  AST 86*  --   --   --  270*  --  273* 228* 182*  ALT 48*  --   --   --  113*  --  111* 107* 103*  ALBUMIN 2.1*  --   --   --  2.1*  --  2.2* 2.2* 2.3*   < > = values in this interval not displayed.   DG Chest 2 View  Result Date: 02/29/2020 CLINICAL DATA:  Shortness of breath and weakness EXAM: CHEST - 2 VIEW COMPARISON:  02/24/2020, 02/21/2020, 02/04/2020, CT 02/25/2020 FINDINGS: Small bilateral pleural effusions. Enlarged cardiomediastinal silhouette with central vascular congestion. Diffuse increased interstitial and ground-glass opacity with patchy bilateral consolidations. No pneumothorax. IMPRESSION: 1. Slight increased ground-glass opacity and consolidation in the right upper lobe though with some clearing of infiltrate from right base. Remaining bilateral pneumonia otherwise unchanged. 2. There is cardiomegaly, vascular congestion and small pleural effusions. There may be an underlying  component of mild interstitial edema. Electronically Signed   By: Donavan Foil M.D.   On: 02/29/2020 16:22   CT Head Wo Contrast  Result Date: 02/04/2020 CLINICAL DATA:  Acute pain due to trauma. EXAM: CT HEAD WITHOUT CONTRAST TECHNIQUE: Contiguous axial images were obtained from the base of the skull through the vertex without intravenous contrast. COMPARISON:  MRI dated November 25, 2015. CT head dated September 21, 2015. FINDINGS: Brain: No evidence of acute infarction, hemorrhage, hydrocephalus, extra-axial collection or mass lesion/mass effect. Vascular: No hyperdense vessel or unexpected calcification. Skull: Normal. Negative for fracture or focal lesion. Sinuses/Orbits: There is pansinus mucosal thickening. The mastoid air cells are essentially clear. Other: None. IMPRESSION: 1. No acute intracranial abnormality. 2. Pansinus mucosal thickening. Electronically Signed   By: Constance Holster M.D.   On: 02/04/2020 17:52   CT CHEST W CONTRAST  Result Date: 02/25/2020 CLINICAL DATA:  Pneumonia EXAM: CT CHEST WITH CONTRAST TECHNIQUE: Multidetector CT imaging of  the chest was performed during intravenous contrast administration. CONTRAST:  149mL OMNIPAQUE IOHEXOL 300 MG/ML  SOLN COMPARISON:  02/24/2020 FINDINGS: Cardiovascular: Heart is unremarkable without pericardial effusion. Normal caliber of the thoracic aorta. No significant atherosclerosis. Mediastinum/Nodes: No enlarged mediastinal, hilar, or axillary lymph nodes. Thyroid gland, trachea, and esophagus demonstrate no significant findings. Lungs/Pleura: There is multifocal bilateral airspace disease, most pronounced within the left upper lobe. Trace bilateral pleural effusions are noted. Minimal dependent lower lobe atelectasis. No pneumothorax. Central airways are patent. Upper Abdomen: No acute abnormality. Musculoskeletal: No acute or destructive bony lesions. Reconstructed images demonstrate no additional findings. IMPRESSION: 1. Multifocal bilateral  pneumonia, not significantly changed since previous chest x-ray. 2. Trace bilateral pleural effusions and minimal dependent lower lobe atelectasis. Electronically Signed   By: Randa Ngo M.D.   On: 02/25/2020 23:57   DG CHEST PORT 1 VIEW  Result Date: 02/24/2020 CLINICAL DATA:  Fever EXAM: PORTABLE CHEST 1 VIEW COMPARISON:  February 21, 2020 FINDINGS: There is airspace opacity throughout the right mid and lower lung regions as well as in the left mid lung and left base regions. There is atelectatic change in the medial right base. There is a probable small left pleural effusion. Heart is slightly enlarged with pulmonary vascularity normal. No adenopathy. No bone lesions. IMPRESSION: Multifocal pneumonia, slightly increased in the left mid lung and stable elsewhere. Probable small left pleural effusion. Medial right base atelectasis. Stable cardiac prominence.  No adenopathy evident. Electronically Signed   By: Lowella Grip III M.D.   On: 02/24/2020 10:20   DG Chest Port 1 View  Result Date: 02/21/2020 CLINICAL DATA:  Shortness of breath.  COVID positive. EXAM: PORTABLE CHEST 1 VIEW COMPARISON:  Feb 04, 2020 FINDINGS: There are hazy bilateral airspace opacities, greatest at the lung bases bilaterally. There is no pneumothorax. There are small bilateral pleural effusions, right greater than left. The cardiac silhouette appears enlarged but stable from prior study. There is no acute osseous abnormality. IMPRESSION: 1. Multifocal pneumonia (viral or bacterial). 2. Small bilateral pleural effusions, right greater than left. Electronically Signed   By: Constance Holster M.D.   On: 02/21/2020 19:09   DG Chest Port 1 View  Result Date: 02/04/2020 CLINICAL DATA:  COVID-19.  Cough.  Shortness of breath. EXAM: PORTABLE CHEST 1 VIEW COMPARISON:  11/06/2018 FINDINGS: The heart size and pulmonary vascularity are normal. Slight haziness at the left lung base laterally. Chronic scarring at the right lung base. No  effusions. No bone abnormality of significance. IMPRESSION: Slight haziness at the left lung base laterally which could represent a small area of infiltrate. Scarring at the right lung base. Electronically Signed   By: Lorriane Shire M.D.   On: 02/04/2020 14:48   ECHOCARDIOGRAM COMPLETE  Result Date: 03/01/2020    ECHOCARDIOGRAM REPORT   Patient Name:   ADEN YOUNGMAN Date of Exam: 03/01/2020 Medical Rec #:  [id number]     Height:       61.0 in Accession #:    [id number]    Weight:       194.7 lb Date of Birth:  Jul 11, 1966     BSA:          1.867 m Patient Age:    57 years      BP:           105/60 mmHg Patient Gender: F             HR:  111 bpm. Exam Location:  Inpatient Procedure: 2D Echo, Cardiac Doppler and Color Doppler Indications:    Abnormal ECG  History:        Patient has prior history of Echocardiogram examinations, most                 recent 10/17/2015. H/o pulmonary embolus. COVID-19.  Sonographer:    Clayton Lefort RDCS (AE) Referring Phys: [id number] CARINA M BROWN  Sonographer Comments: Patient is morbidly obese. Image acquisition challenging due to patient body habitus. IMPRESSIONS  1. Left ventricular ejection fraction, by estimation, is 70 to 75%. The left ventricle has hyperdynamic function. The left ventricle has no regional wall motion abnormalities. There is moderate concentric left ventricular hypertrophy and severe basal septal hypertrophy. There is a mid intracavitary gradient due to hyperdynamic LVF with peak resting gradient 37mmHg.  2. Right ventricular systolic function is normal. The right ventricular size is normal. Tricuspid regurgitation signal is inadequate for assessing PA pressure.  3. The mitral valve is normal in structure. No evidence of mitral valve regurgitation. No evidence of mitral stenosis.  4. The aortic valve is normal in structure. Aortic valve regurgitation is not visualized. No aortic stenosis is present.  5. The inferior vena cava is normal in size with greater  than 50% respiratory variability, suggesting right atrial pressure of 3 mmHg.  6. Findings consistent with possible HOCM. No evidence of SAM. FINDINGS  Left Ventricle: Left ventricular ejection fraction, by estimation, is 70 to 75%. The left ventricle has hyperdynamic function. The left ventricle has no regional wall motion abnormalities. The left ventricular internal cavity size was normal in size. There is moderate concentric left ventricular hypertrophy. Left ventricular diastolic parameters are consistent with Grade I diastolic dysfunction (impaired relaxation). Normal left ventricular filling pressure. Right Ventricle: The right ventricular size is normal. No increase in right ventricular wall thickness. Right ventricular systolic function is normal. Tricuspid regurgitation signal is inadequate for assessing PA pressure. Left Atrium: Left atrial size was normal in size. Right Atrium: Right atrial size was not well visualized. Pericardium: There is no evidence of pericardial effusion. Mitral Valve: The mitral valve is normal in structure. Normal mobility of the mitral valve leaflets. No evidence of mitral valve regurgitation. No evidence of mitral valve stenosis. Tricuspid Valve: The tricuspid valve is normal in structure. Tricuspid valve regurgitation is not demonstrated. No evidence of tricuspid stenosis. Aortic Valve: The aortic valve is normal in structure. Aortic valve regurgitation is not visualized. No aortic stenosis is present. Pulmonic Valve: The pulmonic valve was normal in structure. Pulmonic valve regurgitation is not visualized. No evidence of pulmonic stenosis. Aorta: The aortic root is normal in size and structure. Venous: The inferior vena cava is normal in size with greater than 50% respiratory variability, suggesting right atrial pressure of 3 mmHg. IAS/Shunts: No atrial level shunt detected by color flow Doppler.  LEFT VENTRICLE PLAX 2D LVIDd:         3.10 cm  Diastology LVIDs:         1.90  cm  LV e' lateral:   4.57 cm/s LV PW:         1.50 cm  LV E/e' lateral: 12.5 LV IVS:        2.10 cm  LV e' medial:    5.00 cm/s LVOT diam:     1.80 cm  LV E/e' medial:  11.4 LVOT Area:     2.54 cm  LEFT ATRIUM  Index LA diam:        3.40 cm 1.82 cm/m LA Vol (A2C):   43.4 ml 23.24 ml/m LA Vol (A4C):   36.3 ml 19.44 ml/m LA Biplane Vol: 41.0 ml 21.96 ml/m   AORTA Ao Root diam: 3.00 cm Ao Asc diam:  2.80 cm MITRAL VALVE MV Area (PHT): 4.17 cm    SHUNTS MV Decel Time: 182 msec    Systemic Diam: 1.80 cm MV E velocity: 57.20 cm/s MV A velocity: 69.10 cm/s MV E/A ratio:  0.83 Fransico Him MD Electronically signed by Fransico Him MD Signature Date/Time: 03/01/2020/1:32:53 PM    Final    US Abdomen Limited RUQ  Result Date: 03/01/2020 CLINICAL DATA:  54 year old female with elevated LFTs. EXAM: ULTRASOUND ABDOMEN LIMITED RIGHT UPPER QUADRANT COMPARISON:  Right upper quadrant ultrasound dated 08/29/2012. FINDINGS: Gallbladder: There is a 15 mm stone in the gallbladder. No gallbladder wall thickening or pericholecystic fluid. Reported positive sonographic Murphy's sign. Common bile duct: Diameter: 7 mm Liver: No focal lesion identified. Within normal limits in parenchymal echogenicity. Portal vein is patent on color Doppler imaging with normal direction of blood flow towards the liver. Other: None. IMPRESSION: Cholelithiasis with equivocal sonographic findings for acute cholecystitis. A hepatobiliary scintigraphy may provide better evaluation of the gallbladder if there is a high clinical concern for acute cholecystitis . Electronically Signed   By: Anner Crete M.D.   On: 03/01/2020 20:06    Results/Tests Pending at Time of Discharge:  Unresulted Labs (From admission, onward) Comment          Start     Ordered   03/04/20 0500  Comprehensive metabolic panel  Tomorrow morning,   R       Question:  Specimen collection method  Answer:  Lab=Lab collect   03/03/20 1230   03/02/20 0500  CBC  Daily,    R     Question:  Specimen collection method  Answer:  IV Team=IV Team collect   02/29/20 2220   03/01/20 1334  Anti-DNA antibody, double-stranded  Once,   R       Question:  Specimen collection method  Answer:  Lab=Lab collect   03/01/20 1333   03/01/20 1333  Pathologist smear review  Once,   AD        03/01/20 1333   02/27/20 1244  Culture, fungus without smear  Once,   R       Question:  Patient immune status  Answer:  Immunocompromised   02/27/20 1243   02/27/20 1241  Acid Fast Smear (AFB)  (AFB smear + Culture w reflexed sensitivities panel)  Once,   R       Question:  Patient immune status  Answer:  Immunocompromised  "And" Linked Group Details   02/27/20 1241   02/27/20 1241  Acid Fast Culture with reflexed sensitivities  (AFB smear + Culture w reflexed sensitivities panel)  Once,   R       Question:  Patient immune status  Answer:  Immunocompromised  "And" Linked Group Details   02/27/20 1241   02/27/20 1240  Expectorated sputum assessment w rflx to resp cult  Once,   R       Comments: Including fungal cultures   Question:  Patient immune status  Answer:  Immunocompromised   02/27/20 1241          Discharge Medications:  Allergies as of 03/03/2020      Reactions   Beta Adrenergic Blockers Other (See Comments)  Diagnosis of hypertrophic cardiomyopathy      Medication List    STOP taking these medications   cyclobenzaprine 10 MG tablet Commonly known as: FLEXERIL   dexamethasone 6 MG tablet Commonly known as: DECADRON   LORazepam 0.5 MG tablet Commonly known as: ATIVAN   OXcarbazepine 150 MG tablet Commonly known as: TRILEPTAL     TAKE these medications   alendronate 70 MG tablet Commonly known as: FOSAMAX TAKE 1 TABLET BY MOUTH IN THE MORNING EVERY 7 DAYS What changed:   how much to take  how to take this  when to take this  additional instructions   azelastine 0.1 % nasal spray Commonly known as: ASTELIN Place 2 sprays into both nostrils 2  (two) times daily as needed for rhinitis.   diltiazem 60 MG tablet Commonly known as: CARDIZEM Take 1 tablet (60 mg total) by mouth every 8 (eight) hours.   DULoxetine 60 MG capsule Commonly known as: CYMBALTA TAKE 1 CAPSULE BY MOUTH EVERY DAY What changed: how much to take   esomeprazole 40 MG capsule Commonly known as: NEXIUM TAKE 1 CAPSULE BY MOUTH TWICE A DAY What changed: when to take this   famotidine 40 MG tablet Commonly known as: PEPCID Take 40 mg by mouth daily as needed for heartburn or indigestion.   fluticasone 50 MCG/ACT nasal spray Commonly known as: FLONASE Place 1 spray into both nostrils daily as needed for allergies or rhinitis.   ipratropium 0.03 % nasal spray Commonly known as: ATROVENT PLACE 2 SPRAYS INTO BOTH NOSTRILS EVERY 12 (TWELVE) HOURS.   Lastacaft 0.25 % Soln Generic drug: Alcaftadine Place 1 drop into both eyes 2 (two) times daily.   Linzess 145 MCG Caps capsule Generic drug: linaclotide TAKE ONE CAPSULE EVERY DAY ON EMPTY STOMACH AT LEAST 30MIN BEFORE 1ST MEAL OF THE DAY What changed: See the new instructions.   predniSONE 5 MG tablet Commonly known as: DELTASONE Take 1 tablet (5 mg total) by mouth daily.   pregabalin 100 MG capsule Commonly known as: LYRICA Take 1 capsule (100 mg total) by mouth 3 (three) times daily. What changed:   medication strength  See the new instructions.   RITUXAN IV Inject 1,000 mg into the vein See admin instructions. 1,000 mg into the vein every 6 months   rivaroxaban 10 MG Tabs tablet Commonly known as: XARELTO Take 1 tablet (10 mg total) by mouth daily. With a meal   tiZANidine 4 MG tablet Commonly known as: ZANAFLEX Take 4 mg by mouth at bedtime as needed for muscle spasms.   traMADol 50 MG tablet Commonly known as: ULTRAM TAKE 1 TABLET (50 MG TOTAL) BY MOUTH EVERY 6 (SIX) HOURS AS NEEDED. What changed: reasons to take this            Durable Medical Equipment  (From admission,  onward)         Start     Ordered   03/03/20 0000  For home use only DME oxygen       Question Answer Comment  Length of Need 6 Months   Mode or (Route) Nasal cannula   Liters per Minute 5   Frequency Continuous (stationary and portable oxygen unit needed)   Oxygen delivery system Gas      03/03/20 1528          Discharge Instructions: Please refer to Patient Instructions section of EMR for full details.  Patient was counseled important signs and symptoms that should prompt return to medical  care, changes in medications, dietary instructions, activity restrictions, and follow up appointments.   Follow-Up Appointments:  Follow-up Information    Charolette Forward, MD. Schedule an appointment as soon as possible for a visit.   Specialty: Cardiology Why: Please schedule a follow up appointment with the cardiologist in 2 weeks to follow up on your new blood pressure medication. Contact information: [address] [place] [address] [phone]        Mercie Eon, MD. Schedule an appointment as soon as possible for a visit in 1 week(s).   Specialty: Rheumatology Why: Please make an appointment with your rheumatologist for follow-up.  Recommend contacting them within the next week to schedule follow-up as available Contact information: [place] [address] [phone]               Lurline Del, DO 03/03/2020, 3:55 PM PGY-1, [place]

## 2020-02-22 NOTE — Evaluation (Signed)
Physical Therapy Evaluation Patient Details Name: Andrea Berry MRN: [id number] DOB: February 12, 1966 Today's Date: 02/22/2020   History of Present Illness  54 y.o. female presented to ED 02/21/20 with shortness of breath 2/2 recent Covid infection (5/27) with hospitalization 5/27-6/1. PMH is significant for neuromyelitis optica/legal blindness, SLE, chronic interstitial lung disease,hx of PE, chronic central neuropathic pain, mild cognitive impairment, mood disorder, steroid-induced osteopenia, migraine without aura, HTN, CVA, transverse myelitis with chronic LUE, LLE weakness  Clinical Impression   Pt admitted with above diagnosis. Patient currently weaker and tolerated less activity than when last seen during recent hospitalization (only walked 3 ft with RW). Her O2 sats remained 94-96% on 2L O2, however she expressed she was too fatigued to walk any farther. She reports although her home health aide cannot come to her home due to [place], that her son is currently with her 24/7 and assists her as needed.  Pt currently with functional limitations due to the deficits listed below (see PT Problem List). Pt will benefit from skilled PT to increase their independence and safety with mobility to allow discharge to the venue listed below.       Follow Up Recommendations Home health PT;Supervision for mobility/OOB    Equipment Recommendations  None recommended by PT    Recommendations for Other Services       Precautions / Restrictions Precautions Precautions: Fall Precaution Comments: legally blind      Mobility  Bed Mobility Overal bed mobility: Needs Assistance Bed Mobility: Supine to Sit     Supine to sit: Min assist;HOB elevated     General bed mobility comments: HOB ~20, no rail; only needed assist for finall scooting to EOB with rt hip  Transfers Overall transfer level: Needs assistance Equipment used: Rolling walker (2 wheeled) Transfers: Sit to/from Stand Sit to Stand: Min  assist         General transfer comment: pt prefers one hand on RW with other hand pushing up from bed as PT anchored RW  Ambulation/Gait Ambulation/Gait assistance: Min guard Gait Distance (Feet): 3 Feet Assistive device: Rolling walker (2 wheeled) Gait Pattern/deviations: Step-to pattern;Decreased stride length;Shuffle     General Gait Details: very short, shuffling steps; once seated and rested offered to try a bit longer walk and pt reported she was too tired   Financial trader Rankin (Stroke Patients Only)       Balance Overall balance assessment: Needs assistance Sitting-balance support: No upper extremity supported;Feet unsupported Sitting balance-Leahy Scale: Fair     Standing balance support: Bilateral upper extremity supported Standing balance-Leahy Scale: Poor Standing balance comment: needs external support in standing.                              Pertinent Vitals/Pain Pain Assessment: No/denies pain    Home Living Family/patient expects to be discharged to:: Private residence Living Arrangements: Children (son) Available Help at Discharge: Family;Personal care attendant (no attendant while sick with COVID; currently son 24/7) Type of Home: House Home Access: Stairs to enter Entrance Stairs-Rails: Psychiatric nurse of Steps: 2 Home Layout: One level Home Equipment: Environmental consultant - 2 wheels;Shower seat;Hand held shower head;Wheelchair - manual (shower seat does not fit tub; hemiwalker)      Prior Function Level of Independence: Independent with assistive device(s)   Gait / Transfers Assistance Needed: "Spiderman" wall to wall; denies  using RW or other AD; denies falls  ADL's / Homemaking Assistance Needed: currently doing sponge baths at sink with son to assist; does cooking with assist; manages her own meds        Hand Dominance   Dominant Hand: Right    Extremity/Trunk Assessment    Upper Extremity Assessment Upper Extremity Assessment: Defer to OT evaluation    Lower Extremity Assessment Lower Extremity Assessment: Generalized weakness (noted bil foot edema (rt worst))    Cervical / Trunk Assessment Cervical / Trunk Assessment: Normal  Communication   Communication: No difficulties  Cognition Arousal/Alertness: Awake/alert Behavior During Therapy: Flat affect Overall Cognitive Status: Within Functional Limits for tasks assessed                                 General Comments: basic cognition only assessed      General Comments General comments (skin integrity, edema, etc.): On 2L with sats 94-96% throughout session. Increased time for explanation of environment and desired task due to pt's blindness.     Exercises Other Exercises Other Exercises: supine-ankle pumps x 10, heelslides x 5 prior to move to sit EOB   Assessment/Plan    PT Assessment Patient needs continued PT services  PT Problem List Decreased strength;Decreased activity tolerance;Decreased balance;Decreased mobility;Decreased knowledge of use of DME;Cardiopulmonary status limiting activity       PT Treatment Interventions DME instruction;Gait training;Functional mobility training;Therapeutic activities;Therapeutic exercise;Balance training;Patient/family education    PT Goals (Current goals can be found in the Care Plan section)  Acute Rehab PT Goals Patient Stated Goal: to get better and get back home PT Goal Formulation: With patient Time For Goal Achievement: 03/07/20 Potential to Achieve Goals: Good    Frequency Min 3X/week   Barriers to discharge        Co-evaluation               AM-PAC PT "6 Clicks" Mobility  Outcome Measure Help needed turning from your back to your side while in a flat bed without using bedrails?: None Help needed moving from lying on your back to sitting on the side of a flat bed without using bedrails?: A Little Help needed  moving to and from a bed to a chair (including a wheelchair)?: A Little Help needed standing up from a chair using your arms (e.g., wheelchair or bedside chair)?: A Little Help needed to walk in hospital room?: A Little Help needed climbing 3-5 steps with a railing? : A Lot 6 Click Score: 18    End of Session Equipment Utilized During Treatment: Gait belt;Oxygen Activity Tolerance: Patient limited by fatigue Patient left: in chair;with call bell/phone within reach;with chair alarm set Nurse Communication: Mobility status;Other (comment) (IV came out left hand) PT Visit Diagnosis: Muscle weakness (generalized) (M62.81);Difficulty in walking, not elsewhere classified (R26.2)    Time: 1122-1207 PT Time Calculation (min) (ACUTE ONLY): 45 min   Charges:   PT Evaluation $PT Eval Low Complexity: 1 Low PT Treatments $Therapeutic Activity: 23-37 mins         Arby Barrette, PT Pager [phone]   Rexanne Mano 02/22/2020, 1:39 PM

## 2020-02-22 NOTE — Progress Notes (Signed)
Occupational Therapy Evaluation Patient Details Name: Andrea Berry MRN: [id number] DOB: [birth year] Today's Date: 02/22/2020    History of Present Illness 54 y.o. female presented to ED 02/21/20 with shortness of breath 2/2 recent Covid infection (5/27) with hospitalization 5/27-6/1. PMH is significant for neuromyelitis optica/legal blindness, SLE, chronic interstitial lung disease,hx of PE, chronic central neuropathic pain, mild cognitive impairment, mood disorder, steroid-induced osteopenia, migraine without aura, HTN, CVA, transverse myelitis with chronic LUE, LLE weakness   Clinical Impression   Patient lives at home with son who helps her bathe and manage home.  Patient manages own medications and does not usually use a walker but walks near walls.  Patient able to stand and walk with RW and min assist, mostly for guidance/direction due to vision impairment.  Can complete UB seated ADLs with set up and LB ADLs with min assist.  Patient on 2L O2 at home and during today's session.  SpO2 remained >92 throughout, even with mobility.  Will continue to follow with OT acutely to address the deficits listed below.      Follow Up Recommendations  No OT follow up;Supervision - Intermittent    Equipment Recommendations  None recommended by OT    Recommendations for Other Services       Precautions / Restrictions Precautions Precautions: Fall Precaution Comments: legally blind Restrictions Weight Bearing Restrictions: No      Mobility Bed Mobility Overal bed mobility: Needs Assistance Bed Mobility: Supine to Sit     Supine to sit: Min assist;HOB elevated     General bed mobility comments: OOB in chair  Transfers Overall transfer level: Needs assistance Equipment used: Rolling walker (2 wheeled) Transfers: Sit to/from Stand Sit to Stand: Min assist             Balance Overall balance assessment: Needs assistance Sitting-balance support: No upper extremity supported;Feet  unsupported Sitting balance-Leahy Scale: Fair     Standing balance support: Bilateral upper extremity supported Standing balance-Leahy Scale: Poor Standing balance comment: needs external support in standing.                            ADL either performed or assessed with clinical judgement   ADL Overall ADL's : Needs assistance/impaired Eating/Feeding: Set up;Sitting   Grooming: Set up;Supervision/safety;Standing   Upper Body Bathing: Set up;Sitting   Lower Body Bathing: Minimal assistance;Sit to/from stand   Upper Body Dressing : Set up;Sitting   Lower Body Dressing: Minimal assistance;Sit to/from stand   Toilet Transfer: Minimal assistance;RW;Regular Museum/gallery exhibitions officer and Hygiene: Minimal assistance;Sitting/lateral lean       Functional mobility during ADLs: Minimal assistance;Rolling walker General ADL Comments: If in own envirnment patient would likely be more independent, min assist for standing/walking ADLs for guidance and direction as requested by patient     Vision         Perception     Praxis      Pertinent Vitals/Pain Pain Assessment: No/denies pain     Hand Dominance Right   Extremity/Trunk Assessment Upper Extremity Assessment Upper Extremity Assessment: Generalized weakness   Lower Extremity Assessment Lower Extremity Assessment: Defer to PT evaluation   Cervical / Trunk Assessment Cervical / Trunk Assessment: Normal   Communication Communication Communication: No difficulties   Cognition Arousal/Alertness: Awake/alert Behavior During Therapy: Flat affect Overall Cognitive Status: Within Functional Limits for tasks assessed  General Comments  On 2L with sats 94-96% throughout session. Increased time for explanation of environment and desired task due to pt's blindness.     Exercises    Shoulder Instructions      Home Living Family/patient  expects to be discharged to:: Private residence Living Arrangements: Children Available Help at Discharge: Family;Personal care attendant (no attendant while sick with COVID; currently son 24/7) Type of Home: House Home Access: Stairs to enter [employer] of Steps: 2 Entrance Stairs-Rails: Right;Left Home Layout: One level     Bathroom Shower/Tub: Teacher, early years/pre: Standard Bathroom Accessibility: No   Home Equipment: Environmental consultant - 2 wheels;Shower seat;Hand held shower head;Wheelchair - manual (shower seat does not fit tub; hemiwalker)          Prior Functioning/Environment Level of Independence: Independent with assistive device(s)  Gait / Transfers Assistance Needed: "Spiderman" wall to wall; denies using RW or other AD; denies falls ADL's / Homemaking Assistance Needed: Son helps with showering, doing sponge baths right now. Also helps with IADLS but she manages own meds            OT Problem List: Decreased activity tolerance;Impaired balance (sitting and/or standing);Decreased knowledge of use of DME or AE;Decreased knowledge of precautions;Cardiopulmonary status limiting activity;Decreased strength;Impaired vision/perception      OT Treatment/Interventions: Self-care/ADL training;Therapeutic exercise;Energy conservation;DME and/or AE instruction;Therapeutic activities;Patient/family education    OT Goals(Current goals can be found in the care plan section) Acute Rehab OT Goals Patient Stated Goal: to get better and get back home OT Goal Formulation: With patient Time For Goal Achievement: 03/07/20 Potential to Achieve Goals: Good  OT Frequency: Min 2X/week   Barriers to D/C:            Co-evaluation              AM-PAC OT "6 Clicks" Daily Activity     Outcome Measure Help from another person eating meals?: A Little Help from another person taking care of personal grooming?: A Little Help from another person toileting, which  includes using toliet, bedpan, or urinal?: A Little Help from another person bathing (including washing, rinsing, drying)?: A Little Help from another person to put on and taking off regular upper body clothing?: A Little Help from another person to put on and taking off regular lower body clothing?: A Little 6 Click Score: 18   End of Session Equipment Utilized During Treatment: Rolling walker;Oxygen Nurse Communication: Mobility status  Activity Tolerance: Patient tolerated treatment well Patient left: in chair;with call bell/phone within reach  OT Visit Diagnosis: Unsteadiness on feet (R26.81);Other abnormalities of gait and mobility (R26.89);Muscle weakness (generalized) (M62.81)                Time: 5300-5110 OT Time Calculation (min): 23 min Charges:  OT General Charges $OT Visit: 1 Visit OT Evaluation $OT Eval Moderate Complexity: 1 Mod OT Treatments $Self Care/Home Management : 8-22 mins  August Luz, OTR/L   Phylliss Bob 02/22/2020, 2:10 PM

## 2020-02-23 LAB — CBC
HCT: 31 % — ABNORMAL LOW (ref 36.0–46.0)
Hemoglobin: 9.6 g/dL — ABNORMAL LOW (ref 12.0–15.0)
MCH: 21.4 pg — ABNORMAL LOW (ref 26.0–34.0)
MCHC: 31 g/dL (ref 30.0–36.0)
MCV: 69.2 fL — ABNORMAL LOW (ref 80.0–100.0)
Platelets: 152 10*3/uL (ref 150–400)
RBC: 4.48 MIL/uL (ref 3.87–5.11)
RDW: 15.9 % — ABNORMAL HIGH (ref 11.5–15.5)
WBC: 7.7 10*3/uL (ref 4.0–10.5)
nRBC: 0 % (ref 0.0–0.2)

## 2020-02-23 LAB — URINE CULTURE: Culture: <10000 — AB

## 2020-02-23 LAB — BASIC METABOLIC PANEL
Anion gap: 11 (ref 5–15)
BUN: 11 mg/dL (ref 6–20)
CO2: 18 mmol/L — ABNORMAL LOW (ref 22–32)
Calcium: 8.4 mg/dL — ABNORMAL LOW (ref 8.9–10.3)
Chloride: 113 mmol/L — ABNORMAL HIGH (ref 98–111)
Creatinine, Ser: 0.75 mg/dL (ref 0.44–1.00)
GFR calc Af Amer: >60 mL/min (ref >60)
GFR calc non Af Amer: >60 mL/min (ref >60)
Glucose, Bld: 119 mg/dL — ABNORMAL HIGH (ref 70–99)
Potassium: 4.6 mmol/L (ref 3.5–5.1)
Sodium: 142 mmol/L (ref 135–145)

## 2020-02-23 NOTE — Progress Notes (Signed)
SATURATION QUALIFICATIONS: (This note is used to comply with regulatory documentation for home oxygen)  Patient Saturations on Room Air at Rest = 93%  Patient Saturations on Room Air while Ambulating = 85%  Patient Saturations on 2 Liters of oxygen while Ambulating = 90%  Please briefly explain why patient needs home oxygen:  To maintain oxygen saturation levels to allow her to safely mobilize and complete her ADLs.   Arby Barrette, PT Pager [phone]

## 2020-02-23 NOTE — Progress Notes (Signed)
Physical Therapy Treatment Patient Details Name: Andrea Berry MRN: [id number] DOB: July 18, 1966 Today's Date: 02/23/2020    History of Present Illness 54 y.o. female presented to ED 02/21/20 with shortness of breath 2/2 recent Covid infection (5/27) with hospitalization 5/27-6/1. PMH is significant for neuromyelitis optica/legal blindness, SLE, chronic interstitial lung disease,hx of PE, chronic central neuropathic pain, mild cognitive impairment, mood disorder, steroid-induced osteopenia, migraine without aura, HTN, CVA, transverse myelitis with chronic LUE, LLE weakness    PT Comments    Patient tolerated ambulation x 100 ft with RW on 2L (see ambulatory sat note). Patient reports MD said she may go home tomorrow and this would be appropriate from a PT perspective.    Follow Up Recommendations  Home health PT;Supervision for mobility/OOB     Equipment Recommendations  None recommended by PT    Recommendations for Other Services       Precautions / Restrictions Precautions Precautions: Fall Precaution Comments: legally blind    Mobility  Bed Mobility Overal bed mobility: Needs Assistance Bed Mobility: Rolling;Sidelying to Sit Rolling: Min assist Sidelying to sit: Min assist       General bed mobility comments: educated to pass through sidelying to get to sitting (for decreased back pain).   Transfers Overall transfer level: Needs assistance Equipment used: Rolling walker (2 wheeled) Transfers: Sit to/from Stand Sit to Stand: Min assist         General transfer comment: pt prefers one hand on RW with other hand pushing up from bed as PT anchored RW  Ambulation/Gait Ambulation/Gait assistance: Min guard Gait Distance (Feet): 100 Feet Assistive device: Rolling walker (2 wheeled) Gait Pattern/deviations: Step-to pattern;Decreased stride length Gait velocity: decreased   General Gait Details: initiated on room air with pt quickly decr to 85%; resumed O2 at 2L with  sats >90%   Stairs             Wheelchair Mobility    Modified Rankin (Stroke Patients Only)       Balance Overall balance assessment: Needs assistance Sitting-balance support: No upper extremity supported;Feet unsupported Sitting balance-Leahy Scale: Fair     Standing balance support: Bilateral upper extremity supported Standing balance-Leahy Scale: Poor Standing balance comment: needs external support in standing.                             Cognition Arousal/Alertness: Awake/alert Behavior During Therapy: Flat affect Overall Cognitive Status: Within Functional Limits for tasks assessed                                        Exercises      General Comments        Pertinent Vitals/Pain Pain Assessment: Faces Faces Pain Scale: Hurts even more Pain Location: back Pain Descriptors / Indicators: Sore Pain Intervention(s): Limited activity within patient's tolerance;Monitored during session    Home Living                      Prior Function            PT Goals (current goals can now be found in the care plan section) Acute Rehab PT Goals Patient Stated Goal: to get better and get back home Time For Goal Achievement: 03/07/20 Potential to Achieve Goals: Good Progress towards PT goals: Progressing toward goals    Frequency  Min 3X/week      PT Plan Current plan remains appropriate    Co-evaluation              AM-PAC PT "6 Clicks" Mobility   Outcome Measure  Help needed turning from your back to your side while in a flat bed without using bedrails?: A Little Help needed moving from lying on your back to sitting on the side of a flat bed without using bedrails?: A Little Help needed moving to and from a bed to a chair (including a wheelchair)?: A Little Help needed standing up from a chair using your arms (e.g., wheelchair or bedside chair)?: A Little Help needed to walk in hospital room?: A  Little Help needed climbing 3-5 steps with a railing? : A Lot 6 Click Score: 17    End of Session Equipment Utilized During Treatment: Oxygen Activity Tolerance: Patient tolerated treatment well Patient left: in chair;with nursing/sitter in room (at sink with NT to do her bath) Nurse Communication: Mobility status PT Visit Diagnosis: Muscle weakness (generalized) (M62.81);Difficulty in walking, not elsewhere classified (R26.2)     Time: 5681-2751 PT Time Calculation (min) (ACUTE ONLY): 19 min  Charges:  $Gait Training: 8-22 mins                      Arby Barrette, PT Pager [phone]    Rexanne Mano 02/23/2020, 5:26 PM

## 2020-02-23 NOTE — Progress Notes (Signed)
   02/23/20 2020  Assess: MEWS Score  Temp (!) 101 F (38.3 C)  BP 134/69  Pulse Rate (!) 108  ECG Heart Rate (!) 108  Resp (!) 38  SpO2 97 %  O2 Device Room Air  Assess: MEWS Score  MEWS Temp 1  MEWS Systolic 0  MEWS Pulse 1  MEWS RR 3  MEWS LOC 0  MEWS Score 5  MEWS Score Color Red  Assess: if the MEWS score is Yellow or Red  Were vital signs taken at a resting state? Yes  Focused Assessment Documented focused assessment  Early Detection of Sepsis Score *See Row Information* Low  MEWS guidelines implemented *See Row Information* No, vital signs rechecked  Treat  MEWS Interventions Administered prn meds/treatments  Take Vital Signs  Increase Vital Sign Frequency  Red: Q 1hr X 4 then Q 4hr X 4, if remains red, continue Q 4hrs  Escalate  MEWS: Escalate Red: discuss with charge nurse/RN and provider, consider discussing with RRT  Notify: Charge Nurse/RN  Name of Charge Nurse/RN Notified Kristen RN  Date Charge Nurse/RN Notified 02/23/20  Time Charge Nurse/RN Notified 2030    Patient spiked a temperature, resulting in a red mews & rests with a yellow mews since admission.  Agricultural consultant notified. Patient given tylenol, will check vitals.

## 2020-02-23 NOTE — Progress Notes (Addendum)
Family Medicine Teaching Service Daily Progress Note Intern Pager: [phone]  Patient name: Andrea Berry Medical record number: [id number] Date of birth: 1966/01/07 Age: 54 y.o. Gender: female  Primary Care Provider: McDiarmid, Blane Ohara, MD Consultants: None Code Status: Full  Pt Overview and Major Events to Date:  6/13 - admitted  Assessment and Plan: Andrea Berry is a 54 y.o. female presenting with shortness of breath 2/2 recent Covid infection with hospitalization. PMH is significant for legal blindness, SLE, chronic interstitial lung disease,hx of PE on Xarelto, chronic central neuropathic pain, mild cognitive impairment, mood disorder, neuromyelitis optica, steroid-induced osteopenia, migraine without aura.  Acute respiratory distress 2/2 COVID-19 diagnosis, concern for sepsis Diagnosed with COVID-19 on 5/27.  Status post remdesivir and Decadron.  She was stable and improved and discharged.  EMS was called 6/13 for shortness of breath with tachypnea.  Chest x-ray was significant for multifocal pneumonia (viral or bacterial) with small pleural effusions right greater than left.  CBC significant for WBC 7.1>5.6>7.7, hemoglobin 11.1>9.4>9.6, hematocrit 35.9>30.5>31.0, MCV 69.7.  Lactic acid 2.2> 2.8>1.5.   Procalcitonin 17.5, suggestive of bacterial infection. Her Wells score is 4.5 given her previous PE.  This is less likely due to the fact that the patient is on Xarelto 10 mg daily.  Patient has now been advanced to room air with O2 sat 92% with some tachypnea of in the 20s/low 30s. -Continuous cardiac monitoring -Continuous pulse ox -Oxygen therapy, wean as possible but keep O2 sats greater than 88% -Discontinue Decadron -Continue ceftriaxone and azithromycin -Blood cultures negative less than 24 hours  AKI -  resolved Creatinine on arrival 1.27>0.75 currently.  Creatinine at discharge on 6/1 was 0.87.  Most likely perfusion related. -Monitor vital signs -Avoid nephrotoxic  agents  Neuromyelitis optica Patient is legally blind Currently following with ophthalmology.  No acute changes in vision -Continue home ophthalmic solution  SLE Follows with [facility] rheumatology.  She was on hydroxychloroquine but is not taking it per her ophthalmologist request.  Home medication includes prednisone 5 mg daily and she receives rituximab infusions every 6 months.  While hospitalized for Covid 2 weeks ago she received a course of Decadron. -Restarted patient's home prednisone after discontinued Decadron  History of PE PE occurred in 2017.  Home medications include Xarelto 10 mg daily.  Patient negative for antiphospholipid syndrome -Continue home Xarelto  History of transverse myelitis, Devic's disease Patient pain developed in 2017.  Chronic left upper and lower extremity weakness.  Home medications include tramadol, Neurontin, Lyrica, Trileptal. -Continue home schedule Trileptal and Lyrica -On tramadol as needed  Chronic interstitial lung disease Patient sees pulmonology as well as asthma and allergy.  PFT on 11/24/2018 unremarkable.  Per chart review from previous admission the patient had PFT performed December 2020 which they were unable to find records for.  Home medication includes Flonase nasal spray.  Insomnia Home medication includes Ativan 0.5 mg nightly. -Melatonin as needed  Prednisone-induced osteoporosis Patient is on alendronate diet at home, is supposed to be on 2-year holiday given longstanding use  Mood disorder Home medication includes Cymbalta 60 mg daily -Continue home Cymbalta  Hypertension Blood pressure overnight ranged from 107-109/55-71.  Patient was previously recommended to take Norvasc by cardiology but it does not appear that she is on any antihypertensive at this time. -Continue to monitor  Aortic regurgitation Patient had evidence on TEE with no evidence of structural changes.  No treatment necessary per  cardiology.  History of CVA Patient had CVA in  2008.  Home medications include Xarelto -Continue home Xarelto, no antiplatelets  FEN/GI: regular diet PPx: Lovenox  Disposition: Pending results of blood cultures and overall improvement, likely discharge in 24-48 hours.  Subjective:  Patient states her breathing has improved.  She states she did well overnight on room air.  Objective: Temp:  [97.5 F (36.4 C)-98.5 F (36.9 C)] 98.5 F (36.9 C) (06/15 0403) Pulse Rate:  [79-98] 98 (06/15 0403) Resp:  [20-33] 33 (06/15 0403) BP: (103-112)/(55-72) 107/71 (06/15 0403) SpO2:  [90 %-98 %] 92 % (06/15 0403) Physical Exam: General: Alert and oriented in no apparent distress Heart: Regular rate and rhythm with no murmurs appreciated Lungs: Lower field crackles present particularly in the left lower lobe Skin: Warm and dry Extremities: No lower extremity edema   Laboratory: Recent Labs  Lab 02/21/20 1831 02/21/20 1831 02/21/20 1945 02/22/20 0249 02/23/20 0301  WBC 7.1  --   --  5.6 7.7  HGB 11.1*   < > 10.5* 9.4* 9.6*  HCT 35.9*   < > 31.0* 30.5* 31.0*  PLT 142*  --   --  103* 152   < > = values in this interval not displayed.   Recent Labs  Lab 02/21/20 1831 02/21/20 1831 02/21/20 1945 02/22/20 0249 02/23/20 0301  NA 133*   < > 136 139 142  K 4.1   < > 3.9 4.7 4.6  CL 105  --   --  115* 113*  CO2 14*  --   --  16* 18*  BUN 16  --   --  13 11  CREATININE 1.27*  --   --  0.98 0.75  CALCIUM 8.4*  --   --  7.3* 8.4*  PROT 6.5  --   --   --   --   BILITOT 1.5*  --   --   --   --   ALKPHOS 77  --   --   --   --   ALT 168*  --   --   --   --   AST 298*  --   --   --   --   GLUCOSE 153*  --   --  124* 119*   < > = values in this interval not displayed.    Imaging/Diagnostic Tests: CT Head Wo Contrast  Result Date: 02/04/2020 CLINICAL DATA:  Acute pain due to trauma. EXAM: CT HEAD WITHOUT CONTRAST TECHNIQUE: Contiguous axial images were obtained from the base of  the skull through the vertex without intravenous contrast. COMPARISON:  MRI dated November 25, 2015. CT head dated September 21, 2015. FINDINGS: Brain: No evidence of acute infarction, hemorrhage, hydrocephalus, extra-axial collection or mass lesion/mass effect. Vascular: No hyperdense vessel or unexpected calcification. Skull: Normal. Negative for fracture or focal lesion. Sinuses/Orbits: There is pansinus mucosal thickening. The mastoid air cells are essentially clear. Other: None. IMPRESSION: 1. No acute intracranial abnormality. 2. Pansinus mucosal thickening. Electronically Signed   By: Constance Holster M.D.   On: 02/04/2020 17:52   DG Chest Port 1 View  Result Date: 02/21/2020 CLINICAL DATA:  Shortness of breath.  COVID positive. EXAM: PORTABLE CHEST 1 VIEW COMPARISON:  Feb 04, 2020 FINDINGS: There are hazy bilateral airspace opacities, greatest at the lung bases bilaterally. There is no pneumothorax. There are small bilateral pleural effusions, right greater than left. The cardiac silhouette appears enlarged but stable from prior study. There is no acute osseous abnormality. IMPRESSION: 1. Multifocal pneumonia (viral or bacterial). 2.  Small bilateral pleural effusions, right greater than left. Electronically Signed   By: Constance Holster M.D.   On: 02/21/2020 19:09   DG Chest Port 1 View  Result Date: 02/04/2020 CLINICAL DATA:  COVID-19.  Cough.  Shortness of breath. EXAM: PORTABLE CHEST 1 VIEW COMPARISON:  11/06/2018 FINDINGS: The heart size and pulmonary vascularity are normal. Slight haziness at the left lung base laterally. Chronic scarring at the right lung base. No effusions. No bone abnormality of significance. IMPRESSION: Slight haziness at the left lung base laterally which could represent a small area of infiltrate. Scarring at the right lung base. Electronically Signed   By: Lorriane Shire M.D.   On: 02/04/2020 14:48    Lurline Del, DO 02/23/2020, 6:36 AM PGY-1, [place] Intern pager: [phone], text pages welcome

## 2020-02-23 NOTE — Plan of Care (Signed)
  Problem: Education: Goal: Knowledge of risk factors and measures for prevention of condition will improve Outcome: Progressing   Problem: Respiratory: Goal: Will maintain a patent airway Outcome: Progressing Goal: Complications related to the disease process, condition or treatment will be avoided or minimized Outcome: Progressing   Problem: Education: Goal: Knowledge of General Education information will improve Description: Including pain rating scale, medication(s)/side effects and non-pharmacologic comfort measures Outcome: Progressing   Problem: Health Behavior/Discharge Planning: Goal: Ability to manage health-related needs will improve Outcome: Progressing   Problem: Clinical Measurements: Goal: Ability to maintain clinical measurements within normal limits will improve Outcome: Progressing Goal: Will remain free from infection Outcome: Progressing Goal: Diagnostic test results will improve Outcome: Progressing Goal: Respiratory complications will improve Outcome: Progressing Goal: Cardiovascular complication will be avoided Outcome: Progressing   Problem: Activity: Goal: Risk for activity intolerance will decrease Outcome: Progressing   Problem: Nutrition: Goal: Adequate nutrition will be maintained Outcome: Progressing   Problem: Coping: Goal: Level of anxiety will decrease Outcome: Progressing   Problem: Elimination: Goal: Will not experience complications related to bowel motility Outcome: Progressing Goal: Will not experience complications related to urinary retention Outcome: Progressing   Problem: Pain Managment: Goal: General experience of comfort will improve Outcome: Progressing   Problem: Safety: Goal: Ability to remain free from injury will improve Outcome: Progressing   Problem: Skin Integrity: Goal: Risk for impaired skin integrity will decrease Outcome: Progressing   

## 2020-02-24 ENCOUNTER — Inpatient Hospital Stay (HOSPITAL_COMMUNITY): Payer: Medicare Other

## 2020-02-24 LAB — BASIC METABOLIC PANEL
Anion gap: 7 (ref 5–15)
BUN: 12 mg/dL (ref 6–20)
CO2: 20 mmol/L — ABNORMAL LOW (ref 22–32)
Calcium: 8.5 mg/dL — ABNORMAL LOW (ref 8.9–10.3)
Chloride: 116 mmol/L — ABNORMAL HIGH (ref 98–111)
Creatinine, Ser: 0.78 mg/dL (ref 0.44–1.00)
GFR calc Af Amer: >60 mL/min (ref >60)
GFR calc non Af Amer: >60 mL/min (ref >60)
Glucose, Bld: 92 mg/dL (ref 70–99)
Potassium: 4 mmol/L (ref 3.5–5.1)
Sodium: 143 mmol/L (ref 135–145)

## 2020-02-24 LAB — CBC
HCT: 28.6 % — ABNORMAL LOW (ref 36.0–46.0)
Hemoglobin: 9 g/dL — ABNORMAL LOW (ref 12.0–15.0)
MCH: 21.3 pg — ABNORMAL LOW (ref 26.0–34.0)
MCHC: 31.5 g/dL (ref 30.0–36.0)
MCV: 67.6 fL — ABNORMAL LOW (ref 80.0–100.0)
Platelets: 199 10*3/uL (ref 150–400)
RBC: 4.23 MIL/uL (ref 3.87–5.11)
RDW: 15.6 % — ABNORMAL HIGH (ref 11.5–15.5)
WBC: 6.3 10*3/uL (ref 4.0–10.5)
nRBC: 0 % (ref 0.0–0.2)

## 2020-02-24 LAB — CULTURE, RESPIRATORY W GRAM STAIN

## 2020-02-24 LAB — URINALYSIS, ROUTINE W REFLEX MICROSCOPIC
Bacteria, UA: NONE SEEN
Bilirubin Urine: NEGATIVE
Glucose, UA: NEGATIVE mg/dL
Ketones, ur: NEGATIVE mg/dL
Leukocytes,Ua: NEGATIVE
Nitrite: NEGATIVE
Protein, ur: 100 mg/dL — AB
Specific Gravity, Urine: 1.028 (ref 1.005–1.030)
pH: 6 (ref 5.0–8.0)

## 2020-02-24 LAB — PROCALCITONIN: Procalcitonin: 24.19 ng/mL

## 2020-02-24 MED ORDER — VANCOMYCIN HCL 1250 MG/250ML IV SOLN
1250.0000 mg | Freq: Two times a day (BID) | INTRAVENOUS | Status: DC
  Administered 2020-02-24 – 2020-02-25 (×3): 1250 mg via INTRAVENOUS
  Filled 2020-02-24 (×4): qty 250

## 2020-02-24 MED ORDER — VANCOMYCIN HCL 1500 MG/300ML IV SOLN
1500.0000 mg | Freq: Once | INTRAVENOUS | Status: AC
  Administered 2020-02-24: 1500 mg via INTRAVENOUS
  Filled 2020-02-24: qty 300

## 2020-02-24 MED ORDER — SODIUM CHLORIDE 0.9 % IV SOLN
2.0000 g | Freq: Three times a day (TID) | INTRAVENOUS | Status: DC
  Administered 2020-02-24 – 2020-02-26 (×6): 2 g via INTRAVENOUS
  Filled 2020-02-24 (×7): qty 2

## 2020-02-24 NOTE — Progress Notes (Signed)
Pharmacy Antibiotic Note  Andrea Berry is a 54 y.o. female admitted on 02/21/2020 with pneumonia.Pt was treated with abx x 3 days, but spiked new fever 6/16. Plan to broaden abx to vanc and cefepime, continuing azithromycin. New BCx and UCx ordered and pending.  Pharmacy has been consulted for vancomycin dosing.  Pt with PMH of COVID-19 infection with hospitalization 5/27 - 6/1.  Plan: Loading Dose: Vancomycin 1500 mg IV x 1 Vancomycin 1250 mg IV every 12 hours.  Goal trough 15-20 mcg/mL.  Will monitor renal function, clinical status and cx data. Will check vanc trough level as indicated.   Height: 5\' 1"  (154.9 cm) Weight: 88.3 kg (194 lb 10.7 oz) IBW/kg (Calculated) : 47.8  Temp (24hrs), Avg:99.1 F (37.3 C), Min:97.7 F (36.5 C), Max:102.8 F (39.3 C)  Recent Labs  Lab 02/21/20 1831 02/21/20 1939 02/21/20 2301 02/22/20 0249 02/22/20 0301 02/23/20 0301 02/24/20 0249  WBC 7.1  --   --  5.6  --  7.7 6.3  CREATININE 1.27*  --   --  0.98  --  0.75 0.78  LATICACIDVEN 2.2* 2.8* 2.5*  --  1.5  --   --     Estimated Creatinine Clearance: 84.1 mL/min (by C-G formula based on SCr of 0.78 mg/dL).    No Known Allergies  Antimicrobials this admission: Cefepime 2 g x 1 6/13 Ceftriaxone 2 g q 24 h 6/14  >> 6/16 Azithromycin 500 mg q 24 h 6/14 >> Cefepime 2 g q 8 h 6/16 >> Vanc 1250 mg q 12h 6/16 >>  Dose adjustments this admission: None  Microbiology results: 6/16 BCx: need to be collected 6/16 UCx: need to be collected 6/14 UCx: insignificant growth  6/14 Sputum: Moderate Haemophilus Influenzae, beta-lactamase neg (6/16)  6/14 MRSA PCR: negative 6/13 BCx: no growth  Thank you for allowing pharmacy to be a part of this patient's care.  Elita Quick, PharmD Candidate 02/24/2020 8:57 AM

## 2020-02-24 NOTE — Progress Notes (Addendum)
Family Medicine Teaching Service Daily Progress Note Intern Pager: [phone]  Patient name: Andrea Berry Medical record number: [id number] Date of birth: 1966/06/09 Age: 54 y.o. Gender: female  Primary Care Provider: McDiarmid, Blane Ohara, MD Consultants: None Code Status: Full  Pt Overview and Major Events to Date:  6/13 - admitted  Assessment and Plan: [name] is a 54 y.o. female presenting with shortness of breath 2/2 recent Covid infection with hospitalization. PMH is significant for legal blindness, SLE, chronic interstitial lung disease,hx of PE on Xarelto, chronic central neuropathic pain, mild cognitive impairment, mood disorder, neuromyelitis optica, steroid-induced osteopenia, migraine without aura.  Acute respiratory distress 2/2 COVID-19 diagnosis, new fever 6/16 Diagnosed with COVID-19 on 5/27.  Status post remdesivir and Decadron. Patient was improving and has been on room air, unfortunately patient spiked a fever both last night and again today up to 102.8 Fahrenheit. Patient sputum cultures grew moderate Haemophilus influenza, urine cultures with less than 10,000 colonies and insignificant growth. Sputum culture shows rare gram-positive cocci in clusters. Of note patient was MRSA PCR negative on admission. Unsure etiology for new fever. Patient did not have coverage for Pseudomonas other than her initial antibiotics on presentation, however I would have expected her to worsen before now if this was present. Patient's urine has been negative patient without any new complaints suggestive of other etiologies for new infection -We will repeat chest x-ray, blood culture, urine culture -We will broaden antibiotics to vancomycin, cefepime  -Continuous cardiac monitoring -Continuous pulse ox  AKI -  resolved Creatinine on arrival 1.27>0.78 currently.  Creatinine at discharge on 6/1 was 0.87.  Most likely perfusion related. -Monitor vital signs -Avoid nephrotoxic  agents  Neuromyelitis optica Patient is legally blind Currently following with ophthalmology.  No acute changes in vision -Continue home ophthalmic solution  SLE Follows with [facility] rheumatology.  She was on hydroxychloroquine but is not taking it per her ophthalmologist request.  Home medication includes prednisone 5 mg daily and she receives rituximab infusions every 6 months.  While hospitalized for Covid 2 weeks ago she received a course of Decadron. -Restarted patient's home prednisone after discontinued Decadron  History of PE PE occurred in 2017.  Home medications include Xarelto 10 mg daily.  Patient negative for antiphospholipid syndrome -Continue home Xarelto  History of transverse myelitis, Devic's disease Patient pain developed in 2017.  Chronic left upper and lower extremity weakness.  Home medications include tramadol, Neurontin, Lyrica, Trileptal. -Continue home schedule Trileptal and Lyrica -On tramadol as needed  Chronic interstitial lung disease Patient sees pulmonology as well as asthma and allergy.  PFT on 11/24/2018 unremarkable.  Per chart review from previous admission the patient had PFT performed December 2020 which they were unable to find records for.  Home medication includes Flonase nasal spray.  Insomnia Home medication includes Ativan 0.5 mg nightly. -Melatonin as needed  Prednisone-induced osteoporosis Patient is on alendronate diet at home, is supposed to be on 2-year holiday given longstanding use  Mood disorder Home medication includes Cymbalta 60 mg daily -Continue home Cymbalta  Hypertension Most recent blood pressure 125/71.  Patient was previously recommended to take Norvasc by cardiology but it does not appear that she is on any antihypertensive at this time. -Continue to monitor  Aortic regurgitation Patient had evidence on TEE with no evidence of structural changes.  No treatment necessary per cardiology.  History of  CVA Patient had CVA in 2008.  Home medications include Xarelto -Continue home Xarelto, no antiplatelets  FEN/GI: regular diet PPx: Lovenox  Disposition: Pending results of blood cultures and overall improvement, likely discharge in 24-48 hours.  Subjective:  Patient was her breathing had improved and that she did well breathing on room air overnight. Patient did state that she felt nervous today but was not willing to discuss why.   Patient unfortunately spiked a fever both last night and this morning.  Objective: Temp:  [97.7 F (36.5 C)-101 F (38.3 C)] 98.2 F (36.8 C) (06/16 0400) Pulse Rate:  [95-110] 99 (06/16 0400) Resp:  [22-38] 37 (06/16 0400) BP: (103-134)/(56-71) 125/71 (06/16 0400) SpO2:  [90 %-98 %] 98 % (06/16 0400) Physical Exam: General: Alert and oriented in no apparent distress Heart: Rate tachycardic with regular rhythm, no murmurs appreciated  Lungs: Crackles in bilateral bases, mild expiratory wheeze present, no respiratory distress on room air Abdomen: Bowel sounds present, no abdominal pain Skin: Warm and dry  Laboratory: Recent Labs  Lab 02/22/20 0249 02/23/20 0301 02/24/20 0249  WBC 5.6 7.7 6.3  HGB 9.4* 9.6* 9.0*  HCT 30.5* 31.0* 28.6*  PLT 103* 152 199   Recent Labs  Lab 02/21/20 1831 02/21/20 1945 02/22/20 0249 02/23/20 0301 02/24/20 0249  NA 133*   < > 139 142 143  K 4.1   < > 4.7 4.6 4.0  CL 105   < > 115* 113* 116*  CO2 14*   < > 16* 18* 20*  BUN 16   < > 13 11 12   CREATININE 1.27*   < > 0.98 0.75 0.78  CALCIUM 8.4*   < > 7.3* 8.4* 8.5*  PROT 6.5  --   --   --   --   BILITOT 1.5*  --   --   --   --   ALKPHOS 77  --   --   --   --   ALT 168*  --   --   --   --   AST 298*  --   --   --   --   GLUCOSE 153*   < > 124* 119* 92   < > = values in this interval not displayed.    Imaging/Diagnostic Tests: CT Head Wo Contrast  Result Date: 02/04/2020 CLINICAL DATA:  Acute pain due to trauma. EXAM: CT HEAD WITHOUT CONTRAST  TECHNIQUE: Contiguous axial images were obtained from the base of the skull through the vertex without intravenous contrast. COMPARISON:  MRI dated November 25, 2015. CT head dated September 21, 2015. FINDINGS: Brain: No evidence of acute infarction, hemorrhage, hydrocephalus, extra-axial collection or mass lesion/mass effect. Vascular: No hyperdense vessel or unexpected calcification. Skull: Normal. Negative for fracture or focal lesion. Sinuses/Orbits: There is pansinus mucosal thickening. The mastoid air cells are essentially clear. Other: None. IMPRESSION: 1. No acute intracranial abnormality. 2. Pansinus mucosal thickening. Electronically Signed   By: Constance Holster M.D.   On: 02/04/2020 17:52   DG Chest Port 1 View  Result Date: 02/21/2020 CLINICAL DATA:  Shortness of breath.  COVID positive. EXAM: PORTABLE CHEST 1 VIEW COMPARISON:  Feb 04, 2020 FINDINGS: There are hazy bilateral airspace opacities, greatest at the lung bases bilaterally. There is no pneumothorax. There are small bilateral pleural effusions, right greater than left. The cardiac silhouette appears enlarged but stable from prior study. There is no acute osseous abnormality. IMPRESSION: 1. Multifocal pneumonia (viral or bacterial). 2. Small bilateral pleural effusions, right greater than left. Electronically Signed   By: Constance Holster M.D.   On: 02/21/2020  19:09   DG Chest Port 1 View  Result Date: 02/04/2020 CLINICAL DATA:  COVID-19.  Cough.  Shortness of breath. EXAM: PORTABLE CHEST 1 VIEW COMPARISON:  11/06/2018 FINDINGS: The heart size and pulmonary vascularity are normal. Slight haziness at the left lung base laterally. Chronic scarring at the right lung base. No effusions. No bone abnormality of significance. IMPRESSION: Slight haziness at the left lung base laterally which could represent a small area of infiltrate. Scarring at the right lung base. Electronically Signed   By: Lorriane Shire M.D.   On: 02/04/2020 14:48     Lurline Del, DO 02/24/2020, 6:37 AM PGY-1, [place] Intern pager: [phone], text pages welcome

## 2020-02-24 NOTE — Progress Notes (Signed)
Went to evaluate patient tonight.  She was sitting in bed comfortably when I entered the room and the nurse tech was there.  Patient reports she is feeling okay and denies any problems with her breathing.  She has nasal cannula on with 1 L O2.  Her oxygen saturation is 94% and she is not tachypneic or have increased work of breathing.  Ask if she would like to take that out of her nose she reports yes.  On room air the patient's oxygen saturation ranges between 90 and 94.  She denies any shortness of breath.  I was notified by the nursing staff that at handoff she spiked a fever of 102.8.  She was given Tylenol and that her temperature had responded and was now 99.  We will continue to monitor and treat fever with Tylenol.  She is currently on broadened antibiotics given she fevered earlier in the day.  Per day team we may consider a CT chest if she continues to spike fevers while on broad-spectrum antibiotics but that will most likely be discussed during the day tomorrow.

## 2020-02-24 NOTE — Progress Notes (Signed)
Pt spiked fever 102.8 orally making MEWS RED. Was previously yellow. Dr made aware. New orders for chest xray, blood & urine cultures. Tylenol administered. Recheck @ 0900 Temp 100.1 orally. Will continue to monitor.    02/24/20 0751  Assess: MEWS Score  Temp (!) 102.8 F (39.3 C)  BP (!) 144/92  Pulse Rate (!) 124  Resp (!) 35  SpO2 90 %  O2 Device Room Air  Assess: MEWS Score  MEWS Temp 2  MEWS Systolic 0  MEWS Pulse 2  MEWS RR 2  MEWS LOC 0  MEWS Score 6  MEWS Score Color Red  Assess: if the MEWS score is Yellow or Red  Were vital signs taken at a resting state? Yes  Focused Assessment Documented focused assessment  Early Detection of Sepsis Score *See Row Information* High  MEWS guidelines implemented *See Row Information* Yes  Treat  MEWS Interventions Administered prn meds/treatments  Take Vital Signs  Increase Vital Sign Frequency  Red: Q 1hr X 4 then Q 4hr X 4, if remains red, continue Q 4hrs  Notify: Provider  Provider Name/Title Dr. Vanessa Kimballton  Date Provider Notified 02/24/20  Time Provider Notified 0800  Notification Type Page  Notification Reason Change in status (fever)  Response See new orders  Date of Provider Response 02/24/20  Time of Provider Response [id number]

## 2020-02-25 ENCOUNTER — Inpatient Hospital Stay (HOSPITAL_COMMUNITY): Payer: Medicare Other

## 2020-02-25 DIAGNOSIS — R509 Fever, unspecified: Secondary | ICD-10-CM

## 2020-02-25 DIAGNOSIS — M329 Systemic lupus erythematosus, unspecified: Secondary | ICD-10-CM

## 2020-02-25 DIAGNOSIS — J189 Pneumonia, unspecified organism: Secondary | ICD-10-CM

## 2020-02-25 LAB — RETICULOCYTES
Immature Retic Fract: 22.2 % — ABNORMAL HIGH (ref 2.3–15.9)
RBC.: 3.98 MIL/uL (ref 3.87–5.11)
Retic Count, Absolute: 22.3 10*3/uL (ref 19.0–186.0)
Retic Ct Pct: 0.6 % (ref 0.4–3.1)

## 2020-02-25 LAB — URINE CULTURE: Culture: NO GROWTH

## 2020-02-25 LAB — CBC
HCT: 26.2 % — ABNORMAL LOW (ref 36.0–46.0)
Hemoglobin: 8.2 g/dL — ABNORMAL LOW (ref 12.0–15.0)
MCH: 21.2 pg — ABNORMAL LOW (ref 26.0–34.0)
MCHC: 31.3 g/dL (ref 30.0–36.0)
MCV: 67.9 fL — ABNORMAL LOW (ref 80.0–100.0)
Platelets: 255 10*3/uL (ref 150–400)
RBC: 3.86 MIL/uL — ABNORMAL LOW (ref 3.87–5.11)
RDW: 15.9 % — ABNORMAL HIGH (ref 11.5–15.5)
WBC: 6.9 10*3/uL (ref 4.0–10.5)
nRBC: 0.6 % — ABNORMAL HIGH (ref 0.0–0.2)

## 2020-02-25 LAB — BASIC METABOLIC PANEL
Anion gap: 11 (ref 5–15)
BUN: 8 mg/dL (ref 6–20)
CO2: 19 mmol/L — ABNORMAL LOW (ref 22–32)
Calcium: 8.1 mg/dL — ABNORMAL LOW (ref 8.9–10.3)
Chloride: 109 mmol/L (ref 98–111)
Creatinine, Ser: 0.77 mg/dL (ref 0.44–1.00)
GFR calc Af Amer: >60 mL/min (ref >60)
GFR calc non Af Amer: >60 mL/min (ref >60)
Glucose, Bld: 94 mg/dL (ref 70–99)
Potassium: 3.5 mmol/L (ref 3.5–5.1)
Sodium: 139 mmol/L (ref 135–145)

## 2020-02-25 LAB — PROCALCITONIN: Procalcitonin: 7.16 ng/mL

## 2020-02-25 MED ORDER — SODIUM CHLORIDE 0.9% FLUSH
10.0000 mL | Freq: Two times a day (BID) | INTRAVENOUS | Status: DC
  Administered 2020-02-25 – 2020-03-02 (×10): 10 mL

## 2020-02-25 MED ORDER — IPRATROPIUM BROMIDE 0.06 % NA SOLN
2.0000 | Freq: Two times a day (BID) | NASAL | Status: DC
  Administered 2020-02-25 – 2020-03-03 (×16): 2 via NASAL
  Filled 2020-02-25: qty 15

## 2020-02-25 MED ORDER — SODIUM CHLORIDE 0.9% FLUSH: 10.0000 mL | INTRAVENOUS | Status: DC | PRN

## 2020-02-25 MED ORDER — IOHEXOL 300 MG/ML  SOLN
100.0000 mL | Freq: Once | INTRAMUSCULAR | Status: AC | PRN
  Administered 2020-02-25: 100 mL via INTRAVENOUS

## 2020-02-25 NOTE — Progress Notes (Signed)
PT Cancellation Note  Patient Details Name: Andrea Berry MRN: [id number] DOB: [birth year]   Cancelled Treatment:    Reason Eval/Treat Not Completed: Fatigue/lethargy limiting ability to participate.  Pt reports, " I don't feel good".  She does not want to walk or get up to the chair right now.  RN reports she was only in the chair an hour this morning.  PT to check back tomorrow.  Thanks,  Verdene Lennert, PT, DPT  Acute Rehabilitation [phone] pager #[phone] office       Barbarann Ehlers [place] Hamrick 02/25/2020, 2:21 PM

## 2020-02-25 NOTE — Progress Notes (Signed)
Occupational Therapy Treatment Patient Details Name: Andrea Berry MRN: [id number] DOB: May 23, 1966 Today's Date: 02/25/2020    History of present illness 54 y.o. female presented to ED 02/21/20 with shortness of breath 2/2 recent Covid infection (5/27) with hospitalization 5/27-6/1. PMH is significant for neuromyelitis optica/legal blindness, SLE, chronic interstitial lung disease,hx of PE, chronic central neuropathic pain, mild cognitive impairment, mood disorder, steroid-induced osteopenia, migraine without aura, HTN, CVA, transverse myelitis with chronic LUE, LLE weakness   OT comments  Patient in bed on arrival and requesting to use BSC.  She was able to don socks with set up at bed level, which she states is how she does it at home.  Required min assist with bed mobility and min guard to stand with 2 hand held assist.  Transferred to [facility] with min assist and completed toileting with min guard.  Patient on 1L O2 during session with SpO2 97 at rest.  With mobility patient's RR increased to 50 and HR to 130, SpO2 decreased to 88.  Needed consistent reminders to slow down breathing and practice pursed lip breathing.  Will continue to follow with OT acutely to address the deficits listed below.    Follow Up Recommendations  No OT follow up;Supervision - Intermittent    Equipment Recommendations  None recommended by OT    Recommendations for Other Services      Precautions / Restrictions Precautions Precautions: Fall Precaution Comments: legally blind Restrictions Weight Bearing Restrictions: No       Mobility Bed Mobility Overal bed mobility: Needs Assistance Bed Mobility: Supine to Sit     Supine to sit: Min assist        Transfers Overall transfer level: Needs assistance Equipment used: 2 person hand held assist Transfers: Sit to/from Stand;Stand Pivot Transfers Sit to Stand: Min guard Stand pivot transfers: Min assist            Balance Overall balance assessment:  Needs assistance Sitting-balance support: No upper extremity supported;Feet unsupported Sitting balance-Leahy Scale: Fair     Standing balance support: Bilateral upper extremity supported Standing balance-Leahy Scale: Poor                             ADL either performed or assessed with clinical judgement   ADL Overall ADL's : Needs assistance/impaired                     Lower Body Dressing: Set up;Bed level   Toilet Transfer: Minimal assistance;BSC   Toileting- Clothing Manipulation and Hygiene: Min guard;Sit to/from stand       Functional mobility during ADLs: Minimal assistance       Vision       Perception     Praxis      Cognition Arousal/Alertness: Awake/alert Behavior During Therapy: Flat affect Overall Cognitive Status: Within Functional Limits for tasks assessed                                          Exercises     Shoulder Instructions       General Comments      Pertinent Vitals/ Pain       Pain Assessment: No/denies pain  Home Living  Prior Functioning/Environment              Frequency  Min 2X/week        Progress Toward Goals  OT Goals(current goals can now be found in the care plan section)  Progress towards OT goals: Progressing toward goals  Acute Rehab OT Goals Patient Stated Goal: to get better and get back home OT Goal Formulation: With patient Time For Goal Achievement: 03/07/20 Potential to Achieve Goals: Good  Plan Discharge plan remains appropriate    Co-evaluation                 AM-PAC OT "6 Clicks" Daily Activity     Outcome Measure   Help from another person eating meals?: A Little Help from another person taking care of personal grooming?: A Little Help from another person toileting, which includes using toliet, bedpan, or urinal?: A Little Help from another person bathing (including washing,  rinsing, drying)?: A Little Help from another person to put on and taking off regular upper body clothing?: A Little Help from another person to put on and taking off regular lower body clothing?: A Little 6 Click Score: 18    End of Session Equipment Utilized During Treatment: Oxygen  OT Visit Diagnosis: Unsteadiness on feet (R26.81);Other abnormalities of gait and mobility (R26.89);Muscle weakness (generalized) (M62.81)   Activity Tolerance Patient tolerated treatment well   Patient Left in chair;with call bell/phone within reach   Nurse Communication Mobility status        Time: 7048-8891 OT Time Calculation (min): 19 min  Charges: OT General Charges $OT Visit: 1 Visit OT Treatments $Self Care/Home Management : 8-22 mins  August Luz, OTR/L    Phylliss Bob 02/25/2020, 3:16 PM

## 2020-02-25 NOTE — Progress Notes (Signed)
Family Medicine Teaching Service Daily Progress Note Intern Pager: [phone]  Patient name: Andrea Berry Medical record number: [id number] Date of birth: 1966/01/28 Age: 54 y.o. Gender: female  Primary Care Provider: McDiarmid, Blane Ohara, MD Consultants: None Code Status: Full  Pt Overview and Major Events to Date:  6/13 - admitted  Assessment and Plan: Andrea Berry is a 54 y.o. female presenting with shortness of breath 2/2 recent Covid infection with hospitalization. PMH is significant for legal blindness, SLE, chronic interstitial lung disease,hx of PE on Xarelto, chronic central neuropathic pain, mild cognitive impairment, mood disorder, neuromyelitis optica, steroid-induced osteopenia, migraine without aura.  Acute respiratory distress after recent COVID-19 diagnosis/hospitalization with new fever 6/16. Diagnosed with COVID-19 on 5/27.  Status post remdesivir and Decadron.  Patient with fever overnight with T-max of 101.3. Patient sputum cultures grew moderate Haemophilus influenza, urine cultures with less than 10,000 colonies and insignificant growth. Sputum culture shows rare gram-positive cocci in clusters.  Blood cultures negative at 3 days.  Of note patient was MRSA PCR negative on admission. Unsure etiology for new fever. Patient did not have coverage for Pseudomonas other than her initial antibiotics on presentation, however I would have expected her to worsen before now if this was present. Patient's urine has been negative patient without any new complaints suggestive of other etiologies for new infection.  Patient's current white blood cell count 6.9. -Continue Vanc and cefepime -Continuous cardiac monitoring -Continuous pulse ox -Consider discontinuing Covid precautions tomorrow as will be day 22 since testing positive. -We will consider chest CT to look for any signs of loculated effusion -We will discontinue patient's home Zanaflex  Anemia Patient CBC had a hemoglobin  dropped to 8.2 today from 10.5 on 6/13.  Patient not endorsing any sources of bleeding.  MCV at 67.9. -We will check C3, C4, double-stranded DNA as well as reticulocyte and iron panel  AKI -  resolved Creatinine on arrival 1.27>0.77 currently.  Creatinine at discharge on 6/1 was 0.87.  Most likely perfusion related. -Monitor vital signs -Avoid nephrotoxic agents  Neuromyelitis optica Patient is legally blind Currently following with ophthalmology.  No acute changes in vision -Continue home ophthalmic solution  SLE Follows with [facility] rheumatology.  She was on hydroxychloroquine but is not taking it per her ophthalmologist request.  Home medication includes prednisone 5 mg daily and she receives rituximab infusions every 6 months.  While hospitalized for Covid 2 weeks ago she received a course of Decadron. -Restarted patient's home prednisone after discontinued Decadron  History of PE PE occurred in 2017.  Home medications include Xarelto 10 mg daily.  Patient negative for antiphospholipid syndrome -Continue home Xarelto  History of transverse myelitis, Devic's disease Patient pain developed in 2017.  Chronic left upper and lower extremity weakness.  Home medications include tramadol, Neurontin, Lyrica, Trileptal. -Continue home schedule Trileptal and Lyrica -On tramadol as needed  Chronic interstitial lung disease Patient sees pulmonology as well as asthma and allergy.  PFT on 11/24/2018 unremarkable.  Per chart review from previous admission the patient had PFT performed December 2020 which they were unable to find records for.  Home medication includes Flonase nasal spray.  Insomnia Home medication includes Ativan 0.5 mg nightly. -Melatonin as needed  Prednisone-induced osteoporosis Patient is on alendronate diet at home, is supposed to be on 2-year holiday given longstanding use  Mood disorder Home medication includes Cymbalta 60 mg daily -Continue home  Cymbalta  Hypertension Most recent blood pressure 127/67.  Patient was previously  recommended to take Norvasc by cardiology but it does not appear that she is on any antihypertensive at this time. -Continue to monitor  Aortic regurgitation Patient had evidence on TEE with no evidence of structural changes.  No treatment necessary per cardiology.  History of CVA Patient had CVA in 2008.  Home medications include Xarelto -Continue home Xarelto, no antiplatelets  FEN/GI: regular diet PPx: Lovenox  Disposition: Due to new fevers we will continue to monitor patient for an additional 24-48 hours, after this and pending results of new cultures can consider discharge patient in 48 to 72 hours from now on oral antibiotics if doing well.  Subjective:  Patient states she feels somewhat better than yesterday but still not great overall. States she had a little bit of a low oxygen level when nursing staff was working with her IV earlier and so was placed on 1 L nasal cannula. In the room removed patient's nasal cannula to observe her O2 status and noted her to desat into the upper 80s multiple times and become tachypneic into the upper 30s and low 40s. This resolved when placing the nasal cannula back in place.  Objective: Temp:  [98.5 F (36.9 C)-101.3 F (38.5 C)] 99 F (37.2 C) (06/17 0737) Pulse Rate:  [97-120] 107 (06/17 0737) Resp:  [21-36] 32 (06/17 0737) BP: (118-141)/(65-81) 127/67 (06/17 0737) SpO2:  [90 %-97 %] 93 % (06/17 0737) Physical Exam: General: Alert and oriented in no apparent distress Heart: Regular rate and rhythm with no murmurs appreciated Lungs: Bibasilar crackles present, worse on the left, very faint end expiratory wheezing present. Nasal cannula in place Extremities: No lower extremity edema  Laboratory: Recent Labs  Lab 02/23/20 0301 02/24/20 0249 02/25/20 0449  WBC 7.7 6.3 6.9  HGB 9.6* 9.0* 8.2*  HCT 31.0* 28.6* 26.2*  PLT 152 199 255   Recent Labs   Lab 02/21/20 1831 02/21/20 1945 02/23/20 0301 02/24/20 0249 02/25/20 0449  NA 133*   < > 142 143 139  K 4.1   < > 4.6 4.0 3.5  CL 105   < > 113* 116* 109  CO2 14*   < > 18* 20* 19*  BUN 16   < > 11 12 8   CREATININE 1.27*   < > 0.75 0.78 0.77  CALCIUM 8.4*   < > 8.4* 8.5* 8.1*  PROT 6.5  --   --   --   --   BILITOT 1.5*  --   --   --   --   ALKPHOS 77  --   --   --   --   ALT 168*  --   --   --   --   AST 298*  --   --   --   --   GLUCOSE 153*   < > 119* 92 94   < > = values in this interval not displayed.    Imaging/Diagnostic Tests: CT Head Wo Contrast  Result Date: 02/04/2020 CLINICAL DATA:  Acute pain due to trauma. EXAM: CT HEAD WITHOUT CONTRAST TECHNIQUE: Contiguous axial images were obtained from the base of the skull through the vertex without intravenous contrast. COMPARISON:  MRI dated November 25, 2015. CT head dated September 21, 2015. FINDINGS: Brain: No evidence of acute infarction, hemorrhage, hydrocephalus, extra-axial collection or mass lesion/mass effect. Vascular: No hyperdense vessel or unexpected calcification. Skull: Normal. Negative for fracture or focal lesion. Sinuses/Orbits: There is pansinus mucosal thickening. The mastoid air cells are essentially clear. Other:  None. IMPRESSION: 1. No acute intracranial abnormality. 2. Pansinus mucosal thickening. Electronically Signed   By: Constance Holster M.D.   On: 02/04/2020 17:52   DG CHEST PORT 1 VIEW  Result Date: 02/24/2020 CLINICAL DATA:  Fever EXAM: PORTABLE CHEST 1 VIEW COMPARISON:  February 21, 2020 FINDINGS: There is airspace opacity throughout the right mid and lower lung regions as well as in the left mid lung and left base regions. There is atelectatic change in the medial right base. There is a probable small left pleural effusion. Heart is slightly enlarged with pulmonary vascularity normal. No adenopathy. No bone lesions. IMPRESSION: Multifocal pneumonia, slightly increased in the left mid lung and stable  elsewhere. Probable small left pleural effusion. Medial right base atelectasis. Stable cardiac prominence.  No adenopathy evident. Electronically Signed   By: Lowella Grip III M.D.   On: 02/24/2020 10:20   DG Chest Port 1 View  Result Date: 02/21/2020 CLINICAL DATA:  Shortness of breath.  COVID positive. EXAM: PORTABLE CHEST 1 VIEW COMPARISON:  Feb 04, 2020 FINDINGS: There are hazy bilateral airspace opacities, greatest at the lung bases bilaterally. There is no pneumothorax. There are small bilateral pleural effusions, right greater than left. The cardiac silhouette appears enlarged but stable from prior study. There is no acute osseous abnormality. IMPRESSION: 1. Multifocal pneumonia (viral or bacterial). 2. Small bilateral pleural effusions, right greater than left. Electronically Signed   By: Constance Holster M.D.   On: 02/21/2020 19:09   DG Chest Port 1 View  Result Date: 02/04/2020 CLINICAL DATA:  COVID-19.  Cough.  Shortness of breath. EXAM: PORTABLE CHEST 1 VIEW COMPARISON:  11/06/2018 FINDINGS: The heart size and pulmonary vascularity are normal. Slight haziness at the left lung base laterally. Chronic scarring at the right lung base. No effusions. No bone abnormality of significance. IMPRESSION: Slight haziness at the left lung base laterally which could represent a small area of infiltrate. Scarring at the right lung base. Electronically Signed   By: Lorriane Shire M.D.   On: 02/04/2020 14:48    Lurline Del, DO 02/25/2020, 11:37 AM PGY-1, [place] Intern pager: [phone], text pages welcome

## 2020-02-26 DIAGNOSIS — J14 Pneumonia due to Hemophilus influenzae: Secondary | ICD-10-CM

## 2020-02-26 DIAGNOSIS — D509 Iron deficiency anemia, unspecified: Secondary | ICD-10-CM

## 2020-02-26 DIAGNOSIS — R509 Fever, unspecified: Secondary | ICD-10-CM | POA: Diagnosis present

## 2020-02-26 DIAGNOSIS — G8929 Other chronic pain: Secondary | ICD-10-CM

## 2020-02-26 DIAGNOSIS — Z8616 Personal history of COVID-19: Secondary | ICD-10-CM

## 2020-02-26 DIAGNOSIS — M792 Neuralgia and neuritis, unspecified: Secondary | ICD-10-CM

## 2020-02-26 HISTORY — DX: Iron deficiency anemia, unspecified: D50.9

## 2020-02-26 LAB — CBC
HCT: 29.3 % — ABNORMAL LOW (ref 36.0–46.0)
Hemoglobin: 9.1 g/dL — ABNORMAL LOW (ref 12.0–15.0)
MCH: 21.6 pg — ABNORMAL LOW (ref 26.0–34.0)
MCHC: 31.1 g/dL (ref 30.0–36.0)
MCV: 69.6 fL — ABNORMAL LOW (ref 80.0–100.0)
Platelets: 293 10*3/uL (ref 150–400)
RBC: 4.21 MIL/uL (ref 3.87–5.11)
RDW: 16.5 % — ABNORMAL HIGH (ref 11.5–15.5)
WBC: 7.2 10*3/uL (ref 4.0–10.5)
nRBC: 0.4 % — ABNORMAL HIGH (ref 0.0–0.2)

## 2020-02-26 LAB — BASIC METABOLIC PANEL
Anion gap: 12 (ref 5–15)
BUN: 9 mg/dL (ref 6–20)
CO2: 17 mmol/L — ABNORMAL LOW (ref 22–32)
Calcium: 8.1 mg/dL — ABNORMAL LOW (ref 8.9–10.3)
Chloride: 106 mmol/L (ref 98–111)
Creatinine, Ser: 0.77 mg/dL (ref 0.44–1.00)
GFR calc Af Amer: >60 mL/min (ref >60)
GFR calc non Af Amer: >60 mL/min (ref >60)
Glucose, Bld: 87 mg/dL (ref 70–99)
Potassium: 3.5 mmol/L (ref 3.5–5.1)
Sodium: 135 mmol/L (ref 135–145)

## 2020-02-26 LAB — CULTURE, BLOOD (ROUTINE X 2)
Culture: NO GROWTH
Culture: NO GROWTH
Special Requests: ADEQUATE

## 2020-02-26 LAB — C3 COMPLEMENT: C3 Complement: 165 mg/dL (ref 82–167)

## 2020-02-26 LAB — FOLATE: Folate: 6.8 ng/mL (ref >5.9)

## 2020-02-26 LAB — VITAMIN B12: Vitamin B-12: 1062 pg/mL — ABNORMAL HIGH (ref 180–914)

## 2020-02-26 LAB — IRON AND TIBC
Iron: 6 ug/dL — ABNORMAL LOW (ref 28–170)
Saturation Ratios: 3 % — ABNORMAL LOW (ref 10.4–31.8)
TIBC: 196 ug/dL — ABNORMAL LOW (ref 250–450)
UIBC: 190 ug/dL

## 2020-02-26 LAB — FERRITIN: Ferritin: 942 ng/mL — ABNORMAL HIGH (ref 11–307)

## 2020-02-26 LAB — C4 COMPLEMENT: Complement C4, Body Fluid: 52 mg/dL — ABNORMAL HIGH (ref 12–38)

## 2020-02-26 LAB — ANTI-DNA ANTIBODY, DOUBLE-STRANDED: ds DNA Ab: <1 IU/mL (ref 0–9)

## 2020-02-26 MED ORDER — SODIUM CHLORIDE 0.9 % IV SOLN
1.0000 g | INTRAVENOUS | Status: DC
  Administered 2020-02-26: 1 g via INTRAVENOUS
  Filled 2020-02-26: qty 10

## 2020-02-26 MED ORDER — POLYETHYLENE GLYCOL 3350 17 G PO PACK
17.0000 g | PACK | Freq: Two times a day (BID) | ORAL | Status: DC
  Administered 2020-02-26 – 2020-03-03 (×13): 17 g via ORAL
  Filled 2020-02-26 (×13): qty 1

## 2020-02-26 MED ORDER — ONDANSETRON HCL 4 MG/2ML IJ SOLN
4.0000 mg | Freq: Three times a day (TID) | INTRAMUSCULAR | Status: DC | PRN
  Administered 2020-02-29 – 2020-03-02 (×2): 4 mg via INTRAVENOUS
  Filled 2020-02-26 (×2): qty 2

## 2020-02-26 NOTE — Progress Notes (Signed)
Family Medicine Teaching Service Daily Progress Note Intern Pager: [phone]  Patient name: Andrea Berry Medical record number: [id number] Date of birth: [birth year] Age: 54 y.o. Gender: female  Primary Care Provider: McDiarmid, Blane Ohara, MD Consultants: None Code Status: Full  Pt Overview and Major Events to Date:  6/13 - admitted  Assessment and Plan: Andrea Berry is a 54 y.o. female presenting with shortness of breath 2/2 recent Covid infection with hospitalization. PMH is significant for legal blindness, SLE, chronic interstitial lung disease,hx of PE on Xarelto, chronic central neuropathic pain, mild cognitive impairment, mood disorder, neuromyelitis optica, steroid-induced osteopenia, migraine without aura.  Acute respiratory distress after recent COVID-19 diagnosis/hospitalization with new fever 6/16. Diagnosed with COVID-19 on 5/27.  Status post remdesivir and Decadron.  Patient with fever overnight with T-max of 101.3. Patient sputum cultures grew moderate Haemophilus influenza, urine cultures with less than 10,000 colonies and insignificant growth. Sputum culture shows rare gram-positive cocci in clusters.  Blood cultures negative at 3 days.  Of note patient was MRSA PCR negative on admission. Unsure etiology for new fever. Patient did not have coverage for Pseudomonas other than her initial antibiotics on presentation, however I would have expected her to worsen before now if this was present. Patient's urine has been negative patient without any new complaints suggestive of other etiologies for new infection.  Chest CT showed multifocal bilateral pneumonia with no significant change since previous x-ray, trace bilateral pleural effusions with minimal dependent lower lobe atelectasis.  WBC 7.2.  Patient with several more fevers overnight with a T-max of 103.  Repeat urine culture showed no growth.  Initial blood cultures are no growth at 4 days.  Initial sputum culture showed moderate  gram-negative rods, rare gram-positive cocci in clusters and moderate Haemophilus influenza. -Continuous pulse ox/cardiac monitoring -Infectious disease consult, appreciate recommendations -Per infectious disease will reduce antibiotics to ceftriaxone and continue to monitor.  Anemia Patient CBC had a hemoglobin dropped to 8.2 today from 10.5 on 6/13.  Current hemoglobin 9.2.  Patient not endorsing any sources of bleeding.  MCV at 69.6.  Iron panel shows iron reduced at 6, saturation ratio reduced at 3, ferritin increased at 942 suggestive of anemia of chronic disease.  Patient's ferritin can also be increased as an acute phase reactant in the setting of inflammation however with the patient's history of lupus anemia of chronic disease seems most consistent. -C3 and C4 not decreased which suggest against a lupus flare.  Double-stranded DNA pending  AKI -  resolved Creatinine on arrival 1.27>0.77 currently.  Creatinine at discharge on 6/1 was 0.87.  Most likely perfusion related. -Monitor vital signs -Avoid nephrotoxic agents  Neuromyelitis optica Patient is legally blind Currently following with ophthalmology.  No acute changes in vision -Continue home ophthalmic solution  SLE Follows with [facility] rheumatology.  She was on hydroxychloroquine but is not taking it per her ophthalmologist request.  Home medication includes prednisone 5 mg daily and she receives rituximab infusions every 6 months.  While hospitalized for Covid 2 weeks ago she received a course of Decadron. -Restarted patient's home prednisone after discontinued Decadron  History of PE PE occurred in 2017.  Home medications include Xarelto 10 mg daily.  Patient negative for antiphospholipid syndrome -Continue home Xarelto  History of transverse myelitis, Devic's disease Patient pain developed in 2017.  Chronic left upper and lower extremity weakness.  Home medications include tramadol, Neurontin, Lyrica,  Trileptal. -Continue home schedule Trileptal and Lyrica -On tramadol as needed  Chronic interstitial lung disease Patient sees pulmonology as well as asthma and allergy.  PFT on 11/24/2018 unremarkable.  Per chart review from previous admission the patient had PFT performed December 2020 which they were unable to find records for.  Home medication includes Flonase nasal spray.  Insomnia Home medication includes Ativan 0.5 mg nightly. -Melatonin as needed  Prednisone-induced osteoporosis Patient is on alendronate diet at home, is supposed to be on 2-year holiday given longstanding use  Mood disorder Home medication includes Cymbalta 60 mg daily -Continue home Cymbalta  Hypertension Most recent blood pressure 138/75.  Patient was previously recommended to take Norvasc by cardiology but it does not appear that she is on any antihypertensive at this time. -Continue to monitor  Aortic regurgitation Patient had evidence on TEE with no evidence of structural changes.  No treatment necessary per cardiology.  History of CVA Patient had CVA in 2008.  Home medications include Xarelto -Continue home Xarelto, no antiplatelets  FEN/GI: regular diet PPx: Lovenox  Disposition: Pending clinical improvement and infectious disease recommendations  Subjective:  Patient continues to appear unwell.  States she thinks that she feels a little bit better than yesterday though she looks fatigued.  Patient denies urinary symptoms, diarrhea, vomiting.  She does state that she thinks her breathing is a little bit better than it was yesterday.  Objective: Temp:  [98.1 F (36.7 C)-103 F (39.4 C)] 98.7 F (37.1 C) (06/18 0557) Pulse Rate:  [94-122] 115 (06/18 0557) Resp:  [22-32] 26 (06/18 0557) BP: (127-159)/(55-86) 143/78 (06/18 0557) SpO2:  [76 %-100 %] 100 % (06/18 0557) Physical Exam: General: Appears unwell, no acute distress Heart: Tachycardic rate, regular rhythm, no murmurs  noted Lungs: Crackles present bilaterally in the bases, do appear to be somewhat improved from yesterday, 1 L nasal cannula present Abdomen: Bowel sounds present, no abdominal pain on palpation Skin: Warm and dry  Laboratory: Recent Labs  Lab 02/24/20 0249 02/25/20 0449 02/26/20 0357  WBC 6.3 6.9 7.2  HGB 9.0* 8.2* 9.1*  HCT 28.6* 26.2* 29.3*  PLT 199 255 293   Recent Labs  Lab 02/21/20 1831 02/21/20 1945 02/24/20 0249 02/25/20 0449 02/26/20 0357  NA 133*   < > 143 139 135  K 4.1   < > 4.0 3.5 3.5  CL 105   < > 116* 109 106  CO2 14*   < > 20* 19* 17*  BUN 16   < > 12 8 9   CREATININE 1.27*   < > 0.78 0.77 0.77  CALCIUM 8.4*   < > 8.5* 8.1* 8.1*  PROT 6.5  --   --   --   --   BILITOT 1.5*  --   --   --   --   ALKPHOS 77  --   --   --   --   ALT 168*  --   --   --   --   AST 298*  --   --   --   --   GLUCOSE 153*   < > 92 94 87   < > = values in this interval not displayed.    Imaging/Diagnostic Tests: CT Head Wo Contrast  Result Date: 02/04/2020 CLINICAL DATA:  Acute pain due to trauma. EXAM: CT HEAD WITHOUT CONTRAST TECHNIQUE: Contiguous axial images were obtained from the base of the skull through the vertex without intravenous contrast. COMPARISON:  MRI dated November 25, 2015. CT head dated September 21, 2015. FINDINGS: Brain: No evidence of  acute infarction, hemorrhage, hydrocephalus, extra-axial collection or mass lesion/mass effect. Vascular: No hyperdense vessel or unexpected calcification. Skull: Normal. Negative for fracture or focal lesion. Sinuses/Orbits: There is pansinus mucosal thickening. The mastoid air cells are essentially clear. Other: None. IMPRESSION: 1. No acute intracranial abnormality. 2. Pansinus mucosal thickening. Electronically Signed   By: Constance Holster M.D.   On: 02/04/2020 17:52   CT CHEST W CONTRAST  Result Date: 02/25/2020 CLINICAL DATA:  Pneumonia EXAM: CT CHEST WITH CONTRAST TECHNIQUE: Multidetector CT imaging of the chest was performed  during intravenous contrast administration. CONTRAST:  142mL OMNIPAQUE IOHEXOL 300 MG/ML  SOLN COMPARISON:  02/24/2020 FINDINGS: Cardiovascular: Heart is unremarkable without pericardial effusion. Normal caliber of the thoracic aorta. No significant atherosclerosis. Mediastinum/Nodes: No enlarged mediastinal, hilar, or axillary lymph nodes. Thyroid gland, trachea, and esophagus demonstrate no significant findings. Lungs/Pleura: There is multifocal bilateral airspace disease, most pronounced within the left upper lobe. Trace bilateral pleural effusions are noted. Minimal dependent lower lobe atelectasis. No pneumothorax. Central airways are patent. Upper Abdomen: No acute abnormality. Musculoskeletal: No acute or destructive bony lesions. Reconstructed images demonstrate no additional findings. IMPRESSION: 1. Multifocal bilateral pneumonia, not significantly changed since previous chest x-ray. 2. Trace bilateral pleural effusions and minimal dependent lower lobe atelectasis. Electronically Signed   By: Randa Ngo M.D.   On: 02/25/2020 23:57   DG CHEST PORT 1 VIEW  Result Date: 02/24/2020 CLINICAL DATA:  Fever EXAM: PORTABLE CHEST 1 VIEW COMPARISON:  February 21, 2020 FINDINGS: There is airspace opacity throughout the right mid and lower lung regions as well as in the left mid lung and left base regions. There is atelectatic change in the medial right base. There is a probable small left pleural effusion. Heart is slightly enlarged with pulmonary vascularity normal. No adenopathy. No bone lesions. IMPRESSION: Multifocal pneumonia, slightly increased in the left mid lung and stable elsewhere. Probable small left pleural effusion. Medial right base atelectasis. Stable cardiac prominence.  No adenopathy evident. Electronically Signed   By: Lowella Grip III M.D.   On: 02/24/2020 10:20   DG Chest Port 1 View  Result Date: 02/21/2020 CLINICAL DATA:  Shortness of breath.  COVID positive. EXAM: PORTABLE CHEST 1  VIEW COMPARISON:  Feb 04, 2020 FINDINGS: There are hazy bilateral airspace opacities, greatest at the lung bases bilaterally. There is no pneumothorax. There are small bilateral pleural effusions, right greater than left. The cardiac silhouette appears enlarged but stable from prior study. There is no acute osseous abnormality. IMPRESSION: 1. Multifocal pneumonia (viral or bacterial). 2. Small bilateral pleural effusions, right greater than left. Electronically Signed   By: Constance Holster M.D.   On: 02/21/2020 19:09   DG Chest Port 1 View  Result Date: 02/04/2020 CLINICAL DATA:  COVID-19.  Cough.  Shortness of breath. EXAM: PORTABLE CHEST 1 VIEW COMPARISON:  11/06/2018 FINDINGS: The heart size and pulmonary vascularity are normal. Slight haziness at the left lung base laterally. Chronic scarring at the right lung base. No effusions. No bone abnormality of significance. IMPRESSION: Slight haziness at the left lung base laterally which could represent a small area of infiltrate. Scarring at the right lung base. Electronically Signed   By: Lorriane Shire M.D.   On: 02/04/2020 14:48    Lurline Del, DO 02/26/2020, 6:10 AM PGY-1, [facility] Intern pager: [phone], text pages welcome

## 2020-02-26 NOTE — Progress Notes (Signed)
Dr. Owens Shark informed pt's last T 100.0 at 1119 and will be rechecked in 30 min. To see if pt. Is spiking fever again.   Pt T 100.6 at 1150. Will administer tylenol.

## 2020-02-26 NOTE — Progress Notes (Signed)
Patient spiked a fever, which caused for her MEWS to turn red. Patient has been in the yellow. Focused assessment & tylenol given.    02/26/20 0434  Assess: MEWS Score  Temp (!) 102.8 F (39.3 C)  BP 138/70  Pulse Rate (!) 122  ECG Heart Rate (!) 122  Resp (!) 22  SpO2 99 %  Assess: MEWS Score  MEWS Temp 2  MEWS Systolic 0  MEWS Pulse 2  MEWS RR 1  MEWS LOC 0  MEWS Score 5  MEWS Score Color Red  Assess: if the MEWS score is Yellow or Red  Focused Assessment Documented focused assessment  Early Detection of Sepsis Score *See Row Information* High  MEWS guidelines implemented *See Row Information* Yes  Treat  MEWS Interventions Administered scheduled meds/treatments  Take Vital Signs  Increase Vital Sign Frequency  Red: Q 1hr X 4 then Q 4hr X 4, if remains red, continue Q 4hrs  Escalate  MEWS: Escalate Red: discuss with charge nurse/RN and provider, consider discussing with RRT  Notify: Charge Nurse/RN  Name of Charge Nurse/RN Notified Ronalee Belts RN  Date Charge Nurse/RN Notified 02/26/20  Time Charge Nurse/RN Notified 0434  Document  Progress note created (see row info) Yes

## 2020-02-26 NOTE — Progress Notes (Signed)
PT Cancellation Note  Patient Details Name: GLENNYS SCHORSCH MRN: [id number] DOB: [birth year]   Cancelled Treatment:    Reason Eval/Treat Not Completed: Patient declined, no reason specified.  Pt continues to report she does not feel well enough to participate in PT, even EOB or OOB to the chair.   Thanks,  Verdene Lennert, PT, DPT  Acute Rehabilitation [phone] pager #[phone] office       Wells Guiles B Masyn Fullam 02/26/2020, 3:27 PM

## 2020-02-26 NOTE — Consult Note (Signed)
[facility] for Infectious Disease    Date of Admission:  02/21/2020   Total days of antibiotics 6        Day 3 vancomycin        Day 3 cefepime               Reason for Consult: Persistent fever    Referring Provider: Dr. Dorris Singh  Assessment: I suspect that her persistent fever is due to a combination of Haemophilus influenza pneumonia superimposed upon COVID-19 infection with a persistent inflammatory response.  She is feeling much better and her oxygen requirements are close to her normal baseline.  Given her underlying lupus and interstitial lung disease I expect that her convalescence will be slow.  With her immunosuppression she may be still shedding virus so I will continue isolation for now. I am comfortable narrowing antibiotic therapy back to ceftriaxone for a few more days.  Persistent fever has been seen in some patients with Covid.  I would not be too concerned as long as she is clinically improved.  Plan: 1. Change antibiotic therapy back to ceftriaxone alone 2. Continue isolation  Principal Problem:   Fever Active Problems:   Pneumonia   History of COVID-19   Legal blindness   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HCC)   Chronic central neuropathic pain   Mild cognitive impairment   SLE (systemic lupus erythematosus related syndrome) (HCC)   History of pulmonary embolism   ILD (interstitial lung disease) (HCC)   Microcytic anemia   Scheduled Meds: . DULoxetine  60 mg Oral Daily  . ipratropium  2 spray Each Nare Q12H  . ketotifen  1 drop Both Eyes BID  . linaclotide  145 mcg Oral QAC breakfast  . mouth rinse  15 mL Mouth Rinse BID  . pantoprazole  40 mg Oral Daily  . predniSONE  5 mg Oral Q breakfast  . pregabalin  200 mg Oral TID  . rivaroxaban  10 mg Oral Daily  . sodium chloride flush  10-40 mL Intracatheter Q12H   Continuous Infusions: PRN Meds:.acetaminophen, famotidine, fluticasone, sodium chloride flush  HPI: Andrea Berry is a 54 y.o.  female with a history of systemic lupus and interstitial lung disease.  She is fully vaccinated against Covid but was admitted with worsening shortness of breath and found to have Covid pneumonia on 02/04/2020.  She received 5 days of remdesivir and was discharged on 02/09/2020 to complete 4 more days of oral dexamethasone.  On discharge she was on her baseline 2 L of nasal prong oxygen.  She completed therapy and then began to have increased fatigue and shortness of breath leading to readmission on 02/21/2020.  Her oxygen saturation was 89% on room air and she was febrile.  Chest x-ray showed bibasilar infiltrates not too much different from previous films.  She was started on ceftriaxone and azithromycin but has continued to have fever.  Therapy was broadened to vancomycin and cefepime.  Blood cultures are negative.  MRSA screen is negative.  Sputum culture grew Haemophilus influenza.  She still has fevers but says that she is feeling dramatically better.  Her oxygen saturation today is 100% on 2 L of nasal prong oxygen at rest.   Review of Systems: Review of Systems  Constitutional: Positive for fever and malaise/fatigue. Negative for chills and diaphoresis.  Respiratory: Positive for cough, sputum production and shortness of breath. Negative for hemoptysis and wheezing.   Cardiovascular: Negative for chest  pain.  Gastrointestinal: Negative for abdominal pain, diarrhea, nausea and vomiting.  Genitourinary: Negative for dysuria.  Musculoskeletal: Negative for back pain, joint pain and myalgias.  Skin: Negative for itching and rash.  Neurological: Negative for headaches.    Past Medical History:  Diagnosis Date  . Anemia of chronic disease 10/28/2015  . Anxiety 07/12/2018  . Arthritis   . Ataxia 01/03/2016  . Blind   . Chronic central neuropathic pain 10/02/2018   [facility]2017  . Chronic coughing 09/10/2016  . Chronic pain syndrome 07/12/2018  . Decreased strength 10/08/2018    Proximal leg muscles  . Depression   . Depression with anxiety 01/03/2016  . Devic's disease ([place])   . Dysesthesia 01/03/2016  . Dyspnea on exertion 11/07/2018  . Eustachian tube dysfunction 11/18/2018   L>R. Followed by [facility].  . Eustachian tube dysfunction, bilateral 09/18/2018  . GERD (gastroesophageal reflux disease)   . Headache(784.0)   . History of chicken pox 10/08/2018  . History of CVA (cerebrovascular accident)   . History of kidney stones   . Hypertension   . Hyponatremia 11/25/2015  . Impairment of balance 10/08/2018  . Legal blindness 01/30/2014  . Long-term corticosteroid use 10/08/2018  . Lupus ([place])   . Lupus anticoagulant disorder ([place]) 10/02/2018   Pulmonary Embolism 2017  . Lupus cerebritis ([place]) 11/25/2015   Possible explanation of 11/25/2015 acute hyperintensity findings on Brain MRI.   Marland Kitchen 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[place]) 01/03/2016  . Orthostatic dizziness, Recurrent 10/08/2018   Associated with medications  . Otalgia 07/12/2018  . Personal history of immunosupression therapy 10/08/2018   Chronic Azathioprine and prednisone therapy for Devic's disease  . Pigmented skin lesion of uncertain nature 7/82/9562  . Shortness of breath    due to medication  . Skin lesion of chest wall 11/07/2018  . SLE (systemic lupus erythematosus related syndrome) ([place]) 11/07/2018  . Smith antibody positive 04/07/2019  . Stroke [facility]) 2008   visually impaired  . Vitamin D deficiency 10/06/2018    Social History   Tobacco Use  . Smoking status: Never Smoker  . Smokeless tobacco: Never Used  Vaping Use  . Vaping Use: Never used  Substance Use Topics  . Alcohol use: No  . Drug use: No    Family History  Problem Relation Age of Onset  . Cancer Mother   . Cancer Father   . Lupus Sister    No Known Allergies  OBJECTIVE: Blood pressure 138/75, pulse (!) 113, temperature 98.6 F (37 C), temperature source Oral, resp. rate (!) 27, height 5\' 1"  (1.549 m), weight 88.3 kg, SpO2  93 %.  Physical Exam Constitutional:      Comments: She is resting quietly in bed.  She is pleasant and in no distress.  Cardiovascular:     Rate and Rhythm: Regular rhythm. Tachycardia present.     Heart sounds: No murmur heard.   Pulmonary:     Effort: Pulmonary effort is normal.     Breath sounds: Rales present. No wheezing or rhonchi.  Abdominal:     Palpations: Abdomen is soft.     Tenderness: There is no abdominal tenderness.  Musculoskeletal:        General: No swelling or tenderness.  Skin:    Findings: No rash.     Comments: Left arm IV site looks good.  Neurological:     General: No focal deficit present.  Psychiatric:        Mood and Affect: Mood  normal.     Lab Results Lab Results  Component Value Date   WBC 7.2 02/26/2020   HGB 9.1 (L) 02/26/2020   HCT 29.3 (L) 02/26/2020   MCV 69.6 (L) 02/26/2020   PLT 293 02/26/2020    Lab Results  Component Value Date   CREATININE 0.77 02/26/2020   BUN 9 02/26/2020   NA 135 02/26/2020   K 3.5 02/26/2020   CL 106 02/26/2020   CO2 17 (L) 02/26/2020    Lab Results  Component Value Date   ALT 168 (H) 02/21/2020   AST 298 (H) 02/21/2020   ALKPHOS 77 02/21/2020   BILITOT 1.5 (H) 02/21/2020     Microbiology: Recent Results (from the past 240 hour(s))  Blood Culture (routine x 2)     Status: None   Collection Time: 02/21/20  6:33 PM   Specimen: BLOOD LEFT HAND  Result Value Ref Range Status   Specimen Description BLOOD LEFT HAND  Final   Special Requests   Final    BOTTLES DRAWN AEROBIC AND ANAEROBIC Blood Culture adequate volume   Culture   Final    NO GROWTH 5 DAYS Performed at [facility], [address] [place], [place] [address]    Report Status 02/26/2020 FINAL  Final  Blood Culture (routine x 2)     Status: None   Collection Time: 02/21/20  7:37 PM   Specimen: BLOOD RIGHT HAND  Result Value Ref Range Status   Specimen Description BLOOD RIGHT HAND  Final   Special Requests   Final     BOTTLES DRAWN AEROBIC ONLY Blood Culture results may not be optimal due to an excessive volume of blood received in culture bottles   Culture   Final    NO GROWTH 5 DAYS Performed at [facility], [place] [address], [place], [place] [address]    Report Status 02/26/2020 FINAL  Final  MRSA PCR Screening     Status: None   Collection Time: 02/22/20  1:28 AM   Specimen: Nasal Mucosa; Nasopharyngeal  Result Value Ref Range Status   MRSA by PCR NEGATIVE NEGATIVE Final    Comment:        The GeneXpert MRSA Assay (FDA approved for NASAL specimens only), is one component of a comprehensive MRSA colonization surveillance program. It is not intended to diagnose MRSA infection nor to guide or monitor treatment for MRSA infections. Performed at [facility], [place] [address], [name], [place] [address]   Sputum Culture     Status: None   Collection Time: 02/22/20  4:17 AM   Specimen: Sputum  Result Value Ref Range Status   Specimen Description SPUTUM  Final   Special Requests NONE  Final   Sputum evaluation   Final    THIS SPECIMEN IS ACCEPTABLE FOR SPUTUM CULTURE Performed at [facility], [address] [place],  [address]    Report Status 02/22/2020 FINAL  Final  Culture, respiratory     Status: None   Collection Time: 02/22/20  4:17 AM   Specimen: SPU  Result Value Ref Range Status   Specimen Description SPUTUM  Final   Special Requests NONE Reflexed from [id number]  Final   Gram Stain   Final    MODERATE WBC PRESENT, PREDOMINANTLY PMN MODERATE GRAM NEGATIVE RODS RARE GRAM POSITIVE COCCI IN CLUSTERS    Culture   Final    MODERATE HAEMOPHILUS INFLUENZAE BETA LACTAMASE NEGATIVE Performed at [facility],  [address], [place], [place] [address]    Report Status 02/24/2020 FINAL  Final  Urine culture     Status: Abnormal   Collection Time: 02/22/20  4:31 AM   Specimen: Urine, Random  Result Value Ref Range Status   Specimen Description URINE,  RANDOM  Final   Special Requests NONE  Final   Culture (A)  Final    <10,000 COLONIES/mL INSIGNIFICANT GROWTH Performed at [facility], [place] [address] [place], [place] [address]    Report Status 02/23/2020 FINAL  Final  Culture, blood (routine x 2)     Status: None (Preliminary result)   Collection Time: 02/24/20 10:39 AM   Specimen: BLOOD LEFT HAND  Result Value Ref Range Status   Specimen Description BLOOD LEFT HAND  Final   Special Requests   Final    BOTTLES DRAWN AEROBIC ONLY Blood Culture results may not be optimal due to an inadequate volume of blood received in culture bottles   Culture   Final    NO GROWTH 2 DAYS Performed at [facility], [place] [address], [place], [place] [address]    Report Status PENDING  Incomplete  Culture, blood (routine x 2)     Status: None (Preliminary result)   Collection Time: 02/24/20 10:43 AM   Specimen: BLOOD RIGHT HAND  Result Value Ref Range Status   Specimen Description BLOOD RIGHT HAND  Final   Special Requests   Final    BOTTLES DRAWN AEROBIC ONLY Blood Culture results may not be optimal due to an inadequate volume of blood received in culture bottles   Culture   Final    NO GROWTH 2 DAYS Performed at [facility], [place] [address] [place], [place] [address]    Report Status PENDING  Incomplete  Culture, Urine     Status: None   Collection Time: 02/24/20  6:58 PM   Specimen: Urine, Random  Result Value Ref Range Status   Specimen Description URINE, RANDOM  Final   Special Requests NONE  Final   Culture   Final    NO GROWTH Performed at [facility], [place] [address], [place], [place] [address]    Report Status 02/25/2020 FINAL  Final    Michel Bickers, MD [facility] [phone] pager   [phone] cell 02/26/2020, 9:44 AM

## 2020-02-27 DIAGNOSIS — J849 Interstitial pulmonary disease, unspecified: Secondary | ICD-10-CM

## 2020-02-27 DIAGNOSIS — R000 Tachycardia, unspecified: Secondary | ICD-10-CM

## 2020-02-27 DIAGNOSIS — H548 Legal blindness, as defined in USA: Secondary | ICD-10-CM

## 2020-02-27 LAB — URINE CULTURE: Culture: NO GROWTH

## 2020-02-27 LAB — CBC
HCT: 27.7 % — ABNORMAL LOW (ref 36.0–46.0)
Hemoglobin: 8.8 g/dL — ABNORMAL LOW (ref 12.0–15.0)
MCH: 21.1 pg — ABNORMAL LOW (ref 26.0–34.0)
MCHC: 31.8 g/dL (ref 30.0–36.0)
MCV: 66.4 fL — ABNORMAL LOW (ref 80.0–100.0)
Platelets: 323 10*3/uL (ref 150–400)
RBC: 4.17 MIL/uL (ref 3.87–5.11)
RDW: 15.9 % — ABNORMAL HIGH (ref 11.5–15.5)
WBC: 7.7 10*3/uL (ref 4.0–10.5)
nRBC: 0 % (ref 0.0–0.2)

## 2020-02-27 LAB — BASIC METABOLIC PANEL
Anion gap: 14 (ref 5–15)
BUN: 10 mg/dL (ref 6–20)
CO2: 17 mmol/L — ABNORMAL LOW (ref 22–32)
Calcium: 8.3 mg/dL — ABNORMAL LOW (ref 8.9–10.3)
Chloride: 104 mmol/L (ref 98–111)
Creatinine, Ser: 0.62 mg/dL (ref 0.44–1.00)
GFR calc Af Amer: >60 mL/min (ref >60)
GFR calc non Af Amer: >60 mL/min (ref >60)
Glucose, Bld: 102 mg/dL — ABNORMAL HIGH (ref 70–99)
Potassium: 5.9 mmol/L — ABNORMAL HIGH (ref 3.5–5.1)
Sodium: 135 mmol/L (ref 135–145)

## 2020-02-27 MED ORDER — SODIUM CHLORIDE 0.9 % IV SOLN
2.0000 g | INTRAVENOUS | Status: DC
  Administered 2020-02-27 – 2020-02-29 (×3): 2 g via INTRAVENOUS
  Filled 2020-02-27 (×3): qty 20

## 2020-02-27 MED ORDER — IBUPROFEN 600 MG PO TABS
600.0000 mg | ORAL_TABLET | Freq: Once | ORAL | Status: AC
  Administered 2020-02-27: 600 mg via ORAL
  Filled 2020-02-27: qty 1

## 2020-02-27 MED ORDER — IBUPROFEN 600 MG PO TABS
600.0000 mg | ORAL_TABLET | Freq: Four times a day (QID) | ORAL | Status: DC | PRN
  Administered 2020-02-28: 600 mg via ORAL
  Filled 2020-02-27: qty 1

## 2020-02-27 MED ORDER — IBUPROFEN 600 MG PO TABS: 600.0000 mg | ORAL_TABLET | Freq: Once | ORAL | Status: DC

## 2020-02-27 NOTE — Progress Notes (Signed)
INFECTIOUS DISEASE PROGRESS NOTE  ID: Andrea Berry is a 54 y.o. female with  Principal Problem:   Fever Active Problems:   Legal blindness   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HCC)   Chronic central neuropathic pain   Mild cognitive impairment   SLE (systemic lupus erythematosus related syndrome) (HCC)   History of pulmonary embolism   ILD (interstitial lung disease) (HCC)   History of COVID-19   Pneumonia   Microcytic anemia  Subjective: No complaints.   Abtx:  Anti-infectives (From admission, onward)   Start     Dose/Rate Route Frequency Ordered Stop   02/27/20 1200  cefTRIAXone (ROCEPHIN) 2 g in sodium chloride 0.9 % 100 mL IVPB     Discontinue     2 g 200 mL/hr over 30 Minutes Intravenous Every 24 hours 02/27/20 1134     02/26/20 1200  cefTRIAXone (ROCEPHIN) 1 g in sodium chloride 0.9 % 100 mL IVPB  Status:  Discontinued        1 g 200 mL/hr over 30 Minutes Intravenous Every 24 hours 02/26/20 1148 02/27/20 1134   02/24/20 2300  vancomycin (VANCOREADY) IVPB 1250 mg/250 mL  Status:  Discontinued       "Followed by" Linked Group Details   1,250 mg 166.7 mL/hr over 90 Minutes Intravenous Every 12 hours 02/24/20 0916 02/26/20 0926   02/24/20 1100  vancomycin (VANCOREADY) IVPB 1500 mg/300 mL       "Followed by" Linked Group Details   1,500 mg 150 mL/hr over 120 Minutes Intravenous  Once 02/24/20 0916 02/24/20 1443   02/24/20 0900  ceFEPIme (MAXIPIME) 2 g in sodium chloride 0.9 % 100 mL IVPB  Status:  Discontinued        2 g 200 mL/hr over 30 Minutes Intravenous Every 8 hours 02/24/20 0816 02/26/20 0926   02/22/20 0400  cefTRIAXone (ROCEPHIN) 2 g in sodium chloride 0.9 % 100 mL IVPB  Status:  Discontinued        2 g 200 mL/hr over 30 Minutes Intravenous Every 24 hours 02/21/20 2245 02/24/20 0815   02/22/20 0000  azithromycin (ZITHROMAX) 500 mg in sodium chloride 0.9 % 250 mL IVPB  Status:  Discontinued        500 mg 250 mL/hr over 60 Minutes Intravenous Every 24 hours  02/21/20 2245 02/24/20 0815   02/21/20 1930  vancomycin (VANCOREADY) IVPB 1750 mg/350 mL        1,750 mg 175 mL/hr over 120 Minutes Intravenous  Once 02/21/20 1916 02/21/20 2249   02/21/20 1930  ceFEPIme (MAXIPIME) 2 g in sodium chloride 0.9 % 100 mL IVPB        2 g 200 mL/hr over 30 Minutes Intravenous  Once 02/21/20 1916 02/21/20 2006      Medications:  Scheduled: . DULoxetine  60 mg Oral Daily  . ipratropium  2 spray Each Nare Q12H  . ketotifen  1 drop Both Eyes BID  . linaclotide  145 mcg Oral QAC breakfast  . mouth rinse  15 mL Mouth Rinse BID  . pantoprazole  40 mg Oral Daily  . polyethylene glycol  17 g Oral BID  . predniSONE  5 mg Oral Q breakfast  . pregabalin  200 mg Oral TID  . rivaroxaban  10 mg Oral Daily  . sodium chloride flush  10-40 mL Intracatheter Q12H    Objective: Vital signs in last 24 hours: Temp:  [97.8 F (36.6 C)-102.9 F (39.4 C)] 97.8 F (36.6 C) (06/19 0800) Pulse Rate:  [  93-138] 97 (06/19 0800) Resp:  [26-40] 26 (06/19 0800) BP: (99-165)/(65-95) 129/71 (06/19 0800) SpO2:  [89 %-100 %] 95 % (06/19 0800)   General appearance: alert, cooperative and mild distress Resp: diminished breath sounds anterior - bilateral Cardio: regularly irregular rhythm and tachycardia GI: normal findings: bowel sounds normal and soft, non-tender  Lab Results Recent Labs    02/25/20 0449 02/26/20 0357  WBC 6.9 7.2  HGB 8.2* 9.1*  HCT 26.2* 29.3*  NA 139 135  K 3.5 3.5  CL 109 106  CO2 19* 17*  BUN 8 9  CREATININE 0.77 0.77   Liver Panel No results for input(s): PROT, ALBUMIN, AST, ALT, ALKPHOS, BILITOT, BILIDIR, IBILI in the last 72 hours. Sedimentation Rate No results for input(s): ESRSEDRATE in the last 72 hours. C-Reactive Protein No results for input(s): CRP in the last 72 hours.  Microbiology: Recent Results (from the past 240 hour(s))  Blood Culture (routine x 2)     Status: None   Collection Time: 02/21/20  6:33 PM   Specimen: BLOOD LEFT  HAND  Result Value Ref Range Status   Specimen Description BLOOD LEFT HAND  Final   Special Requests   Final    BOTTLES DRAWN AEROBIC AND ANAEROBIC Blood Culture adequate volume   Culture   Final    NO GROWTH 5 DAYS Performed at [facility], [address], [place], [place] [address]    Report Status 02/26/2020 FINAL  Final  Blood Culture (routine x 2)     Status: None   Collection Time: 02/21/20  7:37 PM   Specimen: BLOOD RIGHT HAND  Result Value Ref Range Status   Specimen Description BLOOD RIGHT HAND  Final   Special Requests   Final    BOTTLES DRAWN AEROBIC ONLY Blood Culture results may not be optimal due to an excessive volume of blood received in culture bottles   Culture   Final    NO GROWTH 5 DAYS Performed at [facility], [place] [address] [place], [place] [address]    Report Status 02/26/2020 FINAL  Final  MRSA PCR Screening     Status: None   Collection Time: 02/22/20  1:28 AM   Specimen: Nasal Mucosa; Nasopharyngeal  Result Value Ref Range Status   MRSA by PCR NEGATIVE NEGATIVE Final    Comment:        The GeneXpert MRSA Assay (FDA approved for NASAL specimens only), is one component of a comprehensive MRSA colonization surveillance program. It is not intended to diagnose MRSA infection nor to guide or monitor treatment for MRSA infections. Performed at [facility], [place] [address], [place], [place] [address]   Sputum Culture     Status: None   Collection Time: 02/22/20  4:17 AM   Specimen: Sputum  Result Value Ref Range Status   Specimen Description SPUTUM  Final   Special Requests NONE  Final   Sputum evaluation   Final    THIS SPECIMEN IS ACCEPTABLE FOR SPUTUM CULTURE Performed at [facility], [address] [place], [place] [address]    Report Status 02/22/2020 FINAL  Final  Culture, respiratory     Status: None   Collection Time: 02/22/20  4:17 AM   Specimen: SPU  Result Value Ref Range Status   Specimen Description  SPUTUM  Final   Special Requests NONE Reflexed from [id number]  Final   Gram Stain   Final    MODERATE WBC PRESENT, PREDOMINANTLY  PMN MODERATE GRAM NEGATIVE RODS RARE GRAM POSITIVE COCCI IN CLUSTERS    Culture   Final    MODERATE HAEMOPHILUS INFLUENZAE BETA LACTAMASE NEGATIVE Performed at [facility], [place] [address] [place], [place] [address]    Report Status 02/24/2020 FINAL  Final  Urine culture     Status: Abnormal   Collection Time: 02/22/20  4:31 AM   Specimen: Urine, Random  Result Value Ref Range Status   Specimen Description URINE, RANDOM  Final   Special Requests NONE  Final   Culture (A)  Final    <10,000 COLONIES/mL INSIGNIFICANT GROWTH Performed at [facility], [place] [address], [place], [place] [address]    Report Status 02/23/2020 FINAL  Final  Culture, blood (routine x 2)     Status: None (Preliminary result)   Collection Time: 02/24/20 10:39 AM   Specimen: BLOOD LEFT HAND  Result Value Ref Range Status   Specimen Description BLOOD LEFT HAND  Final   Special Requests   Final    BOTTLES DRAWN AEROBIC ONLY Blood Culture results may not be optimal due to an inadequate volume of blood received in culture bottles   Culture   Final    NO GROWTH 3 DAYS Performed at [facility], [place] [address] [place], [place] [address]    Report Status PENDING  Incomplete  Culture, blood (routine x 2)     Status: None (Preliminary result)   Collection Time: 02/24/20 10:43 AM   Specimen: BLOOD RIGHT HAND  Result Value Ref Range Status   Specimen Description BLOOD RIGHT HAND  Final   Special Requests   Final    BOTTLES DRAWN AEROBIC ONLY Blood Culture results may not be optimal due to an inadequate volume of blood received in culture bottles   Culture   Final    NO GROWTH 3 DAYS Performed at [facility], [place] [address], [place], [place] [address]    Report Status PENDING  Incomplete  Culture, Urine     Status: None   Collection Time: 02/24/20  6:58 PM    Specimen: Urine, Random  Result Value Ref Range Status   Specimen Description URINE, RANDOM  Final   Special Requests NONE  Final   Culture   Final    NO GROWTH Performed at [facility], [place] [address], [place], [place] [address]    Report Status 02/25/2020 FINAL  Final  Culture, blood (Routine X 2) w Reflex to ID Panel     Status: None (Preliminary result)   Collection Time: 02/26/20  6:38 AM   Specimen: BLOOD RIGHT HAND  Result Value Ref Range Status   Specimen Description BLOOD RIGHT HAND  Final   Special Requests   Final    BOTTLES DRAWN AEROBIC ONLY Blood Culture results may not be optimal due to an inadequate volume of blood received in culture bottles   Culture   Final    NO GROWTH 1 DAY Performed at [facility], [place] [address] [place], [place] [address]    Report Status PENDING  Incomplete  Culture, blood (Routine X 2) w Reflex to ID Panel     Status: None (Preliminary result)   Collection Time: 02/26/20  7:17 AM   Specimen: BLOOD  Result Value Ref Range Status   Specimen Description BLOOD RIGHT ANTECUBITAL  Final   Special Requests   Final    BOTTLES DRAWN AEROBIC ONLY Blood Culture results may not be  optimal due to an inadequate volume of blood received in culture bottles   Culture   Final    NO GROWTH 1 DAY Performed at [facility], [place] [address], [place], [place] [address]    Report Status PENDING  Incomplete  Urine Culture     Status: None   Collection Time: 02/26/20 10:49 AM   Specimen: Urine, Random  Result Value Ref Range Status   Specimen Description URINE, RANDOM  Final   Special Requests NONE  Final   Culture   Final    NO GROWTH Performed at [facility], [place] [address] [place], [place] [address]    Report Status 02/27/2020 FINAL  Final    Studies/Results: CT CHEST W CONTRAST  Result Date: 02/25/2020 CLINICAL DATA:  Pneumonia EXAM: CT CHEST WITH CONTRAST TECHNIQUE: Multidetector CT imaging of the chest was performed  during intravenous contrast administration. CONTRAST:  143mL OMNIPAQUE IOHEXOL 300 MG/ML  SOLN COMPARISON:  02/24/2020 FINDINGS: Cardiovascular: Heart is unremarkable without pericardial effusion. Normal caliber of the thoracic aorta. No significant atherosclerosis. Mediastinum/Nodes: No enlarged mediastinal, hilar, or axillary lymph nodes. Thyroid gland, trachea, and esophagus demonstrate no significant findings. Lungs/Pleura: There is multifocal bilateral airspace disease, most pronounced within the left upper lobe. Trace bilateral pleural effusions are noted. Minimal dependent lower lobe atelectasis. No pneumothorax. Central airways are patent. Upper Abdomen: No acute abnormality. Musculoskeletal: No acute or destructive bony lesions. Reconstructed images demonstrate no additional findings. IMPRESSION: 1. Multifocal bilateral pneumonia, not significantly changed since previous chest x-ray. 2. Trace bilateral pleural effusions and minimal dependent lower lobe atelectasis. Electronically Signed   By: Randa Ngo M.D.   On: 02/25/2020 23:57     Assessment/Plan: COVID Post-COVID/long COVID syndrome Influenza pna superinfection  Total days of antibiotics: 7  Ceftriaxone  She continues to have fever and has tachypnea and tachycardia.  Her contrast CT did not show PE and she is on anticoagulants.  Continue her anbx and supportive care.          Bobby Rumpf MD, FACP Infectious Diseases (pager) [phone] [email or web address] 02/27/2020, 12:33 PM  LOS: 5 days

## 2020-02-27 NOTE — Progress Notes (Signed)
Patient's [place] was Red for temp 100.3, pulse 116, resp 28. Dicussed patient's condition with charge nurse Lexine Baton and notified on call family medicine MD [place] Silos. Will continue monitor.

## 2020-02-27 NOTE — Progress Notes (Addendum)
FMTS Attending Daily Note: Dorris Singh, MD  Team Pager [phone] Pager [phone]  I have seen and examined this patient, reviewed their chart. I have discussed this patient with the resident. I agree with the resident's findings, assessment and care plan.   Family Medicine Teaching Service Daily Progress Note Intern Pager: [phone]  Patient name: Andrea Berry Medical record number: [id number] Date of birth: [birth year] Age: 54 y.o. Gender: female  Primary Care Provider: McDiarmid, Blane Ohara, MD Consultants: None Code Status: Full  Pt Overview and Major Events to Date:  6/13 - admitted 6/18- ID consulted, transitioned back to Ceftriaxone for H. Influenzae pneumonia   Assessment and Plan: Andrea Berry is a 54 y.o. female presenting with acute on chronic respiratory failure likely due to superimposed H. Influenzae pneumoniae in setting of prolonged COVID19. PMH is significant for legal blindness, SLE, chronic interstitial lung disease,hx of PE on Xarelto, chronic central neuropathic pain, mild cognitive impairment, mood disorder, neuromyelitis optica, steroid-induced osteopenia, migraine without aura.  H. Influenza pneumoniae and prolonged COVID19 resulting in acute on chronic hypoxemic respiratory failure  Diagnosed with COVID-19 on 5/27.  Status post remdesivir and Decadron. Patient sputum cultures grew moderate Haemophilus influenza, urine cultures with less than 10,000 colonies and insignificant growth. Sputum culture shows rare gram-positive cocci in clusters.  Blood and urine cultures negative.   Of note patient was MRSA PCR negative on admission. Patient did not have coverage for Pseudomonas other than her initial antibiotics on presentation, however I would have expected her to worsen before now if this was present. Patient's urine has been negative patient without any new complaints suggestive of other etiologies for new infection.  Chest CT showed multifocal bilateral pneumonia with no  significant change since previous x-ray, trace bilateral pleural effusions with minimal dependent lower lobe atelectasis.  WBCs yesterday were 7.2 and CBC has not yet been collected today.  Patient fevered overnight to 102.9, she was given Tylenol and the fever responded down to 102.6.  We were called because of the poor response and she was given 1 dose of 600 mg ibuprofen which resolved her fever down to 98.6 this morning.  She was tachycardic during her fever to the 110s.  Infectious disease was consulted who feels this is most likely due to H influenza superimposed upon COVID-19 infection with persistent inflammatory response.  Recommend ceftriaxone alone and continue isolation. -Continuous pulse ox/cardiac monitoring, goal O2 >92% -Continue ceftriaxone alone for a minimum of 7 days, will plan for transition to PO 6/19 or 6/20  - ID consulted 6/18, continue Ceftriaxone, possibly prolonged COVID19 -Continue isolation  Persistent fevers likely due to above, other causes considered include SLE flare (negative markers with normal C3/4), serotonin syndrome. No new medications, has been on pregabalin and SNRI for >2 years. Will monitor and treat only symptomatic fevers.   Anemia Patient CBC had a hemoglobin dropped to 8.2 today from 10.5 on 6/13.  Hemoglobin 6/18 was 9.2.  CBC this morning is pending.  Patient not endorsing any sources of bleeding.  MCV at 69.6.  Iron panel shows iron reduced at 6, saturation ratio reduced at 3, ferritin increased at 942 suggestive of anemia of chronic disease.  Patient's ferritin can also be increased as an acute phase reactant in the setting of inflammation however with the patient's history of lupus anemia of chronic disease seems most consistent. -C3 and C4 not decreased which suggest against a lupus flare.  Double-stranded DNA negative   AKI -  resolved Creatinine  on arrival 1.27>0.77 currently.  Creatinine at discharge on 6/1 was 0.87.  Most likely perfusion  related. -Monitor vital signs -Avoid nephrotoxic agents  Neuromyelitis optica Patient is legally blind Currently following with ophthalmology.  No acute changes in vision -Continue home ophthalmic solution  SLE Follows with [facility] rheumatology.  She was on hydroxychloroquine but is not taking it per her ophthalmologist request.  Home medication includes prednisone 5 mg daily and she receives rituximab infusions every 6 months.  While hospitalized for Covid 2 weeks ago she received a course of Decadron. -Restarted patient's home prednisone after discontinued Decadron  History of PE PE occurred in 2017.  Home medications include Xarelto 10 mg daily.  Patient negative for antiphospholipid syndrome -Continue home Xarelto  History of transverse myelitis, Devic's disease Patient pain developed in 2017.  Chronic left upper and lower extremity weakness.  Home medications include tramadol, Neurontin, Lyrica, Trileptal. -Continue home schedule Trileptal and Lyrica -On tramadol as needed  Chronic interstitial lung disease Patient sees pulmonology as well as asthma and allergy.  PFT on 11/24/2018 unremarkable.  Per chart review from previous admission the patient had PFT performed December 2020 which they were unable to find records for.  Home medication includes Flonase nasal spray.  Insomnia Home medication includes Ativan 0.5 mg nightly. -Melatonin as needed  Prednisone-induced osteoporosis Patient is on alendronate diet at home, is supposed to be on 2-year holiday given longstanding use  Mood disorder Home medication includes Cymbalta 60 mg daily -Continue home Cymbalta  Hypertension Most recent blood pressure 138/75.  Patient was previously recommended to take Norvasc by cardiology but it does not appear that she is on any antihypertensive at this time. -Continue to monitor  Aortic regurgitation Patient had evidence on TEE with no evidence of structural changes.  No  treatment necessary per cardiology.  History of CVA Patient had CVA in 2008.  Home medications include Xarelto -Continue home Xarelto, no antiplatelets  FEN/GI: regular diet PPx: Xeralto   Disposition: Pending clinical improvement possibly 6/20   Subjective:  Patient reports she feels better this morning compared to last night.  Her fever has resolved.  Feels that her breathing is improved a little bit this morning.  No questions or concerns at this time.  Objective: Temp:  [97.9 F (36.6 C)-102.9 F (39.4 C)] 98.6 F (37 C) (06/19 0406) Pulse Rate:  [105-138] 105 (06/19 0400) Resp:  [26-40] 28 (06/19 0400) BP: (99-179)/(65-116) 124/74 (06/19 0400) SpO2:  [89 %-100 %] 95 % (06/19 0400) Physical Exam: General: Sleeping when I enter the room, easily arousable, no acute distress Heart: Intermittently tachycardic rate, regular rhythm, no murmurs noted Lungs: Crackles at lung bases bilaterally, improved from admission exam.  On 1 L nasal cannula satting 93 to 96% Abdomen: Bowel sounds present, no abdominal pain on palpation Skin: Warm and dry  Laboratory: Recent Labs  Lab 02/24/20 0249 02/25/20 0449 02/26/20 0357  WBC 6.3 6.9 7.2  HGB 9.0* 8.2* 9.1*  HCT 28.6* 26.2* 29.3*  PLT 199 255 293   Recent Labs  Lab 02/21/20 1831 02/21/20 1945 02/24/20 0249 02/25/20 0449 02/26/20 0357  NA 133*   < > 143 139 135  K 4.1   < > 4.0 3.5 3.5  CL 105   < > 116* 109 106  CO2 14*   < > 20* 19* 17*  BUN 16   < > 12 8 9   CREATININE 1.27*   < > 0.78 0.77 0.77  CALCIUM 8.4*   < >  8.5* 8.1* 8.1*  PROT 6.5  --   --   --   --   BILITOT 1.5*  --   --   --   --   ALKPHOS 77  --   --   --   --   ALT 168*  --   --   --   --   AST 298*  --   --   --   --   GLUCOSE 153*   < > 92 94 87   < > = values in this interval not displayed.    Imaging/Diagnostic Tests: CT Head Wo Contrast  Result Date: 02/04/2020 CLINICAL DATA:  Acute pain due to trauma. EXAM: CT HEAD WITHOUT CONTRAST  TECHNIQUE: Contiguous axial images were obtained from the base of the skull through the vertex without intravenous contrast. COMPARISON:  MRI dated November 25, 2015. CT head dated September 21, 2015. FINDINGS: Brain: No evidence of acute infarction, hemorrhage, hydrocephalus, extra-axial collection or mass lesion/mass effect. Vascular: No hyperdense vessel or unexpected calcification. Skull: Normal. Negative for fracture or focal lesion. Sinuses/Orbits: There is pansinus mucosal thickening. The mastoid air cells are essentially clear. Other: None. IMPRESSION: 1. No acute intracranial abnormality. 2. Pansinus mucosal thickening. Electronically Signed   By: Constance Holster M.D.   On: 02/04/2020 17:52   CT CHEST W CONTRAST  Result Date: 02/25/2020 CLINICAL DATA:  Pneumonia EXAM: CT CHEST WITH CONTRAST TECHNIQUE: Multidetector CT imaging of the chest was performed during intravenous contrast administration. CONTRAST:  155mL OMNIPAQUE IOHEXOL 300 MG/ML  SOLN COMPARISON:  02/24/2020 FINDINGS: Cardiovascular: Heart is unremarkable without pericardial effusion. Normal caliber of the thoracic aorta. No significant atherosclerosis. Mediastinum/Nodes: No enlarged mediastinal, hilar, or axillary lymph nodes. Thyroid gland, trachea, and esophagus demonstrate no significant findings. Lungs/Pleura: There is multifocal bilateral airspace disease, most pronounced within the left upper lobe. Trace bilateral pleural effusions are noted. Minimal dependent lower lobe atelectasis. No pneumothorax. Central airways are patent. Upper Abdomen: No acute abnormality. Musculoskeletal: No acute or destructive bony lesions. Reconstructed images demonstrate no additional findings. IMPRESSION: 1. Multifocal bilateral pneumonia, not significantly changed since previous chest x-ray. 2. Trace bilateral pleural effusions and minimal dependent lower lobe atelectasis. Electronically Signed   By: Randa Ngo M.D.   On: 02/25/2020 23:57   DG CHEST  PORT 1 VIEW  Result Date: 02/24/2020 CLINICAL DATA:  Fever EXAM: PORTABLE CHEST 1 VIEW COMPARISON:  February 21, 2020 FINDINGS: There is airspace opacity throughout the right mid and lower lung regions as well as in the left mid lung and left base regions. There is atelectatic change in the medial right base. There is a probable small left pleural effusion. Heart is slightly enlarged with pulmonary vascularity normal. No adenopathy. No bone lesions. IMPRESSION: Multifocal pneumonia, slightly increased in the left mid lung and stable elsewhere. Probable small left pleural effusion. Medial right base atelectasis. Stable cardiac prominence.  No adenopathy evident. Electronically Signed   By: Lowella Grip III M.D.   On: 02/24/2020 10:20   DG Chest Port 1 View  Result Date: 02/21/2020 CLINICAL DATA:  Shortness of breath.  COVID positive. EXAM: PORTABLE CHEST 1 VIEW COMPARISON:  Feb 04, 2020 FINDINGS: There are hazy bilateral airspace opacities, greatest at the lung bases bilaterally. There is no pneumothorax. There are small bilateral pleural effusions, right greater than left. The cardiac silhouette appears enlarged but stable from prior study. There is no acute osseous abnormality. IMPRESSION: 1. Multifocal pneumonia (viral or bacterial). 2. Small bilateral pleural  effusions, right greater than left. Electronically Signed   By: Constance Holster M.D.   On: 02/21/2020 19:09   DG Chest Port 1 View  Result Date: 02/04/2020 CLINICAL DATA:  COVID-19.  Cough.  Shortness of breath. EXAM: PORTABLE CHEST 1 VIEW COMPARISON:  11/06/2018 FINDINGS: The heart size and pulmonary vascularity are normal. Slight haziness at the left lung base laterally. Chronic scarring at the right lung base. No effusions. No bone abnormality of significance. IMPRESSION: Slight haziness at the left lung base laterally which could represent a small area of infiltrate. Scarring at the right lung base. Electronically Signed   By: Lorriane Shire M.D.   On: 02/04/2020 14:48    Gifford Shave, MD 02/27/2020, 5:27 AM PGY-1, [place] Intern pager: [phone], text pages welcome

## 2020-02-27 NOTE — Plan of Care (Signed)
  Problem: Respiratory: Goal: Complications related to the disease process, condition or treatment will be avoided or minimized Outcome: Progressing   

## 2020-02-27 NOTE — Progress Notes (Addendum)
1618- Patient has a Red Mews score due to her HR and respirations.  Pt's vitals are as follows:  T 99.1, P 115, R 32, O2 95% on 1 L [place], and BP 145/76.  Pt assessed.  Pt alert and oriented x4.  Pt denies chest pain, shortness of breath, dizziness, lightheadedness, and n/v.  Primitivo Gauze RN and Family medicine doctor notified.  MD stated to recheck vitals in 1 hour.  1720- Pt's vitals rechecked.  Pt still a red mews score.  Pt's HR and respirations still elevated.  Pt vitals are as follows:  T 100.9, P 125, R 26, O2 94 on 1 L , and BP 168/83. Pt given 650 mg of Tylenol for fever. Family medicine doctor notified.  No new orders given.  Will continue to monitor pt and reassess vitals per MEWS protocol.  [place] recheck was 99.5

## 2020-02-28 DIAGNOSIS — U071 COVID-19: Secondary | ICD-10-CM

## 2020-02-28 DIAGNOSIS — G360 Neuromyelitis optica [Devic]: Secondary | ICD-10-CM

## 2020-02-28 LAB — CBC
HCT: 28.1 % — ABNORMAL LOW (ref 36.0–46.0)
Hemoglobin: 8.9 g/dL — ABNORMAL LOW (ref 12.0–15.0)
MCH: 21.3 pg — ABNORMAL LOW (ref 26.0–34.0)
MCHC: 31.7 g/dL (ref 30.0–36.0)
MCV: 67.4 fL — ABNORMAL LOW (ref 80.0–100.0)
Platelets: 282 10*3/uL (ref 150–400)
RBC: 4.17 MIL/uL (ref 3.87–5.11)
RDW: 15.8 % — ABNORMAL HIGH (ref 11.5–15.5)
WBC: 8.2 10*3/uL (ref 4.0–10.5)
nRBC: 0 % (ref 0.0–0.2)

## 2020-02-28 LAB — HEPATIC FUNCTION PANEL
ALT: 48 U/L — ABNORMAL HIGH (ref 0–44)
AST: 86 U/L — ABNORMAL HIGH (ref 15–41)
Albumin: 2.1 g/dL — ABNORMAL LOW (ref 3.5–5.0)
Alkaline Phosphatase: 78 U/L (ref 38–126)
Bilirubin, Direct: 0.2 mg/dL (ref 0.0–0.2)
Indirect Bilirubin: 0.4 mg/dL (ref 0.3–0.9)
Total Bilirubin: 0.6 mg/dL (ref 0.3–1.2)
Total Protein: 5.7 g/dL — ABNORMAL LOW (ref 6.5–8.1)

## 2020-02-28 LAB — BASIC METABOLIC PANEL
Anion gap: 11 (ref 5–15)
BUN: 9 mg/dL (ref 6–20)
CO2: 20 mmol/L — ABNORMAL LOW (ref 22–32)
Calcium: 8.6 mg/dL — ABNORMAL LOW (ref 8.9–10.3)
Chloride: 105 mmol/L (ref 98–111)
Creatinine, Ser: 0.8 mg/dL (ref 0.44–1.00)
GFR calc Af Amer: >60 mL/min (ref >60)
GFR calc non Af Amer: >60 mL/min (ref >60)
Glucose, Bld: 99 mg/dL (ref 70–99)
Potassium: 3.7 mmol/L (ref 3.5–5.1)
Sodium: 136 mmol/L (ref 135–145)

## 2020-02-28 NOTE — Plan of Care (Signed)
  Problem: Respiratory: Goal: Complications related to the disease process, condition or treatment will be avoided or minimized Outcome: Progressing   

## 2020-02-28 NOTE — Progress Notes (Signed)
Patient's Vs MEWs turn to red for heart rate and respiration. Discussed patient's conditions with charge nurse Lexine Baton . Patient is alert and oriented. No acute change.will continue monitor.

## 2020-02-28 NOTE — Progress Notes (Signed)
Family Medicine Teaching Service Daily Progress Note Intern Pager: [phone]  Patient name: Andrea Berry Medical record number: [id number] Date of birth: [birth year] Age: 54 y.o. Gender: female  Primary Care Provider: McDiarmid, Blane Ohara, MD Consultants: None Code Status: Full  Pt Overview and Major Events to Date:  6/13 - admitted 6/18- ID consulted, transitioned back to Ceftriaxone for H. Influenzae pneumonia   Assessment and Plan: Andrea Berry is a 54 y.o. female presenting with acute on chronic respiratory failure likely due to superimposed H. Influenzae pneumoniae in setting of prolonged COVID19. PMH is significant for legal blindness, SLE, chronic interstitial lung disease,hx of PE on Xarelto, chronic central neuropathic pain, mild cognitive impairment, mood disorder, neuromyelitis optica, steroid-induced osteopenia, migraine without aura.  H. Influenza pneumoniae and prolonged COVID19 resulting in acute on chronic hypoxemic respiratory failure  Diagnosed with COVID-19 on 5/27.   Chest CT showed multifocal bilateral pneumonia with no significant change since previous x-ray, trace bilateral pleural effusions with minimal dependent lower lobe atelectasis. Infectious disease was consulted who feels this is most likely due to H influenza superimposed upon COVID-19 infection with persistent inflammatory response.  Recommend ceftriaxone alone and continue isolation.  Patient continued with some fevers overnight to 101.7. -Continuous pulse ox/cardiac monitoring, goal O2 >92% -Continue ceftriaxone alone for a minimum of 7 days, will plan for transition to PO as patient improves - ID consulted 6/18, continue Ceftriaxone, possibly prolonged COVID19 -Continue isolation  Persistent fevers likely due to above, other causes considered include SLE flare (negative markers with normal C3/4), serotonin syndrome. No new medications, has been on pregabalin and SNRI for >2 years. Will monitor and treat only  symptomatic fevers. -Monitor for fevers, treat fevers if symptomatic  Constipation Patient states even with MiraLAX she has been unable to have a bowel movement.  She states she does not think she has had one since this hospitalization.  Discussed with patient about attempting soapsuds enema which patient agreed to. -Continue MiraLAX scheduled -Soapsuds enema  Anemia Patient CBC had a hemoglobin 8.9 today from 10.5 on 6/13.  Hemoglobin 6/18 was 9.2.  Patient not endorsing any sources of bleeding.  Iron panel shows iron reduced at 6, saturation ratio reduced at 3, ferritin increased at 942 suggestive of anemia of chronic disease.  Patient's ferritin can also be increased as an acute phase reactant in the setting of inflammation however with the patient's history of lupus anemia of chronic disease seems most consistent. -C3 and C4 not decreased which suggest against a lupus flare.  -Double-stranded DNA negative   AKI -  resolved Creatinine on arrival 1.27>0.80 currently.  Creatinine at discharge on 6/1 was 0.87.  Most likely perfusion related. -Monitor vital signs -Avoid nephrotoxic agents  Neuromyelitis optica Patient is legally blind Currently following with ophthalmology.  No acute changes in vision -Continue home ophthalmic solution  SLE Follows with [facility]ology.  She was on hydroxychloroquine but is not taking it per her ophthalmologist request.  Home medication includes prednisone 5 mg daily and she receives rituximab infusions every 6 months.  While hospitalized for Covid 2 weeks ago she received a course of Decadron. -Restarted patient's home prednisone after discontinued Decadron  History of PE PE occurred in 2017.  Home medications include Xarelto 10 mg daily.  Patient negative for antiphospholipid syndrome -Continue home Xarelto  History of transverse myelitis, Devic's disease Patient pain developed in 2017.  Chronic left upper and lower extremity weakness.   Home medications include tramadol, Neurontin, Lyrica, Trileptal. -  Continue home schedule Trileptal and Lyrica -On tramadol as needed  Chronic interstitial lung disease Patient sees pulmonology as well as asthma and allergy.  PFT on 11/24/2018 unremarkable.  Per chart review from previous admission the patient had PFT performed December 2020 which they were unable to find records for.  Home medication includes Flonase nasal spray.  Insomnia Home medication includes Ativan 0.5 mg nightly. -Melatonin as needed  Prednisone-induced osteoporosis Patient is on alendronate diet at home, is supposed to be on 2-year holiday given longstanding use  Mood disorder Home medication includes Cymbalta 60 mg daily -Continue home Cymbalta  Hypertension Most recent blood pressure 138/75.  Patient was previously recommended to take Norvasc by cardiology but it does not appear that she is on any antihypertensive at this time. -Continue to monitor  Aortic regurgitation Patient had evidence on TEE with no evidence of structural changes.  No treatment necessary per cardiology.  History of CVA Patient had CVA in 2008.  Home medications include Xarelto -Continue home Xarelto, no antiplatelets  FEN/GI: regular diet PPx: Xeralto   Disposition: Pending clinical improvement    Subjective:  Patient states she feels her breathing is better at this time.  Nasal cannula not in place and patient satting 96% currently.  Patient does state her only complaint she has not had a bowel movement she believes through the entire duration of this hospitalization.  When asked about prospects of using an enema to try to help her move her bowels she states that she would like to do whatever it takes.  Spoke to radiology regarding patient's previous CT chest with contrast.  Radiology stated that some contrast does get into the vasculature to the extent that they can confidently state there does not seem to be a saddle  embolus or a large PE present.  Contrast does not reach the distal vasculature so cannot rule out a PE overall, which would be better evaluated with a CTA.   Objective: Temp:  [97.8 F (36.6 C)-103.2 F (39.6 C)] 99 F (37.2 C) (06/20 0422) Pulse Rate:  [97-131] 98 (06/20 0557) Resp:  [25-39] 25 (06/20 0557) BP: (129-169)/(68-98) 129/68 (06/20 0422) SpO2:  [93 %-100 %] 100 % (06/20 0557) Physical Exam: General: Alert and oriented in no apparent distress Heart: Regular rate and rhythm with no murmurs appreciated Lungs: Some crackles bilaterally but seem to be improving from my previous exam on Friday, nasal cannula not in place with patient satting 96%. Abdomen: Bowel sounds present, moderately distended, no abdominal pain on palpation Skin: Warm and dry  Laboratory: Recent Labs  Lab 02/26/20 0357 02/27/20 1916 02/28/20 0404  WBC 7.2 7.7 8.2  HGB 9.1* 8.8* 8.9*  HCT 29.3* 27.7* 28.1*  PLT 293 323 282   Recent Labs  Lab 02/21/20 1831 02/21/20 1945 02/26/20 0357 02/27/20 1211 02/28/20 0404  NA 133*   < > 135 135 136  K 4.1   < > 3.5 5.9* 3.7  CL 105   < > 106 104 105  CO2 14*   < > 17* 17* 20*  BUN 16   < > 9 10 9   CREATININE 1.27*   < > 0.77 0.62 0.80  CALCIUM 8.4*   < > 8.1* 8.3* 8.6*  PROT 6.5  --   --   --   --   BILITOT 1.5*  --   --   --   --   ALKPHOS 77  --   --   --   --  ALT 168*  --   --   --   --   AST 298*  --   --   --   --   GLUCOSE 153*   < > 87 102* 99   < > = values in this interval not displayed.    Imaging/Diagnostic Tests: CT Head Wo Contrast  Result Date: 02/04/2020 CLINICAL DATA:  Acute pain due to trauma. EXAM: CT HEAD WITHOUT CONTRAST TECHNIQUE: Contiguous axial images were obtained from the base of the skull through the vertex without intravenous contrast. COMPARISON:  MRI dated November 25, 2015. CT head dated September 21, 2015. FINDINGS: Brain: No evidence of acute infarction, hemorrhage, hydrocephalus, extra-axial collection or mass  lesion/mass effect. Vascular: No hyperdense vessel or unexpected calcification. Skull: Normal. Negative for fracture or focal lesion. Sinuses/Orbits: There is pansinus mucosal thickening. The mastoid air cells are essentially clear. Other: None. IMPRESSION: 1. No acute intracranial abnormality. 2. Pansinus mucosal thickening. Electronically Signed   By: Constance Holster M.D.   On: 02/04/2020 17:52   CT CHEST W CONTRAST  Result Date: 02/25/2020 CLINICAL DATA:  Pneumonia EXAM: CT CHEST WITH CONTRAST TECHNIQUE: Multidetector CT imaging of the chest was performed during intravenous contrast administration. CONTRAST:  154mL OMNIPAQUE IOHEXOL 300 MG/ML  SOLN COMPARISON:  02/24/2020 FINDINGS: Cardiovascular: Heart is unremarkable without pericardial effusion. Normal caliber of the thoracic aorta. No significant atherosclerosis. Mediastinum/Nodes: No enlarged mediastinal, hilar, or axillary lymph nodes. Thyroid gland, trachea, and esophagus demonstrate no significant findings. Lungs/Pleura: There is multifocal bilateral airspace disease, most pronounced within the left upper lobe. Trace bilateral pleural effusions are noted. Minimal dependent lower lobe atelectasis. No pneumothorax. Central airways are patent. Upper Abdomen: No acute abnormality. Musculoskeletal: No acute or destructive bony lesions. Reconstructed images demonstrate no additional findings. IMPRESSION: 1. Multifocal bilateral pneumonia, not significantly changed since previous chest x-ray. 2. Trace bilateral pleural effusions and minimal dependent lower lobe atelectasis. Electronically Signed   By: Randa Ngo M.D.   On: 02/25/2020 23:57   DG CHEST PORT 1 VIEW  Result Date: 02/24/2020 CLINICAL DATA:  Fever EXAM: PORTABLE CHEST 1 VIEW COMPARISON:  February 21, 2020 FINDINGS: There is airspace opacity throughout the right mid and lower lung regions as well as in the left mid lung and left base regions. There is atelectatic change in the medial right  base. There is a probable small left pleural effusion. Heart is slightly enlarged with pulmonary vascularity normal. No adenopathy. No bone lesions. IMPRESSION: Multifocal pneumonia, slightly increased in the left mid lung and stable elsewhere. Probable small left pleural effusion. Medial right base atelectasis. Stable cardiac prominence.  No adenopathy evident. Electronically Signed   By: Lowella Grip III M.D.   On: 02/24/2020 10:20   DG Chest Port 1 View  Result Date: 02/21/2020 CLINICAL DATA:  Shortness of breath.  COVID positive. EXAM: PORTABLE CHEST 1 VIEW COMPARISON:  Feb 04, 2020 FINDINGS: There are hazy bilateral airspace opacities, greatest at the lung bases bilaterally. There is no pneumothorax. There are small bilateral pleural effusions, right greater than left. The cardiac silhouette appears enlarged but stable from prior study. There is no acute osseous abnormality. IMPRESSION: 1. Multifocal pneumonia (viral or bacterial). 2. Small bilateral pleural effusions, right greater than left. Electronically Signed   By: Constance Holster M.D.   On: 02/21/2020 19:09   DG Chest Port 1 View  Result Date: 02/04/2020 CLINICAL DATA:  COVID-19.  Cough.  Shortness of breath. EXAM: PORTABLE CHEST 1 VIEW COMPARISON:  11/06/2018 FINDINGS:  The heart size and pulmonary vascularity are normal. Slight haziness at the left lung base laterally. Chronic scarring at the right lung base. No effusions. No bone abnormality of significance. IMPRESSION: Slight haziness at the left lung base laterally which could represent a small area of infiltrate. Scarring at the right lung base. Electronically Signed   By: Lorriane Shire M.D.   On: 02/04/2020 14:48    Lurline Del, DO 02/28/2020, 7:23 AM PGY-1, [place] Intern pager: [phone], text pages welcome

## 2020-02-28 NOTE — Progress Notes (Signed)
INFECTIOUS DISEASE PROGRESS NOTE  ID: Andrea Berry is a 54 y.o. female with  Principal Problem:   Fever Active Problems:   Legal blindness   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HCC)   Chronic central neuropathic pain   Mild cognitive impairment   SLE (systemic lupus erythematosus related syndrome) (HCC)   History of pulmonary embolism   ILD (interstitial lung disease) ([place])   COVID-19   History of COVID-19   Pneumonia   Microcytic anemia  Subjective: Still some episodes of hypoxia No fever today (so far). No complaints. Feels fair.   Abtx:  Anti-infectives (From admission, onward)   Start     Dose/Rate Route Frequency Ordered Stop   02/27/20 1200  cefTRIAXone (ROCEPHIN) 2 g in sodium chloride 0.9 % 100 mL IVPB     Discontinue     2 g 200 mL/hr over 30 Minutes Intravenous Every 24 hours 02/27/20 1134     02/26/20 1200  cefTRIAXone (ROCEPHIN) 1 g in sodium chloride 0.9 % 100 mL IVPB  Status:  Discontinued        1 g 200 mL/hr over 30 Minutes Intravenous Every 24 hours 02/26/20 1148 02/27/20 1134   02/24/20 2300  vancomycin (VANCOREADY) IVPB 1250 mg/250 mL  Status:  Discontinued       "Followed by" Linked Group Details   1,250 mg 166.7 mL/hr over 90 Minutes Intravenous Every 12 hours 02/24/20 0916 02/26/20 0926   02/24/20 1100  vancomycin (VANCOREADY) IVPB 1500 mg/300 mL       "Followed by" Linked Group Details   1,500 mg 150 mL/hr over 120 Minutes Intravenous  Once 02/24/20 0916 02/24/20 1443   02/24/20 0900  ceFEPIme (MAXIPIME) 2 g in sodium chloride 0.9 % 100 mL IVPB  Status:  Discontinued        2 g 200 mL/hr over 30 Minutes Intravenous Every 8 hours 02/24/20 0816 02/26/20 0926   02/22/20 0400  cefTRIAXone (ROCEPHIN) 2 g in sodium chloride 0.9 % 100 mL IVPB  Status:  Discontinued        2 g 200 mL/hr over 30 Minutes Intravenous Every 24 hours 02/21/20 2245 02/24/20 0815   02/22/20 0000  azithromycin (ZITHROMAX) 500 mg in sodium chloride 0.9 % 250 mL IVPB  Status:   Discontinued        500 mg 250 mL/hr over 60 Minutes Intravenous Every 24 hours 02/21/20 2245 02/24/20 0815   02/21/20 1930  vancomycin (VANCOREADY) IVPB 1750 mg/350 mL        1,750 mg 175 mL/hr over 120 Minutes Intravenous  Once 02/21/20 1916 02/21/20 2249   02/21/20 1930  ceFEPIme (MAXIPIME) 2 g in sodium chloride 0.9 % 100 mL IVPB        2 g 200 mL/hr over 30 Minutes Intravenous  Once 02/21/20 1916 02/21/20 2006      Medications:  Scheduled: . DULoxetine  60 mg Oral Daily  . ipratropium  2 spray Each Nare Q12H  . ketotifen  1 drop Both Eyes BID  . linaclotide  145 mcg Oral QAC breakfast  . mouth rinse  15 mL Mouth Rinse BID  . pantoprazole  40 mg Oral Daily  . polyethylene glycol  17 g Oral BID  . predniSONE  5 mg Oral Q breakfast  . pregabalin  200 mg Oral TID  . rivaroxaban  10 mg Oral Daily  . sodium chloride flush  10-40 mL Intracatheter Q12H    Objective: Vital signs in last 24 hours: Temp:  [98.3  F (36.8 C)-103.2 F (39.6 C)] 99 F (37.2 C) (06/20 0422) Pulse Rate:  [98-131] 98 (06/20 0557) Resp:  [25-39] 25 (06/20 0557) BP: (129-169)/(68-98) 129/68 (06/20 0422) SpO2:  [93 %-100 %] 100 % (06/20 0557)   General appearance: alert, cooperative and mild distress Resp: rhonchi anterior - bilateral Cardio: regularly irregular rhythm GI: normal findings: bowel sounds normal and soft, non-tender  Lab Results Recent Labs    02/26/20 0357 02/27/20 1211 02/27/20 1916 02/28/20 0404  WBC   < >  --  7.7 8.2  HGB   < >  --  8.8* 8.9*  HCT   < >  --  27.7* 28.1*  NA  --  135  --  136  K  --  5.9*  --  3.7  CL  --  104  --  105  CO2  --  17*  --  20*  BUN  --  10  --  9  CREATININE  --  0.62  --  0.80   < > = values in this interval not displayed.   Liver Panel Recent Labs    02/28/20 0404  PROT 5.7*  ALBUMIN 2.1*  AST 86*  ALT 48*  ALKPHOS 78  BILITOT 0.6  BILIDIR 0.2  IBILI 0.4   Sedimentation Rate No results for input(s): ESRSEDRATE in the last  72 hours. C-Reactive Protein No results for input(s): CRP in the last 72 hours.  Microbiology: Recent Results (from the past 240 hour(s))  Blood Culture (routine x 2)     Status: None   Collection Time: 02/21/20  6:33 PM   Specimen: BLOOD LEFT HAND  Result Value Ref Range Status   Specimen Description BLOOD LEFT HAND  Final   Special Requests   Final    BOTTLES DRAWN AEROBIC AND ANAEROBIC Blood Culture adequate volume   Culture   Final    NO GROWTH 5 DAYS Performed at [facility], [address] [place], [place] [address]    Report Status 02/26/2020 FINAL  Final  Blood Culture (routine x 2)     Status: None   Collection Time: 02/21/20  7:37 PM   Specimen: BLOOD RIGHT HAND  Result Value Ref Range Status   Specimen Description BLOOD RIGHT HAND  Final   Special Requests   Final    BOTTLES DRAWN AEROBIC ONLY Blood Culture results may not be optimal due to an excessive volume of blood received in culture bottles   Culture   Final    NO GROWTH 5 DAYS Performed at [facility], [place] [address] [place], [place] [address]    Report Status 02/26/2020 FINAL  Final  MRSA PCR Screening     Status: None   Collection Time: 02/22/20  1:28 AM   Specimen: Nasal Mucosa; Nasopharyngeal  Result Value Ref Range Status   MRSA by PCR NEGATIVE NEGATIVE Final    Comment:        The GeneXpert MRSA Assay (FDA approved for NASAL specimens only), is one component of a comprehensive MRSA colonization surveillance program. It is not intended to diagnose MRSA infection nor to guide or monitor treatment for MRSA infections. Performed at [facility], [place] [address] [place], [place] [address]   Sputum Culture     Status: None   Collection Time: 02/22/20  4:17 AM   Specimen: Sputum  Result Value Ref Range Status   Specimen Description SPUTUM  Final   Special Requests NONE  Final   Sputum evaluation   Final    THIS SPECIMEN IS ACCEPTABLE FOR SPUTUM CULTURE Performed at [facility], [place] [address] [place], [place] [address]    Report Status 02/22/2020 FINAL  Final  Culture, respiratory     Status: None   Collection Time: 02/22/20  4:17 AM   Specimen: SPU  Result Value Ref Range Status   Specimen Description SPUTUM  Final   Special Requests NONE Reflexed from [id number]  Final   Gram Stain   Final    MODERATE WBC PRESENT, PREDOMINANTLY PMN MODERATE GRAM NEGATIVE RODS RARE GRAM POSITIVE COCCI IN CLUSTERS    Culture   Final    MODERATE HAEMOPHILUS INFLUENZAE BETA LACTAMASE NEGATIVE Performed at [facility], [place] [address], [place], [place] [address]    Report Status 02/24/2020 FINAL  Final  Urine culture     Status: Abnormal   Collection Time: 02/22/20  4:31 AM   Specimen: Urine, Random  Result Value Ref Range Status   Specimen Description URINE, RANDOM  Final   Special Requests NONE  Final   Culture (A)  Final    <10,000 COLONIES/mL INSIGNIFICANT GROWTH Performed at [facility], [place] [address], [place], [place] [address]    Report Status 02/23/2020 FINAL  Final  Culture, blood (routine x 2)     Status: None (Preliminary result)   Collection Time: 02/24/20 10:39 AM   Specimen: BLOOD LEFT HAND  Result Value Ref Range Status   Specimen Description BLOOD LEFT HAND  Final   Special Requests   Final    BOTTLES DRAWN AEROBIC ONLY Blood Culture results may not be optimal due to an inadequate volume of blood received in culture bottles   Culture   Final    NO GROWTH 4 DAYS Performed at [facility], [place] [address], [place], [place] [address]    Report Status PENDING  Incomplete  Culture, blood (routine x 2)     Status: None (Preliminary result)   Collection Time: 02/24/20 10:43 AM   Specimen: BLOOD RIGHT HAND  Result Value Ref Range Status   Specimen Description BLOOD RIGHT HAND  Final   Special Requests   Final    BOTTLES DRAWN AEROBIC ONLY Blood Culture results may not be optimal due to an inadequate volume of blood received  in culture bottles   Culture   Final    NO GROWTH 4 DAYS Performed at [facility], [place] [address], [name], [place] [address]    Report Status PENDING  Incomplete  Culture, Urine     Status: None   Collection Time: 02/24/20  6:58 PM   Specimen: Urine, Random  Result Value Ref Range Status   Specimen Description URINE, RANDOM  Final   Special Requests NONE  Final   Culture   Final    NO GROWTH Performed at [facility], [place] [address] [place], [place] [address]    Report Status 02/25/2020 FINAL  Final  Culture, blood (Routine X 2) w Reflex to ID Panel     Status: None (Preliminary result)   Collection Time: 02/26/20  6:38 AM   Specimen: BLOOD RIGHT HAND  Result Value Ref Range Status   Specimen Description BLOOD RIGHT HAND  Final   Special Requests   Final    BOTTLES DRAWN AEROBIC ONLY Blood Culture results may not be optimal due to an inadequate volume of blood received in culture bottles  Culture   Final    NO GROWTH 2 DAYS Performed at [facility], [place] [address] [place], [place] [address]    Report Status PENDING  Incomplete  Culture, blood (Routine X 2) w Reflex to ID Panel     Status: None (Preliminary result)   Collection Time: 02/26/20  7:17 AM   Specimen: BLOOD  Result Value Ref Range Status   Specimen Description BLOOD RIGHT ANTECUBITAL  Final   Special Requests   Final    BOTTLES DRAWN AEROBIC ONLY Blood Culture results may not be optimal due to an inadequate volume of blood received in culture bottles   Culture   Final    NO GROWTH 2 DAYS Performed at [facility], [place] [address], [place], [place] [address]    Report Status PENDING  Incomplete  Urine Culture     Status: None   Collection Time: 02/26/20 10:49 AM   Specimen: Urine, Random  Result Value Ref Range Status   Specimen Description URINE, RANDOM  Final   Special Requests NONE  Final   Culture   Final    NO GROWTH Performed at [facility], [place] [address]  [place], [place] [address]    Report Status 02/27/2020 FINAL  Final    Studies/Results: No results found.   Assessment/Plan: COVID Post-COVID/long COVID syndrome Influenza pna superinfection  Total days of antibiotics: 8 Ceftriaxone  Fever yesterday but none so far today.  tachypnea and tachycardia are better today, she looks more comfortable.  Continue her anbx and supportive care.          Bobby Rumpf MD, FACP Infectious Diseases (pager) [phone] [email or web address] 02/28/2020, 9:47 AM  LOS: 6 days

## 2020-02-29 ENCOUNTER — Inpatient Hospital Stay (HOSPITAL_COMMUNITY): Payer: Medicare Other

## 2020-02-29 ENCOUNTER — Other Ambulatory Visit (HOSPITAL_COMMUNITY): Payer: Medicare Other

## 2020-02-29 DIAGNOSIS — D509 Iron deficiency anemia, unspecified: Secondary | ICD-10-CM

## 2020-02-29 LAB — BASIC METABOLIC PANEL
Anion gap: 14 (ref 5–15)
BUN: 7 mg/dL (ref 6–20)
CO2: 19 mmol/L — ABNORMAL LOW (ref 22–32)
Calcium: 8.4 mg/dL — ABNORMAL LOW (ref 8.9–10.3)
Chloride: 103 mmol/L (ref 98–111)
Creatinine, Ser: 0.84 mg/dL (ref 0.44–1.00)
GFR calc Af Amer: >60 mL/min (ref >60)
GFR calc non Af Amer: >60 mL/min (ref >60)
Glucose, Bld: 102 mg/dL — ABNORMAL HIGH (ref 70–99)
Potassium: 3.6 mmol/L (ref 3.5–5.1)
Sodium: 136 mmol/L (ref 135–145)

## 2020-02-29 LAB — CBC
HCT: 28.5 % — ABNORMAL LOW (ref 36.0–46.0)
Hemoglobin: 9.1 g/dL — ABNORMAL LOW (ref 12.0–15.0)
MCH: 21.4 pg — ABNORMAL LOW (ref 26.0–34.0)
MCHC: 31.9 g/dL (ref 30.0–36.0)
MCV: 67.1 fL — ABNORMAL LOW (ref 80.0–100.0)
Platelets: 306 10*3/uL (ref 150–400)
RBC: 4.25 MIL/uL (ref 3.87–5.11)
RDW: 15.9 % — ABNORMAL HIGH (ref 11.5–15.5)
WBC: 7 10*3/uL (ref 4.0–10.5)
nRBC: 0 % (ref 0.0–0.2)

## 2020-02-29 LAB — CULTURE, BLOOD (ROUTINE X 2)
Culture: NO GROWTH
Culture: NO GROWTH

## 2020-02-29 LAB — C-REACTIVE PROTEIN: CRP: 24.8 mg/dL — ABNORMAL HIGH (ref <1.0)

## 2020-02-29 LAB — FERRITIN: Ferritin: 2812 ng/mL — ABNORMAL HIGH (ref 11–307)

## 2020-02-29 MED ORDER — PREGABALIN 100 MG PO CAPS
100.0000 mg | ORAL_CAPSULE | Freq: Three times a day (TID) | ORAL | Status: DC
  Administered 2020-02-29 – 2020-03-03 (×11): 100 mg via ORAL
  Filled 2020-02-29 (×11): qty 1

## 2020-02-29 MED ORDER — SODIUM CHLORIDE 0.9 % IV SOLN: INTRAVENOUS | Status: AC

## 2020-02-29 MED ORDER — FUROSEMIDE 10 MG/ML IJ SOLN
20.0000 mg | Freq: Once | INTRAMUSCULAR | Status: AC
  Administered 2020-02-29: 20 mg via INTRAVENOUS
  Filled 2020-02-29: qty 2

## 2020-02-29 NOTE — Progress Notes (Signed)
PT Cancellation Note  Patient Details Name: Andrea Berry MRN: [id number] DOB: December 19, 1965   Cancelled Treatment:    Reason Eval/Treat Not Completed: Medical issues which prohibited therapy  Per RN, pt "feels terrible" and continues to run a fever with elevated HR (119 at rest). She recommended no PT at this time.   Arby Barrette, PT Pager [phone]   Rexanne Mano 02/29/2020, 4:17 PM

## 2020-02-29 NOTE — Progress Notes (Addendum)
Family Medicine Teaching Service Daily Progress Note Intern Pager: [phone]  Patient name: Andrea Berry Medical record number: [id number] Date of birth: 10/16/1965 Age: 54 y.o. Gender: female  Primary Care Provider: McDiarmid, Blane Ohara, MD Consultants: None Code Status: Full  Pt Overview and Major Events to Date:  6/13 - admitted 6/18- ID consulted, transitioned back to Ceftriaxone for H. Influenzae pneumonia   Assessment and Plan: [name] is a 54 y.o. female presenting with acute on chronic respiratory failure likely due to superimposed H. Influenzae pneumoniae in setting of prolonged COVID19. PMH is significant for legal blindness, SLE, chronic interstitial lung disease,hx of PE on Xarelto, chronic central neuropathic pain, mild cognitive impairment, mood disorder, neuromyelitis optica, steroid-induced osteopenia, migraine without aura.  H. Influenza pneumoniae and prolonged COVID19 resulting in acute on chronic hypoxemic respiratory failure  Diagnosed with COVID-19 on 5/27.   Chest CT showed multifocal bilateral pneumonia with no significant change since previous x-ray, trace bilateral pleural effusions with minimal dependent lower lobe atelectasis. Infectious disease was consulted who feels this is most likely due to H influenza superimposed upon COVID-19 infection with persistent inflammatory response.  Recommend ceftriaxone alone and continue isolation.  Patient continued with some fevers overnight to 103.1.  Currently on 4 L nasal cannula satting 94%. -Continuous pulse ox/cardiac monitoring, goal O2 >92% -Continue ceftriaxone alone for a minimum of 7 days, will plan for transition to PO as patient improves - ID consulted 6/18, continue Ceftriaxone, possibly prolonged COVID19 -Continue isolation -We will check echocardiogram -We will check IL-6, ferritin, C-reactive protein  Persistent fevers likely due to above, other causes considered include SLE flare (negative markers with  normal C3/4), serotonin syndrome. No new medications, has been on pregabalin and SNRI for >2 years. Will monitor and treat only symptomatic fevers. -Monitor for fevers, treat fevers if symptomatic  Constipation Patient states even with MiraLAX she has been unable to have a bowel movement.  She states she does not think she has had one since this hospitalization.  Discussed with patient about attempting soapsuds enema which patient agreed to.  Patient was able to have a bowel movement yesterday after the soapsuds enema. -Continue MiraLAX scheduled -We will repeat soapsuds enema per patient request.  Anemia Patient CBC had a hemoglobin 9.1 today from 10.5 on 6/13.  Hemoglobin 6/18 was 9.2.  Patient not endorsing any sources of bleeding.  Iron panel shows iron reduced at 6, saturation ratio reduced at 3, ferritin increased at 942 suggestive of anemia of chronic disease.  Patient's ferritin can also be increased as an acute phase reactant in the setting of inflammation however with the patient's history of lupus anemia of chronic disease seems most consistent. -C3 and C4 not decreased which suggest against a lupus flare.  -Double-stranded DNA negative   AKI -  resolved Creatinine on arrival 1.27>0.84 currently.  Creatinine at discharge on 6/1 was 0.87.  Most likely perfusion related. -Monitor vital signs -Avoid nephrotoxic agents  Neuromyelitis optica Patient is legally blind Currently following with ophthalmology.  No acute changes in vision -Continue home ophthalmic solution  SLE Follows with [facility] rheumatology.  She was on hydroxychloroquine but is not taking it per her ophthalmologist request.  Home medication includes prednisone 5 mg daily and she receives rituximab infusions every 6 months.  While hospitalized for Covid 2 weeks ago she received a course of Decadron. -Restarted patient's home prednisone after discontinued Decadron  History of PE PE occurred in 2017.  Home  medications include Xarelto  10 mg daily.  Patient negative for antiphospholipid syndrome -Continue home Xarelto  History of transverse myelitis, Devic's disease Patient pain developed in 2017.  Chronic left upper and lower extremity weakness.  Home medications include tramadol, Neurontin, Lyrica, Trileptal. -Continue home schedule Trileptal -Reduce patient's Lyrica to 100 mg 3 times per day -On tramadol as needed  Chronic interstitial lung disease Patient sees pulmonology as well as asthma and allergy.  PFT on 11/24/2018 unremarkable.  Per chart review from previous admission the patient had PFT performed December 2020 which they were unable to find records for.  Home medication includes Flonase nasal spray.  Insomnia Home medication includes Ativan 0.5 mg nightly. -Melatonin as needed  Prednisone-induced osteoporosis Patient is on alendronate diet at home, is supposed to be on 2-year holiday given longstanding use  Mood disorder Home medication includes Cymbalta 60 mg daily -Continue home Cymbalta  Hypertension Most recent blood pressure 122/79.  Patient was previously recommended to take Norvasc by cardiology but it does not appear that she is on any antihypertensive at this time. -Continue to monitor  Aortic regurgitation Patient had evidence on TEE with no evidence of structural changes.  No treatment necessary per cardiology.  History of CVA Patient had CVA in 2008.  Home medications include Xarelto -Continue home Xarelto, no antiplatelets  FEN/GI: regular diet PPx: Xarelto  Disposition: Pending clinical improvement    Subjective:  Patient states she feels her breathing is unchanged from yesterday.  She denies any pain in her mouth, or new pains anywhere else.  She states she was able to have a small bowel movement after the enema yesterday but states she feels like she is still constipated and would like to have another one today.  Objective: Temp:  [97.9 F  (36.6 C)-103.3 F (39.6 C)] 98.6 F (37 C) (06/21 0600) Pulse Rate:  [98-136] 120 (06/21 0600) Resp:  [24-44] 35 (06/21 0600) BP: (111-158)/(57-82) 122/79 (06/21 0600) SpO2:  [90 %-97 %] 94 % (06/21 0600) Physical Exam: General: Appears unwell but in no current distress.  Patient edentulous with no signs of oral injury or abscess.   Heart: Tachycardic rate with regular sounding rhythm and no murmurs appreciated Lungs: Crackles present bilaterally in the bases, nasal cannula in place Abdomen: Bowel sounds present, no abdominal pain, mildly distended Skin: Warm and dry  Laboratory: Recent Labs  Lab 02/27/20 1916 02/28/20 0404 02/29/20 0307  WBC 7.7 8.2 7.0  HGB 8.8* 8.9* 9.1*  HCT 27.7* 28.1* 28.5*  PLT 323 282 306   Recent Labs  Lab 02/27/20 1211 02/28/20 0404 02/29/20 0307  NA 135 136 136  K 5.9* 3.7 3.6  CL 104 105 103  CO2 17* 20* 19*  BUN 10 9 7   CREATININE 0.62 0.80 0.84  CALCIUM 8.3* 8.6* 8.4*  PROT  --  5.7*  --   BILITOT  --  0.6  --   ALKPHOS  --  78  --   ALT  --  48*  --   AST  --  86*  --   GLUCOSE 102* 99 102*    Imaging/Diagnostic Tests: CT Head Wo Contrast  Result Date: 02/04/2020 CLINICAL DATA:  Acute pain due to trauma. EXAM: CT HEAD WITHOUT CONTRAST TECHNIQUE: Contiguous axial images were obtained from the base of the skull through the vertex without intravenous contrast. COMPARISON:  MRI dated November 25, 2015. CT head dated September 21, 2015. FINDINGS: Brain: No evidence of acute infarction, hemorrhage, hydrocephalus, extra-axial collection or mass lesion/mass effect.  Vascular: No hyperdense vessel or unexpected calcification. Skull: Normal. Negative for fracture or focal lesion. Sinuses/Orbits: There is pansinus mucosal thickening. The mastoid air cells are essentially clear. Other: None. IMPRESSION: 1. No acute intracranial abnormality. 2. Pansinus mucosal thickening. Electronically Signed   By: Constance Holster M.D.   On: 02/04/2020 17:52   CT  CHEST W CONTRAST  Result Date: 02/25/2020 CLINICAL DATA:  Pneumonia EXAM: CT CHEST WITH CONTRAST TECHNIQUE: Multidetector CT imaging of the chest was performed during intravenous contrast administration. CONTRAST:  154mL OMNIPAQUE IOHEXOL 300 MG/ML  SOLN COMPARISON:  02/24/2020 FINDINGS: Cardiovascular: Heart is unremarkable without pericardial effusion. Normal caliber of the thoracic aorta. No significant atherosclerosis. Mediastinum/Nodes: No enlarged mediastinal, hilar, or axillary lymph nodes. Thyroid gland, trachea, and esophagus demonstrate no significant findings. Lungs/Pleura: There is multifocal bilateral airspace disease, most pronounced within the left upper lobe. Trace bilateral pleural effusions are noted. Minimal dependent lower lobe atelectasis. No pneumothorax. Central airways are patent. Upper Abdomen: No acute abnormality. Musculoskeletal: No acute or destructive bony lesions. Reconstructed images demonstrate no additional findings. IMPRESSION: 1. Multifocal bilateral pneumonia, not significantly changed since previous chest x-ray. 2. Trace bilateral pleural effusions and minimal dependent lower lobe atelectasis. Electronically Signed   By: Randa Ngo M.D.   On: 02/25/2020 23:57   DG CHEST PORT 1 VIEW  Result Date: 02/24/2020 CLINICAL DATA:  Fever EXAM: PORTABLE CHEST 1 VIEW COMPARISON:  February 21, 2020 FINDINGS: There is airspace opacity throughout the right mid and lower lung regions as well as in the left mid lung and left base regions. There is atelectatic change in the medial right base. There is a probable small left pleural effusion. Heart is slightly enlarged with pulmonary vascularity normal. No adenopathy. No bone lesions. IMPRESSION: Multifocal pneumonia, slightly increased in the left mid lung and stable elsewhere. Probable small left pleural effusion. Medial right base atelectasis. Stable cardiac prominence.  No adenopathy evident. Electronically Signed   By: Lowella Grip  III M.D.   On: 02/24/2020 10:20   DG Chest Port 1 View  Result Date: 02/21/2020 CLINICAL DATA:  Shortness of breath.  COVID positive. EXAM: PORTABLE CHEST 1 VIEW COMPARISON:  Feb 04, 2020 FINDINGS: There are hazy bilateral airspace opacities, greatest at the lung bases bilaterally. There is no pneumothorax. There are small bilateral pleural effusions, right greater than left. The cardiac silhouette appears enlarged but stable from prior study. There is no acute osseous abnormality. IMPRESSION: 1. Multifocal pneumonia (viral or bacterial). 2. Small bilateral pleural effusions, right greater than left. Electronically Signed   By: Constance Holster M.D.   On: 02/21/2020 19:09   DG Chest Port 1 View  Result Date: 02/04/2020 CLINICAL DATA:  COVID-19.  Cough.  Shortness of breath. EXAM: PORTABLE CHEST 1 VIEW COMPARISON:  11/06/2018 FINDINGS: The heart size and pulmonary vascularity are normal. Slight haziness at the left lung base laterally. Chronic scarring at the right lung base. No effusions. No bone abnormality of significance. IMPRESSION: Slight haziness at the left lung base laterally which could represent a small area of infiltrate. Scarring at the right lung base. Electronically Signed   By: Lorriane Shire M.D.   On: 02/04/2020 14:48    Lurline Del, DO 02/29/2020, 6:35 AM PGY-1, [place] Intern pager: [phone], text pages welcome

## 2020-02-29 NOTE — Progress Notes (Signed)
Patient ID: Andrea Berry, female   DOB: 1966/07/14, 54 y.o.   MRN: [id number]         [facility] for Infectious Disease  Date of Admission:  02/21/2020           Day 9 antibiotics  ASSESSMENT: I still suspect that her hectic fevers are a remnant of Covid 19 infection.  She has not responded to empiric antibiotics for superimposed bacterial pneumonia.  I will stop ceftriaxone now.  PLAN: 1. Discontinue ceftriaxone and observe off of antibiotics  Principal Problem:   Fever Active Problems:   Pneumonia   History of COVID-19   Legal blindness   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HCC)   Chronic central neuropathic pain   Mild cognitive impairment   SLE (systemic lupus erythematosus related syndrome) (HCC)   History of pulmonary embolism   ILD (interstitial lung disease) (HCC)   COVID-19   Microcytic anemia   Scheduled Meds: . DULoxetine  60 mg Oral Daily  . ipratropium  2 spray Each Nare Q12H  . ketotifen  1 drop Both Eyes BID  . linaclotide  145 mcg Oral QAC breakfast  . mouth rinse  15 mL Mouth Rinse BID  . pantoprazole  40 mg Oral Daily  . polyethylene glycol  17 g Oral BID  . predniSONE  5 mg Oral Q breakfast  . pregabalin  100 mg Oral TID  . rivaroxaban  10 mg Oral Daily  . sodium chloride flush  10-40 mL Intracatheter Q12H   Continuous Infusions: . cefTRIAXone (ROCEPHIN)  IV 2 g (02/29/20 1109)   PRN Meds:.acetaminophen, famotidine, fluticasone, ondansetron (ZOFRAN) IV, sodium chloride flush   SUBJECTIVE: She is feeling better than she was upon admission.  She is eager to go home.  She feels like her breathing is at her baseline.  She is not aware of her fevers unless she is told about them.  Review of Systems: Review of Systems  Constitutional: Positive for fever. Negative for chills and diaphoresis.  Respiratory: Positive for cough. Negative for hemoptysis, sputum production and shortness of breath.        Oxygen saturation is 96% on her baseline nasal  prong oxygen at 2 L.  Cardiovascular: Negative for chest pain.  Gastrointestinal: Positive for constipation. Negative for abdominal pain, diarrhea, nausea and vomiting.  Genitourinary: Negative for dysuria.  Musculoskeletal: Negative for back pain and joint pain.  Neurological: Negative for headaches.    No Known Allergies  OBJECTIVE: Vitals:   02/29/20 0600 02/29/20 0726 02/29/20 1047 02/29/20 1152  BP: 122/79 (!) 143/79 (!) 150/70 127/64  Pulse: (!) 120  (!) 133 (!) 124  Resp: (!) 35 (!) 25 (!) 40 (!) 30  Temp: 98.6 F (37 C) 99.8 F (37.7 C) (!) 100.8 F (38.2 C) 99.3 F (37.4 C)  TempSrc: Oral Oral Oral Oral  SpO2: 94% 97% 90% 96%  Weight:      Height:       Body mass index is 36.78 kg/m.  Physical Exam Constitutional:      Comments: She is resting quietly in bed.  Cardiovascular:     Rate and Rhythm: Regular rhythm. Tachycardia present.     Heart sounds: No murmur heard.   Pulmonary:     Effort: Pulmonary effort is normal.     Breath sounds: Normal breath sounds.  Abdominal:     Palpations: Abdomen is soft.     Tenderness: There is no abdominal tenderness.  Musculoskeletal:  General: No swelling or tenderness.  Skin:    Findings: No rash.  Psychiatric:        Mood and Affect: Mood normal.     Lab Results Lab Results  Component Value Date   WBC 7.0 02/29/2020   HGB 9.1 (L) 02/29/2020   HCT 28.5 (L) 02/29/2020   MCV 67.1 (L) 02/29/2020   PLT 306 02/29/2020    Lab Results  Component Value Date   CREATININE 0.84 02/29/2020   BUN 7 02/29/2020   NA 136 02/29/2020   K 3.6 02/29/2020   CL 103 02/29/2020   CO2 19 (L) 02/29/2020    Lab Results  Component Value Date   ALT 48 (H) 02/28/2020   AST 86 (H) 02/28/2020   ALKPHOS 78 02/28/2020   BILITOT 0.6 02/28/2020     Microbiology: Recent Results (from the past 240 hour(s))  Blood Culture (routine x 2)     Status: None   Collection Time: 02/21/20  6:33 PM   Specimen: BLOOD LEFT HAND    Result Value Ref Range Status   Specimen Description BLOOD LEFT HAND  Final   Special Requests   Final    BOTTLES DRAWN AEROBIC AND ANAEROBIC Blood Culture adequate volume   Culture   Final    NO GROWTH 5 DAYS Performed at [facility], [address] [place], [place] [address]    Report Status 02/26/2020 FINAL  Final  Blood Culture (routine x 2)     Status: None   Collection Time: 02/21/20  7:37 PM   Specimen: BLOOD RIGHT HAND  Result Value Ref Range Status   Specimen Description BLOOD RIGHT HAND  Final   Special Requests   Final    BOTTLES DRAWN AEROBIC ONLY Blood Culture results may not be optimal due to an excessive volume of blood received in culture bottles   Culture   Final    NO GROWTH 5 DAYS Performed at [facility], [place] [address], [place], [place] [address]    Report Status 02/26/2020 FINAL  Final  MRSA PCR Screening     Status: None   Collection Time: 02/22/20  1:28 AM   Specimen: Nasal Mucosa; Nasopharyngeal  Result Value Ref Range Status   MRSA by PCR NEGATIVE NEGATIVE Final    Comment:        The GeneXpert MRSA Assay (FDA approved for NASAL specimens only), is one component of a comprehensive MRSA colonization surveillance program. It is not intended to diagnose MRSA infection nor to guide or monitor treatment for MRSA infections. Performed at [facility], [place] [address], [place], [place] [address]   Sputum Culture     Status: None   Collection Time: 02/22/20  4:17 AM   Specimen: Sputum  Result Value Ref Range Status   Specimen Description SPUTUM  Final   Special Requests NONE  Final   Sputum evaluation   Final    THIS SPECIMEN IS ACCEPTABLE FOR SPUTUM CULTURE Performed at [facility], [address] [place], [place] [address]    Report Status 02/22/2020 FINAL  Final  Culture, respiratory     Status: None   Collection Time: 02/22/20  4:17 AM   Specimen: SPU  Result Value Ref Range Status   Specimen Description SPUTUM   Final   Special Requests NONE Reflexed from [id number]  Final   Gram Stain   Final    MODERATE WBC PRESENT, PREDOMINANTLY PMN MODERATE GRAM NEGATIVE RODS RARE  GRAM POSITIVE COCCI IN CLUSTERS    Culture   Final    MODERATE HAEMOPHILUS INFLUENZAE BETA LACTAMASE NEGATIVE Performed at [facility], [place] [address] [place], [place] [address]    Report Status 02/24/2020 FINAL  Final  Urine culture     Status: Abnormal   Collection Time: 02/22/20  4:31 AM   Specimen: Urine, Random  Result Value Ref Range Status   Specimen Description URINE, RANDOM  Final   Special Requests NONE  Final   Culture (A)  Final    <10,000 COLONIES/mL INSIGNIFICANT GROWTH Performed at [facility], [place] [address], [place], [place] [address]    Report Status 02/23/2020 FINAL  Final  Culture, blood (routine x 2)     Status: None   Collection Time: 02/24/20 10:39 AM   Specimen: BLOOD LEFT HAND  Result Value Ref Range Status   Specimen Description BLOOD LEFT HAND  Final   Special Requests   Final    BOTTLES DRAWN AEROBIC ONLY Blood Culture results may not be optimal due to an inadequate volume of blood received in culture bottles   Culture   Final    NO GROWTH 5 DAYS Performed at [facility], [place] [address] [place], [place] [address]    Report Status 02/29/2020 FINAL  Final  Culture, blood (routine x 2)     Status: None   Collection Time: 02/24/20 10:43 AM   Specimen: BLOOD RIGHT HAND  Result Value Ref Range Status   Specimen Description BLOOD RIGHT HAND  Final   Special Requests   Final    BOTTLES DRAWN AEROBIC ONLY Blood Culture results may not be optimal due to an inadequate volume of blood received in culture bottles   Culture   Final    NO GROWTH 5 DAYS Performed at [facility], [place] [address] [place], [place] [address]    Report Status 02/29/2020 FINAL  Final  Culture, Urine     Status: None   Collection Time: 02/24/20  6:58 PM   Specimen: Urine, Random  Result Value Ref  Range Status   Specimen Description URINE, RANDOM  Final   Special Requests NONE  Final   Culture   Final    NO GROWTH Performed at [facility], [place] [address], [place], [place] [address]    Report Status 02/25/2020 FINAL  Final  Culture, blood (Routine X 2) w Reflex to ID Panel     Status: None (Preliminary result)   Collection Time: 02/26/20  6:38 AM   Specimen: BLOOD RIGHT HAND  Result Value Ref Range Status   Specimen Description BLOOD RIGHT HAND  Final   Special Requests   Final    BOTTLES DRAWN AEROBIC ONLY Blood Culture results may not be optimal due to an inadequate volume of blood received in culture bottles   Culture   Final    NO GROWTH 3 DAYS Performed at [place], [place] [address] [place], [place] [address]    Report Status PENDING  Incomplete  Culture, blood (Routine X 2) w Reflex to ID Panel     Status: None (Preliminary result)   Collection Time: 02/26/20  7:17 AM   Specimen: BLOOD  Result Value Ref Range Status   Specimen Description BLOOD RIGHT ANTECUBITAL  Final   Special Requests   Final    BOTTLES DRAWN AEROBIC ONLY Blood Culture results may not be optimal due to an inadequate volume of blood  received in culture bottles   Culture   Final    NO GROWTH 3 DAYS Performed at [facility], [place] [address] [place], [place] [address]    Report Status PENDING  Incomplete  Urine Culture     Status: None   Collection Time: 02/26/20 10:49 AM   Specimen: Urine, Random  Result Value Ref Range Status   Specimen Description URINE, RANDOM  Final   Special Requests NONE  Final   Culture   Final    NO GROWTH Performed at [facility], [place] [address], [place], [place] [address]    Report Status 02/27/2020 FINAL  Final    Michel Bickers, MD [facility] for Infectious [place] [phone] pager   [phone] cell 02/29/2020, 1:55 PM

## 2020-02-29 NOTE — Progress Notes (Signed)
Pt temperature at 100.62F. MD paged. PRN tylenol administered.

## 2020-02-29 NOTE — Progress Notes (Signed)
  Patient's temp was 103.3 f oral. Notified  family medicine teaching service on call provider. Given NS bolus 150 ml as per order. Recheck temp was 101.1 f. Will continue monitor.

## 2020-02-29 NOTE — Progress Notes (Signed)
Teaching services paged on pt sustained HR in 120s. Page answered by intern who said he would ask resident MD overseeing pt on any new orders. Will continue to monitor.

## 2020-02-29 NOTE — Progress Notes (Signed)
Occupational Therapy Treatment Patient Details Name: Andrea Berry MRN: [id number] DOB: 08-15-1966 Today's Date: 02/29/2020    History of present illness 54 y.o. female presented to ED 02/21/20 with shortness of breath 2/2 recent Covid infection (5/27) with hospitalization 5/27-6/1. PMH is significant for neuromyelitis optica/legal blindness, SLE, chronic interstitial lung disease,hx of PE, chronic central neuropathic pain, mild cognitive impairment, mood disorder, steroid-induced osteopenia, migraine without aura, HTN, CVA, transverse myelitis with chronic LUE, LLE weakness   OT comments  Patient seen for limited session today due to fever and vitals (see below).  Patient needing to use toilet and required min assist with trs to [facility] and toileting.  Patient seen with RN as precaution.  Will continue to follow with OT acutely to address the deficits listed below.    Follow Up Recommendations  No OT follow up;Supervision - Intermittent    Equipment Recommendations  None recommended by OT    Recommendations for Other Services      Precautions / Restrictions Precautions Precautions: Fall Precaution Comments: legally blind. Monitor RR, HR, and O2 Restrictions Weight Bearing Restrictions: No       Mobility Bed Mobility Overal bed mobility: Needs Assistance Bed Mobility: Supine to Sit;Sit to Supine     Supine to sit: Min assist Sit to supine: Min assist      Transfers Overall transfer level: Needs assistance Equipment used: 2 person hand held assist Transfers: Sit to/from [facility] Sit to Stand: Min guard Stand pivot transfers: Min assist            Balance Overall balance assessment: Needs assistance Sitting-balance support: No upper extremity supported;Feet unsupported Sitting balance-Leahy Scale: Fair     Standing balance support: Bilateral upper extremity supported Standing balance-Leahy Scale: Poor                             ADL  either performed or assessed with clinical judgement   ADL Overall ADL's : Needs assistance/impaired                         Toilet Transfer: Minimal assistance;BSC   Toileting- Clothing Manipulation and Hygiene: Minimal assistance;Sit to/from stand       Functional mobility during ADLs: Minimal assistance       Vision       Perception     Praxis      Cognition Arousal/Alertness: Awake/alert Behavior During Therapy: Flat affect Overall Cognitive Status: Within Functional Limits for tasks assessed                                          Exercises     Shoulder Instructions       General Comments Patient had fever today.  HR resting in mid 130s, 150 with mobility.  SpO2 86 resting on 2L and 75 with mobility.  Saw patient with RN     Pertinent Vitals/ Pain       Pain Assessment: Faces Faces Pain Scale: Hurts little more Pain Location: general discomfort Pain Descriptors / Indicators: Discomfort Pain Intervention(s): Limited activity within patient's tolerance;Monitored during session  Home Living  Prior Functioning/Environment              Frequency  Min 2X/week        Progress Toward Goals  OT Goals(current goals can now be found in the care plan section)  Progress towards OT goals: Progressing toward goals  Acute Rehab OT Goals Patient Stated Goal: to get better and get back home OT Goal Formulation: With patient Time For Goal Achievement: 03/07/20 Potential to Achieve Goals: Good  Plan Discharge plan remains appropriate    Co-evaluation                 AM-PAC OT "6 Clicks" Daily Activity     Outcome Measure   Help from another person eating meals?: A Little Help from another person taking care of personal grooming?: A Little Help from another person toileting, which includes using toliet, bedpan, or urinal?: A Little Help from another person  bathing (including washing, rinsing, drying)?: A Little Help from another person to put on and taking off regular upper body clothing?: A Little Help from another person to put on and taking off regular lower body clothing?: A Little 6 Click Score: 18    End of Session Equipment Utilized During Treatment: Oxygen  OT Visit Diagnosis: Unsteadiness on feet (R26.81);Other abnormalities of gait and mobility (R26.89);Muscle weakness (generalized) (M62.81)   Activity Tolerance Patient tolerated treatment well   Patient Left with call bell/phone within reach;in bed   Nurse Communication Mobility status        Time: 5859-2924 OT Time Calculation (min): 11 min  Charges: OT General Charges $OT Visit: 1 Visit OT Treatments $Self Care/Home Management : 8-22 mins  August Luz, OTR/L    Phylliss Bob 02/29/2020, 3:40 PM

## 2020-02-29 NOTE — Progress Notes (Signed)
Charge RN notified of pts continued red MEWS from elevated HR and RR along with intermittent fevers. Cooling blanket ordered. Rapid RN consulted on pts status. Red MEWS protocol still ongoing and continued from last shift.    02/29/20 1047  Assess: MEWS Score  Temp (!) 100.8 F (38.2 C)  BP (!) 150/70  Pulse Rate (!) 133  ECG Heart Rate (!) 132  Resp (!) 40  Level of Consciousness Alert  SpO2 90 %  O2 Device Nasal Cannula  Patient Activity (if Appropriate) In bed  O2 Flow Rate (L/min) 2 L/min  Assess: MEWS Score  MEWS Temp 1  MEWS Systolic 0  MEWS Pulse 3  MEWS RR 3  MEWS LOC 0  MEWS Score 7  MEWS Score Color Red  Assess: if the MEWS score is Yellow or Red  Were vital signs taken at a resting state? Yes  Focused Assessment Documented focused assessment  Early Detection of Sepsis Score *See Row Information* Medium  MEWS guidelines implemented *See Row Information* No, previously red, continue vital signs every 4 hours (rechecked after ccmd verification)  Treat  MEWS Interventions Escalated (See documentation below)  Escalate  MEWS: Escalate Red: discuss with charge nurse/RN and provider, consider discussing with RRT  Notify: Charge Nurse/RN  Name of Charge Nurse/RN Notified Ramonita Lab RN  Date Charge Nurse/RN Notified 02/29/20  Time Charge Nurse/RN Notified 1050  Notify: Rapid Response  Name of Rapid Response RN Notified Helle RN  Date Rapid Response Notified 02/29/20  Time Rapid Response Notified 4239  Document  Patient Outcome Other (Comment)

## 2020-03-01 ENCOUNTER — Other Ambulatory Visit: Payer: Self-pay | Admitting: Family Medicine

## 2020-03-01 ENCOUNTER — Inpatient Hospital Stay (HOSPITAL_COMMUNITY): Payer: Medicare Other

## 2020-03-01 ENCOUNTER — Ambulatory Visit: Payer: Medicare Other

## 2020-03-01 DIAGNOSIS — R7989 Other specified abnormal findings of blood chemistry: Secondary | ICD-10-CM

## 2020-03-01 DIAGNOSIS — R9431 Abnormal electrocardiogram [ECG] [EKG]: Secondary | ICD-10-CM

## 2020-03-01 LAB — CBC WITH DIFFERENTIAL/PLATELET
Abs Immature Granulocytes: 0 10*3/uL (ref 0.00–0.07)
Basophils Absolute: 0 10*3/uL (ref 0.0–0.1)
Basophils Relative: 0 %
Eosinophils Absolute: 0 10*3/uL (ref 0.0–0.5)
Eosinophils Relative: 0 %
HCT: 27.3 % — ABNORMAL LOW (ref 36.0–46.0)
Hemoglobin: 8.6 g/dL — ABNORMAL LOW (ref 12.0–15.0)
Lymphocytes Relative: 12 %
Lymphs Abs: 0.9 10*3/uL (ref 0.7–4.0)
MCH: 21.1 pg — ABNORMAL LOW (ref 26.0–34.0)
MCHC: 31.5 g/dL (ref 30.0–36.0)
MCV: 66.9 fL — ABNORMAL LOW (ref 80.0–100.0)
Monocytes Absolute: 0.3 10*3/uL (ref 0.1–1.0)
Monocytes Relative: 4 %
Neutro Abs: 6 10*3/uL (ref 1.7–7.7)
Neutrophils Relative %: 84 %
Platelets: 318 10*3/uL (ref 150–400)
RBC: 4.08 MIL/uL (ref 3.87–5.11)
RDW: 15.9 % — ABNORMAL HIGH (ref 11.5–15.5)
WBC: 7.1 10*3/uL (ref 4.0–10.5)
nRBC: 0 % (ref 0.0–0.2)
nRBC: 0 /100 WBC

## 2020-03-01 LAB — COMPREHENSIVE METABOLIC PANEL
ALT: 113 U/L — ABNORMAL HIGH (ref 0–44)
AST: 270 U/L — ABNORMAL HIGH (ref 15–41)
Albumin: 2.1 g/dL — ABNORMAL LOW (ref 3.5–5.0)
Alkaline Phosphatase: 138 U/L — ABNORMAL HIGH (ref 38–126)
Anion gap: 10 (ref 5–15)
BUN: 7 mg/dL (ref 6–20)
CO2: 23 mmol/L (ref 22–32)
Calcium: 8.8 mg/dL — ABNORMAL LOW (ref 8.9–10.3)
Chloride: 104 mmol/L (ref 98–111)
Creatinine, Ser: 0.74 mg/dL (ref 0.44–1.00)
GFR calc Af Amer: >60 mL/min (ref >60)
GFR calc non Af Amer: >60 mL/min (ref >60)
Glucose, Bld: 110 mg/dL — ABNORMAL HIGH (ref 70–99)
Potassium: 3.6 mmol/L (ref 3.5–5.1)
Sodium: 137 mmol/L (ref 135–145)
Total Bilirubin: 0.9 mg/dL (ref 0.3–1.2)
Total Protein: 5.8 g/dL — ABNORMAL LOW (ref 6.5–8.1)

## 2020-03-01 LAB — SEDIMENTATION RATE: Sed Rate: 96 mm/hr — ABNORMAL HIGH (ref 0–22)

## 2020-03-01 LAB — SAVE SMEAR(SSMR), FOR PROVIDER SLIDE REVIEW

## 2020-03-01 LAB — ECHOCARDIOGRAM COMPLETE
Height: 61 in
Weight: 3114.66 oz

## 2020-03-01 LAB — HEPATIC FUNCTION PANEL
ALT: 111 U/L — ABNORMAL HIGH (ref 0–44)
AST: 273 U/L — ABNORMAL HIGH (ref 15–41)
Albumin: 2.2 g/dL — ABNORMAL LOW (ref 3.5–5.0)
Alkaline Phosphatase: 133 U/L — ABNORMAL HIGH (ref 38–126)
Bilirubin, Direct: 0.3 mg/dL — ABNORMAL HIGH (ref 0.0–0.2)
Indirect Bilirubin: 0.6 mg/dL (ref 0.3–0.9)
Total Bilirubin: 0.9 mg/dL (ref 0.3–1.2)
Total Protein: 6 g/dL — ABNORMAL LOW (ref 6.5–8.1)

## 2020-03-01 LAB — HEPATITIS PANEL, ACUTE
HCV Ab: NONREACTIVE
Hep A IgM: NONREACTIVE
Hep B C IgM: NONREACTIVE
Hepatitis B Surface Ag: NONREACTIVE

## 2020-03-01 LAB — TROPONIN I (HIGH SENSITIVITY): Troponin I (High Sensitivity): 25 ng/L — ABNORMAL HIGH (ref <18)

## 2020-03-01 LAB — LACTATE DEHYDROGENASE: LDH: 751 U/L — ABNORMAL HIGH (ref 98–192)

## 2020-03-01 NOTE — Progress Notes (Signed)
   03/01/20 0334  Assess: MEWS Score  Temp 99.5 F (37.5 C)  BP 124/66  Pulse Rate (!) 115  Resp (!) 32  Level of Consciousness Alert  SpO2 99 %  O2 Device Nasal Cannula  O2 Flow Rate (L/min) 2 L/min  Assess: MEWS Score  MEWS Temp 0  MEWS Systolic 0  MEWS Pulse 2  MEWS RR 2  MEWS LOC 0  MEWS Score 4  MEWS Score Color Red  Assess: if the MEWS score is Yellow or Red  Were vital signs taken at a resting state? Yes  Focused Assessment Documented focused assessment  Early Detection of Sepsis Score *See Row Information* High  MEWS guidelines implemented *See Row Information* No, previously red, continue vital signs every 4 hours  Document  Patient Outcome Not stable and remains on department  Progress note created (see row info) Yes  Pt continues to have red MEWS with both tachypnea and tachycardia while at rest. Pt resting and states no pain, no SOB, and no labored breathing. Will continue to monitor pt. Norton Blizzard, RN

## 2020-03-01 NOTE — Progress Notes (Signed)
Family Medicine Teaching Service Daily Progress Note Intern Pager: [phone]  Patient name: Andrea Berry Medical record number: [id number] Date of birth: [birth year] Age: 54 y.o. Gender: female  Primary Care Provider: McDiarmid, Blane Ohara, MD Consultants: None Code Status: Full  Pt Overview and Major Events to Date:  6/13 - admitted 6/18- ID consulted, transitioned back to Ceftriaxone for H. Influenzae pneumonia   Assessment and Plan: LORELY BUBB is a 54 y.o. female presenting with acute on chronic respiratory failure likely due to superimposed H. Influenzae pneumoniae in setting of prolonged COVID19. PMH is significant for legal blindness, SLE, chronic interstitial lung disease,hx of PE on Xarelto, chronic central neuropathic pain, mild cognitive impairment, mood disorder, neuromyelitis optica, steroid-induced osteopenia, migraine without aura.  H. Influenza pneumoniae and prolonged COVID19 resulting in acute on chronic hypoxemic respiratory failure  Diagnosed with COVID-19 on 5/27.   Chest CT showed multifocal bilateral pneumonia with no significant change since previous x-ray, trace bilateral pleural effusions with minimal dependent lower lobe atelectasis. Infectious disease was consulted who feels this is most likely due to H influenza superimposed upon COVID-19 infection with persistent inflammatory response.  Recommend ceftriaxone alone and continue isolation.  Patient continued with some fevers overnight to 103.1.  Currently on 4 L nasal cannula satting 94%. CXR repeat shows slight increased ground-glass opacity and consolidation in the right upper lobe though with some clearing of infiltrate from right base. Remaining bilateral pneumonia otherwise unchanged. S/p IV lasix 28m yesterday. -Continuous pulse ox/cardiac monitoring, goal O2 >92%.  Ferritin 942> 2812.  CRP elevated at 24.8.  WBC7.1.  Echo pending. -ID consult, appreciate recommendations. -Discontinue ceftriaxone and observe off  antibiotics per infectious disease  Persistent fevers likely due to above, other causes considered include SLE flare (negative markers with normal C3/4), serotonin syndrome. No new medications, has been on pregabalin and SNRI for >2 years. Will monitor and treat only symptomatic fevers. -Monitor for fevers, treat fevers if symptomatic -We will hold acetaminophen and NSAIDs in the presence of elevated LFTs, can consider small dose of ibuprofen if needed for symptomatic fevers.  LFT elevated: Alk phos 138, AST 270, ALT 113. - RUQ ultrasound - Hep panel - AM INR -We will hold Tylenol and NSAIDs, can consider small dose of ibuprofen if needed for symptomatic fevers.  Constipation Patient states even with MiraLAX she has been unable to have a bowel movement.  She states she does not think she has had one since this hospitalization.  Discussed with patient about attempting soapsuds enema which patient agreed to.  Patient was able to have a bowel movement 6/20 after the soapsuds enema. -Continue MiraLAX scheduled  Anemia Patient CBC had a hemoglobin 8.6 today from 10.5 on 6/13.  Hemoglobin 6/18 was 9.2.  Patient not endorsing any sources of bleeding.  Iron panel shows iron reduced at 6, saturation ratio reduced at 3, ferritin increased at 942 suggestive of anemia of chronic disease.  Patient's ferritin can also be increased as an acute phase reactant in the setting of inflammation however with the patient's history of lupus anemia of chronic disease seems most consistent. -C3 and C4 not decreased which suggest against a lupus flare.  -Double-stranded DNA negative   AKI -  resolved Creatinine on arrival 1.27, currently 0.74.  Creatinine at discharge on 6/1 was 0.87.  Most likely perfusion related. -Monitor vital signs -Avoid nephrotoxic agents  Neuromyelitis optica Patient is legally blind Currently following with ophthalmology.  No acute changes in vision -Continue home ophthalmic  solution  SLE Follows with [facility] rheumatology.  She was on hydroxychloroquine but is not taking it per her ophthalmologist request.  Home medication includes prednisone 5 mg daily and she receives rituximab infusions every 6 months.  While hospitalized for Covid 2 weeks ago she received a course of Decadron. -Restarted patient's home prednisone after discontinued Decadron - Will recheck C3, C4 - Plan to follow up with patients rheumatologist  History of PE PE occurred in 2017.  Home medications include Xarelto 10 mg daily.  Patient negative for antiphospholipid syndrome -Continue home Xarelto  History of transverse myelitis, Devic's disease Patient pain developed in 2017.  Chronic left upper and lower extremity weakness.  Home medications include tramadol, Neurontin, Lyrica, Trileptal. -Continue home schedule Trileptal -Reduce patient's Lyrica to 100 mg 3 times per day -On tramadol as needed  Chronic interstitial lung disease Patient sees pulmonology as well as asthma and allergy.  PFT on 11/24/2018 unremarkable.  Per chart review from previous admission the patient had PFT performed December 2020 which they were unable to find records for.  Home medication includes Flonase nasal spray.  Insomnia Home medication includes Ativan 0.5 mg nightly. -Melatonin as needed  Prednisone-induced osteoporosis Patient is on alendronate diet at home, is supposed to be on 2-year holiday given longstanding use  Mood disorder Home medication includes Cymbalta 60 mg daily -Continue home Cymbalta  Hypertension Most recent blood pressure 124/66.  Patient was previously recommended to take Norvasc by cardiology but it does not appear that she is on any antihypertensive at this time. -Continue to monitor  Aortic regurgitation Patient had evidence on TEE with no evidence of structural changes.  No treatment necessary per cardiology.  History of CVA Patient had CVA in 2008.  Home  medications include Xarelto -Continue home Xarelto, no antiplatelets  FEN/GI: regular diet PPx: Xarelto  Disposition: Pending clinical improvement    Subjective:  Patient with no new complaints this morning.  States she feels her breathing is about the same as yesterday.  Objective: Temp:  [98.4 F (36.9 C)-102.9 F (39.4 C)] 99.5 F (37.5 C) (06/22 0334) Pulse Rate:  [110-139] 115 (06/22 0334) Resp:  [25-48] 32 (06/22 0334) BP: (120-150)/(59-79) 124/66 (06/22 0334) SpO2:  [83 %-99 %] 99 % (06/22 0334) Physical Exam: General: Appears unwell, no apparent distress with nasal cannula in place Heart: Regular rate and rhythm with no murmurs appreciated Lungs: Crackles present bilateral bases, slightly improved from yesterday, nasal cannula in place Skin: Warm and dry Extremities: No lower extremity edema   Laboratory: Recent Labs  Lab 02/27/20 1916 02/28/20 0404 02/29/20 0307  WBC 7.7 8.2 7.0  HGB 8.8* 8.9* 9.1*  HCT 27.7* 28.1* 28.5*  PLT 323 282 306   Recent Labs  Lab 02/27/20 1211 02/28/20 0404 02/29/20 0307  NA 135 136 136  K 5.9* 3.7 3.6  CL 104 105 103  CO2 17* 20* 19*  BUN 10 9 7   CREATININE 0.62 0.80 0.84  CALCIUM 8.3* 8.6* 8.4*  PROT  --  5.7*  --   BILITOT  --  0.6  --   ALKPHOS  --  78  --   ALT  --  48*  --   AST  --  86*  --   GLUCOSE 102* 99 102*    Imaging/Diagnostic Tests: DG Chest 2 View  Result Date: 02/29/2020 CLINICAL DATA:  Shortness of breath and weakness EXAM: CHEST - 2 VIEW COMPARISON:  02/24/2020, 02/21/2020, 02/04/2020, CT 02/25/2020 FINDINGS: Small bilateral pleural  effusions. Enlarged cardiomediastinal silhouette with central vascular congestion. Diffuse increased interstitial and ground-glass opacity with patchy bilateral consolidations. No pneumothorax. IMPRESSION: 1. Slight increased ground-glass opacity and consolidation in the right upper lobe though with some clearing of infiltrate from right base. Remaining bilateral  pneumonia otherwise unchanged. 2. There is cardiomegaly, vascular congestion and small pleural effusions. There may be an underlying component of mild interstitial edema. Electronically Signed   By: Donavan Foil M.D.   On: 02/29/2020 16:22   CT Head Wo Contrast  Result Date: 02/04/2020 CLINICAL DATA:  Acute pain due to trauma. EXAM: CT HEAD WITHOUT CONTRAST TECHNIQUE: Contiguous axial images were obtained from the base of the skull through the vertex without intravenous contrast. COMPARISON:  MRI dated November 25, 2015. CT head dated September 21, 2015. FINDINGS: Brain: No evidence of acute infarction, hemorrhage, hydrocephalus, extra-axial collection or mass lesion/mass effect. Vascular: No hyperdense vessel or unexpected calcification. Skull: Normal. Negative for fracture or focal lesion. Sinuses/Orbits: There is pansinus mucosal thickening. The mastoid air cells are essentially clear. Other: None. IMPRESSION: 1. No acute intracranial abnormality. 2. Pansinus mucosal thickening. Electronically Signed   By: Constance Holster M.D.   On: 02/04/2020 17:52   CT CHEST W CONTRAST  Result Date: 02/25/2020 CLINICAL DATA:  Pneumonia EXAM: CT CHEST WITH CONTRAST TECHNIQUE: Multidetector CT imaging of the chest was performed during intravenous contrast administration. CONTRAST:  179m OMNIPAQUE IOHEXOL 300 MG/ML  SOLN COMPARISON:  02/24/2020 FINDINGS: Cardiovascular: Heart is unremarkable without pericardial effusion. Normal caliber of the thoracic aorta. No significant atherosclerosis. Mediastinum/Nodes: No enlarged mediastinal, hilar, or axillary lymph nodes. Thyroid gland, trachea, and esophagus demonstrate no significant findings. Lungs/Pleura: There is multifocal bilateral airspace disease, most pronounced within the left upper lobe. Trace bilateral pleural effusions are noted. Minimal dependent lower lobe atelectasis. No pneumothorax. Central airways are patent. Upper Abdomen: No acute abnormality.  Musculoskeletal: No acute or destructive bony lesions. Reconstructed images demonstrate no additional findings. IMPRESSION: 1. Multifocal bilateral pneumonia, not significantly changed since previous chest x-ray. 2. Trace bilateral pleural effusions and minimal dependent lower lobe atelectasis. Electronically Signed   By: MRanda NgoM.D.   On: 02/25/2020 23:57   DG CHEST PORT 1 VIEW  Result Date: 02/24/2020 CLINICAL DATA:  Fever EXAM: PORTABLE CHEST 1 VIEW COMPARISON:  February 21, 2020 FINDINGS: There is airspace opacity throughout the right mid and lower lung regions as well as in the left mid lung and left base regions. There is atelectatic change in the medial right base. There is a probable small left pleural effusion. Heart is slightly enlarged with pulmonary vascularity normal. No adenopathy. No bone lesions. IMPRESSION: Multifocal pneumonia, slightly increased in the left mid lung and stable elsewhere. Probable small left pleural effusion. Medial right base atelectasis. Stable cardiac prominence.  No adenopathy evident. Electronically Signed   By: WLowella GripIII M.D.   On: 02/24/2020 10:20   DG Chest Port 1 View  Result Date: 02/21/2020 CLINICAL DATA:  Shortness of breath.  COVID positive. EXAM: PORTABLE CHEST 1 VIEW COMPARISON:  Feb 04, 2020 FINDINGS: There are hazy bilateral airspace opacities, greatest at the lung bases bilaterally. There is no pneumothorax. There are small bilateral pleural effusions, right greater than left. The cardiac silhouette appears enlarged but stable from prior study. There is no acute osseous abnormality. IMPRESSION: 1. Multifocal pneumonia (viral or bacterial). 2. Small bilateral pleural effusions, right greater than left. Electronically Signed   By: CConstance HolsterM.D.   On: 02/21/2020 19:09  DG Chest Port 1 View  Result Date: 02/04/2020 CLINICAL DATA:  COVID-19.  Cough.  Shortness of breath. EXAM: PORTABLE CHEST 1 VIEW COMPARISON:  11/06/2018  FINDINGS: The heart size and pulmonary vascularity are normal. Slight haziness at the left lung base laterally. Chronic scarring at the right lung base. No effusions. No bone abnormality of significance. IMPRESSION: Slight haziness at the left lung base laterally which could represent a small area of infiltrate. Scarring at the right lung base. Electronically Signed   By: Lorriane Shire M.D.   On: 02/04/2020 14:48    Lurline Del, DO 03/01/2020, 6:12 AM PGY-1, [place] Intern pager: [phone], text pages welcome

## 2020-03-01 NOTE — Progress Notes (Signed)
  Echocardiogram 2D Echocardiogram has been performed.  Andrea Berry 03/01/2020, 11:57 AM

## 2020-03-01 NOTE — Progress Notes (Signed)
Physical Therapy Treatment Patient Details Name: KONA YUSUF MRN: [id number] DOB: 06-22-1966 Today's Date: 03/01/2020    History of Present Illness 54 y.o. female presented to ED 02/21/20 with shortness of breath 2/2 recent Covid infection (5/27) with hospitalization 5/27-6/1. PMH is significant for neuromyelitis optica/legal blindness, SLE, chronic interstitial lung disease,hx of PE, chronic central neuropathic pain, mild cognitive impairment, mood disorder, steroid-induced osteopenia, migraine without aura, HTN, CVA, transverse myelitis with chronic LUE, LLE weakness    PT Comments    Patient not feeling well, but agreeable to "start slow with bed-level activities." Resting HR 109, RR 36, sats 90% on 2L. After breathing and bed exercises, elevated pt into chair position in bed with HOB up to 53 degrees. Patient's RR decr to 30 with sats up to 96%. As preparing to pivot to sit at EOB, echo technician arrived to perform 2D echo at bedside, therefore further mobility deferred (she needed to be lying down for the echo).     Follow Up Recommendations  SNF (medical decline; will continue to assess)     Equipment Recommendations  Other (comment) (to be assessed)    Recommendations for Other Services       Precautions / Restrictions Precautions Precautions: Fall Precaution Comments: legally blind. Monitor RR, HR, and O2 Restrictions Weight Bearing Restrictions: No    Mobility  Bed Mobility               General bed mobility comments: pt transitioned to chair position with HOB 50 degrees, then 53   Transfers                    Ambulation/Gait                 Stairs             Wheelchair Mobility    Modified Rankin (Stroke Patients Only)       Balance                                            Cognition Arousal/Alertness: Awake/alert Behavior During Therapy: Flat affect Overall Cognitive Status: Within Functional Limits  for tasks assessed                                 General Comments: basic cognition only assessed      Exercises Other Exercises Other Exercises: supine-ankle pumps x 10, heelslides x 5 prior to moving into chair position Other Exercises: IS x 4 reps 250-500 ml; rest between each rep    General Comments        Pertinent Vitals/Pain Pain Assessment: Faces Faces Pain Scale: Hurts a little bit Pain Location: general discomfort Pain Descriptors / Indicators: Discomfort Pain Intervention(s): Monitored during session    Home Living                      Prior Function            PT Goals (current goals can now be found in the care plan section) Acute Rehab PT Goals Patient Stated Goal: to get better and get back home Time For Goal Achievement: 03/07/20 Potential to Achieve Goals: Good Progress towards PT goals: Not progressing toward goals - comment (fever, tachycardia, incr RR)    Frequency  Min 3X/week      PT Plan Discharge plan needs to be updated    Co-evaluation              AM-PAC PT "6 Clicks" Mobility   Outcome Measure  Help needed turning from your back to your side while in a flat bed without using bedrails?: A Little Help needed moving from lying on your back to sitting on the side of a flat bed without using bedrails?: A Little Help needed moving to and from a bed to a chair (including a wheelchair)?: A Little Help needed standing up from a chair using your arms (e.g., wheelchair or bedside chair)?: A Little Help needed to walk in hospital room?: A Lot Help needed climbing 3-5 steps with a railing? : Total 6 Click Score: 15    End of Session Equipment Utilized During Treatment: Oxygen Activity Tolerance: Patient limited by fatigue;Treatment limited secondary to medical complications (Comment) (RR 30-37, HR 109-112) Patient left: in bed;Other (comment) (chair position; echo tech to do 2D echo) Nurse Communication:  Mobility status;Other (comment) (at least utilize chair position if refusing OOB) PT Visit Diagnosis: Muscle weakness (generalized) (M62.81);Difficulty in walking, not elsewhere classified (R26.2)     Time: 3888-2800 PT Time Calculation (min) (ACUTE ONLY): 22 min  Charges:  $Therapeutic Exercise: 8-22 mins                      Arby Barrette, PT Pager [phone]    Rexanne Mano 03/01/2020, 12:47 PM

## 2020-03-01 NOTE — Progress Notes (Signed)
Patient ID: Andrea Berry, female   DOB: 04-26-1966, 54 y.o.   MRN: [id number]         [facility] for Infectious Disease  Date of Admission:  02/21/2020           Day 10 antibiotics  ASSESSMENT: I still suspect that her hectic fevers and increased shortness of breath are remnants of Covid 19 infection.  Lupus and interstitial lung disease put her at greater risk for complications and persistent symptoms.  I do not know any evidence that giving a second round of high-dose steroids is warranted or safe.  PLAN: 1. Continue observation off of antibiotics 2. I will sign off but please call if I can be of further assistance while she is here.  Principal Problem:   Fever Active Problems:   Pneumonia   History of COVID-19   Legal blindness   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HCC)   Chronic central neuropathic pain   Mild cognitive impairment   SLE (systemic lupus erythematosus related syndrome) (HCC)   History of pulmonary embolism   ILD (interstitial lung disease) (HCC)   COVID-19   Microcytic anemia   Scheduled Meds: . DULoxetine  60 mg Oral Daily  . ipratropium  2 spray Each Nare Q12H  . ketotifen  1 drop Both Eyes BID  . linaclotide  145 mcg Oral QAC breakfast  . mouth rinse  15 mL Mouth Rinse BID  . pantoprazole  40 mg Oral Daily  . polyethylene glycol  17 g Oral BID  . predniSONE  5 mg Oral Q breakfast  . pregabalin  100 mg Oral TID  . rivaroxaban  10 mg Oral Daily  . sodium chloride flush  10-40 mL Intracatheter Q12H   Continuous Infusions:  PRN Meds:.famotidine, fluticasone, ondansetron (ZOFRAN) IV, sodium chloride flush   SUBJECTIVE: She is feeling better than she was upon admission.  She still notes some increased shortness of breath when she was sitting up in a chair recently.  Review of Systems: Review of Systems  Constitutional: Positive for fever. Negative for chills and diaphoresis.  Respiratory: Positive for cough and shortness of breath. Negative for  hemoptysis and sputum production.   Cardiovascular: Negative for chest pain.  Gastrointestinal: Positive for constipation. Negative for abdominal pain, diarrhea, nausea and vomiting.  Genitourinary: Negative for dysuria.  Musculoskeletal: Negative for back pain and joint pain.  Neurological: Negative for headaches.    No Known Allergies  OBJECTIVE: Vitals:   03/01/20 0334 03/01/20 0618 03/01/20 0835 03/01/20 0957  BP: 124/66   105/60  Pulse: (!) 115 (!) 119  (!) 111  Resp: (!) 32 (!) 33  (!) 27  Temp: 99.5 F (37.5 C) (!) 100.5 F (38.1 C) 99.3 F (37.4 C) 99.3 F (37.4 C)  TempSrc: Oral Oral  Oral  SpO2: 99% 90%  95%  Weight:      Height:       Body mass index is 36.78 kg/m.  Physical Exam Constitutional:      Comments: She is resting quietly in bed.  Cardiovascular:     Rate and Rhythm: Regular rhythm. Tachycardia present.     Heart sounds: No murmur heard.   Pulmonary:     Effort: Pulmonary effort is normal.     Breath sounds: Normal breath sounds.     Comments: Oxygen saturation is 95% at rest on her baseline nasal prong oxygen at 2 L. Abdominal:     Palpations: Abdomen is soft.     Tenderness: There  is no abdominal tenderness.  Musculoskeletal:        General: No swelling or tenderness.  Skin:    Findings: No rash.  Psychiatric:        Mood and Affect: Mood normal.     Lab Results Lab Results  Component Value Date   WBC 7.1 03/01/2020   HGB 8.6 (L) 03/01/2020   HCT 27.3 (L) 03/01/2020   MCV 66.9 (L) 03/01/2020   PLT 318 03/01/2020    Lab Results  Component Value Date   CREATININE 0.74 03/01/2020   BUN 7 03/01/2020   NA 137 03/01/2020   K 3.6 03/01/2020   CL 104 03/01/2020   CO2 23 03/01/2020    Lab Results  Component Value Date   ALT 113 (H) 03/01/2020   AST 270 (H) 03/01/2020   ALKPHOS 138 (H) 03/01/2020   BILITOT 0.9 03/01/2020     Microbiology: Recent Results (from the past 240 hour(s))  Blood Culture (routine x 2)     Status:  None   Collection Time: 02/21/20  6:33 PM   Specimen: BLOOD LEFT HAND  Result Value Ref Range Status   Specimen Description BLOOD LEFT HAND  Final   Special Requests   Final    BOTTLES DRAWN AEROBIC AND ANAEROBIC Blood Culture adequate volume   Culture   Final    NO GROWTH 5 DAYS Performed at [facility], [address] [place], [place] [address]    Report Status 02/26/2020 FINAL  Final  Blood Culture (routine x 2)     Status: None   Collection Time: 02/21/20  7:37 PM   Specimen: BLOOD RIGHT HAND  Result Value Ref Range Status   Specimen Description BLOOD RIGHT HAND  Final   Special Requests   Final    BOTTLES DRAWN AEROBIC ONLY Blood Culture results may not be optimal due to an excessive volume of blood received in culture bottles   Culture   Final    NO GROWTH 5 DAYS Performed at [facility], [place] [address], [place], [place] [address]    Report Status 02/26/2020 FINAL  Final  MRSA PCR Screening     Status: None   Collection Time: 02/22/20  1:28 AM   Specimen: Nasal Mucosa; Nasopharyngeal  Result Value Ref Range Status   MRSA by PCR NEGATIVE NEGATIVE Final    Comment:        The GeneXpert MRSA Assay (FDA approved for NASAL specimens only), is one component of a comprehensive MRSA colonization surveillance program. It is not intended to diagnose MRSA infection nor to guide or monitor treatment for MRSA infections. Performed at [facility], [place] [address] [place], [place] [address]   Sputum Culture     Status: None   Collection Time: 02/22/20  4:17 AM   Specimen: Sputum  Result Value Ref Range Status   Specimen Description SPUTUM  Final   Special Requests NONE  Final   Sputum evaluation   Final    THIS SPECIMEN IS ACCEPTABLE FOR SPUTUM CULTURE Performed at [facility], [address], [place], [place] [address]    Report Status 02/22/2020 FINAL  Final  Culture, respiratory     Status: None   Collection Time: 02/22/20  4:17 AM    Specimen: SPU  Result Value Ref Range Status   Specimen Description SPUTUM  Final   Special Requests NONE Reflexed from [id number]  Final   Gram Stain   Final  MODERATE WBC PRESENT, PREDOMINANTLY PMN MODERATE GRAM NEGATIVE RODS RARE GRAM POSITIVE COCCI IN CLUSTERS    Culture   Final    MODERATE HAEMOPHILUS INFLUENZAE BETA LACTAMASE NEGATIVE Performed at [facility], [address] [place], [place] [address]    Report Status 02/24/2020 FINAL  Final  Urine culture     Status: Abnormal   Collection Time: 02/22/20  4:31 AM   Specimen: Urine, Random  Result Value Ref Range Status   Specimen Description URINE, RANDOM  Final   Special Requests NONE  Final   Culture (A)  Final    <10,000 COLONIES/mL INSIGNIFICANT GROWTH Performed at [facility], [place] [address] [place], [place] [address]    Report Status 02/23/2020 FINAL  Final  Culture, blood (routine x 2)     Status: None   Collection Time: 02/24/20 10:39 AM   Specimen: BLOOD LEFT HAND  Result Value Ref Range Status   Specimen Description BLOOD LEFT HAND  Final   Special Requests   Final    BOTTLES DRAWN AEROBIC ONLY Blood Culture results may not be optimal due to an inadequate volume of blood received in culture bottles   Culture   Final    NO GROWTH 5 DAYS Performed at [facility], [place] [address], [place], [place] [address]    Report Status 02/29/2020 FINAL  Final  Culture, blood (routine x 2)     Status: None   Collection Time: 02/24/20 10:43 AM   Specimen: BLOOD RIGHT HAND  Result Value Ref Range Status   Specimen Description BLOOD RIGHT HAND  Final   Special Requests   Final    BOTTLES DRAWN AEROBIC ONLY Blood Culture results may not be optimal due to an inadequate volume of blood received in culture bottles   Culture   Final    NO GROWTH 5 DAYS Performed at [facility], [place] [address] [place], [place] [address]    Report Status 02/29/2020 FINAL  Final  Culture, Urine     Status: None    Collection Time: 02/24/20  6:58 PM   Specimen: Urine, Random  Result Value Ref Range Status   Specimen Description URINE, RANDOM  Final   Special Requests NONE  Final   Culture   Final    NO GROWTH Performed at [facility], [place] [address] [place], [place] [address]    Report Status 02/25/2020 FINAL  Final  Culture, blood (Routine X 2) w Reflex to ID Panel     Status: None (Preliminary result)   Collection Time: 02/26/20  6:38 AM   Specimen: BLOOD RIGHT HAND  Result Value Ref Range Status   Specimen Description BLOOD RIGHT HAND  Final   Special Requests   Final    BOTTLES DRAWN AEROBIC ONLY Blood Culture results may not be optimal due to an inadequate volume of blood received in culture bottles   Culture   Final    NO GROWTH 4 DAYS Performed at [facility], [place] [address] [place],  [address]    Report Status PENDING  Incomplete  Culture, blood (Routine X 2) w Reflex to ID Panel     Status: None (Preliminary result)   Collection Time: 02/26/20  7:17 AM   Specimen: BLOOD  Result Value Ref Range Status   Specimen Description BLOOD RIGHT ANTECUBITAL  Final   Special Requests   Final    BOTTLES DRAWN AEROBIC ONLY Blood Culture results may  not be optimal due to an inadequate volume of blood received in culture bottles   Culture   Final    NO GROWTH 4 DAYS Performed at [facility], [place] [address] [place], [place] [address]    Report Status PENDING  Incomplete  Urine Culture     Status: None   Collection Time: 02/26/20 10:49 AM   Specimen: Urine, Random  Result Value Ref Range Status   Specimen Description URINE, RANDOM  Final   Special Requests NONE  Final   Culture   Final    NO GROWTH Performed at [facility], [place] [address], [place],  [address]    Report Status 02/27/2020 FINAL  Final    Michel Bickers, MD [facility] [phone] pager   [phone] cell 03/01/2020, 11:38  AM

## 2020-03-01 NOTE — Progress Notes (Signed)
Spoke with Dr. Carmin Muskrat with [facility] rheumatology regarding Andrea Berry and any recommendations they may have.  Dr. Carmin Muskrat agreed with our rechecking C3, C4 and echo and recommended adding a recheck of UA, protein creatinine ratio, troponin, double-stranded DNA, ESR, peripheral smear, haptoglobin, and lactate dehydrogenase. Greatly appreciate Dr. Carmin Muskrat and [facility] taking time to provide their expertise to assist with Andrea Berry.

## 2020-03-02 ENCOUNTER — Encounter: Payer: Self-pay | Admitting: Family Medicine

## 2020-03-02 DIAGNOSIS — I421 Obstructive hypertrophic cardiomyopathy: Secondary | ICD-10-CM

## 2020-03-02 DIAGNOSIS — I422 Other hypertrophic cardiomyopathy: Secondary | ICD-10-CM

## 2020-03-02 HISTORY — DX: Other hypertrophic cardiomyopathy: I42.2

## 2020-03-02 LAB — COMPREHENSIVE METABOLIC PANEL
ALT: 107 U/L — ABNORMAL HIGH (ref 0–44)
AST: 228 U/L — ABNORMAL HIGH (ref 15–41)
Albumin: 2.2 g/dL — ABNORMAL LOW (ref 3.5–5.0)
Alkaline Phosphatase: 127 U/L — ABNORMAL HIGH (ref 38–126)
Anion gap: 13 (ref 5–15)
BUN: 8 mg/dL (ref 6–20)
CO2: 23 mmol/L (ref 22–32)
Calcium: 8.8 mg/dL — ABNORMAL LOW (ref 8.9–10.3)
Chloride: 105 mmol/L (ref 98–111)
Creatinine, Ser: 0.74 mg/dL (ref 0.44–1.00)
GFR calc Af Amer: >60 mL/min (ref >60)
GFR calc non Af Amer: >60 mL/min (ref >60)
Glucose, Bld: 90 mg/dL (ref 70–99)
Potassium: 3.7 mmol/L (ref 3.5–5.1)
Sodium: 141 mmol/L (ref 135–145)
Total Bilirubin: 0.8 mg/dL (ref 0.3–1.2)
Total Protein: 5.8 g/dL — ABNORMAL LOW (ref 6.5–8.1)

## 2020-03-02 LAB — URINALYSIS, ROUTINE W REFLEX MICROSCOPIC
Bacteria, UA: NONE SEEN
Bilirubin Urine: NEGATIVE
Glucose, UA: NEGATIVE mg/dL
Ketones, ur: 5 mg/dL — AB
Leukocytes,Ua: NEGATIVE
Nitrite: NEGATIVE
Protein, ur: 100 mg/dL — AB
Specific Gravity, Urine: 1.017 (ref 1.005–1.030)
pH: 6 (ref 5.0–8.0)

## 2020-03-02 LAB — CULTURE, BLOOD (ROUTINE X 2)
Culture: NO GROWTH
Culture: NO GROWTH

## 2020-03-02 LAB — CBC
HCT: 27.4 % — ABNORMAL LOW (ref 36.0–46.0)
Hemoglobin: 8.4 g/dL — ABNORMAL LOW (ref 12.0–15.0)
MCH: 20.7 pg — ABNORMAL LOW (ref 26.0–34.0)
MCHC: 30.7 g/dL (ref 30.0–36.0)
MCV: 67.5 fL — ABNORMAL LOW (ref 80.0–100.0)
Platelets: 310 10*3/uL (ref 150–400)
RBC: 4.06 MIL/uL (ref 3.87–5.11)
RDW: 15.9 % — ABNORMAL HIGH (ref 11.5–15.5)
WBC: 7.1 10*3/uL (ref 4.0–10.5)
nRBC: 0 % (ref 0.0–0.2)

## 2020-03-02 LAB — PROTIME-INR
INR: 1.2 (ref 0.8–1.2)
Prothrombin Time: 15.1 seconds (ref 11.4–15.2)

## 2020-03-02 LAB — C3 COMPLEMENT: C3 Complement: 214 mg/dL — ABNORMAL HIGH (ref 82–167)

## 2020-03-02 LAB — HAPTOGLOBIN: Haptoglobin: 331 mg/dL (ref 33–346)

## 2020-03-02 LAB — INTERLEUKIN-6, SERUM  IL6SL: Interleukin-6, Serum: 55.5 pg/mL — ABNORMAL HIGH (ref 0.0–13.0)

## 2020-03-02 LAB — PROTEIN / CREATININE RATIO, URINE
Creatinine, Urine: 158.27 mg/dL
Protein Creatinine Ratio: 0.86 mg/mg{Cre} — ABNORMAL HIGH (ref 0.00–0.15)
Total Protein, Urine: 136 mg/dL

## 2020-03-02 LAB — C4 COMPLEMENT: Complement C4, Body Fluid: 65 mg/dL — ABNORMAL HIGH (ref 12–38)

## 2020-03-02 MED ORDER — DILTIAZEM HCL 60 MG PO TABS
30.0000 mg | ORAL_TABLET | Freq: Four times a day (QID) | ORAL | Status: DC
  Administered 2020-03-02 – 2020-03-03 (×4): 30 mg via ORAL
  Filled 2020-03-02 (×4): qty 1

## 2020-03-02 MED ORDER — SODIUM CHLORIDE 0.9 % IV SOLN: INTRAVENOUS | Status: DC

## 2020-03-02 NOTE — Consult Note (Signed)
Reason for Consult:Hypertrophic obstructive mid cavity cardiomyopathy Referring Physician:teaching service  Andrea Berry is an 54 y.o. female.  QRF:Andrea Berry is 54 year old female with past medical history significant for multiple medical problems, I.e.  Hypertension presently on no blood pressure medication, hypertrophic obstructive cardiomyopathy, SLE,status post recent cold with 19, pneumonia, interstitial lung disease, history of pulmonary embolism in the past, history of CVA, history of transverse myelitis, blindness, chronic central neuropathic pain,was admitted because of progressive worsening shortness of breath and was found to have covid 19 pneumonia and was discharged home after 5 days, requiring readmission because of generalized fatigue and progressive increasing shortness of breath.  Cardiologic consultation is called.  Aspiration was noted to have hypertrophic mid cavitary Obstructive cardiomyopathy.patient presently denies any chest pain.  Denies any palpitations, lightheadedness or syncope.  Denies any family history of sudden cardiac death.  States sister had questionable heart problems but not sure.  Patient had 2-D echo in 2017 which also showed made cabinetry.  Hypertrophic cardiomyopathy with peak gradient of 39 mmHg repeat echo done this admission showed peak gradient of 64 mmHg although patient has been tachycardic and not on any AV block, blocking medications.  States overall feels better since admission. Past Medical History:  Diagnosis Date  . Anemia of chronic disease 10/28/2015  . Anxiety 07/12/2018  . Arthritis   . Ataxia 01/03/2016  . Blind   . Chronic central neuropathic pain 10/02/2018   [facility]/2017  . Chronic coughing 09/10/2016  . Chronic pain syndrome 07/12/2018  . Decreased strength 10/08/2018   Proximal leg muscles  . Depression   . Depression with anxiety 01/03/2016  . Devic's disease ([place])   . Dysesthesia 01/03/2016  . Dyspnea on exertion  11/07/2018  . Eustachian tube dysfunction 11/18/2018   L>R. Followed by [facility].  . Eustachian tube dysfunction, bilateral 09/18/2018  . GERD (gastroesophageal reflux disease)   . Headache(784.0)   . History of chicken pox 10/08/2018  . History of CVA (cerebrovascular accident)   . History of kidney stones   . Hypertension   . Hyponatremia 11/25/2015  . Impairment of balance 10/08/2018  . Legal blindness 01/30/2014  . Long-term corticosteroid use 10/08/2018  . Lupus ([place])   . Lupus anticoagulant disorder ([place]) 10/02/2018   Pulmonary Embolism 2017  . Lupus cerebritis ([place]) 11/25/2015   Possible explanation of 11/25/2015 acute hyperintensity findings on Brain MRI.   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 Orthostatic dizziness, Recurrent 10/08/2018   Associated with medications  . Otalgia 07/12/2018  . Personal history of immunosupression therapy 10/08/2018   Chronic Azathioprine and prednisone therapy for Devic's disease  . Pigmented skin lesion of uncertain nature 4/98/2641  . Shortness of breath    due to medication  . Skin lesion of chest wall 11/07/2018  . SLE (systemic lupus erythematosus related syndrome) ([place]) 11/07/2018  . Smith antibody positive 04/07/2019  . Stroke [facility]) 2008   visually impaired  . Vitamin D deficiency 10/06/2018    Past Surgical History:  Procedure Laterality Date  . BREAST BIOPSY    . BREAST CYST EXCISION Left pt unsure  . COLONOSCOPY WITH PROPOFOL N/A 07/26/2017   Procedure: COLONOSCOPY WITH PROPOFOL;  Surgeon: Carol Ada, MD;  Location: [facility];  Service: Endoscopy;  Laterality: N/A;  . ESOPHAGOGASTRODUODENOSCOPY (EGD) WITH PROPOFOL N/A 07/26/2017   Procedure: ESOPHAGOGASTRODUODENOSCOPY (EGD) WITH PROPOFOL;  Surgeon: Carol Ada, MD;  Location: [facility];  Service: Endoscopy;  Laterality: N/A;  . PORTA CATH INSERTION    .  TUBAL LIGATION    . TUBAL LIGATION      Family History  Problem Relation Age of Onset  . Cancer Mother    . Cancer Father   . Lupus Sister     Social History:  reports that she has never smoked. She has never used smokeless tobacco. She reports that she does not drink alcohol and does not use drugs.  Allergies: No Known Allergies  Medications: I have reviewed the patient's current medications.  Results for orders placed or performed during the hospital encounter of 02/21/20 (from the past 48 hour(s))  C-reactive protein     Status: Abnormal   Collection Time: 02/29/20  2:52 PM  Result Value Ref Range   CRP 24.8 (H) <1.0 mg/dL    Comment: Performed at [facility], [place] [address], [place], [place] [address]  Ferritin     Status: Abnormal   Collection Time: 02/29/20  2:52 PM  Result Value Ref Range   Ferritin 2,812 (H) 11 - 307 ng/mL    Comment: Performed at [facility], [place] [address] [place], [place] [address]  Interleukin-6, Serum     Status: Abnormal   Collection Time: 02/29/20  2:52 PM  Result Value Ref Range   Interleukin-6, Serum 55.5 (H) 0.0 - 13.0 pg/mL    Comment: (NOTE) This test has not been FDA cleared or approved. This test has been authorized by FDA under an EUA for use by authorized laboratories. This test has been authorized only to assist in identifying severe inflammatory response, when used as an aid in determining the risk of intubation with mechanical ventilation in confirmed COVID-19 patients. This test is only authorized for the duration of the declaration that circumstances exist justifying the authorization of emergency use of medical devices under Section 564(b)(1) of the Act, 21 U.S.C. / 360bbb-3(b)(1), unless the authorization is terminated or revoked sooner. Based on the available clinical data, PCR-confirmed COVID-19 patients with IL-6 concentrations >35.0 pg/mL at presentation are at risk for mechanical ventilation during their hospitalization. IL-6 values should be used in conjunction with clinical findings and the results of other  laboratory findings. IL-6 values alone are not indicative of the need for en dotracheal intubation or mechanical ventilation. Performed At: [facility] [place], [place] [id number] Rush Farmer MD [id number]   CBC with Differential/Platelet     Status: Abnormal   Collection Time: 03/01/20  9:20 AM  Result Value Ref Range   WBC 7.1 4.0 - 10.5 K/uL   RBC 4.08 3.87 - 5.11 MIL/uL   Hemoglobin 8.6 (L) 12.0 - 15.0 g/dL    Comment: Reticulocyte Hemoglobin testing may be clinically indicated, consider ordering this additional test [id number]    HCT 27.3 (L) 36 - 46 %   MCV 66.9 (L) 80.0 - 100.0 fL   MCH 21.1 (L) 26.0 - 34.0 pg   MCHC 31.5 30.0 - 36.0 g/dL   RDW 15.9 (H) 11.5 - 15.5 %   Platelets 318 150 - 400 K/uL    Comment: REPEATED TO VERIFY   nRBC 0.0 0.0 - 0.2 %   Neutrophils Relative % 84 %   Neutro Abs 6.0 1.7 - 7.7 K/uL   Lymphocytes Relative 12 %   Lymphs Abs 0.9 0.7 - 4.0 K/uL   Monocytes Relative 4 %   Monocytes Absolute 0.3 0 - 1 K/uL   Eosinophils Relative 0 %   Eosinophils Absolute 0.0 0 - 0 K/uL   Basophils Relative 0 %  Basophils Absolute 0.0 0 - 0 K/uL   WBC Morphology See Note     Comment: Toxic Granulation   nRBC 0 0 /100 WBC   Abs Immature Granulocytes 0.00 0.00 - 0.07 K/uL    Comment: Performed at [facility] [address], [place], [place] [address]  Comprehensive metabolic panel     Status: Abnormal   Collection Time: 03/01/20  9:20 AM  Result Value Ref Range   Sodium 137 135 - 145 mmol/L   Potassium 3.6 3.5 - 5.1 mmol/L   Chloride 104 98 - 111 mmol/L   CO2 23 22 - 32 mmol/L   Glucose, Bld 110 (H) 70 - 99 mg/dL    Comment: Glucose reference range applies only to samples taken after fasting for at least 8 hours.   BUN 7 6 - 20 mg/dL   Creatinine, Ser 0.74 0.44 - 1.00 mg/dL   Calcium 8.8 (L) 8.9 - 10.3 mg/dL   Total Protein 5.8 (L) 6.5 - 8.1 g/dL   Albumin 2.1 (L) 3.5 - 5.0 g/dL   AST 270 (H) 15 - 41 U/L   ALT  113 (H) 0 - 44 U/L   Alkaline Phosphatase 138 (H) 38 - 126 U/L   Total Bilirubin 0.9 0.3 - 1.2 mg/dL   GFR calc non Af Amer >60 >60 mL/min   GFR calc Af Amer >60 >60 mL/min   Anion gap 10 5 - 15    Comment: Performed at [facility], [place] [address], [place] , [place] [address]  Hepatic function panel     Status: Abnormal   Collection Time: 03/01/20 11:55 AM  Result Value Ref Range   Total Protein 6.0 (L) 6.5 - 8.1 g/dL   Albumin 2.2 (L) 3.5 - 5.0 g/dL   AST 273 (H) 15 - 41 U/L   ALT 111 (H) 0 - 44 U/L   Alkaline Phosphatase 133 (H) 38 - 126 U/L   Total Bilirubin 0.9 0.3 - 1.2 mg/dL   Bilirubin, Direct 0.3 (H) 0.0 - 0.2 mg/dL   Indirect Bilirubin 0.6 0.3 - 0.9 mg/dL    Comment: Performed at [facility] [address] [place], [place] [address]  Hepatitis panel, acute     Status: None   Collection Time: 03/01/20 11:55 AM  Result Value Ref Range   Hepatitis B Surface Ag NON REACTIVE NON REACTIVE   HCV Ab NON REACTIVE NON REACTIVE    Comment: (NOTE) Nonreactive HCV antibody screen is consistent with no HCV infections,  unless recent infection is suspected or other evidence exists to indicate HCV infection.     Hep A IgM NON REACTIVE NON REACTIVE   Hep B C IgM NON REACTIVE NON REACTIVE    Comment: Performed at [facility], [place] [address] [place], [place] [address]  Troponin I (High Sensitivity)     Status: Abnormal   Collection Time: 03/01/20  2:44 PM  Result Value Ref Range   Troponin I (High Sensitivity) 25 (H) <18 ng/L    Comment: (NOTE) Elevated high sensitivity troponin I (hsTnI) values and significant  changes across serial measurements may suggest ACS but many other  chronic and acute conditions are known to elevate hsTnI results.  Refer to the "Links" section for chest pain algorithms and additional  guidance. Performed at [facility], [place] [address], [place], [place] [address]   Lactate dehydrogenase     Status: Abnormal   Collection  Time: 03/01/20  2:44 PM  Result Value Ref Range   LDH 751 (H) 98 - 192 U/L    Comment: Performed at [facility], [place] [address] [place], [place] [address]  Sedimentation rate     Status: Abnormal   Collection Time: 03/01/20  2:44 PM  Result Value Ref Range   Sed Rate 96 (H) 0 - 22 mm/hr    Comment: Performed at [facility] [address], [place], [place] [address]  Haptoglobin     Status: None   Collection Time: 03/01/20  2:44 PM  Result Value Ref Range   Haptoglobin 331 33 - 346 mg/dL    Comment: (NOTE) Performed At: [facility] [place], [place] [id number] Rush Farmer MD [id number]   Save Smear     Status: None   Collection Time: 03/01/20  2:44 PM  Result Value Ref Range   Smear Review SMEAR STAINED AND AVAILABLE FOR REVIEW     Comment: Performed at [facility], [place] [address] [place], [place] [address]  C3 complement     Status: Abnormal   Collection Time: 03/01/20  2:44 PM  Result Value Ref Range   C3 Complement 214 (H) 82 - 167 mg/dL    Comment: (NOTE) Performed At: [facility] [place], [place] [id number] Rush Farmer MD [id number]   C4 complement     Status: Abnormal   Collection Time: 03/01/20  2:44 PM  Result Value Ref Range   Complement C4, Body Fluid 65 (H) 12 - 38 mg/dL    Comment: (NOTE) Performed At: [facility] [place], [place] [id number] Rush Farmer MD [id number]   CBC     Status: Abnormal   Collection Time: 03/02/20  2:22 AM  Result Value Ref Range   WBC 7.1 4.0 - 10.5 K/uL   RBC 4.06 3.87 - 5.11 MIL/uL   Hemoglobin 8.4 (L) 12.0 - 15.0 g/dL    Comment: Reticulocyte Hemoglobin testing may be clinically indicated, consider ordering this additional test [id number]    HCT 27.4 (L) 36 - 46 %   MCV 67.5 (L) 80.0 - 100.0 fL   MCH 20.7 (L) 26.0 - 34.0 pg   MCHC 30.7 30.0 - 36.0 g/dL   RDW 15.9 (H) 11.5 - 15.5 %   Platelets 310 150 - 400 K/uL    nRBC 0.0 0.0 - 0.2 %    Comment: Performed at [facility], [place] [address] [place], [place] [address]  Protime-INR     Status: None   Collection Time: 03/02/20  2:22 AM  Result Value Ref Range   Prothrombin Time 15.1 11.4 - 15.2 seconds   INR 1.2 0.8 - 1.2    Comment: (NOTE) INR goal varies based on device and disease states. Performed at [facility], [place] [address], [place], [place] [address]   Comprehensive metabolic panel     Status: Abnormal   Collection Time: 03/02/20  2:22 AM  Result Value Ref Range   Sodium 141 135 - 145 mmol/L   Potassium 3.7 3.5 - 5.1 mmol/L   Chloride 105 98 - 111 mmol/L   CO2 23 22 - 32 mmol/L   Glucose, Bld 90 70 - 99 mg/dL    Comment: Glucose reference range applies only to samples taken after fasting for at least 8 hours.   BUN 8 6 - 20 mg/dL   Creatinine, Ser 0.74 0.44 - 1.00 mg/dL   Calcium  8.8 (L) 8.9 - 10.3 mg/dL   Total Protein 5.8 (L) 6.5 - 8.1 g/dL   Albumin 2.2 (L) 3.5 - 5.0 g/dL   AST 228 (H) 15 - 41 U/L   ALT 107 (H) 0 - 44 U/L   Alkaline Phosphatase 127 (H) 38 - 126 U/L   Total Bilirubin 0.8 0.3 - 1.2 mg/dL   GFR calc non Af Amer >60 >60 mL/min   GFR calc Af Amer >60 >60 mL/min   Anion gap 13 5 - 15    Comment: Performed at [facility], [place] [name], [place], [place] [address]  Urinalysis, Routine w reflex microscopic     Status: Abnormal   Collection Time: 03/02/20  5:13 AM  Result Value Ref Range   Color, Urine YELLOW YELLOW   APPearance HAZY (A) CLEAR   Specific Gravity, Urine 1.017 1.005 - 1.030   pH 6.0 5.0 - 8.0   Glucose, UA NEGATIVE NEGATIVE mg/dL   Hgb urine dipstick SMALL (A) NEGATIVE   Bilirubin Urine NEGATIVE NEGATIVE   Ketones, ur 5 (A) NEGATIVE mg/dL   Protein, ur 100 (A) NEGATIVE mg/dL   Nitrite NEGATIVE NEGATIVE   Leukocytes,Ua NEGATIVE NEGATIVE   RBC / HPF 6-10 0 - 5 RBC/hpf   WBC, UA 0-5 0 - 5 WBC/hpf   Bacteria, UA NONE SEEN NONE SEEN   Squamous Epithelial / LPF 6-10 0 - 5    Mucus PRESENT    Hyaline Casts, UA PRESENT     Comment: Performed at [facility], [place] [address] [place], [place] [address]  Protein / creatinine ratio, urine     Status: Abnormal   Collection Time: 03/02/20  5:13 AM  Result Value Ref Range   Creatinine, Urine 158.27 mg/dL   Total Protein, Urine 136 mg/dL    Comment: NO NORMAL RANGE ESTABLISHED FOR THIS TEST   Protein Creatinine Ratio 0.86 (H) 0.00 - 0.15 mg/mg[Cre]    Comment: Performed at [facility] [address], [place], [place] [address]    DG Chest 2 View  Result Date: 02/29/2020 CLINICAL DATA:  Shortness of breath and weakness EXAM: CHEST - 2 VIEW COMPARISON:  02/24/2020, 02/21/2020, 02/04/2020, CT 02/25/2020 FINDINGS: Small bilateral pleural effusions. Enlarged cardiomediastinal silhouette with central vascular congestion. Diffuse increased interstitial and ground-glass opacity with patchy bilateral consolidations. No pneumothorax. IMPRESSION: 1. Slight increased ground-glass opacity and consolidation in the right upper lobe though with some clearing of infiltrate from right base. Remaining bilateral pneumonia otherwise unchanged. 2. There is cardiomegaly, vascular congestion and small pleural effusions. There may be an underlying component of mild interstitial edema. Electronically Signed   By: Donavan Foil M.D.   On: 02/29/2020 16:22   ECHOCARDIOGRAM COMPLETE  Result Date: 03/01/2020    ECHOCARDIOGRAM REPORT   Patient Name:   Andrea Berry Date of Exam: 03/01/2020 Medical Rec #:  [id number]     Height:       61.0 in Accession #:    [id number]    Weight:       194.7 lb Date of Birth:  07/14/1966     BSA:          1.867 m Patient Age:    45 years      BP:           105/60 mmHg Patient Gender: F             HR:  111 bpm. Exam Location:  Inpatient Procedure: 2D Echo, Cardiac Doppler and Color Doppler Indications:    Abnormal ECG  History:        Patient has prior history of Echocardiogram examinations, most                  recent 10/17/2015. H/o pulmonary embolus. COVID-19.  Sonographer:    Clayton Lefort RDCS (AE) Referring Phys: [id number] CARINA M BROWN  Sonographer Comments: Patient is morbidly obese. Image acquisition challenging due to patient body habitus. IMPRESSIONS  1. Left ventricular ejection fraction, by estimation, is 70 to 75%. The left ventricle has hyperdynamic function. The left ventricle has no regional wall motion abnormalities. There is moderate concentric left ventricular hypertrophy and severe basal septal hypertrophy. There is a mid intracavitary gradient due to hyperdynamic LVF with peak resting gradient 72mmHg.  2. Right ventricular systolic function is normal. The right ventricular size is normal. Tricuspid regurgitation signal is inadequate for assessing PA pressure.  3. The mitral valve is normal in structure. No evidence of mitral valve regurgitation. No evidence of mitral stenosis.  4. The aortic valve is normal in structure. Aortic valve regurgitation is not visualized. No aortic stenosis is present.  5. The inferior vena cava is normal in size with greater than 50% respiratory variability, suggesting right atrial pressure of 3 mmHg.  6. Findings consistent with possible HOCM. No evidence of SAM. FINDINGS  Left Ventricle: Left ventricular ejection fraction, by estimation, is 70 to 75%. The left ventricle has hyperdynamic function. The left ventricle has no regional wall motion abnormalities. The left ventricular internal cavity size was normal in size. There is moderate concentric left ventricular hypertrophy. Left ventricular diastolic parameters are consistent with Grade I diastolic dysfunction (impaired relaxation). Normal left ventricular filling pressure. Right Ventricle: The right ventricular size is normal. No increase in right ventricular wall thickness. Right ventricular systolic function is normal. Tricuspid regurgitation signal is inadequate for assessing PA pressure. Left Atrium: Left atrial  size was normal in size. Right Atrium: Right atrial size was not well visualized. Pericardium: There is no evidence of pericardial effusion. Mitral Valve: The mitral valve is normal in structure. Normal mobility of the mitral valve leaflets. No evidence of mitral valve regurgitation. No evidence of mitral valve stenosis. Tricuspid Valve: The tricuspid valve is normal in structure. Tricuspid valve regurgitation is not demonstrated. No evidence of tricuspid stenosis. Aortic Valve: The aortic valve is normal in structure. Aortic valve regurgitation is not visualized. No aortic stenosis is present. Pulmonic Valve: The pulmonic valve was normal in structure. Pulmonic valve regurgitation is not visualized. No evidence of pulmonic stenosis. Aorta: The aortic root is normal in size and structure. Venous: The inferior vena cava is normal in size with greater than 50% respiratory variability, suggesting right atrial pressure of 3 mmHg. IAS/Shunts: No atrial level shunt detected by color flow Doppler.  LEFT VENTRICLE PLAX 2D LVIDd:         3.10 cm  Diastology LVIDs:         1.90 cm  LV e' lateral:   4.57 cm/s LV PW:         1.50 cm  LV E/e' lateral: 12.5 LV IVS:        2.10 cm  LV e' medial:    5.00 cm/s LVOT diam:     1.80 cm  LV E/e' medial:  11.4 LVOT Area:     2.54 cm  LEFT ATRIUM  Index LA diam:        3.40 cm 1.82 cm/m LA Vol (A2C):   43.4 ml 23.24 ml/m LA Vol (A4C):   36.3 ml 19.44 ml/m LA Biplane Vol: 41.0 ml 21.96 ml/m   AORTA Ao Root diam: 3.00 cm Ao Asc diam:  2.80 cm MITRAL VALVE MV Area (PHT): 4.17 cm    SHUNTS MV Decel Time: 182 msec    Systemic Diam: 1.80 cm MV E velocity: 57.20 cm/s MV A velocity: 69.10 cm/s MV E/A ratio:  0.83 Fransico Him MD Electronically signed by Fransico Him MD Signature Date/Time: 03/01/2020/1:32:53 PM    Final    US Abdomen Limited RUQ  Result Date: 03/01/2020 CLINICAL DATA:  54 year old female with elevated LFTs. EXAM: ULTRASOUND ABDOMEN LIMITED RIGHT UPPER QUADRANT  COMPARISON:  Right upper quadrant ultrasound dated 08/29/2012. FINDINGS: Gallbladder: There is a 15 mm stone in the gallbladder. No gallbladder wall thickening or pericholecystic fluid. Reported positive sonographic Murphy's sign. Common bile duct: Diameter: 7 mm Liver: No focal lesion identified. Within normal limits in parenchymal echogenicity. Portal vein is patent on color Doppler imaging with normal direction of blood flow towards the liver. Other: None. IMPRESSION: Cholelithiasis with equivocal sonographic findings for acute cholecystitis. A hepatobiliary scintigraphy may provide better evaluation of the gallbladder if there is a high clinical concern for acute cholecystitis . Electronically Signed   By: Anner Crete M.D.   On: 03/01/2020 20:06    Review of Systems  Constitutional: Positive for fatigue and fever.  HENT: Negative for sore throat.   Respiratory: Positive for shortness of breath.   Cardiovascular: Negative for chest pain, palpitations and leg swelling.  Gastrointestinal: Negative for abdominal distention.  Genitourinary: Negative for flank pain.  Hematological: Negative for adenopathy.   Blood pressure 130/78, pulse (!) 117, temperature 99.4 F (37.4 C), temperature source Oral, resp. rate (!) 34, height 5\' 1"  (1.549 m), weight 88.3 kg, SpO2 93 %. Physical Exam  HENT:  Head: Normocephalic and atraumatic.  Eyes: Pupils are equal, round, and reactive to light.  Cardiovascular: Regular rhythm. Tachycardia present. Exam reveals no gallop and no friction rub.  No murmur heard. Respiratory: She has rhonchi (bilateral rhonchi noted).  GI: Soft. She exhibits no distension. There is no abdominal tenderness.  Musculoskeletal:        General: No swelling or tenderness.     Right lower leg: No edema.     Left lower leg: No edema.    Assessment/Plan: Hypertensive heart disease with diastolic dysfunction and mid cavitary Hypertrophic cardiomyopathy with no evidence of systolic  anterior motion of mitral valve or significant mitral regurgitation Status post code 19.  Pneumonia. Interstitial lung disease. SLE History of PE. Transverse myelitis. History of CVA. Blindness. Central neuropathic pain Plan Start Cardizem 30 mg by mouth every 6 hours. Avoid beta agonist. IV fluid normal saline 50 cc/h We'll consider rechecking the gradient as outpatient once he recovers from her present illness and on AV blocking medications. Will up titrate calcium channel blockers as blood pressure tolerates  Charolette Forward 03/02/2020, 10:43 AM

## 2020-03-02 NOTE — Progress Notes (Signed)
Family Medicine Teaching Service Daily Progress Note Intern Pager: [phone]  Patient name: Andrea Berry Medical record number: [id number] Date of birth: December 22, 1965 Age: 54 y.o. Gender: female  Primary Care Provider: McDiarmid, Blane Ohara, MD Consultants: None Code Status: Full  Pt Overview and Major Events to Date:  6/13 - admitted 6/18- ID consulted, transitioned back to Ceftriaxone for H. Influenzae pneumonia   Assessment and Plan: Andrea Berry is a 54 y.o. female presenting with acute on chronic respiratory failure likely due to superimposed H. Influenzae pneumoniae in setting of prolonged COVID19. PMH is significant for legal blindness, SLE, chronic interstitial lung disease,hx of PE on Xarelto, chronic central neuropathic pain, mild cognitive impairment, mood disorder, neuromyelitis optica, steroid-induced osteopenia, migraine without aura.  Acute hypoxic respiratory failure in the setting of previous Haemophilus influenza pneumonia and prolonged COVID-19 Diagnosed with COVID-19 on 5/27.   Chest CT showed multifocal bilateral pneumonia with no significant change since previous x-ray, trace bilateral pleural effusions with minimal dependent lower lobe atelectasis. Infectious disease was consulted who feels this is most likely due to H influenza superimposed upon COVID-19 infection with persistent inflammatory response.  Recommend ceftriaxone alone and continue isolation.  Patient continued with some fevers overnight to 103.1.  Currently on 4 L nasal cannula satting 94%. CXR repeat shows slight increased ground-glass opacity and consolidation in the right upper lobe though with some clearing of infiltrate from right base. Remaining bilateral pneumonia otherwise unchanged.  Spoke with [facility] rheumatology yesterday who recommended a number of labs to monitor for possible causes of this patient's ongoing increased oxygen needs and fevers.  Ferritin 942> 2812.  CRP elevated at 24.8.  WBC7.1.   Echo performed yesterday showed ejection fraction of 78-75% with no regional wall abnormalities of the left ventricle but possible HOCM.  Patient did have a mildly elevated troponin of 25 which was likely in the setting of demand ischemia.  Patient's inflammatory markers continue to rise with LDH 751, ferritin 2812, CRP 24.8.  Urinalysis showed small hemoglobin, 5 ketones, negative leuk, negative nitrite, protein elevated at 100.   -ID consult, appreciate recommendations. -Cardiology consult, appreciate recommendations -Discontinue ceftriaxone and observe off antibiotics per infectious disease  Persistent fevers likely due to above, other causes considered include SLE flare (negative markers with normal C3/4), serotonin syndrome. No new medications, has been on pregabalin and SNRI for >2 years. Will monitor and treat only symptomatic fevers.  Per discussion with [facility] rheumatology will check peripheral smear.  Patient's last fever was 0600 on 6/22, prior to this the patient had been favoring once or twice per day in the setting of ceftriaxone use.  Patient did not have any rashes but this may be related to drug interaction. -Monitor for fevers, treat fevers if symptomatic -We will hold acetaminophen and NSAIDs in the presence of elevated LFTs, can consider small dose of ibuprofen if needed for symptomatic fevers.  LFT elevated: Alk phos 138>127, AST 270>228, ALT 113>107.  PT and INR within normal limits but at the upper range of  15.1, 1.2 respectively. Right upper quadrant abdominal ultrasound with cholelithiasis with equivocal sonographic findings for acute cholecystitis.  Patient nonreactive to hep C, hep B surface antigen and core antibody, hep A.  No abdominal pain on physical exam. -We will hold Tylenol and NSAIDs, can consider small dose of ibuprofen if needed for symptomatic fevers.  Constipation Patient states even with MiraLAX she has been unable to have a bowel movement.  She states  she does  not think she has had one since this hospitalization.  Discussed with patient about attempting soapsuds enema which patient agreed to.  Patient was able to have a bowel movement 6/20 after the soapsuds enema. -Continue MiraLAX scheduled  Anemia Patient CBC had a hemoglobin 8.4 today from 10.5 on 6/13.  MCV low at 67.5.  Hemoglobin 6/18 was 9.2.  Patient not endorsing any sources of bleeding.  Iron panel shows iron reduced at 6, saturation ratio reduced at 3, ferritin increased at 942 suggestive of anemia of chronic disease.  Patient's ferritin can also be increased as an acute phase reactant in the setting of inflammation however with the patient's history of lupus anemia of chronic disease seems most consistent. -Recheck C3, C4, double-stranded DNA per discussion with rheumatology -We will check haptoglobin -Peripheral smear as above  AKI -  resolved Creatinine on arrival 1.27, currently 0.74.  Creatinine at discharge on 6/1 was 0.87.  Most likely perfusion related. -Monitor vital signs -Avoid nephrotoxic agents  Neuromyelitis optica Patient is legally blind Currently following with ophthalmology.  No acute changes in vision -Continue home ophthalmic solution  SLE Follows with [facility] rheumatology.  She was on hydroxychloroquine but is not taking it per her ophthalmologist request.  Home medication includes prednisone 5 mg daily and she receives rituximab infusions every 6 months.  While hospitalized for Covid 2 weeks ago she received a course of Decadron. -Restarted patient's home prednisone after discontinued Decadron  History of PE PE occurred in 2017.  Home medications include Xarelto 10 mg daily.  Patient negative for antiphospholipid syndrome -Continue home Xarelto  History of transverse myelitis, Devic's disease Patient pain developed in 2017.  Chronic left upper and lower extremity weakness.  Home medications include tramadol, Neurontin, Lyrica,  Trileptal. -Continue home schedule Trileptal -Reduce patient's Lyrica to 100 mg 3 times per day -On tramadol as needed  Chronic interstitial lung disease Patient sees pulmonology as well as asthma and allergy.  PFT on 11/24/2018 unremarkable.  Per chart review from previous admission the patient had PFT performed December 2020 which they were unable to find records for.  Home medication includes Flonase nasal spray.  Insomnia Home medication includes Ativan 0.5 mg nightly. -Melatonin as needed  Prednisone-induced osteoporosis Patient is on alendronate diet at home, is supposed to be on 2-year holiday given longstanding use  Mood disorder Home medication includes Cymbalta 60 mg daily -Continue home Cymbalta  Hypertension Most recent blood pressure 124/66.  Patient was previously recommended to take Norvasc by cardiology but it does not appear that she is on any antihypertensive at this time. -Continue to monitor  Aortic regurgitation Patient had evidence on TEE with no evidence of structural changes.  No treatment necessary per cardiology.  History of CVA Patient had CVA in 2008.  Home medications include Xarelto -Continue home Xarelto, no antiplatelets  FEN/GI: regular diet PPx: Xarelto  Disposition: Pending clinical improvement    Subjective:  Patient endorses some nausea but no vomiting.  She denies chest pains at this time and states she thinks her breathing is no better or no worse than yesterday.  Currently on 4 L nasal cannula  Objective: Temp:  [98.7 F (37.1 C)-99.6 F (37.6 C)] 98.8 F (37.1 C) (06/23 0400) Pulse Rate:  [83-119] 83 (06/23 0400) Resp:  [27-39] 39 (06/23 0400) BP: (105-124)/(60-80) 124/80 (06/23 0400) SpO2:  [91 %-97 %] 91 % (06/23 0400) Physical Exam: General: Appears unwell, alert and oriented and in no apparent distress Heart: Tachycardic rate with  regular rhythm and no murmurs appreciated Lungs: Crackles present bilaterally in the  bases, seems similar to yesterday with nasal cannula in place on 4 L. Abdomen: Bowel sounds present, no abdominal pain on palpation, negative Murphy sign  Laboratory: Recent Labs  Lab 02/29/20 0307 03/01/20 0920 03/02/20 0222  WBC 7.0 7.1 7.1  HGB 9.1* 8.6* 8.4*  HCT 28.5* 27.3* 27.4*  PLT 306 318 310   Recent Labs  Lab 02/28/20 0404 02/29/20 0307 03/01/20 0920 03/01/20 1155 03/02/20 0222  NA  --  136 137  --  141  K  --  3.6 3.6  --  3.7  CL  --  103 104  --  105  CO2  --  19* 23  --  23  BUN  --  7 7  --  8  CREATININE  --  0.84 0.74  --  0.74  CALCIUM  --  8.4* 8.8*  --  8.8*  PROT   < >  --  5.8* 6.0* 5.8*  BILITOT   < >  --  0.9 0.9 0.8  ALKPHOS   < >  --  138* 133* 127*  ALT   < >  --  113* 111* 107*  AST   < >  --  270* 273* 228*  GLUCOSE  --  102* 110*  --  90   < > = values in this interval not displayed.    Imaging/Diagnostic Tests: DG Chest 2 View  Result Date: 02/29/2020 CLINICAL DATA:  Shortness of breath and weakness EXAM: CHEST - 2 VIEW COMPARISON:  02/24/2020, 02/21/2020, 02/04/2020, CT 02/25/2020 FINDINGS: Small bilateral pleural effusions. Enlarged cardiomediastinal silhouette with central vascular congestion. Diffuse increased interstitial and ground-glass opacity with patchy bilateral consolidations. No pneumothorax. IMPRESSION: 1. Slight increased ground-glass opacity and consolidation in the right upper lobe though with some clearing of infiltrate from right base. Remaining bilateral pneumonia otherwise unchanged. 2. There is cardiomegaly, vascular congestion and small pleural effusions. There may be an underlying component of mild interstitial edema. Electronically Signed   By: Donavan Foil M.D.   On: 02/29/2020 16:22   CT Head Wo Contrast  Result Date: 02/04/2020 CLINICAL DATA:  Acute pain due to trauma. EXAM: CT HEAD WITHOUT CONTRAST TECHNIQUE: Contiguous axial images were obtained from the base of the skull through the vertex without intravenous  contrast. COMPARISON:  MRI dated November 25, 2015. CT head dated September 21, 2015. FINDINGS: Brain: No evidence of acute infarction, hemorrhage, hydrocephalus, extra-axial collection or mass lesion/mass effect. Vascular: No hyperdense vessel or unexpected calcification. Skull: Normal. Negative for fracture or focal lesion. Sinuses/Orbits: There is pansinus mucosal thickening. The mastoid air cells are essentially clear. Other: None. IMPRESSION: 1. No acute intracranial abnormality. 2. Pansinus mucosal thickening. Electronically Signed   By: Constance Holster M.D.   On: 02/04/2020 17:52   CT CHEST W CONTRAST  Result Date: 02/25/2020 CLINICAL DATA:  Pneumonia EXAM: CT CHEST WITH CONTRAST TECHNIQUE: Multidetector CT imaging of the chest was performed during intravenous contrast administration. CONTRAST:  143m OMNIPAQUE IOHEXOL 300 MG/ML  SOLN COMPARISON:  02/24/2020 FINDINGS: Cardiovascular: Heart is unremarkable without pericardial effusion. Normal caliber of the thoracic aorta. No significant atherosclerosis. Mediastinum/Nodes: No enlarged mediastinal, hilar, or axillary lymph nodes. Thyroid gland, trachea, and esophagus demonstrate no significant findings. Lungs/Pleura: There is multifocal bilateral airspace disease, most pronounced within the left upper lobe. Trace bilateral pleural effusions are noted. Minimal dependent lower lobe atelectasis. No pneumothorax. Central airways are patent. Upper Abdomen: No  acute abnormality. Musculoskeletal: No acute or destructive bony lesions. Reconstructed images demonstrate no additional findings. IMPRESSION: 1. Multifocal bilateral pneumonia, not significantly changed since previous chest x-ray. 2. Trace bilateral pleural effusions and minimal dependent lower lobe atelectasis. Electronically Signed   By: Randa Ngo M.D.   On: 02/25/2020 23:57   DG CHEST PORT 1 VIEW  Result Date: 02/24/2020 CLINICAL DATA:  Fever EXAM: PORTABLE CHEST 1 VIEW COMPARISON:  February 21, 2020  FINDINGS: There is airspace opacity throughout the right mid and lower lung regions as well as in the left mid lung and left base regions. There is atelectatic change in the medial right base. There is a probable small left pleural effusion. Heart is slightly enlarged with pulmonary vascularity normal. No adenopathy. No bone lesions. IMPRESSION: Multifocal pneumonia, slightly increased in the left mid lung and stable elsewhere. Probable small left pleural effusion. Medial right base atelectasis. Stable cardiac prominence.  No adenopathy evident. Electronically Signed   By: Lowella Grip III M.D.   On: 02/24/2020 10:20   DG Chest Port 1 View  Result Date: 02/21/2020 CLINICAL DATA:  Shortness of breath.  COVID positive. EXAM: PORTABLE CHEST 1 VIEW COMPARISON:  Feb 04, 2020 FINDINGS: There are hazy bilateral airspace opacities, greatest at the lung bases bilaterally. There is no pneumothorax. There are small bilateral pleural effusions, right greater than left. The cardiac silhouette appears enlarged but stable from prior study. There is no acute osseous abnormality. IMPRESSION: 1. Multifocal pneumonia (viral or bacterial). 2. Small bilateral pleural effusions, right greater than left. Electronically Signed   By: Constance Holster M.D.   On: 02/21/2020 19:09   DG Chest Port 1 View  Result Date: 02/04/2020 CLINICAL DATA:  COVID-19.  Cough.  Shortness of breath. EXAM: PORTABLE CHEST 1 VIEW COMPARISON:  11/06/2018 FINDINGS: The heart size and pulmonary vascularity are normal. Slight haziness at the left lung base laterally. Chronic scarring at the right lung base. No effusions. No bone abnormality of significance. IMPRESSION: Slight haziness at the left lung base laterally which could represent a small area of infiltrate. Scarring at the right lung base. Electronically Signed   By: Lorriane Shire M.D.   On: 02/04/2020 14:48   ECHOCARDIOGRAM COMPLETE  Result Date: 03/01/2020    ECHOCARDIOGRAM REPORT    Patient Name:   RHEAGAN NAYAK Date of Exam: 03/01/2020 Medical Rec #:  [id number]     Height:       61.0 in Accession #:    [id number]    Weight:       194.7 lb Date of Birth:  08/15/1966     BSA:          1.867 m Patient Age:    35 years      BP:           105/60 mmHg Patient Gender: F             HR:           111 bpm. Exam Location:  Inpatient Procedure: 2D Echo, Cardiac Doppler and Color Doppler Indications:    Abnormal ECG  History:        Patient has prior history of Echocardiogram examinations, most                 recent 10/17/2015. H/o pulmonary embolus. COVID-19.  Sonographer:    Clayton Lefort RDCS (AE) Referring Phys: [id number] CARINA M BROWN  Sonographer Comments: Patient is morbidly obese. Image acquisition challenging due to patient body  habitus. IMPRESSIONS  1. Left ventricular ejection fraction, by estimation, is 70 to 75%. The left ventricle has hyperdynamic function. The left ventricle has no regional wall motion abnormalities. There is moderate concentric left ventricular hypertrophy and severe basal septal hypertrophy. There is a mid intracavitary gradient due to hyperdynamic LVF with peak resting gradient 34mHg.  2. Right ventricular systolic function is normal. The right ventricular size is normal. Tricuspid regurgitation signal is inadequate for assessing PA pressure.  3. The mitral valve is normal in structure. No evidence of mitral valve regurgitation. No evidence of mitral stenosis.  4. The aortic valve is normal in structure. Aortic valve regurgitation is not visualized. No aortic stenosis is present.  5. The inferior vena cava is normal in size with greater than 50% respiratory variability, suggesting right atrial pressure of 3 mmHg.  6. Findings consistent with possible HOCM. No evidence of SAM. FINDINGS  Left Ventricle: Left ventricular ejection fraction, by estimation, is 70 to 75%. The left ventricle has hyperdynamic function. The left ventricle has no regional wall motion abnormalities. The  left ventricular internal cavity size was normal in size. There is moderate concentric left ventricular hypertrophy. Left ventricular diastolic parameters are consistent with Grade I diastolic dysfunction (impaired relaxation). Normal left ventricular filling pressure. Right Ventricle: The right ventricular size is normal. No increase in right ventricular wall thickness. Right ventricular systolic function is normal. Tricuspid regurgitation signal is inadequate for assessing PA pressure. Left Atrium: Left atrial size was normal in size. Right Atrium: Right atrial size was not well visualized. Pericardium: There is no evidence of pericardial effusion. Mitral Valve: The mitral valve is normal in structure. Normal mobility of the mitral valve leaflets. No evidence of mitral valve regurgitation. No evidence of mitral valve stenosis. Tricuspid Valve: The tricuspid valve is normal in structure. Tricuspid valve regurgitation is not demonstrated. No evidence of tricuspid stenosis. Aortic Valve: The aortic valve is normal in structure. Aortic valve regurgitation is not visualized. No aortic stenosis is present. Pulmonic Valve: The pulmonic valve was normal in structure. Pulmonic valve regurgitation is not visualized. No evidence of pulmonic stenosis. Aorta: The aortic root is normal in size and structure. Venous: The inferior vena cava is normal in size with greater than 50% respiratory variability, suggesting right atrial pressure of 3 mmHg. IAS/Shunts: No atrial level shunt detected by color flow Doppler.  LEFT VENTRICLE PLAX 2D LVIDd:         3.10 cm  Diastology LVIDs:         1.90 cm  LV e' lateral:   4.57 cm/s LV PW:         1.50 cm  LV E/e' lateral: 12.5 LV IVS:        2.10 cm  LV e' medial:    5.00 cm/s LVOT diam:     1.80 cm  LV E/e' medial:  11.4 LVOT Area:     2.54 cm  LEFT ATRIUM             Index LA diam:        3.40 cm 1.82 cm/m LA Vol (A2C):   43.4 ml 23.24 ml/m LA Vol (A4C):   36.3 ml 19.44 ml/m LA Biplane  Vol: 41.0 ml 21.96 ml/m   AORTA Ao Root diam: 3.00 cm Ao Asc diam:  2.80 cm MITRAL VALVE MV Area (PHT): 4.17 cm    SHUNTS MV Decel Time: 182 msec    Systemic Diam: 1.80 cm MV E velocity: 57.20 cm/s MV A velocity:  69.10 cm/s MV E/A ratio:  0.83 Fransico Him MD Electronically signed by Fransico Him MD Signature Date/Time: 03/01/2020/1:32:53 PM    Final    US Abdomen Limited RUQ  Result Date: 03/01/2020 CLINICAL DATA:  54 year old female with elevated LFTs. EXAM: ULTRASOUND ABDOMEN LIMITED RIGHT UPPER QUADRANT COMPARISON:  Right upper quadrant ultrasound dated 08/29/2012. FINDINGS: Gallbladder: There is a 15 mm stone in the gallbladder. No gallbladder wall thickening or pericholecystic fluid. Reported positive sonographic Murphy's sign. Common bile duct: Diameter: 7 mm Liver: No focal lesion identified. Within normal limits in parenchymal echogenicity. Portal vein is patent on color Doppler imaging with normal direction of blood flow towards the liver. Other: None. IMPRESSION: Cholelithiasis with equivocal sonographic findings for acute cholecystitis. A hepatobiliary scintigraphy may provide better evaluation of the gallbladder if there is a high clinical concern for acute cholecystitis . Electronically Signed   By: Anner Crete M.D.   On: 03/01/2020 20:06    Lurline Del, DO 03/02/2020, 6:20 AM PGY-1, [place] Intern pager: [phone], text pages welcome

## 2020-03-02 NOTE — NC FL2 (Signed)
[name] LEVEL OF CARE SCREENING TOOL     IDENTIFICATION  Patient Name: Andrea Berry Birthdate: 06-22-1966 Sex: female Admission Date (Current Location): 02/21/2020  [facility] and [place] Number:  Herbalist and Address:  [facility] [place], [place] [address] [place],  [address]      Provider Number: [id number]  Attending Physician Name and Address:  Martyn Malay, MD  Relative Name and Phone Number:  [name], son, [phone]    Current Level of Care: Hospital Recommended Level of Care: [place] Prior Approval Number:    Date Approved/Denied:   PASRR Number: [id number]  Discharge Plan: SNF    Current Diagnoses: Patient Active Problem List   Diagnosis Date Noted   Hypertrophic cardiomyopathy ([place]) 03/02/2020   Microcytic anemia 02/26/2020   Fever 02/26/2020   Pneumonia 02/21/2020   History of COVID-19 02/16/2020   Physical deconditioning 02/16/2020   Acute respiratory failure with hypoxia ([place]) 02/16/2020   COVID-19 02/04/2020   Specific antibody deficiency 01/18/2020   ILD (interstitial lung disease) ([place]) 08/31/2019   Cardiomegaly 08/31/2019   High risk medication use 08/31/2019   Elevated BP without diagnosis of hypertension 08/28/2019   Constipation 08/04/2019   Lupus ([place]) 04/02/2019   Migraine without aura 03/27/2019   Steroid-induced osteopenia 03/23/2019   Encounter for screening for diseases of the blood and blood-forming organs and certain disorders involving the immune mechanism 01/07/2019   Chronic rhinitis 01/07/2019   Current chronic use of systemic steroids 01/07/2019   History of pulmonary embolism 12/10/2018   Eustachian tube dysfunction, left 11/13/2018   SLE (systemic lupus erythematosus related syndrome) ([place]) 11/07/2018   Pigmented skin lesion of uncertain nature 25/85/2778   Mild cognitive impairment 10/08/2018   Personal history of immunosupression  therapy 10/08/2018   Long-term corticosteroid use 10/08/2018   Chronic central neuropathic pain 10/02/2018   Lupus anticoagulant disorder ([place]) 10/02/2018   Anxiety 07/12/2018   Chronic pain syndrome 07/12/2018   Chronic coughing 09/10/2016   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[place]) 01/03/2016   Ataxia 01/03/2016   Dysesthesia 01/03/2016   Anemia of chronic disease 10/28/2015   Mood disorder ([place]) 09/21/2015   Legal blindness 01/30/2014    Orientation RESPIRATION BLADDER Height & Weight     Self, Time, Situation, Place  O2 (Nasal cannula 3L) Continent Weight: 194 lb 10.7 oz (88.3 kg) Height:  5\' 1"  (154.9 cm)  BEHAVIORAL SYMPTOMS/MOOD NEUROLOGICAL BOWEL NUTRITION STATUS      Continent Diet (Please see DC Summary)  AMBULATORY STATUS COMMUNICATION OF NEEDS Skin   Limited Assist Verbally Normal                       Personal Care Assistance Level of Assistance  Bathing, Feeding, Dressing Bathing Assistance: Limited assistance Feeding assistance: Limited assistance Dressing Assistance: Limited assistance     Functional Limitations Info  Sight, Hearing, Speech Sight Info: Impaired (Blind) Hearing Info: Adequate Speech Info: Adequate    SPECIAL CARE FACTORS FREQUENCY  PT (By licensed PT), OT (By licensed OT)     PT Frequency: 5x/week OT Frequency: 5x/week            Contractures Contractures Info: Not present    Additional Factors Info  Code Status, Allergies, Isolation Precautions, Psychotropic Code Status Info: Full Allergies Info: Beta Adrenergic Blockers Psychotropic Info: Cymbalta; Lyrica   Isolation Precautions Info: COVID+     Current Medications (03/02/2020):  This is the current hospital active medication list  Current Facility-Administered Medications  Medication Dose Route Frequency Provider Last Rate Last Admin   0.9 %  sodium chloride infusion   Intravenous Continuous Matilde Haymaker, MD 30 mL/hr at 03/02/20 1318 New Bag at 03/02/20 1318    diltiazem (CARDIZEM) tablet 30 mg  30 mg Oral QID Charolette Forward, MD   30 mg at 03/02/20 1312   DULoxetine (CYMBALTA) DR capsule 60 mg  60 mg Oral Daily Gifford Shave, MD   60 mg at 03/02/20 1002   famotidine (PEPCID) tablet 40 mg  40 mg Oral Daily PRN Gifford Shave, MD       fluticasone (FLONASE) 50 MCG/ACT nasal spray 1 spray  1 spray Each Nare Daily PRN Gifford Shave, MD       ipratropium (ATROVENT) 0.06 % nasal spray 2 spray  2 spray Each Nare Q12H Milus Banister C, DO   2 spray at 03/02/20 1007   ketotifen (ZADITOR) 0.025 % ophthalmic solution 1 drop  1 drop Both Eyes BID Gifford Shave, MD   1 drop at 03/02/20 1007   linaclotide (LINZESS) capsule 145 mcg  145 mcg Oral QAC breakfast Gifford Shave, MD   145 mcg at 03/02/20 1002   MEDLINE mouth rinse  15 mL Mouth Rinse BID Lind Covert, MD   15 mL at 03/02/20 1006   ondansetron (ZOFRAN) injection 4 mg  4 mg Intravenous Q8H PRN Meccariello, Bernita Raisin, DO   4 mg at 03/02/20 0653   pantoprazole (PROTONIX) EC tablet 40 mg  40 mg Oral Daily Gifford Shave, MD   40 mg at 03/02/20 1002   polyethylene glycol (MIRALAX / GLYCOLAX) packet 17 g  17 g Oral BID Lurline Del, DO   17 g at 03/02/20 1003   predniSONE (DELTASONE) tablet 5 mg  5 mg Oral Q breakfast Matilde Haymaker, MD   5 mg at 03/02/20 1003   pregabalin (LYRICA) capsule 100 mg  100 mg Oral TID Matilde Haymaker, MD   100 mg at 03/02/20 1002   rivaroxaban (XARELTO) tablet 10 mg  10 mg Oral Daily Gifford Shave, MD   10 mg at 03/02/20 1006   sodium chloride flush (NS) 0.9 % injection 10-40 mL  10-40 mL Intracatheter Q12H Chambliss, Jeb Levering, MD   10 mL at 03/02/20 1006   sodium chloride flush (NS) 0.9 % injection 10-40 mL  10-40 mL Intracatheter PRN Lind Covert, MD         Discharge Medications: Please see discharge summary for a list of discharge medications.  Relevant Imaging Results:  Relevant Lab Results:   Additional  Information SSN: [id number]  Benard Halsted, LCSW

## 2020-03-02 NOTE — TOC Initial Note (Signed)
Transition of Care [facility]) - Initial/Assessment Note    Patient Details  Name: Andrea Berry MRN: [id number] Date of Birth: 1966-09-03  [facility]) CM/SW Contact:    Benard Halsted, LCSW Phone Number: 03/02/2020, 4:13 PM  Clinical Narrative:                 CSW received consult for possible SNF placement at time of discharge. CSW spoke with patient regarding PT recommendation of SNF placement at time of discharge. Patient reported that she is feeling weaker but that she prefers to discharge home rather than SNF. She would like to speak with her son about the plan. CSW alerted her that we will conduct a SNF search just in case. No further questions reported at this time. CSW to continue to follow and assist with discharge planning needs.   Expected Discharge Plan: [place] Barriers to Discharge: Continued Medical Work up   Patient Goals and CMS Choice Patient states their goals for this hospitalization and ongoing recovery are:: Return home CMS Medicare.gov Compare Post Acute Care list provided to:: Patient Choice offered to / list presented to : Patient  Expected Discharge Plan and Services Expected Discharge Plan: [place] In-house Referral: Clinical Social Work   Post Acute Care Choice: [place] arrangements for the past 2 months: [place]                                      Prior Living Arrangements/Services Living arrangements for the past 2 months: Single Family Home Lives with:: Relatives Patient language and need for interpreter reviewed:: Yes Do you feel safe going back to the place where you live?: Yes      Need for Family Participation in Patient Care: Yes (Comment) Care giver support system in place?: Yes (comment) Current home services: DME (oxygen) Criminal Activity/Legal Involvement Pertinent to Current Situation/Hospitalization: No - Comment as needed  Activities of Daily  Living Home Assistive Devices/Equipment: Bedside commode/3-in-1, Oxygen, Walker (specify type), Wheelchair ADL Screening (condition at time of admission) Patient's cognitive ability adequate to safely complete daily activities?: Yes Is the patient deaf or have difficulty hearing?: No Does the patient have difficulty seeing, even when wearing glasses/contacts?: Yes Does the patient have difficulty concentrating, remembering, or making decisions?: No Patient able to express need for assistance with ADLs?: Yes Does the patient have difficulty dressing or bathing?: Yes Independently performs ADLs?: No Does the patient have difficulty walking or climbing stairs?: Yes Weakness of Legs: Both Weakness of Arms/Hands: None  Permission Sought/Granted Permission sought to share information with : Facility Art therapist granted to share information with : Yes, Verbal Permission Granted     Permission granted to share info w AGENCY: SNFs        Emotional Assessment   Attitude/Demeanor/Rapport: Gracious Affect (typically observed): Accepting, Appropriate Orientation: : Oriented to Self, Oriented to Place, Oriented to  Time, Oriented to Situation Alcohol / Substance Use: Not Applicable Psych Involvement: No (comment)  Admission diagnosis:  Pneumonia [J18.9] Patient Active Problem List   Diagnosis Date Noted  . Hypertrophic cardiomyopathy ([place]) 03/02/2020  . Microcytic anemia 02/26/2020  . Fever 02/26/2020  . Pneumonia 02/21/2020  . History of COVID-19 02/16/2020  . Physical deconditioning 02/16/2020  . Acute respiratory failure with hypoxia ([place]) 02/16/2020  . COVID-19 02/04/2020  . 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  ILD (interstitial lung disease) ([place]) 08/31/2019  . Cardiomegaly 08/31/2019  . High risk medication use 08/31/2019  . Elevated BP without diagnosis of hypertension 08/28/2019  . Constipation 08/04/2019  . Lupus ([place]) 04/02/2019  . Migraine  without aura 03/27/2019  . Steroid-induced osteopenia 03/23/2019  . Encounter for screening for diseases of the blood and blood-forming organs and certain disorders involving the immune mechanism 01/07/2019  . Chronic rhinitis 01/07/2019  . Current chronic use of systemic steroids 01/07/2019  . History of pulmonary embolism 12/10/2018  . Eustachian tube dysfunction, left 11/13/2018  . SLE (systemic lupus erythematosus related syndrome) ([place]) 11/07/2018  . Pigmented skin lesion of uncertain nature 79/48/0165  . Mild cognitive impairment 10/08/2018  . Personal history of immunosupression therapy 10/08/2018  . Long-term corticosteroid use 10/08/2018  . Chronic central neuropathic pain 10/02/2018  . Lupus anticoagulant disorder ([place]) 10/02/2018  . Anxiety 07/12/2018  . Chronic pain syndrome 07/12/2018  . Chronic coughing 09/10/2016  . 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[place]) 01/03/2016  . Ataxia 01/03/2016  . Dysesthesia 01/03/2016  . Anemia of chronic disease 10/28/2015  . Mood disorder ([place]) 09/21/2015  . Legal blindness 01/30/2014   PCP:  McDiarmid, Blane Ohara, MD Pharmacy:   [id number] [address], [place] [address] Phone: [phone] Fax: [phone]     Social Determinants of Health (SDOH) Interventions    Readmission Risk Interventions Readmission Risk Prevention Plan 03/02/2020  Transportation Screening Complete  PCP or Specialist Appt within 5-7 Days Complete  Home Care Screening Complete  Medication Review (RN CM) Complete  Some recent data might be hidden

## 2020-03-03 ENCOUNTER — Ambulatory Visit: Payer: Medicare Other

## 2020-03-03 DIAGNOSIS — J9611 Chronic respiratory failure with hypoxia: Secondary | ICD-10-CM

## 2020-03-03 LAB — COMPREHENSIVE METABOLIC PANEL
ALT: 103 U/L — ABNORMAL HIGH (ref 0–44)
AST: 182 U/L — ABNORMAL HIGH (ref 15–41)
Albumin: 2.3 g/dL — ABNORMAL LOW (ref 3.5–5.0)
Alkaline Phosphatase: 124 U/L (ref 38–126)
Anion gap: 12 (ref 5–15)
BUN: 9 mg/dL (ref 6–20)
CO2: 24 mmol/L (ref 22–32)
Calcium: 8.8 mg/dL — ABNORMAL LOW (ref 8.9–10.3)
Chloride: 102 mmol/L (ref 98–111)
Creatinine, Ser: 0.61 mg/dL (ref 0.44–1.00)
GFR calc Af Amer: >60 mL/min (ref >60)
GFR calc non Af Amer: >60 mL/min (ref >60)
Glucose, Bld: 95 mg/dL (ref 70–99)
Potassium: 3.6 mmol/L (ref 3.5–5.1)
Sodium: 138 mmol/L (ref 135–145)
Total Bilirubin: 1.2 mg/dL (ref 0.3–1.2)
Total Protein: 6.2 g/dL — ABNORMAL LOW (ref 6.5–8.1)

## 2020-03-03 LAB — CBC
HCT: 29.5 % — ABNORMAL LOW (ref 36.0–46.0)
Hemoglobin: 8.8 g/dL — ABNORMAL LOW (ref 12.0–15.0)
MCH: 20.8 pg — ABNORMAL LOW (ref 26.0–34.0)
MCHC: 29.8 g/dL — ABNORMAL LOW (ref 30.0–36.0)
MCV: 69.7 fL — ABNORMAL LOW (ref 80.0–100.0)
Platelets: 381 10*3/uL (ref 150–400)
RBC: 4.23 MIL/uL (ref 3.87–5.11)
RDW: 16.6 % — ABNORMAL HIGH (ref 11.5–15.5)
WBC: 5.5 10*3/uL (ref 4.0–10.5)
nRBC: 0 % (ref 0.0–0.2)

## 2020-03-03 MED ORDER — DILTIAZEM HCL 60 MG PO TABS
60.0000 mg | ORAL_TABLET | Freq: Three times a day (TID) | ORAL | Status: DC
  Administered 2020-03-03 (×2): 60 mg via ORAL
  Filled 2020-03-03 (×2): qty 1

## 2020-03-03 MED ORDER — ENSURE ENLIVE PO LIQD
237.0000 mL | Freq: Three times a day (TID) | ORAL | Status: DC
  Administered 2020-03-03: 237 mL via ORAL

## 2020-03-03 MED ORDER — ADULT MULTIVITAMIN W/MINERALS CH
1.0000 | ORAL_TABLET | Freq: Every day | ORAL | Status: DC
  Administered 2020-03-03: 1 via ORAL
  Filled 2020-03-03: qty 1

## 2020-03-03 MED ORDER — PREGABALIN 100 MG PO CAPS: 100.0000 mg | ORAL_CAPSULE | Freq: Three times a day (TID) | ORAL | 0 refills | Status: DC

## 2020-03-03 MED ORDER — DILTIAZEM HCL 60 MG PO TABS: 60.0000 mg | ORAL_TABLET | Freq: Three times a day (TID) | ORAL | 0 refills | Status: DC

## 2020-03-03 MED FILL — dilTIAZem HCL 60 MG TABS: 60 | 30 days supply | Qty: 90 | Fill #0

## 2020-03-03 MED FILL — PREGABALIN 100 MG CAPS: 100 | 10 days supply | Qty: 30 | Fill #0

## 2020-03-03 NOTE — Care Management Important Message (Signed)
Important Message  Patient Details  Name: Andrea Berry MRN: [id number] Date of Birth: 24-Jun-1966   Medicare Important Message Given:  Yes - Important Message mailed due to current National Emergency  Verbal consent obtained due to current National Emergency  Relationship to patient: Self Contact Name: Tanayia Wahlquist Call Date: 03/03/20  Time: 1550 Phone: [phone] Outcome: Spoke with contact Important Message mailed to: Patient address on file    Delorse Lek 03/03/2020, 3:51 PM

## 2020-03-03 NOTE — TOC Transition Note (Signed)
Transition of Care [facility]) - CM/SW Discharge Note   Patient Details  Name: Andrea Berry MRN: [id number] Date of Birth: Jan 14, 1966  Transition of Care [facility]) CM/SW Contact:  Pollie Friar, RN Phone Number: 03/03/2020, 4:11 PM   Clinical Narrative:    Pt discharging home with [facility] services through [place]. Cory with [facility] accepted the referral.  Pt already has oxygen at home via [place]. They are aware of her return home and orders for 5 L [place].  Her concentrator goes up to 5 L at home but they will  switch it out tomorrow for a higher concentrator.  Son is requesting PTAR home. CM has arranged this transportation. CM has updated bedside RN and will place d/c packet at the desk. Son aware of all of the above.   Final next level of care: [place] Barriers to Discharge: No Barriers Identified   Patient Goals and CMS Choice Patient states their goals for this hospitalization and ongoing recovery are:: Return home CMS Medicare.gov Compare Post Acute Care list provided to:: Patient Represenative (must comment) Choice offered to / list presented to : Adult Children  Discharge Placement                       Discharge Plan and Services In-house Referral: Clinical Social Work   Post Acute Care Choice: Home Health                    HH Arranged: PT [facility] Agency: [place] Date [facility] Agency Contacted: 03/03/20   Representative spoke with at [place]: [place] ([place]) Interventions     Readmission Risk Interventions Readmission Risk Prevention Plan 03/02/2020  Transportation Screening Complete  PCP or Specialist Appt within 5-7 Days Complete  Home Care Screening Complete  Medication Review (RN CM) Complete  Some recent data might be hidden

## 2020-03-03 NOTE — Progress Notes (Signed)
Occupational Therapy Re-Evaluation Patient Details Name: Andrea Berry MRN: [id number] DOB: 1965-11-11 Today's Date: 03/03/2020    History of Present Illness 54 y.o. female presented to ED 02/21/20 with shortness of breath 2/2 recent Covid infection (5/27) with hospitalization 5/27-6/1. +cardiomyopathy   PMH is significant for neuromyelitis optica/legal blindness, SLE, chronic interstitial lung disease,hx of PE, chronic central neuropathic pain, mild cognitive impairment, mood disorder, steroid-induced osteopenia, migraine without aura, HTN, CVA, transverse myelitis with chronic LUE, LLE weakness   Clinical Impression   Patient requiring increased assistance today as compared to last week's visits.  Able to stand with min guard and transfer to [facility] with min assist and bilateral support, though patient moving very slowly.  RR still increasing with activity and she requires consistent verbals cues to practice slower, pursed lip breathing.  RR increased to 46 and SpO2 decreased to 86 on 2L O2.  She required max assist with toileting today.  Patient continues to be flat.  Would recommend SNF at this time although patient is refusing SNF.  Will continue to follow with OT acutely to address the deficits listed below.      Follow Up Recommendations  Home health OT;Supervision/Assistance - 24 hour;Other (comment) (patient refusing SNF)    Equipment Recommendations  None recommended by OT    Recommendations for Other Services       Precautions / Restrictions Precautions Precautions: Fall Precaution Comments: legally blind. Monitor RR, HR, and O2      Mobility Bed Mobility Overal bed mobility: Needs Assistance Bed Mobility: Supine to Sit;Sit to Supine     Supine to sit: Modified independent (Device/Increase time);HOB elevated Sit to supine: Modified independent (Device/Increase time)   General bed mobility comments: pt requesting HOB elevated as I was lowering HOB; she acknowledged she does  not have an adjustable bed at home and proceeded to sit up prior to [facility] flat  Transfers Overall transfer level: Needs assistance Equipment used: 2 person hand held assist Transfers: Sit to/from [facility] Sit to Stand: Min guard Stand pivot transfers: Min assist       General transfer comment: repeated x 3; pt prefers one hand on RW (due to vision and painful IV) with other hand pushing up from bed; denied dizziness    Balance Overall balance assessment: Needs assistance Sitting-balance support: No upper extremity supported;Feet unsupported Sitting balance-Leahy Scale: Fair     Standing balance support: Bilateral upper extremity supported Standing balance-Leahy Scale: Poor Standing balance comment: needs external support in standing.                            ADL either performed or assessed with clinical judgement   ADL Overall ADL's : Needs assistance/impaired                         Toilet Transfer: Minimal assistance;BSC   Toileting- Clothing Manipulation and Hygiene: Maximal assistance;Sit to/from stand       Functional mobility during ADLs: Minimal assistance General ADL Comments: Patient declined since previous visit, requiring more assist and moving much slower     Vision         Perception     Praxis      Pertinent Vitals/Pain Pain Assessment: No/denies pain     Hand Dominance     Extremity/Trunk Assessment Upper Extremity Assessment Upper Extremity Assessment: Generalized weakness           Communication  Cognition Arousal/Alertness: Awake/alert Behavior During Therapy: Flat affect Overall Cognitive Status: Within Functional Limits for tasks assessed                                     General Comments      Exercises Exercises: Other exercises Other Exercises Other Exercises: pursed lip breathing   Shoulder Instructions      Home Living                                           Prior Functioning/Environment                   OT Problem List:        OT Treatment/Interventions:      OT Goals(Current goals can be found in the care plan section) Acute Rehab OT Goals Patient Stated Goal: to get better and get back home OT Goal Formulation: With patient Time For Goal Achievement: 03/07/20 Potential to Achieve Goals: Good  OT Frequency: Min 2X/week   Barriers to D/C:            Co-evaluation              AM-PAC OT "6 Clicks" Daily Activity     Outcome Measure Help from another person eating meals?: A Little Help from another person taking care of personal grooming?: A Little Help from another person toileting, which includes using toliet, bedpan, or urinal?: A Lot Help from another person bathing (including washing, rinsing, drying)?: A Lot Help from another person to put on and taking off regular upper body clothing?: A Little Help from another person to put on and taking off regular lower body clothing?: A Little 6 Click Score: 16   End of Session Equipment Utilized During Treatment: Oxygen Nurse Communication: Mobility status  Activity Tolerance: Patient tolerated treatment well Patient left: with call bell/phone within reach;in bed;with bed alarm set  OT Visit Diagnosis: Unsteadiness on feet (R26.81);Other abnormalities of gait and mobility (R26.89);Muscle weakness (generalized) (M62.81)                Time: 1202-1226 OT Time Calculation (min): 24 min Charges:  OT General Charges $OT Visit: 1 Visit OT Evaluation $OT Re-eval: 1 Re-eval OT Treatments $Self Care/Home Management : 8-22 mins  August Luz, OTR/L   Phylliss Bob 03/03/2020, 3:18 PM

## 2020-03-03 NOTE — Discharge Instructions (Signed)
You were admitted to the hospital due to shortness of breath and initially found to have a bacterial pneumonia.  You were treated for this but continued to have fevers after the treatment started.  We initially brought on infectious disease and spoke to rheumatology to try to determine what the source of the fevers might be.  It was determined that the fevers might be a result of one of the antibiotics that you are on and this antibiotic was stopped at the conclusion of your treatment for the bacterial pneumonia.  After stopping this antibiotic your fevers resolved and your vitals improved.  During the hospitalization we did do an echo of your heart which showed possible hypertrophic obstructive cardiomyopathy, because of this cardiology was brought on board and determined the need to start a medication called Cardizem.  Your Linzess was reduced to 100 mg 3 times per day.  We also recommend stopping your Flexeril and Ativan as these can make you tired and can increase risk of falls and respiratory depression.

## 2020-03-03 NOTE — Progress Notes (Signed)
Initial Nutrition Assessment  DOCUMENTATION CODES:   Obesity unspecified  INTERVENTION:   -MVI with minerals daily -Ensure Enlive po TID, each supplement provides 350 kcal and 20 grams of protein -Magic cup TID with meals, each supplement provides 290 kcal and 9 grams of protein -Hormel Shake TID with meals, each supplement provides 520 kcals and 22 grams protein  NUTRITION DIAGNOSIS:   Increased nutrient needs related to acute illness (COVID-19) as evidenced by estimated needs.  GOAL:   Patient will meet greater than or equal to 90% of their needs  MONITOR:   PO intake, Supplement acceptance, Labs, Weight trends, Skin, I & O's  REASON FOR ASSESSMENT:   Consult Assessment of nutrition requirement/status  ASSESSMENT:   Andrea Berry is a 54 y.o. female presenting with acute on chronic respiratory failure likely due to superimposed H. Influenzae pneumoniae in setting of prolonged COVID19. PMH is significant for legal blindness, SLE, chronic interstitial lung disease,hx of PE on Xarelto, chronic central neuropathic pain, mild cognitive impairment, mood disorder, neuromyelitis optica, steroid-induced osteopenia, migraine without aura.  Pt admitted with acute hypoxic respiratory failure and COVID-19.   Reviewed I/O's: +471 ml x 24 hours and +5.4 L since admission  UOP: 450 ml x 24 hours  Attempted to speak with pt via phone, however, no answer.   Per chart review, pt reports of having no appetite since COVID-19 diagnosis. Intake has declined over the past few days. Noted pt was consuming 50-100% of meals, but now intake has declined to 0-10%.   Given increased nutritional needs for COVID-19 as well as poor oral intake, pt would benefit from addition of oral nutrition supplements.   Reviewed wt hx; pt has experienced a 8.2% wt loss over the past 6 months. While this is not significant for time frame, it is concerning given decreased appetite and increased nutrition needs for  COVID-19.   Medications reviewed and include prednisone, cardizem and miralax.   Labs reviewed.   Diet Order:   Diet Order            Diet Heart Room service appropriate? Yes; Fluid consistency: Thin  Diet effective now                 EDUCATION NEEDS:   No education needs have been identified at this time  Skin:  Skin Assessment: Reviewed RN Assessment  Last BM:  03/03/20  Height:   Ht Readings from Last 1 Encounters:  02/21/20 5\' 1"  (1.549 m)    Weight:   Wt Readings from Last 1 Encounters:  02/21/20 88.3 kg    Ideal Body Weight:  47.7 kg  BMI:  Body mass index is 36.78 kg/m.  Estimated Nutritional Needs:   Kcal:  1900-2100  Protein:  95-110 grams  Fluid:  > 1.9 L    Loistine Chance, RD, LDN, [place] Registered Dietitian II Certified Diabetes Care and Education Specialist Please refer to [facility] for RD and/or RD on-call/weekend/after hours pager

## 2020-03-03 NOTE — Progress Notes (Signed)
CM called and spoke to Therapy office to see if pt could be seen today for determination of needs at d/c. Person spoke to was going to try and see if pt can be seen today.

## 2020-03-03 NOTE — Progress Notes (Signed)
Family Medicine Teaching Service Daily Progress Note Intern Pager: [phone]  Patient name: Andrea Berry Medical record number: [id number] Date of birth: 26-Jan-1966 Age: 54 y.o. Gender: female  Primary Care Provider: McDiarmid, Blane Ohara, MD Consultants: None Code Status: Full  Pt Overview and Major Events to Date:  6/13 - admitted 6/18- ID consulted, transitioned back to Ceftriaxone for H. Influenzae pneumonia   Assessment and Plan: Andrea Berry is a 54 y.o. female presenting with acute on chronic respiratory failure likely due to superimposed H. Influenzae pneumoniae in setting of prolonged COVID19. PMH is significant for legal blindness, SLE, chronic interstitial lung disease,hx of PE on Xarelto, chronic central neuropathic pain, mild cognitive impairment, mood disorder, neuromyelitis optica, steroid-induced osteopenia, migraine without aura.  Acute hypoxic respiratory failure in the setting of previous Haemophilus influenza pneumonia and prolonged COVID-19 Diagnosed with COVID-19 on 5/27.   Chest CT showed multifocal bilateral pneumonia with no significant change since previous x-ray, trace bilateral pleural effusions with minimal dependent lower lobe atelectasis. Infectious disease was consulted who feels this is most likely due to H influenza superimposed upon COVID-19 infection with persistent inflammatory response.  Recommend ceftriaxone alone and continue isolation.  Patient continued with some fevers overnight to 103.1.  Currently on 4 L nasal cannula satting 94%. CXR repeat shows slight increased ground-glass opacity and consolidation in the right upper lobe though with some clearing of infiltrate from right base. Remaining bilateral pneumonia otherwise unchanged.  Spoke with [facility] rheumatology yesterday who recommended a number of labs to monitor for possible causes of this patient's ongoing increased oxygen needs and fevers.  Ferritin 942> 2812.  CRP elevated at 24.8.  WBC7.1.   Echo performed yesterday showed ejection fraction of 78-75% with no regional wall abnormalities of the left ventricle but possible HOCM.  Patient did have a mildly elevated troponin of 25 which was likely in the setting of demand ischemia.  Patient's inflammatory markers continue to rise with LDH 751, ferritin 2812, CRP 24.8.  Urinalysis showed small hemoglobin, 5 ketones, negative leuk, negative nitrite, protein elevated at 100.  Patient remains afebrile since discontinuing ceftriaxone making drug fever seem more likely, other possibilities include prolonged Covid with fever resolving at the same time of discontinuing ceftriaxone by coincidence.  Patient currently on 3 L nasal cannula satting 97%. -ID consult, appreciate recommendations. -Cardiology consult, appreciate recommendations-cardiology signed off  -Increase Cardizem to 60 mg by mouth every 8 hours  -Avoid beta-blocker  -Titrate up calcium channel blocker as blood pressure allows   Persistent fevers-resolved Patient's last fever was 0600 on 6/22, prior to this the patient had been favoring once or twice per day in the setting of ceftriaxone use.   -Monitor for fevers, treat fevers if symptomatic.  Remains afebrile. -We will hold acetaminophen and NSAIDs in the presence of elevated LFTs, can consider small dose of ibuprofen if needed for symptomatic fevers.  LFT elevated: Alk phos 138>127> 124, AST 270>228> 182, ALT 113>107> 103.  PT and INR within normal limits but at the upper range of  15.1, 1.2 respectively. Right upper quadrant abdominal ultrasound with cholelithiasis with equivocal sonographic findings for acute cholecystitis.  Patient nonreactive to hep C, hep B surface antigen and core antibody, hep A.  No abdominal pain on physical exam. -We will hold Tylenol and NSAIDs, can consider small dose of ibuprofen if needed for symptomatic fevers.  Constipation Patient states even with MiraLAX she has been unable to have a bowel  movement.  She states she  does not think she has had one since this hospitalization.  Discussed with patient about attempting soapsuds enema which patient agreed to.  Patient was able to have a bowel movement 6/20 after the soapsuds enema. -Continue MiraLAX scheduled  Anemia Patient CBC had a hemoglobin 8.8 today from 10.5 on 6/13.  MCV low at 67.5.  Hemoglobin 6/18 was 9.2.  Patient not endorsing any sources of bleeding.  Iron panel shows iron reduced at 6, saturation ratio reduced at 3, ferritin increased at 942 suggestive of anemia of chronic disease.  Patient's ferritin can also be increased as an acute phase reactant in the setting of inflammation however with the patient's history of lupus anemia of chronic disease seems most consistent.  Haptoglobin within normal limits at 331, C3 elevated at 214, C4 elevated at 65. -Peripheral smear as above  AKI -  resolved Creatinine on arrival 1.27, currently 0.74.  Creatinine at discharge on 6/1 was 0.87.  Most likely perfusion related. -Monitor vital signs -Avoid nephrotoxic agents  Neuromyelitis optica Patient is legally blind Currently following with ophthalmology.  No acute changes in vision -Continue home ophthalmic solution  SLE Follows with [facility]ology.  She was on hydroxychloroquine but is not taking it per her ophthalmologist request.  Home medication includes prednisone 5 mg daily and she receives rituximab infusions every 6 months.  While hospitalized for Covid 2 weeks ago she received a course of Decadron. -Restarted patient's home prednisone after discontinued Decadron  History of PE PE occurred in 2017.  Home medications include Xarelto 10 mg daily.  Patient negative for antiphospholipid syndrome -Continue home Xarelto  History of transverse myelitis, Devic's disease Patient pain developed in 2017.  Chronic left upper and lower extremity weakness.  Home medications include tramadol, Neurontin, Lyrica,  Trileptal. -Continue home schedule Trileptal -Reduce patient's Lyrica to 100 mg 3 times per day -On tramadol as needed  Chronic interstitial lung disease Patient sees pulmonology as well as asthma and allergy.  PFT on 11/24/2018 unremarkable.  Per chart review from previous admission the patient had PFT performed December 2020 which they were unable to find records for.  Home medication includes Flonase nasal spray.  Insomnia Home medication includes Ativan 0.5 mg nightly. -Melatonin as needed  Prednisone-induced osteoporosis Patient is on alendronate diet at home, is supposed to be on 2-year holiday given longstanding use  Mood disorder Home medication includes Cymbalta 60 mg daily -Continue home Cymbalta  Hypertension Most recent blood pressure 117/66.  Patient was previously recommended to take Norvasc by cardiology but it does not appear that she is on any antihypertensive at this time. -Continue to monitor  Aortic regurgitation Patient had evidence on TEE with no evidence of structural changes.  No treatment necessary per cardiology.  History of CVA Patient had CVA in 2008.  Home medications include Xarelto -Continue home Xarelto, no antiplatelets  FEN/GI: regular diet PPx: Xarelto  Disposition: Likely medically stable in the next 24 to 48 hours, PT previously recommended SNF in last note  Subjective:  Patient states she still feels winded when she gets up and moves around but feels better when sitting in bed than in previous days.  Objective: Temp:  [97.9 F (36.6 C)-99.4 F (37.4 C)] 98.3 F (36.8 C) (06/24 0400) Pulse Rate:  [83-117] 83 (06/24 0400) Resp:  [21-34] 21 (06/24 0400) BP: (92-131)/(59-78) 117/66 (06/24 0400) SpO2:  [90 %-97 %] 97 % (06/24 0400) Physical Exam: General: Alert and oriented in no apparent distress Heart: Regular rate and  rhythm with no murmurs appreciated Lungs: Faint crackles in bilateral bases, seem to be improved from  yesterday, nasal cannula in place on 1 L Abdomen: Bowel sounds present, no abdominal pain Skin: Warm and dry  Laboratory: Recent Labs  Lab 02/29/20 0307 03/01/20 0920 03/02/20 0222  WBC 7.0 7.1 7.1  HGB 9.1* 8.6* 8.4*  HCT 28.5* 27.3* 27.4*  PLT 306 318 310   Recent Labs  Lab 02/28/20 0404 02/29/20 0307 03/01/20 0920 03/01/20 1155 03/02/20 0222  NA  --  136 137  --  141  K  --  3.6 3.6  --  3.7  CL  --  103 104  --  105  CO2  --  19* 23  --  23  BUN  --  7 7  --  8  CREATININE  --  0.84 0.74  --  0.74  CALCIUM  --  8.4* 8.8*  --  8.8*  PROT   < >  --  5.8* 6.0* 5.8*  BILITOT   < >  --  0.9 0.9 0.8  ALKPHOS   < >  --  138* 133* 127*  ALT   < >  --  113* 111* 107*  AST   < >  --  270* 273* 228*  GLUCOSE  --  102* 110*  --  90   < > = values in this interval not displayed.    Imaging/Diagnostic Tests: DG Chest 2 View  Result Date: 02/29/2020 CLINICAL DATA:  Shortness of breath and weakness EXAM: CHEST - 2 VIEW COMPARISON:  02/24/2020, 02/21/2020, 02/04/2020, CT 02/25/2020 FINDINGS: Small bilateral pleural effusions. Enlarged cardiomediastinal silhouette with central vascular congestion. Diffuse increased interstitial and ground-glass opacity with patchy bilateral consolidations. No pneumothorax. IMPRESSION: 1. Slight increased ground-glass opacity and consolidation in the right upper lobe though with some clearing of infiltrate from right base. Remaining bilateral pneumonia otherwise unchanged. 2. There is cardiomegaly, vascular congestion and small pleural effusions. There may be an underlying component of mild interstitial edema. Electronically Signed   By: Donavan Foil M.D.   On: 02/29/2020 16:22   CT Head Wo Contrast  Result Date: 02/04/2020 CLINICAL DATA:  Acute pain due to trauma. EXAM: CT HEAD WITHOUT CONTRAST TECHNIQUE: Contiguous axial images were obtained from the base of the skull through the vertex without intravenous contrast. COMPARISON:  MRI dated November 25, 2015. CT head dated September 21, 2015. FINDINGS: Brain: No evidence of acute infarction, hemorrhage, hydrocephalus, extra-axial collection or mass lesion/mass effect. Vascular: No hyperdense vessel or unexpected calcification. Skull: Normal. Negative for fracture or focal lesion. Sinuses/Orbits: There is pansinus mucosal thickening. The mastoid air cells are essentially clear. Other: None. IMPRESSION: 1. No acute intracranial abnormality. 2. Pansinus mucosal thickening. Electronically Signed   By: Constance Holster M.D.   On: 02/04/2020 17:52   CT CHEST W CONTRAST  Result Date: 02/25/2020 CLINICAL DATA:  Pneumonia EXAM: CT CHEST WITH CONTRAST TECHNIQUE: Multidetector CT imaging of the chest was performed during intravenous contrast administration. CONTRAST:  154m OMNIPAQUE IOHEXOL 300 MG/ML  SOLN COMPARISON:  02/24/2020 FINDINGS: Cardiovascular: Heart is unremarkable without pericardial effusion. Normal caliber of the thoracic aorta. No significant atherosclerosis. Mediastinum/Nodes: No enlarged mediastinal, hilar, or axillary lymph nodes. Thyroid gland, trachea, and esophagus demonstrate no significant findings. Lungs/Pleura: There is multifocal bilateral airspace disease, most pronounced within the left upper lobe. Trace bilateral pleural effusions are noted. Minimal dependent lower lobe atelectasis. No pneumothorax. Central airways are patent. Upper Abdomen: No acute abnormality.  Musculoskeletal: No acute or destructive bony lesions. Reconstructed images demonstrate no additional findings. IMPRESSION: 1. Multifocal bilateral pneumonia, not significantly changed since previous chest x-ray. 2. Trace bilateral pleural effusions and minimal dependent lower lobe atelectasis. Electronically Signed   By: Randa Ngo M.D.   On: 02/25/2020 23:57   DG CHEST PORT 1 VIEW  Result Date: 02/24/2020 CLINICAL DATA:  Fever EXAM: PORTABLE CHEST 1 VIEW COMPARISON:  February 21, 2020 FINDINGS: There is airspace opacity  throughout the right mid and lower lung regions as well as in the left mid lung and left base regions. There is atelectatic change in the medial right base. There is a probable small left pleural effusion. Heart is slightly enlarged with pulmonary vascularity normal. No adenopathy. No bone lesions. IMPRESSION: Multifocal pneumonia, slightly increased in the left mid lung and stable elsewhere. Probable small left pleural effusion. Medial right base atelectasis. Stable cardiac prominence.  No adenopathy evident. Electronically Signed   By: Lowella Grip III M.D.   On: 02/24/2020 10:20   DG Chest Port 1 View  Result Date: 02/21/2020 CLINICAL DATA:  Shortness of breath.  COVID positive. EXAM: PORTABLE CHEST 1 VIEW COMPARISON:  Feb 04, 2020 FINDINGS: There are hazy bilateral airspace opacities, greatest at the lung bases bilaterally. There is no pneumothorax. There are small bilateral pleural effusions, right greater than left. The cardiac silhouette appears enlarged but stable from prior study. There is no acute osseous abnormality. IMPRESSION: 1. Multifocal pneumonia (viral or bacterial). 2. Small bilateral pleural effusions, right greater than left. Electronically Signed   By: Constance Holster M.D.   On: 02/21/2020 19:09   DG Chest Port 1 View  Result Date: 02/04/2020 CLINICAL DATA:  COVID-19.  Cough.  Shortness of breath. EXAM: PORTABLE CHEST 1 VIEW COMPARISON:  11/06/2018 FINDINGS: The heart size and pulmonary vascularity are normal. Slight haziness at the left lung base laterally. Chronic scarring at the right lung base. No effusions. No bone abnormality of significance. IMPRESSION: Slight haziness at the left lung base laterally which could represent a small area of infiltrate. Scarring at the right lung base. Electronically Signed   By: Lorriane Shire M.D.   On: 02/04/2020 14:48   ECHOCARDIOGRAM COMPLETE  Result Date: 03/01/2020    ECHOCARDIOGRAM REPORT   Patient Name:   Andrea Berry Date of  Exam: 03/01/2020 Medical Rec #:  [id number]     Height:       61.0 in Accession #:    [id number]    Weight:       194.7 lb Date of Birth:  May 08, 1966     BSA:          1.867 m Patient Age:    47 years      BP:           105/60 mmHg Patient Gender: F             HR:           111 bpm. Exam Location:  Inpatient Procedure: 2D Echo, Cardiac Doppler and Color Doppler Indications:    Abnormal ECG  History:        Patient has prior history of Echocardiogram examinations, most                 recent 10/17/2015. H/o pulmonary embolus. COVID-19.  Sonographer:    Clayton Lefort RDCS (AE) Referring Phys: [id number] CARINA M BROWN  Sonographer Comments: Patient is morbidly obese. Image acquisition challenging due to patient body habitus. IMPRESSIONS  1. Left ventricular ejection fraction, by estimation, is 70 to 75%. The left ventricle has hyperdynamic function. The left ventricle has no regional wall motion abnormalities. There is moderate concentric left ventricular hypertrophy and severe basal septal hypertrophy. There is a mid intracavitary gradient due to hyperdynamic LVF with peak resting gradient 28mHg.  2. Right ventricular systolic function is normal. The right ventricular size is normal. Tricuspid regurgitation signal is inadequate for assessing PA pressure.  3. The mitral valve is normal in structure. No evidence of mitral valve regurgitation. No evidence of mitral stenosis.  4. The aortic valve is normal in structure. Aortic valve regurgitation is not visualized. No aortic stenosis is present.  5. The inferior vena cava is normal in size with greater than 50% respiratory variability, suggesting right atrial pressure of 3 mmHg.  6. Findings consistent with possible HOCM. No evidence of SAM. FINDINGS  Left Ventricle: Left ventricular ejection fraction, by estimation, is 70 to 75%. The left ventricle has hyperdynamic function. The left ventricle has no regional wall motion abnormalities. The left ventricular internal cavity size  was normal in size. There is moderate concentric left ventricular hypertrophy. Left ventricular diastolic parameters are consistent with Grade I diastolic dysfunction (impaired relaxation). Normal left ventricular filling pressure. Right Ventricle: The right ventricular size is normal. No increase in right ventricular wall thickness. Right ventricular systolic function is normal. Tricuspid regurgitation signal is inadequate for assessing PA pressure. Left Atrium: Left atrial size was normal in size. Right Atrium: Right atrial size was not well visualized. Pericardium: There is no evidence of pericardial effusion. Mitral Valve: The mitral valve is normal in structure. Normal mobility of the mitral valve leaflets. No evidence of mitral valve regurgitation. No evidence of mitral valve stenosis. Tricuspid Valve: The tricuspid valve is normal in structure. Tricuspid valve regurgitation is not demonstrated. No evidence of tricuspid stenosis. Aortic Valve: The aortic valve is normal in structure. Aortic valve regurgitation is not visualized. No aortic stenosis is present. Pulmonic Valve: The pulmonic valve was normal in structure. Pulmonic valve regurgitation is not visualized. No evidence of pulmonic stenosis. Aorta: The aortic root is normal in size and structure. Venous: The inferior vena cava is normal in size with greater than 50% respiratory variability, suggesting right atrial pressure of 3 mmHg. IAS/Shunts: No atrial level shunt detected by color flow Doppler.  LEFT VENTRICLE PLAX 2D LVIDd:         3.10 cm  Diastology LVIDs:         1.90 cm  LV e' lateral:   4.57 cm/s LV PW:         1.50 cm  LV E/e' lateral: 12.5 LV IVS:        2.10 cm  LV e' medial:    5.00 cm/s LVOT diam:     1.80 cm  LV E/e' medial:  11.4 LVOT Area:     2.54 cm  LEFT ATRIUM             Index LA diam:        3.40 cm 1.82 cm/m LA Vol (A2C):   43.4 ml 23.24 ml/m LA Vol (A4C):   36.3 ml 19.44 ml/m LA Biplane Vol: 41.0 ml 21.96 ml/m   AORTA Ao  Root diam: 3.00 cm Ao Asc diam:  2.80 cm MITRAL VALVE MV Area (PHT): 4.17 cm    SHUNTS MV Decel Time: 182 msec    Systemic Diam: 1.80 cm MV E velocity: 57.20 cm/s MV A velocity: 69.10 cm/s MV  E/A ratio:  0.83 Fransico Him MD Electronically signed by Fransico Him MD Signature Date/Time: 03/01/2020/1:32:53 PM    Final    US Abdomen Limited RUQ  Result Date: 03/01/2020 CLINICAL DATA:  54 year old female with elevated LFTs. EXAM: ULTRASOUND ABDOMEN LIMITED RIGHT UPPER QUADRANT COMPARISON:  Right upper quadrant ultrasound dated 08/29/2012. FINDINGS: Gallbladder: There is a 15 mm stone in the gallbladder. No gallbladder wall thickening or pericholecystic fluid. Reported positive sonographic Murphy's sign. Common bile duct: Diameter: 7 mm Liver: No focal lesion identified. Within normal limits in parenchymal echogenicity. Portal vein is patent on color Doppler imaging with normal direction of blood flow towards the liver. Other: None. IMPRESSION: Cholelithiasis with equivocal sonographic findings for acute cholecystitis. A hepatobiliary scintigraphy may provide better evaluation of the gallbladder if there is a high clinical concern for acute cholecystitis . Electronically Signed   By: Anner Crete M.D.   On: 03/01/2020 20:06    Lurline Del, DO 03/03/2020, 6:36 AM PGY-1, [place] Intern pager: [phone], text pages welcome

## 2020-03-03 NOTE — Progress Notes (Signed)
Subjective:  Patient denies any chest pain states breathing has improved overall feels better.  Tolerating low-dose Cardizem heart rate has improved.  Objective:  Vital Signs in the last 24 hours: Temp:  [97.9 F (36.6 C)-98.5 F (36.9 C)] 97.9 F (36.6 C) (06/24 0746) Pulse Rate:  [83-113] 93 (06/24 0746) Resp:  [20-31] 20 (06/24 0746) BP: (92-131)/(59-68) 130/65 (06/24 0748) SpO2:  [90 %-97 %] 96 % (06/24 0746)  Intake/Output from previous day: 06/23 0701 - 06/24 0700 In: 920.9 [P.O.:480; I.V.:440.9] Out: 450 [Urine:450] Intake/Output from this shift: Total I/O In: -  Out: 350 [Urine:350]  Physical Exam: Neck: no adenopathy, no carotid bruit, no JVD and supple, symmetrical, trachea midline Lungs: Faint bilateral rhonchi noted Heart: regular rate and rhythm, S1, S2 normal and No S3 gallop Abdomen: soft, non-tender; bowel sounds normal; no masses,  no organomegaly Extremities: extremities normal, atraumatic, no cyanosis or edema  Lab Results: Recent Labs    03/02/20 0222 03/03/20 0903  WBC 7.1 5.5  HGB 8.4* 8.8*  PLT 310 381   Recent Labs    03/02/20 0222 03/03/20 0903  NA 141 138  K 3.7 3.6  CL 105 102  CO2 23 24  GLUCOSE 90 95  BUN 8 9  CREATININE 0.74 0.61   No results for input(s): TROPONINI in the last 72 hours.  Invalid input(s): CK, MB Hepatic Function Panel Recent Labs    03/01/20 1155 03/02/20 0222 03/03/20 0903  PROT 6.0*   < > 6.2*  ALBUMIN 2.2*   < > 2.3*  AST 273*   < > 182*  ALT 111*   < > 103*  ALKPHOS 133*   < > 124  BILITOT 0.9   < > 1.2  BILIDIR 0.3*  --   --   IBILI 0.6  --   --    < > = values in this interval not displayed.   No results for input(s): CHOL in the last 72 hours. No results for input(s): PROTIME in the last 72 hours.  Imaging: Imaging results have been reviewed and ECHOCARDIOGRAM COMPLETE  Result Date: 03/01/2020    ECHOCARDIOGRAM REPORT   Patient Name:   Andrea Berry Date of Exam: 03/01/2020 Medical Rec #:   [id number]     Height:       61.0 in Accession #:    [id number]    Weight:       194.7 lb Date of Birth:  04-14-1966     BSA:          1.867 m Patient Age:    54 years      BP:           105/60 mmHg Patient Gender: F             HR:           111 bpm. Exam Location:  Inpatient Procedure: 2D Echo, Cardiac Doppler and Color Doppler Indications:    Abnormal ECG  History:        Patient has prior history of Echocardiogram examinations, most                 recent 10/17/2015. H/o pulmonary embolus. COVID-19.  Sonographer:    Clayton Lefort RDCS (AE) Referring Phys: [id number] CARINA M BROWN  Sonographer Comments: Patient is morbidly obese. Image acquisition challenging due to patient body habitus. IMPRESSIONS  1. Left ventricular ejection fraction, by estimation, is 70 to 75%. The left ventricle has hyperdynamic function. The left ventricle  has no regional wall motion abnormalities. There is moderate concentric left ventricular hypertrophy and severe basal septal hypertrophy. There is a mid intracavitary gradient due to hyperdynamic LVF with peak resting gradient 60mmHg.  2. Right ventricular systolic function is normal. The right ventricular size is normal. Tricuspid regurgitation signal is inadequate for assessing PA pressure.  3. The mitral valve is normal in structure. No evidence of mitral valve regurgitation. No evidence of mitral stenosis.  4. The aortic valve is normal in structure. Aortic valve regurgitation is not visualized. No aortic stenosis is present.  5. The inferior vena cava is normal in size with greater than 50% respiratory variability, suggesting right atrial pressure of 3 mmHg.  6. Findings consistent with possible HOCM. No evidence of SAM. FINDINGS  Left Ventricle: Left ventricular ejection fraction, by estimation, is 70 to 75%. The left ventricle has hyperdynamic function. The left ventricle has no regional wall motion abnormalities. The left ventricular internal cavity size was normal in size. There is  moderate concentric left ventricular hypertrophy. Left ventricular diastolic parameters are consistent with Grade I diastolic dysfunction (impaired relaxation). Normal left ventricular filling pressure. Right Ventricle: The right ventricular size is normal. No increase in right ventricular wall thickness. Right ventricular systolic function is normal. Tricuspid regurgitation signal is inadequate for assessing PA pressure. Left Atrium: Left atrial size was normal in size. Right Atrium: Right atrial size was not well visualized. Pericardium: There is no evidence of pericardial effusion. Mitral Valve: The mitral valve is normal in structure. Normal mobility of the mitral valve leaflets. No evidence of mitral valve regurgitation. No evidence of mitral valve stenosis. Tricuspid Valve: The tricuspid valve is normal in structure. Tricuspid valve regurgitation is not demonstrated. No evidence of tricuspid stenosis. Aortic Valve: The aortic valve is normal in structure. Aortic valve regurgitation is not visualized. No aortic stenosis is present. Pulmonic Valve: The pulmonic valve was normal in structure. Pulmonic valve regurgitation is not visualized. No evidence of pulmonic stenosis. Aorta: The aortic root is normal in size and structure. Venous: The inferior vena cava is normal in size with greater than 50% respiratory variability, suggesting right atrial pressure of 3 mmHg. IAS/Shunts: No atrial level shunt detected by color flow Doppler.  LEFT VENTRICLE PLAX 2D LVIDd:         3.10 cm  Diastology LVIDs:         1.90 cm  LV e' lateral:   4.57 cm/s LV PW:         1.50 cm  LV E/e' lateral: 12.5 LV IVS:        2.10 cm  LV e' medial:    5.00 cm/s LVOT diam:     1.80 cm  LV E/e' medial:  11.4 LVOT Area:     2.54 cm  LEFT ATRIUM             Index LA diam:        3.40 cm 1.82 cm/m LA Vol (A2C):   43.4 ml 23.24 ml/m LA Vol (A4C):   36.3 ml 19.44 ml/m LA Biplane Vol: 41.0 ml 21.96 ml/m   AORTA Ao Root diam: 3.00 cm Ao Asc  diam:  2.80 cm MITRAL VALVE MV Area (PHT): 4.17 cm    SHUNTS MV Decel Time: 182 msec    Systemic Diam: 1.80 cm MV E velocity: 57.20 cm/s MV A velocity: 69.10 cm/s MV E/A ratio:  0.83 Fransico Him MD Electronically signed by Fransico Him MD Signature Date/Time: 03/01/2020/1:32:53 PM  Final    US Abdomen Limited RUQ  Result Date: 03/01/2020 CLINICAL DATA:  54 year old female with elevated LFTs. EXAM: ULTRASOUND ABDOMEN LIMITED RIGHT UPPER QUADRANT COMPARISON:  Right upper quadrant ultrasound dated 08/29/2012. FINDINGS: Gallbladder: There is a 15 mm stone in the gallbladder. No gallbladder wall thickening or pericholecystic fluid. Reported positive sonographic Murphy's sign. Common bile duct: Diameter: 7 mm Liver: No focal lesion identified. Within normal limits in parenchymal echogenicity. Portal vein is patent on color Doppler imaging with normal direction of blood flow towards the liver. Other: None. IMPRESSION: Cholelithiasis with equivocal sonographic findings for acute cholecystitis. A hepatobiliary scintigraphy may provide better evaluation of the gallbladder if there is a high clinical concern for acute cholecystitis . Electronically Signed   By: Anner Crete M.D.   On: 03/01/2020 20:06    Cardiac Studies:  Assessment/Plan:  Hypertensive heart disease with diastolic dysfunction and mid cavitary Hypertrophic cardiomyopathy with no evidence of systolic anterior motion of mitral valve or significant mitral regurgitation Status post code 19.  Pneumonia. Interstitial lung disease. SLE History of PE. Transverse myelitis. History of CVA. Blindness. Central neuropathic pain Plan Increase Cardizem to 60 mg every 8 hours No acute active cardiac issues at this point I will sign off please call if needed follow-up with me in 2 weeks  LOS: 10 days    Charolette Forward 03/03/2020, 10:24 AM

## 2020-03-03 NOTE — Progress Notes (Signed)
Physical Therapy Treatment Patient Details Name: Andrea Berry MRN: [id number] DOB: 1965-10-05 Today's Date: 03/03/2020    History of Present Illness 54 y.o. female presented to ED 02/21/20 with shortness of breath 2/2 recent Covid infection (5/27) with hospitalization 5/27-6/1. +cardiomyopathy   PMH is significant for neuromyelitis optica/legal blindness, SLE, chronic interstitial lung disease,hx of PE, chronic central neuropathic pain, mild cognitive impairment, mood disorder, steroid-induced osteopenia, migraine without aura, HTN, CVA, transverse myelitis with chronic LUE, LLE weakness    PT Comments    Patient requires incr time due to dyspnea with RR max 50 during activity with sats down to 86% on 4L. (Pt reports she uses 3L at home). She required ~ 45 minutes to get OOB, walk 20 ft (then became incontinent of stool), sit and recover on BSC, stand twice for pericare, seated rest again and then walk 10 ft back to bed. (She refuses sitting in recliner due to back pain, and agreed to chair position in bed).   We discussed discharge plan and she insists her family could assist her as needed, including the amount of assist she needed today. She is refusing SNF (which would be my recommendation and reasons for this explained). If family can provide 24/7 min assist, then she wants to go home.    Follow Up Recommendations  Home health PT (pt currently refusing SNf and states family can assist her)     Equipment Recommendations  None recommended by PT    Recommendations for Other Services       Precautions / Restrictions Precautions Precautions: Fall Precaution Comments: legally blind. Monitor RR, HR, and O2    Mobility  Bed Mobility Overal bed mobility: Needs Assistance Bed Mobility: Supine to Sit;Sit to Supine     Supine to sit: Modified independent (Device/Increase time);HOB elevated Sit to supine: Modified independent (Device/Increase time)   General bed mobility comments: pt  requesting HOB elevated as I was lowering HOB; she acknowledged she does not have an adjustable bed at home and proceeded to sit up prior to [facility] flat  Transfers Overall transfer level: Needs assistance Equipment used: Rolling walker (2 wheeled) Transfers: Sit to/from Stand Sit to Stand: Min guard         General transfer comment: repeated x 3; pt prefers one hand on RW (due to vision and painful IV) with other hand pushing up from bed; denied dizziness  Ambulation/Gait Ambulation/Gait assistance: Min guard Gait Distance (Feet): 20 Feet (urgently needed BSC due to loose stool; another 10 ft to bed) Assistive device: Rolling walker (2 wheeled) Gait Pattern/deviations: Step-to pattern;Decreased stride length;Step-through pattern;Trunk flexed Gait velocity: decreased   General Gait Details: on 4L with sats as low as 86% and RR 44; responds well to cues for PLB, however soon reverts to very shallow breaths and requires cues again   Stairs             Wheelchair Mobility    Modified Rankin (Stroke Patients Only)       Balance Overall balance assessment: Needs assistance Sitting-balance support: No upper extremity supported;Feet unsupported Sitting balance-Leahy Scale: Fair     Standing balance support: Bilateral upper extremity supported Standing balance-Leahy Scale: Poor Standing balance comment: needs external support in standing.                             Cognition Arousal/Alertness: Awake/alert Behavior During Therapy: Flat affect Overall Cognitive Status: Within Functional Limits for tasks assessed  Exercises Other Exercises Other Exercises: supine-ankle pumps x 10, heelslides x 5 prior to [place] comments (skin integrity, edema, etc.): on arrival on 2L with sats 87-90% while talking with RR mid-30s. Prior to initiating activity, reviewed PLB and with max cues and  demonstration pt able to decr RR to 22 and sats up to 94%. Throughout session needed max cues to use PLB. While walking, sudden urge to use commode with pt incontinent of small amount while bringing BSC to her. Refuses sitting in recliner due to back pain. Agreed to chair position at end of session      Pertinent Vitals/Pain Pain Assessment: No/denies pain    Home Living                      Prior Function            PT Goals (current goals can now be found in the care plan section) Acute Rehab PT Goals Patient Stated Goal: to get better and get back home Time For Goal Achievement: 03/07/20 Potential to Achieve Goals: Good Progress towards PT goals: Progressing toward goals    Frequency    Min 3X/week      PT Plan Discharge plan needs to be updated    Co-evaluation              AM-PAC PT "6 Clicks" Mobility   Outcome Measure  Help needed turning from your back to your side while in a flat bed without using bedrails?: A Little Help needed moving from lying on your back to sitting on the side of a flat bed without using bedrails?: A Little Help needed moving to and from a bed to a chair (including a wheelchair)?: A Little Help needed standing up from a chair using your arms (e.g., wheelchair or bedside chair)?: A Little Help needed to walk in hospital room?: A Little Help needed climbing 3-5 steps with a railing? : A Lot 6 Click Score: 17    End of Session Equipment Utilized During Treatment: Oxygen Activity Tolerance: Patient limited by fatigue;Treatment limited secondary to medical complications (Comment) (RR and sats) Patient left: in bed;Other (comment) (chair position-refusing recliner) Nurse Communication: Mobility status;Other (comment) (at least utilize chair position if refusing OOB) PT Visit Diagnosis: Muscle weakness (generalized) (M62.81);Difficulty in walking, not elsewhere classified (R26.2)     Time: 9833-8250 PT Time Calculation (min)  (ACUTE ONLY): 56 min  Charges:  $Gait Training: 23-37 mins $Therapeutic Exercise: 8-22 mins $Self Care/Home Management: 8-22                      Arby Barrette, PT Pager [phone]    Rexanne Mano 03/03/2020, 2:01 PM

## 2020-03-03 NOTE — Progress Notes (Addendum)
Messaged family medicine about patient's very poor intake and very minimal output as well as concern for increase in cardizem.  Resident agreed that patient has improved and stable enough to go home with home health.  Grafton Folk, day Agricultural consultant if the discharge order needed to be co-signed before patient was discharged and was told no since the order is still needing to be co-signed.

## 2020-03-04 LAB — ANTI-DNA ANTIBODY, DOUBLE-STRANDED: ds DNA Ab: <1 IU/mL (ref 0–9)

## 2020-03-04 LAB — PATHOLOGIST SMEAR REVIEW

## 2020-03-04 NOTE — Progress Notes (Signed)
Patient currently leaving with [facility].  All belongings sent with patient.

## 2020-03-09 ENCOUNTER — Other Ambulatory Visit: Payer: Self-pay | Admitting: Family Medicine

## 2020-03-09 DIAGNOSIS — G894 Chronic pain syndrome: Secondary | ICD-10-CM

## 2020-03-13 ENCOUNTER — Other Ambulatory Visit: Payer: Self-pay | Admitting: Allergy

## 2020-04-06 ENCOUNTER — Ambulatory Visit
Admission: RE | Admit: 2020-04-06 | Discharge: 2020-04-06 | Disposition: A | Discharge status: Home / Self Care | Payer: Medicare Other | Source: Ambulatory Visit | Attending: Family Medicine | Admitting: Family Medicine

## 2020-04-06 ENCOUNTER — Other Ambulatory Visit: Payer: Self-pay

## 2020-04-06 DIAGNOSIS — Z1231 Encounter for screening mammogram for malignant neoplasm of breast: Secondary | ICD-10-CM

## 2020-04-07 ENCOUNTER — Telehealth (INDEPENDENT_AMBULATORY_CARE_PROVIDER_SITE_OTHER): Payer: Medicare Other | Admitting: Family Medicine

## 2020-04-07 ENCOUNTER — Other Ambulatory Visit: Payer: Self-pay

## 2020-04-07 ENCOUNTER — Encounter: Payer: Self-pay | Admitting: Family Medicine

## 2020-04-07 DIAGNOSIS — I422 Other hypertrophic cardiomyopathy: Secondary | ICD-10-CM | POA: Diagnosis not present

## 2020-04-07 DIAGNOSIS — R112 Nausea with vomiting, unspecified: Secondary | ICD-10-CM

## 2020-04-07 MED ORDER — PROMETHAZINE HCL 12.5 MG PO TABS: 12.5000 mg | ORAL_TABLET | Freq: Three times a day (TID) | ORAL | 1 refills | Status: DC | PRN

## 2020-04-07 MED ORDER — DILTIAZEM HCL ER COATED BEADS 180 MG PO CP24: 180.0000 mg | ORAL_CAPSULE | Freq: Every day | ORAL | 3 refills | Status: DC

## 2020-04-07 NOTE — Progress Notes (Addendum)
CPT E&M Office Visit Time Before Visit; reviewing medical records (e.g. recent visits, labs, studies):  minutes During Visit (F2F time): 21 minutes After Visit (discussion with family or HCP, prescribing, ordering, referring, calling result/recommendations or documenting on same day): 5 minutes Total Visit Time: 56minutes

## 2020-04-27 DIAGNOSIS — M7062 Trochanteric bursitis, left hip: Secondary | ICD-10-CM

## 2020-04-27 DIAGNOSIS — M7918 Myalgia, other site: Secondary | ICD-10-CM | POA: Insufficient documentation

## 2020-04-27 HISTORY — DX: Trochanteric bursitis, left hip: M70.62

## 2020-05-06 LAB — HEMOGLOBIN A1C: Hemoglobin A1C: 5.1

## 2020-05-11 ENCOUNTER — Telehealth: Payer: Self-pay | Admitting: Family Medicine

## 2020-05-11 NOTE — Telephone Encounter (Signed)
Clinical info completed on transportation form.  Place form in Dr. McDiarmid's box for completion.  Beula Joyner, CMA

## 2020-05-11 NOTE — Telephone Encounter (Signed)
Medicaid transportation  form dropped off for at front desk for completion.  Verified that patient section of form has been completed.  Last DOS/WCC with PCP was 04/07/20.  Placed form in team folder to be completed by clinical staff.  Andrea Berry

## 2020-05-12 ENCOUNTER — Ambulatory Visit: Payer: Medicare Other | Admitting: Family Medicine

## 2020-05-17 ENCOUNTER — Encounter: Payer: Self-pay | Admitting: Family Medicine

## 2020-05-17 LAB — ANKLE / BRACHIAL INDICES EXTREMITY COMPLETE
Immediate ABI left: NORMAL
Immediate ABI right: NORMAL

## 2020-05-17 NOTE — Progress Notes (Signed)
Medicaid Transportation Exception Verification form completed and given to staff to mail to [place]

## 2020-05-17 NOTE — Telephone Encounter (Signed)
Patient called and informed that forms are ready for pick up. Copy made and placed in batch scanning. Faxed ti [phone]. Original placed at front desk for pick up.   Talbot Grumbling, RN

## 2020-05-17 NOTE — Progress Notes (Signed)
Information from Visiting FNP ABI bilateral normal  MiniCog normal

## 2020-05-26 ENCOUNTER — Other Ambulatory Visit: Payer: Self-pay

## 2020-05-26 ENCOUNTER — Ambulatory Visit (INDEPENDENT_AMBULATORY_CARE_PROVIDER_SITE_OTHER): Payer: Medicare Other | Admitting: Family Medicine

## 2020-05-26 ENCOUNTER — Encounter: Payer: Self-pay | Admitting: Family Medicine

## 2020-05-26 VITALS — BP 126/74 | HR 82 | Ht 61.0 in | Wt 170.0 lb

## 2020-05-26 DIAGNOSIS — D6862 Lupus anticoagulant syndrome: Secondary | ICD-10-CM | POA: Diagnosis not present

## 2020-05-26 DIAGNOSIS — I422 Other hypertrophic cardiomyopathy: Secondary | ICD-10-CM

## 2020-05-26 DIAGNOSIS — G8194 Hemiplegia, unspecified affecting left nondominant side: Secondary | ICD-10-CM

## 2020-05-26 DIAGNOSIS — Z23 Encounter for immunization: Secondary | ICD-10-CM | POA: Diagnosis not present

## 2020-05-26 DIAGNOSIS — D638 Anemia in other chronic diseases classified elsewhere: Secondary | ICD-10-CM

## 2020-05-26 DIAGNOSIS — F39 Unspecified mood [affective] disorder: Secondary | ICD-10-CM | POA: Diagnosis not present

## 2020-05-26 DIAGNOSIS — D509 Iron deficiency anemia, unspecified: Secondary | ICD-10-CM

## 2020-05-26 DIAGNOSIS — J9611 Chronic respiratory failure with hypoxia: Secondary | ICD-10-CM

## 2020-05-26 DIAGNOSIS — Z9225 Personal history of immunosupression therapy: Secondary | ICD-10-CM

## 2020-05-26 DIAGNOSIS — Z79899 Other long term (current) drug therapy: Secondary | ICD-10-CM

## 2020-05-26 DIAGNOSIS — G894 Chronic pain syndrome: Secondary | ICD-10-CM

## 2020-05-26 HISTORY — DX: Hemiplegia, unspecified affecting left nondominant side: G81.94

## 2020-05-26 NOTE — Patient Instructions (Signed)
Please do continue your diltiazem and Trilepta and lorazepam.  They do not interfere with your heart condition.    If you want a phone visit, let me know using MyChart.

## 2020-05-27 LAB — ANEMIA PANEL
Ferritin: 122 ng/mL (ref 15–150)
Folate, Hemolysate: 206 ng/mL
Folate, RBC: 628 ng/mL (ref >498)
Hematocrit: 32.8 % — ABNORMAL LOW (ref 34.0–46.6)
Iron Saturation: 10 % — ABNORMAL LOW (ref 15–55)
Iron: 29 ug/dL (ref 27–159)
Retic Ct Pct: 1 % (ref 0.6–2.6)
Total Iron Binding Capacity: 290 ug/dL (ref 250–450)
UIBC: 261 ug/dL (ref 131–425)
Vitamin B-12: 284 pg/mL (ref 232–1245)

## 2020-05-31 ENCOUNTER — Encounter: Payer: Self-pay | Admitting: Family Medicine

## 2020-05-31 NOTE — Assessment & Plan Note (Signed)
Established problem. Stable. Continue current medications and other regiments. PDMP review did not show evidence of doctor shopping. Continue Duloxetine and Lorazepam

## 2020-05-31 NOTE — Addendum Note (Signed)
Addended byWendy Poet, Rozalyn Osland D on: 05/31/2020 08:09 AM   Modules accepted: Level of Service

## 2020-05-31 NOTE — Assessment & Plan Note (Signed)
Established problem. Stable. Continue current medications and other regiments. It is alright to use Trilepta and Lorazepam with Hypertrophic Cardiomyopathy

## 2020-05-31 NOTE — Assessment & Plan Note (Signed)
Established problem that has improved.  Lab Results  Component Value Date   HCT 32.8 (L) 05/26/2020

## 2020-05-31 NOTE — Assessment & Plan Note (Signed)
Established problem. Stable. Recent requalification for Home O2 at [facility] pulmonary department.  Continue current medications and other regiments.

## 2020-05-31 NOTE — Assessment & Plan Note (Signed)
Established problem Third CoViD-19 vaccination booster given due to Ms Ayo' immunosuppression of antibody response from Rituxamab.  Advised that should Ms Detert be exposed to diagnosed case of CoViD, she should let our office know so prophylactic antibody therapy may be given.

## 2020-05-31 NOTE — Assessment & Plan Note (Signed)
Established problem. Stable. Continue Diltiazem ER 180 mg Daily.  Follow up with Dr Terrence Dupont (Card)

## 2020-05-31 NOTE — Progress Notes (Signed)
Andrea Berry is accompanied by April Herring Sources of clinical information for visit is/are patient, relative(s), past medical records and DC summary. Nursing assessment for this office visit was reviewed with the patient for accuracy and revision.   Previous Report(s) Reviewed: historical medical records, lab reports, office notes and TTE reports  Depression screen [facility] 2/9 05/26/2020  Decreased Interest 0  Down, Depressed, Hopeless 0  PHQ - 2 Score 0  Altered sleeping -  Tired, decreased energy -  Change in appetite -  Feeling bad or failure about yourself  -  Trouble concentrating -  Moving slowly or fidgety/restless -  Suicidal thoughts -  PHQ-9 Score -  Difficult doing work/chores -    Fall Risk  05/26/2020 07/23/2019 03/27/2019 07/16/2018 07/16/2018  Falls in the past year? 0 1 0 0 0  Number falls in past yr: 0 0 0 - -  Injury with Fall? - 0 - - -  Risk for fall due to : Impaired vision - - Impaired vision;Medication side effect;Impaired mobility -  Risk for fall due to: Comment - - - - -    PHQ9 SCORE ONLY 05/26/2020 07/23/2019 03/27/2019  PHQ-9 Total Score 0 4 0    Adult vaccines due  Topic Date Due  . TETANUS/TDAP  04/02/2029  . PNEUMOCOCCAL POLYSACCHARIDE VACCINE AGE 39-64 HIGH RISK  Completed    Health Maintenance Due  Topic Date Due  . PAP SMEAR-Modifier  04/30/2020      History/P.E. limitations: none  Adult vaccines due  Topic Date Due  . TETANUS/TDAP  04/02/2029  . PNEUMOCOCCAL POLYSACCHARIDE VACCINE AGE 39-64 HIGH RISK  Completed   There are no preventive care reminders to display for this patient.  Health Maintenance Due  Topic Date Due  . PAP SMEAR-Modifier  04/30/2020     Chief Complaint  Patient presents with  . Follow-up    medication management    Visit Problem List with A/P  Personal history of immunosupression therapy Established problem Third CoViD-19 vaccination booster given due to Ms Moustafa' immunosuppression of antibody  response from Rituxamab.  Advised that should Ms [place]oks be exposed to diagnosed case of CoViD, she should let our office know so prophylactic antibody therapy may be given.   Mood disorder ([place]) Established problem. Stable. Continue current medications and other regiments. PDMP review did not show evidence of doctor shopping. Continue Duloxetine and Lorazepam     Chronic respiratory failure with hypoxia ([place]) Established problem. Stable. Recent requalification for Home O2 at [facility] pulmonary department.  Continue current medications and other regiments.   Anemia of chronic disease Established problem that has improved.  Lab Results  Component Value Date   HCT 32.8 (L) 05/26/2020      Chronic pain syndrome Established problem. Stable. Continue current medications and other regiments. It is alright to use Trilepta and Lorazepam with Hypertrophic Cardiomyopathy   Hypertrophic cardiomyopathy ([place]) Established problem. Stable. Continue Diltiazem ER 180 mg Daily.  Follow up with Dr Terrence Dupont (Card)

## 2020-06-14 ENCOUNTER — Other Ambulatory Visit: Payer: Self-pay | Admitting: Dermatology

## 2020-06-15 ENCOUNTER — Telehealth: Payer: Self-pay

## 2020-06-15 NOTE — Telephone Encounter (Signed)
April calls nurse line stating the medicaid transportation forms were never received. April gave me a new fax number for them, [phone]. I have printed the forms and have refaxed to new number. April has been up dated.

## 2020-06-23 ENCOUNTER — Other Ambulatory Visit: Payer: Self-pay | Admitting: Family Medicine

## 2020-06-23 DIAGNOSIS — F419 Anxiety disorder, unspecified: Secondary | ICD-10-CM

## 2020-06-27 ENCOUNTER — Other Ambulatory Visit: Payer: Self-pay | Admitting: Family Medicine

## 2020-06-27 DIAGNOSIS — D6862 Lupus anticoagulant syndrome: Secondary | ICD-10-CM

## 2020-06-27 DIAGNOSIS — Z86711 Personal history of pulmonary embolism: Secondary | ICD-10-CM

## 2020-06-28 ENCOUNTER — Other Ambulatory Visit: Payer: Self-pay

## 2020-06-28 ENCOUNTER — Ambulatory Visit (INDEPENDENT_AMBULATORY_CARE_PROVIDER_SITE_OTHER): Payer: Medicare Other

## 2020-06-28 DIAGNOSIS — Z23 Encounter for immunization: Secondary | ICD-10-CM | POA: Diagnosis present

## 2020-06-28 NOTE — Progress Notes (Signed)
° °  Covid-19 Vaccination Clinic  Name:  Andrea Berry    MRN: [id number] DOB: 08/20/1966  06/28/2020  [name] was observed post Covid-19 immunization for 15 minutes without incident. She was provided with Vaccine Information Sheet and instruction to access the V-Safe system.   [name] was instructed to call 911 with any severe reactions post vaccine:  Difficulty breathing   Swelling of face and throat   A fast heartbeat   A bad rash all over body   Dizziness and weakness   #3 Covid Vaccine administered RD without complication.

## 2020-09-13 ENCOUNTER — Other Ambulatory Visit: Payer: Self-pay | Admitting: Family Medicine

## 2020-09-13 ENCOUNTER — Other Ambulatory Visit: Payer: Self-pay | Admitting: Allergy

## 2020-09-13 DIAGNOSIS — G894 Chronic pain syndrome: Secondary | ICD-10-CM

## 2020-09-13 DIAGNOSIS — F418 Other specified anxiety disorders: Secondary | ICD-10-CM

## 2020-09-13 DIAGNOSIS — F419 Anxiety disorder, unspecified: Secondary | ICD-10-CM

## 2020-09-13 DIAGNOSIS — G8929 Other chronic pain: Secondary | ICD-10-CM

## 2020-09-13 DIAGNOSIS — M792 Neuralgia and neuritis, unspecified: Secondary | ICD-10-CM

## 2020-09-16 ENCOUNTER — Telehealth (INDEPENDENT_AMBULATORY_CARE_PROVIDER_SITE_OTHER): Payer: Medicare Other | Admitting: Family Medicine

## 2020-09-16 ENCOUNTER — Other Ambulatory Visit: Payer: Self-pay

## 2020-09-16 DIAGNOSIS — J069 Acute upper respiratory infection, unspecified: Secondary | ICD-10-CM | POA: Diagnosis not present

## 2020-09-16 MED ORDER — BENZONATATE 100 MG PO CAPS: 100.0000 mg | ORAL_CAPSULE | Freq: Two times a day (BID) | ORAL | 0 refills | Status: DC | PRN

## 2020-09-16 NOTE — Assessment & Plan Note (Signed)
Patient with signs and symptoms consistent with a viral URI for the past week.  Patient has been vaccinated for COVID-19 and has had COVID-19.  Symptoms have been present for 7 days.  They have been improving but not yet resolved.  She is also had some nausea which she takes Phenergan for and this helps.  Discussed supportive care such as hot tea with honey, Tylenol or ibuprofen for fever and discomfort.  Prescription sent for Ladona Ridgel to patient's pharmacy.  Strict ED precautions given.  Patient is understandable.  If the symptoms do not resolve over the next week or so patient can schedule an in person appointment.

## 2020-09-16 NOTE — Progress Notes (Signed)
[place] Telemedicine Visit  Patient consented to have virtual visit and was identified by name and date of birth. Method of visit: Telephone  Encounter participants: Patient: Andrea Berry - located in her car  Provider: Gifford Shave - located at [facility]   Chief Complaint: Cough, congestion  HPI: Patient reports that she has had a cough, congestion, runny nose for the 1 week.  She is also had nausea over the past few days.  She takes Phenergan for the nausea which she reports helps.  She has been taking TheraFlu to help with the cough and is not seeing a whole lot of benefit from this.  Patient has been vaccinated against Covid and received a booster.  Patient also had Covid in June 2021.  Her O2 sats have been running in the 90s most of the time and she uses 1 L O2 as needed to help with shortness of breath.  She has not tried any other home medications to help with the symptoms.   ROS: per HPI  Pertinent PMHx: [id number]  Exam:  There were no vitals taken for this visit.  Respiratory: Nonlabored work of breathing, speaking in complete sentences, sounds mildly congested  Assessment/Plan:  Viral URI with cough Patient with signs and symptoms consistent with a viral URI for the past week.  Patient has been vaccinated for COVID-19 and has had COVID-19.  Symptoms have been present for 7 days.  They have been improving but not yet resolved.  She is also had some nausea which she takes Phenergan for and this helps.  Discussed supportive care such as hot tea with honey, Tylenol or ibuprofen for fever and discomfort.  Prescription sent for Ladona Ridgel to patient's pharmacy.  Strict ED precautions given.  Patient is understandable.  If the symptoms do not resolve over the next week or so patient can schedule an in person appointment.    Time spent during visit with patient: 2  minutes

## 2020-09-20 ENCOUNTER — Other Ambulatory Visit: Payer: Self-pay | Admitting: Family Medicine

## 2020-09-20 DIAGNOSIS — G894 Chronic pain syndrome: Secondary | ICD-10-CM

## 2020-10-04 ENCOUNTER — Other Ambulatory Visit: Payer: Self-pay | Admitting: Family Medicine

## 2020-10-04 ENCOUNTER — Encounter: Payer: Self-pay | Admitting: Family Medicine

## 2020-10-04 DIAGNOSIS — J849 Interstitial pulmonary disease, unspecified: Secondary | ICD-10-CM

## 2020-10-04 DIAGNOSIS — G4733 Obstructive sleep apnea (adult) (pediatric): Secondary | ICD-10-CM

## 2020-10-04 DIAGNOSIS — Z7952 Long term (current) use of systemic steroids: Secondary | ICD-10-CM

## 2020-10-04 DIAGNOSIS — M5416 Radiculopathy, lumbar region: Secondary | ICD-10-CM | POA: Insufficient documentation

## 2020-10-04 DIAGNOSIS — D808 Other immunodeficiencies with predominantly antibody defects: Secondary | ICD-10-CM

## 2020-10-04 HISTORY — DX: Obstructive sleep apnea (adult) (pediatric): G47.33

## 2020-10-04 NOTE — Assessment & Plan Note (Signed)
Likely rituximab therapy induced B-cell function suppression.  Imparied immunoglobin response to vaccinations.

## 2020-10-17 ENCOUNTER — Other Ambulatory Visit: Payer: Self-pay | Admitting: Family Medicine

## 2020-10-17 DIAGNOSIS — D6862 Lupus anticoagulant syndrome: Secondary | ICD-10-CM

## 2020-10-17 DIAGNOSIS — Z86711 Personal history of pulmonary embolism: Secondary | ICD-10-CM

## 2020-10-20 ENCOUNTER — Encounter: Payer: Self-pay | Admitting: Family Medicine

## 2020-10-20 ENCOUNTER — Other Ambulatory Visit: Payer: Self-pay

## 2020-10-20 ENCOUNTER — Ambulatory Visit (INDEPENDENT_AMBULATORY_CARE_PROVIDER_SITE_OTHER): Payer: Medicare Other | Admitting: Family Medicine

## 2020-10-20 VITALS — BP 120/60 | HR 94 | Ht 61.0 in | Wt 177.4 lb

## 2020-10-20 DIAGNOSIS — D638 Anemia in other chronic diseases classified elsewhere: Secondary | ICD-10-CM | POA: Diagnosis not present

## 2020-10-20 DIAGNOSIS — D509 Iron deficiency anemia, unspecified: Secondary | ICD-10-CM

## 2020-10-20 DIAGNOSIS — D6862 Lupus anticoagulant syndrome: Secondary | ICD-10-CM

## 2020-10-20 DIAGNOSIS — Z780 Asymptomatic menopausal state: Secondary | ICD-10-CM

## 2020-10-20 DIAGNOSIS — J310 Chronic rhinitis: Secondary | ICD-10-CM

## 2020-10-20 DIAGNOSIS — K219 Gastro-esophageal reflux disease without esophagitis: Secondary | ICD-10-CM | POA: Diagnosis not present

## 2020-10-20 DIAGNOSIS — R7401 Elevation of levels of liver transaminase levels: Secondary | ICD-10-CM | POA: Diagnosis not present

## 2020-10-20 DIAGNOSIS — F39 Unspecified mood [affective] disorder: Secondary | ICD-10-CM

## 2020-10-20 DIAGNOSIS — L819 Disorder of pigmentation, unspecified: Secondary | ICD-10-CM

## 2020-10-20 DIAGNOSIS — Z86711 Personal history of pulmonary embolism: Secondary | ICD-10-CM

## 2020-10-20 DIAGNOSIS — I422 Other hypertrophic cardiomyopathy: Secondary | ICD-10-CM

## 2020-10-20 DIAGNOSIS — Z7952 Long term (current) use of systemic steroids: Secondary | ICD-10-CM

## 2020-10-20 DIAGNOSIS — K5900 Constipation, unspecified: Secondary | ICD-10-CM

## 2020-10-20 MED ORDER — ESOMEPRAZOLE MAGNESIUM 20 MG PO CPDR: 20.0000 mg | DELAYED_RELEASE_CAPSULE | Freq: Every day | ORAL | 3 refills | Status: DC

## 2020-10-20 NOTE — Patient Instructions (Addendum)
We are looking into some possible reasons for your anemia with lab tests. If you have labs (blood work) drawn today and your tests are completely normal, you will receive your results only by:  [place] (if you have MyChart) OR  A paper copy in the mail If you have any lab test that is abnormal or we need to change your treatment, we will call you to review the results.  Please contact Dr Arrie Aran, gastroenterologist, to have your surveillance colonoscopy performed.  The kind of polyp he removed in 2018 are ones that tend to change to cancer, but if they are removed early they don't  Please stop the Lyrica.  It turns into gabapentin in your body, so you are taking the same medication twice.  Continue the gabapentin.

## 2020-10-21 ENCOUNTER — Encounter: Payer: Self-pay | Admitting: Family Medicine

## 2020-10-21 DIAGNOSIS — Z780 Asymptomatic menopausal state: Secondary | ICD-10-CM

## 2020-10-21 DIAGNOSIS — R7401 Elevation of levels of liver transaminase levels: Secondary | ICD-10-CM

## 2020-10-21 HISTORY — DX: Asymptomatic menopausal state: Z78.0

## 2020-10-21 HISTORY — DX: Elevation of levels of liver transaminase levels: R74.01

## 2020-10-21 NOTE — Assessment & Plan Note (Signed)
Established problem Well Controlled. No signs of complications, medication side effects, or red flags. Continue current medications and other regiments.  

## 2020-10-21 NOTE — Assessment & Plan Note (Signed)
Occurred during CoViD infection. Recheck lfts today to monitor for return to normal  Pt with RUQ [place] 02/2020

## 2020-10-21 NOTE — Assessment & Plan Note (Signed)
Recent visit with Dr Terrence Dupont No visit report in Epic Pt reports no changes in tx regiment by Dr Terrence Dupont Need ROI

## 2020-10-21 NOTE — Assessment & Plan Note (Signed)
Established problem Well Controlled. No signs of complications, medication side effects, or red flags. Continue current medications Astelin and Flonse Atrovent nasal was ineffective

## 2020-10-21 NOTE — Progress Notes (Signed)
Andrea Berry is accompanied by [name] Sources of clinical information for visit is/are patient and past medical records. Nursing assessment for this office visit was reviewed with the patient for accuracy and revision.     Previous Report(s) Reviewed: lab reports, office notes and Rheum consult at [facility] with their labs and Sleep Study report  Depression screen [facility] 2/9 10/20/2020  Decreased Interest 1  Down, Depressed, Hopeless 0  PHQ - 2 Score 1  Altered sleeping 3  Tired, decreased energy 1  Change in appetite 2  Feeling bad or failure about yourself  1  Trouble concentrating 0  Moving slowly or fidgety/restless 0  Suicidal thoughts 0  PHQ-9 Score 8  Difficult doing work/chores Not difficult at all    Fall Risk  05/26/2020 07/23/2019 03/27/2019 07/16/2018 07/16/2018  Falls in the past year? 0 1 0 0 0  Number falls in past yr: 0 0 0 - -  Injury with Fall? - 0 - - -  Risk for fall due to : Impaired vision - - Impaired vision;Medication side effect;Impaired mobility -  Risk for fall due to: Comment - - - - -    PHQ9 SCORE ONLY 10/20/2020 05/26/2020 07/23/2019  PHQ-9 Total Score 8 0 4    Adult vaccines due  Topic Date Due  . TETANUS/TDAP  04/02/2029  . PNEUMOCOCCAL POLYSACCHARIDE VACCINE AGE 53-64 HIGH RISK  Completed    Health Maintenance Due  Topic Date Due  . PAP SMEAR-Modifier  04/30/2020      History/P.E. limitations: none  Adult vaccines due  Topic Date Due  . TETANUS/TDAP  04/02/2029  . PNEUMOCOCCAL POLYSACCHARIDE VACCINE AGE 53-64 HIGH RISK  Completed   There are no preventive care reminders to display for this patient.  Health Maintenance Due  Topic Date Due  . PAP SMEAR-Modifier  04/30/2020     Chief Complaint  Patient presents with  . Follow-up    Blood work

## 2020-10-21 NOTE — Assessment & Plan Note (Signed)
Pigmented lesion over old central catheter port site has resolved.

## 2020-10-21 NOTE — Assessment & Plan Note (Signed)
Established problem. Stable. CBC Latest Ref Rng & Units 10/20/2020 05/26/2020 03/03/2020  WBC 3.4 - 10.8 x10E3/uL 5.0 - 5.5  Hemoglobin 11.1 - 15.9 g/dL 11.2 - 8.8(L)  Hematocrit 34.0 - 46.6 % 36.0 32.8(L) 29.5(L)  Platelets 150 - 450 x10E3/uL 366 - 381  Metzner Index in 13-14 range.  Lab Results  Component Value Date   FERRITIN 78 10/20/2020   Await Hgb electrophoresis to look for a minor beta-thalassemia

## 2020-10-24 LAB — CBC
Hematocrit: 36 % (ref 34.0–46.6)
Hemoglobin: 11.2 g/dL (ref 11.1–15.9)
MCH: 22 pg — ABNORMAL LOW (ref 26.6–33.0)
MCHC: 31.1 g/dL — ABNORMAL LOW (ref 31.5–35.7)
MCV: 71 fL — ABNORMAL LOW (ref 79–97)
Platelets: 366 10*3/uL (ref 150–450)
RBC: 5.08 x10E6/uL (ref 3.77–5.28)
RDW: 18.1 % — ABNORMAL HIGH (ref 11.7–15.4)
WBC: 5 10*3/uL (ref 3.4–10.8)

## 2020-10-24 LAB — CMP14+EGFR
ALT: 30 IU/L (ref 0–32)
AST: 32 IU/L (ref 0–40)
Albumin/Globulin Ratio: 2 (ref 1.2–2.2)
Albumin: 4.3 g/dL (ref 3.8–4.9)
Alkaline Phosphatase: 111 IU/L (ref 44–121)
BUN/Creatinine Ratio: 10 (ref 9–23)
BUN: 11 mg/dL (ref 6–24)
Bilirubin Total: <0.2 mg/dL (ref 0.0–1.2)
CO2: 21 mmol/L (ref 20–29)
Calcium: 9.6 mg/dL (ref 8.7–10.2)
Chloride: 103 mmol/L (ref 96–106)
Creatinine, Ser: 1.06 mg/dL — ABNORMAL HIGH (ref 0.57–1.00)
GFR calc Af Amer: 70 mL/min/{1.73_m2} (ref >59)
GFR calc non Af Amer: 61 mL/min/{1.73_m2} (ref >59)
Globulin, Total: 2.1 g/dL (ref 1.5–4.5)
Glucose: 92 mg/dL (ref 65–99)
Potassium: 3.9 mmol/L (ref 3.5–5.2)
Sodium: 141 mmol/L (ref 134–144)
Total Protein: 6.4 g/dL (ref 6.0–8.5)

## 2020-10-24 LAB — HGB FRACTIONATION CASCADE
Hgb A2: 2.3 % (ref 1.8–3.2)
Hgb A: 97.7 % (ref 96.4–98.8)
Hgb F: 0 % (ref 0.0–2.0)
Hgb S: 0 %

## 2020-10-24 LAB — FERRITIN: Ferritin: 78 ng/mL (ref 15–150)

## 2020-11-02 ENCOUNTER — Other Ambulatory Visit: Payer: Self-pay

## 2020-11-02 DIAGNOSIS — F419 Anxiety disorder, unspecified: Secondary | ICD-10-CM

## 2020-11-02 NOTE — Telephone Encounter (Signed)
Patient's care manager calls nurse line regarding lorazepam rx. Patient is needing a six week supply of medication sent to the pharmacy, due to going out of town.    Please advise.   Talbot Grumbling, RN

## 2020-11-03 MED ORDER — LORAZEPAM 0.5 MG PO TABS: 0.5000 mg | ORAL_TABLET | Freq: Two times a day (BID) | ORAL | 5 refills | Status: DC | PRN

## 2020-11-08 ENCOUNTER — Telehealth: Payer: Self-pay | Admitting: Family Medicine

## 2020-11-08 NOTE — Telephone Encounter (Signed)
Clinical info completed on [place] form.  Place form in PCP's box for completion.  Burleigh Brockmann, CMA  °

## 2020-11-08 NOTE — Telephone Encounter (Signed)
Access [place] form dropped off for at front desk for completion.  Verified that patient section of form has been completed.  Last DOS/WCC with PCP was 10/20/20.  Placed form in team folder to be completed by clinical staff.  Creig Hines

## 2020-11-09 ENCOUNTER — Other Ambulatory Visit: Payer: Self-pay | Admitting: Family Medicine

## 2020-11-09 DIAGNOSIS — K5900 Constipation, unspecified: Secondary | ICD-10-CM

## 2020-11-14 NOTE — Telephone Encounter (Signed)
[employer] logistics/support/administrative officer Verification form completed / signed, and placed in RN Triage box

## 2020-11-14 NOTE — Telephone Encounter (Signed)
Called number provided on form to discuss forms. Form is missing an applicant signature on Part B of form. LVM informing of missing signature.   Will place at front desk for patient to complete.   Talbot Grumbling, RN

## 2021-01-11 ENCOUNTER — Other Ambulatory Visit: Payer: Self-pay | Admitting: Family Medicine

## 2021-01-11 DIAGNOSIS — Z1231 Encounter for screening mammogram for malignant neoplasm of breast: Secondary | ICD-10-CM

## 2021-01-27 ENCOUNTER — Other Ambulatory Visit: Payer: Self-pay | Admitting: Family Medicine

## 2021-01-27 DIAGNOSIS — M8580 Other specified disorders of bone density and structure, unspecified site: Secondary | ICD-10-CM

## 2021-01-27 DIAGNOSIS — I422 Other hypertrophic cardiomyopathy: Secondary | ICD-10-CM

## 2021-03-05 ENCOUNTER — Emergency Department (HOSPITAL_COMMUNITY): Payer: Medicare Other

## 2021-03-05 ENCOUNTER — Inpatient Hospital Stay (HOSPITAL_COMMUNITY)
Admission: EM | Admit: 2021-03-05 | Discharge: 2021-03-07 | DRG: 312 | Disposition: A | Discharge status: Home / Self Care | Payer: Medicare Other | Attending: Family Medicine | Admitting: Family Medicine

## 2021-03-05 ENCOUNTER — Other Ambulatory Visit: Payer: Self-pay

## 2021-03-05 DIAGNOSIS — Z7952 Long term (current) use of systemic steroids: Secondary | ICD-10-CM

## 2021-03-05 DIAGNOSIS — G360 Neuromyelitis optica [Devic]: Secondary | ICD-10-CM | POA: Diagnosis present

## 2021-03-05 DIAGNOSIS — D509 Iron deficiency anemia, unspecified: Secondary | ICD-10-CM | POA: Diagnosis present

## 2021-03-05 DIAGNOSIS — Z8701 Personal history of pneumonia (recurrent): Secondary | ICD-10-CM

## 2021-03-05 DIAGNOSIS — M329 Systemic lupus erythematosus, unspecified: Secondary | ICD-10-CM | POA: Diagnosis present

## 2021-03-05 DIAGNOSIS — I422 Other hypertrophic cardiomyopathy: Secondary | ICD-10-CM

## 2021-03-05 DIAGNOSIS — G4733 Obstructive sleep apnea (adult) (pediatric): Secondary | ICD-10-CM | POA: Diagnosis present

## 2021-03-05 DIAGNOSIS — F419 Anxiety disorder, unspecified: Secondary | ICD-10-CM

## 2021-03-05 DIAGNOSIS — N179 Acute kidney failure, unspecified: Secondary | ICD-10-CM | POA: Diagnosis present

## 2021-03-05 DIAGNOSIS — D649 Anemia, unspecified: Secondary | ICD-10-CM | POA: Insufficient documentation

## 2021-03-05 DIAGNOSIS — G629 Polyneuropathy, unspecified: Secondary | ICD-10-CM | POA: Diagnosis present

## 2021-03-05 DIAGNOSIS — Z20822 Contact with and (suspected) exposure to covid-19: Secondary | ICD-10-CM | POA: Diagnosis present

## 2021-03-05 DIAGNOSIS — Z7901 Long term (current) use of anticoagulants: Secondary | ICD-10-CM

## 2021-03-05 DIAGNOSIS — Z8673 Personal history of transient ischemic attack (TIA), and cerebral infarction without residual deficits: Secondary | ICD-10-CM

## 2021-03-05 DIAGNOSIS — I951 Orthostatic hypotension: Principal | ICD-10-CM | POA: Diagnosis present

## 2021-03-05 DIAGNOSIS — H6983 Other specified disorders of Eustachian tube, bilateral: Secondary | ICD-10-CM | POA: Diagnosis present

## 2021-03-05 DIAGNOSIS — I421 Obstructive hypertrophic cardiomyopathy: Secondary | ICD-10-CM | POA: Diagnosis present

## 2021-03-05 DIAGNOSIS — F32A Depression, unspecified: Secondary | ICD-10-CM | POA: Diagnosis present

## 2021-03-05 DIAGNOSIS — K219 Gastro-esophageal reflux disease without esophagitis: Secondary | ICD-10-CM | POA: Diagnosis present

## 2021-03-05 DIAGNOSIS — J9611 Chronic respiratory failure with hypoxia: Secondary | ICD-10-CM | POA: Diagnosis present

## 2021-03-05 DIAGNOSIS — Z881 Allergy status to other antibiotic agents status: Secondary | ICD-10-CM

## 2021-03-05 DIAGNOSIS — G4700 Insomnia, unspecified: Secondary | ICD-10-CM | POA: Diagnosis present

## 2021-03-05 DIAGNOSIS — Z8619 Personal history of other infectious and parasitic diseases: Secondary | ICD-10-CM | POA: Diagnosis not present

## 2021-03-05 DIAGNOSIS — Z832 Family history of diseases of the blood and blood-forming organs and certain disorders involving the immune mechanism: Secondary | ICD-10-CM

## 2021-03-05 DIAGNOSIS — Z86711 Personal history of pulmonary embolism: Secondary | ICD-10-CM | POA: Diagnosis not present

## 2021-03-05 DIAGNOSIS — Z8616 Personal history of COVID-19: Secondary | ICD-10-CM

## 2021-03-05 DIAGNOSIS — D6862 Lupus anticoagulant syndrome: Secondary | ICD-10-CM

## 2021-03-05 DIAGNOSIS — G894 Chronic pain syndrome: Secondary | ICD-10-CM | POA: Diagnosis present

## 2021-03-05 DIAGNOSIS — M8580 Other specified disorders of bone density and structure, unspecified site: Secondary | ICD-10-CM

## 2021-03-05 DIAGNOSIS — W1839XA Other fall on same level, initial encounter: Secondary | ICD-10-CM | POA: Diagnosis present

## 2021-03-05 DIAGNOSIS — F39 Unspecified mood [affective] disorder: Secondary | ICD-10-CM | POA: Diagnosis present

## 2021-03-05 DIAGNOSIS — J189 Pneumonia, unspecified organism: Secondary | ICD-10-CM

## 2021-03-05 DIAGNOSIS — Y92009 Unspecified place in unspecified non-institutional (private) residence as the place of occurrence of the external cause: Secondary | ICD-10-CM

## 2021-03-05 DIAGNOSIS — I10 Essential (primary) hypertension: Secondary | ICD-10-CM | POA: Diagnosis present

## 2021-03-05 DIAGNOSIS — Z87442 Personal history of urinary calculi: Secondary | ICD-10-CM

## 2021-03-05 DIAGNOSIS — R55 Syncope and collapse: Secondary | ICD-10-CM | POA: Diagnosis present

## 2021-03-05 DIAGNOSIS — K5900 Constipation, unspecified: Secondary | ICD-10-CM

## 2021-03-05 DIAGNOSIS — J849 Interstitial pulmonary disease, unspecified: Secondary | ICD-10-CM | POA: Diagnosis present

## 2021-03-05 DIAGNOSIS — T380X5A Adverse effect of glucocorticoids and synthetic analogues, initial encounter: Secondary | ICD-10-CM

## 2021-03-05 DIAGNOSIS — G8929 Other chronic pain: Secondary | ICD-10-CM

## 2021-03-05 DIAGNOSIS — Z888 Allergy status to other drugs, medicaments and biological substances status: Secondary | ICD-10-CM

## 2021-03-05 DIAGNOSIS — I351 Nonrheumatic aortic (valve) insufficiency: Secondary | ICD-10-CM | POA: Diagnosis not present

## 2021-03-05 DIAGNOSIS — M810 Age-related osteoporosis without current pathological fracture: Secondary | ICD-10-CM | POA: Diagnosis present

## 2021-03-05 DIAGNOSIS — H548 Legal blindness, as defined in USA: Secondary | ICD-10-CM | POA: Diagnosis present

## 2021-03-05 DIAGNOSIS — Z79899 Other long term (current) drug therapy: Secondary | ICD-10-CM

## 2021-03-05 DIAGNOSIS — F418 Other specified anxiety disorders: Secondary | ICD-10-CM

## 2021-03-05 LAB — COMPREHENSIVE METABOLIC PANEL
ALT: 13 U/L (ref 0–44)
AST: 20 U/L (ref 15–41)
Albumin: 2.9 g/dL — ABNORMAL LOW (ref 3.5–5.0)
Alkaline Phosphatase: 53 U/L (ref 38–126)
Anion gap: 6 (ref 5–15)
BUN: 10 mg/dL (ref 6–20)
CO2: 21 mmol/L — ABNORMAL LOW (ref 22–32)
Calcium: 8.2 mg/dL — ABNORMAL LOW (ref 8.9–10.3)
Chloride: 107 mmol/L (ref 98–111)
Creatinine, Ser: 1.27 mg/dL — ABNORMAL HIGH (ref 0.44–1.00)
GFR, Estimated: 51 mL/min — ABNORMAL LOW (ref >60)
Glucose, Bld: 119 mg/dL — ABNORMAL HIGH (ref 70–99)
Potassium: 4 mmol/L (ref 3.5–5.1)
Sodium: 134 mmol/L — ABNORMAL LOW (ref 135–145)
Total Bilirubin: 0.3 mg/dL (ref 0.3–1.2)
Total Protein: 5.2 g/dL — ABNORMAL LOW (ref 6.5–8.1)

## 2021-03-05 LAB — CBC WITH DIFFERENTIAL/PLATELET
Abs Immature Granulocytes: 0.06 10*3/uL (ref 0.00–0.07)
Basophils Absolute: 0 10*3/uL (ref 0.0–0.1)
Basophils Relative: 0 %
Eosinophils Absolute: 0 10*3/uL (ref 0.0–0.5)
Eosinophils Relative: 1 %
HCT: 22 % — ABNORMAL LOW (ref 36.0–46.0)
Hemoglobin: 6.3 g/dL — CL (ref 12.0–15.0)
Immature Granulocytes: 1 %
Lymphocytes Relative: 14 %
Lymphs Abs: 0.6 10*3/uL — ABNORMAL LOW (ref 0.7–4.0)
MCH: 21.4 pg — ABNORMAL LOW (ref 26.0–34.0)
MCHC: 28.6 g/dL — ABNORMAL LOW (ref 30.0–36.0)
MCV: 74.6 fL — ABNORMAL LOW (ref 80.0–100.0)
Monocytes Absolute: 0.8 10*3/uL (ref 0.1–1.0)
Monocytes Relative: 17 %
Neutro Abs: 2.9 10*3/uL (ref 1.7–7.7)
Neutrophils Relative %: 67 %
Platelets: 173 10*3/uL (ref 150–400)
RBC: 2.95 MIL/uL — ABNORMAL LOW (ref 3.87–5.11)
RDW: 16.4 % — ABNORMAL HIGH (ref 11.5–15.5)
WBC: 4.4 10*3/uL (ref 4.0–10.5)
nRBC: 0 % (ref 0.0–0.2)

## 2021-03-05 LAB — I-STAT BETA HCG BLOOD, ED (MC, WL, AP ONLY): I-stat hCG, quantitative: <5 m[IU]/mL (ref <5)

## 2021-03-05 LAB — PREPARE RBC (CROSSMATCH)

## 2021-03-05 LAB — POC OCCULT BLOOD, ED: Fecal Occult Bld: NEGATIVE

## 2021-03-05 LAB — CBG MONITORING, ED: Glucose-Capillary: 130 mg/dL — ABNORMAL HIGH (ref 70–99)

## 2021-03-05 MED ORDER — PREDNISONE 5 MG PO TABS
5.0000 mg | ORAL_TABLET | Freq: Every day | ORAL | Status: DC
  Administered 2021-03-06 – 2021-03-07 (×2): 5 mg via ORAL
  Filled 2021-03-05 (×2): qty 1

## 2021-03-05 MED ORDER — SODIUM CHLORIDE 0.9 % IV BOLUS
1000.0000 mL | Freq: Once | INTRAVENOUS | Status: AC
  Administered 2021-03-05: 1000 mL via INTRAVENOUS

## 2021-03-05 MED ORDER — AZELASTINE HCL 0.1 % NA SOLN
2.0000 | Freq: Two times a day (BID) | NASAL | Status: DC
  Administered 2021-03-07: 2 via NASAL
  Filled 2021-03-05: qty 30

## 2021-03-05 MED ORDER — SODIUM CHLORIDE 0.9 % IV SOLN: INTRAVENOUS | Status: DC

## 2021-03-05 MED ORDER — LEVOFLOXACIN IN D5W 750 MG/150ML IV SOLN
750.0000 mg | Freq: Once | INTRAVENOUS | Status: AC
  Administered 2021-03-06: 750 mg via INTRAVENOUS
  Filled 2021-03-05: qty 150

## 2021-03-05 MED ORDER — SODIUM CHLORIDE 0.9 % IV SOLN
10.0000 mL/h | Freq: Once | INTRAVENOUS | Status: AC
  Administered 2021-03-05: 10 mL/h via INTRAVENOUS

## 2021-03-05 MED ORDER — LORAZEPAM 0.5 MG PO TABS: 0.5000 mg | ORAL_TABLET | Freq: Every day | ORAL | Status: DC | PRN

## 2021-03-05 MED ORDER — LINACLOTIDE 145 MCG PO CAPS
145.0000 ug | ORAL_CAPSULE | Freq: Every day | ORAL | Status: DC
  Administered 2021-03-06 – 2021-03-07 (×2): 145 ug via ORAL
  Filled 2021-03-05 (×2): qty 1

## 2021-03-05 MED ORDER — TIZANIDINE HCL 2 MG PO TABS
4.0000 mg | ORAL_TABLET | Freq: Every evening | ORAL | Status: DC | PRN
  Administered 2021-03-06: 4 mg via ORAL
  Filled 2021-03-05: qty 1

## 2021-03-05 MED ORDER — PANTOPRAZOLE SODIUM 40 MG PO TBEC
40.0000 mg | DELAYED_RELEASE_TABLET | Freq: Every day | ORAL | Status: DC
  Administered 2021-03-06 – 2021-03-07 (×2): 40 mg via ORAL
  Filled 2021-03-05 (×2): qty 1

## 2021-03-05 MED ORDER — GABAPENTIN 300 MG PO CAPS
600.0000 mg | ORAL_CAPSULE | Freq: Three times a day (TID) | ORAL | Status: DC
  Administered 2021-03-06 – 2021-03-07 (×5): 600 mg via ORAL
  Filled 2021-03-05 (×5): qty 2

## 2021-03-05 MED ORDER — DULOXETINE HCL 60 MG PO CPEP
60.0000 mg | ORAL_CAPSULE | Freq: Every day | ORAL | Status: DC
  Administered 2021-03-06 – 2021-03-07 (×2): 60 mg via ORAL
  Filled 2021-03-05 (×2): qty 1

## 2021-03-05 MED ORDER — OXCARBAZEPINE 150 MG PO TABS
150.0000 mg | ORAL_TABLET | Freq: Every day | ORAL | Status: DC
  Administered 2021-03-06 – 2021-03-07 (×2): 150 mg via ORAL
  Filled 2021-03-05 (×2): qty 1

## 2021-03-05 MED ORDER — FLUTICASONE PROPIONATE 50 MCG/ACT NA SUSP: 1.0000 | Freq: Every day | NASAL | Status: DC | PRN

## 2021-03-05 NOTE — H&P (Addendum)
[facility] Admission History and Physical Service Pager: [phone]  Patient name: Andrea Berry Medical record number: [id number] Date of birth: [birth year] Age: 55 y.o. Gender: female  Primary Care Provider: McDiarmid, Blane Ohara, MD Consultants: None Code Status: Full Code Preferred Emergency Contact: [name] [phone] Frutoso Schatz, [phone]  Chief Complaint: passed out   Assessment and Plan: Andrea Berry is a 55 y.o. female presenting with syncopal episode. PMH is significant for Legal blindness, Neuromyelitis optica (devic dz), Chronic central neuropathic pain, SLE (systemic lupus erythematosus related syndrome), History of pulmonary embolism, ILD, Microcytic anemia   Syncopal Event  Patient reports that standing in kitchen and then only remembers waking up to the voice of her son and being found on floor.  Denies any hx of seizures, denies post-ictal state.  Also no lightheadedness, diziness, shortness of breath prior to syncopal episode.  Fall was not witnessed.  Patient had knee and head pain following episode.  Electrolytes and Glucose normal levels.  Hgb is 6.3 and MCV of 74. Head CT negative for bleed or malignant process. Concern for potential cardiac etiology. EKG shows inverted T waves without prolonged PR interval, no signs of AV block. No ST segment elevation noted. Given hx of hypertrophic cardiomyopathy could still concern cardiac etiology for syncope. No recent diarrhea or emesis. - Admit to FPTS, Attending Dr Erin Hearing - cardiac monitoring  - orthostatic vitals after transfusion of 2 units pRBC  - monitor VS per floor protocol  - echocardiogram   Symptomatic Anemia Hgb 6.3 and MCV 74. Previously 11.3 and 68.5 respectvely in February of 2022. FOBT negative. Started on mIVF.  Syncopal episode likely due to anemia.  Less likely due to seizure, or orthostatic hypotension.  Patient has history of Microcytic Anemia and possible  Lupus Anticoagulant disorder, though no laboratory confirmation of this. Sees rheumotologist for Lupus.  No findings consistent with GI bleed.  FOBT negative.  No dark stools or blood in bowel movement reported nor visualized on exam of stool sample upon presentation.  Previous studies negative for Thalassemia.   Differential cause broad and includes Lupus/Rheumatologic, Medication induced, HOCM. Hx notable for 108mm sessile polyp removed on colonoscopy in 2018 as well as 22mm polyp. Could be multifactorial given drop in Hgb but elevation in MCV.  Concern noted in past for possible Sarcoidosis.  Currently being transfused with 2U pRBC's.  Will plan to monitor and obtain further testing if appropriate post-transfusion. - consider GI consult - AM CBC post-transfusion - Continue mIVF - Continuous O2 Monitoring - follow up HIV, TSH - PT/OT eval and treat  Concern for Pneumonia   Chronic Interstitial Lung Disease  Long COVID Mild Restrictive Lung disease pattern on chest X-Ray.  Severe reduction in diffusing capacity. Recently discontinued home O2 2 months ago after having COVID-19 in 01/2020.  Recent infection of Moraxella on Bronchoscopy that was treated with 10 days of Augmentin.  COVID-19 admission testing negative.  Patient reports Temp of 99 earlier today.  Denies cough, new shortness of breath.  Thought less likely bacterial pneumonia given lack of symptoms.  Chest X-Ray findings possibly related to chronic ILD. Consider Chest CT for further evaluation of lungs. - Continuous pulse ox - Continue monitoing for infectious symptoms  Neuromyelitis optica Patient is legally blind.  Currently following with ophthalmology.  No acute changes in vision. - Continue home ophthalmic solutions   SLE Follows with [facility] rheumatology.  Home medication includes prednisone 5 mg daily and she receives rituximab  infusions every 6 months. - Continue daily Prednisone   AKI Creatinine of 1.27.  Likely due to  pre-renal cause due to low renal perfusion in setting of anemia.  - AM BMP - Continue mIVF  Concern for HOCM  Hypertension induced Cariomyopathy ECHO on 03/01/20 showed left ventricle has hyperdynamic function. The left ventricle has no regional wall motion abnormalities. There is moderate concentric left ventricular hypertrophy and severe basal septal hypertrophy. There is a mid intracavitary gradient due to hyperdynamic LVF with peak resting gradient 41mmHg.  - Obtain ECHO - Diltiazem in setting of soft blood pressures  History of PE  Hx of Concern for stroke PE occurred in 2017.  Home medications include Xarelto 10 mg daily.  Last dose was today.  Currently holding home Xarelto given anemia, though may want to restart given history. - Hold home Xarelto   History of transverse myelitis, Devic's disease Diqagnosed in 2017.  Chronic left upper and lower extremity weakness.  Home medications include tramadol, Zanaflex, Neurontin & Trileptal. -Continue home schedule Trileptal, Zanaflex and Gabapentin -Can consider re-adding Tramadol for significant pain  Insomnia Home medication includes Ativan 0.5 mg nightly. - Contiu Ativan as needed   Prednisone-induced osteoporosis Patient is on weekly alendronate at home.  Last received on 6/25.  No evidence of factuyrs on X-Ray of knee or Head CT.  Mood disorder Home medication includes Cymbalta 60 mg daily -Continue home Cymbalta  GERD On PPi daily at home. - Daily Pantoprazole     Hypertension Blood pressure since arrival have ranged from 87/50-101/61.  Patient on Diltiazem. - Continue to monitor - Currently holding home Diltiazem      FEN/GI: Heart Healthy Diet Prophylaxis: SCD's  Disposition: med-surg  History of Present Illness:  Andrea Berry is a 55 y.o. female presenting after a syncopal event.  She reports that she was standing earlier this evening and fell. She reports after falling she heard her son calling and woke  up on the floor. She reports her most recent temp was 99. She denies cough reports some SOB when she is exerting herself at times.  She states that she has had to have blood transfusions before. She also reports that she has had blood clots in her lungs before with the last episode 5 years ago.  She denies tobacco use, alcohol consumption or any recreational drug use.  Patient reports having a walker at home but tries to walk without it.  Review Of Systems: Per HPI with the following additions:   Review of Systems  Constitutional:  Positive for fever. Negative for appetite change.  HENT:  Negative for sore throat.   Respiratory:  Negative for cough and shortness of breath.   Cardiovascular:  Negative for chest pain.       DOE  Gastrointestinal:  Negative for abdominal distention, diarrhea and vomiting.  Genitourinary:  Negative for difficulty urinating and dysuria.  Musculoskeletal:  Positive for arthralgias.  Neurological:  Positive for syncope, weakness and light-headedness. Negative for seizures and headaches.       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   ILD (interstitial lung disease) ([place]) 08/31/2019   Elevated BP without diagnosis of hypertension 08/28/2019   Constipation 08/04/2019   Lupus ([place]) 04/02/2019   Migraine without aura 03/27/2019   Steroid-induced osteopenia 03/23/2019   Chronic rhinitis 01/07/2019   History of pulmonary embolism 12/10/2018   SLE (systemic lupus  erythematosus related syndrome) ([place]) 11/07/2018   Mild cognitive impairment 10/08/2018   Personal history of immunosupression therapy 10/08/2018   Long-term corticosteroid use 10/08/2018   Chronic central neuropathic pain 10/02/2018   Lupus anticoagulant disorder ([place]) 10/02/2018   Chronic pain syndrome 07/12/2018   Chronic coughing 09/10/2016   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[place]) 01/03/2016   Anemia of chronic disease 10/28/2015   Mood disorder ([place]) 09/21/2015   Legal blindness 01/30/2014   Chronic GERD 03/20/2013    Past Medical History: Past Medical History:  Diagnosis Date   Acute respiratory failure with hypoxia ([place]) 02/16/2020   Anemia of chronic disease 10/28/2015   Anxiety 07/12/2018   Arthritis    Ataxia 01/03/2016   Blind    Chronic central neuropathic pain 10/02/2018   [facility] 09/2017   Chronic coughing 09/10/2016   Chronic pain syndrome 07/12/2018   COVID-19 02/04/2020   Decreased strength 10/08/2018   Proximal leg muscles   Depression    Depression with anxiety 01/03/2016   Devic's disease ([place])    Dysesthesia 01/03/2016   Dyspnea on exertion 11/07/2018   Eustachian tube dysfunction 11/18/2018   L>R. Followed by [facility] ENT.   Eustachian tube dysfunction, bilateral 09/18/2018   Eustachian tube dysfunction, left 11/13/2018   L>R. Followed by [facility] ENT.   GERD (gastroesophageal reflux disease)    Greater trochanteric bursitis of left hip 04/27/2020   Formatting of this note might be different from the original. Added automatically from request for surgery [id number]  Formatting of this note might be different from the original. Added automatically from request for surgery [id number]   Headache(784.0)    History of chicken pox 10/08/2018   History of COVID-19 02/16/2020   History of CVA (cerebrovascular accident)    History of kidney stones    Hypertension    Hypertrophic cardiomyopathy ([place]) 03/02/2020   Hyponatremia 11/25/2015   Impairment of balance  10/08/2018   Legal blindness 01/30/2014   Long-term corticosteroid use 10/08/2018   Lupus ([place])    Lupus anticoagulant disorder ([place]) 10/02/2018   Pulmonary Embolism 2017   Lupus cerebritis ([place]) 11/25/2015   Possible explanation of 11/25/2015 acute hyperintensity findings on Brain MRI.    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[place]) 01/03/2016   Orthostatic dizziness, Recurrent 10/08/2018   Associated with medications   Otalgia 07/12/2018   Personal history of immunosupression therapy 10/08/2018   Chronic Azathioprine and prednisone therapy for Devic's disease   Pigmented skin lesion of uncertain nature 1/51/7616   Pneumonia 02/21/2020   Postmenopause 10/21/2020   Shortness of breath    due to medication   Skin lesion of chest wall 11/07/2018   SLE (systemic lupus erythematosus related syndrome) ([place]) 11/07/2018   Smith antibody positive 04/07/2019   Stroke ([place]) 2008   visually impaired   Ureteral calculi 2018   Vitamin D deficiency 10/06/2018    Past Surgical History: Past Surgical History:  Procedure Laterality Date   BREAST BIOPSY     BREAST CYST EXCISION Left pt unsure   COLONOSCOPY WITH PROPOFOL N/A 07/26/2017   Procedure: COLONOSCOPY WITH PROPOFOL;  Surgeon: Carol Ada, MD;  Location:  [facility];  Service: Endoscopy;  Laterality: N/A;   ESOPHAGOGASTRODUODENOSCOPY (EGD) WITH PROPOFOL N/A 07/26/2017   Procedure: ESOPHAGOGASTRODUODENOSCOPY (EGD) WITH PROPOFOL;  Surgeon: Carol Ada, MD;  Location: [facility];  Service: Endoscopy;  Laterality: N/A;   PORTA CATH INSERTION     TUBAL LIGATION     TUBAL LIGATION      Social History: Social History   Tobacco Use   Smoking status: Never   Smokeless tobacco: Never  Vaping Use   Vaping Use: Never used  Substance Use Topics   Alcohol use: No   Drug use: No     Family History: Family History  Problem Relation Age of Onset   Cancer Mother    Cancer Father    Lupus Sister      Allergies and Medications: Allergies  Allergen  Reactions   Beta Adrenergic Blockers Other (See Comments)    Diagnosis of hypertrophic cardiomyopathy   Ceftriaxone Other (See Comments)    Multiorgan inflammation with drug fever and multiple elevated inflammatory markers.  Resolved upon cessation of abx.   No current facility-administered medications on file prior to encounter.   Current Outpatient Medications on File Prior to Encounter  Medication Sig Dispense Refill   alendronate (FOSAMAX) 70 MG tablet TAKE 1 TABLET BY MOUTH IN THE MORNING EVERY 7 DAYS (Patient taking differently: Take 70 mg by mouth once a week.) 12 tablet 3   azelastine (ASTELIN) 0.1 % nasal spray PLACE 2 SPRAYS INTO BOTH NOSTRILS 2 (TWO) TIMES DAILY (Patient taking differently: Place 2 sprays into both nostrils 2 (two) times daily.) 30 mL 0   diltiazem (CARDIZEM CD) 180 MG 24 hr capsule TAKE 1 CAPSULE BY MOUTH EVERY DAY (Patient taking differently: Take 180 mg by mouth daily.) 90 capsule 3   DULoxetine (CYMBALTA) 60 MG capsule TAKE 1 CAPSULE BY MOUTH EVERY DAY (Patient taking differently: Take 60 mg by mouth daily.) 90 capsule 3   esomeprazole (NEXIUM) 20 MG capsule Take 1 capsule (20 mg total) by mouth daily at 12 noon. 90 capsule 3   fluticasone (FLONASE) 50 MCG/ACT nasal spray Place 1 spray into both nostrils daily as needed for allergies or rhinitis.      gabapentin (NEURONTIN) 300 MG capsule Take 600 mg by mouth 3 (three) times daily.     LASTACAFT 0.25 % SOLN Place 1 drop into both eyes 2 (two) times daily.      LINZESS 145 MCG CAPS capsule TAKE ONE CAPSULE EVERY DAY ON EMPTY STOMACH AT LEAST 30MIN BEFORE 1ST MEAL OF THE DAY (Patient taking differently: Take 145 mcg by mouth daily before breakfast.) 90 capsule 3   LORazepam (ATIVAN) 0.5 MG tablet Take 1 tablet (0.5 mg total) by mouth 2 (two) times daily as needed for anxiety. (Patient taking differently: Take 0.5 mg by mouth daily as needed for anxiety.) 30 tablet 5   OXcarbazepine (TRILEPTAL) 150 MG tablet Take 150  mg by mouth daily.     Polyethyl Glycol-Propyl Glycol (SYSTANE) 0.4-0.3 % SOLN Place 1 drop into both eyes daily as needed (dry eyes).     predniSONE (DELTASONE) 5 MG tablet Take 1 tablet (5 mg total) by mouth daily. 90 tablet 1   RiTUXimab (RITUXAN IV) Inject 1,000 mg into the vein See admin instructions. 1,000 mg into the vein every 6 months     tiZANidine (ZANAFLEX) 4 MG tablet Take 4 mg by mouth at bedtime as needed for muscle spasms.      traMADol (ULTRAM) 50 MG tablet TAKE  1 TABLET (50 MG TOTAL) BY MOUTH EVERY 6 (SIX) HOURS AS NEEDED. (Patient taking differently: Take 50 mg by mouth every 6 (six) hours as needed for moderate pain.) 60 tablet 5   XARELTO 10 MG TABS tablet TAKE 1 TABLET (10 MG TOTAL) BY MOUTH DAILY. WITH A MEAL (Patient taking differently: Take 10 mg by mouth daily.) 90 tablet 1    Objective: BP 110/64   Pulse 78   Temp 99.1 F (37.3 C) (Oral)   Resp (!) 24   Ht 5\' 1"  (1.549 m)   Wt 83.9 kg   SpO2 91%   BMI 34.96 kg/m   Physical Exam HENT:     Head: Normocephalic and atraumatic.     Mouth/Throat:     Mouth: Mucous membranes are moist.  Eyes:     General: No scleral icterus.    Comments: No conjunctival pallor   Cardiovascular:     Rate and Rhythm: Normal rate and regular rhythm.     Pulses: Normal pulses.     Heart sounds: Normal heart sounds.  Pulmonary:     Effort: Pulmonary effort is normal.     Breath sounds: Normal breath sounds.  Skin:    General: Skin is warm.     Capillary Refill: Capillary refill takes less than 2 seconds.  Neurological:     Mental Status: She is alert. Mental status is at baseline.  Psychiatric:        Mood and Affect: Mood normal.        Behavior: Behavior normal.     Labs and Imaging: CBC BMET  Recent Labs  Lab 03/05/21 1754  WBC 4.4  HGB 6.3*  HCT 22.0*  PLT 173   Recent Labs  Lab 03/05/21 1948  NA 134*  K 4.0  CL 107  CO2 21*  BUN 10  CREATININE 1.27*  GLUCOSE 119*  CALCIUM 8.2*     EKG: Normal  sinus with ST elevations and depressions in all leads  Delora Fuel, MD 03/06/2021, 12:37 AM PGY-1, [place] Intern pager: [phone], text pages welcome    FPTS Upper-Level Resident Addendum   I have independently interviewed and examined the patient. I have discussed the above with the Dr. Manus Rudd and agree with the documentation. My edits for correction/addition/clarification are included above. Please see any attending notes.   Eulis Foster, MD PGY-2, [place] [facility] 03/06/2021 1:53 AM  FPTS Service pager: [phone] (text pages welcome through [facility])

## 2021-03-05 NOTE — ED Triage Notes (Addendum)
Patient was at home in chair, stood up to grab something out of kitchen and woke up on the floor. Patient very hypotensive upon arrival. Patient reports she may have hit her head. States back of head. On Xarelto. Patient reports left knee pain since waking up. Reports small headache. Denies CP, neck pain. Hx of DVTs.

## 2021-03-05 NOTE — ED Provider Notes (Signed)
[facility] EMERGENCY DEPARTMENT Provider Note   CSN: [id number] Arrival date & time: 03/05/21  1745     History Chief Complaint  Patient presents with   Near Syncope   Hypotension    Andrea Berry is a 55 y.o. female.  HPI Patient presents via EMS after an episode of syncope.  Patient has multiple medical issues, including blindness, lupus. However, the patient states that he was in her usual state of health until today, when she had episode of syncope, fall. Since that time she has had no chest pain, denies weakness in any extremity, denies confusion, disorientation. She does have pain in her left knee and occiput.  Pain is sore, moderate, persistent. Per EMS the patient was hypotensive, with a systolic less than 60 initially.  This improved somewhat with 500 mL of normal saline.     Past Medical History:  Diagnosis Date   Acute respiratory failure with hypoxia ([place]) 02/16/2020   Anemia of chronic disease 10/28/2015   Anxiety 07/12/2018   Arthritis    Ataxia 01/03/2016   Blind    Chronic central neuropathic pain 10/02/2018   [facility]x [facility] 09/2017   Chronic coughing 09/10/2016   Chronic pain syndrome 07/12/2018   COVID-19 02/04/2020   Decreased strength 10/08/2018   Proximal leg muscles   Depression    Depression with anxiety 01/03/2016   Devic's disease ([place])    Dysesthesia 01/03/2016   Dyspnea on exertion 11/07/2018   Eustachian tube dysfunction 11/18/2018   L>R. Followed by [facility].   Eustachian tube dysfunction, bilateral 09/18/2018   Eustachian tube dysfunction, left 11/13/2018   L>R. Followed by [facility] ENT.   GERD (gastroesophageal reflux disease)    Greater trochanteric bursitis of left hip 04/27/2020   Formatting of this note might be different from the original. Added automatically from request for surgery [id number]  Formatting of this note might be different from the original. Added automatically from request for surgery [id number]    Headache(784.0)    History of chicken pox 10/08/2018   History of COVID-19 02/16/2020   History of CVA (cerebrovascular accident)    History of kidney stones    Hypertension    Hypertrophic cardiomyopathy ([place]) 03/02/2020   Hyponatremia 11/25/2015   Impairment of balance 10/08/2018   Legal blindness 01/30/2014   Long-term corticosteroid use 10/08/2018   Lupus ([place])    Lupus anticoagulant disorder ([place]) 10/02/2018   Pulmonary Embolism 2017   Lupus cerebritis ([place]) 11/25/2015   Possible explanation of 11/25/2015 acute hyperintensity findings on Brain MRI.    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/2016   Orthostatic dizziness, Recurrent 10/08/2018   Associated with medications   Otalgia 07/12/2018   Personal history of immunosupression therapy 10/08/2018   Chronic Azathioprine and prednisone therapy for Devic's disease   Pigmented skin lesion of uncertain nature 4/69/6295   Pneumonia 02/21/2020   Postmenopause 10/21/2020   Shortness of breath    due to medication   Skin lesion of chest wall 11/07/2018   SLE (systemic lupus erythematosus related syndrome) ([place]) 11/07/2018   Smith antibody positive 04/07/2019   Stroke ([place]) 2008   visually impaired   Ureteral calculi 2018   Vitamin D deficiency 10/06/2018    Patient Active Problem List   Diagnosis Date Noted   Postmenopause 10/21/2020   Transaminasemia 10/21/2020   Lumbar nerve root impingement 10/04/2020   OSA (obstructive sleep apnea), Severe 10/04/2020   Left hemiparesis ([place]) 05/26/2020   Chronic respiratory failure with  hypoxia ([place])    Hypertrophic cardiomyopathy ([place]) 03/02/2020   Microcytic anemia 02/26/2020   Physical deconditioning 02/16/2020   Specific antibody deficiency 01/18/2020   ILD (interstitial lung disease) ([place]) 08/31/2019   Elevated BP without diagnosis of hypertension 08/28/2019   Constipation 08/04/2019   Lupus ([place]) 04/02/2019   Migraine without aura 03/27/2019   Steroid-induced osteopenia 03/23/2019   Chronic  rhinitis 01/07/2019   History of pulmonary embolism 12/10/2018   SLE (systemic lupus erythematosus related syndrome) ([place]) 11/07/2018   Mild cognitive impairment 10/08/2018   Personal history of immunosupression therapy 10/08/2018   Long-term corticosteroid use 10/08/2018   Chronic central neuropathic pain 10/02/2018   Lupus anticoagulant disorder ([place]) 10/02/2018   Chronic pain syndrome 07/12/2018   Chronic coughing 09/10/2016   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[place]) 01/03/2016   Anemia of chronic disease 10/28/2015   Mood disorder ([place]) 09/21/2015   Legal blindness 01/30/2014   Chronic GERD 03/20/2013    Past Surgical History:  Procedure Laterality Date   BREAST BIOPSY     BREAST CYST EXCISION Left pt unsure   COLONOSCOPY WITH PROPOFOL N/A 07/26/2017   Procedure: COLONOSCOPY WITH PROPOFOL;  Surgeon: Carol Ada, MD;  Location: [facility] END[facility]COPY;  Service: Endoscopy;  Laterality: N/A;   ESOPHAGOGASTRODUODENOSCOPY (EGD) WITH PROPOFOL N/A 07/26/2017   Procedure: ESOPHAGOGASTRODUODENOSCOPY (EGD) WITH PROPOFOL;  Surgeon: Carol Ada, MD;  Location: [facility] ENDOS[facility];  Service: Endoscopy;  Laterality: N/A;   PORTA CATH INSERTION     TUBAL LIGATION     TUBAL LIGATION       OB History     Gravida  3   Para  3   Term  3   Preterm      AB      Living  3      SAB      IAB      Ectopic      Multiple      Live Births  3           Family History  Problem Relation Age of Onset   Cancer Mother    Cancer Father    Lupus Sister     Social History   Tobacco Use   Smoking status: Never   Smokeless tobacco: Never  Vaping Use   Vaping Use: Never used  Substance Use Topics   Alcohol use: No   Drug use: No    Home Medications Prior to Admission medications   Medication Sig Start Date End Date Taking? Authorizing Provider  alendronate (FOSAMAX) 70 MG tablet TAKE 1 TABLET BY MOUTH IN THE MORNING EVERY 7 DAYS Patient taking differently: Take 70 mg by mouth once a  week. 01/27/21  Yes McDiarmid, Blane Ohara, MD  azelastine (ASTELIN) 0.1 % nasal spray PLACE 2 SPRAYS INTO BOTH NOSTRILS 2 (TWO) TIMES DAILY Patient taking differently: Place 2 sprays into both nostrils 2 (two) times daily. 09/13/20  Yes Garnet Sierras, DO  diltiazem (CARDIZEM CD) 180 MG 24 hr capsule TAKE 1 CAPSULE BY MOUTH EVERY DAY Patient taking differently: Take 180 mg by mouth daily. 01/27/21  Yes McDiarmid, Blane Ohara, MD  DULoxetine (CYMBALTA) 60 MG capsule TAKE 1 CAPSULE BY MOUTH EVERY DAY Patient taking differently: Take 60 mg by mouth daily. 09/15/20  Yes McDiarmid, Blane Ohara, MD  esomeprazole (NEXIUM) 20 MG capsule Take 1 capsule (20 mg total) by mouth daily at 12 noon. 10/20/20  Yes McDiarmid, Blane Ohara, MD  fluticasone (FLONASE) 50 MCG/ACT nasal spray Place 1 spray into both  nostrils daily as needed for allergies or rhinitis.  09/18/18  Yes [provider]  gabapentin (NEURONTIN) 300 MG capsule Take 600 mg by mouth 3 (three) times daily. 09/21/20  Yes [provider]  LASTACAFT 0.25 % SOLN Place 1 drop into both eyes 2 (two) times daily.  01/19/20  Yes [provider]  LINZESS 145 MCG CAPS capsule TAKE ONE CAPSULE EVERY DAY ON EMPTY STOMACH AT [place] OF THE DAY Patient taking differently: Take 145 mcg by mouth daily before breakfast. 11/09/20  Yes McDiarmid, Blane Ohara, MD  LORazepam (ATIVAN) 0.5 MG tablet Take 1 tablet (0.5 mg total) by mouth 2 (two) times daily as needed for anxiety. Patient taking differently: Take 0.5 mg by mouth daily as needed for anxiety. 11/03/20  Yes McDiarmid, Blane Ohara, MD  OXcarbazepine (TRILEPTAL) 150 MG tablet Take 150 mg by mouth daily. 04/26/20  Yes [provider]  Polyethyl Glycol-Propyl Glycol (SYSTANE) 0.4-0.3 % SOLN Place 1 drop into both eyes daily as needed (dry eyes).   Yes [provider]  predniSONE (DELTASONE) 5 MG tablet Take 1 tablet (5 mg total) by mouth daily. 08/28/19  Yes McDiarmid, Blane Ohara, MD  RiTUXimab  (RITUXAN IV) Inject 1,000 mg into the vein See admin instructions. 1,000 mg into the vein every 6 months   Yes [provider]  tiZANidine (ZANAFLEX) 4 MG tablet Take 4 mg by mouth at bedtime as needed for muscle spasms.  01/18/20  Yes [provider]  traMADol (ULTRAM) 50 MG tablet TAKE 1 TABLET (50 MG TOTAL) BY MOUTH EVERY 6 (SIX) HOURS AS NEEDED. Patient taking differently: Take 50 mg by mouth every 6 (six) hours as needed for moderate pain. 09/21/20  Yes McDiarmid, Blane Ohara, MD  XARELTO 10 MG TABS tablet TAKE 1 TABLET (10 MG TOTAL) BY MOUTH DAILY. WITH A MEAL Patient taking differently: Take 10 mg by mouth daily. 10/17/20  Yes McDiarmid, Blane Ohara, MD    Allergies    Beta adrenergic blockers and Ceftriaxone  Review of Systems   Review of Systems  Constitutional:        Per HPI, otherwise negative  HENT:         Per HPI, otherwise negative  Eyes:        Legally blind  Respiratory:         Per HPI, otherwise negative  Cardiovascular:        Per HPI, otherwise negative  Gastrointestinal:  Negative for vomiting.  Endocrine:       Negative aside from HPI  Genitourinary:        Neg aside from HPI   Musculoskeletal:        Per HPI, otherwise negative  Skin: Negative.   Neurological:  Positive for syncope.   Physical Exam Updated Vital Signs BP (!) 93/54   Pulse 81   Temp 99.7 F (37.6 C) (Oral)   Resp (!) 24   Ht 5\' 1"  (1.549 m)   Wt 83.9 kg   SpO2 97%   BMI 34.96 kg/m   Physical Exam Vitals and nursing note reviewed.  Constitutional:      General: She is not in acute distress.    Appearance: She is well-developed.  HENT:     Head: Normocephalic and atraumatic.  Eyes:     Conjunctiva/sclera: Conjunctivae normal.  Cardiovascular:     Rate and Rhythm: Normal rate and regular rhythm.  Pulmonary:     Effort: Pulmonary effort is normal.  No respiratory distress.     Breath sounds: Normal breath sounds. No stridor.  Abdominal:     General: There is no  distension.  Musculoskeletal:       Legs:  Skin:    General: Skin is warm and dry.  Neurological:     Mental Status: She is alert.     Cranial Nerves: No cranial nerve deficit.     Motor: No weakness.     Coordination: Coordination normal.     Comments: Blind otherwise cranial nerves unremarkable    ED Results / Procedures / Treatments   Labs (all labs ordered are listed, but only abnormal results are displayed) Labs Reviewed  CBC WITH DIFFERENTIAL/PLATELET - Abnormal; Notable for the following components:      Result Value   RBC 2.95 (*)    Hemoglobin 6.3 (*)    HCT 22.0 (*)    MCV 74.6 (*)    MCH 21.4 (*)    MCHC 28.6 (*)    RDW 16.4 (*)    Lymphs Abs 0.6 (*)    All other components within normal limits  CBG MONITORING, ED - Abnormal; Notable for the following components:   Glucose-Capillary 130 (*)    All other components within normal limits  SARS CORONAVIRUS 2 (TAT 6-24 HRS)  COMPREHENSIVE METABOLIC PANEL  I-STAT BETA HCG BLOOD, ED ([facility], [facility], [facility] ONLY)  POC OCCULT BLOOD, ED  POC OCCULT BLOOD, ED  TYPE AND SCREEN  PREPARE RBC (CROSSMATCH)    EKG EKG Interpretation  Date/Time:  Sunday March 05 2021 17:55:35 EDT Ventricular Rate:  82 PR Interval:  153 QRS Duration: 77 QT Interval:  382 QTC Calculation: 447 R Axis:   71 Text Interpretation: Sinus rhythm Probable LVH with secondary repol abnrm T wave abnormality Abnormal ECG Confirmed by Carmin Muskrat [phone]) on 03/05/2021 5:58:32 PM  Radiology CT HEAD WO CONTRAST  Result Date: 03/05/2021 CLINICAL DATA:  Fall.  Patient hit back of head. EXAM: CT HEAD WITHOUT CONTRAST TECHNIQUE: Contiguous axial images were obtained from the base of the skull through the vertex without intravenous contrast. COMPARISON:  Feb 04, 2020 FINDINGS: Brain: No evidence of acute infarction, hemorrhage, hydrocephalus, extra-axial collection or mass lesion/mass effect. Vascular: No hyperdense vessel or unexpected calcification. Skull: Normal.  Negative for fracture or focal lesion. Sinuses/Orbits: No acute finding. Other: None. IMPRESSION: Normal study.  No cause for symptoms identified. Electronically Signed   By: Dorise Bullion III M.D   On: 03/05/2021 18:37   DG Chest Port 1 View  Result Date: 03/05/2021 CLINICAL DATA:  Fall.  Syncope. EXAM: PORTABLE CHEST 1 VIEW COMPARISON:  February 29, 2020 FINDINGS: Stable cardiomegaly. The hila and mediastinum are unremarkable. No pneumothorax. The right lung is clear. Infiltrate seen in the left perihilar region. No other acute abnormalities. IMPRESSION: Left perihilar infiltrate worrisome for pneumonia. Recommend short-term follow-up imaging to ensure resolution. Electronically Signed   By: Dorise Bullion III M.D   On: 03/05/2021 18:39   DG Knee Complete 4 Views Left  Result Date: 03/05/2021 CLINICAL DATA:  Pain after fall.  Syncope. EXAM: LEFT KNEE - COMPLETE 4+ VIEW COMPARISON:  None. FINDINGS: No evidence of fracture, dislocation, or joint effusion. No evidence of arthropathy or other focal bone abnormality. Soft tissues are unremarkable. IMPRESSION: Negative. Electronically Signed   By: Dorise Bullion III M.D   On: 03/05/2021 18:40    Procedures Procedures   Medications Ordered in ED Medications  0.9 %  sodium chloride infusion ( Intravenous New  Bag/Given 03/05/21 1914)  sodium chloride 0.9 % bolus 1,000 mL (0 mLs Intravenous Stopped 03/05/21 2026)    And  0.9 %  sodium chloride infusion (has no administration in time range)  0.9 %  sodium chloride infusion (has no administration in time range)    ED Course  I have reviewed the triage vital signs and the nursing notes.  Pertinent labs & imaging results that were available during my care of the patient were reviewed by me and considered in my medical decision making (see chart for details).   8:36 PM Patient remains in no distress.  She does require supplemental oxygen as her saturation varies in the upper 80s, low 90s.  In discussing  this she notes that she was started on supplemental oxygen after an episode of COVID last year and only recently switched away from using it 24/7. Patient denies any ongoing dyspnea beyond fatigue with exertion, dyspnea with exertion.  She denies cough, fever.  X-ray reviewed, discussed, suggestive of pneumonia.  This may be residual from COVID, but given mild temperature elevation, hypoxia, patient will start a course of antibiotics. Patient's Hemoccult test is negative, rectal exam unremarkable aside from external hemorrhoid.  Vital signs have improved, but given concern for symptomatic anemia, hypotension, she will require admission, transfusion.  Patient has consented for transfusion.    MDM Rules/Calculators/A&P MDM Number of Diagnoses or Management Options Community acquired pneumonia of left upper lobe of lung: new, needed workup Symptomatic anemia: new, needed workup   Amount and/or Complexity of Data Reviewed Clinical lab tests: ordered and reviewed Tests in the radiology section of CPT: ordered and reviewed Tests in the medicine section of CPT: reviewed and ordered Decide to obtain previous medical records or to obtain history from someone other than the patient: yes Obtain history from someone other than the patient: yes Review and summarize past medical records: yes Discuss the patient with other providers: yes Independent visualization of images, tracings, or specimens: yes  Risk of Complications, Morbidity, and/or Mortality Presenting problems: high Diagnostic procedures: high Management options: high  Critical Care Total time providing critical care: 30-74 minutes (35)  Patient Progress Patient progress: stable   Final Clinical Impression(s) / ED Diagnoses Final diagnoses:  Symptomatic anemia  Community acquired pneumonia of left upper lobe of lung     Carmin Muskrat, MD 03/05/21 2039

## 2021-03-05 NOTE — ED Notes (Signed)
Hurshel Keys, son, [phone] would like an update when available

## 2021-03-06 ENCOUNTER — Inpatient Hospital Stay (HOSPITAL_COMMUNITY): Payer: Medicare Other

## 2021-03-06 DIAGNOSIS — I351 Nonrheumatic aortic (valve) insufficiency: Secondary | ICD-10-CM

## 2021-03-06 DIAGNOSIS — R55 Syncope and collapse: Secondary | ICD-10-CM

## 2021-03-06 LAB — HIV ANTIBODY (ROUTINE TESTING W REFLEX): HIV Screen 4th Generation wRfx: NONREACTIVE

## 2021-03-06 LAB — CBC WITH DIFFERENTIAL/PLATELET
Abs Immature Granulocytes: 0.06 10*3/uL (ref 0.00–0.07)
Basophils Absolute: 0 10*3/uL (ref 0.0–0.1)
Basophils Relative: 0 %
Eosinophils Absolute: 0 10*3/uL (ref 0.0–0.5)
Eosinophils Relative: 0 %
HCT: 40.1 % (ref 36.0–46.0)
Hemoglobin: 12.1 g/dL (ref 12.0–15.0)
Immature Granulocytes: 1 %
Lymphocytes Relative: 11 %
Lymphs Abs: 0.7 10*3/uL (ref 0.7–4.0)
MCH: 22.6 pg — ABNORMAL LOW (ref 26.0–34.0)
MCHC: 30.2 g/dL (ref 30.0–36.0)
MCV: 75 fL — ABNORMAL LOW (ref 80.0–100.0)
Monocytes Absolute: 0.7 10*3/uL (ref 0.1–1.0)
Monocytes Relative: 10 %
Neutro Abs: 4.9 10*3/uL (ref 1.7–7.7)
Neutrophils Relative %: 78 %
Platelets: 269 10*3/uL (ref 150–400)
RBC: 5.35 MIL/uL — ABNORMAL HIGH (ref 3.87–5.11)
RDW: 18.6 % — ABNORMAL HIGH (ref 11.5–15.5)
WBC: 6.3 10*3/uL (ref 4.0–10.5)
nRBC: 0 % (ref 0.0–0.2)

## 2021-03-06 LAB — TYPE AND SCREEN
ABO/RH(D): A POS
Antibody Screen: NEGATIVE
Unit division: 0
Unit division: 0

## 2021-03-06 LAB — BASIC METABOLIC PANEL
Anion gap: 7 (ref 5–15)
BUN: 9 mg/dL (ref 6–20)
CO2: 22 mmol/L (ref 22–32)
Calcium: 8.7 mg/dL — ABNORMAL LOW (ref 8.9–10.3)
Chloride: 109 mmol/L (ref 98–111)
Creatinine, Ser: 0.88 mg/dL (ref 0.44–1.00)
GFR, Estimated: >60 mL/min (ref >60)
Glucose, Bld: 102 mg/dL — ABNORMAL HIGH (ref 70–99)
Potassium: 4.9 mmol/L (ref 3.5–5.1)
Sodium: 138 mmol/L (ref 135–145)

## 2021-03-06 LAB — ECHOCARDIOGRAM COMPLETE
Area-P 1/2: 4.68 cm2
Calc EF: 71.8 %
Height: 61 in
P 1/2 time: 356 msec
S' Lateral: 2.3 cm
Single Plane A2C EF: 75.5 %
Single Plane A4C EF: 65.8 %
Weight: 2960 oz

## 2021-03-06 LAB — SARS CORONAVIRUS 2 (TAT 6-24 HRS): SARS Coronavirus 2: NEGATIVE

## 2021-03-06 LAB — MAGNESIUM: Magnesium: 2 mg/dL (ref 1.7–2.4)

## 2021-03-06 LAB — BPAM RBC
Blood Product Expiration Date: 202207192359
Blood Product Expiration Date: 202207202359
ISSUE DATE / TIME: 202206262129
ISSUE DATE / TIME: 202206262345
Unit Type and Rh: 6200
Unit Type and Rh: 6200

## 2021-03-06 LAB — HEMOGLOBIN AND HEMATOCRIT, BLOOD
HCT: 40.2 % (ref 36.0–46.0)
Hemoglobin: 12 g/dL (ref 12.0–15.0)

## 2021-03-06 LAB — TSH: TSH: 1.608 u[IU]/mL (ref 0.350–4.500)

## 2021-03-06 MED ORDER — RIVAROXABAN 10 MG PO TABS
10.0000 mg | ORAL_TABLET | Freq: Every day | ORAL | Status: DC
  Administered 2021-03-06 – 2021-03-07 (×2): 10 mg via ORAL
  Filled 2021-03-06 (×3): qty 1

## 2021-03-06 NOTE — ED Notes (Signed)
Breakfast order placed ?

## 2021-03-06 NOTE — Progress Notes (Signed)
FPTS Interim Progress Note  Attempted to call patient's son, as he had requested an update from our team. No voicemail set up. Will try again as time allows.   Alcus Dad, MD PGY-1 Family Medicine [place] Intern pager: [phone]

## 2021-03-06 NOTE — Progress Notes (Signed)
Family Medicine Teaching Service Daily Progress Note Intern Pager: [phone]  Patient name: Andrea Berry Medical record number: [id number] Date of birth: [birth year] Age: 55 y.o. Gender: female  Primary Care Provider: McDiarmid, Blane Ohara, MD Consultants: None Code Status: Full  Pt Overview and Major Events to Date:  6/26: admitted  Assessment and Plan:  Syncopal Episode Concern for cardiac etiology given lack of pre-syncopal symptoms and history of ?HOCM. Echo in June 2021 showed moderate concentric LVH, severe basal septal hypertrophy, and hyperdynamic LV function. No history concerning for seizure, less likely orthostatic syncope, no suspicion for neuro event at this time (neuro exam normal). EKG with ST depressions on admission but appears unchanged from prior. -Echo ordered, not yet completed. f/u results -Cardiac monitoring -f/u orthostatic vitals -Will consult cardiology, appreciate recommendations  Anemia Hgb 6.3 on admission, received 2u pRBCs and post-transfusion Hgb 12.1. Repeat Hgb 12. Suspect lab error. FOBT negative, no signs of bleeding on history or exam. -Continue to monitor -Will restart home Xarelto  Remainder of problems chronic. Plan per H&P.  FEN/GI: Heart healthy diet PPx: Xarelto Dispo:Home in 2-3 days. Barriers include ongoing clinical workup.   Subjective:  Patient admitted overnight. No complaints this morning. States she felt well leading up to the syncopal episode. No recent illness. No pain or concerns currently.  Objective: Temp:  [98.3 F (36.8 C)-99.7 F (37.6 C)] 98.9 F (37.2 C) (06/27 0443) Pulse Rate:  [68-84] 68 (06/27 0430) Resp:  [20-26] 22 (06/27 0430) BP: (87-131)/(50-80) 125/73 (06/27 0430) SpO2:  [91 %-100 %] 95 % (06/27 0430) Weight:  [83.9 kg] 83.9 kg (06/26 1750) Physical Exam: General: alert, resting comfortably in bed, NAD Cardiovascular: RRR, normal S1/S2 without m/r/g Respiratory: normal effort, lungs CTAB Abdomen:  soft, nontender Extremities: no BLE edema Neuro: CN II-XII intact, 5/5 strength in R upper and lower extremity, 4/5 strength in L upper and lower extremity (reports this is baseline from her prior stroke), normal speech  Laboratory: Recent Labs  Lab 03/05/21 1754 03/06/21 0533  WBC 4.4 6.3  HGB 6.3* 12.1  HCT 22.0* 40.1  PLT 173 269   Recent Labs  Lab 03/05/21 1948 03/06/21 0433  NA 134* 138  K 4.0 4.9  CL 107 109  CO2 21* 22  BUN 10 9  CREATININE 1.27* 0.88  CALCIUM 8.2* 8.7*  PROT 5.2*  --   BILITOT 0.3  --   ALKPHOS 53  --   ALT 13  --   AST 20  --   GLUCOSE 119* 102*      Imaging/Diagnostic Tests: CT HEAD WO CONTRAST  Result Date: 03/05/2021 CLINICAL DATA:  Fall.  Patient hit back of head. EXAM: CT HEAD WITHOUT CONTRAST TECHNIQUE: Contiguous axial images were obtained from the base of the skull through the vertex without intravenous contrast. COMPARISON:  Feb 04, 2020 FINDINGS: Brain: No evidence of acute infarction, hemorrhage, hydrocephalus, extra-axial collection or mass lesion/mass effect. Vascular: No hyperdense vessel or unexpected calcification. Skull: Normal. Negative for fracture or focal lesion. Sinuses/Orbits: No acute finding. Other: None. IMPRESSION: Normal study.  No cause for symptoms identified. Electronically Signed   By: Dorise Bullion III M.D   On: 03/05/2021 18:37   DG Chest Port 1 View  Result Date: 03/05/2021 CLINICAL DATA:  Fall.  Syncope. EXAM: PORTABLE CHEST 1 VIEW COMPARISON:  February 29, 2020 FINDINGS: Stable cardiomegaly. The hila and mediastinum are unremarkable. No pneumothorax. The right lung is clear. Infiltrate seen in the left perihilar region. No other acute  abnormalities. IMPRESSION: Left perihilar infiltrate worrisome for pneumonia. Recommend short-term follow-up imaging to ensure resolution. Electronically Signed   By: Dorise Bullion III M.D   On: 03/05/2021 18:39   DG Knee Complete 4 Views Left  Result Date: 03/05/2021 CLINICAL  DATA:  Pain after fall.  Syncope. EXAM: LEFT KNEE - COMPLETE 4+ VIEW COMPARISON:  None. FINDINGS: No evidence of fracture, dislocation, or joint effusion. No evidence of arthropathy or other focal bone abnormality. Soft tissues are unremarkable. IMPRESSION: Negative. Electronically Signed   By: Dorise Bullion III M.D   On: 03/05/2021 18:40      Alcus Dad, MD 03/06/2021, 7:03 AM PGY-1, [place] Intern pager: [phone], text pages welcome

## 2021-03-06 NOTE — Plan of Care (Signed)

## 2021-03-06 NOTE — Progress Notes (Signed)
FPTS Interim Progress Note  Spoke with Dr. Anne Ng, patient's cardiologist. Discussed echo results (severe asymmetric left ventricular hypertrophy) and her syncopal episode concerning for cardiac etiology.   Dr Anne Ng specifically asked about the gradient on her echo. Informed him it was 80mmHg. Given this information, he does not feel she warrants inpatient consult at this time. Reviewed her vitals and EKG. States she is ok to follow-up with him outpatient this week.    Alcus Dad, MD PGY-1, [place] [facility] 03/06/2021 2:56 PM

## 2021-03-06 NOTE — ED Notes (Signed)
Pt placed in hospital bed for comfort.

## 2021-03-06 NOTE — Progress Notes (Signed)
  Echocardiogram 2D Echocardiogram has been performed.  Andrea Berry 03/06/2021, 11:24 AM

## 2021-03-06 NOTE — Evaluation (Signed)
Physical Therapy Evaluation Patient Details Name: Andrea Berry MRN: [id number] DOB: 1965-11-18 Today's Date: 03/06/2021   History of Present Illness  Pt is a 55 y/o female admitted 6/26 secondary to syncope, thought to be secondary to symptomatic anemia. PMH includes PE, SLE, neuromyelitis optica, legally blind, HTN, and transverse myelitis.  Clinical Impression  Pt admitted secondary to problem above with deficits below. Requiring min A for bed mobility and to stand from higher stretcher, and required min guard A for short distance ambulation. Pt asymptomatic throughout. Pt with visual deficits at baseline and required directional cues throughout. Anticipate pt will progress well and will not require follow up PT at d/c. Will continue to follow acutely.     Follow Up Recommendations No PT follow up    Equipment Recommendations  None recommended by PT    Recommendations for Other Services       Precautions / Restrictions Precautions Precautions: Fall Restrictions Weight Bearing Restrictions: No      Mobility  Bed Mobility Overal bed mobility: Needs Assistance Bed Mobility: Supine to Sit;Sit to Supine     Supine to sit: Min assist Sit to supine: Supervision   General bed mobility comments: Min A for trunk elevation to come to sitting.    Transfers Overall transfer level: Needs assistance Equipment used: 1 person hand held assist Transfers: Sit to/from Stand Sit to Stand: Min guard;Min assist         General transfer comment: Pt requiring min A for steadying to stand from higher stretcher height. From regular bed height only required min guard for safety.  Ambulation/Gait Ambulation/Gait assistance: Min guard Gait Distance (Feet): 5 Feet Assistive device: 1 person hand held assist Gait Pattern/deviations: Step-through pattern;Decreased stride length Gait velocity: Decreased   General Gait Details: short distance gait from stretcher to hospital bed in ED.  Min guard for safety. Pt asymptomatic throughout. Required directional cues secondary to visual deficits.  Stairs            Wheelchair Mobility    Modified Rankin (Stroke Patients Only)       Balance Overall balance assessment: Needs assistance Sitting-balance support: No upper extremity supported;Feet supported Sitting balance-Leahy Scale: Good     Standing balance support: Single extremity supported;No upper extremity supported;During functional activity Standing balance-Leahy Scale: Fair Standing balance comment: able to maintain static standing without UE support                             Pertinent Vitals/Pain Pain Assessment: No/denies pain    Home Living Family/patient expects to be discharged to:: Private residence Living Arrangements: Children Available Help at Discharge: Family;Available PRN/intermittently Type of Home: House Home Access: Stairs to enter Entrance Stairs-Rails: Right;Left;Can reach both Entrance Stairs-Number of Steps: 2 Home Layout: One level Home Equipment: Walker - 2 wheels;Cane - single point;Bedside commode;Wheelchair - manual      Prior Function Level of Independence: Needs assistance   Gait / Transfers Assistance Needed: Normally independent; normally her son assists with mobility if outside of home  ADL's / Homemaking Assistance Needed: Independent with ADLs        Hand Dominance        Extremity/Trunk Assessment   Upper Extremity Assessment Upper Extremity Assessment: Defer to OT evaluation    Lower Extremity Assessment Lower Extremity Assessment: Generalized weakness;LLE deficits/detail LLE Deficits / Details: Pt reports LLE weakness at baseline secondary to previous CVA.    Cervical / Trunk Assessment  Cervical / Trunk Assessment: Normal  Communication   Communication: No difficulties  Cognition Arousal/Alertness: Awake/alert Behavior During Therapy: WFL for tasks assessed/performed Overall  Cognitive Status: Within Functional Limits for tasks assessed                                        General Comments General comments (skin integrity, edema, etc.): Reports she can see shadows and colors and when close up can see outlines.    Exercises     Assessment/Plan    PT Assessment Patient needs continued PT services  PT Problem List Decreased strength;Decreased balance;Decreased mobility       PT Treatment Interventions DME instruction;Gait training;Stair training;Functional mobility training;Therapeutic activities;Therapeutic exercise;Balance training;Patient/family education    PT Goals (Current goals can be found in the Care Plan section)  Acute Rehab PT Goals Patient Stated Goal: to go home PT Goal Formulation: With patient Time For Goal Achievement: 03/20/21 Potential to Achieve Goals: Good    Frequency Min 3X/week   Barriers to discharge        Co-evaluation               AM-PAC PT "6 Clicks" Mobility  Outcome Measure Help needed turning from your back to your side while in a flat bed without using bedrails?: A Little Help needed moving from lying on your back to sitting on the side of a flat bed without using bedrails?: A Little Help needed moving to and from a bed to a chair (including a wheelchair)?: A Little Help needed standing up from a chair using your arms (e.g., wheelchair or bedside chair)?: A Little Help needed to walk in hospital room?: A Little Help needed climbing 3-5 steps with a railing? : A Little 6 Click Score: 18    End of Session   Activity Tolerance: Patient tolerated treatment well Patient left: in bed;with call bell/phone within reach (on bed in ED) Nurse Communication: Mobility status PT Visit Diagnosis: Other abnormalities of gait and mobility (R26.89);Muscle weakness (generalized) (M62.81)    Time: 6222-9798 PT Time Calculation (min) (ACUTE ONLY): 15 min   Charges:   PT Evaluation $PT Eval Low  Complexity: 1 Low          Lou Miner, DPT  Acute Rehabilitation Services  Pager: [phone] Office: [phone]   Andrea Berry 03/06/2021, 1:26 PM

## 2021-03-07 DIAGNOSIS — D563 Thalassemia minor: Secondary | ICD-10-CM | POA: Insufficient documentation

## 2021-03-07 LAB — CBC WITH DIFFERENTIAL/PLATELET
Abs Immature Granulocytes: 0.01 10*3/uL (ref 0.00–0.07)
Basophils Absolute: 0 10*3/uL (ref 0.0–0.1)
Basophils Relative: 0 %
Eosinophils Absolute: 0.1 10*3/uL (ref 0.0–0.5)
Eosinophils Relative: 3 %
HCT: 44 % (ref 36.0–46.0)
Hemoglobin: 13.5 g/dL (ref 12.0–15.0)
Immature Granulocytes: 0 %
Lymphocytes Relative: 21 %
Lymphs Abs: 0.8 10*3/uL (ref 0.7–4.0)
MCH: 22.7 pg — ABNORMAL LOW (ref 26.0–34.0)
MCHC: 30.7 g/dL (ref 30.0–36.0)
MCV: 73.8 fL — ABNORMAL LOW (ref 80.0–100.0)
Monocytes Absolute: 0.5 10*3/uL (ref 0.1–1.0)
Monocytes Relative: 13 %
Neutro Abs: 2.2 10*3/uL (ref 1.7–7.7)
Neutrophils Relative %: 63 %
Platelets: 253 10*3/uL (ref 150–400)
RBC: 5.96 MIL/uL — ABNORMAL HIGH (ref 3.87–5.11)
RDW: 18.7 % — ABNORMAL HIGH (ref 11.5–15.5)
WBC: 3.6 10*3/uL — ABNORMAL LOW (ref 4.0–10.5)
nRBC: 0 % (ref 0.0–0.2)

## 2021-03-07 LAB — BASIC METABOLIC PANEL
Anion gap: 7 (ref 5–15)
BUN: 7 mg/dL (ref 6–20)
CO2: 23 mmol/L (ref 22–32)
Calcium: 9 mg/dL (ref 8.9–10.3)
Chloride: 108 mmol/L (ref 98–111)
Creatinine, Ser: 0.82 mg/dL (ref 0.44–1.00)
GFR, Estimated: >60 mL/min (ref >60)
Glucose, Bld: 76 mg/dL (ref 70–99)
Potassium: 4.3 mmol/L (ref 3.5–5.1)
Sodium: 138 mmol/L (ref 135–145)

## 2021-03-07 MED ORDER — DILTIAZEM HCL ER COATED BEADS 180 MG PO CP24: 180.0000 mg | ORAL_CAPSULE | Freq: Every day | ORAL | Status: DC

## 2021-03-07 MED ORDER — LINACLOTIDE 145 MCG PO CAPS: 145.0000 ug | ORAL_CAPSULE | Freq: Every day | ORAL | Status: DC

## 2021-03-07 MED ORDER — ALENDRONATE SODIUM 70 MG PO TABS: 70.0000 mg | ORAL_TABLET | ORAL | Status: DC

## 2021-03-07 MED ORDER — RIVAROXABAN 10 MG PO TABS: 10.0000 mg | ORAL_TABLET | Freq: Every day | ORAL | Status: DC

## 2021-03-07 MED ORDER — TRAMADOL HCL 50 MG PO TABS: 50.0000 mg | ORAL_TABLET | Freq: Four times a day (QID) | ORAL | Status: DC | PRN

## 2021-03-07 MED ORDER — LORAZEPAM 0.5 MG PO TABS: 0.5000 mg | ORAL_TABLET | Freq: Every day | ORAL | Status: DC | PRN

## 2021-03-07 MED ORDER — AZELASTINE HCL 0.1 % NA SOLN: 2.0000 | Freq: Two times a day (BID) | NASAL | Status: DC

## 2021-03-07 MED ORDER — DULOXETINE HCL 60 MG PO CPEP: 60.0000 mg | ORAL_CAPSULE | Freq: Every day | ORAL | Status: DC

## 2021-03-07 NOTE — Plan of Care (Signed)
  Problem: Education: Goal: Knowledge of General Education information will improve Description Including pain rating scale, medication(s)/side effects and non-pharmacologic comfort measures Outcome: Progressing   Problem: Health Behavior/Discharge Planning: Goal: Ability to manage health-related needs will improve Outcome: Progressing   

## 2021-03-07 NOTE — Progress Notes (Signed)
Physical Therapy Treatment Patient Details Name: Andrea Berry MRN: [id number] DOB: 1966-08-24 Today's Date: 03/07/2021    History of Present Illness Pt is a 55 y/o female admitted 6/26 secondary to syncope, thought to be secondary to symptomatic anemia. PMH includes PE, SLE, neuromyelitis optica, legally blind, HTN, and transverse myelitis.    PT Comments    Pt sitting up in recliner on entry agreeable to walking with therapy. Pt limited in safe mobility by decreased vision in novel environment in presence of generalized weakness. Pt is currently supervision for transfers and min guard for ambulation in hallway with RW. D/c plans remain appropriate. Pt hopeful for d/c home this afternoon.    Follow Up Recommendations  No PT follow up     Equipment Recommendations  None recommended by PT       Precautions / Restrictions Precautions Precautions: Fall Precaution Comments: Blind Restrictions Weight Bearing Restrictions: No    Mobility  Bed Mobility               General bed mobility comments: pt up in recliner on entry    Transfers Overall transfer level: Needs assistance Equipment used: Rolling walker (2 wheeled) Transfers: Sit to/from Stand Sit to Stand: Supervision         General transfer comment: supervision of safety  Ambulation/Gait Ambulation/Gait assistance: Min guard Gait Distance (Feet): 200 Feet Assistive device: Rolling walker (2 wheeled) Gait Pattern/deviations: Step-through pattern;Decreased stride length Gait velocity: Decreased Gait velocity interpretation: <1.8 ft/sec, indicate of risk for recurrent falls General Gait Details: min guard for safety, requires increased verbal cuing due to visual deficits         Balance Overall balance assessment: Needs assistance Sitting-balance support: No upper extremity supported;Feet supported Sitting balance-Leahy Scale: Good       Standing balance-Leahy Scale: Good                               Cognition Arousal/Alertness: Awake/alert Behavior During Therapy: WFL for tasks assessed/performed Overall Cognitive Status: Within Functional Limits for tasks assessed                                           General Comments General comments (skin integrity, edema, etc.): able to navigate with use of light and shadows      Pertinent Vitals/Pain Pain Assessment: No/denies pain Pain Score: 7  Pain Location: Pt reports chonic LT Lower back pain and LT hand pain. Pt reports a 7/10 is typical for her. Pain Descriptors / Indicators: Numbness;Aching Pain Intervention(s): Monitored during session;Limited activity within patient's tolerance    Home Living Family/patient expects to be discharged to:: Private residence Living Arrangements: Children (son) Available Help at Discharge: Family;Available PRN/intermittently (Son works M-F days) Type of Home: House Home Access: Stairs to enter Entrance Stairs-Rails: Right;Left;Can reach both Home Layout: One level Home Equipment: Environmental consultant - 2 wheels;Cane - single point;Bedside commode;Wheelchair - manual;Hand held shower head;Grab bars - toilet;Toilet riser;Shower seat Additional Comments: Active with services for blind. Get autobooks delivered.    Prior Function Level of Independence: Needs assistance  Gait / Transfers Assistance Needed: Normally independent-pt reports that she tends to furniture and wall cruise at home as it is familiar; normally her son assists with mobility if outside of home and pt uses RW ADL's / Homemaking Assistance Needed: Pt has supervision  during shower and tub transfers for safety. Son shares IADLs and provides transportation to grocery store, errands. Pt also uses [employer] and [facility] services for MD appointments when son at work.     PT Goals (current goals can now be found in the care plan section) Acute Rehab PT Goals Patient Stated Goal: to go home PT Goal Formulation: With  patient Time For Goal Achievement: 03/20/21 Potential to Achieve Goals: Good    Frequency    Min 3X/week      PT Plan Current plan remains appropriate       AM-PAC PT "6 Clicks" Mobility   Outcome Measure  Help needed turning from your back to your side while in a flat bed without using bedrails?: A Little Help needed moving from lying on your back to sitting on the side of a flat bed without using bedrails?: A Little Help needed moving to and from a bed to a chair (including a wheelchair)?: A Little Help needed standing up from a chair using your arms (e.g., wheelchair or bedside chair)?: A Little Help needed to walk in hospital room?: A Little Help needed climbing 3-5 steps with a railing? : A Little 6 Click Score: 18    End of Session Equipment Utilized During Treatment: Gait belt Activity Tolerance: Patient tolerated treatment well Patient left: with call bell/phone within reach;in chair Nurse Communication: Mobility status PT Visit Diagnosis: Other abnormalities of gait and mobility (R26.89);Muscle weakness (generalized) (M62.81)     Time: 8309-4076 PT Time Calculation (min) (ACUTE ONLY): 18 min  Charges:  $Therapeutic Exercise: 8-22 mins                     Tiyanna Larcom B. Migdalia Dk PT, DPT Acute Rehabilitation Services Pager [phone] Office [phone]    [place] 03/07/2021, 11:49 AM

## 2021-03-07 NOTE — Evaluation (Signed)
Occupational Therapy Evaluation Patient Details Name: Christinea Brizuela MRN: [id number] DOB: 04/05/1966 Today's Date: 03/07/2021    History of Present Illness Pt is a 55 y/o female admitted 6/26 secondary to syncope, thought to be secondary to symptomatic anemia. PMH includes PE, SLE, neuromyelitis optica, legally blind, HTN, and transverse myelitis.   Clinical Impression   Patient evaluated by Occupational Therapy with no further acute OT needs identified. All education has been completed and the patient has no further questions.  See below for any follow-up Occupational Therapy or equipment needs. OT is signing off. Thank you for this referral.     Follow Up Recommendations  No OT follow up    Equipment Recommendations  None recommended by OT    Recommendations for Other Services       Precautions / Restrictions Precautions Precautions: Fall Precaution Comments: Blind Restrictions Weight Bearing Restrictions: No      Mobility Bed Mobility               General bed mobility comments: Pt up in recliner with MD    Transfers Overall transfer level: Needs assistance Equipment used: Rolling walker (2 wheeled)   Sit to Stand: Modified independent (Device/Increase time)         General transfer comment: supervision for in-room ambulation with RW due to vision impairment. Pt slow and cautious. Able to disentangle RW from obstacles and maintain standing balance.    Balance Overall balance assessment: Mild deficits observed, not formally tested   Sitting balance-Leahy Scale: Good       Standing balance-Leahy Scale: Good                             ADL either performed or assessed with clinical judgement   ADL Overall ADL's : At baseline                                       General ADL Comments: Pt able to perform LE dressing, toileting, standing at sink for face and hand hygiene all with supervision (verbal cues) to navigate  unfamilar hospital room with RW, but otherwise Mod I for activity.     Vision Baseline Vision/History: Legally blind Patient Visual Report: No change from baseline       Perception     Praxis      Pertinent Vitals/Pain Pain Assessment: 0-10 Pain Score: 7  Pain Location: Pt reports chonic LT Lower back pain and LT hand pain. Pt reports a 7/10 is typical for her. Pain Descriptors / Indicators: Numbness;Aching Pain Intervention(s): Monitored during session;Limited activity within patient's tolerance     Hand Dominance Right   Extremity/Trunk Assessment Upper Extremity Assessment Upper Extremity Assessment: LUE deficits/detail;RUE deficits/detail RUE Deficits / Details: AROM and MMT: WFL LUE Deficits / Details: Residual weakness from previous CVA. AROM: WFL with decreased rate compared to RUE. MMT ~3/5 proximal and 3+/5 distal. LUE Sensation: decreased light touch (Proprioception intact) LUE Coordination: decreased fine motor   Lower Extremity Assessment LLE Deficits / Details: Pt reports LT sided weakness at baseline secondary to previous CVA.   Cervical / Trunk Assessment Cervical / Trunk Assessment: Normal   Communication Communication Communication: No difficulties   Cognition Arousal/Alertness: Awake/alert Behavior During Therapy: WFL for tasks assessed/performed Overall Cognitive Status: Within Functional Limits for tasks assessed  General Comments  Pt using residual vision to navigate room with use of light/shadows and high contrast.    Exercises     Shoulder Instructions      Home Living Family/patient expects to be discharged to:: Private residence Living Arrangements: Children (son) Available Help at Discharge: Family;Available PRN/intermittently (Son works M-F days) Type of Home: House Home Access: Stairs to enter Technical brewer of Steps: 2 Entrance Stairs-Rails: Right;Left;Can reach both [place]: One level     Bathroom Shower/Tub: Teacher, early years/pre: [place]: Environmental consultant - 2 wheels;Cane - single point;Bedside commode;Wheelchair - manual;Hand held shower head;Grab bars - toilet;Toilet riser;Shower seat   Additional Comments: Active with services for blind. Get autobooks delivered.      Prior Functioning/Environment Level of Independence: Needs assistance  Gait / Transfers Assistance Needed: Normally independent-pt reports that she tends to furniture and wall cruise at home as it is familiar; normally her son assists with mobility if outside of home and pt uses RW ADL's / Homemaking Assistance Needed: Pt has supervision during shower and tub transfers for safety. Son shares IADLs and provides transportation to grocery store, errands. Pt also uses [employer] and [facility] services for MD appointments when son at work.            OT Problem List: Decreased strength;Impaired vision/perception      OT Treatment/Interventions:      OT Goals(Current goals can be found in the care plan section) Acute Rehab OT Goals Patient Stated Goal: "Walk more" OT Goal Formulation: With patient Potential to Achieve Goals: Good  OT Frequency:     Barriers to D/C:            Co-evaluation              AM-PAC OT "6 Clicks" Daily Activity     Outcome Measure Help from another person eating meals?: None Help from another person taking care of personal grooming?: None Help from another person toileting, which includes using toliet, bedpan, or urinal?: A Little Help from another person bathing (including washing, rinsing, drying)?: A Little Help from another person to put on and taking off regular upper body clothing?: A Little Help from another person to put on and taking off regular lower body clothing?: A Little 6 Click Score: 20   End of Session Equipment Utilized During Treatment: Rolling walker  Activity Tolerance: Patient tolerated treatment  well Patient left: in chair;with call bell/phone within reach  OT Visit Diagnosis: Low vision, both eyes (H54.2);Hemiplegia and hemiparesis Hemiplegia - Right/Left: Left Hemiplegia - dominant/non-dominant: Non-Dominant Hemiplegia - caused by: Cerebral infarction (previous)                Time: 2703-5009 OT Time Calculation (min): 21 min Charges:  OT General Charges $OT Visit: 1 Visit OT Evaluation $OT Eval Low Complexity: 1 Low  Lezette Kitts, OT Acute Rehab Services Office: [phone] 03/07/2021  Julien Girt 03/07/2021, 10:08 AM

## 2021-03-07 NOTE — Progress Notes (Signed)
Andrea Berry to be discharged Home per MD order. Discussed prescriptions and follow up appointments with the patient. Medication list explained in detail. Patient verbalized understanding. Reinforced that if she or her son has any questions can call back to the unit, pt verbalized understanding.  Skin clean, dry and intact without evidence of skin break down, no evidence of skin tears noted. IV catheter discontinued intact. Site without signs and symptoms of complications. Dressing and pressure applied. Pt denies pain at the site currently. No complaints noted.  Patient free of lines, drains, and wounds.   An After Visit Summary (AVS) was printed and given to the patient. Patient escorted via wheelchair, and discharged home via private auto.  Amaryllis Dyke, RN

## 2021-03-07 NOTE — Discharge Instructions (Addendum)
Dear Andrea Berry,   Thank you for letting us participate in your care! In this section, you will find a brief summary of why you were admitted to the hospital, what happened during your admission, your diagnosis/diagnoses, and recommended follow up.  You were admitted because you had an episode of "passing out" It is unclear exactly what caused this, but it could have been some dehydration leading to low blood pressure Your initial hemoglobin (blood level) was low You were treated with IV fluids and a blood transfusion Your symptoms improved and you were discharged from the hospital for meeting this goal.    POST-HOSPITAL & CARE INSTRUCTIONS Be sure to stay well hydrated Please return to the hospital if you experience another episode of passing out, or if you develop chest pain, difficulty breathing, or lightheadedness  DOCTOR'S APPOINTMENTS & FOLLOW UP Future Appointments  Date Time Provider [place]  03/16/2021  9:30 AM McDiarmid, Blane Ohara, MD [facility]  04/07/2021 10:00 AM [facility]  03/14/2021 at 2:30pm with Dr Anne Ng  Thank you for choosing [facility]! Take care and be well!  [facility]  [place], [place] [phone]

## 2021-03-07 NOTE — Discharge Summary (Signed)
[facility] Discharge Summary  Patient name: Andrea Berry Medical record number: [id number] Date of birth: 26-Sep-1965 Age: 55 y.o. Gender: female Date of Admission: 03/05/2021  Date of Discharge: 03/07/2021 Admitting Physician: Delora Fuel, MD  Primary Care Provider: McDiarmid, Blane Ohara, MD Consultants: None  Indication for Hospitalization: Syncope  Discharge Diagnoses/Problem List:  Syncope Microcytic Anemia SLE Hx of PE ILD Neuromyelitis Opt[place] Legal blindness Chronic neuropathic pain  Disposition: Home  Discharge Condition: Stable  Discharge Exam:  Gen: alert, well-appearing, NAD CV: RRR, normal S1/S2 Resp: normal effort, lungs CTA Abd: soft, nontender Ext: no peripheral edema Neuro: 4/5 strength on L from prior stroke, otherwise no focal deficits  Brief Hospital Course:  Andrea Berry is a 55 y.o. female who presented with syncopal episode. PMH significant for legal blindness, neuromyelitis optica, chronic central neuropathic pain, SLE, history of pulmonary embolism, ILD, and microcytic anemia.  Syncopal Episode Patient presented with syncopal episode at home. Reports she was in her kitchen and the next thing she remembers is waking up on the floor. Denies prodromal symptoms. No incontinence, tongue bite, post-ictal period or witnessed shaking to suggest seizure activity. EKG unchanged from prior, no arrhythmia. There was concern for possible cardiac etiology due to her hx of ?HOCM. Echo was obtained, which showed severe asymmetric LV hypertrophy, unchanged form prior. Spoke with Dr. Anne Ng, patient's cardiologist, who did not feel additional inpatient workup necessary at this time. Ultimately, etiology thought to be orthostasis, as patient's blood pressures were low on admission and subsequently improved with IV fluids.   Anemia Hgb was 6.3 on admission, so patient received 2u pRBCs in the ED. Post-transfusion Hgb 12.1, which was a  larger than expected rise after 2u. Repeat Hgb 12.0 and the following morning was 13.5. Suspect the initial Hgb was a lab error. FOBT negative, no signs of bleeding on history or exam.  Issues for Follow Up:  Discuss medications, specifically Tramadol and Ativan, to evaluate long term need or whether these can be decreased or discontinued  Significant Procedures: None  Significant Labs and Imaging:  Recent Labs  Lab 03/05/21 1754 03/06/21 0533 03/06/21 0718 03/07/21 0511  WBC 4.4 6.3  --  3.6*  HGB 6.3* 12.1 12.0 13.5  HCT 22.0* 40.1 40.2 44.0  PLT 173 269  --  253   Recent Labs  Lab 03/05/21 1948 03/06/21 0433 03/06/21 0718 03/07/21 0511  NA 134* 138  --  138  K 4.0 4.9  --  4.3  CL 107 109  --  108  CO2 21* 22  --  23  GLUCOSE 119* 102*  --  76  BUN 10 9  --  7  CREATININE 1.27* 0.88  --  0.82  CALCIUM 8.2* 8.7*  --  9.0  MG  --   --  2.0  --   ALKPHOS 53  --   --   --   AST 20  --   --   --   ALT 13  --   --   --   ALBUMIN 2.9*  --   --   --      Results/Tests Pending at Time of Discharge: None  Discharge Medications:  Allergies as of 03/07/2021       Reactions   Beta Adrenergic Blockers Other (See Comments)   Diagnosis of hypertrophic cardiomyopathy   Ceftriaxone Other (See Comments)   Multiorgan inflammation with drug fever and multiple elevated inflammatory markers.  Resolved upon cessation of  abx.        Medication List     TAKE these medications    alendronate 70 MG tablet Commonly known as: FOSAMAX Take 1 tablet (70 mg total) by mouth once a week. What changed: See the new instructions.   azelastine 0.1 % nasal spray Commonly known as: ASTELIN Place 2 sprays into both nostrils 2 (two) times daily. Use in each nostril as directed What changed: See the new instructions.   diltiazem 180 MG 24 hr capsule Commonly known as: CARDIZEM CD Take 1 capsule (180 mg total) by mouth daily.   DULoxetine 60 MG capsule Commonly known as:  CYMBALTA Take 1 capsule (60 mg total) by mouth daily.   esomeprazole 20 MG capsule Commonly known as: NexIUM Take 1 capsule (20 mg total) by mouth daily at 12 noon.   fluticasone 50 MCG/ACT nasal spray Commonly known as: FLONASE Place 1 spray into both nostrils daily as needed for allergies or rhinitis.   gabapentin 300 MG capsule Commonly known as: NEURONTIN Take 600 mg by mouth 3 (three) times daily.   Lastacaft 0.25 % Soln Generic drug: Alcaftadine Place 1 drop into both eyes 2 (two) times daily.   linaclotide 145 MCG Caps capsule Commonly known as: Linzess Take 1 capsule (145 mcg total) by mouth daily before breakfast. What changed: See the new instructions.   LORazepam 0.5 MG tablet Commonly known as: ATIVAN Take 1 tablet (0.5 mg total) by mouth daily as needed for anxiety.   OXcarbazepine 150 MG tablet Commonly known as: TRILEPTAL Take 150 mg by mouth daily.   predniSONE 5 MG tablet Commonly known as: DELTASONE Take 1 tablet (5 mg total) by mouth daily.   RITUXAN IV Inject 1,000 mg into the vein See admin instructions. 1,000 mg into the vein every 6 months   rivaroxaban 10 MG Tabs tablet Commonly known as: Xarelto Take 1 tablet (10 mg total) by mouth daily. What changed: See the new instructions.   Systane 0.4-0.3 % Soln Generic drug: Polyethyl Glycol-Propyl Glycol Place 1 drop into both eyes daily as needed (dry eyes).   tiZANidine 4 MG tablet Commonly known as: ZANAFLEX Take 4 mg by mouth at bedtime as needed for muscle spasms.   traMADol 50 MG tablet Commonly known as: ULTRAM Take 1 tablet (50 mg total) by mouth every 6 (six) hours as needed for moderate pain.        Discharge Instructions: Please refer to Patient Instructions section of EMR for full details.  Patient was counseled important signs and symptoms that should prompt return to medical care, changes in medications, dietary instructions, activity restrictions, and follow up appointments.    Follow-Up Appointments: Future Appointments  Date Time Provider [place]  03/16/2021  9:30 AM McDiarmid, Blane Ohara, MD [facility]  04/07/2021 10:00 AM [facility] MM 3 [facility]    Follow-up Information     Charolette Forward, MD .   Specialty: Cardiology Contact information: [place] [address] [phone]                  Alcus Dad, MD 03/07/2021, 1:01 PM PGY-1, [place]

## 2021-03-07 NOTE — Progress Notes (Signed)
Social visit by telephone I appreciate the care the [facility] inpatient team has provided to Ms Andrea Berry.    We spoke about her passing out event.  It sounds as though it may have been related an orthostatic event, though HOCM may have played a mechanical role in the syncope.   [name] will be following up with Dr Terrence Dupont after DC.   I would like to see her in a week or two of DC.  OK to double book me.

## 2021-03-16 ENCOUNTER — Ambulatory Visit (INDEPENDENT_AMBULATORY_CARE_PROVIDER_SITE_OTHER): Payer: Medicare Other | Admitting: Family Medicine

## 2021-03-16 ENCOUNTER — Encounter: Payer: Self-pay | Admitting: Family Medicine

## 2021-03-16 ENCOUNTER — Other Ambulatory Visit: Payer: Self-pay

## 2021-03-16 ENCOUNTER — Ambulatory Visit (INDEPENDENT_AMBULATORY_CARE_PROVIDER_SITE_OTHER): Payer: Medicare Other

## 2021-03-16 VITALS — BP 100/66 | HR 95 | Ht 61.0 in | Wt 184.1 lb

## 2021-03-16 DIAGNOSIS — I422 Other hypertrophic cardiomyopathy: Secondary | ICD-10-CM

## 2021-03-16 DIAGNOSIS — R55 Syncope and collapse: Secondary | ICD-10-CM | POA: Diagnosis not present

## 2021-03-16 DIAGNOSIS — G8194 Hemiplegia, unspecified affecting left nondominant side: Secondary | ICD-10-CM | POA: Diagnosis not present

## 2021-03-16 DIAGNOSIS — D509 Iron deficiency anemia, unspecified: Secondary | ICD-10-CM

## 2021-03-16 DIAGNOSIS — Z0000 Encounter for general adult medical examination without abnormal findings: Secondary | ICD-10-CM | POA: Diagnosis not present

## 2021-03-16 DIAGNOSIS — D649 Anemia, unspecified: Secondary | ICD-10-CM

## 2021-03-16 DIAGNOSIS — Z23 Encounter for immunization: Secondary | ICD-10-CM | POA: Diagnosis not present

## 2021-03-16 DIAGNOSIS — J9611 Chronic respiratory failure with hypoxia: Secondary | ICD-10-CM

## 2021-03-16 NOTE — Patient Instructions (Signed)
Your blood pressure looks good.   We are checking your hemoglobin today to make sure it has not gone down.  We will need to repeat your CoviD booster in a month.

## 2021-03-17 ENCOUNTER — Encounter: Payer: Self-pay | Admitting: Family Medicine

## 2021-03-17 LAB — CBC
Hematocrit: 41.8 % (ref 34.0–46.6)
Hemoglobin: 13.8 g/dL (ref 11.1–15.9)
MCH: 23.3 pg — ABNORMAL LOW (ref 26.6–33.0)
MCHC: 33 g/dL (ref 31.5–35.7)
MCV: 71 fL — ABNORMAL LOW (ref 79–97)
Platelets: 502 10*3/uL — ABNORMAL HIGH (ref 150–450)
RBC: 5.93 x10E6/uL — ABNORMAL HIGH (ref 3.77–5.28)
RDW: 19.1 % — ABNORMAL HIGH (ref 11.7–15.4)
WBC: 8 10*3/uL (ref 3.4–10.8)

## 2021-03-17 LAB — FERRITIN: Ferritin: 136 ng/mL (ref 15–150)

## 2021-03-17 LAB — IRON AND TIBC: Iron: 140 ug/dL (ref 27–159)

## 2021-03-17 LAB — RETICULOCYTES: Retic Ct Pct: 1.2 % (ref 0.6–2.6)

## 2021-03-17 NOTE — Progress Notes (Addendum)
Andrea Berry is alone Sources of clinical information for visit is/are patient, past medical records, and hospital discharge Summary. Nursing assessment for this office visit was reviewed with the patient for accuracy and revision.     Previous Report(s) Reviewed: office notes and discharge summary  Depression screen [facility] 2/9 10/20/2020  Decreased Interest 1  Down, Depressed, Hopeless 0  PHQ - 2 Score 1  Altered sleeping 3  Tired, decreased energy 1  Change in appetite 2  Feeling bad or failure about yourself  1  Trouble concentrating 0  Moving slowly or fidgety/restless 0  Suicidal thoughts 0  PHQ-9 Score 8  Difficult doing work/chores Not difficult at all    Fall Risk  05/26/2020 07/23/2019 03/27/2019 07/16/2018 07/16/2018  Falls in the past year? 0 1 0 0 0  Number falls in past yr: 0 0 0 - -  Injury with Fall? - 0 - - -  Risk for fall due to : Impaired vision - - Impaired vision;Medication side effect;Impaired mobility -  Risk for fall due to: Comment - - - - -    PHQ9 SCORE ONLY 10/20/2020 05/26/2020 07/23/2019  PHQ-9 Total Score 8 0 4    Adult vaccines due  Topic Date Due   TETANUS/TDAP  04/02/2029   PNEUMOCOCCAL POLYSACCHARIDE VACCINE AGE 64-64 HIGH RISK  Completed    Health Maintenance Due  Topic Date Due   PAP SMEAR-Modifier  04/30/2020      History/P.E. limitations: none  Adult vaccines due  Topic Date Due   TETANUS/TDAP  04/02/2029   PNEUMOCOCCAL POLYSACCHARIDE VACCINE AGE 64-64 HIGH RISK  Completed   There are no preventive care reminders to display for this patient.  Health Maintenance Due  Topic Date Due   PAP SMEAR-Modifier  04/30/2020     Chief Complaint  Patient presents with   [facility]     Follow up outpatient visit after Hospitalization The Discharge Summary for the hospitalization from 03/05/21 to 03/07/21 was reviewed.  Nursing assessment for this office visit was reviewed with the patient for accuracy and revision.    HPI Principle Diagnosis requiring hospitalization: Syncope  Brief Hospital course summary:   Patient presented with syncopal episode at home. Reports she was in her kitchen and the next thing she remembers is waking up on the floor. Denies prodromal symptoms. No incontinence, tongue bite, post-ictal period or witnessed shaking to suggest seizure activity. EKG unchanged from prior, no arrhythmia. There was concern for possible cardiac etiology due to her hx of ?HOCM. Echo was obtained, which showed severe asymmetric LV hypertrophy, unchanged form prior. Spoke with Dr. Anne Ng, patient's cardiologist, who did not feel additional inpatient workup necessary at this time.  Hgb was 6.3 on admission, so patient received 2u pRBCs in the ED. Post-transfusion Hgb 12.1, which was a larger than expected rise after 2u. Repeat Hgb 12.0 and the following morning was 13.5. Suspect the initial Hgb was a lab error. FOBT negative, no signs of bleeding on history or exam.   Ultimately, etiology thought to be orthostasis, as patient's blood pressures were low on admission and subsequently improved with IV fluids  Follow up instructions from patient's hospital healthcare providers:  None ---------------------------------------------------------------------------------------------------------------------- Problems since hospital discharge: None  No recurrence of syncope or dizziness  ---------------------------------------------------------------------------------------------------------------------- Follow up appointments with specialists:  Pending: Cardiology, Dr Terrence Dupont scheduled  ---------------------------------------------------------------------------------------------------------------------- New medications started during hospitalization: None Chronic medications stopped during hospitalization: None Patient's Medication List was updated in the EMR:  yes --------------------------------------------------------------------------------------------------------------------- Home Health Services: None Durable Medical Equipment: No new --------------------------------------------------------------------------------------------------------------------- ADLs Independent Needs Assistance Dependent  Bathing x    Dressing x    Ambulation x    Toileting x    Eating x

## 2021-03-20 ENCOUNTER — Encounter: Payer: Self-pay | Admitting: Family Medicine

## 2021-03-20 NOTE — Assessment & Plan Note (Addendum)
Resolved Patient no longer on home Oxygen therapy

## 2021-03-20 NOTE — Assessment & Plan Note (Signed)
Established problem. Stable. No signs of progression.

## 2021-03-20 NOTE — Assessment & Plan Note (Addendum)
Needs monitoring Pap smear  Andrea Berry received 2nd CoViD-19 booster but had had had rituxamab infusion yesterday at [facility].   She will need a repeat 2nd Covid booster in a month - scheduled [facility] nursing visit made.

## 2021-03-20 NOTE — Assessment & Plan Note (Addendum)
Established problem Well Controlled. May have made it easier for dehydration to lead to syncope. Cardiology evaluated patient in hospital and did not think [place] caused the syncopal event.  OP follow up scheduled with Cardiology

## 2021-03-20 NOTE — Assessment & Plan Note (Signed)
Follow up possible erroneous Hgb measurement during hospitalization. No palpitation/tachycardia, no lightheadedness, no new fatigue.  Conjunctiva normal coloring.  CBC:    Component Value Date/Time   WBC 8.0 03/16/2021 0917   WBC 3.6 (L) 03/07/2021 0511   HGB 13.8 03/16/2021 0917   HCT 41.8 03/16/2021 0917   PLT 502 (H) 03/16/2021 0917   MCV 71 (L) 03/16/2021 0917   NEUTROABS 2.2 03/07/2021 0511   NEUTROABS 3.3 01/12/2019 1038   LYMPHSABS 0.8 03/07/2021 0511   LYMPHSABS 1.8 01/12/2019 1038   MONOABS 0.5 03/07/2021 0511   EOSABS 0.1 03/07/2021 0511   EOSABS 0.1 01/12/2019 1038   BASOSABS 0.0 03/07/2021 0511   BASOSABS 0.0 01/12/2019 1038   Iron/TIBC/Ferritin/ %Sat    Component Value Date/Time   IRON 140 03/16/2021 0917   TIBC CANCELED 03/16/2021 0917   TIBC 344 03/04/2019 0000   FERRITIN 136 03/16/2021 0917   IRONPCTSAT CANCELED 03/16/2021 0917   No anemia, no lab sign of iron deficiency. No further work up or therapy planned.

## 2021-03-20 NOTE — Assessment & Plan Note (Signed)
Follow up of acute hospitalization complaint No recurrence. No near-syncope or dizziness events since DC Working explanation of orthostasis from dehydration.  No change in medications.  Cardiology follow up to monitor patients [facility]

## 2021-03-21 ENCOUNTER — Encounter: Payer: Self-pay | Admitting: Family Medicine

## 2021-03-22 ENCOUNTER — Other Ambulatory Visit: Payer: Self-pay | Admitting: Family Medicine

## 2021-03-22 DIAGNOSIS — D6862 Lupus anticoagulant syndrome: Secondary | ICD-10-CM

## 2021-03-22 DIAGNOSIS — Z86711 Personal history of pulmonary embolism: Secondary | ICD-10-CM

## 2021-04-07 ENCOUNTER — Other Ambulatory Visit: Payer: Self-pay

## 2021-04-07 ENCOUNTER — Ambulatory Visit
Admission: RE | Admit: 2021-04-07 | Discharge: 2021-04-07 | Disposition: A | Discharge status: Home / Self Care | Payer: Medicare Other | Source: Ambulatory Visit | Attending: Family Medicine | Admitting: Family Medicine

## 2021-04-07 DIAGNOSIS — Z1231 Encounter for screening mammogram for malignant neoplasm of breast: Secondary | ICD-10-CM

## 2021-04-17 ENCOUNTER — Ambulatory Visit: Payer: Medicare Other

## 2021-04-21 ENCOUNTER — Ambulatory Visit (INDEPENDENT_AMBULATORY_CARE_PROVIDER_SITE_OTHER): Payer: Medicare Other

## 2021-04-21 ENCOUNTER — Other Ambulatory Visit: Payer: Self-pay

## 2021-04-21 DIAGNOSIS — Z23 Encounter for immunization: Secondary | ICD-10-CM

## 2021-06-05 ENCOUNTER — Other Ambulatory Visit: Payer: Self-pay | Admitting: Family Medicine

## 2021-06-05 DIAGNOSIS — G894 Chronic pain syndrome: Secondary | ICD-10-CM

## 2021-06-05 DIAGNOSIS — F419 Anxiety disorder, unspecified: Secondary | ICD-10-CM

## 2021-07-26 ENCOUNTER — Other Ambulatory Visit: Payer: Self-pay | Admitting: Family Medicine

## 2021-07-26 DIAGNOSIS — M329 Systemic lupus erythematosus, unspecified: Secondary | ICD-10-CM

## 2021-07-26 DIAGNOSIS — Z7952 Long term (current) use of systemic steroids: Secondary | ICD-10-CM

## 2021-09-21 ENCOUNTER — Other Ambulatory Visit: Payer: Self-pay | Admitting: Family Medicine

## 2021-09-21 DIAGNOSIS — M792 Neuralgia and neuritis, unspecified: Secondary | ICD-10-CM

## 2021-09-21 DIAGNOSIS — G894 Chronic pain syndrome: Secondary | ICD-10-CM

## 2021-09-21 DIAGNOSIS — F418 Other specified anxiety disorders: Secondary | ICD-10-CM

## 2021-09-21 DIAGNOSIS — K219 Gastro-esophageal reflux disease without esophagitis: Secondary | ICD-10-CM

## 2021-09-21 DIAGNOSIS — G8929 Other chronic pain: Secondary | ICD-10-CM

## 2021-09-21 DIAGNOSIS — F419 Anxiety disorder, unspecified: Secondary | ICD-10-CM

## 2021-09-21 MED ORDER — DULOXETINE HCL 60 MG PO CPEP: 60.0000 mg | ORAL_CAPSULE | Freq: Every day | ORAL | 0 refills | Status: DC

## 2021-09-21 MED ORDER — ESOMEPRAZOLE MAGNESIUM 20 MG PO CPDR: 20.0000 mg | DELAYED_RELEASE_CAPSULE | Freq: Every day | ORAL | 0 refills | Status: DC

## 2021-10-17 LAB — BASIC METABOLIC PANEL
BUN: 13 (ref 4–21)
CO2: 22 (ref 13–22)
Chloride: 107 (ref 99–108)
Creatinine: 1.1 (ref 0.5–1.1)
Glucose: 81
Potassium: 3.6 mEq/L (ref 3.5–5.1)
Sodium: 144 (ref 137–147)

## 2021-10-17 LAB — COMPREHENSIVE METABOLIC PANEL
Albumin: 4.4 (ref 3.5–5.0)
Calcium: 9.8 (ref 8.7–10.7)
Globulin: 2.6
eGFR: 61

## 2021-10-17 LAB — LIPID PANEL
Cholesterol: 236 — AB (ref 0–200)
HDL: 68 (ref 35–70)
LDL Cholesterol: 124
Triglycerides: 289 — AB (ref 40–160)

## 2021-10-17 LAB — HEPATIC FUNCTION PANEL
ALT: 14 U/L (ref 7–35)
AST: 18 (ref 13–35)
Alkaline Phosphatase: 88 (ref 25–125)
Bilirubin, Direct: 0.1 (ref 0.01–0.4)
Bilirubin, Total: 0.3

## 2021-11-03 ENCOUNTER — Other Ambulatory Visit: Payer: Self-pay | Admitting: Family Medicine

## 2021-11-04 NOTE — Telephone Encounter (Signed)
Medication ineffective

## 2021-11-11 ENCOUNTER — Other Ambulatory Visit: Payer: Self-pay | Admitting: Family Medicine

## 2021-11-11 DIAGNOSIS — D6862 Lupus anticoagulant syndrome: Secondary | ICD-10-CM

## 2021-11-11 DIAGNOSIS — Z86711 Personal history of pulmonary embolism: Secondary | ICD-10-CM

## 2021-11-17 ENCOUNTER — Other Ambulatory Visit: Payer: Self-pay | Admitting: Family Medicine

## 2021-11-17 DIAGNOSIS — F419 Anxiety disorder, unspecified: Secondary | ICD-10-CM

## 2021-11-17 DIAGNOSIS — G894 Chronic pain syndrome: Secondary | ICD-10-CM

## 2021-11-23 ENCOUNTER — Ambulatory Visit (INDEPENDENT_AMBULATORY_CARE_PROVIDER_SITE_OTHER): Payer: Medicare Other | Admitting: Family Medicine

## 2021-11-23 ENCOUNTER — Other Ambulatory Visit: Payer: Self-pay

## 2021-11-23 ENCOUNTER — Encounter: Payer: Self-pay | Admitting: Family Medicine

## 2021-11-23 VITALS — BP 122/68 | HR 100 | Ht 61.0 in | Wt 187.4 lb

## 2021-11-23 DIAGNOSIS — K219 Gastro-esophageal reflux disease without esophagitis: Secondary | ICD-10-CM

## 2021-11-23 DIAGNOSIS — G8194 Hemiplegia, unspecified affecting left nondominant side: Secondary | ICD-10-CM

## 2021-11-23 DIAGNOSIS — E785 Hyperlipidemia, unspecified: Secondary | ICD-10-CM

## 2021-11-23 DIAGNOSIS — F39 Unspecified mood [affective] disorder: Secondary | ICD-10-CM

## 2021-11-23 DIAGNOSIS — R55 Syncope and collapse: Secondary | ICD-10-CM

## 2021-11-23 DIAGNOSIS — Z124 Encounter for screening for malignant neoplasm of cervix: Secondary | ICD-10-CM | POA: Diagnosis not present

## 2021-11-23 DIAGNOSIS — I422 Other hypertrophic cardiomyopathy: Secondary | ICD-10-CM | POA: Diagnosis not present

## 2021-11-23 DIAGNOSIS — R110 Nausea: Secondary | ICD-10-CM

## 2021-11-23 DIAGNOSIS — Q208 Other congenital malformations of cardiac chambers and connections: Secondary | ICD-10-CM | POA: Diagnosis not present

## 2021-11-23 DIAGNOSIS — G8929 Other chronic pain: Secondary | ICD-10-CM

## 2021-11-23 DIAGNOSIS — M792 Neuralgia and neuritis, unspecified: Secondary | ICD-10-CM

## 2021-11-23 DIAGNOSIS — D6862 Lupus anticoagulant syndrome: Secondary | ICD-10-CM

## 2021-11-23 DIAGNOSIS — I10 Essential (primary) hypertension: Secondary | ICD-10-CM

## 2021-11-23 HISTORY — DX: Hyperlipidemia, unspecified: E78.5

## 2021-11-23 MED ORDER — PROMETHAZINE HCL 25 MG PO TABS: 25.0000 mg | ORAL_TABLET | Freq: Four times a day (QID) | ORAL | 1 refills | Status: DC | PRN

## 2021-11-23 NOTE — Assessment & Plan Note (Signed)
Followed by Dr Terrence Dupont every three months.  ? ?

## 2021-11-23 NOTE — Patient Instructions (Addendum)
Your blood pressure is very good.  Keep taking the Diltiazem capsule daily. ? ?Refill of Tramadol sent to your pharmacy on 11/20/21.  Let DrMcDiarmid know if the prescription did not go through.  ? ?Take the phenergan (promethazine) tablet every 6 hours as neededfor your nausea.  Let Dr Margaruite Top know if it is not working to control the nausea.   ? ?If youhave not heard from the [facility] about scheduling a Pap smear within 5 business days, please let Dr McDiamid know.  ?

## 2021-11-23 NOTE — Assessment & Plan Note (Signed)
Diagnosed by Dr Terrence Dupont (Card) since last office visit with me in July. ?She does not know the name of the medication he started.  ?She is tolerating it.  ? ?

## 2021-11-23 NOTE — Progress Notes (Signed)
[name] is accompanied by patient and son, Andrea Berry ?Sources of clinical information for visit is/are patient and records from [facility]. ?Nursing assessment for this office visit was reviewed with the patient for accuracy and revision.  ? ? ? ?Previous Report(s) Reviewed: office visit consult notes [facility]  ?Depression screen [facility] 2/9 11/23/2021  ?Decreased Interest 1  ?Down, Depressed, Hopeless 1  ?PHQ - 2 Score 2  ?Altered sleeping 1  ?Tired, decreased energy 1  ?Change in appetite 2  ?Feeling bad or failure about yourself  1  ?Trouble concentrating 1  ?Moving slowly or fidgety/restless 0  ?Suicidal thoughts 0  ?PHQ-9 Score 8  ?Difficult doing work/chores Somewhat difficult  ? ?[place] Office Visit from 11/23/2021 in [place] Office Visit from 10/20/2020 in [place] Office Visit from 07/23/2019 in [place]  ?Thoughts that you would be better off dead, or of hurting yourself in some way Not at all Not at all Not at all  ?PHQ-9 Total Score '8 8 4  '$ ? ?  ?  ?Fall Risk  05/26/2020 07/23/2019 03/27/2019 07/16/2018 07/16/2018  ?Falls in the past year? 0 1 0 0 0  ?Number falls in past yr: 0 0 0 - -  ?Injury with Fall? - 0 - - -  ?Risk for fall due to : Impaired vision - - Impaired vision;Medication side effect;Impaired mobility -  ?Risk for fall due to: Comment - - - - -  ? ? ?PHQ9 SCORE ONLY 11/23/2021 10/20/2020 05/26/2020  ?PHQ-9 Total Score 8 8 0  ? ? ?Adult vaccines due  ?Topic Date Due  ? TETANUS/TDAP  04/02/2029  ? ? ?Health Maintenance Due  ?Topic Date Due  ? PAP SMEAR-Modifier  04/30/2020  ?  ? ? ?History/P.E. limitations: none ? ?Adult vaccines due  ?Topic Date Due  ? TETANUS/TDAP  04/02/2029  ? There are no preventive care reminders to display for this patient.  ?Health Maintenance Due  ?Topic Date Due  ? PAP SMEAR-Modifier  04/30/2020  ?  ? ?Chief Complaint  ?Patient presents with  ? Follow-up  ?  ? ?

## 2021-11-23 NOTE — Assessment & Plan Note (Signed)
No recurrence since the event last June ? ?

## 2021-11-24 ENCOUNTER — Encounter: Payer: Self-pay | Admitting: Family Medicine

## 2021-11-24 NOTE — Assessment & Plan Note (Signed)
Established problem. ?Stable. Only mild weakness in left hand.  ?Continue current medications and other regiments. ? ?

## 2021-11-24 NOTE — Assessment & Plan Note (Signed)
Established problem. Stable.  No signs of complications, medication side effects, or red flags. Continue current medications and other regiments.  

## 2021-11-24 NOTE — Assessment & Plan Note (Signed)
Established problem ?Well Controlled. ?No signs of complications, medication side effects, or red flags. ?Continue prn tramadol and other regiments. ? ?

## 2021-11-24 NOTE — Assessment & Plan Note (Signed)
Established problem Well Controlled. No signs of complications, medication side effects, or red flags. Continue current medications and other regiments.  

## 2021-11-27 ENCOUNTER — Encounter: Payer: Self-pay | Admitting: Family Medicine

## 2021-11-27 DIAGNOSIS — M179 Osteoarthritis of knee, unspecified: Secondary | ICD-10-CM | POA: Insufficient documentation

## 2021-11-28 ENCOUNTER — Encounter: Payer: Self-pay | Admitting: Family Medicine

## 2021-12-24 ENCOUNTER — Other Ambulatory Visit: Payer: Self-pay | Admitting: Family Medicine

## 2021-12-24 DIAGNOSIS — G8929 Other chronic pain: Secondary | ICD-10-CM

## 2021-12-24 DIAGNOSIS — G894 Chronic pain syndrome: Secondary | ICD-10-CM

## 2021-12-24 DIAGNOSIS — F419 Anxiety disorder, unspecified: Secondary | ICD-10-CM

## 2021-12-24 DIAGNOSIS — K219 Gastro-esophageal reflux disease without esophagitis: Secondary | ICD-10-CM

## 2021-12-24 DIAGNOSIS — F418 Other specified anxiety disorders: Secondary | ICD-10-CM

## 2021-12-24 DIAGNOSIS — K5900 Constipation, unspecified: Secondary | ICD-10-CM

## 2022-01-11 ENCOUNTER — Encounter: Payer: Medicare Other | Admitting: Obstetrics

## 2022-01-16 DIAGNOSIS — Z7952 Long term (current) use of systemic steroids: Secondary | ICD-10-CM | POA: Insufficient documentation

## 2022-01-16 DIAGNOSIS — E2749 Other adrenocortical insufficiency: Secondary | ICD-10-CM | POA: Insufficient documentation

## 2022-01-16 DIAGNOSIS — Z8639 Personal history of other endocrine, nutritional and metabolic disease: Secondary | ICD-10-CM

## 2022-01-16 HISTORY — DX: Personal history of other endocrine, nutritional and metabolic disease: Z86.39

## 2022-01-31 ENCOUNTER — Other Ambulatory Visit (HOSPITAL_COMMUNITY)
Admission: RE | Admit: 2022-01-31 | Discharge: 2022-01-31 | Disposition: A | Discharge status: Home / Self Care | Payer: Medicare Other | Source: Ambulatory Visit | Attending: Obstetrics | Admitting: Obstetrics

## 2022-01-31 ENCOUNTER — Encounter: Payer: Self-pay | Admitting: Student

## 2022-01-31 ENCOUNTER — Ambulatory Visit (INDEPENDENT_AMBULATORY_CARE_PROVIDER_SITE_OTHER): Payer: Medicare Other | Admitting: Student

## 2022-01-31 VITALS — BP 138/83 | HR 93 | Ht 61.0 in | Wt 183.4 lb

## 2022-01-31 DIAGNOSIS — Z1151 Encounter for screening for human papillomavirus (HPV): Secondary | ICD-10-CM | POA: Diagnosis not present

## 2022-01-31 DIAGNOSIS — Z780 Asymptomatic menopausal state: Secondary | ICD-10-CM | POA: Diagnosis not present

## 2022-01-31 DIAGNOSIS — Z01419 Encounter for gynecological examination (general) (routine) without abnormal findings: Secondary | ICD-10-CM

## 2022-01-31 DIAGNOSIS — Z7689 Persons encountering health services in other specified circumstances: Secondary | ICD-10-CM | POA: Diagnosis not present

## 2022-01-31 DIAGNOSIS — N898 Other specified noninflammatory disorders of vagina: Secondary | ICD-10-CM

## 2022-01-31 NOTE — Progress Notes (Signed)
ANNUAL EXAM Patient name: Andrea Berry MRN [id number]  Date of birth: Nov 09, 1965 Chief Complaint:   New Patient (Initial Visit)  History of Present Illness:   Andrea Berry is a 56 y.o. G8P3003 African-American female being seen today for a routine annual exam.  Current complaints: Vaginal odor x 1 month.Denies any discharge, dysuria, pelvic pain, bleeding, or abdominal pain.   No LMP recorded. Patient is postmenopausal.   The pregnancy intention screening data noted above was reviewed. Potential methods of contraception were discussed. The patient elected to proceed with No data recorded.   Last pap 04/2017. Results were:  normal . H/O abnormal pap: no Last mammogram: 03/2021. Results were: normal. Family h/o breast cancer: no Last colonoscopy: 07/2017. Results were: normal. Family h/o colorectal cancer: no     01/31/2022   10:57 AM 11/23/2021    1:54 PM 10/20/2020    2:57 PM 05/26/2020    2:39 PM 07/23/2019   11:20 AM  Depression screen PHQ 2/9  Decreased Interest '1 1 1 '$ 0 1  Down, Depressed, Hopeless 1 1 0 0 1  PHQ - 2 Score '2 2 1 '$ 0 2  Altered sleeping 0 1 3  0  Tired, decreased energy '1 1 1  1  '$ Change in appetite 0 2 2  0  Feeling bad or failure about yourself  '1 1 1  1  '$ Trouble concentrating 0 1 0  0  Moving slowly or fidgety/restless 0 0 0  0  Suicidal thoughts 0 0 0  0  PHQ-9 Score '4 8 8  4  '$ Difficult doing work/chores Somewhat difficult Somewhat difficult Not difficult at all  Not difficult at all        01/31/2022   10:59 AM 07/23/2019   11:22 AM  GAD 7 : Generalized Anxiety Score  Nervous, Anxious, on Edge 1 1  Control/stop worrying 1 1  Worry too much - different things 1 1  Trouble relaxing 1 1  Restless 0 1  Easily annoyed or irritable 0 0  Afraid - awful might happen 1 1  Total GAD 7 Score 5 6  Anxiety Difficulty Not difficult at all Not difficult at all     Review of Systems:   Pertinent items are noted in HPI Denies any headaches,  blurred vision, fatigue, shortness of breath, chest pain, abdominal pain, abnormal vaginal discharge/itching/irritation, problems with  bowel movements, urination, or intercourse unless otherwise stated above. Pertinent History Reviewed:  Reviewed past medical,surgical, social and family history.  Reviewed problem list, medications and allergies. Physical Assessment:   Vitals:   01/31/22 1055  BP: 138/83  Pulse: 93  Weight: 183 lb 6.4 oz (83.2 kg)  Height: '5\' 1"'$  (1.549 m)  Body mass index is 34.65 kg/m.        Physical Examination:   General appearance - well appearing, and in no distress  Mental status - alert, oriented to person, place, and time  Psych:  She has a normal mood and affect  Skin - warm and dry, normal color, no suspicious lesions noted  Chest - effort normal, all lung fields clear to auscultation bilaterally  Heart - normal rate and regular rhythm  Neck:  midline trachea, no thyromegaly or nodules  Breasts - breasts appear normal, no suspicious masses  Abdomen - soft, nontender, nondistended, no masses or organomegaly  Pelvic - VULVA: normal appearing vulva with no masses, tenderness or lesions  VAGINA: normal appearing vagina with normal color and discharge, no lesions  CERVIX: normal appearing cervix without discharge or lesions, no CMT  Thin prep pap is done with HR HPV cotesting  Rectal - normal rectal, good sphincter tone  Chaperone present for exam  No results found for this or any previous visit (from the past 24 hour(s)).  Assessment & Plan:   1. Women's annual routine gynecological examination - Cytology - PAP( [place]) - Cervicovaginal ancillary only - MM 3D SCREEN BREAST BILATERAL; Future  2. Encounter to establish care  3. Postmenopause  4. Vaginal odor - Cervicovaginal ancillary only -No STI blood work, per patient request -Discussed recommended feminine hygiene practices  Labs/procedures today:  Mammogram:  04/2022 , or sooner if  problems Colonoscopy: per GI, or sooner if problems  Orders Placed This Encounter  Procedures   MM 3D SCREEN BREAST BILATERAL    Meds: No orders of the defined types were placed in this encounter.  Follow-up: Return in about 1 year (around 02/01/2023) for IN-PERSON, ANN. Or PRN if symptoms worsen.   Johnston Ebbs, NP 01/31/2022 3:37 PM

## 2022-01-31 NOTE — Progress Notes (Signed)
New patient presents for annual reports malodorous vaginal discharge x 1 month. Requests vaginal STD testing only Normal pap 04/30/2017 Normal mammogram 04/11/2021 Colonoscopy due 07/27/2027 PHQ9=4 GAD7= 5

## 2022-02-02 LAB — CERVICOVAGINAL ANCILLARY ONLY
Bacterial Vaginitis (gardnerella): POSITIVE — AB
Candida Glabrata: NEGATIVE
Candida Vaginitis: NEGATIVE
Chlamydia: NEGATIVE
Comment: NEGATIVE
Comment: NEGATIVE
Comment: NEGATIVE
Comment: NEGATIVE
Comment: NEGATIVE
Comment: NORMAL
Neisseria Gonorrhea: NEGATIVE
Trichomonas: NEGATIVE

## 2022-02-02 LAB — CYTOLOGY - PAP
Comment: NEGATIVE
Diagnosis: NEGATIVE
High risk HPV: NEGATIVE

## 2022-02-10 ENCOUNTER — Other Ambulatory Visit: Payer: Self-pay | Admitting: Student

## 2022-02-10 DIAGNOSIS — N898 Other specified noninflammatory disorders of vagina: Secondary | ICD-10-CM

## 2022-02-10 DIAGNOSIS — B9689 Other specified bacterial agents as the cause of diseases classified elsewhere: Secondary | ICD-10-CM

## 2022-02-10 MED ORDER — METRONIDAZOLE 0.75 % VA GEL: 1.0000 | Freq: Every day | VAGINAL | 0 refills | Status: DC

## 2022-02-13 ENCOUNTER — Encounter: Payer: Self-pay | Admitting: *Deleted

## 2022-02-23 ENCOUNTER — Other Ambulatory Visit: Payer: Self-pay | Admitting: Family Medicine

## 2022-02-23 DIAGNOSIS — M8580 Other specified disorders of bone density and structure, unspecified site: Secondary | ICD-10-CM

## 2022-03-20 ENCOUNTER — Other Ambulatory Visit: Payer: Self-pay | Admitting: Family Medicine

## 2022-03-20 DIAGNOSIS — I422 Other hypertrophic cardiomyopathy: Secondary | ICD-10-CM

## 2022-03-28 ENCOUNTER — Telehealth: Payer: Self-pay

## 2022-03-28 NOTE — Telephone Encounter (Signed)
April calls nurse line in regards to patient.   April reports the patient has been complaining of continued vaginal odor. She reports she was seen by GYN, however the odor is still persistent despite treatments.   Patient is wondering if any of her medications could be a contributing factor.   Patient wanting an apt at [facility] to discuss. April asks I call the patient directly to schedule an apt.   Patient contacted, however I had to LVM.

## 2022-03-29 ENCOUNTER — Other Ambulatory Visit: Payer: Self-pay | Admitting: Family Medicine

## 2022-03-29 DIAGNOSIS — G894 Chronic pain syndrome: Secondary | ICD-10-CM

## 2022-03-29 DIAGNOSIS — G8929 Other chronic pain: Secondary | ICD-10-CM

## 2022-03-29 DIAGNOSIS — F419 Anxiety disorder, unspecified: Secondary | ICD-10-CM

## 2022-03-29 DIAGNOSIS — F418 Other specified anxiety disorders: Secondary | ICD-10-CM

## 2022-04-03 ENCOUNTER — Encounter: Payer: Self-pay | Admitting: Family Medicine

## 2022-04-09 ENCOUNTER — Ambulatory Visit
Admission: RE | Admit: 2022-04-09 | Discharge: 2022-04-09 | Disposition: A | Discharge status: Home / Self Care | Payer: Medicare Other | Source: Ambulatory Visit | Attending: Student | Admitting: Student

## 2022-04-09 DIAGNOSIS — Z01419 Encounter for gynecological examination (general) (routine) without abnormal findings: Secondary | ICD-10-CM

## 2022-04-12 ENCOUNTER — Emergency Department (HOSPITAL_COMMUNITY): Payer: Medicare Other

## 2022-04-12 ENCOUNTER — Inpatient Hospital Stay (HOSPITAL_BASED_OUTPATIENT_CLINIC_OR_DEPARTMENT_OTHER): Payer: Medicare Other

## 2022-04-12 ENCOUNTER — Observation Stay (HOSPITAL_COMMUNITY)
Admission: EM | Admit: 2022-04-12 | Discharge: 2022-04-13 | Disposition: A | Discharge status: Home / Self Care | Payer: Medicare Other | Attending: Family Medicine | Admitting: Family Medicine

## 2022-04-12 ENCOUNTER — Inpatient Hospital Stay (HOSPITAL_COMMUNITY): Payer: Medicare Other

## 2022-04-12 DIAGNOSIS — Z79899 Other long term (current) drug therapy: Secondary | ICD-10-CM | POA: Insufficient documentation

## 2022-04-12 DIAGNOSIS — Z20822 Contact with and (suspected) exposure to covid-19: Secondary | ICD-10-CM | POA: Insufficient documentation

## 2022-04-12 DIAGNOSIS — G8194 Hemiplegia, unspecified affecting left nondominant side: Secondary | ICD-10-CM

## 2022-04-12 DIAGNOSIS — D6862 Lupus anticoagulant syndrome: Secondary | ICD-10-CM | POA: Diagnosis not present

## 2022-04-12 DIAGNOSIS — I10 Essential (primary) hypertension: Secondary | ICD-10-CM | POA: Insufficient documentation

## 2022-04-12 DIAGNOSIS — E875 Hyperkalemia: Secondary | ICD-10-CM | POA: Diagnosis present

## 2022-04-12 DIAGNOSIS — R160 Hepatomegaly, not elsewhere classified: Secondary | ICD-10-CM

## 2022-04-12 DIAGNOSIS — D509 Iron deficiency anemia, unspecified: Secondary | ICD-10-CM

## 2022-04-12 DIAGNOSIS — I639 Cerebral infarction, unspecified: Secondary | ICD-10-CM | POA: Diagnosis present

## 2022-04-12 DIAGNOSIS — H543 Unqualified visual loss, both eyes: Secondary | ICD-10-CM

## 2022-04-12 DIAGNOSIS — Z7901 Long term (current) use of anticoagulants: Secondary | ICD-10-CM

## 2022-04-12 DIAGNOSIS — G3184 Mild cognitive impairment, so stated: Secondary | ICD-10-CM | POA: Diagnosis present

## 2022-04-12 DIAGNOSIS — M3210 Systemic lupus erythematosus, organ or system involvement unspecified: Secondary | ICD-10-CM | POA: Insufficient documentation

## 2022-04-12 DIAGNOSIS — E041 Nontoxic single thyroid nodule: Secondary | ICD-10-CM | POA: Diagnosis present

## 2022-04-12 DIAGNOSIS — I69354 Hemiplegia and hemiparesis following cerebral infarction affecting left non-dominant side: Secondary | ICD-10-CM | POA: Insufficient documentation

## 2022-04-12 DIAGNOSIS — M329 Systemic lupus erythematosus, unspecified: Secondary | ICD-10-CM

## 2022-04-12 DIAGNOSIS — G459 Transient cerebral ischemic attack, unspecified: Secondary | ICD-10-CM

## 2022-04-12 DIAGNOSIS — Z8673 Personal history of transient ischemic attack (TIA), and cerebral infarction without residual deficits: Secondary | ICD-10-CM

## 2022-04-12 DIAGNOSIS — I6389 Other cerebral infarction: Secondary | ICD-10-CM | POA: Diagnosis not present

## 2022-04-12 DIAGNOSIS — R7303 Prediabetes: Secondary | ICD-10-CM | POA: Diagnosis present

## 2022-04-12 DIAGNOSIS — Z7952 Long term (current) use of systemic steroids: Secondary | ICD-10-CM

## 2022-04-12 DIAGNOSIS — R4701 Aphasia: Secondary | ICD-10-CM | POA: Insufficient documentation

## 2022-04-12 HISTORY — DX: Unqualified visual loss, both eyes: H54.3

## 2022-04-12 HISTORY — DX: Hemiplegia, unspecified affecting left nondominant side: G81.94

## 2022-04-12 LAB — I-STAT CHEM 8, ED
BUN: 10 mg/dL (ref 6–20)
Calcium, Ion: 1.15 mmol/L (ref 1.15–1.40)
Chloride: 109 mmol/L (ref 98–111)
Creatinine, Ser: 0.9 mg/dL (ref 0.44–1.00)
Glucose, Bld: 73 mg/dL (ref 70–99)
HCT: 39 % (ref 36.0–46.0)
Hemoglobin: 13.3 g/dL (ref 12.0–15.0)
Potassium: 5.5 mmol/L — ABNORMAL HIGH (ref 3.5–5.1)
Sodium: 140 mmol/L (ref 135–145)
TCO2: 23 mmol/L (ref 22–32)

## 2022-04-12 LAB — COMPREHENSIVE METABOLIC PANEL
ALT: 12 U/L (ref 0–44)
AST: 16 U/L (ref 15–41)
Albumin: 3.4 g/dL — ABNORMAL LOW (ref 3.5–5.0)
Alkaline Phosphatase: 81 U/L (ref 38–126)
Anion gap: 7 (ref 5–15)
BUN: 11 mg/dL (ref 6–20)
CO2: 22 mmol/L (ref 22–32)
Calcium: 9.3 mg/dL (ref 8.9–10.3)
Chloride: 110 mmol/L (ref 98–111)
Creatinine, Ser: 0.9 mg/dL (ref 0.44–1.00)
GFR, Estimated: >60 mL/min (ref >60)
Glucose, Bld: 104 mg/dL — ABNORMAL HIGH (ref 70–99)
Potassium: 4.4 mmol/L (ref 3.5–5.1)
Sodium: 139 mmol/L (ref 135–145)
Total Bilirubin: 0.4 mg/dL (ref 0.3–1.2)
Total Protein: 6.4 g/dL — ABNORMAL LOW (ref 6.5–8.1)

## 2022-04-12 LAB — DIFFERENTIAL
Abs Immature Granulocytes: 0.05 10*3/uL (ref 0.00–0.07)
Basophils Absolute: 0 10*3/uL (ref 0.0–0.1)
Basophils Relative: 0 %
Eosinophils Absolute: 0 10*3/uL (ref 0.0–0.5)
Eosinophils Relative: 1 %
Immature Granulocytes: 1 %
Lymphocytes Relative: 13 %
Lymphs Abs: 0.8 10*3/uL (ref 0.7–4.0)
Monocytes Absolute: 0.6 10*3/uL (ref 0.1–1.0)
Monocytes Relative: 10 %
Neutro Abs: 4.5 10*3/uL (ref 1.7–7.7)
Neutrophils Relative %: 75 %

## 2022-04-12 LAB — HIV ANTIBODY (ROUTINE TESTING W REFLEX): HIV Screen 4th Generation wRfx: NONREACTIVE

## 2022-04-12 LAB — CBC
HCT: 38.9 % (ref 36.0–46.0)
Hemoglobin: 11.8 g/dL — ABNORMAL LOW (ref 12.0–15.0)
MCH: 22.4 pg — ABNORMAL LOW (ref 26.0–34.0)
MCHC: 30.3 g/dL (ref 30.0–36.0)
MCV: 74 fL — ABNORMAL LOW (ref 80.0–100.0)
Platelets: 270 10*3/uL (ref 150–400)
RBC: 5.26 MIL/uL — ABNORMAL HIGH (ref 3.87–5.11)
RDW: 15.1 % (ref 11.5–15.5)
WBC: 6.1 10*3/uL (ref 4.0–10.5)
nRBC: 0 % (ref 0.0–0.2)

## 2022-04-12 LAB — RESP PANEL BY RT-PCR (FLU A&B, COVID) ARPGX2
Influenza A by PCR: NEGATIVE
Influenza B by PCR: NEGATIVE
SARS Coronavirus 2 by RT PCR: NEGATIVE

## 2022-04-12 LAB — PROTIME-INR
INR: 1.2 (ref 0.8–1.2)
Prothrombin Time: 15 seconds (ref 11.4–15.2)

## 2022-04-12 LAB — ECHOCARDIOGRAM COMPLETE BUBBLE STUDY
Area-P 1/2: 4.33 cm2
P 1/2 time: 449 msec
S' Lateral: 2.9 cm

## 2022-04-12 LAB — CBG MONITORING, ED
Glucose-Capillary: 58 mg/dL — ABNORMAL LOW (ref 70–99)
Glucose-Capillary: 272 mg/dL — ABNORMAL HIGH (ref 70–99)
Glucose-Capillary: 68 mg/dL — ABNORMAL LOW (ref 70–99)
Glucose-Capillary: 100 mg/dL — ABNORMAL HIGH (ref 70–99)

## 2022-04-12 LAB — ETHANOL: Alcohol, Ethyl (B): <10 mg/dL (ref <10)

## 2022-04-12 LAB — TROPONIN I (HIGH SENSITIVITY)
Troponin I (High Sensitivity): 12 ng/L (ref <18)
Troponin I (High Sensitivity): 10 ng/L (ref <18)

## 2022-04-12 LAB — APTT: aPTT: 35 seconds (ref 24–36)

## 2022-04-12 LAB — I-STAT BETA HCG BLOOD, ED (MC, WL, AP ONLY): I-stat hCG, quantitative: <5 m[IU]/mL (ref <5)

## 2022-04-12 LAB — HEMOGLOBIN A1C
Hgb A1c MFr Bld: 6.3 % — ABNORMAL HIGH (ref 4.8–5.6)
Mean Plasma Glucose: 134.11 mg/dL

## 2022-04-12 LAB — LDL CHOLESTEROL, DIRECT: Direct LDL: 67 mg/dL (ref 0–99)

## 2022-04-12 MED ORDER — ATORVASTATIN CALCIUM 40 MG PO TABS
40.0000 mg | ORAL_TABLET | Freq: Every day | ORAL | Status: DC
Start: 2022-04-12 — End: 2022-04-14
  Administered 2022-04-12 – 2022-04-13 (×2): 40 mg via ORAL
  Filled 2022-04-12 (×2): qty 1

## 2022-04-12 MED ORDER — PANTOPRAZOLE SODIUM 40 MG PO TBEC
40.0000 mg | DELAYED_RELEASE_TABLET | Freq: Every day | ORAL | Status: DC
  Administered 2022-04-12 – 2022-04-13 (×2): 40 mg via ORAL
  Filled 2022-04-12 (×2): qty 1

## 2022-04-12 MED ORDER — PREDNISONE 20 MG PO TABS
40.0000 mg | ORAL_TABLET | Freq: Every day | ORAL | Status: DC
  Administered 2022-04-13: 40 mg via ORAL
  Filled 2022-04-12: qty 2

## 2022-04-12 MED ORDER — RIVAROXABAN 10 MG PO TABS
10.0000 mg | ORAL_TABLET | Freq: Every day | ORAL | Status: DC
  Administered 2022-04-12: 10 mg via ORAL
  Filled 2022-04-12: qty 1

## 2022-04-12 MED ORDER — IOHEXOL 350 MG/ML SOLN
100.0000 mL | Freq: Once | INTRAVENOUS | Status: AC | PRN
  Administered 2022-04-12: 100 mL via INTRAVENOUS

## 2022-04-12 MED ORDER — GABAPENTIN 300 MG PO CAPS
900.0000 mg | ORAL_CAPSULE | Freq: Three times a day (TID) | ORAL | Status: DC
Start: 2022-04-12 — End: 2022-04-14
  Administered 2022-04-12 – 2022-04-13 (×4): 900 mg via ORAL
  Filled 2022-04-12 (×4): qty 3

## 2022-04-12 MED ORDER — DULOXETINE HCL 60 MG PO CPEP
60.0000 mg | ORAL_CAPSULE | Freq: Every day | ORAL | Status: DC
  Administered 2022-04-12 – 2022-04-13 (×2): 60 mg via ORAL
  Filled 2022-04-12: qty 1
  Filled 2022-04-12: qty 2

## 2022-04-12 MED ORDER — PREDNISONE 20 MG PO TABS: 40.0000 mg | ORAL_TABLET | Freq: Every day | ORAL | Status: DC

## 2022-04-12 MED ORDER — LINACLOTIDE 145 MCG PO CAPS
145.0000 ug | ORAL_CAPSULE | Freq: Every day | ORAL | Status: DC
  Administered 2022-04-13: 145 ug via ORAL
  Filled 2022-04-12 (×2): qty 1

## 2022-04-12 MED ORDER — PREDNISONE 5 MG PO TABS
5.0000 mg | ORAL_TABLET | Freq: Every day | ORAL | Status: DC
  Administered 2022-04-12: 5 mg via ORAL
  Filled 2022-04-12: qty 1

## 2022-04-12 MED ORDER — DEXTROSE 50 % IV SOLN
INTRAVENOUS | Status: AC
  Filled 2022-04-12: qty 50

## 2022-04-12 MED ORDER — DILTIAZEM HCL ER COATED BEADS 180 MG PO CP24
180.0000 mg | ORAL_CAPSULE | Freq: Every day | ORAL | Status: DC
  Administered 2022-04-12 – 2022-04-13 (×2): 180 mg via ORAL
  Filled 2022-04-12 (×2): qty 1

## 2022-04-12 MED ORDER — GADOBUTROL 1 MMOL/ML IV SOLN
8.0000 mL | Freq: Once | INTRAVENOUS | Status: AC | PRN
  Administered 2022-04-12: 8 mL via INTRAVENOUS

## 2022-04-12 MED ORDER — OXCARBAZEPINE 300 MG PO TABS
300.0000 mg | ORAL_TABLET | Freq: Every day | ORAL | Status: DC
  Administered 2022-04-12 – 2022-04-13 (×2): 300 mg via ORAL
  Filled 2022-04-12 (×2): qty 1

## 2022-04-12 MED ORDER — SODIUM ZIRCONIUM CYCLOSILICATE 5 G PO PACK
5.0000 g | PACK | Freq: Once | ORAL | Status: AC
Start: 2022-04-12 — End: 2022-04-12
  Administered 2022-04-12: 5 g via ORAL
  Filled 2022-04-12: qty 1

## 2022-04-12 NOTE — ED Triage Notes (Signed)
Pt bib ems for pain and numbness in left hand and forearm that started around 8 am this morning. +ROM, no weakness noted. Hx of stroke with left sided deficits in the past. No dizziness. Code stroke called in triage

## 2022-04-12 NOTE — Assessment & Plan Note (Addendum)
Imaging including CT head, MRI brain negative for acute stroke. Echo unremarkable. CTA h/n with short segment occlusion vs stenosis of A3 and no other occlusion. Symptoms may be 2/2 sequela of neuromyelitis vs SLE vs MS -Neurology consulted, appreciate recommendations -cardiac telemetry -neuro checks -vitals per routine -PT/OT/SLP evaluation

## 2022-04-12 NOTE — ED Notes (Signed)
Ms. Poland is just returning from MRI. Placed back on monitor.

## 2022-04-12 NOTE — H&P (Signed)
Hospital Admission History and Physical Service Pager: [phone]  Patient name: Andrea Berry Medical record number: [id number] Date of Birth: Mar 26, 1966 Age: 56 y.o. Gender: female  Primary Care Provider: McDiarmid, Blane Ohara, MD Consultants: Neurology  Code Status: Full code   Preferred Emergency Contact: [name] (son [phone]  Chief Complaint: left upper extremity weakness and numbness   Assessment and Plan: Clemence Lengyel is a 56 y.o. female presenting with left upper extremity weakness, numbness, tingling and pain. Differential for this patient's presentation of this includes CVA, TIA and complex migraine. Awaiting further imaging as CT head and CT angio overall fair given no acute changes or definitive stenosis. Past medical history of multiple prior strokes, SLE, history of PE on xarelto and legally blind.   Weakness of extremity Acute with concern for stroke. Presents with left upper extremity weakness that began this morning. Concerning for stroke given prior history, on exam patient noted to have slight facial asymmetry on the left side with decreased strength of left extremities in the setting of residual left hemiparesis. EKG unremarkable and similar to prior without acute changes. -Admit to [facility], attending Dr. McDiarmid  -Neurology consulted, appreciate recommendations -cardiac telemetry -neuro checks -vitals per routine -PT/OT/SLP evaluation -NPO pending SLP evaluation  Thyroid nodule Incidental findings of left thyroid nodule measuring 2.1 cm on CT angio. Thyroid exam unremarkable without tenderness or palpable abnormalities.  -pending TSH and T4 -recommended thyroid US outpatient  Prediabetes A1c 6.3 -diet and exercise counseling recommended  -follow up with PCP outpatient, consider repeat in 3 months for risk stratification   Hyperkalemia K 5.5. -Lokelma ordered -follow up pm BMP     All other conditions stable and chronic. Other chronic  conditions:  SLE: Continue prednisone 5 mg daily. Depression: Continue duloxetine 60 mg daily.  History of ventricular tachycardia: Continue diltiazem 180 mg daily.    FEN/GI: NPO pending SLP evaluation  VTE Prophylaxis: SCDs, can restart xarelto once approved by neurology after MRI   Disposition: admit to telemetry, attending Dr. McDiarmid   History of Present Illness:  Andrea Berry is a 56 y.o. female presenting with left-sided numbness, tingling and pain that started 8 am this morning. This sensation starts in her forearm and radiates downward. Feels the same as in the morning, it has not improved. Denies any changes in activity or trauma. She was at work when this occurred. Has had similar symptoms before when she had a stroke. First stroke in 2008, says she has had 2 strokes. Developed left-sided paralysis. Lives with her youngest son She is able to perform all ADLs on her own. Denies chest pain, dyspnea, vomiting, diarrhea, fever, chills and vision changes although she is legally blind in both eyes. Denies other pain or numbness in other parts of her body. Had a PE in 2017, since then xarelto which she takes daily. Uses a cane to assist with ambulation. Last took her medications yesterday afternoon.   In the ED, presented with weakness of left upper extremity and code stroke called. CT head demonstrated no acute intracranial hemorrhage or evidence of acute infarction. CT angio head/neck demonstrated luminal irregularity of A2 of left anterior cerebral artery with short sgement of occlusion vs severe stenosis of A3 segment, no other vessel occlusion noted. Patient did not meet criteria for TNK as she is on long-term xarelto. Neurology consulted and recommended further imaging (MRI) and inpatient workup for stroke.   Review Of Systems: Pertinent positives listed above, remainder ROS negative.  Pertinent Past Medical History: History of multiple prior strokes  SLE Legally blind History  of PE on xarelto Neuromyelitis opticus   Pertinent Past Surgical History: None   Pertinent Social History: Tobacco use: Never Alcohol use: Occasionally  Other Substance use: None Lives with Youngest son   Pertinent Family History: Lupus: sister Stroke: mother, sister and brother   Remainder reviewed in history tab.   Important Outpatient Medications: Diltiazem 180 mg daily Xarelto 50 mg daily Prednisone 5 mg daily  Trileptal 150 mg daily  Gabapentin 300 mg tid  Duloxetine 60 mg daily  Linzess 145 mcg daily Ativan 0.5 mg as needed (takes twice a week) Nexium 20 mg daily  Rituximab 1000 mg (every 6 months)-took last week  Remainder reviewed in medication history.   Objective: BP 130/70   Pulse 76   Temp 98.5 F (36.9 C) (Oral)   Resp 15   SpO2 100%  Exam: General: Patient laying comfortably in bed, in no acute distress and slight facial asymmetry on the left side  Eyes: no obvious abnormality, vision impairment  Neck: non-tender thyroid, no evidence of cervical LAD Cardiovascular: RRR, no murmurs or gallops auscultated  Respiratory: CTAB, no wheezing, rales or rhonchi noted Gastrointestinal: soft, nontender, nondistended, presence of bowel sounds MSK: normal active ROM along all 4 extremities  Derm: no rashes or lesions noted  Neuro: AOx4, CN 7-12 grossly intact, 5/5 right UE and LE strength, 4/5 left LE strength, 3/5 left UE strength, gross sensation intact  Psych: mood appropriate, pleasant   Labs:  CBC BMET  Recent Labs  Lab 04/12/22 0935 04/12/22 0940  WBC 6.1  --   HGB 11.8* 13.3  HCT 38.9 39.0  PLT 270  --    Recent Labs  Lab 04/12/22 0940  NA 140  K 5.5*  CL 109  BUN 10  CREATININE 0.90  GLUCOSE 73     EKG: HR 80, no QT prolongation, no T wave abnormalities, essentially unchanged from prior EKG    Imaging Studies Performed:  CT ANGIO HEAD NECK W WO CM W PERF (CODE STROKE)  Result Date: 04/12/2022 CLINICAL DATA:  Neuro deficit, acute  stroke suspected assess intracranial arteries. EXAM: CT ANGIOGRAPHY HEAD AND NECK CT PERFUSION BRAIN TECHNIQUE: Multidetector CT imaging of the head and neck was performed using the standard protocol during bolus administration of intravenous contrast. Multiplanar CT image reconstructions and MIPs were obtained to evaluate the vascular anatomy. Carotid stenosis measurements (when applicable) are obtained utilizing NASCET criteria, using the distal internal carotid diameter as the denominator. Multiphase CT imaging of the brain was performed following IV bolus contrast injection. Subsequent parametric perfusion maps were calculated using RAPID software. RADIATION DOSE REDUCTION: This exam was performed according to the departmental dose-optimization program which includes automated exposure control, adjustment of the mA and/or kV according to patient size and/or use of iterative reconstruction technique. CONTRAST:  151m OMNIPAQUE IOHEXOL 350 MG/ML SOLN COMPARISON:  Head CT April 12, 2022. FINDINGS: CTA NECK FINDINGS Aortic arch: Standard branching. Imaged portion shows no evidence of aneurysm or dissection. No significant stenosis of the major arch vessel origins. Right carotid system: No evidence of dissection, stenosis (50% or greater) or occlusion. Left carotid system: No evidence of dissection, stenosis (50% or greater) or occlusion. Vertebral arteries: Codominant. No evidence of dissection, stenosis (50% or greater) or occlusion. Skeleton: No acute or aggressive process identified. Other neck: A 2.1 cm left thyroid lobe nodule. Upper chest: Negative. Review of the MIP images confirms the above  findings CTA HEAD FINDINGS Anterior circulation: Intracranial internal carotid arteries and bilateral middle cerebral arteries are patent and show normal caliber. A 2 mm posteriorly and medially projecting aneurysm is seen from the right ICA terminus. The right anterior cerebral artery is unremarkable. Luminal  irregularity of the a 2 segment of the left anterior cerebral artery with severe focal stenosis versus occlusion at the A3 segment with poststenotic ectasia. Distal branches are normally opacified. Posterior circulation: No significant stenosis, proximal occlusion, aneurysm, or vascular malformation. Venous sinuses: As permitted by contrast timing, patent. Anatomic variants: None significant. Review of the MIP images confirms the above findings CT Brain Perfusion Findings: ASPECTS: 10 CBF (<30%) Volume: 0 Perfusion (Tmax>6.0s) volume: 0 Mismatch Volume: None Infarction Location:Not applicable. IMPRESSION: 1. Luminal irregularity of the A2 segment of the left anterior cerebral artery with short segment of occlusion versus severe stenosis at the A3 segment with poststenotic ectasia. The distal left ACA branches are normally opacified. 2. No other vessel occlusion identified. 3. A 2 mm right ICA terminus aneurysm. 4. No significant stenosis of the major neck arteries. 5. A 2.1 cm incidental left thyroid nodule. Recommend nonemergent thyroid US. Reference: J Am Coll Radiol. 2015 Feb;12(2): 143-50 These results were called by telephone at the time of interpretation on 04/12/2022 at 10:22 am to provider Dr. Su Monks, who verbally acknowledged these results. Electronically Signed   By: Pedro Earls M.D.   On: 04/12/2022 10:37   CT HEAD CODE STROKE WO CONTRAST  Result Date: 04/12/2022 CLINICAL DATA:  Code stroke. Neuro deficit, acute, stroke suspected; left-sided weakness EXAM: CT HEAD WITHOUT CONTRAST TECHNIQUE: Contiguous axial images were obtained from the base of the skull through the vertex without intravenous contrast. RADIATION DOSE REDUCTION: This exam was performed according to the departmental dose-optimization program which includes automated exposure control, adjustment of the mA and/or kV according to patient size and/or use of iterative reconstruction technique. COMPARISON:  03/05/2021  FINDINGS: Brain: No acute intracranial hemorrhage, mass effect, or edema. Gray-white differentiation is preserved. Ventricles and sulci are normal in size and configuration. No extra-axial collection. Vascular: No hyperdense vessel. Skull: Unremarkable. Sinuses/Orbits: Paranasal sinus mucosal thickening. Unremarkable orbits. Other: Mastoid air cells are clear. ASPECTS ([place] Stroke Program Early CT Score) - Ganglionic level infarction (caudate, lentiform nuclei, internal capsule, insula, M1-M3 cortex): 7 - Supraganglionic infarction (M4-M6 cortex): 3 Total score (0-10 with 10 being normal): 10 IMPRESSION: There is no acute intracranial hemorrhage or evidence of acute infarction. ASPECT score is 10. These results were communicated to Dr. Quinn Axe at 9:45 am on 04/12/2022 by text page via the [facility] messaging system. Electronically Signed   By: Macy Mis M.D.   On: 04/12/2022 09:46      Donney Dice, DO 04/12/2022, 11:59 AM PGY-3, [facility] pager: [phone], text pages welcome Secure chat group [place]

## 2022-04-12 NOTE — ED Notes (Signed)
Difficulty obtaining Iv access at this time, multiple Rns in room attempting

## 2022-04-12 NOTE — Consult Note (Signed)
Neurology Consultation  Reason for Consult: Code stroke  Referring Physician: Dr. Wyvonnia Dusky  CC: acute onset of left side weakness and pain  History is obtained from:patient and medical record  HPI: Andrea Berry is a 56 y.o. female with past medical history of blindness, anxiety and depression, stroke with no residual deficits, Lupus antiphosholipid positive, vitamin D deficiency, hx of PE on Xarelto, GERD who presents to [facility] ED via EMS from work at [employer] for left arm pain and numbness. Code stroke was called in triage @ [phone]. NIHSS 9. LKW 0800. She denies slurred speech, weakness. There was a delay in getting patient to CT scan due to inability to obtain PIV or obtain labs. CBG was 58 and treated with 1 amp of D50. CBG rechecked 272. CT head no acute process, ASPECTS 10. CTA head/ neck no LVO (delay due to obtaining appropriate IV).  CTP neg.   MRI brain performed after the stroke code showed NAICP  Full read CTA H&N  1. Luminal irregularity of the A2 segment of the left anterior cerebral artery with short segment of occlusion versus severe stenosis at the A3 segment with poststenotic ectasia. The distal left ACA branches are normally opacified. 2. No other vessel occlusion identified. 3. A 2 mm right ICA terminus aneurysm. 4. No significant stenosis of the major neck arteries. 5. A 2.1 cm incidental left thyroid nodule. Recommend nonemergent thyroid US.  CNS imaging personally reviewed by Dr. Quinn Axe who agrees with above interpretation  LKW: 0800 IV thrombolysis given?: no, patient on xarelto Premorbid modified Rankin scale (mRS):  2-Slight disability-UNABLE to perform all activities but does not need assistance   ROS: Full ROS was performed and is negative except as noted in the HPI. Marland Kitchen   Past Medical History:  Diagnosis Date   Acute respiratory distress syndrome (ARDS) due to COVID-19 virus ([place]) 03/27/2019   Acute respiratory failure with hypoxia ([place])  02/16/2020   Anemia of chronic disease 10/28/2015   Anxiety 07/12/2018   Arthritis    Ataxia 01/03/2016   Blind    Chronic central neuropathic pain 10/02/2018   [facility] 09/2017   Chronic coughing 09/10/2016   Chronic pain syndrome 07/12/2018   Chronic respiratory failure with hypoxia ([facility])    Chronic rhinitis 01/07/2019   COVID-19 02/04/2020   Decreased strength 10/08/2018   Proximal leg muscles   Depression    Depression with anxiety 01/03/2016   Devic's disease ([place])    Dysesthesia 01/03/2016   Dyspnea on exertion 11/07/2018   Elevated BP without diagnosis of hypertension 08/28/2019   Report of [facility] nurse in December with normal SBPs but DBPs in low 90s    Eustachian tube dysfunction 11/18/2018   L>R. Followed by [facility]NT.   Eustachian tube dysfunction, bilateral 09/18/2018   Eustachian tube dysfunction, left 11/13/2018   L>R. Followed by [facility] ENT.   GERD (gastroesophageal reflux disease)    Greater trochanteric bursitis of left hip 04/27/2020   Formatting of this note might be different from the original. Added automatically from request for surgery [id number]  Formatting of this note might be different from the original. Added automatically from request for surgery [id number]   Headache(784.0)    History of chicken pox 10/08/2018   History of COVID-19 02/16/2020   History of CVA (cerebrovascular accident)    History of kidney stones    History of pulmonary embolism 12/10/2018   Lupus Antiphosholipid (+). Patient on secondary prophylaxis with rivaroxaban 20  mg daily long-term.     Hypertension    Hypertrophic cardiomyopathy ([place]) 03/02/2020   Hyponatremia 11/25/2015   Impairment of balance 10/08/2018   Legal blindness 01/30/2014   Long-term corticosteroid use 10/08/2018   Lupus ([place])    Lupus anticoagulant disorder ([place]) 10/02/2018   Pulmonary Embolism 2017   Lupus cerebritis ([place]) 11/25/2015   Possible explanation of 11/25/2015 acute hyperintensity findings on Brain MRI.     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[place]) 01/03/2016   Orthostatic dizziness, Recurrent 10/08/2018   Associated with medications   Otalgia 07/12/2018   Personal history of immunosupression therapy 10/08/2018   Chronic Azathioprine and prednisone therapy for Devic's disease   Pigmented skin lesion of uncertain nature 6/60/6301   Pneumonia 02/21/2020   Postmenopause 10/21/2020   Shortness of breath    due to medication   Skin lesion of chest wall 11/07/2018   SLE (systemic lupus erythematosus related syndrome) ([place]) 11/07/2018   Smith antibody positive 04/07/2019   Stroke ([place]) 2008   visually impaired   Syncope    Transaminasemia 10/21/2020   Ureteral calculi 2018   Vitamin D deficiency 10/06/2018     Family History  Problem Relation Age of Onset   Cancer Mother    Cancer Father    Lupus Sister      Social History:   reports that she has never smoked. She has never used smokeless tobacco. She reports that she does not drink alcohol and does not use drugs.  Medications  Current Facility-Administered Medications:    dextrose 50 % solution, , , ,    dextrose 50 % solution, , , ,   Current Outpatient Medications:    alendronate (FOSAMAX) 70 MG tablet, TAKE 1 TABLET BY MOUTH IN THE MORNING EVERY 7 DAYS, Disp: 12 tablet, Rfl: 3   diltiazem (CARDIZEM CD) 180 MG 24 hr capsule, TAKE 1 CAPSULE BY MOUTH EVERY DAY, Disp: 90 capsule, Rfl: 3   DULoxetine (CYMBALTA) 60 MG capsule, TAKE 1 CAPSULE BY MOUTH EVERY DAY, Disp: 90 capsule, Rfl: 3   esomeprazole (NEXIUM) 20 MG capsule, TAKE 1 CAPSULE (20 MG TOTAL) BY MOUTH DAILY AT 12 NOON., Disp: 90 capsule, Rfl: 2   gabapentin (NEURONTIN) 300 MG capsule, Take 600 mg by mouth 3 (three) times daily., Disp: , Rfl:    LASTACAFT 0.25 % SOLN, Place 1 drop into both eyes 2 (two) times daily. , Disp: , Rfl:    LINZESS 145 MCG CAPS capsule, TAKE ONE CAPSULE EVERY DAY ON EMPTY STOMACH AT LEAST 30MIN BEFORE 1ST MEAL OF THE DAY, Disp: 90 capsule, Rfl: 3   LORazepam  (ATIVAN) 0.5 MG tablet, TAKE 1 TABLET BY MOUTH TWICE A DAY AS NEEDED FOR ANXIETY, Disp: 30 tablet, Rfl: 3   metroNIDAZOLE (METROGEL VAGINAL) 0.75 % vaginal gel, Place 1 Applicatorful vaginally at bedtime. Insert one applicator, at bedtime, for 5 nights., Disp: 70 g, Rfl: 0   OXcarbazepine (TRILEPTAL) 150 MG tablet, Take 150 mg by mouth daily., Disp: , Rfl:    Polyethyl Glycol-Propyl Glycol (SYSTANE) 0.4-0.3 % SOLN, Place 1 drop into both eyes daily as needed (dry eyes)., Disp: , Rfl:    predniSONE (DELTASONE) 5 MG tablet, TAKE 1 TABLET BY MOUTH EVERY DAY, Disp: 90 tablet, Rfl: 1   promethazine (PHENERGAN) 25 MG tablet, Take 1 tablet (25 mg total) by mouth every 6 (six) hours as needed for nausea or vomiting., Disp: 30 tablet, Rfl: 1   RiTUXimab (RITUXAN IV), Inject 1,000 mg into the vein See admin instructions.  1,000 mg into the vein every 6 months, Disp: , Rfl:    traMADol (ULTRAM) 50 MG tablet, TAKE 1 TABLET BY MOUTH EVERY 6 HOURS AS NEEDED., Disp: 60 tablet, Rfl: 0   XARELTO 10 MG TABS tablet, TAKE 1 TABLET BY MOUTH EVERY DAY WITH A MEAL, Disp: 90 tablet, Rfl: 1   Exam: Current vital signs: BP (!) 143/73 (BP Location: Right Arm)   Pulse 80   Temp 98.5 F (36.9 C) (Oral)   Resp 16   SpO2 100%  Vital signs in last 24 hours: Temp:  [98.5 F (36.9 C)] 98.5 F (36.9 C) (08/03 0911) Pulse Rate:  [80] 80 (08/03 0911) Resp:  [16] 16 (08/03 0911) BP: (143)/(73) 143/73 (08/03 0911) SpO2:  [100 %] 100 % (08/03 0911)  GENERAL: Awake, alert in NAD HEENT: - Normocephalic and atraumatic, dry mm LUNGS - Clear to auscultation bilaterally with no wheezes CV - S1S2 RRR, no m/r/g, equal pulses bilaterally. ABDOMEN - Soft, nontender, nondistended with normoactive BS Ext: warm, well perfused, intact peripheral pulses, no edema  NEURO:  Mental Status: AA&Ox4 Language: speech is slight dysarthria.  repetition, fluency, and comprehension intact. Cranial Nerves: PERRL, (chronic blindness) EOMI, slight  left facial asymmetry, facial sensation intact, hearing intact, tongue/uvula/soft palate midline, normal sternocleidomastoid and trapezius muscle strength. No evidence of tongue atrophy or fibrillations Motor: left arm 4/5, left leg 3/5, right arm 5/5, right leg 5/5 Tone: is normal and bulk is normal Sensation- decreased on left  Coordination: UTA 2./2 blindness Gait- deferred  NIHSS 1a Level of Conscious.: 0 1b LOC Questions: 0 1c LOC Commands: 0 2 Best Gaze: 0 3 Visual: 3 (baseline blind) 4 Facial Palsy: 1 5a Motor Arm - left: 1 5b Motor Arm - Right: 0 6a Motor Leg - Left: 2 6b Motor Leg - Right: 0 7 Limb Ataxia: 0 8 Sensory: 1 9 Best Language: 0 10 Dysarthria: 1 11 Extinct. and Inatten.: 0 TOTAL: 9   Labs I have reviewed labs in epic and the results pertinent to this consultation are:  CBC    Component Value Date/Time   WBC 8.0 03/16/2021 0917   WBC 3.6 (L) 03/07/2021 0511   RBC 5.93 (H) 03/16/2021 0917   RBC 5.96 (H) 03/07/2021 0511   HGB 13.8 03/16/2021 0917   HCT 41.8 03/16/2021 0917   PLT 502 (H) 03/16/2021 0917   MCV 71 (L) 03/16/2021 0917   MCH 23.3 (L) 03/16/2021 0917   MCH 22.7 (L) 03/07/2021 0511   MCHC 33.0 03/16/2021 0917   MCHC 30.7 03/07/2021 0511   RDW 19.1 (H) 03/16/2021 0917   LYMPHSABS 0.8 03/07/2021 0511   LYMPHSABS 1.8 01/12/2019 1038   MONOABS 0.5 03/07/2021 0511   EOSABS 0.1 03/07/2021 0511   EOSABS 0.1 01/12/2019 1038   BASOSABS 0.0 03/07/2021 0511   BASOSABS 0.0 01/12/2019 1038    CMP     Component Value Date/Time   NA 144 10/17/2021 0000   K 3.6 10/17/2021 0000   CL 107 10/17/2021 0000   CO2 22 10/17/2021 0000   GLUCOSE 76 03/07/2021 0511   BUN 13 10/17/2021 0000   CREATININE 1.1 10/17/2021 0000   CREATININE 0.82 03/07/2021 0511   CALCIUM 9.8 10/17/2021 0000   PROT 5.2 (L) 03/05/2021 1948   PROT 6.4 10/20/2020 1609   ALBUMIN 4.4 10/17/2021 0000   ALBUMIN 4.3 10/20/2020 1609   AST 18 10/17/2021 0000   ALT 14 10/17/2021  0000   ALKPHOS 88 10/17/2021 0000   BILITOT  0.3 03/05/2021 1948   BILITOT <0.2 10/20/2020 1609   GFRNONAA >60 03/07/2021 0511   GFRAA 70 10/20/2020 1609    Lipid Panel     Component Value Date/Time   CHOL 236 (A) 10/17/2021 0000   CHOL 219 (H) 07/16/2018 1256   TRIG 289 (A) 10/17/2021 0000   HDL 68 10/17/2021 0000   HDL 69 07/16/2018 1256   CHOLHDL 3.2 07/16/2018 1256   CHOLHDL 4.9 09/22/2015 0344   VLDL 21 09/22/2015 0344   LDLCALC 124 10/17/2021 0000   LDLCALC 122 (H) 07/16/2018 1256     Imaging I have reviewed the images obtained:  CT-head There is no acute intracranial hemorrhage or evidence of acute infarction. ASPECT score is 10.  CTA H/N with Perf: 1. Luminal irregularity of the A2 segment of the left anterior cerebral artery with short segment of occlusion versus severe stenosis at the A3 segment with poststenotic ectasia. The distal left ACA branches are normally opacified. 2. No other vessel occlusion identified. 3. A 2 mm right ICA terminus aneurysm. 4. No significant stenosis of the major neck arteries. 5. A 2.1 cm incidental left thyroid nodule. Recommend nonemergent thyroid US.   MRI examination of the brain No acute intracranial process. No evidence of acute or subacute infarct.  Assessment:  Andrea Berry is a 56 y.o. female with past medical history of blindness, anxiety and depression, stroke with no residual deficits, Lupus antiphosholipid positive, vitamin D deficiency, hx of PE on Xarelto, GERD who presents to [facility] ED via EMS from work at [employer] for left arm pain and numbness. Code stroke was called in triage @ [phone]. NIHSS 9. LKW 0800. She denies slurred speech, weakness. There was a delay in getting patient to CT scan due to inability to obtain PIV or obtain labs. CBG was 58 and treated with 1 amp of D50.  Impression: TIA vs recrudescence of prior stroke sx  Recommendations:  - Admit for TIA workup - Goal normotension,  avoid hypotension - TTE w/ bubble. If bubble (+) check bilat [place] for DVT given hx antiphospholipid Ab syndrome - Check A1c and LDL + add statin per guidelines - No antiplatelets indicated in addition to therapeutic anticoagulation - q4 hr neuro checks - STAT head CT for any change in neuro exam - Tele - PT/OT/SLP - Stroke education - Amb referral to neurology upon discharge  - UA, UDS, other infectious/metabolic workup per primary team  [place], [place]  Neurology Attending Attestation   I examined the patient and discussed plan with Ms. Rogers Blocker NP. Above note has been edited by me to reflect my findings and recommendations. I was present throughout the stroke code and made all significant decisions and personally reviewed CNS imaging and also discussed CTA results with radiologist by phone.    Su Monks, MD [facility] [phone]   If 7pm- 7am, please page neurology on call as listed in [place].

## 2022-04-12 NOTE — Assessment & Plan Note (Signed)
Incidental findings of left thyroid nodule measuring 2.1 cm on CT angio. Thyroid exam unremarkable without tenderness or palpable abnormalities.  -pending TSH and T4 -recommended thyroid US outpatient

## 2022-04-12 NOTE — ED Provider Notes (Signed)
[facility] EMERGENCY DEPARTMENT Provider Note   CSN: [id number] Arrival date & time: 04/12/22  2637     History  Chief Complaint  Patient presents with   Code Stroke    Andrea Berry is a 56 y.o. female.  Patient with a history of PE on Xarelto, blindness, lupus anticoagulant disorder, neuromyelitis, lupus cerebritis, hyponatremia, hypertension presenting as code stroke.  States she was at work when she developed left-sided weakness to her arm and leg about 8 AM.  Code stroke was activated in triage.  Has had a previous stroke with left-sided deficits in the past but is now having slurred speech, facial droop and weakness to her arm and leg. She denies headache.  Denies chest pain or shortness of breath.  Denies abdominal pain.  The history is provided by the patient and the EMS personnel. The history is limited by the condition of the patient.       Home Medications Prior to Admission medications   Medication Sig Start Date End Date Taking? Authorizing Provider  alendronate (FOSAMAX) 70 MG tablet TAKE 1 TABLET BY MOUTH IN THE MORNING EVERY 7 DAYS 02/23/22   McDiarmid, Blane Ohara, MD  diltiazem (CARDIZEM CD) 180 MG 24 hr capsule TAKE 1 CAPSULE BY MOUTH EVERY DAY 03/20/22   McDiarmid, Blane Ohara, MD  DULoxetine (CYMBALTA) 60 MG capsule TAKE 1 CAPSULE BY MOUTH EVERY DAY 03/29/22   McDiarmid, Blane Ohara, MD  esomeprazole (NEXIUM) 20 MG capsule TAKE 1 CAPSULE (20 MG TOTAL) BY MOUTH DAILY AT 12 NOON. 12/25/21   McDiarmid, Blane Ohara, MD  gabapentin (NEURONTIN) 300 MG capsule Take 600 mg by mouth 3 (three) times daily. 09/21/20   [provider]  LASTACAFT 0.25 % SOLN Place 1 drop into both eyes 2 (two) times daily.  01/19/20   [provider]  LINZESS 145 MCG CAPS capsule TAKE ONE CAPSULE EVERY DAY ON EMPTY STOMACH AT LEAST 30MIN BEFORE 1ST MEAL OF THE DAY 12/25/21   McDiarmid, Blane Ohara, MD  LORazepam (ATIVAN) 0.5 MG tablet TAKE 1 TABLET BY MOUTH TWICE A DAY AS NEEDED  FOR ANXIETY 11/20/21   McDiarmid, Blane Ohara, MD  metroNIDAZOLE (METROGEL VAGINAL) 0.75 % vaginal gel Place 1 Applicatorful vaginally at bedtime. Insert one applicator, at bedtime, for 5 nights. 02/10/22   Johnston Ebbs, NP  OXcarbazepine (TRILEPTAL) 150 MG tablet Take 150 mg by mouth daily. 04/26/20   [provider]  Polyethyl Glycol-Propyl Glycol (SYSTANE) 0.4-0.3 % SOLN Place 1 drop into both eyes daily as needed (dry eyes).    [provider]  predniSONE (DELTASONE) 5 MG tablet TAKE 1 TABLET BY MOUTH EVERY DAY 07/26/21   McDiarmid, Blane Ohara, MD  promethazine (PHENERGAN) 25 MG tablet Take 1 tablet (25 mg total) by mouth every 6 (six) hours as needed for nausea or vomiting. 11/23/21   McDiarmid, Blane Ohara, MD  RiTUXimab (RITUXAN IV) Inject 1,000 mg into the vein See admin instructions. 1,000 mg into the vein every 6 months    [provider]  traMADol (ULTRAM) 50 MG tablet TAKE 1 TABLET BY MOUTH EVERY 6 HOURS AS NEEDED. 11/20/21   McDiarmid, Blane Ohara, MD  XARELTO 10 MG TABS tablet TAKE 1 TABLET BY MOUTH EVERY DAY WITH A MEAL 11/13/21   McDiarmid, Blane Ohara, MD      Allergies    Beta adrenergic blockers and Ceftriaxone    Review of Systems   Review of Systems  Unable to perform ROS: Acuity of condition  Physical Exam Updated Vital Signs BP (!) 143/73 (BP Location: Right Arm)   Pulse 80   Temp 98.5 F (36.9 C) (Oral)   Resp 16   SpO2 100%  Physical Exam Vitals and nursing note reviewed.  Constitutional:      General: She is not in acute distress.    Appearance: She is well-developed.  HENT:     Head: Normocephalic and atraumatic.     Mouth/Throat:     Pharynx: No oropharyngeal exudate.  Eyes:     Conjunctiva/sclera: Conjunctivae normal.     Pupils: Pupils are equal, round, and reactive to light.     Comments: Chronic blindness   Neck:     Comments: No meningismus. Cardiovascular:     Rate and Rhythm: Normal rate and regular rhythm.     Heart sounds: Normal heart  sounds. No murmur heard. Pulmonary:     Effort: Pulmonary effort is normal. No respiratory distress.     Breath sounds: Normal breath sounds.  Chest:     Chest wall: No tenderness.  Abdominal:     Palpations: Abdomen is soft.     Tenderness: There is no abdominal tenderness. There is no guarding or rebound.  Musculoskeletal:        General: No tenderness. Normal range of motion.     Cervical back: Normal range of motion and neck supple.  Skin:    General: Skin is warm.  Neurological:     Mental Status: She is alert and oriented to person, place, and time.     Cranial Nerves: Cranial nerve deficit present.     Sensory: Sensory deficit present.     Motor: Weakness present. No abnormal muscle tone.     Coordination: Coordination normal.     Comments: Mild dysarthria.  Left-sided facial droop.  Pronator drift of left arm with decreased strength lifting arm off the bed.  Decrease strength lifting leg off the bed on the left Decreased grip strength in left arm with weakness in forearm flexion and extension.  Psychiatric:        Behavior: Behavior normal.     ED Results / Procedures / Treatments   Labs (all labs ordered are listed, but only abnormal results are displayed) Labs Reviewed  CBC - Abnormal; Notable for the following components:      Result Value   RBC 5.26 (*)    Hemoglobin 11.8 (*)    MCV 74.0 (*)    MCH 22.4 (*)    All other components within normal limits  HEMOGLOBIN A1C - Abnormal; Notable for the following components:   Hgb A1c MFr Bld 6.3 (*)    All other components within normal limits  I-STAT CHEM 8, ED - Abnormal; Notable for the following components:   Potassium 5.5 (*)    All other components within normal limits  CBG MONITORING, ED - Abnormal; Notable for the following components:   Glucose-Capillary 58 (*)    All other components within normal limits  CBG MONITORING, ED - Abnormal; Notable for the following components:   Glucose-Capillary 272 (*)     All other components within normal limits  CBG MONITORING, ED - Abnormal; Notable for the following components:   Glucose-Capillary 68 (*)    All other components within normal limits  RESP PANEL BY RT-PCR (FLU A&B, COVID) ARPGX2  ETHANOL  DIFFERENTIAL  PROTIME-INR  APTT  LDL CHOLESTEROL, DIRECT  RAPID URINE DRUG SCREEN, HOSP PERFORMED  URINALYSIS, ROUTINE W REFLEX MICROSCOPIC  COMPREHENSIVE METABOLIC  PANEL  HIV ANTIBODY (ROUTINE TESTING W REFLEX)  BASIC METABOLIC PANEL  COMPREHENSIVE METABOLIC PANEL  CBC  TSH  T4, FREE  I-STAT BETA HCG BLOOD, ED ([facility], [facility], [facility] ONLY)  TROPONIN I (HIGH SENSITIVITY)  TROPONIN I (HIGH SENSITIVITY)    EKG EKG Interpretation  Date/Time:  Thursday April 12 2022 09:14:29 EDT Ventricular Rate:  79 PR Interval:  144 QRS Duration: 74 QT Interval:  386 QTC Calculation: 442 R Axis:   70 Text Interpretation: Normal sinus rhythm Left ventricular hypertrophy with repolarization abnormality ( Sokolow-Lyon ) Abnormal ECG When compared with ECG of 05-Mar-2021 17:55, PREVIOUS ECG IS PRESENT No significant change was found Confirmed by Ezequiel Essex [phone]) on 04/12/2022 10:04:35 AM  Radiology CT HEAD CODE STROKE WO CONTRAST  Result Date: 04/12/2022 CLINICAL DATA:  Code stroke. Neuro deficit, acute, stroke suspected; left-sided weakness EXAM: CT HEAD WITHOUT CONTRAST TECHNIQUE: Contiguous axial images were obtained from the base of the skull through the vertex without intravenous contrast. RADIATION DOSE REDUCTION: This exam was performed according to the departmental dose-optimization program which includes automated exposure control, adjustment of the mA and/or kV according to patient size and/or use of iterative reconstruction technique. COMPARISON:  03/05/2021 FINDINGS: Brain: No acute intracranial hemorrhage, mass effect, or edema. Gray-white differentiation is preserved. Ventricles and sulci are normal in size and configuration. No extra-axial collection.  Vascular: No hyperdense vessel. Skull: Unremarkable. Sinuses/Orbits: Paranasal sinus mucosal thickening. Unremarkable orbits. Other: Mastoid air cells are clear. ASPECTS ([place] Stroke Program Early CT Score) - Ganglionic level infarction (caudate, lentiform nuclei, internal capsule, insula, M1-M3 cortex): 7 - Supraganglionic infarction (M4-M6 cortex): 3 Total score (0-10 with 10 being normal): 10 IMPRESSION: There is no acute intracranial hemorrhage or evidence of acute infarction. ASPECT score is 10. These results were communicated to Dr. Quinn Axe at 9:45 am on 04/12/2022 by text page via the [facility] messaging system. Electronically Signed   By: Macy Mis M.D.   On: 04/12/2022 09:46    Procedures Ultrasound ED Peripheral IV (Provider)  Date/Time: 04/12/2022 10:08 AM  Performed by: Ezequiel Essex, MD Authorized by: Ezequiel Essex, MD   Procedure details:    Indications: poor IV access     Skin Prep: chlorhexidine gluconate     Location:  Right AC   Angiocath:  18 G   Bedside Ultrasound Guided: Yes     Images: not archived     Patient tolerated procedure without complications: Yes     Dressing applied: Yes   .Critical Care  Performed by: Ezequiel Essex, MD Authorized by: Ezequiel Essex, MD   Critical care provider statement:    Critical care time (minutes):  35   Critical care time was exclusive of:  Separately billable procedures and treating other patients   Critical care was necessary to treat or prevent imminent or life-threatening deterioration of the following conditions:  CNS failure or compromise   Critical care was time spent personally by me on the following activities:  Development of treatment plan with patient or surrogate, discussions with consultants, evaluation of patient's response to treatment, examination of patient, ordering and review of laboratory studies, ordering and review of radiographic studies, ordering and performing treatments and interventions, pulse  oximetry, re-evaluation of patient's condition and review of old charts     Medications Ordered in ED Medications  dextrose 50 % solution (has no administration in time range)  dextrose 50 % solution (has no administration in time range)    ED Course/ Medical Decision Making/ A&P  Medical Decision Making Amount and/or Complexity of Data Reviewed Labs: ordered. Decision-making details documented in ED Course. Radiology: ordered and independent interpretation performed. Decision-making details documented in ED Course. ECG/medicine tests: ordered and independent interpretation performed. Decision-making details documented in ED Course.  Risk Decision regarding hospitalization.  Left-sided weakness and numbness.  Code stroke activated in triage.  Seen at bedside with Dr. Quinn Axe of neurology.  CT head is negative for hemorrhage.  Results reviewed and interpreted by me.  Dr. Quinn Axe recommending CT angiogram as well as CT perfusion study.  She is not a candidate for TNK given her anticoagulation use  Difficult IV access.  Ultrasound-guided IV placed by myself to right AC.  Initial blood glucose was low at 58.  D50 was given. Potassium 5.5, likely hemolyzed.  No acute EKG changes.  Dr. Quinn Axe has interpreted patient's CT angiogram and CT perfusion study which is negative for acute finding.  Patient not a candidate for IR intervention or thrombolytics.  MRI and inpatient work-up recommended.  No Xarelto or antiplatelet medications until after MRI per Dr. Quinn Axe.  Blood sugar has normalized. Weakness on L persists. Denies CP Or SOB. No back pain. C/o pain to L forearm that started at same time as weakness and numbness. Intact radial pulse.   Admission d/w family practice residents.         Final Clinical Impression(s) / ED Diagnoses Final diagnoses:  Acute ischemic stroke [facility])    Rx / DC Orders ED Discharge Orders     None         Kyrin Gratz,  Annie Main, MD 04/12/22 1756

## 2022-04-12 NOTE — Hospital Course (Addendum)
Andrea Berry was admitted to the inpatient FM teaching service from 8/3 - 04/13/22 due to left upper extremity weakness and numbness. Her hospital course is outlined below.  Acute left hemiparesis on chronic left hemiparesis Initial workup for acute stroke was negative with unremarkable CT head and MRI brain. CTA h/n with short segment occlusion vs stenosis in A3 and no other occlusion. MRI cervical spine showing potential sequela of neuromyelitis vs SLE, MS also on the differential. Echo unremarkable. Neurology was consulted and felt pt was stable for discharge after negative stroke w/u.  Concern for lymphoma Spoke with patient's pulmonologist, Dr. Early Osmond at [facility]. He reports that [name] recently had a PET scan with findings concerning for lymphoma which may be contributing to her presentation. Possibly metastatic nodules in liver and peripancreatic/paraaortic lymph nodes. He suggested liver biopsy. Findings and impression from the scan (04/10/22) are in [place]. Dr. Burt Knack office will arrange for biopsy; pt should stop Xarelto 3 days prior.  Issues for PCP to Follow Up: Concern for lymphoma given recent PET scan with possible metastatic lesions, per pt's pulmonologist Dr. Early Osmond. Their office will schedule pt for liver biopsy. Please ensure that the biopsy gets scheduled. Patient should stop Xarelto 3 days prior to biopsy. Microcytic anemia. Consider outpatient iron studies. Due for repeat colonoscopy- last one in 2018 found two polyps and was recommended for repeat in 3-5 years.  CT angio: incidental 2.1cm left sided thyroid nodule, recommended thyroid US outpatient. TSH/T4 wnl. A1c 6.3, recommend repeating in 3 months, continue to follow Home OT and outpatient PT recommended

## 2022-04-12 NOTE — Evaluation (Signed)
Clinical/Bedside Swallow Evaluation Patient Details  Name: Merary Garguilo MRN: [id number] Date of Birth: 1966/04/17  Today's Date: 04/12/2022 Time: SLP Start Time (ACUTE ONLY): 1227 SLP Stop Time (ACUTE ONLY): 3149 SLP Time Calculation (min) (ACUTE ONLY): 14 min  Past Medical History:  Past Medical History:  Diagnosis Date   Acute respiratory distress syndrome (ARDS) due to COVID-19 virus ([place]) 03/27/2019   Acute respiratory failure with hypoxia ([place]) 02/16/2020   Anemia of chronic disease 10/28/2015   Anxiety 07/12/2018   Arthritis    Ataxia 01/03/2016   Blind    Chronic central neuropathic pain 10/02/2018   [facility] 09/2017   Chronic coughing 09/10/2016   Chronic pain syndrome 07/12/2018   Chronic respiratory failure with hypoxia ([facility])    Chronic rhinitis 01/07/2019   COVID-19 02/04/2020   Decreased strength 10/08/2018   Proximal leg muscles   Depression    Depression with anxiety 01/03/2016   Devic's disease ([place])    Dysesthesia 01/03/2016   Dyspnea on exertion 11/07/2018   Elevated BP without diagnosis of hypertension 08/28/2019   Report of [facility] nurse in December with normal SBPs but DBPs in low 90s    Eustachian tube dysfunction 11/18/2018   L>R. Followed by [facility] ENT.   Eustachian tube dysfunction, bilateral 09/18/2018   Eustachian tube dysfunction, left 11/13/2018   L>R. Followed by [facility].   GERD (gastroesophageal reflux disease)    Greater trochanteric bursitis of left hip 04/27/2020   Formatting of this note might be different from the original. Added automatically from request for surgery [id number]  Formatting of this note might be different from the original. Added automatically from request for surgery [id number]   Headache(784.0)    History of chicken pox 10/08/2018   History of COVID-19 02/16/2020   History of CVA (cerebrovascular accident)    History of kidney stones    History of pulmonary embolism 12/10/2018   Lupus Antiphosholipid (+). Patient on  secondary prophylaxis with rivaroxaban 20 mg daily long-term.     Hypertension    Hypertrophic cardiomyopathy ([place]) 03/02/2020   Hyponatremia 11/25/2015   Impairment of balance 10/08/2018   Legal blindness 01/30/2014   Long-term corticosteroid use 10/08/2018   Lupus ([place])    Lupus anticoagulant disorder ([place]) 10/02/2018   Pulmonary Embolism 2017   Lupus cerebritis ([place]) 11/25/2015   Possible explanation of 11/25/2015 acute hyperintensity findings on Brain MRI.    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   Orthostatic dizziness, Recurrent 10/08/2018   Associated with medications   Otalgia 07/12/2018   Personal history of immunosupression therapy 10/08/2018   Chronic Azathioprine and prednisone therapy for Devic's disease   Pigmented skin lesion of uncertain nature 7/41/2878   Pneumonia 02/21/2020   Postmenopause 10/21/2020   Shortness of breath    due to medication   Skin lesion of chest wall 11/07/2018   SLE (systemic lupus erythematosus related syndrome) ([place]) 11/07/2018   Smith antibody positive 04/07/2019   Stroke ([place]) 2008   visually impaired   Syncope    Transaminasemia 10/21/2020   Ureteral calculi 2018   Vitamin D deficiency 10/06/2018   Past Surgical History:  Past Surgical History:  Procedure Laterality Date   BREAST BIOPSY     BREAST CYST EXCISION Left pt unsure   COLONOSCOPY WITH PROPOFOL N/A 07/26/2017   Procedure: COLONOSCOPY WITH PROPOFOL;  Surgeon: Carol Ada, MD;  Location: [facility];  Service: Endoscopy;  Laterality: N/A;   ESOPHAGOGASTRODUODENOSCOPY (EGD) WITH PROPOFOL N/A 07/26/2017   Procedure:  ESOPHAGOGASTRODUODENOSCOPY (EGD) WITH PROPOFOL;  Surgeon: Carol Ada, MD;  Location: [facility];  Service: Endoscopy;  Laterality: N/A;   PORTA CATH INSERTION     TUBAL LIGATION     TUBAL LIGATION     HPI:  Pt is a 56 y.o. female who presented with left upper extremity weakness, numbness, tingling and pain. MRI brain negative for acute changes. [facility] not  completed at time of evaluation since pt made NPO by MD pending SLP's BSE. PMH: multiple prior strokes, SLE, history of PE on xarelto and legally blind.    Assessment / Plan / Recommendation  Clinical Impression  Pt was seen for bedside swallow evaluation and she denied a history of dysphagia. Oral mechanism exam was [facility]. Pt was edentulous; she reported owning dentures, but stated that she has not been wearing them recently since they need to re-aligned. She tolerated all solids and liquids without signs or symptoms of oropharyngeal dysphagia. A regular texture diet with thin liquids is recommended at this time and further skilled SLP services are not clinically indicated for swallowing. SLP Visit Diagnosis: Dysphagia, unspecified (R13.10)    Aspiration Risk  No limitations    Diet Recommendation Regular;Thin liquid   Liquid Administration via: Cup;Straw Medication Administration: Whole meds with liquid Supervision:  (pt will need set up due to visual impairment) Postural Changes: Seated upright at 90 degrees    Other  Recommendations Oral Care Recommendations: Oral care BID;Patient independent with oral care    Recommendations for follow up therapy are one component of a multi-disciplinary discharge planning process, led by the attending physician.  Recommendations may be updated based on patient status, additional functional criteria and insurance authorization.  Follow up Recommendations No SLP follow up      Assistance Recommended at Discharge    Functional Status Assessment    Frequency and Duration            Prognosis        Swallow Study   General Date of Onset: 04/12/22 HPI: Pt is a 56 y.o. female who presented with left upper extremity weakness, numbness, tingling and pain. MRI brain negative for acute changes. [facility] not completed at time of evaluation since pt made NPO by MD pending SLP's BSE. PMH: multiple prior strokes, SLE, history of PE on xarelto and legally  blind. Type of Study: Bedside Swallow Evaluation Previous Swallow Assessment: none Diet Prior to this Study: NPO Temperature Spikes Noted: No Respiratory Status: Room air History of Recent Intubation: No Behavior/Cognition: Alert;Cooperative;Pleasant mood Oral Cavity Assessment: Within Functional Limits Oral Care Completed by SLP: No Oral Cavity - Dentition: Edentulous;Dentures, not available Vision: Impaired for self-feeding Self-Feeding Abilities: Needs set up Patient Positioning: Upright in bed;Postural control adequate for testing Baseline Vocal Quality: Normal Volitional Cough: Strong Volitional Swallow: Able to elicit    Oral/Motor/Sensory Function Overall Oral Motor/Sensory Function: Within functional limits   Ice Chips Ice chips: Not tested   Thin Liquid Thin Liquid: Within functional limits Presentation: Straw    Nectar Thick Nectar Thick Liquid: Not tested   Honey Thick Honey Thick Liquid: Not tested   Puree Puree: Within functional limits Presentation: Spoon   Solid     Solid: Within functional limits Presentation: [place] [address] [place], [place], [place] Office number [phone]  Horton Marshall 04/12/2022,12:41 PM

## 2022-04-12 NOTE — ED Provider Triage Note (Signed)
Emergency Medicine Provider Triage Evaluation Note  Andrea Berry , a 56 y.o. female  was evaluated in triage.  Pt complains of left upper/lower extremity weakness/numbness since 8 or 830 this morning.  She has a history of CVA but states she did not have these residual symptoms since.  She has been on Xarelto since.  She notes some pain from her left elbow down to her hand as well.  Denies any known trauma to the head or fall.  Patient is baseline visually impaired but states there is been no acute change.   Review of Systems  Positive: See above Negative:   Physical Exam  BP (!) 143/73 (BP Location: Right Arm)   Pulse 80   Temp 98.5 F (36.9 C) (Oral)   Resp 16   SpO2 100%  Gen:   Awake, no distress   Resp:  Normal effort  MSK:   Muscle strength decreased on left compared to right for upper and lower extremity.  Patient complains of numbness from elbow down on left upper extremity. Other:    Medical Decision Making  Medically screening exam initiated at 9:18 AM.  Appropriate orders placed.  Andrea Berry was informed that the remainder of the evaluation will be completed by another provider, this initial triage assessment does not replace that evaluation, and the importance of remaining in the ED until their evaluation is complete.  Full neuro exam was not performed secondary to the acute nature of strokelike symptoms.  Code stroke was called while in triage.   Wilnette Kales, [place] 04/12/22 1927

## 2022-04-12 NOTE — Assessment & Plan Note (Addendum)
K 5.5 -> 4.2 s/p lokelma. - resolved

## 2022-04-12 NOTE — Progress Notes (Addendum)
FMTS Interim Progress Note  S: Seen with Dr. Nancy Fetter.  Patient reports no change in weakness or numbness since admission.  Left arm and hand feels numb and tingly but can still feel pressure. Denies any headache.  O: BP 126/65 (BP Location: Right Arm)   Pulse 80   Temp (!) 97.5 F (36.4 C) (Oral)   Resp 18   SpO2 100%    General: Lying comfortably in hospital bed, NAD Neuro: 3 out of 5 left grip strength, 5 out of 5 right grip strength, sensation intact in left distal extremity C6-C8  A/P: Acute left hemiparesis on chronic left hemiparesis Patient's symptomatic. no changes since admission.  MRI brain no acute abnormal process. Cervical spine MRI demonstrate sequela of previous neuromyelitis, but no active spinal cord inflammation. Neuromyelitis optica versus acute transverse myelitis. - Prednisone 40 mg daily for 3 days - Neurology following, recommendations appreciated   Salvadore Oxford, MD 04/12/2022, 11:05 PM PGY-1, [place] Medicine Service pager [phone]

## 2022-04-12 NOTE — ED Notes (Signed)
Patient transported to MRI 

## 2022-04-12 NOTE — Code Documentation (Signed)
Andrea Berry is a 56 yr old woman with past medical history of blindness. She reports a prior stroke with complete recovery. She reports taking Xarelto.     Pt was last known well today at 0800 while at work at the [employer]. She reports a sudden onset of left sided weakness and sensory disturbance. Pt arrived [facility] 0908. Code stroke alert activated in triage at [phone]. Stroke team met pt in triage at 0930. CBG, Labs and IV access being obtained. Airway clear by Dr. Wyvonnia Dusky. CBG 58, 1 amp Dextrose given by SRN at 0935.     Pt to CT at 0935. NCCT negative for acute abnormality per Dr. Quinn Axe. Some delay in obtaining 56 G for CTA, which was done by EDP with ultrasound.CBG recheck 272. Pt NIHSS 9 for blindness, left hemiparesis, left sensory loss and slight facial asymmetry and mild dysarthria. CTA begun at 1000.    Per Dr. Quinn Axe, CTA is negative for LVO. Pt taken to room 25 where her workup will continue. She will need q 2 hr VS and NIHSS. Bedside handoff with [facility] RN complete.

## 2022-04-12 NOTE — Assessment & Plan Note (Signed)
A1c 6.3 -diet and exercise counseling recommended  -follow up with PCP outpatient, consider repeat in 3 months for risk stratification

## 2022-04-13 ENCOUNTER — Encounter: Payer: Self-pay | Admitting: Family Medicine

## 2022-04-13 ENCOUNTER — Other Ambulatory Visit: Payer: Self-pay | Admitting: Family Medicine

## 2022-04-13 ENCOUNTER — Other Ambulatory Visit (HOSPITAL_COMMUNITY): Payer: Self-pay

## 2022-04-13 ENCOUNTER — Telehealth: Payer: Self-pay | Admitting: Family Medicine

## 2022-04-13 DIAGNOSIS — C8590 Non-Hodgkin lymphoma, unspecified, unspecified site: Secondary | ICD-10-CM

## 2022-04-13 DIAGNOSIS — G459 Transient cerebral ischemic attack, unspecified: Secondary | ICD-10-CM | POA: Diagnosis not present

## 2022-04-13 DIAGNOSIS — R160 Hepatomegaly, not elsewhere classified: Secondary | ICD-10-CM

## 2022-04-13 DIAGNOSIS — I639 Cerebral infarction, unspecified: Secondary | ICD-10-CM | POA: Diagnosis not present

## 2022-04-13 DIAGNOSIS — G8194 Hemiplegia, unspecified affecting left nondominant side: Secondary | ICD-10-CM | POA: Diagnosis not present

## 2022-04-13 DIAGNOSIS — K769 Liver disease, unspecified: Secondary | ICD-10-CM

## 2022-04-13 HISTORY — DX: Hepatomegaly, not elsewhere classified: R16.0

## 2022-04-13 LAB — CBC
HCT: 38.3 % (ref 36.0–46.0)
Hemoglobin: 11.7 g/dL — ABNORMAL LOW (ref 12.0–15.0)
MCH: 22.7 pg — ABNORMAL LOW (ref 26.0–34.0)
MCHC: 30.5 g/dL (ref 30.0–36.0)
MCV: 74.2 fL — ABNORMAL LOW (ref 80.0–100.0)
Platelets: 317 10*3/uL (ref 150–400)
RBC: 5.16 MIL/uL — ABNORMAL HIGH (ref 3.87–5.11)
RDW: 14.6 % (ref 11.5–15.5)
WBC: 5.1 10*3/uL (ref 4.0–10.5)
nRBC: 0 % (ref 0.0–0.2)

## 2022-04-13 LAB — COMPREHENSIVE METABOLIC PANEL
ALT: 13 U/L (ref 0–44)
AST: 16 U/L (ref 15–41)
Albumin: 3.5 g/dL (ref 3.5–5.0)
Alkaline Phosphatase: 81 U/L (ref 38–126)
Anion gap: 10 (ref 5–15)
BUN: 9 mg/dL (ref 6–20)
CO2: 21 mmol/L — ABNORMAL LOW (ref 22–32)
Calcium: 9.1 mg/dL (ref 8.9–10.3)
Chloride: 108 mmol/L (ref 98–111)
Creatinine, Ser: 0.87 mg/dL (ref 0.44–1.00)
GFR, Estimated: >60 mL/min (ref >60)
Glucose, Bld: 79 mg/dL (ref 70–99)
Potassium: 4.2 mmol/L (ref 3.5–5.1)
Sodium: 139 mmol/L (ref 135–145)
Total Bilirubin: 0.4 mg/dL (ref 0.3–1.2)
Total Protein: 6.5 g/dL (ref 6.5–8.1)

## 2022-04-13 LAB — T4, FREE: Free T4: 0.63 ng/dL (ref 0.61–1.12)

## 2022-04-13 LAB — TSH: TSH: 2.549 u[IU]/mL (ref 0.350–4.500)

## 2022-04-13 MED ORDER — RIVAROXABAN 10 MG PO TABS
10.0000 mg | ORAL_TABLET | Freq: Every day | ORAL | Status: DC
  Administered 2022-04-13: 10 mg via ORAL
  Filled 2022-04-13: qty 1

## 2022-04-13 MED ORDER — RIVAROXABAN 10 MG PO TABS: 10.0000 mg | ORAL_TABLET | Freq: Two times a day (BID) | ORAL | Status: DC

## 2022-04-13 MED ORDER — PREDNISONE 20 MG PO TABS
40.0000 mg | ORAL_TABLET | Freq: Every day | ORAL | 0 refills | Status: AC
  Filled 2022-04-13: qty 4, 2d supply, fill #0

## 2022-04-13 MED ORDER — AMOXICILLIN-POT CLAVULANATE 875-125 MG PO TABS
1.0000 | ORAL_TABLET | Freq: Two times a day (BID) | ORAL | 0 refills | Status: DC
  Filled 2022-04-13: qty 14, 7d supply, fill #0

## 2022-04-13 MED ORDER — PREDNISONE 5 MG PO TABS: 5.0000 mg | ORAL_TABLET | Freq: Every day | ORAL | 1 refills | Status: DC

## 2022-04-13 NOTE — Progress Notes (Addendum)
STROKE TEAM PROGRESS NOTE   INTERVAL HISTORY Patient is seen in her room with no family at the bedside.  She presented yesterday with acute onset left arm pain and numbness which occurred while she was at work.  She reports that she has had several strokes in the past.  She states her pain is gone numbness is improving but not completely gone.  She denies significant neck pain or radicular pain recent trauma or injury. MRI scan is negative for acute stroke and CT angiogram shows severe left A3 stenosis with poststenotic dilatation which is likely chronic and not symptomatic  Vitals:   04/13/22 0003 04/13/22 0443 04/13/22 0753 04/13/22 1100  BP: 113/60 123/65 122/66 130/78  Pulse: 70 72 79 78  Resp: '18 17 16 16  '$ Temp: 97.6 F (36.4 C) 98.9 F (37.2 C) 98.9 F (37.2 C) 98.3 F (36.8 C)  TempSrc: Oral Oral Oral Oral  SpO2: 96% 97% 100% 100%   CBC:  Recent Labs  Lab 04/12/22 0935 04/12/22 0940 04/13/22 0639  WBC 6.1  --  5.1  NEUTROABS 4.5  --   --   HGB 11.8* 13.3 11.7*  HCT 38.9 39.0 38.3  MCV 74.0*  --  74.2*  PLT 270  --  161   Basic Metabolic Panel:  Recent Labs  Lab 04/12/22 1855 04/13/22 0639  NA 139 139  K 4.4 4.2  CL 110 108  CO2 22 21*  GLUCOSE 104* 79  BUN 11 9  CREATININE 0.90 0.87  CALCIUM 9.3 9.1   Lipid Panel: No results for input(s): "CHOL", "TRIG", "HDL", "CHOLHDL", "VLDL", "LDLCALC" in the last 168 hours. HgbA1c:  Recent Labs  Lab 04/12/22 1014  HGBA1C 6.3*   Urine Drug Screen: No results for input(s): "LABOPIA", "COCAINSCRNUR", "LABBENZ", "AMPHETMU", "THCU", "LABBARB" in the last 168 hours.  Alcohol Level  Recent Labs  Lab 04/12/22 1011  ETH <10    IMAGING past 24 hours ECHOCARDIOGRAM COMPLETE BUBBLE STUDY  Result Date: 04/12/2022    ECHOCARDIOGRAM REPORT   Patient Name:   Andrea Berry Frazer Date of Exam: 04/12/2022 Medical Rec #:  [id number]          Height:       61.0 in Accession #:    [id number]         Weight:       183.4 lb Date of  Birth:  13-Apr-1966          BSA:          1.820 m Patient Age:    56 years           BP:           130/74 mmHg Patient Gender: F                  HR:           73 bpm. Exam Location:  Inpatient Procedure: 2D Echo, Cardiac Doppler and Color Doppler Indications:    Stroke 434.91/I63.9  History:        Patient has prior history of Echocardiogram examinations, most                 recent 03/06/2021. Cardiomyopathy, Stroke; Risk                 Factors:Hypertension, Dyslipidemia and Diabetes.  Sonographer:    Ronny Flurry Referring Phys: [place]  1. Left ventricular ejection fraction, by estimation, is 60 to 65%. The left ventricle  has normal function. The left ventricle has no regional wall motion abnormalities. There is moderate asymmetric left ventricular hypertrophy of the septal segment. Left ventricular diastolic parameters are indeterminate.  2. Right ventricular systolic function is normal. The right ventricular size is normal. Tricuspid regurgitation signal is inadequate for assessing PA pressure.  3. The mitral valve is normal in structure. No evidence of mitral valve regurgitation. No evidence of mitral stenosis.  4. The aortic valve is normal in structure. Aortic valve regurgitation is moderate. No aortic stenosis is present. Aortic regurgitation PHT measures 449 msec.  5. The inferior vena cava is normal in size with greater than 50% respiratory variability, suggesting right atrial pressure of 3 mmHg. FINDINGS  Left Ventricle: Left ventricular ejection fraction, by estimation, is 60 to 65%. The left ventricle has normal function. The left ventricle has no regional wall motion abnormalities. The left ventricular internal cavity size was normal in size. There is  moderate asymmetric left ventricular hypertrophy of the septal segment. Left ventricular diastolic parameters are indeterminate. Right Ventricle: The right ventricular size is normal. No increase in right ventricular wall  thickness. Right ventricular systolic function is normal. Tricuspid regurgitation signal is inadequate for assessing PA pressure. Left Atrium: Left atrial size was normal in size. Right Atrium: Right atrial size was normal in size. Pericardium: There is no evidence of pericardial effusion. Mitral Valve: The mitral valve is normal in structure. No evidence of mitral valve regurgitation. No evidence of mitral valve stenosis. Tricuspid Valve: The tricuspid valve is normal in structure. Tricuspid valve regurgitation is not demonstrated. No evidence of tricuspid stenosis. Aortic Valve: The aortic valve is normal in structure. Aortic valve regurgitation is moderate. Aortic regurgitation PHT measures 449 msec. No aortic stenosis is present. Pulmonic Valve: The pulmonic valve was normal in structure. Pulmonic valve regurgitation is not visualized. No evidence of pulmonic stenosis. Aorta: The aortic root is normal in size and structure. Venous: The inferior vena cava is normal in size with greater than 50% respiratory variability, suggesting right atrial pressure of 3 mmHg. IAS/Shunts: No atrial level shunt detected by color flow Doppler.  LEFT VENTRICLE PLAX 2D LVIDd:         4.20 cm   Diastology LVIDs:         2.90 cm   LV e' medial:    7.29 cm/s LV PW:         1.00 cm   LV E/e' medial:  9.2 LV IVS:        1.30 cm   LV e' lateral:   5.33 cm/s LVOT diam:     1.60 cm   LV E/e' lateral: 12.6 LV SV:         59 LV SV Index:   33 LVOT Area:     2.01 cm  RIGHT VENTRICLE RV S prime:     16.40 cm/s TAPSE (M-mode): 1.7 cm LEFT ATRIUM             Index        RIGHT ATRIUM           Index LA diam:        3.80 cm 2.09 cm/m   RA Area:     10.00 cm LA Vol (A2C):   33.6 ml 18.46 ml/m  RA Volume:   21.30 ml  11.70 ml/m LA Vol (A4C):   26.2 ml 14.39 ml/m LA Biplane Vol: 29.8 ml 16.37 ml/m  AORTIC VALVE LVOT Vmax:   160.00 cm/s LVOT Vmean:  119.000 cm/s LVOT VTI:    0.295 m AI PHT:      449 msec  AORTA Ao Root diam: 2.60 cm Ao Asc  diam:  3.00 cm MITRAL VALVE MV Area (PHT): 4.33 cm    SHUNTS MV Decel Time: 175 msec    Systemic VTI:  0.30 m MV E velocity: 66.90 cm/s  Systemic Diam: 1.60 cm MV A velocity: 78.50 cm/s MV E/A ratio:  0.85 Kardie Tobb DO Electronically signed by Berniece Salines DO Signature Date/Time: 04/12/2022/7:20:53 PM    Final    MR CERVICAL SPINE W WO CONTRAST  Result Date: 04/12/2022 CLINICAL DATA:  Provided history: Acute left arm weakness with history of multiple sclerosis. EXAM: MRI CERVICAL SPINE WITHOUT AND WITH CONTRAST TECHNIQUE: Multiplanar and multiecho pulse sequences of the cervical spine, to include the craniocervical junction and cervicothoracic junction, were obtained without and with intravenous contrast. CONTRAST:  51m GADAVIST GADOBUTROL 1 MMOL/ML IV SOLN COMPARISON:  Same-day brain MRI 04/12/2022. Prior cervical spine MRI examinations 09/21/2015 and 09/22/2015. FINDINGS: Alignment: Slight C2-C3 grade 1 anterolisthesis. Vertebrae: Vertebral body height is maintained. Marrow edema and enhancement within the posterior elements on the right at C2-C3, likely degenerative and related to facet arthrosis. Cord: Atrophic appearance of the spinal cord at the cervical and visualized upper thoracic levels. Patchy and ill-defined T2 hyperintense signal abnormality throughout the majority of the spinal cord at these levels. No pathologic spinal cord enhancement. Posterior Fossa, vertebral arteries, paraspinal tissues: Posterior fossa better assessed on same-day brain MRI. Flow voids preserved within the imaged cervical vertebral arteries. No paraspinal mass or collection. Disc levels: Unless otherwise stated, the level by level findings below have not significantly changed from the prior MRI of 09/21/2015. No more than mild disc degeneration within the cervical spine. C2-C3: Slight grade 1 anterolisthesis. No significant disc herniation or spinal canal stenosis. Progressive moderate facet arthrosis on the right, now  resulting in mild right neural foraminal narrowing. C3-C4: No significant disc herniation or spinal canal stenosis. Uncovertebral hypertrophy and facet arthrosis on the left. Resultant moderate left neural foraminal narrowing. C4-C5: Facet arthrosis. No significant disc herniation or stenosis. C5-C6: Facet arthrosis. No significant disc herniation or stenosis. C6-C7: Facet arthrosis (predominantly on the left). No significant disc herniation or stenosis. C7-T1: No significant disc herniation or stenosis. IMPRESSION: Atrophic appearance of the spinal cord at the cervical and visualized upper thoracic levels. There is associated extensive T2 hyperintense signal abnormality throughout the majority of the spinal cord at these levels, and these findings are consistent with sequela of prior myelitis. A history of multiple sclerosis is provided. Per the electronic medical record, the patient also has a history of SLE and neuromyelitis optica spectrum disorder. Given the appearance of the spinal cord on the prior cervical spine MRI exams in January 2017, sequela of neuromyelitis optica spectrum disorder or sequela of SLE are favored. No pathologic spinal cord enhancement to suggest active spinal cord inflammation at this time. Cervical spondylosis, as outlined. At C2-C3, there is progressive moderate facet arthrosis on the right resulting in mild relative right neural foraminal narrowing. Associated degenerative edema and enhancement within the posterior elements on the right. At C3-C4, uncovertebral hypertrophy and facet arthrosis contribute to moderate left neural foraminal narrowing. Findings at this level are unchanged. No significant spinal canal or foraminal stenosis at the remaining levels. Electronically Signed   By: KKellie SimmeringD.O.   On: 04/12/2022 15:47    PHYSICAL EXAM General:  Alert, well-developed, well-nourished patient in no acute  distress Respiratory:  Regular, unlabored respirations on room  air  NEURO:  Mental Status: AA&Ox3  Speech/Language: speech is without dysarthria or aphasia.  Fluency, and comprehension intact.  Cranial Nerves:  II: Blind at baseline III, IV, VI: EOMI. Eyelids elevate symmetrically.  V: Sensation is intact to light touch and symmetrical to face.  VII: Smile is symmetrical.   VIII: hearing intact to voice. IX, X: Phonation is normal.  WF:UXNATFTD shrug 5/5. XII: tongue is midline without fasciculations. Motor: 5/5 strength to all muscle groups tested.  Sensation- Intact to light touch bilaterally but diminished on the left Coordination: No drift, fine finger movements intact Gait- deferred   ASSESSMENT/PLAN Ms. Andrea Berry is a 56 y.o. female with history of blindness, anxiety, depression, stroke, antiphospholipid antibodies positive, vitamin D deficiency, GERD and PE on Xarelto presenting with acute onset left arm pain and numbness which occurred while she was at work.    Right hemispheric subcortical TIA from small vessel disease vs. Recrudescence of deficits from old stroke Code Stroke CT head No acute abnormality.  CTA head & neck luminal irregularity of A2 segment of left ACA with short segment of occlusion versus severe stenosis at A3 segment, 79m right ICA terminus aneurysm, left thyroid nodule MRI  No acute abnormality 2D Echo EF 60-65%, LVH of septal segment, no atrial level shunt LDL direct 67 HgbA1c 6.3 VTE prophylaxis - fully anticoagulated with Xarelto    Diet   Diet regular Room service appropriate? Yes with Assist; Fluid consistency: Thin   Xarelto (rivaroxaban) daily prior to admission, now on Xarelto (rivaroxaban) daily.  [place]rapy recommendations:  [place], [place] PT Disposition:  pending  Hypertension Home meds:  none Stable Keep SBP <180 Long-term BP goal normotensive  Hyperlipidemia Home meds:  atorvastatin 40 mg daily, resumed in hospital LDL direct 67, goal < 70 Continue statin at discharge  Diabetes  type II Controlled Home meds:  none HgbA1c 6.3, goal < 7.0 CBGs Recent Labs    04/12/22 0946 04/12/22 1306 04/12/22 1834  GLUCAP 272* 68* 100*    SSI  Other Stroke Risk Factors Obesity, There is no height or weight on file to calculate BMI., BMI >/= 30 associated with increased stroke risk, recommend weight loss, diet and exercise as appropriate  Hx stroke  Other Active Problems Left thyroid nodule Recommend outpatient thyroid ultrasound  Hospital day # 1[place], MSN, AGACNP-BC [facility] See Amion for schedule and pager information 04/13/2022 2:32 PM  I have personally obtained history,examined this patient, reviewed notes, independently viewed imaging studies, participated in medical decision making and plan of care.ROS completed by me personally and pertinent positives fully documented  I have made any additions or clarifications directly to the above note. Agree with note above.  She presented with left upper extremity numbness and pain which appears to be improving unclear if this represents a new right hemispheric subcortical TIA versus worsening of old deficits.  Recommend she continue Xarelto for stroke prevention and maintain aggressive risk factor modification.  Long discussion with patient and answered questions.  Greater than 50% time during this 50-minute visit was spent in counseling and coordination of care about TIA and stroke prevention and answering questions and discussion with careteam.  Stroke team will sign off.  Can call for questions.  PAntony Contras MD Medical Director [facility] Pager: [phone]/12/2021 4:45 PM   To contact Stroke Continuity provider, please refer to [email or web address] After hours, contact  General Neurology

## 2022-04-13 NOTE — Progress Notes (Signed)
Nurse received call from patient's pulmonologist's requesting to speak to MD. Joeseph Amor, MD notified.  Gwendolyn Grant, RN

## 2022-04-13 NOTE — TOC Initial Note (Signed)
Transition of Care [facility]) - Initial/Assessment Note    Patient Details  Name: Andrea Berry MRN: [id number] Date of Birth: 1966/08/13  [facility] CM/SW Contact:    Pollie Friar, RN Phone Number: 04/13/2022, 12:37 PM  Clinical Narrative:                 Pt is from home with her son that works during the daytime. She has all needed DME. Her son oversees her medications and provides some transportation but she also uses [facility].  Recommendations are for outpatient therapy but she prefers home health. Pt has no preference on the agency. CM has arranged [place] with [facility]. Information on the AVS. TOC following.  Expected Discharge Plan: [place] Barriers to Discharge: Continued Medical Work up   Patient Goals and CMS Choice   CMS Medicare.gov Compare Post Acute Care list provided to:: Patient Choice offered to / list presented to : Patient  Expected Discharge Plan and Services Expected Discharge Plan: [name]   Discharge Planning Services: CM Consult Post Acute Care Choice: [place] arrangements for the past 2 months: Single Family Home                           HH Arranged: PT, OT HH Agency: [place] Date [facility] Agency Contacted: 04/13/22   Representative spoke with at [place]: Tommi Rumps  Prior Living Arrangements/Services Living arrangements for the past 2 months: [place] Lives with:: Adult Children Patient language and need for interpreter reviewed:: Yes Do you feel safe going back to the place where you live?: Yes        Care giver support system in place?: Yes (comment) (son works in daytime) Current home services: [employer] copy cane/ walker/ shower seat) Criminal Activity/Legal Involvement Pertinent to Current Situation/Hospitalization: No - Comment as needed  Activities of Daily Living      Permission Sought/Granted                  Emotional Assessment Appearance::  Appears stated age Attitude/Demeanor/Rapport: Engaged Affect (typically observed): Accepting Orientation: : Oriented to Self, Oriented to Place, Oriented to  Time, Oriented to Situation   Psych Involvement: No (comment)  Admission diagnosis:  Stroke [facility]) [I63.9] Acute ischemic stroke [facility]) [I63.9] Patient Active Problem List   Diagnosis Date Noted   Stroke ([place]) 04/13/2022   Acute left hemiparesis on chronic left hemiparesis 04/12/2022   Hyperkalemia 04/12/2022   Prediabetes 04/12/2022   Thyroid nodule 04/12/2022   Blindness of both eyes 04/12/2022   Current chronic use of systemic steroids 01/16/2022   Secondary adrenal insufficiency ([place]) 01/16/2022   DJD (degenerative joint disease) of knee 11/27/2021   Hyperlipidemia 11/23/2021   Beta thalassemia minor 03/07/2021   Postmenopause 10/21/2020   Lumbar nerve root impingement 10/04/2020   OSA (obstructive sleep apnea), Severe 10/04/2020   Left hemiparesis ([place]) 05/26/2020   Cardiomyopathy, hypertrophic obstructive, mid cavitary ([place]) 03/02/2020   Microcytic anemia 02/26/2020   Physical deconditioning 02/16/2020   Specific antibody deficiency response to vaccination 01/18/2020   ILD (interstitial lung disease) ([place]) 08/31/2019   Constipation 08/04/2019   Steroid-induced osteopenia 03/23/2019   Healthcare maintenance 01/07/2019   SLE (systemic lupus erythematosus related syndrome) ([place]) 11/07/2018   Mild cognitive impairment 10/08/2018   Personal history of immunosupression therapy 10/08/2018   Long-term corticosteroid use 10/08/2018   Chronic central neuropathic pain 10/02/2018  Lupus anticoagulant disorder ([place]) 10/02/2018   Chronic pain syndrome 07/12/2018   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[place]) 01/03/2016   Essential hypertension 09/21/2015   Mood disorder ([place]) 09/21/2015   Legal blindness 01/30/2014   Chronic GERD 03/20/2013   PCP:  McDiarmid, Blane Ohara, MD Pharmacy:   [id number]: [phone]: [phone]    Social Determinants of Health (SDOH) Interventions    Readmission Risk Interventions    03/02/2020    4:11 PM  Readmission Risk Prevention Plan  Transportation Screening Complete  PCP or Specialist Appt within 5-7 Days Complete  Home Care Screening Complete  Medication Review (RN CM) Complete

## 2022-04-13 NOTE — Discharge Instructions (Addendum)
Dear Andrea Berry,  Thank you for letting us participate in your care. You were hospitalized for left arm weakness. Your workup for stroke was negative. Your symptoms were thought to be related to your neuromyelitis and/or lupus. You received a 3-day course of steroids.  During your stay, we spoke with your pulmonologist Dr. Early Osmond. He recommended that you follow up with his office for a liver biopsy given the results of your recent PET scan.  POST-HOSPITAL & CARE INSTRUCTIONS Please follow up with your pulmonologist for a liver biopsy Please take the '40mg'$  prednisone (2 tablets of '20mg'$ ) for 2 more days. After completing this course, please continue taking your '5mg'$  prednisone. Please take the Augmentin antibiotic for 7 days as prescribed below Please stop taking your Xarelto (rivaroxaban) 3 days prior to your liver biopsy appointment. Continue to follow with home occupational therapy and outpatient physical therapy Go to your follow up appointments (listed below)   DOCTOR'S APPOINTMENT   Future Appointments  Date Time Provider [place]  04/19/2022  9:30 AM McDiarmid, Blane Ohara, MD [facility]    Follow-up Information     McDiarmid, Blane Ohara, MD Follow up on 04/19/2022.   Specialty: Family Medicine Why: You have an appointment with Dr. McDiarmid at 9:30am on Thursday, August 10th Contact information: [place] [address] [phone]                 Take care and be well!  [facility]  [place], [place] [address] [phone]

## 2022-04-13 NOTE — Progress Notes (Addendum)
Placing ambulatory referral to IR for ultrasound guided liver biopsy. Per patients pulmonologist, there is concern for possible lymphoma with liver metastasis on recent PET scan.   Addendum: Cancelling order as Pulmonologist's office will be arranging this for patient.

## 2022-04-13 NOTE — Evaluation (Signed)
Occupational Therapy Evaluation Patient Details Name: Andrea Berry MRN: [id number] DOB: Aug 19, 1966 Today's Date: 04/13/2022   History of Present Illness Pt is a 56 y.o. female with past medical history of blindness, anxiety and depression, stroke, Lupus antiphosholipid positive, hx of PE on Xarelto, GERD. Admitted 8/3 with LUE pain and weakness; Acute left hemiparesis on chronic left hemiparesis.   Clinical Impression   PTA patient independent with ADLs, mobility, working at the [employer].  Pt currently admitted for above and presents with problem list below, including impaired L hand/UE strength, sensation and coordination, decreased activity tolerance.  She demonstrates ability to complete ADLs with supervision, ambulation with supervision due to unfamiliar environment. Believe she will best benefit from outpatient OT, but reports difficult to get a ride and would prefer home health services.  Will follow acutely to optimize independence, safety and return to PLOF.       Recommendations for follow up therapy are one component of a multi-disciplinary discharge planning process, led by the attending physician.  Recommendations may be updated based on patient status, additional functional criteria and insurance authorization.   Follow Up Recommendations  Home health OT    Assistance Recommended at Discharge Set up Supervision/Assistance  Patient can return home with the following A little help with bathing/dressing/bathroom;Assistance with cooking/housework;Assist for transportation;Help with stairs or ramp for entrance    Functional Status Assessment  Patient has had a recent decline in their functional status and demonstrates the ability to make significant improvements in function in a reasonable and predictable amount of time.  Equipment Recommendations  None recommended by OT    Recommendations for Other Services       Precautions / Restrictions  Precautions Precautions: Other (comment) (Blindness) Precaution Comments: Blindness Restrictions Weight Bearing Restrictions: No      Mobility Bed Mobility               General bed mobility comments: OOB upon entry in recliner    Transfers Overall transfer level: Needs assistance Equipment used: None Transfers: Sit to/from Stand Sit to Stand: Supervision           General transfer comment: Supervision for safety, due to unfamiliar environment and blindness. Good stability upon standing.      Balance Overall balance assessment: Mild deficits observed, not formally tested                                         ADL either performed or assessed with clinical judgement   ADL Overall ADL's : Needs assistance/impaired     Grooming: Standing;Set up;Supervision/safety           Upper Body Dressing : Set up;Sitting   Lower Body Dressing: Supervision/safety;Sit to/from stand   Toilet Transfer: Supervision/safety;Ambulation           Functional mobility during ADLs: Supervision/safety General ADL Comments: increased time due to visual deficits at baseline, limited by L hand coordination and sensation, strength     Vision Baseline Vision/History: 2 Legally blind Ability to See in Adequate Light: 4 Severely impaired Additional Comments: baseline deficits     Perception     Praxis      Pertinent Vitals/Pain Pain Assessment Pain Assessment: 0-10 Pain Score: 6  Pain Location: left hand Pain Descriptors / Indicators: Dull Pain Intervention(s): Limited activity within patient's tolerance, Monitored during session, Repositioned, Other (comment) (asking nurse to remove  1 IV)     Hand Dominance Right   Extremity/Trunk Assessment Upper Extremity Assessment Upper Extremity Assessment: LUE deficits/detail;Generalized weakness LUE Deficits / Details: hx of weakness from prior CVA, but pt reports increased weakness. grossly 3+/5 MMT,  decreased [place] and impaired sensation (reports numbness in hand, pain in forearm to hand) LUE Sensation: decreased light touch LUE Coordination: decreased fine motor   Lower Extremity Assessment Lower Extremity Assessment: Defer to PT evaluation   Cervical / Trunk Assessment Cervical / Trunk Assessment: Normal   Communication Communication Communication: No difficulties   Cognition Arousal/Alertness: Awake/alert Behavior During Therapy: WFL for tasks assessed/performed Overall Cognitive Status: Within Functional Limits for tasks assessed                                       General Comments  pt reports preference to [facility]    Exercises     Shoulder Instructions      Home Living Family/patient expects to be discharged to:: Private residence Living Arrangements: Children (Son) Available Help at Discharge: Family;Available PRN/intermittently Type of Home: House Home Access: Stairs to enter [employer] of Steps: 2 Entrance Stairs-Rails: Right;Left;Can reach both Home Layout: One level     Bathroom Shower/Tub: Teacher, early years/pre: Standard Bathroom Accessibility: No   Home Equipment: Conservation officer, nature (2 wheels);Cane - single point;BSC/3in1;Shower seat;Toilet riser;Grab bars - toilet;Wheelchair - manual   Additional Comments: Patient works for [employer]      Prior Functioning/Environment Prior Level of Function : Working/employed;Independent/Modified Independent             Mobility Comments: Uses white cane (2/2 blindness) to ambulate in community and work ADLs Comments: ind        OT Problem List: Decreased strength;Decreased activity tolerance;Decreased coordination;Pain;Impaired UE functional use;Impaired sensation      OT Treatment/Interventions: Self-care/ADL training;Patient/family education;Therapeutic activities;DME and/or AE instruction;Neuromuscular education    OT Goals(Current goals  can be found in the care plan section) Acute Rehab OT Goals Patient Stated Goal: less pain in my L hand OT Goal Formulation: With patient Time For Goal Achievement: 04/27/22 Potential to Achieve Goals: Good  OT Frequency: Min 2X/week    Co-evaluation              AM-PAC OT "6 Clicks" Daily Activity     Outcome Measure Help from another person eating meals?: A Little Help from another person taking care of personal grooming?: A Little Help from another person toileting, which includes using toliet, bedpan, or urinal?: A Little Help from another person bathing (including washing, rinsing, drying)?: A Little Help from another person to put on and taking off regular upper body clothing?: A Little Help from another person to put on and taking off regular lower body clothing?: A Little 6 Click Score: 18   End of Session Nurse Communication: Mobility status;Other (comment) (pain in L hand, possible removing 1/2 IVs?)  Activity Tolerance: Patient tolerated treatment well Patient left: in chair;with call bell/phone within reach;with chair alarm set  OT Visit Diagnosis: Other abnormalities of gait and mobility (R26.89);Muscle weakness (generalized) (M62.81);Other symptoms and signs involving the nervous system (P82.423)                Time: 5361-4431 OT Time Calculation (min): 15 min Charges:  OT General Charges $OT Visit: 1 Visit OT Evaluation $OT Eval Moderate Complexity: 1 Mod  Andrea Berry B,  OT Acute Rehabilitation Services Office [phone]   Delight Stare 04/13/2022, 1:30 PM

## 2022-04-13 NOTE — Evaluation (Signed)
Physical Therapy Evaluation and Discharge Patient Details Name: Andrea Berry MRN: [id number] DOB: November 10, 1965 Today's Date: 04/13/2022  History of Present Illness  56 y.o. female 57 y.o. female with past medical history of blindness, anxiety and depression, stroke, Lupus antiphosholipid positive, hx of PE on Xarelto, GERD. Admitted 8/3 with LUE pain and weakness; Acute left hemiparesis on chronic left hemiparesis.  Clinical Impression  Patient evaluated by Physical Therapy with no further acute PT needs identified. All education has been completed and the patient has no further questions. Feels she is ambulating at baseline level (slowly due to unfamiliar environment with VC only for direction and guidance.) Navigates steps safely. Lives with son who works, she also works on [employer] at [employer], and will need to be able to use her LUE. Recommend OP neuro PT follow-up to progress functional UE use to allow return to work. See below for any follow-up Physical Therapy or equipment needs. PT is signing off. Thank you for this referral.        Recommendations for follow up therapy are one component of a multi-disciplinary discharge planning process, led by the attending physician.  Recommendations may be updated based on patient status, additional functional criteria and insurance authorization.  Follow Up Recommendations Outpatient PT (Neuro PT for LUE)      Assistance Recommended at Discharge None  Patient can return home with the following  Assist for transportation    Equipment Recommendations None recommended by PT  Recommendations for Other Services       Functional Status Assessment Patient has had a recent decline in their functional status and demonstrates the ability to make significant improvements in function in a reasonable and predictable amount of time.     Precautions / Restrictions Precautions Precautions: Other (comment) (Blindness) Precaution  Comments: Blindness Restrictions Weight Bearing Restrictions: No      Mobility  Bed Mobility Overal bed mobility: Modified Independent             General bed mobility comments: extra time    Transfers Overall transfer level: Needs assistance Equipment used: None Transfers: Sit to/from Stand Sit to Stand: Supervision           General transfer comment: Supervision for safety, due to unfamiliar environment and blindness. Good stability upon standing.    Ambulation/Gait Ambulation/Gait assistance: Supervision Gait Distance (Feet): 195 Feet Assistive device: None Gait Pattern/deviations: Step-through pattern, Decreased stride length Gait velocity: slow     General Gait Details: Slow and cautious. No AD required, prefers verbal cues for direction due to blindness. no LOB.  Stairs Stairs: Yes Stairs assistance: Supervision Stair Management: One rail Right, Forwards Number of Stairs: 2 General stair comments: Safely navigates steps alternating step pattern with single rail, supervision for safety due to blindness in unfamiliar enviornment. Pt reports feeling confident with task.  Wheelchair Mobility    Modified Rankin (Stroke Patients Only) Modified Rankin (Stroke Patients Only) Pre-Morbid Rankin Score: No significant disability Modified Rankin: Moderate disability     Balance Overall balance assessment: Modified Independent                                           Pertinent Vitals/Pain Pain Assessment Pain Assessment: 0-10 Pain Score: 6  Pain Location: left hand Pain Descriptors / Indicators: Dull Pain Intervention(s): Limited activity within patient's tolerance, Repositioned, Monitored during session  Home Living Family/patient expects to be discharged to:: Private residence Living Arrangements: Children (Son) Available Help at Discharge: Family;Available PRN/intermittently Type of Home: House Home Access: Stairs to  enter Entrance Stairs-Rails: Right;Left;Can reach both Entrance Stairs-Number of Steps: 2   Home Layout: One level Home Equipment: Conservation officer, nature (2 wheels);Cane - single point;BSC/3in1;Shower seat;Toilet riser;Grab bars - toilet;Wheelchair - manual Additional Comments: Patient works for [employer]    Prior Function Prior Level of Function : Working/employed;Independent/Modified Independent             Mobility Comments: Uses white cane (2/2 blindness) to ambulate in community and work ADLs Comments: ind     Hand Dominance   Dominant Hand: Right    Extremity/Trunk Assessment   Upper Extremity Assessment Upper Extremity Assessment: Defer to OT evaluation    Lower Extremity Assessment Lower Extremity Assessment: Generalized weakness;RLE deficits/detail;LLE deficits/detail RLE Deficits / Details: sensation intact RLE:  (Low effort with MMT, grossly 4/5) RLE Sensation: WNL LLE Deficits / Details: Grossly 3-/5 with MMT throughout. Not consistent with functional strength LLE Sensation: WNL    Cervical / Trunk Assessment Cervical / Trunk Assessment: Normal  Communication   Communication: No difficulties  Cognition Arousal/Alertness: Awake/alert Behavior During Therapy: WFL for tasks assessed/performed Overall Cognitive Status: Within Functional Limits for tasks assessed                                          General Comments      Exercises     Assessment/Plan    PT Assessment Patient does not need any further PT services  PT Problem List         PT Treatment Interventions      PT Goals (Current goals can be found in the Care Plan section)  Acute Rehab PT Goals Patient Stated Goal: Feel better PT Goal Formulation: All assessment and education complete, DC therapy    Frequency       Co-evaluation               AM-PAC PT "6 Clicks" Mobility  Outcome Measure Help needed turning from your back to your side while in  a flat bed without using bedrails?: None Help needed moving from lying on your back to sitting on the side of a flat bed without using bedrails?: None Help needed moving to and from a bed to a chair (including a wheelchair)?: A Little Help needed standing up from a chair using your arms (e.g., wheelchair or bedside chair)?: A Little Help needed to walk in hospital room?: A Little Help needed climbing 3-5 steps with a railing? : A Little 6 Click Score: 20    End of Session Equipment Utilized During Treatment: Gait belt Activity Tolerance: Patient tolerated treatment well;No increased pain Patient left: in chair;with call bell/phone within reach;with chair alarm set   PT Visit Diagnosis: Hemiplegia and hemiparesis;Other symptoms and signs involving the nervous system (R29.898) (acute on chronic hemiparesis) Hemiplegia - Right/Left: Left Hemiplegia - dominant/non-dominant: Non-dominant Hemiplegia - caused by:  (Hx of CVA)    Time: 1025-8527 PT Time Calculation (min) (ACUTE ONLY): 31 min   Charges:   PT Evaluation $PT Eval Low Complexity: 1 Low PT Treatments $Therapeutic Activity: 8-22 mins        Candie Mile, PT   Ellouise Newer 04/13/2022, 10:16 AM

## 2022-04-13 NOTE — Discharge Summary (Addendum)
[facility] Discharge Summary  Patient name: Andrea Berry Medical record number: [id number] Date of birth: [birth year] Age: 56 y.o. Gender: female Date of Admission: 04/12/2022  Date of Discharge: 04/13/22 Admitting Physician: Blane Ohara McDiarmid, MD  Primary Care Provider: McDiarmid, Blane Ohara, MD Consultants: Neurology  Indication for Hospitalization: Left arm hemiparesis likely 2/2 neuromyelitis  Brief Hospital Course:  Roneisha Stern was admitted to the inpatient FM teaching service from 8/3 - 04/13/22 due to left upper extremity weakness and numbness. Her hospital course is outlined below.  Acute left hemiparesis on chronic left hemiparesis Initial workup for acute stroke was negative with unremarkable CT head and MRI brain. CTA h/n with short segment occlusion vs stenosis in A3 and no other occlusion. MRI cervical spine showing potential sequela of neuromyelitis vs SLE, MS also on the differential. Unclear if symptoms represented TIA vs progression of her underlying neurological illnesses. Echo unremarkable. Neurology was consulted and felt pt was stable for discharge after negative stroke w/u.  Concern for lymphoma Spoke with patient's pulmonologist, Dr. Early Osmond at [facility]. He reports that Ms. Labate recently had a PET scan with findings concerning for lymphoma which may be contributing to her presentation. Possibly metastatic nodules in liver and peripancreatic/paraaortic lymph nodes. He suggested liver biopsy. Findings and impression from the scan (04/10/22) are in [place]. Dr. Burt Knack office will arrange for biopsy; pt should stop Xarelto 3 days prior.  Issues for PCP to Follow Up: Concern for lymphoma given recent PET scan with possible metastatic lesions, per pt's pulmonologist Dr. Early Osmond. Their office will schedule pt for liver biopsy. Please ensure that the biopsy gets scheduled. Patient should stop Xarelto 3 days prior to  biopsy. Microcytic anemia. Consider outpatient iron studies. Due for repeat colonoscopy- last one in 2018 found two polyps and was recommended for repeat in 3-5 years.  CT angio: incidental 2.1cm left sided thyroid nodule, recommended thyroid US outpatient. TSH/T4 wnl. A1c 6.3, recommend repeating in 3 months, continue to follow Home OT and outpatient PT recommended   Discharge Diagnoses/Problem List:  Principal Problem for Admission: Acute on chronic left hemiparesis likely 2/2 neuromyelitis Other Problems addressed during stay:  Principal Problem:   Acute left hemiparesis on chronic left hemiparesis Active Problems:   Lupus anticoagulant disorder (HCC)   Mild cognitive impairment   Microcytic anemia   Hyperkalemia   Prediabetes   Thyroid nodule   Blindness of both eyes   Stroke [facility])     Disposition: home  Discharge Condition: stable  Discharge Exam:  Vitals:   04/13/22 0753 04/13/22 1100  BP: 122/66 130/78  Pulse: 79 78  Resp: 16 16  Temp: 98.9 F (37.2 C) 98.3 F (36.8 C)  SpO2: 100% 100%   General: Sitting comfortably in bed, NAD Cardiovascular: RRR, no m/r/g Respiratory: CTAB, normal WOB Abdomen: Soft, NT/ND Neuro: 5/5 RLE/RUE strength, 3/5 LUE strength, 4/5 LLE strength. No sensory deficits. No facial droop noted today  Issues for Follow Up:  Concern for lymphoma given recent PET scan with possible metastatic lesions, per pt's pulmonologist Dr. Early Osmond. Their office will schedule pt for liver biopsy. Please ensure that the biopsy gets scheduled. Patient should stop Xarelto 3 days prior to biopsy. Microcytic anemia. Consider outpatient iron studies. Due for repeat colonoscopy- last one in 2018 found two polyps and was recommended for repeat in 3-5 years.  CT angio: incidental 2.1cm left sided thyroid nodule, recommended thyroid US outpatient. TSH/T4 wnl. A1c 6.3, recommend repeating in  3 months, continue to follow  Significant Procedures: n/a  Significant  Labs and Imaging:  Recent Labs  Lab 04/12/22 0935 04/12/22 0940 04/13/22 0639  WBC 6.1  --  5.1  HGB 11.8* 13.3 11.7*  HCT 38.9 39.0 38.3  PLT 270  --  317   Recent Labs  Lab 04/12/22 0940 04/12/22 1855 04/13/22 0639  NA 140 139 139  K 5.5* 4.4 4.2  CL 109 110 108  CO2  --  22 21*  GLUCOSE 73 104* 79  BUN '10 11 9  '$ CREATININE 0.90 0.90 0.87  CALCIUM  --  9.3 9.1  ALKPHOS  --  81 81  AST  --  16 16  ALT  --  12 13  ALBUMIN  --  3.4* 3.5    MR CERVICAL SPINE Radiologist impression: Atrophic appearance of the spinal cord at the cervical and visualized upper thoracic levels. There is associated extensive T2 hyperintense signal abnormality throughout the majority of the spinal cord at these levels, and these findings are consistent with sequela of prior myelitis. A history of multiple sclerosis is provided. Per the electronic medical record, the patient also has a history of SLE and neuromyelitis optica spectrum disorder. Given the appearance of the spinal cord on the prior cervical spine MRI exams in January 2017, sequela of neuromyelitis optica spectrum disorder or sequela of SLE are favored. No pathologic spinal cord enhancement to suggest active spinal cord inflammation at this time.   Cervical spondylosis, as outlined.   At C2-C3, there is progressive moderate facet arthrosis on the right resulting in mild relative right neural foraminal narrowing. Associated degenerative edema and enhancement within the posterior elements on the right.   At C3-C4, uncovertebral hypertrophy and facet arthrosis contribute to moderate left neural foraminal narrowing. Findings at this level are unchanged.   No significant spinal canal or foraminal stenosis at the remaining levels.   MR BRAIN Radiologist impression: No acute intracranial process. No evidence of acute or subacute infarct.   CTA H/N Radiologist impression: 1. Luminal irregularity of the A2 segment of the left  anterior cerebral artery with short segment of occlusion versus severe stenosis at the A3 segment with poststenotic ectasia. The distal left ACA branches are normally opacified. 2. No other vessel occlusion identified. 3. A 2 mm right ICA terminus aneurysm. 4. No significant stenosis of the major neck arteries. 5. A 2.1 cm incidental left thyroid nodule. Recommend nonemergent thyroid US. Reference: J Am Coll Radiol. 2015 Feb;12(2): 143-50   CT HEAD Radiologist impression: There is no acute intracranial hemorrhage or evidence of acute infarction. ASPECT score is 10.  Results/Tests Pending at Time of Discharge: none  Discharge Medications:  Allergies as of 04/13/2022       Reactions   Beta Adrenergic Blockers Other (See Comments)   Diagnosis of hypertrophic cardiomyopathy   Rocephin [ceftriaxone] Other (See Comments)   Multiorgan inflammation with drug fever and multiple elevated inflammatory markers.  Resolved upon cessation of abx.        Medication List     TAKE these medications    alendronate 70 MG tablet Commonly known as: FOSAMAX TAKE 1 TABLET BY MOUTH IN THE MORNING EVERY 7 DAYS What changed: See the new instructions.   amoxicillin-clavulanate 875-125 MG tablet Commonly known as: AUGMENTIN Take 1 tablet by mouth 2 (two) times daily for 7 days.   atorvastatin 40 MG tablet Commonly known as: LIPITOR Take 40 mg by mouth daily.   diclofenac Sodium 1 %  Gel Commonly known as: VOLTAREN Apply 1 Application topically daily as needed for pain.   diltiazem 180 MG 24 hr capsule Commonly known as: CARDIZEM CD TAKE 1 CAPSULE BY MOUTH EVERY DAY What changed: how much to take   DULoxetine 60 MG capsule Commonly known as: CYMBALTA TAKE 1 CAPSULE BY MOUTH EVERY DAY What changed: how much to take   esomeprazole 20 MG capsule Commonly known as: NEXIUM TAKE 1 CAPSULE (20 MG TOTAL) BY MOUTH DAILY AT 12 NOON.   gabapentin 300 MG capsule Commonly known as:  NEURONTIN Take 900 mg by mouth 3 (three) times daily.   Linzess 145 MCG Caps capsule Generic drug: linaclotide TAKE ONE CAPSULE EVERY DAY ON EMPTY STOMACH AT LEAST 30MIN BEFORE 1ST MEAL OF THE DAY What changed: See the new instructions.   LORazepam 0.5 MG tablet Commonly known as: ATIVAN TAKE 1 TABLET BY MOUTH TWICE A DAY AS NEEDED FOR ANXIETY What changed: See the new instructions.   metroNIDAZOLE 0.75 % vaginal gel Commonly known as: METROGEL VAGINAL Place 1 Applicatorful vaginally at bedtime. Insert one applicator, at bedtime, for 5 nights.   Oxcarbazepine 300 MG tablet Commonly known as: TRILEPTAL Take 300 mg by mouth daily.   predniSONE 20 MG tablet Commonly known as: DELTASONE Take 2 tablets (40 mg total) by mouth daily before breakfast for 2 days. Start taking on: April 14, 2022   predniSONE 5 MG tablet Commonly known as: DELTASONE Take 1 tablet (5 mg total) by mouth daily. Start taking on: April 16, 2022   promethazine 25 MG tablet Commonly known as: PHENERGAN Take 1 tablet (25 mg total) by mouth every 6 (six) hours as needed for nausea or vomiting.   RITUXAN IV Inject 1,000 mg into the vein See admin instructions. 1,000 mg into the vein every 6 months, then two weeks later another 1,000 mg injection   Systane 0.4-0.3 % Soln Generic drug: Polyethyl Glycol-Propyl Glycol Place 1 drop into both eyes daily as needed (dry eyes).   tiZANidine 4 MG tablet Commonly known as: ZANAFLEX Take 8 mg by mouth at bedtime.   traMADol 50 MG tablet Commonly known as: ULTRAM TAKE 1 TABLET BY MOUTH EVERY 6 HOURS AS NEEDED. What changed: reasons to take this   Xarelto 10 MG Tabs tablet Generic drug: rivaroxaban TAKE 1 TABLET BY MOUTH EVERY DAY WITH A MEAL What changed: See the new instructions.        Discharge Instructions: Please refer to Patient Instructions section of EMR for full details.  Patient was counseled important signs and symptoms that should prompt return  to medical care, changes in medications, dietary instructions, activity restrictions, and follow up appointments.   Follow-Up Appointments:  Follow-up Information     McDiarmid, Blane Ohara, MD Follow up on 04/19/2022.   Specialty: Family Medicine Why: You have an appointment with Dr. McDiarmid at 9:30am on Thursday, August 10th Contact information: [place] [address] [phone]                 August Albino, MD 04/13/2022, 3:03 PM PGY-1, [place]   I have seen and evaluated this patient along with Dr. Joeseph Amor and reviewed the above note, making necessary revisions.  Pearla Dubonnet, MD 04/13/2022, 3:43 PM PGY-2, [place]

## 2022-04-13 NOTE — Care Management Obs Status (Signed)
[facility] NOTIFICATION   Patient Details  Name: Andrea Berry MRN: [id number] Date of Birth: 09/28/1965   Medicare Observation Status Notification Given:  Yes    Pollie Friar, RN 04/13/2022, 10:28 AM

## 2022-04-13 NOTE — Progress Notes (Signed)
Daily Progress Note Intern Pager: [phone]  Patient name: Andrea Berry Medical record number: [id number] Date of birth: 01/21/1966 Age: 56 y.o. Gender: female  Primary Care Provider: McDiarmid, Blane Ohara, MD Consultants: Neuro Code Status: Full  Pt Overview and Major Events to Date:  8/3 - admitted  Assessment and Plan:  Andrea Berry is a 56yo F p/w acute on chronic LUE hemiparesis with paraesthesias. Initial workup for stroke has been negative, now thought to be sequela of myelitis vs SLE vs MS. Pertinent PMH/PSH includes SLE.   * Acute left hemiparesis on chronic left hemiparesis Imaging including CT head, MRI brain negative for acute stroke. Echo unremarkable. CTA h/n with short segment occlusion vs stenosis of A3 and no other occlusion. Symptoms may be 2/2 sequela of neuromyelitis vs SLE vs MS -Neurology consulted, appreciate recommendations -cardiac telemetry -neuro checks -vitals per routine -PT/OT/SLP evaluation   Thyroid nodule Incidental findings of left thyroid nodule measuring 2.1 cm on CT angio. Thyroid exam unremarkable without tenderness or palpable abnormalities.  -pending TSH and T4 -recommended thyroid US outpatient  Prediabetes A1c 6.3 -diet and exercise counseling recommended  -follow up with PCP outpatient, consider repeat in 3 months for risk stratification   Hyperkalemia K 5.5 -> 4.2 s/p lokelma. - resolved  Microcytic anemia Hgb 11.7, mch 74.2. Stable from past. Hx beta thalassemia minor -Due for colonoscopy  -follow outpatient       FEN/GI: protonix '40mg'$  qd PPx: SCDs, on xarelto Dispo:Home pending clinical improvement . Barriers include neuro following.   Subjective:  Pt feeling well this morning, about the same as yesterday. Still having L arm and leg weakness with some tingling and numbness. Otherwise no concerns.  Objective: Temp:  [97.5 F (36.4 C)-98.9 F (37.2 C)] 98.9 F (37.2 C) (08/04 0753) Pulse Rate:  [66-80] 79  (08/04 0753) Resp:  [16-18] 16 (08/04 0753) BP: (113-130)/(60-74) 122/66 (08/04 0753) SpO2:  [96 %-100 %] 100 % (08/04 0753) Physical Exam: General: Sitting comfortably in bed, NAD Cardiovascular: RRR, no m/r/g Respiratory: CTAB, normal WOB Abdomen: Soft, NT/ND Neuro: 5/5 RLE/RUE strength, 3/5 LUE strength, 4/5 LLE strength. No sensory deficits. No facial droop noted today  Laboratory: Most recent CBC Lab Results  Component Value Date   WBC 5.1 04/13/2022   HGB 11.7 (L) 04/13/2022   HCT 38.3 04/13/2022   MCV 74.2 (L) 04/13/2022   PLT 317 04/13/2022   Most recent BMP    Latest Ref Rng & Units 04/13/2022    6:39 AM  BMP  Glucose 70 - 99 mg/dL 79   BUN 6 - 20 mg/dL 9   Creatinine 0.44 - 1.00 mg/dL 0.87   Sodium 135 - 145 mmol/L 139   Potassium 3.5 - 5.1 mmol/L 4.2   Chloride 98 - 111 mmol/L 108   CO2 22 - 32 mmol/L 21   Calcium 8.9 - 10.3 mg/dL 9.1     Other pertinent labs   TSH, T4 wnl   Imaging/Diagnostic Tests:  MR CERVICAL SPINE Radiologist impression: Atrophic appearance of the spinal cord at the cervical and visualized upper thoracic levels. There is associated extensive T2 hyperintense signal abnormality throughout the majority of the spinal cord at these levels, and these findings are consistent with sequela of prior myelitis. A history of multiple sclerosis is provided. Per the electronic medical record, the patient also has a history of SLE and neuromyelitis optica spectrum disorder. Given the appearance of the spinal cord on the prior cervical spine MRI exams  in January 2017, sequela of neuromyelitis optica spectrum disorder or sequela of SLE are favored. No pathologic spinal cord enhancement to suggest active spinal cord inflammation at this time.   Cervical spondylosis, as outlined.   At C2-C3, there is progressive moderate facet arthrosis on the right resulting in mild relative right neural foraminal narrowing. Associated degenerative edema and  enhancement within the posterior elements on the right.   At C3-C4, uncovertebral hypertrophy and facet arthrosis contribute to moderate left neural foraminal narrowing. Findings at this level are unchanged.   No significant spinal canal or foraminal stenosis at the remaining levels.   MR BRAIN Radiologist impression: No acute intracranial process. No evidence of acute or subacute infarct.   CTA H/N Radiologist impression: 1. Luminal irregularity of the A2 segment of the left anterior cerebral artery with short segment of occlusion versus severe stenosis at the A3 segment with poststenotic ectasia. The distal left ACA branches are normally opacified. 2. No other vessel occlusion identified. 3. A 2 mm right ICA terminus aneurysm. 4. No significant stenosis of the major neck arteries. 5. A 2.1 cm incidental left thyroid nodule. Recommend nonemergent thyroid US. Reference: J Am Coll Radiol. 2015 Feb;12(2): 143-50  CT HEAD Radiologist impression: There is no acute intracranial hemorrhage or evidence of acute infarction. ASPECT score is 10.  Andrea Albino, MD 04/13/2022, 10:33 AM  PGY-1, [place] Intern pager: [phone], text pages welcome Secure chat group [place]

## 2022-04-13 NOTE — Progress Notes (Addendum)
FMTS Interim Progress Note  Spoke with patient's pulmonologist, Dr. Early Osmond at [facility]. He reports that Andrea Berry recently had a PET scan with findings concerning for lymphoma which may be contributing to her presentation. Possibly metastatic nodules in liver and peripancreatic/paraaortic lymph nodes. He suggested speaking with IR about doing a biopsy of liver nodules. Findings and impression from the scan (04/10/22) are included below and are in [place]. Will place IR consult.   Update: Spoke with IR who recommended outpatient biopsy. Touched base with Dr. Early Osmond who states that his office will arrange for the biopsy. Once they have a date, Ms. Marmolejos should stop her Xarelto 3 days prior.   FINDINGS:   Low-grade metabolic activity is seen associated with the 11 mm left lower lobe pleural-based pulmonary nodule. Maximum SUV 1.81.   New somewhat patchy areas of parenchymal disease throughout the bilateral lungs with associated elevated metabolic activity. There is a somewhat confluent area within the anterolateral right upper lobe image #77 with a maximum SUV 9.1.   Multiple hypermetabolic mediastinal and bilateral hilar lymph nodes. 1.3 cm right paratracheal lymph node with maximum SUV 4.0. Hypermetabolic left hilar adenopathy with a maximum SUV 4.4.   Right hilar adenopathy with a maximum SUV of 3.25.   Multiple small hypermetabolic hepatic mass lesions. The lack of contrast enhancement makes evaluation for underlying lesions difficult. However, there does appear to be a lesion measuring 1.6 cm inferiorly within the right lobe with maximum SUV 4.34. Additional smaller lesions are scattered throughout the liver.   9 mm hypermetabolic lymph node just anterior to the tail the pancreas with a maximum SUV of 3.39.    1 cm left periaortic lymph node image #145 maximum SUV 2.7.   1 cm right paratracheal lymph node image #171 with maximum SUV 7.   No hypermetabolic osseous  lesions.   Bilateral nephrolithiasis. No ureteral stones. No hydronephrosis.   CONCLUSION:   New scattered patchy areas of parenchymal disease within the lungs with elevated metabolic activity. Given the rapid onset these are likely related to infection. Viral pneumonia is a consideration.   Hypermetabolic mediastinal and bilateral hilar adenopathy likely reactive.   Low-grade metabolic activity associated with the 11 mm left lower lobe pulmonary nodule. The level of activity is not typical for malignancy.   Multiple hypermetabolic hepatic mass lesions. Hepatic metastasis are consideration. Consider contrast-enhanced CT or MRI of the liver in further evaluation.   Hypermetabolic peripancreatic and periaortic lymph nodes suspicious for metastatic lymph nodes.    August Albino, MD 04/13/2022, 1:44 PM PGY-1, [place] Service pager [phone]

## 2022-04-13 NOTE — Care Management CC44 (Signed)
Condition Code 44 Documentation Completed  Patient Details  Name: Andrea Berry MRN: [id number] Date of Birth: 1966-03-04   Condition Code 44 given:  Yes Patient signature on Condition Code 44 notice:  Yes Documentation of 2 MD's agreement:  Yes Code 44 added to claim:  Yes    Pollie Friar, RN 04/13/2022, 10:29 AM

## 2022-04-13 NOTE — Progress Notes (Signed)
Received consult request for liver biopsy. She had a recent PET scan with findings concerning for lymphoma, with nodules in liver and peripancreatic/paraaortic lymph nodes.    Liver nodule biopsies are generally performed in an outpatient setting and given her expected short-term hospitalization, anticoagulation status, and IR room availability constraints, recommendation is to proceed with biopsy on an outpatient basis.  This referral can be placed at discharge or by her outpatient provider.  Should circumstances change, procedure can be reconsidered next week.  IR remains available for  re-consult.   Electronically Signed: Pasty Spillers, PA-C 04/13/2022, 2:13 PM

## 2022-04-13 NOTE — Assessment & Plan Note (Signed)
Hgb 11.7, mch 74.2. Stable from past. Hx beta thalassemia minor -Due for colonoscopy  -follow outpatient

## 2022-04-13 NOTE — Progress Notes (Signed)
Patient discharge instructions discussed with patient and son at bedside. All questions answered. Iv removed and telemetry discontinued. Patient belongings returned and toc meds given; patient wheeled off unit for transport home by son.  Gwendolyn Grant, RN

## 2022-04-13 NOTE — Telephone Encounter (Signed)
Sent message to our referral coordinator to disregard IR referral. Our inpatient team discussed with the patients pulmonologist who states they will arrange this on their end.

## 2022-04-14 ENCOUNTER — Other Ambulatory Visit: Payer: Self-pay | Admitting: Family Medicine

## 2022-04-14 DIAGNOSIS — G894 Chronic pain syndrome: Secondary | ICD-10-CM

## 2022-04-16 NOTE — Addendum Note (Signed)
Addended by: Sharion Settler on: 04/16/2022 08:32 AM   Modules accepted: Orders

## 2022-04-19 ENCOUNTER — Ambulatory Visit (INDEPENDENT_AMBULATORY_CARE_PROVIDER_SITE_OTHER): Payer: Medicare Other | Admitting: Family Medicine

## 2022-04-19 ENCOUNTER — Encounter: Payer: Self-pay | Admitting: Family Medicine

## 2022-04-19 VITALS — BP 139/72 | HR 105 | Ht 61.0 in | Wt 176.6 lb

## 2022-04-19 DIAGNOSIS — N760 Acute vaginitis: Secondary | ICD-10-CM

## 2022-04-19 DIAGNOSIS — E041 Nontoxic single thyroid nodule: Secondary | ICD-10-CM

## 2022-04-19 DIAGNOSIS — G459 Transient cerebral ischemic attack, unspecified: Secondary | ICD-10-CM | POA: Diagnosis not present

## 2022-04-19 DIAGNOSIS — B9689 Other specified bacterial agents as the cause of diseases classified elsewhere: Secondary | ICD-10-CM

## 2022-04-19 DIAGNOSIS — I10 Essential (primary) hypertension: Secondary | ICD-10-CM

## 2022-04-19 DIAGNOSIS — F411 Generalized anxiety disorder: Secondary | ICD-10-CM

## 2022-04-19 DIAGNOSIS — F419 Anxiety disorder, unspecified: Secondary | ICD-10-CM

## 2022-04-19 MED ORDER — METRONIDAZOLE 500 MG PO TABS: 500.0000 mg | ORAL_TABLET | Freq: Two times a day (BID) | ORAL | 0 refills | Status: AC

## 2022-04-19 MED ORDER — LORAZEPAM 0.5 MG PO TABS: 0.5000 mg | ORAL_TABLET | Freq: Two times a day (BID) | ORAL | 0 refills | Status: DC

## 2022-04-19 NOTE — Progress Notes (Signed)
Follow up outpatient visit after Hospitalization  Andrea Berry is alone Sources of clinical information for visit is/are patient and DC Summary 04/13/22. The Discharge Summary for the hospitalization from 04/12/22 to 04/13/22 was reviewed.  Nursing assessment for this office visit was reviewed with the patient for accuracy and revision.   HPI Principle Diagnosis requiring hospitalization: Left arm hemiparesis   Brief Hospital course summary: Brief Hospital Course:  Andrea Berry was admitted to the inpatient FM teaching service from 8/3 - 04/13/22 due to left upper extremity weakness and numbness. Her hospital course is outlined below.   Acute left hemiparesis on chronic left hemiparesis Initial workup for acute stroke was negative with unremarkable CT head and MRI brain. CTA h/n with short segment occlusion vs stenosis in A3 and no other occlusion. MRI cervical spine showing potential sequela of neuromyelitis vs SLE, MS also on the differential. Unclear if symptoms represented TIA vs progression of her underlying neurological illnesses. Echo unremarkable. Neurology was consulted and felt pt was stable for discharge after negative stroke w/u.   Concern for lymphoma Spoke with patient's pulmonologist, Dr. Early Osmond at [facility] [place]. He reports that Andrea Berry recently had a PET scan with findings concerning for lymphoma which may be contributing to her presentation. Possibly metastatic nodules in liver and peripancreatic/paraaortic lymph nodes. He suggested liver biopsy. Findings and impression from the scan (04/10/22) are in [place]. Dr. Burt Knack office will arrange for biopsy; pt should stop Xarelto 3 days prior.  Discharge Diagnoses/Problem List:  Principal Problem for Admission: Acute on chronic left hemiparesis likely 2/2 Right hemispheric subcortical TIA from small vessel disease vs. Recrudescence of deficits from old stroke Other Problems addressed during stay:  Principal  Problem:   Acute left hemiparesis on chronic left hemiparesis Active Problems:   Lupus anticoagulant disorder (HCC)   Mild cognitive impairment   Microcytic anemia   Hyperkalemia   Prediabetes   Thyroid nodule   Blindness of both eyes   Stroke [facility])    Follow up instructions from patient's hospital healthcare providers:  Concern for lymphoma given recent PET scan with possible metastatic lesions, per pt's pulmonologist Dr. Early Osmond. Their office will schedule pt for liver biopsy. Please ensure that the biopsy gets scheduled. Patient should stop Xarelto 3 days prior to biopsy. CT angio: incidental 2.1cm left sided thyroid nodule, recommended thyroid US outpatient. TSH/T4 wnl. A1c 6.3, recommend repeating in 3 months, continue to follow Home OT and outpatient PT recommended ---------------------------------------------------------------------------------------------------------------------- Problems since hospital discharge:  None  ---------------------------------------------------------------------------------------------------------------------- Follow up appointments with specialists:  Pulmonology - pending Neurology - pending Liver lesion biopsy - pending Home Sleep Study per pulmonology- pending  ---------------------------------------------------------------------------------------------------------------------- New medications started during hospitalization: Prednisone 40 mg daily for three days then stop for stress dosing Chronic medications stopped during hospitalization: none Patient's Medication List was updated in the EMR: yes --------------------------------------------------------------------------------------------------------------------- Home Health Services: Physical Therapy and Occupational Therapy have not contacted patient.  Patient does not feel she still needs Physical Therapy/Occupational Therapy as she feels back to her baseline function.  Durable Medical  Equipment: white cane --------------------------------------------------------------------------------------------------------------------- ADLs Independent Needs Assistance Dependent  Bathing x    Dressing x    Ambulation x    Toileting x    Eating x     IADL Independent Needs Assistance Dependent  Cooking      Son because of blindness  Housework      Son because of blindness  Manage Medications  Son because of blindness      Manage the telephone x    Shopping for food, clothes, Meds, etc x    Use transportation x    Manage Finances x

## 2022-04-19 NOTE — Patient Instructions (Signed)
We will put off doing an ultrasound of your thyroid after you have had the workup by Dr Early Osmond for the abnromal findings on your PET scan.  STOP alendronate.  We will recheck your bone density in 2 years to see if you need to restart it.   A prescription for Flagyl (metronidazole) was sent to treat possible bacterial vaginosis that can cause body odors.   Take on tablet twice a day for 7 days.   Refill of your Lorazepam was sent in with an additional 10 tablets for the month.

## 2022-04-20 ENCOUNTER — Encounter: Payer: Self-pay | Admitting: Family Medicine

## 2022-04-20 DIAGNOSIS — F411 Generalized anxiety disorder: Secondary | ICD-10-CM

## 2022-04-20 HISTORY — DX: Generalized anxiety disorder: F41.1

## 2022-04-20 NOTE — Assessment & Plan Note (Addendum)
Incidental findings of left thyroid nodule measuring 2.1 cm on CT angio.  Thyroid exam unremarkable without tenderness or palpable abnormalities.  Unremarkable TSH and T4  PET scan did not mention thyroid lesion  Will check renal US for risk stratification once patient has completed workup of multiple hepatic lesions.

## 2022-04-20 NOTE — Assessment & Plan Note (Signed)
Established problem. Adequate blood pressure control.  No evidence of new end organ damage.  Tolerating medication without significant adverse effects.  Plan to continue current blood pressure medication regiment.   

## 2022-04-20 NOTE — Assessment & Plan Note (Signed)
Established problem Uncontrolled.  Patient's generalized ruminations increased. Patient under acute stress of liver lesions diagnostic work-up with metastatic malignancy a distinct possibility. Transient increase in patient's lorazepam 0.5 mg from 30 tab a motnh To 40 tab.  Will decrease dispense as appropriate.

## 2022-04-20 NOTE — Assessment & Plan Note (Addendum)
Established problem that has improved with improvement in left arm strength, resolution of tingling sensation and resolution of arm pain.  Inhouse neurology thought transient motor-sensory changes in left arm were secondary to a TIA in territory of previous CVA.  Strength in right UE: 4+ grip, 4+ elbow flex/ext, shoulder abdomin/add Continue anticoagulation, BP control and statin therapies

## 2022-04-27 ENCOUNTER — Encounter: Payer: Self-pay | Admitting: Family Medicine

## 2022-04-27 NOTE — Progress Notes (Signed)
Summary [facility] 04/20/22 T.Biagio Borg MD  Assessment and Plan Secondary Adrenal Insufficiency secondary chronic prednisone therapy for SLE "We will start tapering prednisone by 0.5 mg every 2 weeks. When you get down to 3 mg or 2.5 mg daily, we will recheck labs while fasting in the morning around 8 AM.   If you are having a procedure like the biopsy, you should take 3 tablets of the 5 mg prednisone tablet on that day and then resume your usual dose the following day as long as you are feeling well.  If you have fever or illness or injury, you should increase your dose back to 10 to 15 mg of prednisone (using the 5 mg prednisone tablet) daily until you are feeling better.   Plan for thyroid ultrasound once biopsy and lung workup are more stable."    "Return in about 4 months (around 08/20/2022) for secondary AI; , and 2 months for Telehealth."

## 2022-05-24 DIAGNOSIS — R911 Solitary pulmonary nodule: Secondary | ICD-10-CM | POA: Insufficient documentation

## 2022-05-24 HISTORY — DX: Solitary pulmonary nodule: R91.1

## 2022-05-28 ENCOUNTER — Telehealth: Payer: Self-pay | Admitting: *Deleted

## 2022-05-28 NOTE — Telephone Encounter (Signed)
April called on behalf of patient Francys Bolin stating that Ms. [employer] had FMLA papers faxed to our office in the last week and they wanted to make sure they were received.  If they haven't please call April back and she will reach out to the employer for patient.  If so, we can just complete and send them back to the employer.  Will forward to MD to advise.  Konrad Hoak,CMA

## 2022-05-30 NOTE — Telephone Encounter (Signed)
April/ Ms. Ventresca informed and they will reach out to employer.  Andrea Berry,CMA

## 2022-06-07 NOTE — Telephone Encounter (Signed)
April calls nurse line in regards to [facility] forms.   I did not see them in PCP box.   Will check with PCP to see if they have been received.  If not will contact the industry of the blind.

## 2022-06-08 NOTE — Telephone Encounter (Signed)
I have not seen FMLA for Ms Pacini.  I will complete a standard FMLA form from online.  Please find out from April to whom to fax, and their fax number.  Thank you.  Sherren Mocha

## 2022-06-21 ENCOUNTER — Other Ambulatory Visit: Payer: Self-pay | Admitting: Family Medicine

## 2022-06-21 DIAGNOSIS — F411 Generalized anxiety disorder: Secondary | ICD-10-CM

## 2022-06-28 ENCOUNTER — Other Ambulatory Visit (HOSPITAL_COMMUNITY)
Admission: RE | Admit: 2022-06-28 | Discharge: 2022-06-28 | Disposition: A | Discharge status: Home / Self Care | Payer: Medicare Other | Source: Ambulatory Visit | Attending: Family Medicine | Admitting: Family Medicine

## 2022-06-28 ENCOUNTER — Encounter: Payer: Self-pay | Admitting: Family Medicine

## 2022-06-28 ENCOUNTER — Ambulatory Visit (INDEPENDENT_AMBULATORY_CARE_PROVIDER_SITE_OTHER): Payer: Medicare Other | Admitting: Family Medicine

## 2022-06-28 VITALS — BP 122/77 | HR 92 | Ht 61.0 in | Wt 174.2 lb

## 2022-06-28 DIAGNOSIS — N898 Other specified noninflammatory disorders of vagina: Secondary | ICD-10-CM | POA: Diagnosis present

## 2022-06-28 DIAGNOSIS — Z23 Encounter for immunization: Secondary | ICD-10-CM | POA: Diagnosis not present

## 2022-06-28 MED ORDER — BORIC ACID VAGINAL 600 MG VA SUPP: 1.0000 | Freq: Every day | VAGINAL | 2 refills | Status: AC

## 2022-06-28 NOTE — Patient Instructions (Signed)
We are sending the vaginal sample for testing for bacteria that cause Bacterial Vaginosis and vaginal odor. We should have this test result back next week.   A prescription was sent to your pharmacy for Boric Acid vaginal suppository, 600 mg suppository.  Place one vaginal suppository in the vagina at daily at bedtime for 21 days.  There are refills of the Boric Acid prescribed should it be helpful, but the odor recurs and needs another 21 day course of Boric Acid treatment.   If we are unable to stop the odor with Boric Acid, it would be prudent to perform a pelvic exam, like with Pap smears, to see if there is anything in the vagina vault that could cause odor.  Let Dr Simcha Speir know if the Boric Acid.

## 2022-06-29 ENCOUNTER — Encounter: Payer: Self-pay | Admitting: Family Medicine

## 2022-06-29 DIAGNOSIS — N898 Other specified noninflammatory disorders of vagina: Secondary | ICD-10-CM | POA: Insufficient documentation

## 2022-06-29 HISTORY — DX: Other specified noninflammatory disorders of vagina: N89.8

## 2022-06-29 NOTE — Assessment & Plan Note (Signed)
Established problem Uncontrolled.  Patient is not at goal of odor free.  Patient was treated on 02/10/22 with 5 day of metronidazole vaginal gel without improvement  Andrea Berry is interested in trial boric acid vaginal supposity therapy.  She had Flagyl therapy in 2015.   She denies discharge, vaginal itching, vagianl pain No vaginal bleeding Not sexually active.   Andrea Berry declined pelvic examination Self-collected vaginal swab performed and sent for BV, Chlamydia, GC, trich  Prescription Boric Acid vag suppository 600 mg suppository one PV at bedtime x 21d If not improved, Andrea Berry understands that she should have a pelvic exam as part of her work up.

## 2022-06-29 NOTE — Progress Notes (Signed)
Andrea Berry is alone Sources of clinical information for visit is/are patient. Nursing assessment for this office visit was reviewed with the patient for accuracy and revision.     Previous Report(s) Reviewed: none     06/28/2022    3:54 PM  Depression screen PHQ 2/9  Decreased Interest 1  Down, Depressed, Hopeless 0  PHQ - 2 Score 1  Altered sleeping 0  Tired, decreased energy 1  Change in appetite 0  Feeling bad or failure about yourself  0  Trouble concentrating 0  Moving slowly or fidgety/restless 0  Suicidal thoughts 0  PHQ-9 Score 2  Difficult doing work/chores Not difficult at all   [place] Visit from 06/28/2022 in [place] Office Visit from 04/19/2022 in [place] Office Visit from 01/31/2022 in [place]  Thoughts that you would be better off dead, or of hurting yourself in some way Not at all Not at all Not at all  PHQ-9 Total Score '2 7 4          '$ 05/26/2020    2:39 PM 07/23/2019   11:23 AM 03/27/2019   11:08 AM 07/16/2018   12:21 PM 07/16/2018   11:16 AM  [place] in the past year? 0 1 0 0 0  Number falls in past yr: 0 0 0    Injury with Fall?  0     Risk for fall due to : Impaired vision   Impaired vision;Medication side effect;Impaired mobility        06/28/2022    3:54 PM 04/19/2022    9:21 AM 01/31/2022   10:57 AM  PHQ9 SCORE ONLY  PHQ-9 Total Score '2 7 4    '$ Adult vaccines due  Topic Date Due   TETANUS/TDAP  04/02/2029    Health Maintenance Due  Topic Date Due   COVID-19 Vaccine (5 - Pfizer risk series) 06/16/2021      History/P.E. limitations: none  Adult vaccines due  Topic Date Due   TETANUS/TDAP  04/02/2029   There are no preventive care reminders to display for this patient.  Health Maintenance Due  Topic Date Due   COVID-19 Vaccine (5 - Pfizer risk series) 06/16/2021     Chief Complaint  Patient presents with   Follow-up      --------------------------------------------------------------------------------------------------------------------------------------------- Visit Problem List with A/P  No problem-specific Assessment & Plan notes found for this encounter.

## 2022-07-02 LAB — CERVICOVAGINAL ANCILLARY ONLY
Bacterial Vaginitis (gardnerella): NEGATIVE
Candida Glabrata: NEGATIVE
Candida Vaginitis: NEGATIVE
Chlamydia: NEGATIVE
Comment: NEGATIVE
Comment: NEGATIVE
Comment: NEGATIVE
Comment: NEGATIVE
Comment: NEGATIVE
Comment: NORMAL
Neisseria Gonorrhea: NEGATIVE
Trichomonas: NEGATIVE

## 2022-07-09 ENCOUNTER — Telehealth: Payer: Self-pay

## 2022-07-09 ENCOUNTER — Telehealth: Payer: Self-pay | Admitting: Family Medicine

## 2022-07-09 NOTE — Telephone Encounter (Signed)
Patient calls nurse line reporting difficulty picking up Boric Acid.   I called the pharmacy and they reported the medication is OTC. Therefore, her insurance will not cover.   I attempted to call patient back to advise, however no answer. VM was left asking her to return my call.

## 2022-07-09 NOTE — Telephone Encounter (Signed)
Patient returns call to nurse line. Advised of message.   Patient verbalizes understanding.  Talbot Grumbling, RN

## 2022-07-09 NOTE — Telephone Encounter (Signed)
Left message about normal vaginal swab APT testing for vaginitis.

## 2022-07-27 ENCOUNTER — Other Ambulatory Visit: Payer: Self-pay | Admitting: Family Medicine

## 2022-07-27 DIAGNOSIS — K219 Gastro-esophageal reflux disease without esophagitis: Secondary | ICD-10-CM

## 2022-08-09 ENCOUNTER — Other Ambulatory Visit: Payer: Self-pay | Admitting: Family Medicine

## 2022-08-09 DIAGNOSIS — D6862 Lupus anticoagulant syndrome: Secondary | ICD-10-CM

## 2022-08-09 DIAGNOSIS — Z86711 Personal history of pulmonary embolism: Secondary | ICD-10-CM

## 2022-08-09 DIAGNOSIS — F411 Generalized anxiety disorder: Secondary | ICD-10-CM

## 2022-08-10 ENCOUNTER — Encounter: Payer: Self-pay | Admitting: Family Medicine

## 2022-08-13 ENCOUNTER — Ambulatory Visit (INDEPENDENT_AMBULATORY_CARE_PROVIDER_SITE_OTHER): Payer: Medicare Other | Admitting: Family Medicine

## 2022-08-13 VITALS — BP 132/80 | HR 122 | Temp 101.0°F | Wt 174.2 lb

## 2022-08-13 DIAGNOSIS — J189 Pneumonia, unspecified organism: Secondary | ICD-10-CM | POA: Diagnosis not present

## 2022-08-13 DIAGNOSIS — R112 Nausea with vomiting, unspecified: Secondary | ICD-10-CM | POA: Insufficient documentation

## 2022-08-13 MED ORDER — ONDANSETRON HCL 4 MG PO TABS: 4.0000 mg | ORAL_TABLET | Freq: Three times a day (TID) | ORAL | 0 refills | Status: DC | PRN

## 2022-08-13 MED ORDER — DOXYCYCLINE HYCLATE 100 MG PO TABS: 100.0000 mg | ORAL_TABLET | Freq: Two times a day (BID) | ORAL | 0 refills | Status: AC

## 2022-08-13 MED ORDER — ONDANSETRON HCL 4 MG/2ML IJ SOLN
4.0000 mg | Freq: Once | INTRAMUSCULAR | Status: AC
  Administered 2022-08-13: 4 mg via INTRAMUSCULAR

## 2022-08-13 NOTE — Patient Instructions (Signed)
It was wonderful to see you today. Thank you for allowing me to be a part of your care. Below is a short summary of what we discussed at your visit today:  Pneumonia Because of your difficulty breathing, fever, fast breathing rate, and decreased lung sounds in the right I think you have pneumonia because of your allergy to Rocephin (ceftriaxone), we are a little bit limited in our antibiotics.  I have prescribed doxycycline twice daily for 7 days.  Come back Wednesday or Thursday for follow-up.  Please schedule at the front desk.  If your breathing gets worse, please go to an urgent care or emergency room for evaluation.  Nausea and vomiting I sent in a prescription for Zofran, and antinausea medicine.  You may take this up to every 8 hours as needed for nausea.  You received a shot of Zofran today around 5 PM before you left the clinic.    Please bring all of your medications to every appointment!  If you have any questions or concerns, please do not hesitate to contact us via phone or MyChart message.   Ezequiel Essex, MD

## 2022-08-13 NOTE — Progress Notes (Signed)
    SUBJECTIVE:   CHIEF COMPLAINT / HPI:   [place] visit Ms. Glander presents with her friend for the complaint of feeling sick since Thanksgiving, now 1.5 weeks duration. She reports cough, congestion, headaches, nausea, vomiting, diarrhea and some difficulty breathing.  Difficult to tell which came first.  Febrile today to 64 F, although she does not feel hot and cannot tell me if she has been febrile before.  She has been attempting to eat and drink but not much is staying down.  She reports urine output is "okay".  Has urinated a couple times last 24 hours.  She is fully vaccinated for flu; mostly UTD on COVID, except for the last COVID booster.  Home COVID test negative.  Sick contacts: Her grandchildren, 2 of whom are school-aged, or ill when she cared for them before Thanksgiving.  One sick contact in the home, but their only symptoms are sore throat.  Has been taking Tylenol and NyQuil without much relief.  PERTINENT  PMH / PSH: SLE, lupus anticoagulant disorder, interstitial lung disease, severe OSA, HTN, cardiomyopathy, TIA, chronic left hemiparesis, beta thalassemia minor, legal blindness bilaterally  OBJECTIVE:   BP 132/80   Pulse (!) 122   Temp (!) 101 F (38.3 C)   Wt 174 lb 3.2 oz (79 kg)   SpO2 94%   BMI 32.91 kg/m    General: Awake, alert, ill-appearing, tachypneic at rest HEENT: TMs pearly pink bilaterally, nares with clear discharge, oral mucosa tacky Lymph: Palpable anterior lymphadenopathy of neck Cardiac: Tachycardic to low 100s on exam, regular rhythm, cap refill 3 seconds, normal skin turgor Respiratory: Diminished breath sounds in right posterior inferior field, otherwise clear Extremities: Skin hot to touch  ASSESSMENT/PLAN:   Pneumonia of right lower lobe due to infectious organism Unclear if primary or secondary bacterial pneumonia versus flu, COVID.  Used shared decision making to decline flu and COVID testing today given that this would not alter our  course of treatment.  Given tachypnea, tachycardia, SpO2 94%, and diminished breath sounds suspect pneumonia.  Given lupus anticoagulant disorder and patient's nausea causing her to vomit up a couple doses of Xarelto, also consider PE as cause of tachypnea and tachycardia if not responding to antibiotics.  Patient does have significant contraindication to Rocephin, listed as multiorgan inflammation with drug fever.  - Doxycycline 100 mg twice daily x 7 days - Follow-up appointment for Wednesday or Thursday - Zofran 4 mg tablets x 20 for nausea - IM Zofran 4 mg here in clinic prior to departure - Heavy emphasis on fluids - ED precautions given     Ezequiel Essex, MD [employer]

## 2022-08-13 NOTE — Assessment & Plan Note (Signed)
Unclear if primary or secondary bacterial pneumonia versus flu, COVID.  Used shared decision making to decline flu and COVID testing today given that this would not alter our course of treatment.  Given tachypnea, tachycardia, SpO2 94%, and diminished breath sounds suspect pneumonia.  Given lupus anticoagulant disorder and patient's nausea causing her to vomit up a couple doses of Xarelto, also consider PE as cause of tachypnea and tachycardia if not responding to antibiotics.  Patient does have significant contraindication to Rocephin, listed as multiorgan inflammation with drug fever.  - Doxycycline 100 mg twice daily x 7 days - Follow-up appointment for Wednesday or Thursday - Zofran 4 mg tablets x 20 for nausea - IM Zofran 4 mg here in clinic prior to departure - Heavy emphasis on fluids - ED precautions given

## 2022-08-16 ENCOUNTER — Ambulatory Visit (INDEPENDENT_AMBULATORY_CARE_PROVIDER_SITE_OTHER): Payer: Medicare Other | Admitting: Family Medicine

## 2022-08-16 VITALS — BP 102/64 | HR 85 | Temp 98.6°F | Ht 61.0 in | Wt 174.0 lb

## 2022-08-16 DIAGNOSIS — J189 Pneumonia, unspecified organism: Secondary | ICD-10-CM

## 2022-08-16 NOTE — Patient Instructions (Addendum)
It was wonderful to see you today. Thank you for allowing me to be a part of your care. Below is a short summary of what we discussed at your visit today:  Pneumonia I am so incredibly glad you are feeling much better! Keep doing what you are doing.  Finish out the antibiotic as prescribed. Keep using the Zofran to help with nausea and keep fluids down. Focus on hydration until you are feeling back to normal.   Please bring all of your medications to every appointment!  If you have any questions or concerns, please do not hesitate to contact us via phone or MyChart message.   Ezequiel Essex, MD

## 2022-08-16 NOTE — Progress Notes (Signed)
    SUBJECTIVE:   CHIEF COMPLAINT / HPI:   Pneumonia follow up Last seen by myself on 12/04 for febrile respiratory illness, diagnosed with pneumonia, prescribed doxycycline BID x7 days (given rocephin allergy). Also prescribed zofran for nausea to aid in fluid intake.   Today Andrea Berry presents for follow up with her friend. She reports feeling much better on a couple days of the doxycycline. She has not been febrile since the last office visit and is keeping down fluids well with the zofran. Normal UOP. Breathing much better and overall more comfortable.   PERTINENT  PMH / PSH: SLE, lupus anticoagulant disorder, interstitial lung disease, severe OSA, HTN, cardiomyopathy, TIA, chronic left hemiparesis, beta thalassemia minor, legal blindness bilaterally   OBJECTIVE:   BP 102/64   Pulse 85   Temp 98.6 F (37 C)   Ht '5\' 1"'$  (1.549 m)   Wt 174 lb (78.9 kg)   SpO2 96%   BMI 32.88 kg/m    Gen: awake, alert, NAD HEENT: moist oral mucosa Cardiac: RRR, no murmur, brisk cap refill, normal skin turgor Resp: clear on left A/P, quiet rhonchi on right postseriorly  ASSESSMENT/PLAN:   Pneumonia of right lower lobe due to infectious organism Interval check. Much improved over the last two days on antibiotics. No longer febrile, improved respiratory ease and increased fluid intake. Appears to be going in the right direction. Will continue with plan. Return precautions given.     Ezequiel Essex, MD [place]

## 2022-08-18 NOTE — Assessment & Plan Note (Signed)
Interval check. Much improved over the last two days on antibiotics. No longer febrile, improved respiratory ease and increased fluid intake. Appears to be going in the right direction. Will continue with plan. Return precautions given.

## 2022-08-27 ENCOUNTER — Encounter: Payer: Self-pay | Admitting: Family Medicine

## 2022-08-27 ENCOUNTER — Telehealth: Payer: Self-pay

## 2022-08-27 ENCOUNTER — Ambulatory Visit (INDEPENDENT_AMBULATORY_CARE_PROVIDER_SITE_OTHER): Payer: Medicare Other | Admitting: Student

## 2022-08-27 VITALS — BP 116/37 | HR 108 | Ht 61.0 in | Wt 171.0 lb

## 2022-08-27 DIAGNOSIS — E2749 Other adrenocortical insufficiency: Secondary | ICD-10-CM | POA: Diagnosis not present

## 2022-08-27 MED ORDER — PREDNISONE 10 MG PO TABS: 15.0000 mg | ORAL_TABLET | Freq: Every day | ORAL | 0 refills | Status: AC

## 2022-08-27 NOTE — Progress Notes (Signed)
    SUBJECTIVE:   CHIEF COMPLAINT / HPI: Weakness, nausea  Ativan 1 pill every other day. Has been using zofran but not working. Taking 2 mg prednisone last wean was a couple weeks ago, no recent decrease.   Having shortness of breath (when walking), no chest pain, a little coughing up some phelgm. No vomiting but has been having nausea all the time no time that . No headaches. No abdominal pain but has had diarrhea for a week. Son has also been been sick with a cold recently with sore throat. Does have some dizziness when standing up. Drinking a lot less than normal for whole week. Peeing her normal amount. Not eating as well. No leg swelling, pleuritic chest pain or calf pain. Has been complaining of fatigue and dizziness and nausea for one week that has been getting worse. No fevers that she knows of. Had fevers on the 4th and 7th when she saw Dr. Jeani Hawking for presumed CAP.  Hx of presumed secondary adrenal insufficiency due to chronic prednisone use for lupus.  Currently on 2 mg dose of prednisone. Last wean was a couple of weeks ago per patient. Note from endocrinologist 04/20/2022 showed that there can be some lightheadedness and nausea while body adapts to start making insulin and steroid.  Also had mentioned in note to take 10 mg daily of prednisone if feeling sick or under stress and if having fever to take 15 mg daily until feeling well again.  And then in the emergency if she could not take her prednisone due to vomiting to do the hydrocortisone 100 mg injection.  Per rheumatology note on 05/07/2022 they started tapering prednisone dose down by 0.5 mg every 2 weeks.  Denies any chest pain, headaches, swelling, pleuritic chest pain, fevers.  PERTINENT  PMH / PSH: SLE on rituximab, ILD, secondary adrenal insufficiency  OBJECTIVE:   BP (!) 116/37   Pulse (!) 108   Ht '5\' 1"'$  (1.549 m)   Wt 171 lb (77.6 kg)   SpO2 100%   BMI 32.31 kg/m   General: Nontoxic but fatigued, NAD, awake, alert,  responsive to questions Head: Normocephalic atraumatic, sunglasses on, MMM, no oropharyngeal exudates CV: Regular rate and rhythm Respiratory: Clear to ausculation bilaterally, no wheezes rales or crackles, chest rises symmetrically,  no increased work of breathing Abdomen: Soft, non-tender, non-distended, normoactive bowel sounds  Extremities: Moves upper and lower extremities freely, no edema in LE Neuro: No focal deficits Skin: No rashes or lesions visualized   ASSESSMENT/PLAN:   Secondary adrenal insufficiency (HCC) Patient presents with 1 week of fatigue, diarrhea, nausea that is not responsive to antiemetic.  She has been slowly weaning her prednisone dose per endocrinology and rheumatology and 2 mg daily prednisone.  Rituximab as well and patient is immunosuppressed.  She has not been drinking as much is normal but seems to be compensating.  I think most likely she is having adrenal insufficiency that is causing her symptoms possibly from the weaning versus getting her son's cold and having worsening symptoms.  -Increase prednisone to 15 mg daily-Rx sent to pharmacy -Follow-up with patient on Friday 12/22 to ensure she is improving and come up with plan for taper   Gerrit Heck, MD [place]

## 2022-08-27 NOTE — Telephone Encounter (Signed)
Patients niece April calls nurse line requesting an apt today.   She reports she has been having dizzy spells associated with nausea and fatigue. She reports she took her to urgent care, however the wait time was 3+ hours.   She denies any fevers, vomiting, vision changes or chest pains. She denies any recent trauma or falls.   April reports she "looks unwell and I do not want to have to take her to the ED."   Patient scheduled for this afternoon for evaluation.

## 2022-08-27 NOTE — Assessment & Plan Note (Signed)
Patient presents with 1 week of fatigue, diarrhea, nausea that is not responsive to antiemetic.  She has been slowly weaning her prednisone dose per endocrinology and rheumatology and 2 mg daily prednisone.  Rituximab as well and patient is immunosuppressed.  She has not been drinking as much is normal but seems to be compensating.  I think most likely she is having adrenal insufficiency that is causing her symptoms possibly from the weaning versus getting her son's cold and having worsening symptoms.  -Increase prednisone to 15 mg daily-Rx sent to pharmacy -Follow-up with patient on Friday 12/22 to ensure she is improving and come up with plan for taper

## 2022-08-27 NOTE — Patient Instructions (Signed)
It was great to see you! Thank you for allowing me to participate in your care!   Our plans for today:  -What you are describing may be a component of adrenal insufficiency/too quick of a wean on your prednisone plus possibly getting a viral illness from your son-I would like to increase her prednisone to 15 mg daily and follow up with you on Friday  Take care and seek immediate care sooner if you develop any concerns.  Gerrit Heck, MD

## 2022-08-31 ENCOUNTER — Encounter: Payer: Self-pay | Admitting: Student

## 2022-08-31 ENCOUNTER — Ambulatory Visit (INDEPENDENT_AMBULATORY_CARE_PROVIDER_SITE_OTHER): Payer: Medicare Other | Admitting: Student

## 2022-08-31 VITALS — BP 118/60 | HR 80 | Wt 174.0 lb

## 2022-08-31 DIAGNOSIS — E2749 Other adrenocortical insufficiency: Secondary | ICD-10-CM

## 2022-08-31 MED ORDER — PREDNISONE 10 MG PO TABS: 15.0000 mg | ORAL_TABLET | Freq: Every day | ORAL | 0 refills | Status: DC

## 2022-08-31 NOTE — Progress Notes (Signed)
    SUBJECTIVE:   CHIEF COMPLAINT / HPI:   Seen on 12/18 for likely adrenal insufficiency symptoms in the setting of possible cold from as well as taper of chronic prednisone from rheumatology/endocrinology.  Was given prednisone course of 15 mg daily.  Today she feels much better.  He is smiling in the room and denies any nausea, fatigue, shortness of breath today.  She has not had any sick symptoms and said after taking the first few doses of prednisone that she has felt completely back to normal and was feeling great.  PERTINENT  PMH / PSH: Secondary adrenal insufficiency due to chronic prednisone use, SLE on rituximab, ILD  OBJECTIVE:   BP 118/60   Pulse 80   Wt 174 lb (78.9 kg)   SpO2 97%   BMI 32.88 kg/m   General: Well appearing, NAD, awake, alert, responsive to questions Head: Normocephalic atraumatic, sunglasses on CV: Regular rate and rhythm no murmurs rubs or gallops Respiratory: Clear to ausculation bilaterally, no wheezes rales or crackles, chest rises symmetrically,  no increased work of breathing Abdomen: Soft, non-tender, non-distended, normoactive bowel sounds  Extremities: Moves upper and lower extremities freely, no edema in LE Neuro: No focal deficits Skin: No rashes or lesions visualized   ASSESSMENT/PLAN:   Secondary adrenal insufficiency (HCC) Patient much improved after 15 mg prednisone dose change.  I discussed with attending Dr. Owens Shark and decided to continue patient on 15 mg prednisone dose until endocrine appointment on 1/19.  I have sent in her refills for this. -15 mg prednisone dose daily -Endocrinologist appointment 1/19 to address taper -Continue PPI medication alongside this.    Gerrit Heck, MD [place]

## 2022-08-31 NOTE — Assessment & Plan Note (Signed)
Patient much improved after 15 mg prednisone dose change.  I discussed with attending Dr. Owens Shark and decided to continue patient on 15 mg prednisone dose until endocrine appointment on 1/19.  I have sent in her refills for this. -15 mg prednisone dose daily -Endocrinologist appointment 1/19 to address taper -Continue PPI medication alongside this.

## 2022-08-31 NOTE — Patient Instructions (Signed)
It was great to see you! Thank you for allowing me to participate in your care!   Our plans for today:  - Please continue taking 15 mg of prednisone until your appointment with endocrinologist at 09/28/2021. I have sent in refills -Make sure to continue your reflux medicine alongside this  Take care and seek immediate care sooner if you develop any concerns.  Gerrit Heck, MD

## 2022-09-14 DIAGNOSIS — N952 Postmenopausal atrophic vaginitis: Secondary | ICD-10-CM

## 2022-09-14 DIAGNOSIS — N200 Calculus of kidney: Secondary | ICD-10-CM | POA: Insufficient documentation

## 2022-09-14 DIAGNOSIS — R3129 Other microscopic hematuria: Secondary | ICD-10-CM | POA: Insufficient documentation

## 2022-09-14 HISTORY — DX: Calculus of kidney: N20.0

## 2022-09-14 HISTORY — DX: Postmenopausal atrophic vaginitis: N95.2

## 2022-10-16 ENCOUNTER — Telehealth: Payer: Self-pay

## 2022-10-16 NOTE — Telephone Encounter (Signed)
April calls nurse in regards to patients up coming gastro procedure.   She reports Dr. Rolm Bookbinder office faxed over a form to hold patients blood thinner.   I did not see this is in PCP box.   Will forward to PCP.

## 2022-10-17 NOTE — Telephone Encounter (Signed)
I completed the form for a specialist recently where about her Laqueta Linden. I recall because I recommended patient taking Lovenox injections while off of her Laqueta Linden. I rarely do this.   If they cannot find the form from me, they may want to send it again.

## 2022-10-18 NOTE — Telephone Encounter (Signed)
Do you recall faxing this out?

## 2022-10-19 NOTE — Telephone Encounter (Signed)
I have been out of office. If it was faxed it would be in out going fax pile. I can take a look and let you know

## 2022-10-19 NOTE — Telephone Encounter (Signed)
Received returned call from [facility]. She reports that colonoscopy and endoscopy has been scheduled for 3/21.  They were making our office aware in order to facilitate the Lovenox bridge.   Will forward to PCP for further management.   Talbot Grumbling, RN

## 2022-10-19 NOTE — Telephone Encounter (Signed)
Are they expecting [place] to arrange the lovenox bridging?

## 2022-10-19 NOTE — Telephone Encounter (Signed)
Andrea Berry has been updated. She will let me know if form is still not received.

## 2022-10-19 NOTE — Telephone Encounter (Signed)
The form was originally faxed back to them on 02/06. I will refax form now.

## 2022-10-22 NOTE — Telephone Encounter (Signed)
Alright, will help with bridging.  Could you find out when the endoscopic procedure is scheduled?

## 2022-10-23 ENCOUNTER — Ambulatory Visit (INDEPENDENT_AMBULATORY_CARE_PROVIDER_SITE_OTHER): Payer: Medicare Other

## 2022-10-23 ENCOUNTER — Telehealth: Payer: Self-pay

## 2022-10-23 DIAGNOSIS — Z23 Encounter for immunization: Secondary | ICD-10-CM | POA: Diagnosis not present

## 2022-10-23 DIAGNOSIS — K769 Liver disease, unspecified: Secondary | ICD-10-CM

## 2022-10-23 NOTE — Telephone Encounter (Signed)
Patients presents to nurse clinic today for covid vaccine.   Patient is requesting a GI referral to local provider. April reports her current provider is in [place] and they would prefer someone closer. She also reports patients scheduled colonoscopy is not until the end of March. She reports this is the soonest available for [facility] provider. They do not want to wait that long.   Will forward to PCP to place referral.

## 2022-10-26 NOTE — Addendum Note (Signed)
Addended byWendy Poet, Aydn Ferrara D on: 10/26/2022 01:00 PM   Modules accepted: Orders

## 2022-10-27 ENCOUNTER — Other Ambulatory Visit: Payer: Self-pay | Admitting: Student

## 2022-10-27 DIAGNOSIS — E2749 Other adrenocortical insufficiency: Secondary | ICD-10-CM

## 2022-10-29 NOTE — Telephone Encounter (Signed)
Open in error

## 2022-11-02 NOTE — Telephone Encounter (Signed)
Andrea Berry will need to receive further refills from her rheumatologist.

## 2022-11-27 ENCOUNTER — Telehealth: Payer: Self-pay | Admitting: Family Medicine

## 2022-11-27 NOTE — Telephone Encounter (Signed)
Called patient to schedule Medicare Annual Wellness Visit (AWV). No voicemail available to leave a message.  Last date of AWV: 07/16/2018   Please schedule an AWVS appointment at any time with [place].  If any questions, please contact me at [phone].   Thank you,  [place] [facility]  [phone]

## 2022-11-28 ENCOUNTER — Other Ambulatory Visit: Payer: Self-pay | Admitting: Family Medicine

## 2022-11-28 DIAGNOSIS — E2749 Other adrenocortical insufficiency: Secondary | ICD-10-CM

## 2022-11-28 NOTE — Telephone Encounter (Signed)
Please ask Ms Blanke to request refills of her prednisone from her endocrinologist, Grandville Silos Sheran Luz, MD ([place]) at [phone] (Work).

## 2022-11-29 ENCOUNTER — Ambulatory Visit: Payer: Medicare Other | Admitting: Family Medicine

## 2022-12-04 ENCOUNTER — Telehealth: Payer: Self-pay

## 2022-12-04 NOTE — Telephone Encounter (Signed)
Received call from Manuela Schwartz, [place] at [facility] regarding results of colonoscopy. Manuela Schwartz reports that rectal polyp was found and further evaluation is needed. Patient is in the process of being scheduled for endoscopic rectal ultrasound.   RN reports that patient will need to hold Xarelto again for 2-3 days prior to procedure. For colonoscopy, patient needed lovenox bridge.   She states that she will call back once this is scheduled so that we can facilitate this.   She just wanted to give provider update.   Will forward to Dr. McDiarmid.   Talbot Grumbling, RN

## 2022-12-04 NOTE — Telephone Encounter (Signed)
Please let the GI office know the patient may be off Xareltro for 3 days before procedure without bridging.

## 2022-12-06 NOTE — Telephone Encounter (Signed)
Patient may hold Andrea Berry for 3 days prior to colonoscopy.  She is at low risk of embolic event for this short a duration.

## 2022-12-06 NOTE — Telephone Encounter (Signed)
Spoke with [facility] nurse Cristy Friedlander I informed her of verbal note left by Dr. McDiarmid and I also faxed over orders as well to [phone]. Salvatore Marvel, CMA

## 2022-12-09 ENCOUNTER — Other Ambulatory Visit: Payer: Self-pay | Admitting: Family Medicine

## 2022-12-09 DIAGNOSIS — E2749 Other adrenocortical insufficiency: Secondary | ICD-10-CM

## 2022-12-09 DIAGNOSIS — M8580 Other specified disorders of bone density and structure, unspecified site: Secondary | ICD-10-CM

## 2022-12-12 NOTE — Telephone Encounter (Signed)
I was unable to reach Andrea Berry by phone.   I spoke with Andrea Berry, son of patient and her emergency contact, about patient requesting refill of prednisone early and her needing to request from her endocrinologist.  Review of Andrea Berry's endocrinologist's last consult ([facility] Silos at [facility]) on 11/02/22 finds a recommendation that Andrea Berry take 5 mg daily of prednisone.    Andrea Berry requested an early refill of prednisone from our office.  I am concerned she may be taking more daily prednisone than recommended.   I asked that Andrea Berry contact his mother to tell her, 1) to request her prednisone refills from her endocrinologist  2) to take only half a 10 mg tablet (5 mg) daily as per her recent endocrinologist's recommendation.  Andrea Berry said he would get the instructions to his mother.

## 2023-01-03 ENCOUNTER — Ambulatory Visit (INDEPENDENT_AMBULATORY_CARE_PROVIDER_SITE_OTHER): Payer: Medicare Other | Admitting: Family Medicine

## 2023-01-03 ENCOUNTER — Encounter: Payer: Self-pay | Admitting: Family Medicine

## 2023-01-03 ENCOUNTER — Other Ambulatory Visit (HOSPITAL_COMMUNITY)
Admission: RE | Admit: 2023-01-03 | Discharge: 2023-01-03 | Disposition: A | Discharge status: Home / Self Care | Payer: Medicare Other | Source: Ambulatory Visit | Attending: Family Medicine | Admitting: Family Medicine

## 2023-01-03 VITALS — BP 137/64 | HR 84 | Ht 61.0 in | Wt 176.2 lb

## 2023-01-03 DIAGNOSIS — D563 Thalassemia minor: Secondary | ICD-10-CM

## 2023-01-03 DIAGNOSIS — D509 Iron deficiency anemia, unspecified: Secondary | ICD-10-CM

## 2023-01-03 DIAGNOSIS — Z7901 Long term (current) use of anticoagulants: Secondary | ICD-10-CM

## 2023-01-03 DIAGNOSIS — R7303 Prediabetes: Secondary | ICD-10-CM

## 2023-01-03 DIAGNOSIS — I10 Essential (primary) hypertension: Secondary | ICD-10-CM | POA: Diagnosis not present

## 2023-01-03 DIAGNOSIS — N898 Other specified noninflammatory disorders of vagina: Secondary | ICD-10-CM | POA: Diagnosis present

## 2023-01-03 DIAGNOSIS — Z79899 Other long term (current) drug therapy: Secondary | ICD-10-CM | POA: Diagnosis not present

## 2023-01-03 LAB — POCT GLYCOSYLATED HEMOGLOBIN (HGB A1C): HbA1c, POC (prediabetic range): 6.1 % (ref 5.7–6.4)

## 2023-01-03 NOTE — Patient Instructions (Addendum)
  Start using [employer], hospital system.   It is a medical grade stainless steel douching device that showed improvement in odor after 4 weeks of daily tap water douching.

## 2023-01-04 ENCOUNTER — Encounter: Payer: Self-pay | Admitting: Family Medicine

## 2023-01-04 DIAGNOSIS — Z7901 Long term (current) use of anticoagulants: Secondary | ICD-10-CM | POA: Insufficient documentation

## 2023-01-04 HISTORY — DX: Long term (current) use of anticoagulants: Z79.01

## 2023-01-04 LAB — CBC
Hematocrit: 39 % (ref 34.0–46.6)
Hemoglobin: 11.5 g/dL (ref 11.1–15.9)
MCH: 21.5 pg — ABNORMAL LOW (ref 26.6–33.0)
MCHC: 29.5 g/dL — ABNORMAL LOW (ref 31.5–35.7)
MCV: 73 fL — ABNORMAL LOW (ref 79–97)
Platelets: 316 10*3/uL (ref 150–450)
RBC: 5.36 x10E6/uL — ABNORMAL HIGH (ref 3.77–5.28)
RDW: 14.8 % (ref 11.7–15.4)
WBC: 5.2 10*3/uL (ref 3.4–10.8)

## 2023-01-04 LAB — BASIC METABOLIC PANEL
BUN/Creatinine Ratio: 12 (ref 9–23)
BUN: 13 mg/dL (ref 6–24)
CO2: 20 mmol/L (ref 20–29)
Calcium: 9.6 mg/dL (ref 8.7–10.2)
Chloride: 105 mmol/L (ref 96–106)
Creatinine, Ser: 1.05 mg/dL — ABNORMAL HIGH (ref 0.57–1.00)
Glucose: 84 mg/dL (ref 70–99)
Potassium: 4 mmol/L (ref 3.5–5.2)
Sodium: 143 mmol/L (ref 134–144)
eGFR: 63 mL/min/{1.73_m2} (ref >59)

## 2023-01-04 NOTE — Assessment & Plan Note (Addendum)
Established problem. Stable.  CBC:    Component Value Date/Time   WBC 5.2 01/03/2023 1742   WBC 5.1 04/13/2022 0639   HGB 11.5 01/03/2023 1742   HCT 39.0 01/03/2023 1742   PLT 316 01/03/2023 1742   MCV 73 (L) 01/03/2023 1742   NEUTROABS 4.5 04/12/2022 0935   NEUTROABS 3.3 01/12/2019 1038   LYMPHSABS 0.8 04/12/2022 0935   LYMPHSABS 1.8 01/12/2019 1038   MONOABS 0.6 04/12/2022 0935   EOSABS 0.0 04/12/2022 0935   EOSABS 0.1 01/12/2019 1038   BASOSABS 0.0 04/12/2022 0935   BASOSABS 0.0 01/12/2019 1038     No signs of complications, medication side effects, or red flags. Continue monitoring intermittently

## 2023-01-04 NOTE — Assessment & Plan Note (Signed)
Established problem CBC:    Component Value Date/Time   WBC 5.2 01/03/2023 1742   WBC 5.1 04/13/2022 0639   HGB 11.5 01/03/2023 1742   HCT 39.0 01/03/2023 1742   PLT 316 01/03/2023 1742   MCV 73 (L) 01/03/2023 1742   NEUTROABS 4.5 04/12/2022 0935   NEUTROABS 3.3 01/12/2019 1038   LYMPHSABS 0.8 04/12/2022 0935   LYMPHSABS 1.8 01/12/2019 1038   MONOABS 0.6 04/12/2022 0935   EOSABS 0.0 04/12/2022 0935   EOSABS 0.1 01/12/2019 1038   BASOSABS 0.0 04/12/2022 0935   BASOSABS 0.0 01/12/2019 1038    Stable hgb Continue Rivaroxaban for Lupus Atnicoagulation (+) with

## 2023-01-04 NOTE — Assessment & Plan Note (Signed)
Established problem. Stable.  Alc 6.1%.  [place] in prediabetes range No signs of complications, medication side effects, or red flags. Continue diet

## 2023-01-04 NOTE — Assessment & Plan Note (Signed)
Established problem Well Controlled and is at goal of <140/90. Basic Metabolic Panel:    Component Value Date/Time   NA 143 01/03/2023 1742   K 4.0 01/03/2023 1742   CL 105 01/03/2023 1742   CO2 20 01/03/2023 1742   BUN 13 01/03/2023 1742   CREATININE 1.05 (H) 01/03/2023 1742   GLUCOSE 84 01/03/2023 1742   GLUCOSE 79 04/13/2022 0639   CALCIUM 9.6 01/03/2023 1742  Slight elevation in serum creatinine but within lab var  No signs of complications, medication side effects, or red flags. Continue current medications and other regiments.

## 2023-01-04 NOTE — Progress Notes (Signed)
Andrea Berry is alone Sources of clinical information for visit is/are patient and past medical records. Nursing assessment for this office visit was reviewed with the patient for accuracy and revision.     Previous Report(s) Reviewed: GYN consult 01/2022 for vaginal odor & Pap     08/27/2022    3:17 PM  Depression screen PHQ 2/9  Decreased Interest 1  Down, Depressed, Hopeless 0  PHQ - 2 Score 1  Altered sleeping 1  Tired, decreased energy 1  Change in appetite 1  Feeling bad or failure about yourself  0  Trouble concentrating 0  Moving slowly or fidgety/restless 0  Suicidal thoughts 0  PHQ-9 Score 4  Difficult doing work/chores Somewhat difficult   Flowsheet Row Office Visit from 08/27/2022 in [facility] Office Visit from 08/13/2022 in [facility] Office Visit from 06/28/2022 in [facility]  Thoughts that you would be better off dead, or of hurting yourself in some way Not at all Not at all Not at all  PHQ-9 Total Score 4 15 2           05/26/2020    2:39 PM 07/23/2019   11:23 AM 03/27/2019   11:08 AM 07/16/2018   12:21 PM 07/16/2018   11:16 AM  Fall Risk   Falls in the past year? 0 1 0 0 0  Number falls in past yr: 0 0 0    Injury with Fall?  0     Risk for fall due to : Impaired vision   Impaired vision;Medication side effect;Impaired mobility        08/27/2022    3:17 PM 08/13/2022    4:33 PM 06/28/2022    3:54 PM  PHQ9 SCORE ONLY  PHQ-9 Total Score 4 15 2     There are no preventive care reminders to display for this patient.  Health Maintenance Due  Topic Date Due   Medicare Annual Wellness (AWV)  07/17/2019      History/P.E. limitations: none  There are no preventive care reminders to display for this patient. There are no preventive care reminders to display for this patient.  Health Maintenance Due  Topic Date Due   Medicare Annual Wellness (AWV)  07/17/2019     Chief  Complaint  Patient presents with   Vaginal Odor    GYN visit 01/31/22 CC Pap & vag odor Pap OK Treatment empiric with Flagyl  Visit [facility] ([facility]) 06/28/22 CC Vag Odor "Uncontrolled.  Patient is not at goal of odor free.  Patient was treated on 02/10/22 with 5 day of metronidazole vaginal gel without improvement   Andrea Berry is interested in trial boric acid vaginal supposity therapy.  She had empiric trial Flagyl therapy in 05/02/22 for vaginal odor wo improvement   She denies discharge, vaginal itching, vagianl pain No vaginal bleeding Not sexually active.    Andrea Berry declined pelvic examination Self-collected vaginal swab performed and sent for BV, Chlamydia, GC, trich   Prescription Boric Acid vag suppository 600 mg suppository one PV at bedtime x 21d If not improved, Andrea Berry understands that she should have a pelvic exam as part of her work up."  Today Odor not present when using Boric acid vag suppository Patient dn want to continually have to use suppositories. Odor returns when not using supp No discharge  PE GU Normal external female genitalia. No evidence of perineal/vulva skin damage Vaginal mucosa not atrophic Normal appearing cervix and os  Scant opaque fluid in posterior fornix No appreciable odor Apitma swab obtained --------------------------------------------------------------------------------------------------------------------------------------------- Visit Problem List with A/P  No problem-specific Assessment & Plan notes found for this encounter.

## 2023-01-04 NOTE — Assessment & Plan Note (Signed)
Established problem Uncontrolled.  Patient is not at goal of being without odor. No response to Flagyl trial x 2 within last year No evidence of vaginal nor cerivcal lesions Scant fluid in vaginal vault APTIMA swab testing pend  A/ Persistent genital odor per patient         - No identifiable abnormality in work-up to date.          P/ F/U APTIMA swab testing Medical Grade stainless steel douching device trial (Water[employer] Douching Device) with douching once daily for 4 weeks. RTC in four weeks to assess control of symptoms

## 2023-01-04 NOTE — Assessment & Plan Note (Addendum)
Stable beta thal minor  CBC:    Component Value Date/Time   WBC 5.2 01/03/2023 1742   WBC 5.1 04/13/2022 0639   HGB 11.5 01/03/2023 1742   HCT 39.0 01/03/2023 1742   PLT 316 01/03/2023 1742   MCV 73 (L) 01/03/2023 1742   NEUTROABS 4.5 04/12/2022 0935   NEUTROABS 3.3 01/12/2019 1038   LYMPHSABS 0.8 04/12/2022 0935   LYMPHSABS 1.8 01/12/2019 1038   MONOABS 0.6 04/12/2022 0935   EOSABS 0.0 04/12/2022 0935   EOSABS 0.1 01/12/2019 1038   BASOSABS 0.0 04/12/2022 0935   BASOSABS 0.0 01/12/2019 1038    Stable microcytic anemia No change Monitor

## 2023-01-07 LAB — CERVICOVAGINAL ANCILLARY ONLY
Bacterial Vaginitis (gardnerella): NEGATIVE
Candida Glabrata: NEGATIVE
Candida Vaginitis: NEGATIVE
Chlamydia: NEGATIVE
Comment: NEGATIVE
Comment: NEGATIVE
Comment: NEGATIVE
Comment: NEGATIVE
Comment: NEGATIVE
Comment: NORMAL
Neisseria Gonorrhea: NEGATIVE
Trichomonas: NEGATIVE

## 2023-01-24 DIAGNOSIS — D3A026 Benign carcinoid tumor of the rectum: Secondary | ICD-10-CM | POA: Insufficient documentation

## 2023-01-24 HISTORY — DX: Benign carcinoid tumor of the rectum: D3A.026

## 2023-02-03 ENCOUNTER — Other Ambulatory Visit: Payer: Self-pay | Admitting: Family Medicine

## 2023-02-03 DIAGNOSIS — E2749 Other adrenocortical insufficiency: Secondary | ICD-10-CM

## 2023-02-11 ENCOUNTER — Ambulatory Visit
Admission: EM | Admit: 2023-02-11 | Discharge: 2023-02-11 | Disposition: A | Discharge status: Home / Self Care | Payer: Medicare Other | Attending: Family Medicine | Admitting: Family Medicine

## 2023-02-11 DIAGNOSIS — J069 Acute upper respiratory infection, unspecified: Secondary | ICD-10-CM

## 2023-02-11 DIAGNOSIS — J019 Acute sinusitis, unspecified: Secondary | ICD-10-CM | POA: Diagnosis not present

## 2023-02-11 MED ORDER — PROMETHAZINE-DM 6.25-15 MG/5ML PO SYRP: 5.0000 mL | ORAL_SOLUTION | Freq: Three times a day (TID) | ORAL | 0 refills | Status: DC | PRN

## 2023-02-11 MED ORDER — DOXYCYCLINE HYCLATE 100 MG PO CAPS: 100.0000 mg | ORAL_CAPSULE | Freq: Two times a day (BID) | ORAL | 0 refills | Status: DC

## 2023-02-11 NOTE — ED Triage Notes (Signed)
Patient here today with c/o cough, ST, runny nose, sweats, and loss of appetite X 1 week. She has tried taking Nyquil, Theraflu, Benadryl, and Tylenol with no relief. No sick contacts. No recent travel.

## 2023-02-11 NOTE — Discharge Instructions (Signed)
Take all medication as prescribed.  If any of your symptoms worsen or you do not experience significant improvement after being on your antibiotics for total 5 days return for evaluation.  If you develop any difficulty breathing or chest tightness or rapid heart rate go immediately to the emergency department.  Continue to hydrate well with fluids and rest.

## 2023-02-11 NOTE — ED Provider Notes (Signed)
[facility]    CSN: [id number] Arrival date & time: 02/11/23  4098      History   Chief Complaint Chief Complaint  Patient presents with   Cough    HPI Andrea Berry is a 57 y.o. female.   HPI SINUSITIS Patient presents with concern for possible sinus infection. Onset: 7 days. This is a recurrent problem that patient reports occurring at least 1-2 times per year. Endorse facial pressure, frontal headache, ear pressure, ST, copious mucus. Denies fever,  chest tightness, eye irritation,  or GI symptoms. Attempted relief with OTC medication without any relief of symptoms.  It is unknown on smoker but has a history of respiratory failure and pneumonia.  Taken multiple over-the-counter medications without any relief of symptoms. Remainder of Review of Systems negative except as noted in the HPI.    Past Medical History:  Diagnosis Date   Acute left hemiparesis on chronic left hemiparesis 04/12/2022   Acute respiratory distress syndrome (ARDS) due to COVID-19 virus (HCC) 03/27/2019   Acute respiratory failure with hypoxia (HCC) 02/16/2020   Anemia of chronic disease 10/28/2015   Anxiety 07/12/2018   Arthritis    Ataxia 01/03/2016   Blind    Chronic central neuropathic pain 10/02/2018   Dx by [facility] 09/2017   Chronic coughing 09/10/2016   Chronic pain syndrome 07/12/2018   Chronic respiratory failure with hypoxia (HCC)    Chronic rhinitis 01/07/2019   COVID-19 02/04/2020   Decreased strength 10/08/2018   Proximal leg muscles   Depression    Depression with anxiety 01/03/2016   Devic's disease (HCC)    Dysesthesia 01/03/2016   Dyspnea on exertion 11/07/2018   Elevated BP without diagnosis of hypertension 08/28/2019   Report of [facility] nurse in December with normal SBPs but DBPs in low 90s    Eustachian tube dysfunction 11/18/2018   L>R. Followed by [facility].   Eustachian tube dysfunction, bilateral 09/18/2018   Eustachian tube dysfunction, left  11/13/2018   L>R. Followed by [facility].   GERD (gastroesophageal reflux disease)    Greater trochanteric bursitis of left hip 04/27/2020   Formatting of this note might be different from the original. Added automatically from request for surgery [id number]  Formatting of this note might be different from the original. Added automatically from request for surgery [id number]   Headache(784.0)    History of chicken pox 10/08/2018   History of COVID-19 02/16/2020   History of CVA (cerebrovascular accident)    History of kidney stones    History of pulmonary embolism 12/10/2018   Lupus Antiphosholipid (+). Patient on secondary prophylaxis with rivaroxaban 20 mg daily long-term.     Hypertension    Hypertrophic cardiomyopathy (HCC) 03/02/2020   Hyponatremia 11/25/2015   Impairment of balance 10/08/2018   Legal blindness 01/30/2014   Long-term corticosteroid use 10/08/2018   Lupus (HCC)    Lupus anticoagulant disorder (HCC) 10/02/2018   Pulmonary Embolism 2017   Lupus cerebritis (HCC) 11/25/2015   Possible explanation of 11/25/2015 acute hyperintensity findings on Brain MRI.    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   Orthostatic dizziness, Recurrent 10/08/2018   Associated with medications   Otalgia 07/12/2018   Personal history of immunosupression therapy 10/08/2018   Chronic Azathioprine and prednisone therapy for Devic's disease   Pigmented skin lesion of uncertain nature 11/06/2018   Pneumonia 02/21/2020   Pneumonia of right lower lobe due to infectious organism 02/21/2020   Postmenopause 10/21/2020   Pulmonary embolism (HCC) 2017  Shortness of breath    due to medication   Skin lesion of chest wall 11/07/2018   SLE (systemic lupus erythematosus related syndrome) (HCC) 11/07/2018   Smith antibody positive 04/07/2019   Stroke (HCC) 2008   visually impaired   Stroke (HCC) 04/13/2022   Syncope    Transaminasemia 10/21/2020   Ureteral calculi 2018   Vaginal odor  06/29/2022   Vitamin D deficiency 10/06/2018    Patient Active Problem List   Diagnosis Date Noted   Chronic anticoagulation 01/04/2023   Vaginal odor 06/29/2022   Generalized anxiety disorder 04/20/2022   Liver masses 04/13/2022   Prediabetes 04/12/2022   Thyroid nodule 04/12/2022   Blindness of both eyes 04/12/2022   Secondary adrenal insufficiency (HCC) 01/16/2022   DJD (degenerative joint disease) of knee 11/27/2021   Hyperlipidemia 11/23/2021   Beta thalassemia minor 03/07/2021   Postmenopause 10/21/2020   Lumbar nerve root impingement 10/04/2020   OSA (obstructive sleep apnea), Severe 10/04/2020   Left hemiparesis, Chronic (HCC) 05/26/2020   Cardiomyopathy, hypertrophic obstructive, mid cavitary (HCC) 03/02/2020   Microcytic anemia 02/26/2020   Physical deconditioning 02/16/2020   Specific antibody deficiency response to vaccination 01/18/2020   ILD (interstitial lung disease) (HCC) 08/31/2019   Constipation 08/04/2019   Steroid-induced osteopenia 03/23/2019   Healthcare maintenance 01/07/2019   SLE (systemic lupus erythematosus related syndrome) (HCC) 11/07/2018   Mild cognitive impairment 10/08/2018   Personal history of immunosupression therapy 10/08/2018   Long-term corticosteroid use 10/08/2018   Chronic central neuropathic pain 10/02/2018   Lupus anticoagulant disorder (HCC) 10/02/2018   Anxiety 07/12/2018   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   Essential hypertension 09/21/2015   Mood disorder (HCC) 09/21/2015   History of transient ischemic attack (TIA) 09/21/2015   Legal blindness 01/30/2014   Chronic GERD 03/20/2013    Past Surgical History:  Procedure Laterality Date   BREAST BIOPSY     BREAST CYST EXCISION Left pt unsure   COLONOSCOPY WITH PROPOFOL N/A 07/26/2017   Procedure: COLONOSCOPY WITH PROPOFOL;  Surgeon: Jeani Hawking, MD;  Location: [facility];  Service: Endoscopy;  Laterality: N/A;   ESOPHAGOGASTRODUODENOSCOPY (EGD) WITH  PROPOFOL N/A 07/26/2017   Procedure: ESOPHAGOGASTRODUODENOSCOPY (EGD) WITH PROPOFOL;  Surgeon: Jeani Hawking, MD;  Location: [facility];  Service: Endoscopy;  Laterality: N/A;   PORTA CATH INSERTION     TUBAL LIGATION     TUBAL LIGATION      OB History     Gravida  3   Para  3   Term  3   Preterm      AB      Living  3      SAB      IAB      Ectopic      Multiple      Live Births  3            Home Medications    Prior to Admission medications   Medication Sig Start Date End Date Taking? Authorizing Provider  alendronate (FOSAMAX) 70 MG tablet Take 1 tablet (70 mg total) by mouth every Friday. 12/14/22  Yes McDiarmid, Leighton Roach, MD  atorvastatin (LIPITOR) 40 MG tablet Take 40 mg by mouth daily. 01/19/22  Yes [provider]  diltiazem (CARDIZEM CD) 180 MG 24 hr capsule TAKE 1 CAPSULE BY MOUTH EVERY DAY Patient taking differently: Take 180 mg by mouth daily. 03/20/22  Yes McDiarmid, Leighton Roach, MD  doxycycline (VIBRAMYCIN) 100 MG capsule Take 1 capsule (100 mg total) by mouth  2 (two) times daily. 02/11/23  Yes Bing Neighbors, NP  DULoxetine (CYMBALTA) 60 MG capsule TAKE 1 CAPSULE BY MOUTH EVERY DAY Patient taking differently: Take 60 mg by mouth daily. 03/29/22  Yes McDiarmid, Leighton Roach, MD  esomeprazole (NEXIUM) 20 MG capsule TAKE 1 CAPSULE (20 MG TOTAL) BY MOUTH DAILY AT 12 NOON. 07/27/22  Yes McDiarmid, Leighton Roach, MD  gabapentin (NEURONTIN) 300 MG capsule Take 900 mg by mouth 3 (three) times daily. 09/21/20  Yes [provider]  LINZESS 145 MCG CAPS capsule TAKE ONE CAPSULE EVERY DAY ON EMPTY STOMACH AT LEAST BEFORE 1ST MEAL OF THE DAY Patient taking differently: Take 145 mcg by mouth daily before breakfast. 12/25/21  Yes McDiarmid, Leighton Roach, MD  LORazepam (ATIVAN) 0.5 MG tablet TAKE 1 TABLET BY MOUTH TWICE A DAY 08/10/22  Yes McDiarmid, Leighton Roach, MD  ondansetron (ZOFRAN) 4 MG tablet Take 1 tablet (4 mg total) by mouth every 8 (eight) hours as needed for  nausea or vomiting. 08/13/22  Yes Fayette Pho, MD  Oxcarbazepine (TRILEPTAL) 300 MG tablet Take 300 mg by mouth daily.   Yes [provider]  predniSONE (DELTASONE) 10 MG tablet TAKE 1/2 TABLET BY MOUTH DAILY 12/12/22  Yes McDiarmid, Leighton Roach, MD  promethazine (PHENERGAN) 25 MG tablet Take 1 tablet (25 mg total) by mouth every 6 (six) hours as needed for nausea or vomiting. 11/23/21  Yes McDiarmid, Leighton Roach, MD  promethazine-dextromethorphan (PROMETHAZINE-DM) 6.25-15 MG/5ML syrup Take 5 mLs by mouth 3 (three) times daily as needed for cough. 02/11/23  Yes Bing Neighbors, NP  rivaroxaban (XARELTO) 10 MG TABS tablet Take 1 tablet (10 mg total) by mouth daily. 08/10/22 08/05/23 Yes McDiarmid, Leighton Roach, MD  tiZANidine (ZANAFLEX) 4 MG tablet Take 8 mg by mouth at bedtime. 02/08/22  Yes [provider]  diclofenac Sodium (VOLTAREN) 1 % GEL Apply 1 Application topically daily as needed for pain. 12/10/21   [provider]  Polyethyl Glycol-Propyl Glycol (SYSTANE) 0.4-0.3 % SOLN Place 1 drop into both eyes daily as needed (dry eyes).    [provider]  RiTUXimab (RITUXAN IV) Inject 1,000 mg into the vein See admin instructions. 1,000 mg into the vein every 6 months, then two weeks later another 1,000 mg injection    [provider]    Family History Family History  Problem Relation Age of Onset   Cancer Mother    Cancer Father    Lupus Sister     Social History Social History   Tobacco Use   Smoking status: Never   Smokeless tobacco: Never  Vaping Use   Vaping Use: Never used  Substance Use Topics   Alcohol use: No   Drug use: No     Allergies   Beta adrenergic blockers and Rocephin [ceftriaxone]   Review of Systems Review of Systems Pertinent negatives listed in HPI   Physical Exam Triage Vital Signs ED Triage Vitals [02/11/23 1133]  Enc Vitals Group     BP (!) 140/83     Pulse Rate (!) 110     Resp 18     Temp 98.8 F (37.1 C)     Temp  Source Oral     SpO2 95 %     Weight 177 lb (80.3 kg)     Height 5\' 1"  (1.549 m)     Head Circumference      Peak Flow      Pain Score 0     Pain  Loc      Pain Edu?      Excl. in GC?    No data found.  Updated Vital Signs BP (!) 140/83 (BP Location: Left Arm)   Pulse (!) 110   Temp 98.8 F (37.1 C) (Oral)   Resp 18   Ht 5\' 1"  (1.549 m)   Wt 177 lb (80.3 kg)   SpO2 95%   BMI 33.44 kg/m   Visual Acuity Right Eye Distance:   Left Eye Distance:   Bilateral Distance:    Right Eye Near:   Left Eye Near:    Bilateral Near:     Physical Exam Vitals reviewed.  Constitutional:      Appearance: Normal appearance.  HENT:     Head: Normocephalic and atraumatic.     Right Ear: Tympanic membrane, ear canal and external ear normal.     Left Ear: Tympanic membrane, ear canal and external ear normal.     Mouth/Throat:     Mouth: Mucous membranes are moist.     Pharynx: Posterior oropharyngeal erythema present. No oropharyngeal exudate.  Eyes:     Extraocular Movements: Extraocular movements intact.     Pupils: Pupils are equal, round, and reactive to light.  Cardiovascular:     Rate and Rhythm: Regular rhythm. Tachycardia present.  Pulmonary:     Effort: Pulmonary effort is normal.     Breath sounds: Rhonchi present.  Musculoskeletal:        General: Normal range of motion.  Lymphadenopathy:     Cervical: Cervical adenopathy present.  Skin:    General: Skin is warm.     Capillary Refill: Capillary refill takes less than 2 seconds.  Neurological:     General: No focal deficit present.     Mental Status: She is alert.      UC Treatments / Results  Labs (all labs ordered are listed, but only abnormal results are displayed) Labs Reviewed - No data to display  EKG   Radiology No results found.  Procedures Procedures (including critical care time)  Medications Ordered in UC Medications - No data to display  Initial Impression / Assessment and Plan / UC  Course  I have reviewed the triage vital signs and the nursing notes.  Pertinent labs & imaging results that were available during my care of the patient were reviewed by me and considered in my medical decision making (see chart for details).    Based on exam findings patient likely has a sinus infection that initially started out as an acute upper respiratory illness.  Per discharge medication orders.  Treat well with fluids.  Fever develops may take Tylenol or ibuprofen.  Symptoms should improve gradually over the next several days.  If at any point your symptoms worsen or you have any difficulty breathing or chest tightness go immediately to the emergency department. Final Clinical Impressions(s) / UC Diagnoses   Final diagnoses:  Acute upper respiratory infection  Acute non-recurrent sinusitis, unspecified location     Discharge Instructions      Take all medication as prescribed.  If any of your symptoms worsen or you do not experience significant improvement after being on your antibiotics for total 5 days return for evaluation.  If you develop any difficulty breathing or chest tightness or rapid heart rate go immediately to the emergency department.  Continue to hydrate well with fluids and rest.     ED Prescriptions     Medication Sig Dispense Auth. Provider  doxycycline (VIBRAMYCIN) 100 MG capsule Take 1 capsule (100 mg total) by mouth 2 (two) times daily. 20 capsule Bing Neighbors, NP   promethazine-dextromethorphan (PROMETHAZINE-DM) 6.25-15 MG/5ML syrup Take 5 mLs by mouth 3 (three) times daily as needed for cough. 240 mL Bing Neighbors, NP      PDMP not reviewed this encounter.   Bing Neighbors, NP 02/12/23 [phone]

## 2023-02-20 ENCOUNTER — Other Ambulatory Visit: Payer: Self-pay | Admitting: Family Medicine

## 2023-02-20 DIAGNOSIS — E2749 Other adrenocortical insufficiency: Secondary | ICD-10-CM

## 2023-02-20 DIAGNOSIS — I422 Other hypertrophic cardiomyopathy: Secondary | ICD-10-CM

## 2023-03-04 ENCOUNTER — Other Ambulatory Visit: Payer: Self-pay | Admitting: Family Medicine

## 2023-03-04 DIAGNOSIS — F411 Generalized anxiety disorder: Secondary | ICD-10-CM

## 2023-03-04 DIAGNOSIS — G894 Chronic pain syndrome: Secondary | ICD-10-CM

## 2023-03-28 NOTE — Progress Notes (Cosign Needed Addendum)
Subjective:   Andrea Berry is a 57 y.o. female who presents for Medicare Annual (Subsequent) preventive examination.  Visit Complete: Virtual  I connected with  Geroge Baseman on 03/29/23 by a audio enabled telemedicine application and verified that I am speaking with the correct person using two identifiers.  Patient Location: Home  Provider Location: Home Office  I discussed the limitations of evaluation and management by telemedicine. The patient expressed understanding and agreed to proceed.  Review of Systems     Cardiac Risk Factors include: hypertension;sedentary lifestyle  Per patient no change in vitals since last visit, unable to obtain new vitals due to telehealth visit    Objective:    Today's Vitals   03/29/23 1406  Weight: 177 lb (80.3 kg)  Height: 5\' 1"  (1.549 m)   Body mass index is 33.44 kg/m.     03/29/2023    2:18 PM 01/03/2023    3:39 PM 08/31/2022    3:01 PM 08/27/2022    3:18 PM 06/28/2022    3:54 PM 04/19/2022    9:22 AM 11/23/2021    1:54 PM  Advanced Directives  Does Patient Have a Medical Advance Directive? Yes No No No No No No  Type of Estate agent of [place];Living will        Does patient want to make changes to medical advance directive? No - Patient declined        Copy of Healthcare Power of Attorney in Chart? Yes - validated most recent copy scanned in chart (See row information)        Would patient like information on creating a medical advance directive?  No - Patient declined No - Patient declined No - Patient declined No - Patient declined No - Patient declined No - Patient declined    Current Medications (verified) Outpatient Encounter Medications as of 03/29/2023  Medication Sig   alendronate (FOSAMAX) 70 MG tablet Take 1 tablet (70 mg total) by mouth every Friday.   atorvastatin (LIPITOR) 40 MG tablet Take 40 mg by mouth daily.   diclofenac Sodium (VOLTAREN) 1 % GEL Apply 1 Application  topically daily as needed for pain.   diltiazem (CARDIZEM CD) 180 MG 24 hr capsule TAKE 1 CAPSULE BY MOUTH EVERY DAY   doxycycline (VIBRAMYCIN) 100 MG capsule Take 1 capsule (100 mg total) by mouth 2 (two) times daily.   DULoxetine (CYMBALTA) 60 MG capsule TAKE 1 CAPSULE BY MOUTH EVERY DAY (Patient taking differently: Take 60 mg by mouth daily.)   esomeprazole (NEXIUM) 20 MG capsule TAKE 1 CAPSULE (20 MG TOTAL) BY MOUTH DAILY AT 12 NOON.   gabapentin (NEURONTIN) 300 MG capsule Take 900 mg by mouth 3 (three) times daily.   LINZESS 145 MCG CAPS capsule TAKE ONE CAPSULE EVERY DAY ON EMPTY STOMACH AT LEAST BEFORE 1ST MEAL OF THE DAY (Patient taking differently: Take 145 mcg by mouth daily before breakfast.)   LORazepam (ATIVAN) 0.5 MG tablet TAKE 1 TABLET BY MOUTH TWICE A DAY   ondansetron (ZOFRAN) 4 MG tablet Take 1 tablet (4 mg total) by mouth every 8 (eight) hours as needed for nausea or vomiting.   Oxcarbazepine (TRILEPTAL) 300 MG tablet Take 300 mg by mouth daily.   Polyethyl Glycol-Propyl Glycol (SYSTANE) 0.4-0.3 % SOLN Place 1 drop into both eyes daily as needed (dry eyes).   predniSONE (DELTASONE) 10 MG tablet TAKE 1/2 TABLET BY MOUTH DAILY   promethazine (PHENERGAN) 25 MG tablet Take 1 tablet (25  mg total) by mouth every 6 (six) hours as needed for nausea or vomiting.   promethazine-dextromethorphan (PROMETHAZINE-DM) 6.25-15 MG/5ML syrup Take 5 mLs by mouth 3 (three) times daily as needed for cough.   RiTUXimab (RITUXAN IV) Inject 1,000 mg into the vein See admin instructions. 1,000 mg into the vein every 6 months, then two weeks later another 1,000 mg injection   rivaroxaban (XARELTO) 10 MG TABS tablet Take 1 tablet (10 mg total) by mouth daily.   tiZANidine (ZANAFLEX) 4 MG tablet Take 8 mg by mouth at bedtime.   traMADol (ULTRAM) 50 MG tablet TAKE 1 TABLET BY MOUTH EVERY 6 HOURS AS NEEDED.   No facility-administered encounter medications on file as of 03/29/2023.    Allergies  (verified) Beta adrenergic blockers and Rocephin [ceftriaxone]   History: Past Medical History:  Diagnosis Date   Acute left hemiparesis on chronic left hemiparesis 04/12/2022   Acute respiratory distress syndrome (ARDS) due to COVID-19 virus (HCC) 03/27/2019   Acute respiratory failure with hypoxia (HCC) 02/16/2020   Anemia of chronic disease 10/28/2015   Anxiety 07/12/2018   Arthritis    Ataxia 01/03/2016   Blind    Chronic central neuropathic pain 10/02/2018   [facility] 09/2017   Chronic coughing 09/10/2016   Chronic pain syndrome 07/12/2018   Chronic respiratory failure with hypoxia (HCC)    Chronic rhinitis 01/07/2019   COVID-19 02/04/2020   Decreased strength 10/08/2018   Proximal leg muscles   Depression    Depression with anxiety 01/03/2016   Devic's disease (HCC)    Dysesthesia 01/03/2016   Dyspnea on exertion 11/07/2018   Elevated BP without diagnosis of hypertension 08/28/2019   Report of [facility] nurse in December with normal SBPs but DBPs in low 90s    Eustachian tube dysfunction 11/18/2018   L>R. Followed by [facility] ENT.   Eustachian tube dysfunction, bilateral 09/18/2018   Eustachian tube dysfunction, left 11/13/2018   L>R. Followed by [facility] ENT.   GERD (gastroesophageal reflux disease)    Greater trochanteric bursitis of left hip 04/27/2020   Formatting of this note might be different from the original. Added automatically from request for surgery [id number]  Formatting of this note might be different from the original. Added automatically from request for surgery [id number]   Headache(784.0)    History of chicken pox 10/08/2018   History of COVID-19 02/16/2020   History of CVA (cerebrovascular accident)    History of kidney stones    History of pulmonary embolism 12/10/2018   Lupus Antiphosholipid (+). Patient on secondary prophylaxis with rivaroxaban 20 mg daily long-term.     Hypertension    Hypertrophic cardiomyopathy (HCC) 03/02/2020    Hyponatremia 11/25/2015   Impairment of balance 10/08/2018   Legal blindness 01/30/2014   Long-term corticosteroid use 10/08/2018   Lupus (HCC)    Lupus anticoagulant disorder (HCC) 10/02/2018   Pulmonary Embolism 2017   Lupus cerebritis (HCC) 11/25/2015   Possible explanation of 11/25/2015 acute hyperintensity findings on Brain MRI.    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   Orthostatic dizziness, Recurrent 10/08/2018   Associated with medications   Otalgia 07/12/2018   Personal history of immunosupression therapy 10/08/2018   Chronic Azathioprine and prednisone therapy for Devic's disease   Pigmented skin lesion of uncertain nature 11/06/2018   Pneumonia 02/21/2020   Pneumonia of right lower lobe due to infectious organism 02/21/2020   Postmenopause 10/21/2020   Pulmonary embolism (HCC) 2017   Shortness of breath    due  to medication   Skin lesion of chest wall 11/07/2018   SLE (systemic lupus erythematosus related syndrome) (HCC) 11/07/2018   Smith antibody positive 04/07/2019   Stroke (HCC) 2008   visually impaired   Stroke (HCC) 04/13/2022   Syncope    Transaminasemia 10/21/2020   Ureteral calculi 2018   Vaginal odor 06/29/2022   Vitamin D deficiency 10/06/2018   Past Surgical History:  Procedure Laterality Date   BREAST BIOPSY     BREAST CYST EXCISION Left pt unsure   COLONOSCOPY WITH PROPOFOL N/A 07/26/2017   Procedure: COLONOSCOPY WITH PROPOFOL;  Surgeon: Jeani Hawking, MD;  Location: [facility];  Service: Endoscopy;  Laterality: N/A;   ESOPHAGOGASTRODUODENOSCOPY (EGD) WITH PROPOFOL N/A 07/26/2017   Procedure: ESOPHAGOGASTRODUODENOSCOPY (EGD) WITH PROPOFOL;  Surgeon: Jeani Hawking, MD;  Location: [facility];  Service: Endoscopy;  Laterality: N/A;   PORTA CATH INSERTION     TUBAL LIGATION     TUBAL LIGATION     Family History  Problem Relation Age of Onset   Cancer Mother    Cancer Father    Lupus Sister    Social History   Socioeconomic History    Marital status: Single    Spouse name: Not on file   Number of children: Not on file   Years of education: Not on file   Highest education level: Not on file  Occupational History   Occupation: disability  Tobacco Use   Smoking status: Never   Smokeless tobacco: Never  Vaping Use   Vaping status: Never Used  Substance and Sexual Activity   Alcohol use: No   Drug use: No   Sexual activity: Not Currently    Partners: Male    Birth control/protection: Post-menopausal, Surgical  Other Topics Concern   Not on file  Social History Narrative   Patient lives with son Salley Hews [phone]) as of 10/2018   Support from family   Dolores Lory the following services: Food benefits, Social Security Disabilty, Medicaid and Personal Care Services with  [facility] (10/2018).    Involved in her church and recives support from her church   Insufficient physical activity   Social Determinants of Health   Financial Resource Strain: Low Risk  (03/29/2023)   Overall Financial Resource Strain (CARDIA)    Difficulty of Paying Living Expenses: Not hard at all  Food Insecurity: No Food Insecurity (03/29/2023)   Hunger Vital Sign    Worried About Running Out of Food in the Last Year: Never true    Ran Out of Food in the Last Year: Never true  Transportation Needs: No Transportation Needs (03/29/2023)   PRAPARE - Administrator, Civil Service (Medical): No    Lack of Transportation (Non-Medical): No  Physical Activity: Insufficiently Active (03/29/2023)   Exercise Vital Sign    Days of Exercise per Week: 3 days    Minutes of Exercise per Session: 20 min  Stress: No Stress Concern Present (03/29/2023)   [employer] of Occupational Health - Occupational Stress Questionnaire    Feeling of Stress : Not at all  Social Connections: Moderately Isolated (03/29/2023)   Social Connection and Isolation Panel [NHANES]    Frequency of Communication with Friends and Family: More  than three times a week    Frequency of Social Gatherings with Friends and Family: Three times a week    Attends Religious Services: More than 4 times per year    Active Member of Clubs or Organizations: No    Attends  Banker Meetings: Never    Marital Status: Never married    Tobacco Counseling Counseling given: Not Answered   Clinical Intake:  Pre-visit preparation completed: Yes  Pain : No/denies pain     Diabetes: No  How often do you need to have someone help you when you read instructions, pamphlets, or other written materials from your doctor or pharmacy?: 1 - Never  Interpreter Needed?: No  Information entered by :: Kandis Fantasia LPN   Activities of Daily Living    03/29/2023    2:16 PM  In your present state of health, do you have any difficulty performing the following activities:  Hearing? 0  Vision? 1  Difficulty concentrating or making decisions? 0  Walking or climbing stairs? 1  Dressing or bathing? 0  Doing errands, shopping? 1  Preparing Food and eating ? N  Using the Toilet? N  In the past six months, have you accidently leaked urine? N  Do you have problems with loss of bowel control? N  Managing your Medications? N  Managing your Finances? Y  Housekeeping or managing your Housekeeping? Y    Patient Care Team: McDiarmid, Leighton Roach, MD as PCP - General (Family Medicine) Harlen Labs, MD as Consulting Physician (Neurology) Aquilla Hacker, PA-C as Physician Assistant (Physician Assistant) Humberto Leep, MD as Consulting Physician (Rheumatology) Rinaldo Cloud, MD as Consulting Physician (Cardiology)  Indicate any recent Medical Services you may have received from other than [facility] providers in the past year (date may be approximate).     Assessment:   This is a routine wellness examination for [place].  Hearing/Vision screen Hearing Screening - Comments:: Denies hearing difficulties   Vision Screening - Comments::  up to date  with routine eye exams   Dietary issues and exercise activities discussed:     Goals Addressed               This Visit's Progress     COMPLETED: Acknowledge receipt of Advanced Directive paperwork        Current Barriers:  Has not been able to get document notarized Clinical Social Work Clinical Goal(s):  Over the next 45 days, patient will have Advance Directive notarized and provide a copy to [facility] to be scanned into the chart.  Interventions: Explained to patient that she could bring Advance Directives and have them notarized at [facility].   Patient Self Care Activities:  Can identify next of kin, power or attorney and has completed document. Initial goal documentation        COMPLETED: Manage symptoms of depression (pt-stated)        Current Barriers:  Unmet counseling needs for patient with anxiety, mood disorder and Chronic rhinitis Patient has decided she no longer wants to connect for ongoing counseling has resources and information if she changes her mind Clinical Goal(s)  Over the next 30 days, patient will continue to implement interventions of exercising 3 times per week and cross word puzzles Interventions :  Assessed patient's ongoing needs  Assessed barriers to why patient did not keep previous virtual counseling appointment Discussed benefits of interventions to manage symptom of depression   (self-care action plan) and well as interventions to assist with sleep hygiene. Copies of work Teacher, early years/pre and information mailed to patient. Patient Self Care Activities:  Call provider office for new concerns or questions Motivated to continue with self care Please see past updates related to this goal by clicking on the "Past Updates" button in the  selected goal        Remain active         Depression Screen    03/29/2023    2:11 PM 08/31/2022    3:01 PM 08/27/2022    3:17 PM 08/16/2022    4:24 PM 08/13/2022    4:33 PM 06/28/2022    3:54 PM  04/19/2022    9:21 AM  PHQ 2/9 Scores  PHQ - 2 Score 0  1  4 1 2   PHQ- 9 Score   4  15 2 7   Exception Documentation  Patient refusal  Patient refusal       Fall Risk    03/29/2023    2:15 PM 05/26/2020    2:39 PM 07/23/2019   11:23 AM 03/27/2019   11:08 AM 07/16/2018   12:21 PM  Fall Risk   Falls in the past year? 0 0 1 0 0  Number falls in past yr: 0 0 0 0   Injury with Fall? 0  0    Risk for fall due to : Impaired mobility Impaired vision   Impaired vision;Medication side effect;Impaired mobility  Follow up Falls prevention discussed;Education provided;Falls evaluation completed        MEDICARE RISK AT HOME:  Medicare Risk at Home - 03/29/23 1415     Any stairs in or around the home? No    If so, are there any without handrails? No    Home free of loose throw rugs in walkways, pet beds, electrical cords, etc? Yes    Adequate lighting in your home to reduce risk of falls? Yes    Life alert? No    Use of a cane, walker or w/c? Yes    Grab bars in the bathroom? Yes    Shower chair or bench in shower? Yes    Elevated toilet seat or a handicapped toilet? Yes             TIMED UP AND GO:  Was the test performed?  No    Cognitive Function:        03/29/2023    2:18 PM 07/16/2018   12:28 PM  6CIT Screen  What Year? 0 points 0 points  What month? 0 points 0 points  What time? 0 points 0 points  Count back from 20 0 points 0 points  Months in reverse 0 points 0 points  Repeat phrase 0 points 0 points  Total Score 0 points 0 points    Immunizations Immunization History  Administered Date(s) Administered   Influenza,inj,Quad PF,6+ Mos 09/24/2015, 07/16/2018, 07/23/2019, 05/26/2020, 06/28/2022   Influenza-Unspecified 07/10/2017   PFIZER Comirnaty(Gray Top)Covid-19 Tri-Sucrose Vaccine 03/16/2021, 04/21/2021, 10/23/2022   PFIZER(Purple Top)SARS-COV-2 Vaccination 12/24/2019, 01/13/2020, 06/28/2020   Pneumococcal Conjugate-13 10/19/2019   Pneumococcal  Polysaccharide-23 07/23/2019   Tdap 07/16/2018, 04/03/2019   Zoster Recombinant(Shingrix) 10/13/2018, 04/03/2019    TDAP status: Up to date  Pneumococcal vaccine status: Up to date  Covid-19 vaccine status: Information provided on how to obtain vaccines.   Qualifies for Shingles Vaccine? Yes   Zostavax completed No   Shingrix Completed?: Yes  Screening Tests Health Maintenance  Topic Date Due   COVID-19 Vaccine (6 - 2023-24 season) 12/18/2022   INFLUENZA VACCINE  04/11/2023   Medicare Annual Wellness (AWV)  03/28/2024   MAMMOGRAM  04/09/2024   PAP SMEAR-Modifier  01/31/2025   DTaP/Tdap/Td (3 - Td or Tdap) 04/02/2029   Colonoscopy  11/28/2032   Hepatitis C Screening  Completed   HIV Screening  Completed   Zoster Vaccines- Shingrix  Completed   HPV VACCINES  Aged Out    Health Maintenance  Health Maintenance Due  Topic Date Due   COVID-19 Vaccine (6 - 2023-24 season) 12/18/2022    Colorectal cancer screening: Type of screening: Colonoscopy. Completed 11/29/22. Repeat every 10 years  Mammogram status: Completed 04/09/22. Repeat every year  Lung Cancer Screening: (Low Dose CT Chest recommended if Age 46-80 years, 20 pack-year currently smoking OR have quit w/in 15years.) does not qualify.   Lung Cancer Screening Referral: n/a  Additional Screening:  Hepatitis C Screening: does qualify; Completed 03/01/20  Vision Screening: Recommended annual ophthalmology exams for early detection of glaucoma and other disorders of the eye. Is the patient up to date with their annual eye exam?  Yes  Who is the provider or what is the name of the office in which the patient attends annual eye exams? Provider with [facility]  If pt is not established with a provider, would they like to be referred to a provider to establish care? No .   Dental Screening: Recommended annual dental exams for proper oral hygiene  Community Resource Referral / Chronic Care Management: CRR required this  visit?  No   CCM required this visit?  No     Plan:     I have personally reviewed and noted the following in the patient's chart:   Medical and social history Use of alcohol, tobacco or illicit drugs  Current medications and supplements including opioid prescriptions. Patient is currently taking opioid prescriptions. Information provided to patient regarding non-opioid alternatives. Patient advised to discuss non-opioid treatment plan with their provider. Functional ability and status Nutritional status Physical activity Advanced directives List of other physicians Hospitalizations, surgeries, and ER visits in previous 12 months Vitals Screenings to include cognitive, depression, and falls Referrals and appointments  In addition, I have reviewed and discussed with patient certain preventive protocols, quality metrics, and best practice recommendations. A written personalized care plan for preventive services as well as general preventive health recommendations were provided to patient.     Kandis Fantasia [place], [place]   1/61/0960   After Visit Summary: (Mail) Due to this being a telephonic visit, the after visit summary with patients personalized plan was offered to patient via mail   Nurse Notes: No concerns at this time

## 2023-03-29 ENCOUNTER — Ambulatory Visit (INDEPENDENT_AMBULATORY_CARE_PROVIDER_SITE_OTHER): Payer: Medicare Other

## 2023-03-29 VITALS — Ht 61.0 in | Wt 177.0 lb

## 2023-03-29 DIAGNOSIS — Z1231 Encounter for screening mammogram for malignant neoplasm of breast: Secondary | ICD-10-CM

## 2023-03-29 DIAGNOSIS — Z0000 Encounter for general adult medical examination without abnormal findings: Secondary | ICD-10-CM | POA: Diagnosis not present

## 2023-03-29 NOTE — Patient Instructions (Signed)
Andrea Berry , Thank you for taking time to come for your Medicare Wellness Visit. I appreciate your ongoing commitment to your health goals. Please review the following plan we discussed and let me know if I can assist you in the future.   These are the goals we discussed:  Goals      Exercise 150 min/wk Moderate Activity     Remain active        This is a list of the screening recommended for you and due dates:  Health Maintenance  Topic Date Due   COVID-19 Vaccine (6 - 2023-24 season) 12/18/2022   Flu Shot  04/11/2023   Medicare Annual Wellness Visit  03/28/2024   Mammogram  04/09/2024   Pap Smear  01/31/2025   DTaP/Tdap/Td vaccine (3 - Td or Tdap) 04/02/2029   Colon Cancer Screening  11/28/2032   Hepatitis C Screening  Completed   HIV Screening  Completed   Zoster (Shingles) Vaccine  Completed   HPV Vaccine  Aged Out    Advanced directives: We have a copy of your advanced directives available in your record should your provider ever need to access them.  Conditions/risks identified: Aim for 30 minutes of exercise or brisk walking, 6-8 glasses of water, and 5 servings of fruits and vegetables each day.  Next appointment: Follow up in one year for your annual wellness visit.   Preventive Care 40-64 Years, Female Preventive care refers to lifestyle choices and visits with your health care provider that can promote health and wellness. What does preventive care include? A yearly physical exam. This is also called an annual well check. Dental exams once or twice a year. Routine eye exams. Ask your health care provider how often you should have your eyes checked. Personal lifestyle choices, including: Daily care of your teeth and gums. Regular physical activity. Eating a healthy diet. Avoiding tobacco and drug use. Limiting alcohol use. Practicing safe sex. Taking low-dose aspirin daily starting at age 54. Taking vitamin and mineral supplements as recommended by your health  care provider. What happens during an annual well check? The services and screenings done by your health care provider during your annual well check will depend on your age, overall health, lifestyle risk factors, and family history of disease. Counseling  Your health care provider may ask you questions about your: Alcohol use. Tobacco use. Drug use. Emotional well-being. Home and relationship well-being. Sexual activity. Eating habits. Work and work Astronomer. Method of birth control. Menstrual cycle. Pregnancy history. Screening  You may have the following tests or measurements: Height, weight, and BMI. Blood pressure. Lipid and cholesterol levels. These may be checked every 5 years, or more frequently if you are over 53 years old. Skin check. Lung cancer screening. You may have this screening every year starting at age 47 if you have a 30-pack-year history of smoking and currently smoke or have quit within the past 15 years. Fecal occult blood test (FOBT) of the stool. You may have this test every year starting at age 69. Flexible sigmoidoscopy or colonoscopy. You may have a sigmoidoscopy every 5 years or a colonoscopy every 10 years starting at age 5. Hepatitis C blood test. Hepatitis B blood test. Sexually transmitted disease (STD) testing. Diabetes screening. This is done by checking your blood sugar (glucose) after you have not eaten for a while (fasting). You may have this done every 1-3 years. Mammogram. This may be done every 1-2 years. Talk to your health care provider about when  you should start having regular mammograms. This may depend on whether you have a family history of breast cancer. BRCA-related cancer screening. This may be done if you have a family history of breast, ovarian, tubal, or peritoneal cancers. Pelvic exam and Pap test. This may be done every 3 years starting at age 43. Starting at age 64, this may be done every 5 years if you have a Pap test in  combination with an HPV test. Bone density scan. This is done to screen for osteoporosis. You may have this scan if you are at high risk for osteoporosis. Discuss your test results, treatment options, and if necessary, the need for more tests with your health care provider. Vaccines  Your health care provider may recommend certain vaccines, such as: Influenza vaccine. This is recommended every year. Tetanus, diphtheria, and acellular pertussis (Tdap, Td) vaccine. You may need a Td booster every 10 years. Zoster vaccine. You may need this after age 31. Pneumococcal 13-valent conjugate (PCV13) vaccine. You may need this if you have certain conditions and were not previously vaccinated. Pneumococcal polysaccharide (PPSV23) vaccine. You may need one or two doses if you smoke cigarettes or if you have certain conditions. Talk to your health care provider about which screenings and vaccines you need and how often you need them. This information is not intended to replace advice given to you by your health care provider. Make sure you discuss any questions you have with your health care provider. Document Released: 09/23/2015 Document Revised: 05/16/2016 Document Reviewed: 06/28/2015 Elsevier Interactive Patient Education  2017 [employer].    Fall Prevention in the Home Falls can cause injuries. They can happen to people of all ages. There are many things you can do to make your home safe and to help prevent falls. What can I do on the outside of my home? Regularly fix the edges of walkways and driveways and fix any cracks. Remove anything that might make you trip as you walk through a door, such as a raised step or threshold. Trim any bushes or trees on the path to your home. Use bright outdoor lighting. Clear any walking paths of anything that might make someone trip, such as rocks or tools. Regularly check to see if handrails are loose or broken. Make sure that both sides of any steps have  handrails. Any raised decks and porches should have guardrails on the edges. Have any leaves, snow, or ice cleared regularly. Use sand or salt on walking paths during winter. Clean up any spills in your garage right away. This includes oil or grease spills. What can I do in the bathroom? Use night lights. Install grab bars by the toilet and in the tub and shower. Do not use towel bars as grab bars. Use non-skid mats or decals in the tub or shower. If you need to sit down in the shower, use a plastic, non-slip stool. Keep the floor dry. Clean up any water that spills on the floor as soon as it happens. Remove soap buildup in the tub or shower regularly. Attach bath mats securely with double-sided non-slip rug tape. Do not have throw rugs and other things on the floor that can make you trip. What can I do in the bedroom? Use night lights. Make sure that you have a light by your bed that is easy to reach. Do not use any sheets or blankets that are too big for your bed. They should not hang down onto the floor. Have a  firm chair that has side arms. You can use this for support while you get dressed. Do not have throw rugs and other things on the floor that can make you trip. What can I do in the kitchen? Clean up any spills right away. Avoid walking on wet floors. Keep items that you use a lot in easy-to-reach places. If you need to reach something above you, use a strong step stool that has a grab bar. Keep electrical cords out of the way. Do not use floor polish or wax that makes floors slippery. If you must use wax, use non-skid floor wax. Do not have throw rugs and other things on the floor that can make you trip. What can I do with my stairs? Do not leave any items on the stairs. Make sure that there are handrails on both sides of the stairs and use them. Fix handrails that are broken or loose. Make sure that handrails are as long as the stairways. Check any carpeting to make sure  that it is firmly attached to the stairs. Fix any carpet that is loose or worn. Avoid having throw rugs at the top or bottom of the stairs. If you do have throw rugs, attach them to the floor with carpet tape. Make sure that you have a light switch at the top of the stairs and the bottom of the stairs. If you do not have them, ask someone to add them for you. What else can I do to help prevent falls? Wear shoes that: Do not have high heels. Have rubber bottoms. Are comfortable and fit you well. Are closed at the toe. Do not wear sandals. If you use a stepladder: Make sure that it is fully opened. Do not climb a closed stepladder. Make sure that both sides of the stepladder are locked into place. Ask someone to hold it for you, if possible. Clearly mark and make sure that you can see: Any grab bars or handrails. First and last steps. Where the edge of each step is. Use tools that help you move around (mobility aids) if they are needed. These include: Canes. Walkers. Scooters. Crutches. Turn on the lights when you go into a dark area. Replace any light bulbs as soon as they burn out. Set up your furniture so you have a clear path. Avoid moving your furniture around. If any of your floors are uneven, fix them. If there are any pets around you, be aware of where they are. Review your medicines with your doctor. Some medicines can make you feel dizzy. This can increase your chance of falling. Ask your doctor what other things that you can do to help prevent falls. This information is not intended to replace advice given to you by your health care provider. Make sure you discuss any questions you have with your health care provider. Document Released: 06/23/2009 Document Revised: 02/02/2016 Document Reviewed: 10/01/2014 Elsevier Interactive Patient Education  2017 [employer].

## 2023-04-07 ENCOUNTER — Observation Stay (HOSPITAL_COMMUNITY)
Admission: EM | Admit: 2023-04-07 | Discharge: 2023-04-08 | Disposition: A | Discharge status: Home / Self Care | Payer: Medicare Other | Attending: Family Medicine | Admitting: Family Medicine

## 2023-04-07 ENCOUNTER — Other Ambulatory Visit: Payer: Self-pay

## 2023-04-07 ENCOUNTER — Emergency Department (HOSPITAL_COMMUNITY): Payer: Medicare Other

## 2023-04-07 ENCOUNTER — Encounter (HOSPITAL_COMMUNITY): Payer: Self-pay

## 2023-04-07 DIAGNOSIS — J189 Pneumonia, unspecified organism: Secondary | ICD-10-CM

## 2023-04-07 DIAGNOSIS — Z86711 Personal history of pulmonary embolism: Secondary | ICD-10-CM | POA: Diagnosis not present

## 2023-04-07 DIAGNOSIS — D508 Other iron deficiency anemias: Secondary | ICD-10-CM | POA: Insufficient documentation

## 2023-04-07 DIAGNOSIS — Z1152 Encounter for screening for COVID-19: Secondary | ICD-10-CM | POA: Insufficient documentation

## 2023-04-07 DIAGNOSIS — E2749 Other adrenocortical insufficiency: Secondary | ICD-10-CM

## 2023-04-07 DIAGNOSIS — Z8673 Personal history of transient ischemic attack (TIA), and cerebral infarction without residual deficits: Secondary | ICD-10-CM | POA: Diagnosis not present

## 2023-04-07 DIAGNOSIS — M6281 Muscle weakness (generalized): Secondary | ICD-10-CM | POA: Diagnosis not present

## 2023-04-07 DIAGNOSIS — Z79899 Other long term (current) drug therapy: Secondary | ICD-10-CM | POA: Diagnosis not present

## 2023-04-07 DIAGNOSIS — G8929 Other chronic pain: Secondary | ICD-10-CM | POA: Diagnosis not present

## 2023-04-07 DIAGNOSIS — I10 Essential (primary) hypertension: Secondary | ICD-10-CM | POA: Diagnosis not present

## 2023-04-07 DIAGNOSIS — Z7901 Long term (current) use of anticoagulants: Secondary | ICD-10-CM | POA: Diagnosis not present

## 2023-04-07 DIAGNOSIS — R519 Headache, unspecified: Secondary | ICD-10-CM | POA: Diagnosis present

## 2023-04-07 HISTORY — DX: Pneumonia, unspecified organism: J18.9

## 2023-04-07 LAB — COMPREHENSIVE METABOLIC PANEL
ALT: 15 U/L (ref 0–44)
AST: 20 U/L (ref 15–41)
Albumin: 3.6 g/dL (ref 3.5–5.0)
Alkaline Phosphatase: 73 U/L (ref 38–126)
Anion gap: 12 (ref 5–15)
BUN: 5 mg/dL — ABNORMAL LOW (ref 6–20)
CO2: 23 mmol/L (ref 22–32)
Calcium: 9.4 mg/dL (ref 8.9–10.3)
Chloride: 102 mmol/L (ref 98–111)
Creatinine, Ser: 0.95 mg/dL (ref 0.44–1.00)
GFR, Estimated: >60 mL/min (ref >60)
Glucose, Bld: 85 mg/dL (ref 70–99)
Potassium: 3.4 mmol/L — ABNORMAL LOW (ref 3.5–5.1)
Sodium: 137 mmol/L (ref 135–145)
Total Bilirubin: 0.5 mg/dL (ref 0.3–1.2)
Total Protein: 7.2 g/dL (ref 6.5–8.1)

## 2023-04-07 LAB — CBC WITH DIFFERENTIAL/PLATELET
Abs Immature Granulocytes: 0.07 10*3/uL (ref 0.00–0.07)
Basophils Absolute: 0 10*3/uL (ref 0.0–0.1)
Basophils Relative: 0 %
Eosinophils Absolute: 0.1 10*3/uL (ref 0.0–0.5)
Eosinophils Relative: 1 %
HCT: 41.6 % (ref 36.0–46.0)
Hemoglobin: 12.1 g/dL (ref 12.0–15.0)
Immature Granulocytes: 1 %
Lymphocytes Relative: 16 %
Lymphs Abs: 1.1 10*3/uL (ref 0.7–4.0)
MCH: 21.6 pg — ABNORMAL LOW (ref 26.0–34.0)
MCHC: 29.1 g/dL — ABNORMAL LOW (ref 30.0–36.0)
MCV: 74.3 fL — ABNORMAL LOW (ref 80.0–100.0)
Monocytes Absolute: 0.6 10*3/uL (ref 0.1–1.0)
Monocytes Relative: 8 %
Neutro Abs: 5 10*3/uL (ref 1.7–7.7)
Neutrophils Relative %: 74 %
Platelets: 404 10*3/uL — ABNORMAL HIGH (ref 150–400)
RBC: 5.6 MIL/uL — ABNORMAL HIGH (ref 3.87–5.11)
RDW: 15.8 % — ABNORMAL HIGH (ref 11.5–15.5)
WBC: 6.8 10*3/uL (ref 4.0–10.5)
nRBC: 0 % (ref 0.0–0.2)

## 2023-04-07 LAB — URINALYSIS, ROUTINE W REFLEX MICROSCOPIC
Bilirubin Urine: NEGATIVE
Glucose, UA: NEGATIVE mg/dL
Hgb urine dipstick: NEGATIVE
Ketones, ur: NEGATIVE mg/dL
Leukocytes,Ua: NEGATIVE
Nitrite: NEGATIVE
Protein, ur: NEGATIVE mg/dL
Specific Gravity, Urine: 1.017 (ref 1.005–1.030)
pH: 7 (ref 5.0–8.0)

## 2023-04-07 LAB — I-STAT CG4 LACTIC ACID, ED
Lactic Acid, Venous: 2.6 mmol/L (ref 0.5–1.9)
Lactic Acid, Venous: 1.6 mmol/L (ref 0.5–1.9)

## 2023-04-07 LAB — RESP PANEL BY RT-PCR (RSV, FLU A&B, COVID)  RVPGX2
Influenza A by PCR: NEGATIVE
Influenza B by PCR: NEGATIVE
Resp Syncytial Virus by PCR: NEGATIVE
SARS Coronavirus 2 by RT PCR: NEGATIVE

## 2023-04-07 MED ORDER — ACETAMINOPHEN 325 MG PO TABS
650.0000 mg | ORAL_TABLET | Freq: Four times a day (QID) | ORAL | Status: DC | PRN
  Administered 2023-04-07: 650 mg via ORAL
  Filled 2023-04-07: qty 2

## 2023-04-07 MED ORDER — LACTATED RINGERS IV BOLUS
1000.0000 mL | Freq: Once | INTRAVENOUS | Status: AC
  Administered 2023-04-07: 1000 mL via INTRAVENOUS

## 2023-04-07 MED ORDER — DOXYCYCLINE HYCLATE 100 MG PO TABS
100.0000 mg | ORAL_TABLET | Freq: Two times a day (BID) | ORAL | Status: DC
  Administered 2023-04-07 – 2023-04-08 (×3): 100 mg via ORAL
  Filled 2023-04-07 (×3): qty 1

## 2023-04-07 MED ORDER — PANTOPRAZOLE SODIUM 40 MG PO TBEC
40.0000 mg | DELAYED_RELEASE_TABLET | Freq: Every day | ORAL | Status: DC
  Administered 2023-04-07 – 2023-04-08 (×2): 40 mg via ORAL
  Filled 2023-04-07 (×2): qty 1

## 2023-04-07 MED ORDER — DIPHENHYDRAMINE HCL 50 MG/ML IJ SOLN: 50.0000 mg | Freq: Once | INTRAMUSCULAR | Status: DC | PRN

## 2023-04-07 MED ORDER — RIVAROXABAN 10 MG PO TABS
10.0000 mg | ORAL_TABLET | Freq: Every day | ORAL | Status: DC
  Administered 2023-04-07 – 2023-04-08 (×2): 10 mg via ORAL
  Filled 2023-04-07 (×2): qty 1

## 2023-04-07 MED ORDER — GABAPENTIN 300 MG PO CAPS
900.0000 mg | ORAL_CAPSULE | Freq: Two times a day (BID) | ORAL | Status: DC
  Administered 2023-04-07 – 2023-04-08 (×2): 900 mg via ORAL
  Filled 2023-04-07 (×2): qty 3

## 2023-04-07 MED ORDER — METHYLPREDNISOLONE SODIUM SUCC 125 MG IJ SOLR: 125.0000 mg | Freq: Once | INTRAMUSCULAR | Status: DC | PRN

## 2023-04-07 MED ORDER — LEVOFLOXACIN IN D5W 750 MG/150ML IV SOLN
750.0000 mg | Freq: Once | INTRAVENOUS | Status: DC
  Administered 2023-04-07: 750 mg via INTRAVENOUS
  Filled 2023-04-07: qty 150

## 2023-04-07 MED ORDER — ACETAMINOPHEN 325 MG PO TABS
650.0000 mg | ORAL_TABLET | Freq: Once | ORAL | Status: AC | PRN
  Administered 2023-04-07: 650 mg via ORAL
  Filled 2023-04-07: qty 2

## 2023-04-07 MED ORDER — ALBUTEROL SULFATE HFA 108 (90 BASE) MCG/ACT IN AERS: 2.0000 | INHALATION_SPRAY | Freq: Once | RESPIRATORY_TRACT | Status: DC | PRN

## 2023-04-07 MED ORDER — PREDNISONE 20 MG PO TABS
40.0000 mg | ORAL_TABLET | Freq: Every day | ORAL | Status: DC
  Administered 2023-04-07 – 2023-04-08 (×2): 40 mg via ORAL
  Filled 2023-04-07 (×2): qty 2

## 2023-04-07 MED ORDER — POTASSIUM CHLORIDE CRYS ER 20 MEQ PO TBCR
40.0000 meq | EXTENDED_RELEASE_TABLET | Freq: Once | ORAL | Status: AC
  Administered 2023-04-07: 40 meq via ORAL
  Filled 2023-04-07: qty 2

## 2023-04-07 MED ORDER — ONDANSETRON 4 MG PO TBDP
8.0000 mg | ORAL_TABLET | Freq: Once | ORAL | Status: AC
  Administered 2023-04-07: 8 mg via ORAL
  Filled 2023-04-07: qty 2

## 2023-04-07 MED ORDER — DULOXETINE HCL 30 MG PO CPEP
60.0000 mg | ORAL_CAPSULE | Freq: Every day | ORAL | Status: DC
  Administered 2023-04-07 – 2023-04-08 (×2): 60 mg via ORAL
  Filled 2023-04-07 (×2): qty 2

## 2023-04-07 MED ORDER — SODIUM CHLORIDE 0.9 % IV SOLN: INTRAVENOUS | Status: DC | PRN

## 2023-04-07 MED ORDER — ACETAMINOPHEN 650 MG RE SUPP: 650.0000 mg | Freq: Four times a day (QID) | RECTAL | Status: DC | PRN

## 2023-04-07 MED ORDER — ATORVASTATIN CALCIUM 40 MG PO TABS
40.0000 mg | ORAL_TABLET | Freq: Every day | ORAL | Status: DC
  Administered 2023-04-07 – 2023-04-08 (×2): 40 mg via ORAL
  Filled 2023-04-07 (×2): qty 1

## 2023-04-07 MED ORDER — SODIUM CHLORIDE 0.9 % IV SOLN
3.0000 g | Freq: Four times a day (QID) | INTRAVENOUS | Status: DC
  Administered 2023-04-07 – 2023-04-08 (×4): 3 g via INTRAVENOUS
  Filled 2023-04-07 (×4): qty 8

## 2023-04-07 MED ORDER — FAMOTIDINE IN NACL 20-0.9 MG/50ML-% IV SOLN: 20.0000 mg | Freq: Once | INTRAVENOUS | Status: DC | PRN

## 2023-04-07 MED ORDER — EPINEPHRINE 0.3 MG/0.3ML IJ SOAJ: 0.3000 mg | Freq: Once | INTRAMUSCULAR | Status: DC | PRN

## 2023-04-07 MED ORDER — SODIUM CHLORIDE 0.9 % IV SOLN: 100.0000 mg | Freq: Once | INTRAVENOUS | Status: DC

## 2023-04-07 MED ORDER — DILTIAZEM HCL ER COATED BEADS 180 MG PO CP24
180.0000 mg | ORAL_CAPSULE | Freq: Every day | ORAL | Status: DC
  Administered 2023-04-07 – 2023-04-08 (×2): 180 mg via ORAL
  Filled 2023-04-07 (×2): qty 1

## 2023-04-07 NOTE — Assessment & Plan Note (Signed)
With several days of headache, fever, nasal congestion, and cough patient presented to the ED with bibasilar crackles on exam and chest x-ray revealed patchy bilateral lower lung opacities suspicious for bilateral lung base pneumonia. Lactic acid elevated to 2.6 but trended down to 1.6. COVID and flu were negative. Due to her allergy to Rocephin, patient was started on Levaquin in the ED. - Admit to FMTS, attending Dr. Manson Passey - Med-Surg, Vital signs per floor - Regular diet  - PT/OT to treat - VTE prophylaxis: Xarelto, confirmed no missed doses - S/P Levaquin from ED discontinued 7/28, - Unasyn 1.5g and Doxycycline 100 mg BID (7/28- ) x 5 days - Likely transition Unasyn to Augmentin - Increasing home Prednisone 10 mg to 40 mg for 5 days (Day 1/5) - AM CBC/BMP

## 2023-04-07 NOTE — ED Provider Notes (Signed)
[place] EMERGENCY DEPARTMENT AT [facility] Provider Note   CSN: [id number] Arrival date & time: 04/07/23  9528     History  Chief Complaint  Patient presents with   Headache    Ronie Raczkowski is a 57 y.o. female.  HPI 57 year old female history of blindness, hypertension, TIA, SLE, social lung disease, anemia, presents today with 3 days of headache, nasal congestion, fever, chills.  No known sick contacts.  She reports taking her medications as prescribed.    Home Medications Prior to Admission medications   Medication Sig Start Date End Date Taking? Authorizing Provider  alendronate (FOSAMAX) 70 MG tablet Take 1 tablet (70 mg total) by mouth every Friday. 12/14/22   McDiarmid, Leighton Roach, MD  atorvastatin (LIPITOR) 40 MG tablet Take 40 mg by mouth daily. 01/19/22   [provider]  diclofenac Sodium (VOLTAREN) 1 % GEL Apply 1 Application topically daily as needed for pain. 12/10/21   [provider]  diltiazem (CARDIZEM CD) 180 MG 24 hr capsule TAKE 1 CAPSULE BY MOUTH EVERY DAY 02/21/23   McDiarmid, Leighton Roach, MD  doxycycline (VIBRAMYCIN) 100 MG capsule Take 1 capsule (100 mg total) by mouth 2 (two) times daily. 02/11/23   Bing Neighbors, NP  DULoxetine (CYMBALTA) 60 MG capsule TAKE 1 CAPSULE BY MOUTH EVERY DAY Patient taking differently: Take 60 mg by mouth daily. 03/29/22   McDiarmid, Leighton Roach, MD  esomeprazole (NEXIUM) 20 MG capsule TAKE 1 CAPSULE (20 MG TOTAL) BY MOUTH DAILY AT 12 NOON. 07/27/22   McDiarmid, Leighton Roach, MD  gabapentin (NEURONTIN) 300 MG capsule Take 900 mg by mouth 3 (three) times daily. 09/21/20   [provider]  LINZESS 145 MCG CAPS capsule TAKE ONE CAPSULE EVERY DAY ON EMPTY STOMACH AT LEAST BEFORE 1ST MEAL OF THE DAY Patient taking differently: Take 145 mcg by mouth daily before breakfast. 12/25/21   McDiarmid, Leighton Roach, MD  LORazepam (ATIVAN) 0.5 MG tablet TAKE 1 TABLET BY MOUTH TWICE A DAY 03/05/23   McDiarmid, Leighton Roach, MD   ondansetron (ZOFRAN) 4 MG tablet Take 1 tablet (4 mg total) by mouth every 8 (eight) hours as needed for nausea or vomiting. 08/13/22   Valetta Close, MD  Oxcarbazepine (TRILEPTAL) 300 MG tablet Take 300 mg by mouth daily.    [provider]  Polyethyl Glycol-Propyl Glycol (SYSTANE) 0.4-0.3 % SOLN Place 1 drop into both eyes daily as needed (dry eyes).    [provider]  predniSONE (DELTASONE) 10 MG tablet TAKE 1/2 TABLET BY MOUTH DAILY 12/12/22   McDiarmid, Leighton Roach, MD  promethazine (PHENERGAN) 25 MG tablet Take 1 tablet (25 mg total) by mouth every 6 (six) hours as needed for nausea or vomiting. 11/23/21   McDiarmid, Leighton Roach, MD  promethazine-dextromethorphan (PROMETHAZINE-DM) 6.25-15 MG/5ML syrup Take 5 mLs by mouth 3 (three) times daily as needed for cough. 02/11/23   Bing Neighbors, NP  RiTUXimab (RITUXAN IV) Inject 1,000 mg into the vein See admin instructions. 1,000 mg into the vein every 6 months, then two weeks later another 1,000 mg injection    [provider]  rivaroxaban (XARELTO) 10 MG TABS tablet Take 1 tablet (10 mg total) by mouth daily. 08/10/22 08/05/23  McDiarmid, Leighton Roach, MD  tiZANidine (ZANAFLEX) 4 MG tablet Take 8 mg by mouth at bedtime. 02/08/22   [provider]  traMADol (ULTRAM) 50 MG tablet TAKE 1 TABLET BY MOUTH EVERY 6 HOURS AS NEEDED. 03/05/23  McDiarmid, Leighton Roach, MD      Allergies    Beta adrenergic blockers and Rocephin [ceftriaxone]    Review of Systems   Review of Systems  Physical Exam Updated Vital Signs BP 128/70 (BP Location: Right Arm)   Pulse 72   Temp 100.3 F (37.9 C) (Oral)   Resp 16   Ht 1.549 m (5\' 1" )   Wt 77.1 kg   SpO2 100%   BMI 32.12 kg/m  Physical Exam Vitals and nursing note reviewed.  HENT:     Head: Normocephalic.     Mouth/Throat:     Pharynx: Oropharynx is clear.  Eyes:     Extraocular Movements: Extraocular movements intact.  Cardiovascular:     Rate and Rhythm: Normal rate. Rhythm  irregular.  Pulmonary:     Effort: Pulmonary effort is normal.     Breath sounds: Rhonchi present.     Comments: Rhonchi present right greater than left lower 1/3-1/2 lung field Abdominal:     General: Bowel sounds are normal.     Palpations: Abdomen is soft.  Musculoskeletal:        General: Normal range of motion.     Cervical back: Normal range of motion.  Skin:    General: Skin is warm.     Capillary Refill: Capillary refill takes less than 2 seconds.  Neurological:     Mental Status: She is alert.  Psychiatric:        Mood and Affect: Mood normal.     ED Results / Procedures / Treatments   Labs (all labs ordered are listed, but only abnormal results are displayed) Labs Reviewed  COMPREHENSIVE METABOLIC PANEL - Abnormal; Notable for the following components:      Result Value   Potassium 3.4 (*)    BUN 5 (*)    All other components within normal limits  CBC WITH DIFFERENTIAL/PLATELET - Abnormal; Notable for the following components:   RBC 5.60 (*)    MCV 74.3 (*)    MCH 21.6 (*)    MCHC 29.1 (*)    RDW 15.8 (*)    Platelets 404 (*)    All other components within normal limits  I-STAT CG4 LACTIC ACID, ED - Abnormal; Notable for the following components:   Lactic Acid, Venous 2.6 (*)    All other components within normal limits  RESP PANEL BY RT-PCR (RSV, FLU A&B, COVID)  [id number]  URINALYSIS, ROUTINE W REFLEX MICROSCOPIC  I-STAT CG4 LACTIC ACID, ED    EKG ED ECG REPORT   Date: 04/07/2023  Rate: 71  Rhythm: normal sinus rhythm  QRS Axis: normal  Intervals: normal  ST/T Wave abnormalities: nonspecific ST changes  Conduction Disutrbances:none  Narrative Interpretation:   Old EKG Reviewed: unchanged  I have personally reviewed the EKG tracing and agree with the computerized printout as noted.   Radiology DG Chest 2 View  Result Date: 04/07/2023 CLINICAL DATA:  57 year old female with cough, fever, headache. EXAM: CHEST - 2 VIEW COMPARISON:  PET-CT  07/05/2022 and earlier. FINDINGS: Semi upright AP and lateral views of the chest at 1018 hours. Stable cardiomegaly and mediastinal contours. Visualized tracheal air column is within normal limits. Low normal lung volumes. No pneumothorax or pulmonary edema. But Patchy and confluent bilateral lower lung opacity, nonspecific. No pleural effusion. No acute osseous abnormality identified. Negative visible bowel gas. IMPRESSION: 1. Patchy bilateral lower lung opacity is nonspecific but suspicious for bilateral lung base Pneumonia in the setting of fever. No pleural effusion.  2. Chronic cardiomegaly. Electronically Signed   By: Odessa Fleming M.D.   On: 04/07/2023 11:11    Procedures Procedures    Medications Ordered in ED Medications  levofloxacin (LEVAQUIN) IVPB 750 mg (has no administration in time range)  lactated ringers bolus 1,000 mL (has no administration in time range)  acetaminophen (TYLENOL) tablet 650 mg (650 mg Oral Given 04/07/23 1013)  ondansetron (ZOFRAN-ODT) disintegrating tablet 8 mg (8 mg Oral Given 04/07/23 1117)    ED Course/ Medical Decision Making/ A&P                             Medical Decision Making Amount and/or Complexity of Data Reviewed Labs: ordered. Radiology: ordered.  Risk OTC drugs. Prescription drug management. Decision regarding hospitalization.  57 year old female history of Addison's and lupus presents today with cough and fever.  Patient febrile here on evaluation at 100.8.  She has crackles in her lungs.  Sats are normal.  Patient evaluated with labs that show normal CBC Complete metabolic panel with mildly decreased potassium COVID, flu, RSV negative, urinalysis clear, initial lactic acid elevated at 1.6. Patient is allergic to Rocephin Patient has Levaquin ordered   57 year old female with multiple comorbidities including SLE, history of PE, interstitial lung disease presents today with cough and fever.  Chest x-Jennie Hannay with increased markings throughout  bilateral lower lobes consistent with community-acquired pneumonia. Patient has Rocephin allergy. She is treated here with Levaquin. Given her comorbidities especially her interstitial lung disease, plan admission for observation and treatment. Discussed care with family practice resident and will see for admission       Final Clinical Impression(s) / ED Diagnoses Final diagnoses:  Community acquired pneumonia, unspecified laterality    Rx / DC Orders ED Discharge Orders     None         Margarita Grizzle, MD 04/07/23 1145

## 2023-04-07 NOTE — H&P (Cosign Needed Addendum)
Hospital Admission History and Physical Service Pager: [phone]  Patient name: Andrea Berry Medical record number: [id number] Date of Birth: June 07, 1966 Age: 57 y.o. Gender: female  Primary Care Provider: McDiarmid, Leighton Roach, MD Consultants: None Code Status: Full Code  Preferred Emergency Contact:  Contact Information     Name Relation Home Work Mobile   [place] Son   [phone]   Robbie Louis [phone]  [phone]      Other Contacts     Name Relation Home Work Mobile   ahligo,francoise Other   [phone]        Chief Complaint: headache  Assessment and Plan: Andrea Berry is a 57 y.o. female with past medical history of SLE, ILD, PE, TIA, HTN, anemia, blindness presenting with 3 days of headache, nasal congestion, fever, chills, and cough. Differential for presentation of this includes CAP, PE, COVID, and acute respiratory illness.  Likely community-acquired pneumonia with bibasilar crackles on exam and symptoms, confirmed with chest x-ray that revealed patchy bilateral low lung opacity suspicious for bilateral lung base pneumonia.  Unlikely to be a PE at this time due to compliance with Xarelto, stable VS, and no complaint of palpitations.  COVID and flu negative.  Due to numerous comorbid conditions patient was admitted to the hospital with her pneumonia.  [facility]     * (Principal) CAP (community acquired pneumonia)     With several days of headache, fever, nasal congestion, and cough  patient presented to the ED with bibasilar crackles on exam and chest  x-ray revealed patchy bilateral lower lung opacities suspicious for  bilateral lung base pneumonia. Lactic acid elevated to 2.6 but trended  down to 1.6. COVID and flu were negative. Due to her allergy to Rocephin,  patient was started on Levaquin in the ED. - Admit to FMTS, attending Dr. Manson Passey - Med-Surg, Vital signs per floor - Regular diet  - PT/OT to treat - VTE  prophylaxis: Xarelto, confirmed no missed doses - S/P Levaquin from ED discontinued 7/28, - Unasyn 1.5g and Doxycycline  100 mg BID (7/28- ) x 5 days - Likely transition Unasyn to Augmentin - Increasing home Prednisone 10 mg to 40 mg for 5 days (Day 1/5) - AM CBC/BMP      Chronic Stable Conditions HTN: continue home Diltiazem 180 mg daily, monitor Bps Anxiety: Ativan 1 mg BID PRN held due to patient reporting takign only 1-2 dose per week, can add back as needed.  History of PE: continue on Xarelto, monitor for symptoms of tachycardia or worsening SOB Complex Chronic Pain: continued home Gabapentin 900 BID and Duloxetine 60 mg daily, held Tramadol and Tizanidine  HLD: continue home Lipitor 40 mg daily GERD: continue home Protonix 40 mg daily SLE: On Rituximab 1000mg  outpatient every 6 months  FEN/GI: Regular Diet VTE Prophylaxis: Xarelto  Disposition: Med-Surg  History of Present Illness:  Andrea Berry is a 57 y.o. female presenting with headache, fever, nasal congestion, and cough for the past couple days.  She also reports nausea over the past couple days with no episodes of vomiting.  Denies any current symptoms and reports that her cough has improved.  Reports that she has been compliant with Xarelto at home, along with her gabapentin 900 mg twice daily, and prednisone 10 mg daily, tramadol as needed (not currently taking), and Ativan as needed 1-2 times per week.  Endorses some shortness of breath currently and does not use oxygen at  home. She also reports having some episodes of diarrhea at home. Denies chest pain, palpitations, abdominal pain, leg pain.  In the ED, patient was febrile at 100.8. Lactic acid elevated to 2.6>1.6. Crackles heard, saturating well on RA. COVID, Flu, and RSV were negative. UA negative. Due to her multiple co-morbidities and her CXR revealing bilateral lower lobe infiltrates patient is being admitted. Patient is allergic to Rocephin, so was started on  Levaquin.   Review Of Systems: Per HPI with the following additions: as above  Pertinent Past Medical History: SLE ILD PE 2017 TIA HTN GERD Anemia Blindness d/t Devic's disease Remainder reviewed in history tab.   Pertinent Past Surgical History: EGD and Colonoscopy 2018 Tubal Ligation Breast cyst excision  Remainder reviewed in history tab.   Pertinent Social History: Tobacco use: No Alcohol use: No Other Substance use: No Lives with son and fiance  Pertinent Family History: Mother - cancer Father - cancer Sister - lupus Remainder reviewed in history tab.   Important Outpatient Medications: Lipitor 40 mg Alendronate 70 mg qFriday Diltiazem 180 mg daily Xarelto 10 mg daily Gabapentin 900 mg BID Prednisone 10 mg daily Duloxetine 60 mg daily Tramadol 50 mg q6 PRN (not taking) Lorazepam 0.5 mg BID Nexium 20 mg daily at noon Linzess 145 mcg daily on empty stomach Oxcarbazepine 300 mg daily Promethazine-DM - 5 mL TID Rituximab IV Tizanidine 4 mg Remainder reviewed in medication history.   Objective: BP 128/70 (BP Location: Right Arm)   Pulse 72   Temp 100.3 F (37.9 C) (Oral)   Resp 16   Ht 5\' 1"  (1.549 m)   Wt 77.1 kg   SpO2 100%   BMI 32.12 kg/m  Exam: General: Pleasant blind woman in NAD. Awake and alert. Eyes: Blind in both eyes Cardiovascular: RRR. No M/R/G Respiratory: Diminished lung sounds. Bibasilar crackles noted. Gastrointestinal: Soft, non-tender, non-distended. Normoactive bowel sounds. Derm: warm and dry Neuro: AAOx3. No focal neurological deficits Psych: normal mood  Labs:  CBC BMET  Recent Labs  Lab 04/07/23 0859  WBC 6.8  HGB 12.1  HCT 41.6  PLT 404*   Recent Labs  Lab 04/07/23 0859  NA 137  K 3.4*  CL 102  CO2 23  BUN 5*  CREATININE 0.95  GLUCOSE 85  CALCIUM 9.4     Lactic acid: 2.6 > 1.6 UA: negative Respiratory panel: negative for COVID and Flu  EKG: Rate 71, NSR, LVH, diffuse ST depressions throughout  seen on prior EKGs as well.  Imaging Studies Performed: CXR 1. Patchy bilateral lower lung opacity is nonspecific but suspicious for bilateral lung base Pneumonia in the setting of fever. No pleural effusion. 2. Chronic cardiomegaly.  [name], DO  04/07/23, 1:44 PM  PGY-1, [facility]   I have reviewed the above and made the appropriate changes.   Glendale Chard, DO 04/07/2023, 1:42 PM PGY-2, [facility]  FPTS Intern pager: [phone], text pages welcome Secure chat group [facility] Teaching Service

## 2023-04-07 NOTE — ED Notes (Signed)
ED TO INPATIENT HANDOFF REPORT  ED Nurse Name and Phone #: Melburn Popper Name/Age/Gender Andrea Berry 57 y.o. female Room/Bed: H015C/H015C  Code Status   Code Status: Full Code  Home/SNF/Other Home Patient oriented to: self, place, time, and situation Is this baseline? Yes   Triage Complete: Triage complete  Chief Complaint Pneumonia [J18.9]  Triage Note Pt arrived POV from home c/o a headache x3 days. Pt does have a hx of Addison's disease and Lupus. Pt is also legally blind. Pt does a temperature in triage. Pt also endorses nausea and a cough.      Allergies Allergies  Allergen Reactions   Beta Adrenergic Blockers Other (See Comments)    Diagnosis of hypertrophic cardiomyopathy   Rocephin [Ceftriaxone] Other (See Comments)    Multiorgan inflammation with drug fever and multiple elevated inflammatory markers.  Resolved upon cessation of abx.    Level of Care/Admitting Diagnosis ED Disposition     ED Disposition  Admit   Condition  --   Comment  Hospital Area: [facility] [[id number]]  Level of Care: Med-Surg [16]  May place patient in observation at [facility] or [facility] if equivalent level of care is available:: No  Covid Evaluation: Confirmed COVID Negative  Diagnosis: Pneumonia [227785]  Admitting Physician: [name] [[id number]]  Attending Physician: Westley Chandler [[id number]]          B Medical/Surgery History Past Medical History:  Diagnosis Date   Acute left hemiparesis on chronic left hemiparesis 04/12/2022   Acute respiratory distress syndrome (ARDS) due to COVID-19 virus (HCC) 03/27/2019   Acute respiratory failure with hypoxia (HCC) 02/16/2020   Anemia of chronic disease 10/28/2015   Anxiety 07/12/2018   Arthritis    Ataxia 01/03/2016   Blind    Chronic central neuropathic pain 10/02/2018   [facility] 09/2017   Chronic coughing 09/10/2016   Chronic pain syndrome 07/12/2018   Chronic  respiratory failure with hypoxia (HCC)    Chronic rhinitis 01/07/2019   COVID-19 02/04/2020   Decreased strength 10/08/2018   Proximal leg muscles   Depression    Depression with anxiety 01/03/2016   Devic's disease (HCC)    Dysesthesia 01/03/2016   Dyspnea on exertion 11/07/2018   Elevated BP without diagnosis of hypertension 08/28/2019   Report of [facility] nurse in December with normal SBPs but DBPs in low 90s    Eustachian tube dysfunction 11/18/2018   L>R. Followed by [facility] ENT.   Eustachian tube dysfunction, bilateral 09/18/2018   Eustachian tube dysfunction, left 11/13/2018   L>R. Followed by [facility] ENT.   GERD (gastroesophageal reflux disease)    Greater trochanteric bursitis of left hip 04/27/2020   Formatting of this note might be different from the original. Added automatically from request for surgery [id number]  Formatting of this note might be different from the original. Added automatically from request for surgery [id number]   Headache(784.0)    History of chicken pox 10/08/2018   History of COVID-19 02/16/2020   History of CVA (cerebrovascular accident)    History of kidney stones    History of pulmonary embolism 12/10/2018   Lupus Antiphosholipid (+). Patient on secondary prophylaxis with rivaroxaban 20 mg daily long-term.     Hypertension    Hypertrophic cardiomyopathy (HCC) 03/02/2020   Hyponatremia 11/25/2015   Impairment of balance 10/08/2018   Legal blindness 01/30/2014   Long-term corticosteroid use 10/08/2018   Lupus (HCC)    Lupus anticoagulant disorder (HCC) 10/02/2018  Pulmonary Embolism 2017   Lupus cerebritis (HCC) 11/25/2015   Possible explanation of 11/25/2015 acute hyperintensity findings on Brain MRI.    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   Orthostatic dizziness, Recurrent 10/08/2018   Associated with medications   Otalgia 07/12/2018   Personal history of immunosupression therapy 10/08/2018   Chronic Azathioprine and prednisone  therapy for Devic's disease   Pigmented skin lesion of uncertain nature 11/06/2018   Pneumonia 02/21/2020   Pneumonia of right lower lobe due to infectious organism 02/21/2020   Postmenopause 10/21/2020   Pulmonary embolism (HCC) 2017   Shortness of breath    due to medication   Skin lesion of chest wall 11/07/2018   SLE (systemic lupus erythematosus related syndrome) (HCC) 11/07/2018   Smith antibody positive 04/07/2019   Stroke (HCC) 2008   visually impaired   Stroke (HCC) 04/13/2022   Syncope    Transaminasemia 10/21/2020   Ureteral calculi 2018   Vaginal odor 06/29/2022   Vitamin D deficiency 10/06/2018   Past Surgical History:  Procedure Laterality Date   BREAST BIOPSY     BREAST CYST EXCISION Left pt unsure   COLONOSCOPY WITH PROPOFOL N/A 07/26/2017   Procedure: COLONOSCOPY WITH PROPOFOL;  Surgeon: Jeani Hawking, MD;  Location: [facility];  Service: Endoscopy;  Laterality: N/A;   ESOPHAGOGASTRODUODENOSCOPY (EGD) WITH PROPOFOL N/A 07/26/2017   Procedure: ESOPHAGOGASTRODUODENOSCOPY (EGD) WITH PROPOFOL;  Surgeon: Jeani Hawking, MD;  Location: [facility];  Service: Endoscopy;  Laterality: N/A;   PORTA CATH INSERTION     TUBAL LIGATION     TUBAL LIGATION       A IV Location/Drains/Wounds Patient Lines/Drains/Airways Status     Active Line/Drains/Airways     Name Placement date Placement time Site Days   Peripheral IV 04/07/23 22 G 2.5" Anterior;Left Forearm 04/07/23  1310  Forearm  less than 1            Intake/Output Last 24 hours No intake or output data in the 24 hours ending 04/07/23 1502  Labs/Imaging Results for orders placed or performed during the hospital encounter of 04/07/23 (from the past 48 hour(s))  Resp panel by RT-PCR (RSV, Flu A&B, Covid) Anterior Nasal Swab     Status: None   Collection Time: 04/07/23  7:41 AM   Specimen: Anterior Nasal Swab  Result Value Ref Range   SARS Coronavirus 2 by RT PCR NEGATIVE NEGATIVE   Influenza A by PCR  NEGATIVE NEGATIVE   Influenza B by PCR NEGATIVE NEGATIVE    Comment: (NOTE) The Xpert Xpress SARS-CoV-2/FLU/RSV plus assay is intended as an aid in the diagnosis of influenza from Nasopharyngeal swab specimens and should not be used as a sole basis for treatment. Nasal washings and aspirates are unacceptable for Xpert Xpress SARS-CoV-2/FLU/RSV testing.  Fact Sheet for Patients: BloggerCourse.com  Fact Sheet for Healthcare Providers: SeriousBroker.it  This test is not yet approved or cleared by the [place] FDA and has been authorized for detection and/or diagnosis of SARS-CoV-2 by FDA under an Emergency Use Authorization (EUA). This EUA will remain in effect (meaning this test can be used) for the duration of the COVID-19 declaration under Section 564(b)(1) of the Act, 21 U.S.C. section 360bbb-3(b)(1), unless the authorization is terminated or revoked.     Resp Syncytial Virus by PCR NEGATIVE NEGATIVE    Comment: (NOTE) Fact Sheet for Patients: BloggerCourse.com  Fact Sheet for Healthcare Providers: SeriousBroker.it  This test is not yet approved or cleared by the [place] FDA and has been  authorized for detection and/or diagnosis of SARS-CoV-2 by FDA under an Emergency Use Authorization (EUA). This EUA will remain in effect (meaning this test can be used) for the duration of the COVID-19 declaration under Section 564(b)(1) of the Act, 21 U.S.C. section 360bbb-3(b)(1), unless the authorization is terminated or revoked.  Performed at [facility], [address] [place], [place] [address]   Urinalysis, Routine w reflex microscopic -Urine, Clean Catch     Status: None   Collection Time: 04/07/23  8:51 AM  Result Value Ref Range   Color, Urine YELLOW YELLOW   APPearance CLEAR CLEAR   Specific Gravity, Urine 1.017 1.005 - 1.030   pH 7.0 5.0 - 8.0   Glucose,  UA NEGATIVE NEGATIVE mg/dL   Hgb urine dipstick NEGATIVE NEGATIVE   Bilirubin Urine NEGATIVE NEGATIVE   Ketones, ur NEGATIVE NEGATIVE mg/dL   Protein, ur NEGATIVE NEGATIVE mg/dL   Nitrite NEGATIVE NEGATIVE   Leukocytes,Ua NEGATIVE NEGATIVE    Comment: Performed at [facility], [address] [place], [place] [address]  Comprehensive metabolic panel     Status: Abnormal   Collection Time: 04/07/23  8:59 AM  Result Value Ref Range   Sodium 137 135 - 145 mmol/L   Potassium 3.4 (L) 3.5 - 5.1 mmol/L   Chloride 102 98 - 111 mmol/L   CO2 23 22 - 32 mmol/L   Glucose, Bld 85 70 - 99 mg/dL    Comment: Glucose reference range applies only to samples taken after fasting for at least 8 hours.   BUN 5 (L) 6 - 20 mg/dL   Creatinine, Ser 6.43 0.44 - 1.00 mg/dL   Calcium 9.4 8.9 - 32.9 mg/dL   Total Protein 7.2 6.5 - 8.1 g/dL   Albumin 3.6 3.5 - 5.0 g/dL   AST 20 15 - 41 U/L   ALT 15 0 - 44 U/L   Alkaline Phosphatase 73 38 - 126 U/L   Total Bilirubin 0.5 0.3 - 1.2 mg/dL   GFR, Estimated >51 >88 mL/min    Comment: (NOTE) Calculated using the CKD-EPI Creatinine Equation (2021)    Anion gap 12 5 - 15    Comment: Performed at [facility], [address] [place], [place] [address]  CBC with Differential     Status: Abnormal   Collection Time: 04/07/23  8:59 AM  Result Value Ref Range   WBC 6.8 4.0 - 10.5 K/uL   RBC 5.60 (H) 3.87 - 5.11 MIL/uL   Hemoglobin 12.1 12.0 - 15.0 g/dL   HCT 63.0 16.0 - 10.9 %   MCV 74.3 (L) 80.0 - 100.0 fL   MCH 21.6 (L) 26.0 - 34.0 pg   MCHC 29.1 (L) 30.0 - 36.0 g/dL   RDW 32.3 (H) 55.7 - 32.2 %   Platelets 404 (H) 150 - 400 K/uL   nRBC 0.0 0.0 - 0.2 %   Neutrophils Relative % 74 %   Neutro Abs 5.0 1.7 - 7.7 K/uL   Lymphocytes Relative 16 %   Lymphs Abs 1.1 0.7 - 4.0 K/uL   Monocytes Relative 8 %   Monocytes Absolute 0.6 0.1 - 1.0 K/uL   Eosinophils Relative 1 %   Eosinophils Absolute 0.1 0.0 - 0.5 K/uL   Basophils Relative 0 %   Basophils  Absolute 0.0 0.0 - 0.1 K/uL   Immature Granulocytes 1 %   Abs Immature Granulocytes 0.07 0.00 - 0.07 K/uL    Comment: Performed at [facility]  Lab, [address] [place], [place] [address]  I-Stat Lactic Acid, ED     Status: Abnormal   Collection Time: 04/07/23 10:17 AM  Result Value Ref Range   Lactic Acid, Venous 2.6 (HH) 0.5 - 1.9 mmol/L   Comment NOTIFIED PHYSICIAN   I-Stat Lactic Acid, ED     Status: None   Collection Time: 04/07/23 11:16 AM  Result Value Ref Range   Lactic Acid, Venous 1.6 0.5 - 1.9 mmol/L   DG Chest 2 View  Result Date: 04/07/2023 CLINICAL DATA:  57 year old female with cough, fever, headache. EXAM: CHEST - 2 VIEW COMPARISON:  PET-CT 07/05/2022 and earlier. FINDINGS: Semi upright AP and lateral views of the chest at 1018 hours. Stable cardiomegaly and mediastinal contours. Visualized tracheal air column is within normal limits. Low normal lung volumes. No pneumothorax or pulmonary edema. But Patchy and confluent bilateral lower lung opacity, nonspecific. No pleural effusion. No acute osseous abnormality identified. Negative visible bowel gas. IMPRESSION: 1. Patchy bilateral lower lung opacity is nonspecific but suspicious for bilateral lung base Pneumonia in the setting of fever. No pleural effusion. 2. Chronic cardiomegaly. Electronically Signed   By: Odessa Fleming M.D.   On: 04/07/2023 11:11    Pending Labs Unresulted Labs (From admission, onward)     Start     Ordered   04/08/23 0500  Basic metabolic panel  Tomorrow morning,   R        04/07/23 1232   04/08/23 0500  CBC  Tomorrow morning,   R        04/07/23 1232            Vitals/Pain Today's Vitals   04/07/23 0735 04/07/23 0737 04/07/23 1012 04/07/23 1042  BP: 135/62   128/70  Pulse: 98   72  Resp: 15   16  Temp: (!) 100.8 F (38.2 C)   100.3 F (37.9 C)  TempSrc:    Oral  SpO2: 100%   100%  Weight:  170 lb (77.1 kg)    Height:  5\' 1"  (1.549 m)    PainSc:  6  9      Isolation  Precautions No active isolations  Medications Medications  atorvastatin (LIPITOR) tablet 40 mg (has no administration in time range)  diltiazem (CARDIZEM CD) 24 hr capsule 180 mg (has no administration in time range)  DULoxetine (CYMBALTA) DR capsule 60 mg (has no administration in time range)  pantoprazole (PROTONIX) EC tablet 40 mg (has no administration in time range)  rivaroxaban (XARELTO) tablet 10 mg (has no administration in time range)  gabapentin (NEURONTIN) capsule 900 mg (has no administration in time range)  acetaminophen (TYLENOL) tablet 650 mg (has no administration in time range)    Or  acetaminophen (TYLENOL) suppository 650 mg (has no administration in time range)  predniSONE (DELTASONE) tablet 40 mg (40 mg Oral Given 04/07/23 1333)  Ampicillin-Sulbactam (UNASYN) 3 g in sodium chloride 0.9 % 100 mL IVPB (0 g Intravenous Stopped 04/07/23 1450)  doxycycline (VIBRA-TABS) tablet 100 mg (100 mg Oral Given 04/07/23 1333)  potassium chloride SA (KLOR-CON M) CR tablet 40 mEq (has no administration in time range)  acetaminophen (TYLENOL) tablet 650 mg (650 mg Oral Given 04/07/23 1013)  ondansetron (ZOFRAN-ODT) disintegrating tablet 8 mg (8 mg Oral Given 04/07/23 1117)  lactated ringers bolus 1,000 mL (0 mLs Intravenous Stopped 04/07/23 1450)    Mobility walks     Focused Assessments    R Recommendations: See Admitting Provider Note  Report  given to:   Additional Notes:

## 2023-04-07 NOTE — ED Triage Notes (Addendum)
Pt arrived POV from home c/o a headache x3 days. Pt does have a hx of Addison's disease and Lupus. Pt is also legally blind. Pt does a temperature in triage. Pt also endorses nausea and a cough.

## 2023-04-07 NOTE — Progress Notes (Signed)
FMTS Brief Progress Note  S: Patient seen at bedside with Dr. Laroy Apple.  Patient resting comfortably in bed.  She denies trouble breathing, and reports that she is doing better.  No other concerns at this time.   O: BP (!) 119/52 (BP Location: Left Arm)   Pulse 82   Temp 98.6 F (37 C) (Oral)   Resp 18   Ht 5\' 1"  (1.549 m)   Wt 77.1 kg   SpO2 94%   BMI 32.12 kg/m   General: No acute distress Eyes: Blind in both eyes Cardiovascular: RRR Lungs: Somewhat diminished lung sounds, with crackles noted bilaterally.  On room air.  A/P: Community-acquired pneumonia -Plan per day team H&P, no changes needed at this time.  Cyndia Skeeters, DO 04/07/2023, 10:08 PM PGY-1, [facility] Night Resident  Please page [phone] with questions.

## 2023-04-07 NOTE — ED Notes (Signed)
Patient transported to X-ray 

## 2023-04-07 NOTE — Hospital Course (Addendum)
Linzy Alexandra is a 57 y.o.female with a history of SLE, ILD, PE, TIA, HTN, anemia, blindness who was admitted to the [facility] Medicine Teaching Service at [facility] for Community Acquired Pneumonia. Her hospital course is detailed below:  Community Acquired Pneumonia In the ED, patient was febrile at 100.8. Lactic acid elevated to 2.6>1.6. Crackles heard, saturating well on RA. COVID, Flu, and RSV were negative. UA negative. Due to her multiple co-morbidities and her CXR revealing bilateral lower lobe infiltrates patient is being admitted.  Patient was treated with antibiotics including Levaquin, Unasyn, and Doxycycline started on 7/28. Discharged home on Augmentin 875 mg BID and Doxycycline 100 mg BID for 3 days (abx regimen complete on 8/1). Home prednisone was increased to 40 mg for 5 days (7/28-8/1).  Other chronic conditions were medically managed with home medications and formulary alternatives as necessary:  HTN: Continued home Diltiazem 180 mg daily, monitor Bps Anxiety: Ativan 1 mg BID PRN held due to patient reporting takign only 1-2 dose per week, can add back as needed.  History of PE: Continued on Xarelto, monitor for symptoms of tachycardia or worsening SOB Complex Chronic Pain: Continued home Gabapentin 900 BID and Duloxetine 60 mg daily, held Tramadol and Tizanidine  HLD: Continued home Lipitor 40 mg daily GERD: Continued home Nexium with formulary alternative of Protonix 40 mg daily SLE: On Rituximab 1000mg  outpatient every 6 months  PCP Follow-up Recommendations: Last Echo 08/23 showed EF 60-65% with moderate asymmetric LVH. Consider repeating echo outpatient.  Ensure that patient completed her antibiotic regimen and restart her 10 mg Prednisone starting 8/2.  Iron studies outpatient.

## 2023-04-07 NOTE — Assessment & Plan Note (Addendum)
With several days of headache, fever, nasal congestion, and cough patient presented to the ED with bibasilar crackles on exam and chest x-ray revealed patchy bilateral lower lung opacities suspicious for bilateral lung base pneumonia. Lactic acid elevated to 2.6 but trended down to 1.6. COVID and flu were negative. Due to her allergy to Rocephin, patient was started on Levaquin in the ED. - Admit to FMTS, attending Dr. Manson Passey - Med-Surg, Vital signs per floor - Regular diet  - PT/OT to treat - VTE prophylaxis: Xarelto, with no missed doses - Continue antibiotics: IV Levaquin from ED discontinued 7/28, switched to to Unasyn 1.5g and Doxycycline 100 mg BID (7/28- ) x 5 days - Likely transition Unasyn to Augmentin and Doxycyline tomorrow  - Increasing home Prednisone 10 mg to 40 mg for 5 days (Day 1/5) - AM CBC/BMP

## 2023-04-07 NOTE — ED Notes (Signed)
Pt ambulated to restroom slowly with supervision but without assistance

## 2023-04-08 ENCOUNTER — Other Ambulatory Visit (HOSPITAL_COMMUNITY): Payer: Self-pay

## 2023-04-08 DIAGNOSIS — J189 Pneumonia, unspecified organism: Secondary | ICD-10-CM | POA: Diagnosis not present

## 2023-04-08 DIAGNOSIS — D508 Other iron deficiency anemias: Secondary | ICD-10-CM | POA: Diagnosis not present

## 2023-04-08 DIAGNOSIS — Z86711 Personal history of pulmonary embolism: Secondary | ICD-10-CM | POA: Diagnosis not present

## 2023-04-08 DIAGNOSIS — Z1152 Encounter for screening for COVID-19: Secondary | ICD-10-CM | POA: Diagnosis not present

## 2023-04-08 MED ORDER — DOXYCYCLINE HYCLATE 100 MG PO TABS
100.0000 mg | ORAL_TABLET | Freq: Two times a day (BID) | ORAL | 0 refills | Status: AC
  Filled 2023-04-08: qty 6, 3d supply, fill #0

## 2023-04-08 MED ORDER — PREDNISONE 20 MG PO TABS
40.0000 mg | ORAL_TABLET | Freq: Every day | ORAL | 0 refills | Status: AC
  Filled 2023-04-08: qty 6, 3d supply, fill #0

## 2023-04-08 MED ORDER — AMOXICILLIN-POT CLAVULANATE 875-125 MG PO TABS: 1.0000 | ORAL_TABLET | Freq: Two times a day (BID) | ORAL | Status: DC

## 2023-04-08 MED ORDER — GABAPENTIN 300 MG PO CAPS: 600.0000 mg | ORAL_CAPSULE | Freq: Two times a day (BID) | ORAL | Status: DC

## 2023-04-08 MED ORDER — AMOXICILLIN-POT CLAVULANATE 875-125 MG PO TABS
1.0000 | ORAL_TABLET | Freq: Two times a day (BID) | ORAL | 0 refills | Status: AC
  Filled 2023-04-08: qty 6, 3d supply, fill #0

## 2023-04-08 MED ORDER — PREDNISONE 10 MG PO TABS
5.0000 mg | ORAL_TABLET | Freq: Every day | ORAL | 0 refills | Status: DC
Start: 2023-04-12 — End: 2023-06-19
  Filled 2023-04-08: qty 15, 30d supply, fill #0

## 2023-04-08 NOTE — Progress Notes (Signed)
PT Cancellation Note  Patient Details Name: Andrea Berry MRN: [id number] DOB: 04-Jun-1966   Cancelled Treatment:    Reason Eval/Treat Not Completed: PT screened, no needs identified, will sign off. No skilled acute PT needs per discussion with OT. Please re-consult if there is a change in functional status.   Wynn Maudlin, DPT Acute Rehabilitation Services Office [phone]  04/08/23 8:50 AM

## 2023-04-08 NOTE — Discharge Summary (Signed)
Family Medicine Teaching [facility] Discharge Summary  Patient name: Andrea Berry Medical record number: [id number] Date of birth: 1965/11/03 Age: 57 y.o. Gender: female Date of Admission: 04/07/2023  Date of Discharge: 04/08/23  Admitting Physician: [name], DO  Primary Care Provider: McDiarmid, Leighton Roach, MD Consultants: none  Indication for Hospitalization: CAP  Brief Hospital Course:  Andrea Berry is a 57 y.o.female with a history of SLE, ILD, PE, TIA, HTN, anemia, blindness who was admitted to the [facility] Medicine Teaching Service at [facility] for Community Acquired Pneumonia. Her hospital course is detailed below:  Community Acquired Pneumonia In the ED, patient was febrile at 100.8. Lactic acid elevated to 2.6>1.6. Crackles heard, saturating well on RA. COVID, Flu, and RSV were negative. UA negative. Due to her multiple co-morbidities and her CXR revealing bilateral lower lobe infiltrates patient is being admitted.  Patient was treated with antibiotics including Levaquin, Unasyn, and Doxycycline started on 7/28. Discharged home on Augmentin 875 mg BID and Doxycycline 100 mg BID for 3 days (abx regimen complete on 8/1). Home prednisone was increased to 40 mg for 5 days (7/28-8/1).  Other chronic conditions were medically managed with home medications and formulary alternatives as necessary:  HTN: Continued home Diltiazem 180 mg daily, monitor Bps Anxiety: Ativan 1 mg BID PRN held due to patient reporting takign only 1-2 dose per week, can add back as needed.  History of PE: Continued on Xarelto, monitor for symptoms of tachycardia or worsening SOB Complex Chronic Pain: Continued home Gabapentin 900 BID and Duloxetine 60 mg daily, held Tramadol and Tizanidine  HLD: Continued home Lipitor 40 mg daily GERD: Continued home Nexium with formulary alternative of Protonix 40 mg daily SLE: On Rituximab 1000mg  outpatient every 6 months  PCP Follow-up Recommendations: Last  Echo 08/23 showed EF 60-65% with moderate asymmetric LVH. Consider repeating echo outpatient.  Ensure that patient completed her antibiotic regimen and restart her 10 mg Prednisone starting 8/2.  Iron studies outpatient.   Discharge Diagnoses/Problem List:  Principal Problem:   CAP (community acquired pneumonia)  Disposition: Home  Discharge Condition: Stable  Discharge Exam:  General: Pleasant blind woman in NAD. Awake and alert. Eyes: Blind in both eyes Cardiovascular: RRR. No M/R/G Respiratory: Diminished lung sounds. Bibasilar crackles noted. Gastrointestinal: Soft, non-tender, non-distended. Normoactive bowel sounds.  Significant Procedures: none  Significant Labs and Imaging:  Recent Labs  Lab 04/07/23 0859 04/08/23 0709  WBC 6.8 8.1  HGB 12.1 10.7*  HCT 41.6 36.1  PLT 404* 367   Recent Labs  Lab 04/07/23 0859 04/08/23 0709  NA 137 142  K 3.4* 3.9  CL 102 110  CO2 23 24  GLUCOSE 85 86  BUN 5* 7  CREATININE 0.95 0.81  CALCIUM 9.4 9.1  ALKPHOS 73  --   AST 20  --   ALT 15  --   ALBUMIN 3.6  --     CXR - 04/07/23 1. Patchy bilateral lower lung opacity is nonspecific but suspicious for bilateral lung base Pneumonia in the setting of fever. No pleural effusion. 2. Chronic cardiomegaly.  Results/Tests Pending at Time of Discharge: none  Discharge Medications:  Allergies as of 04/08/2023       Reactions   Beta Adrenergic Blockers Other (See Comments)   Diagnosis of hypertrophic cardiomyopathy   Rocephin [ceftriaxone] Other (See Comments)   Multiorgan inflammation with drug fever and multiple elevated inflammatory markers.  Resolved upon cessation of abx.        Medication List  STOP taking these medications    esomeprazole 20 MG capsule Commonly known as: NEXIUM       TAKE these medications    alendronate 70 MG tablet Commonly known as: FOSAMAX Take 1 tablet (70 mg total) by mouth every Friday.   Altamist Spray 0.65 % nasal  spray Generic drug: sodium chloride Place 2 sprays into the nose 2 (two) times daily.   amoxicillin-clavulanate 875-125 MG tablet Commonly known as: AUGMENTIN Take 1 tablet by mouth every 12 (twelve) hours for 3 days.   atorvastatin 40 MG tablet Commonly known as: LIPITOR Take 40 mg by mouth daily.   diltiazem 180 MG 24 hr capsule Commonly known as: CARDIZEM CD TAKE 1 CAPSULE BY MOUTH EVERY DAY   doxycycline 100 MG tablet Commonly known as: VIBRA-TABS Take 1 tablet (100 mg total) by mouth 2 (two) times daily for 3 days.   DULoxetine 60 MG capsule Commonly known as: CYMBALTA TAKE 1 CAPSULE BY MOUTH EVERY DAY What changed: how much to take   gabapentin 300 MG capsule Commonly known as: NEURONTIN Take 2 capsules (600 mg total) by mouth 2 (two) times daily. What changed: when to take this   LORazepam 0.5 MG tablet Commonly known as: ATIVAN TAKE 1 TABLET BY MOUTH TWICE A DAY   Oxcarbazepine 300 MG tablet Commonly known as: TRILEPTAL Take 300 mg by mouth daily.   predniSONE 20 MG tablet Commonly known as: DELTASONE Take 2 tablets (40 mg total) by mouth daily for 3 days. What changed:  medication strength how much to take   predniSONE 10 MG tablet Commonly known as: DELTASONE Take 0.5 tablets (5 mg total) by mouth daily. Start taking on: April 12, 2023 What changed: You were already taking a medication with the same name, and this prescription was added. Make sure you understand how and when to take each.   RITUXAN IV Inject 1,000 mg into the vein See admin instructions. 1,000 mg into the vein every 6 months, then two weeks later another 1,000 mg injection   rivaroxaban 10 MG Tabs tablet Commonly known as: Xarelto Take 1 tablet (10 mg total) by mouth daily.   tiZANidine 4 MG tablet Commonly known as: ZANAFLEX Take 8 mg by mouth at bedtime.   traMADol 50 MG tablet Commonly known as: ULTRAM TAKE 1 TABLET BY MOUTH EVERY 6 HOURS AS NEEDED.   Vitamin D3 50 MCG  (2000 UT) Tabs Take 2 tablets by mouth daily.        Discharge Instructions: Please refer to Patient Instructions section of EMR for full details.  Patient was counseled important signs and symptoms that should prompt return to medical care, changes in medications, dietary instructions, activity restrictions, and follow up appointments.   Follow-Up Appointments:   [name], DO 04/08/2023, 11:03 AM PGY-1, [facility]

## 2023-04-08 NOTE — Plan of Care (Signed)
A/Ox4 and on room air. No complaints of pain this shift. Q6 amp/tazo given. Took meds as ordered. Up with 1 person assist. No overnight issues.    Problem: Education: Goal: Knowledge of General Education information will improve Description: Including pain rating scale, medication(s)/side effects and non-pharmacologic comfort measures Outcome: Progressing   Problem: Health Behavior/Discharge Planning: Goal: Ability to manage health-related needs will improve Outcome: Progressing   Problem: Clinical Measurements: Goal: Ability to maintain clinical measurements within normal limits will improve Outcome: Progressing Goal: Will remain free from infection Outcome: Progressing Goal: Diagnostic test results will improve Outcome: Progressing Goal: Respiratory complications will improve Outcome: Progressing Goal: Cardiovascular complication will be avoided Outcome: Progressing   Problem: Activity: Goal: Risk for activity intolerance will decrease Outcome: Progressing   Problem: Nutrition: Goal: Adequate nutrition will be maintained Outcome: Progressing   Problem: Coping: Goal: Level of anxiety will decrease Outcome: Progressing   Problem: Elimination: Goal: Will not experience complications related to bowel motility Outcome: Progressing Goal: Will not experience complications related to urinary retention Outcome: Progressing   Problem: Pain Managment: Goal: General experience of comfort will improve Outcome: Progressing   Problem: Safety: Goal: Ability to remain free from injury will improve Outcome: Progressing   Problem: Skin Integrity: Goal: Risk for impaired skin integrity will decrease Outcome: Progressing

## 2023-04-08 NOTE — Evaluation (Signed)
Occupational Therapy Evaluation Patient Details Name: Andrea Berry MRN: [id number] DOB: 06/23/1966 Today's Date: 04/08/2023   History of Present Illness Andrea Berry is a 57 y.o. female who presented with 3 days of headache, nasal congestion, fever, chills, and cough.   Likely community-acquired pneumonia with bibasilar crackles on exam and symptoms, confirmed with chest x-ray that revealed patchy bilateral low lung opacity suspicious for bilateral lung base pneumonia. PMH includes: multiple prior strokes with L sided hemiparesis, SLE, hx of PE on xarelto, legally blind.   Clinical Impression   [name] was evaluated s/p the above admission list. She is mod I with use of a white sight cane for community mobility at baseline. Upon evaluation, pt demonstrated mod I ability to complete mobility and ADLs. HHA assist utilized for mobility to compensate for low vision, otherwise pt reports she feels back to her baseline. Pt does not require further acute, or follow up OT services. Recommend discharge back to pt's environment with assist as needed. OT to sign off with appreciation of order, please re-consult if needed.       Recommendations for follow up therapy are one component of a multi-disciplinary discharge planning process, led by the attending physician.  Recommendations may be updated based on patient status, additional functional criteria and insurance authorization.   Assistance Recommended at Discharge PRN  Patient can return home with the following Assist for transportation    Functional Status Assessment  Patient has had a recent decline in their functional status and demonstrates the ability to make significant improvements in function in a reasonable and predictable amount of time.  Equipment Recommendations  None recommended by OT       Precautions / Restrictions Precautions Precautions: Fall Precaution Comments: low vision Restrictions Weight Bearing Restrictions: No       Mobility Bed Mobility Overal bed mobility: Independent                  Transfers Overall transfer level: Independent                 General transfer comment: HHA for mobility to compensate for low vision      Balance Overall balance assessment: No apparent balance deficits (not formally assessed)               ADL either performed or assessed with clinical judgement   ADL Overall ADL's : Modified independent       General ADL Comments: at her mod I baseline. HHA for mobility as pt did not have her sight cane this date.     Vision Baseline Vision/History: 2 Legally blind Ability to See in Adequate Light: 3 Highly impaired Patient Visual Report: No change from baseline Vision Assessment?: Vision impaired- to be further tested in functional context Additional Comments: low vision at baseline, pt reports she sees shapes and shadows only.     Perception Perception Perception Tested?: No   Praxis Praxis Praxis tested?: Not tested    Pertinent Vitals/Pain Pain Assessment Pain Assessment: No/denies pain     Hand Dominance Right   Extremity/Trunk Assessment Upper Extremity Assessment Upper Extremity Assessment: Overall WFL for tasks assessed   Lower Extremity Assessment Lower Extremity Assessment: Overall WFL for tasks assessed   Cervical / Trunk Assessment Cervical / Trunk Assessment: Normal   Communication Communication Communication: No difficulties   Cognition Arousal/Alertness: Awake/alert Behavior During Therapy: WFL for tasks assessed/performed Overall Cognitive Status: Within Functional Limits for tasks assessed       General  Comments: Overall WFL, pt states she is feeling better, near her baseline and is ready to go home.     General Comments  VSS on RA, no family present            Home Living Family/patient expects to be discharged to:: Private residence Living Arrangements: Spouse/significant  other;Children Available Help at Discharge: Family;Available PRN/intermittently Type of Home: House Home Access: Stairs to enter [employer] of Steps: 2 Entrance Stairs-Rails: Right;Left Home Layout: One level     Bathroom Shower/Tub: Chief Strategy Officer: Standard     Home Equipment: Agricultural consultant (2 wheels);Cane - single point;Shower seat;BSC/3in1;Wheelchair - manual;Toilet riser          Prior Functioning/Environment Prior Level of Function : Independent/Modified Independent;Working/employed             Mobility Comments: Uses white sight cane for community mobility ADLs Comments: indep, family assists with IADLs as needed. Works.        OT Problem List: Decreased activity tolerance         OT Goals(Current goals can be found in the care plan section) Acute Rehab OT Goals Patient Stated Goal: home today OT Goal Formulation: With patient Time For Goal Achievement: 04/22/23 Potential to Achieve Goals: Good   AM-PAC OT "6 Clicks" Daily Activity     Outcome Measure Help from another person eating meals?: None Help from another person taking care of personal grooming?: None Help from another person toileting, which includes using toliet, bedpan, or urinal?: A Little Help from another person bathing (including washing, rinsing, drying)?: None Help from another person to put on and taking off regular upper body clothing?: None Help from another person to put on and taking off regular lower body clothing?: None 6 Click Score: 23   End of Session Equipment Utilized During Treatment: Gait belt Nurse Communication: Mobility status  Activity Tolerance: Patient tolerated treatment well Patient left: in bed;with bed alarm set;with call bell/phone within reach  OT Visit Diagnosis: Muscle weakness (generalized) (M62.81)                Time: 0102-7253 OT Time Calculation (min): 11 min Charges:  OT General Charges $OT Visit: 1 Visit OT  Evaluation $OT Eval Low Complexity: 1 Low  Derenda Mis, OTR/L Acute Rehabilitation Services Office [phone] Secure Chat Communication Preferred   Donia Pounds 04/08/2023, 8:49 AM

## 2023-04-08 NOTE — Progress Notes (Signed)
This nurse reviewed DC instructions with patient at this time. TOC pharmacy states she has medications ready for patient to pick up on her way out. Patient called her ride and will let this nurse know when they are on their way. This nurse paged MD about patient requesting work note to stay home and get better until August 2. Awaiting on MD response. Cephus Shelling., RN

## 2023-04-08 NOTE — Discharge Instructions (Addendum)
Dear Geroge Baseman,  Thank you for letting us participate in your care. You were hospitalized for CAP (community acquired pneumonia). You were treated with antibiotics and increased dose of your prednisone. Continue your antibiotics till 8/1. Follow up on 8/2 when we will reassess your symptoms and restart your Prednisone 10 mg on 8/2.  POST-HOSPITAL & CARE INSTRUCTIONS Go to your follow up appointments (listed below)   DOCTOR'S APPOINTMENT   Future Appointments  Date Time Provider Department Center  04/12/2023  8:45 AM [name], DO [facility]  04/03/2024  9:15 AM [facility] ANNUAL WELLNESS VISIT [facility]     Take care and be well!  Family Medicine Teaching Service Inpatient Team [place]  [facility]  [address] [place], [place] [address] [phone]

## 2023-04-09 ENCOUNTER — Telehealth: Payer: Self-pay

## 2023-04-09 NOTE — Transitions of Care (Post Inpatient/ED Visit) (Signed)
04/09/2023  Name: Andrea Berry MRN: [id number] DOB: March 08, 1966  Today's TOC FU Call Status: Today's TOC FU Call Status:: Successful TOC FU Call Competed TOC FU Call Complete Date: 04/09/23  Transition Care Management Follow-up Telephone Call Date of Discharge: 04/08/23 Discharge Facility: [facility]) Type of Discharge: Inpatient Admission Primary Inpatient Discharge Diagnosis:: pneumonia How have you been since you were released from the hospital?: Better Any questions or concerns?: No  Items Reviewed: Did you receive and understand the discharge instructions provided?: Yes Medications obtained,verified, and reconciled?: Yes (Medications Reviewed) Any new allergies since your discharge?: No Dietary orders reviewed?: Yes Do you have support at home?: Yes People in Home: child(ren), adult, significant other  Medications Reviewed Today: Medications Reviewed Today     Reviewed by Karena Addison, LPN (Licensed Practical Nurse) on 04/09/23 at 0848  Med List Status: <None>   Medication Order Taking? Sig Documenting Provider Last Dose Status Informant  alendronate (FOSAMAX) 70 MG tablet [id number] No Take 1 tablet (70 mg total) by mouth every Friday. McDiarmid, Leighton Roach, MD Past Week Active Self, Pharmacy Records  ALTAMIST SPRAY 0.65 % nasal spray [id number] No Place 2 sprays into the nose 2 (two) times daily. [provider] Past Week Active Self, Pharmacy Records  amoxicillin-clavulanate (AUGMENTIN) 875-125 MG tablet [id number]  Take 1 tablet by mouth every 12 (twelve) hours for 3 days. Alicia Amel, MD  Active   atorvastatin (LIPITOR) 40 MG tablet [id number] No Take 40 mg by mouth daily. [provider] Past Week Active Self, Pharmacy Records  Cholecalciferol (VITAMIN D3) 50 MCG (2000 UT) TABS [id number] No Take 2 tablets by mouth daily. [provider] Past Week Active Self, Pharmacy Records  diltiazem (CARDIZEM CD) 180 MG 24 hr capsule [id number]  No TAKE 1 CAPSULE BY MOUTH EVERY DAY McDiarmid, Leighton Roach, MD Past Week Active Self, Pharmacy Records  doxycycline (VIBRA-TABS) 100 MG tablet [id number]  Take 1 tablet (100 mg total) by mouth 2 (two) times daily for 3 days. Alicia Amel, MD  Active   DULoxetine (CYMBALTA) 60 MG capsule [id number] No TAKE 1 CAPSULE BY MOUTH EVERY DAY  Patient taking differently: Take 60 mg by mouth daily.   McDiarmid, Leighton Roach, MD Past Week Active Self, Pharmacy Records  esomeprazole (NEXIUM) 20 MG capsule [id number] No TAKE 1 CAPSULE (20 MG TOTAL) BY MOUTH DAILY AT 12 NOON. McDiarmid, Leighton Roach, MD Past Week Active Self, Pharmacy Records  gabapentin (NEURONTIN) 300 MG capsule [id number]  Take 2 capsules (600 mg total) by mouth 2 (two) times daily. Alicia Amel, MD  Active   LORazepam (ATIVAN) 0.5 MG tablet [id number] No TAKE 1 TABLET BY MOUTH TWICE A DAY McDiarmid, Leighton Roach, MD Past Month Active Self, Pharmacy Records  Oxcarbazepine (TRILEPTAL) 300 MG tablet [id number] No Take 300 mg by mouth daily. [provider] Past Week Active Self, Pharmacy Records  predniSONE (DELTASONE) 10 MG tablet [id number]  Take 0.5 tablets (5 mg total) by mouth daily starting 04/12/2023 Alicia Amel, MD  Active   predniSONE (DELTASONE) 20 MG tablet [id number]  Take 2 tablets (40 mg total) by mouth daily for 3 days. Alicia Amel, MD  Active   RiTUXimab (RITUXAN IV) [id number] No Inject 1,000 mg into the vein See admin instructions. 1,000 mg into the vein every 6 months, then two weeks later another 1,000 mg injection [provider] unknown Active Self, Pharmacy Records           Med Note Comstock,  Bernardo Heater   Fri Apr 13, 2022 10:43 AM) First injection was July 7,2023, second injection was 03/30/22  rivaroxaban (XARELTO) 10 MG TABS tablet [id number] No Take 1 tablet (10 mg total) by mouth daily. McDiarmid, Leighton Roach, MD Past Week Active Self, Pharmacy Records  tiZANidine (ZANAFLEX) 4 MG tablet [id number] No Take 8 mg by mouth at  bedtime. [provider] Past Week Active Self, Pharmacy Records  traMADol (ULTRAM) 50 MG tablet [id number] No TAKE 1 TABLET BY MOUTH EVERY 6 HOURS AS NEEDED. McDiarmid, Leighton Roach, MD Past Month Active Self, Pharmacy Records            Home Care and Equipment/Supplies: Were Home Health Services Ordered?: NA Any new equipment or medical supplies ordered?: NA  Functional Questionnaire: Do you need assistance with bathing/showering or dressing?: No Do you need assistance with meal preparation?: No Do you need assistance with eating?: No Do you have difficulty maintaining continence: No Do you need assistance with getting out of bed/getting out of a chair/moving?: No Do you have difficulty managing or taking your medications?: No  Follow up appointments reviewed: PCP Follow-up appointment confirmed?: Yes Date of PCP follow-up appointment?: 04/12/23 Follow-up Provider: [facility] Follow-up appointment confirmed?: NA Do you need transportation to your follow-up appointment?: No Do you understand care options if your condition(s) worsen?: Yes-patient verbalized understanding    SIGNATURE Karena Addison, LPN [facility] Nurse Health Advisor Direct Dial [phone]

## 2023-04-12 ENCOUNTER — Encounter: Payer: Self-pay | Admitting: Family Medicine

## 2023-04-12 ENCOUNTER — Ambulatory Visit (INDEPENDENT_AMBULATORY_CARE_PROVIDER_SITE_OTHER): Payer: Medicare Other | Admitting: Family Medicine

## 2023-04-12 VITALS — BP 122/60 | HR 79 | Ht 61.0 in | Wt 170.8 lb

## 2023-04-12 DIAGNOSIS — J189 Pneumonia, unspecified organism: Secondary | ICD-10-CM

## 2023-04-12 DIAGNOSIS — R942 Abnormal results of pulmonary function studies: Secondary | ICD-10-CM | POA: Insufficient documentation

## 2023-04-12 DIAGNOSIS — D649 Anemia, unspecified: Secondary | ICD-10-CM | POA: Diagnosis not present

## 2023-04-12 DIAGNOSIS — R778 Other specified abnormalities of plasma proteins: Secondary | ICD-10-CM

## 2023-04-12 HISTORY — DX: Other specified abnormalities of plasma proteins: R77.8

## 2023-04-12 LAB — IRON,TIBC AND FERRITIN PANEL

## 2023-04-12 LAB — PROTEIN / CREATININE RATIO, URINE

## 2023-04-12 NOTE — Assessment & Plan Note (Addendum)
She is doing well today after having pneumonia. No concerning symptoms. She has completed her antibiotic regimen of Doxycycline and Augmentin. She has been taking 40 mg of prednisone as prescribed, but will restart her regular home dose today.  - Restart Prednisone 5 mg daily - Follow-up if any of your symptoms return

## 2023-04-12 NOTE — Patient Instructions (Signed)
It was great to see you today! Thank you for choosing [facility] for your primary care. Andrea Berry was seen for your hospital follow-up after having pneumonia.  Today we addressed: Completing your antibiotic regimen and returning to your home dose of Prednisone 5 mg daily. \ We will check your blood work and get iron studies today. Discussed potentially repeating your Echo, which was last done 8/23, will hold off to repeat in the future  If you haven't already, sign up for My Chart to have easy access to your labs results, and communication with your primary care physician.  We are checking some labs today. If they are abnormal, I will call you. If they are normal, I will send you a MyChart message (if it is active) or a letter in the mail. If you do not hear about your labs in the next 2 weeks, please call the office.  Thank you for allowing me to participate in your care, [name], DO 04/12/2023, 9:13 AM PGY-1, [facility]

## 2023-04-12 NOTE — Assessment & Plan Note (Signed)
Hgb 10.7 and MCV 71.8 during hospitalization and along with her symptoms of tiredness, we will assess iron studies today. - Iron studies: Fe, TIBC, Ferritin

## 2023-04-12 NOTE — Progress Notes (Signed)
    SUBJECTIVE:   CHIEF COMPLAINT / HPI: Hospital follow-up  Patient presents today for after hospitalization for community acquired pneumonia. Endorses feeling a bit tired, but denies cough, fevers, chills, shortness of breath, or chest pain. Took her last dose of antibiotics this morning and has been taking 40 mg of Prednisone since her hospitalization, normally takes 5 mg of Prednisone daily and will restart her home dose today.  Patient also shared that she started taking a new probiotic.   PERTINENT  PMH / PSH: SLE, ILD, PE, TIA, HTN, anemia, blindness  OBJECTIVE:   BP 122/60   Pulse 79   Ht 5\' 1"  (1.549 m)   Wt 170 lb 12.8 oz (77.5 kg)   SpO2 98%   BMI 32.27 kg/m   General: NAD, awake and alert HEENT: blind in both eyes Cardiovascular: RRR. 3/6 murmur Respiratory: CTAB, normal WOB on RA. No wheezing, crackles, or rhonchi. Abdomen: soft, non-tender, non-distended. Bowel sounds normoactive Extremities: no BLE edema Neuro: A&Ox3. No focal neurological deficits.  ASSESSMENT/PLAN:   CAP (community acquired pneumonia) She is doing well today after having pneumonia. No concerning symptoms. She has completed her antibiotic regimen of Doxycycline and Augmentin. She has been taking 40 mg of prednisone as prescribed, but will restart her regular home dose today.  - Restart Prednisone 5 mg daily - Follow-up if any of your symptoms return  Anemia Hgb 10.7 and MCV 71.8 during hospitalization and along with her symptoms of tiredness, we will assess iron studies today. - Iron studies: Fe, TIBC, Ferritin   [name], DO [place] [facility]

## 2023-04-15 DIAGNOSIS — M533 Sacrococcygeal disorders, not elsewhere classified: Secondary | ICD-10-CM | POA: Insufficient documentation

## 2023-04-17 ENCOUNTER — Ambulatory Visit: Payer: Medicare Other

## 2023-04-18 ENCOUNTER — Telehealth: Payer: Self-pay

## 2023-04-18 NOTE — Telephone Encounter (Signed)
Andrea Berry calls nurse line requesting an extension on work note.   She reports she was supposed to return back on 8/5, however she would like an extension of one week returning on 8/15.  Andrea Berry reports she has been fatigued and nauseated. She reports "she just doesn't feel like doing anything."  She denies any fevers, vomiting, SOB or chest pains.   Advised she may need a visit to extend work note. She asked if a virtual apt would be acceptable.   Will forward to PCP and provider who saw patient.

## 2023-04-19 ENCOUNTER — Encounter: Payer: Self-pay | Admitting: Family Medicine

## 2023-04-19 NOTE — Telephone Encounter (Signed)
I discussed with Dr Fatima Blank.  She will write the work note.

## 2023-04-26 NOTE — Telephone Encounter (Signed)
Letter faxed to [employer] HR department. Fax number [phone]. Sunday Spillers, CMA d

## 2023-04-29 ENCOUNTER — Other Ambulatory Visit: Payer: Self-pay | Admitting: Family Medicine

## 2023-04-29 DIAGNOSIS — K219 Gastro-esophageal reflux disease without esophagitis: Secondary | ICD-10-CM

## 2023-04-30 ENCOUNTER — Ambulatory Visit: Admission: RE | Admit: 2023-04-30 | Payer: Medicare Other | Source: Ambulatory Visit

## 2023-04-30 DIAGNOSIS — Z1231 Encounter for screening mammogram for malignant neoplasm of breast: Secondary | ICD-10-CM

## 2023-05-06 ENCOUNTER — Telehealth: Payer: Self-pay

## 2023-05-06 ENCOUNTER — Encounter (HOSPITAL_BASED_OUTPATIENT_CLINIC_OR_DEPARTMENT_OTHER): Payer: Self-pay | Admitting: Emergency Medicine

## 2023-05-06 ENCOUNTER — Emergency Department (HOSPITAL_BASED_OUTPATIENT_CLINIC_OR_DEPARTMENT_OTHER): Payer: Medicare Other | Admitting: Radiology

## 2023-05-06 ENCOUNTER — Other Ambulatory Visit: Payer: Self-pay

## 2023-05-06 ENCOUNTER — Emergency Department (HOSPITAL_BASED_OUTPATIENT_CLINIC_OR_DEPARTMENT_OTHER)
Admission: EM | Admit: 2023-05-06 | Discharge: 2023-05-06 | Disposition: A | Discharge status: Home / Self Care | Payer: Medicare Other | Attending: Emergency Medicine | Admitting: Emergency Medicine

## 2023-05-06 ENCOUNTER — Emergency Department (HOSPITAL_BASED_OUTPATIENT_CLINIC_OR_DEPARTMENT_OTHER): Payer: Medicare Other

## 2023-05-06 DIAGNOSIS — Z1152 Encounter for screening for COVID-19: Secondary | ICD-10-CM | POA: Insufficient documentation

## 2023-05-06 DIAGNOSIS — Z79899 Other long term (current) drug therapy: Secondary | ICD-10-CM | POA: Diagnosis not present

## 2023-05-06 DIAGNOSIS — J181 Lobar pneumonia, unspecified organism: Secondary | ICD-10-CM | POA: Diagnosis not present

## 2023-05-06 DIAGNOSIS — J189 Pneumonia, unspecified organism: Secondary | ICD-10-CM

## 2023-05-06 DIAGNOSIS — Z7901 Long term (current) use of anticoagulants: Secondary | ICD-10-CM | POA: Insufficient documentation

## 2023-05-06 DIAGNOSIS — R42 Dizziness and giddiness: Secondary | ICD-10-CM | POA: Insufficient documentation

## 2023-05-06 DIAGNOSIS — I10 Essential (primary) hypertension: Secondary | ICD-10-CM | POA: Insufficient documentation

## 2023-05-06 DIAGNOSIS — R519 Headache, unspecified: Secondary | ICD-10-CM | POA: Diagnosis present

## 2023-05-06 DIAGNOSIS — Z8673 Personal history of transient ischemic attack (TIA), and cerebral infarction without residual deficits: Secondary | ICD-10-CM | POA: Diagnosis not present

## 2023-05-06 DIAGNOSIS — W1830XA Fall on same level, unspecified, initial encounter: Secondary | ICD-10-CM | POA: Insufficient documentation

## 2023-05-06 DIAGNOSIS — M542 Cervicalgia: Secondary | ICD-10-CM | POA: Insufficient documentation

## 2023-05-06 LAB — CBC WITH DIFFERENTIAL/PLATELET
Abs Immature Granulocytes: 0.17 10*3/uL — ABNORMAL HIGH (ref 0.00–0.07)
Basophils Absolute: 0 10*3/uL (ref 0.0–0.1)
Basophils Relative: 0 %
Eosinophils Absolute: 0 10*3/uL (ref 0.0–0.5)
Eosinophils Relative: 1 %
HCT: 34.5 % — ABNORMAL LOW (ref 36.0–46.0)
Hemoglobin: 10.5 g/dL — ABNORMAL LOW (ref 12.0–15.0)
Immature Granulocytes: 2 %
Lymphocytes Relative: 13 %
Lymphs Abs: 1 10*3/uL (ref 0.7–4.0)
MCH: 21.6 pg — ABNORMAL LOW (ref 26.0–34.0)
MCHC: 30.4 g/dL (ref 30.0–36.0)
MCV: 71 fL — ABNORMAL LOW (ref 80.0–100.0)
Monocytes Absolute: 0.4 10*3/uL (ref 0.1–1.0)
Monocytes Relative: 5 %
Neutro Abs: 5.5 10*3/uL (ref 1.7–7.7)
Neutrophils Relative %: 79 %
Platelets: 407 10*3/uL — ABNORMAL HIGH (ref 150–400)
RBC: 4.86 MIL/uL (ref 3.87–5.11)
RDW: 16 % — ABNORMAL HIGH (ref 11.5–15.5)
WBC: 7.1 10*3/uL (ref 4.0–10.5)
nRBC: 0 % (ref 0.0–0.2)

## 2023-05-06 LAB — BASIC METABOLIC PANEL
Anion gap: 11 (ref 5–15)
BUN: 15 mg/dL (ref 6–20)
CO2: 24 mmol/L (ref 22–32)
Calcium: 9.4 mg/dL (ref 8.9–10.3)
Chloride: 102 mmol/L (ref 98–111)
Creatinine, Ser: 1.33 mg/dL — ABNORMAL HIGH (ref 0.44–1.00)
GFR, Estimated: 47 mL/min — ABNORMAL LOW (ref >60)
Glucose, Bld: 90 mg/dL (ref 70–99)
Potassium: 3.7 mmol/L (ref 3.5–5.1)
Sodium: 137 mmol/L (ref 135–145)

## 2023-05-06 LAB — SARS CORONAVIRUS 2 BY RT PCR: SARS Coronavirus 2 by RT PCR: NEGATIVE

## 2023-05-06 MED ORDER — DOXYCYCLINE HYCLATE 100 MG PO TABS: 100.0000 mg | ORAL_TABLET | Freq: Once | ORAL | Status: DC

## 2023-05-06 MED ORDER — DOXYCYCLINE HYCLATE 100 MG PO CAPS: 100.0000 mg | ORAL_CAPSULE | Freq: Two times a day (BID) | ORAL | 0 refills | Status: DC

## 2023-05-06 NOTE — Telephone Encounter (Signed)
Patient's caregiver, April, calls nurse line regarding concerns with patient. April reports that patient has been having dizzy spells which resulted in a fall on Friday evening. She did hit her head, has a bump now. Unsure of LOC.  Also reports injury to buttocks.   Reports that she is now experiencing a lot of nausea, no vomiting.   She is on Xarelto.   Immediately after fall she did not know where she was. Things started to "come back" shortly after fall. Caregiver denies any new neurological changes.   Spoke with Dr. Jennette Kettle regarding patient. Recommended ED evaluation. Called patient directly. Advised of provider recommendation. She states that she will go to the ED.   Veronda Prude, RN

## 2023-05-06 NOTE — ED Provider Notes (Signed)
[place] EMERGENCY DEPARTMENT AT [facility] Provider Note   CSN: [id number] Arrival date & time: 05/06/23  1400     History  Chief Complaint  Patient presents with   Andrea Berry is a 57 y.o. female.  Patient with history of anticoagulopathy secondary to PE, legally blind, HTN, lupus, GERD, CVA, cardiomyopathy, presents for evaluation of persistent posterior headache and neck pain after fall 3 days ago. She reports onset dizziness while walking, brief LOC and waking on the floor with her son present. She denies chest pain or headache prior to the fall. She has experienced nausea since the fall without vomiting. No other perceived injury.   The history is provided by the patient. No language interpreter was used.  Fall       Home Medications Prior to Admission medications   Medication Sig Start Date End Date Taking? Authorizing Provider  alendronate (FOSAMAX) 70 MG tablet Take 1 tablet (70 mg total) by mouth every Friday. 12/14/22   McDiarmid, Leighton Roach, MD  ALTAMIST SPRAY 0.65 % nasal spray Place 2 sprays into the nose 2 (two) times daily. 10/08/22   [provider]  atorvastatin (LIPITOR) 40 MG tablet Take 40 mg by mouth daily. 01/19/22   [provider]  Cholecalciferol (VITAMIN D3) 50 MCG (2000 UT) TABS Take 2 tablets by mouth daily.    [provider]  diltiazem (CARDIZEM CD) 180 MG 24 hr capsule TAKE 1 CAPSULE BY MOUTH EVERY DAY 02/21/23   McDiarmid, Leighton Roach, MD  doxycycline (VIBRAMYCIN) 100 MG capsule Take 1 capsule (100 mg total) by mouth 2 (two) times daily. 05/06/23  Yes Adriyana Greenbaum, Melvenia Beam, PA-C  DULoxetine (CYMBALTA) 60 MG capsule TAKE 1 CAPSULE BY MOUTH EVERY DAY Patient taking differently: Take 60 mg by mouth daily. 03/29/22   McDiarmid, Leighton Roach, MD  esomeprazole (NEXIUM) 20 MG capsule TAKE 1 CAPSULE (20 MG TOTAL) BY MOUTH DAILY AT 12 NOON. 05/02/23   McDiarmid, Leighton Roach, MD  gabapentin (NEURONTIN) 300 MG capsule Take 2 capsules (600  mg total) by mouth 2 (two) times daily. 04/08/23   Alicia Amel, MD  LORazepam (ATIVAN) 0.5 MG tablet TAKE 1 TABLET BY MOUTH TWICE A DAY 03/05/23   McDiarmid, Leighton Roach, MD  Oxcarbazepine (TRILEPTAL) 300 MG tablet Take 300 mg by mouth daily.    [provider]  predniSONE (DELTASONE) 10 MG tablet Take 0.5 tablets (5 mg total) by mouth daily starting 04/12/2023 04/12/23   Alicia Amel, MD  RiTUXimab (RITUXAN IV) Inject 1,000 mg into the vein See admin instructions. 1,000 mg into the vein every 6 months, then two weeks later another 1,000 mg injection    [provider]  rivaroxaban (XARELTO) 10 MG TABS tablet Take 1 tablet (10 mg total) by mouth daily. 08/10/22 08/05/23  McDiarmid, Leighton Roach, MD  tiZANidine (ZANAFLEX) 4 MG tablet Take 8 mg by mouth at bedtime. 02/08/22   [provider]  traMADol (ULTRAM) 50 MG tablet TAKE 1 TABLET BY MOUTH EVERY 6 HOURS AS NEEDED. 03/05/23   McDiarmid, Leighton Roach, MD      Allergies    Beta adrenergic blockers and Rocephin [ceftriaxone]    Review of Systems   Review of Systems  Physical Exam Updated Vital Signs BP (!) 129/57   Pulse 90   Temp 98.5 F (36.9 C) (Oral)   Resp 18   Ht 5\' 1"  (1.549 m)   Wt 78.5 kg   SpO2 98%  BMI 32.69 kg/m  Physical Exam Vitals and nursing note reviewed.  Constitutional:      General: She is not in acute distress. HENT:     Head: Normocephalic and atraumatic.     Nose: Nose normal.     Mouth/Throat:     Mouth: Mucous membranes are moist.  Neck:     Vascular: No carotid bruit.     Comments: There is no midline cervical tenderness. There is tenderness to right paracervical neck. No swelling. Cardiovascular:     Rate and Rhythm: Normal rate.     Heart sounds: Murmur heard.  Pulmonary:     Effort: Pulmonary effort is normal.     Breath sounds: Normal breath sounds.  Abdominal:     Palpations: Abdomen is soft.     Tenderness: There is no abdominal tenderness.  Musculoskeletal:     Cervical  back: Normal range of motion.  Skin:    General: Skin is warm and dry.  Neurological:     Mental Status: She is oriented to person, place, and time.     Cranial Nerves: No cranial nerve deficit.     Sensory: No sensory deficit.     Coordination: Coordination normal.     ED Results / Procedures / Treatments   Labs (all labs ordered are listed, but only abnormal results are displayed) Labs Reviewed  BASIC METABOLIC PANEL - Abnormal; Notable for the following components:      Result Value   Creatinine, Ser 1.33 (*)    GFR, Estimated 47 (*)    All other components within normal limits  CBC WITH DIFFERENTIAL/PLATELET - Abnormal; Notable for the following components:   Hemoglobin 10.5 (*)    HCT 34.5 (*)    MCV 71.0 (*)    MCH 21.6 (*)    RDW 16.0 (*)    Platelets 407 (*)    Abs Immature Granulocytes 0.17 (*)    All other components within normal limits  SARS CORONAVIRUS 2 BY RT PCR    EKG None  Radiology DG Chest 2 View  Result Date: 05/06/2023 CLINICAL DATA:  abnormal CT - ?Bil UL opacities/PNA EXAM: CHEST - 2 VIEW COMPARISON:  CXR 04/07/23 FINDINGS: Possible small bilateral pleural effusions. Compared to prior exam there are increased bibasilar hazy airspace opacities that could represent atelectasis or infection. Possible age indeterminate fracture of the left lateral 11th rib. Vertebral body heights are maintained. IMPRESSION: 1. Compared to prior exam there are increased bibasilar hazy airspace opacities that could represent atelectasis or infection. 2. Possible age indeterminate fracture of the left lateral 11th rib. Electronically Signed   By: Lorenza Cambridge M.D.   On: 05/06/2023 17:39   CT Cervical Spine Wo Contrast  Result Date: 05/06/2023 CLINICAL DATA:  Neck trauma, dangerous injury mechanism (Age 51-64y) EXAM: CT CERVICAL SPINE WITHOUT CONTRAST TECHNIQUE: Multidetector CT imaging of the cervical spine was performed without intravenous contrast. Multiplanar CT image  reconstructions were also generated. RADIATION DOSE REDUCTION: This exam was performed according to the departmental dose-optimization program which includes automated exposure control, adjustment of the mA and/or kV according to patient size and/or use of iterative reconstruction technique. COMPARISON:  04/12/2022 FINDINGS: Alignment: Facet joints are aligned without dislocation or traumatic listhesis. Dens and lateral masses are aligned. Skull base and vertebrae: No acute fracture. No primary bone lesion or focal pathologic process. Soft tissues and spinal canal: No prevertebral fluid or swelling. No visible canal hematoma. Disc levels: Disc heights are preserved. Facet arthropathy is most  severe on the right at C2-C3 and on the left at C3-C4. Upper chest: Patchy opacity within the upper lung zones bilaterally, more pronounced on the left. Other: None. IMPRESSION: 1. No acute fracture or traumatic listhesis of the cervical spine. 2. Patchy opacity within the upper lung zones bilaterally, more pronounced on the left. Findings are concerning for multifocal pneumonia. Dedicated chest radiographs are recommended. Electronically Signed   By: Duanne Guess D.O.   On: 05/06/2023 15:17   CT Head Wo Contrast  Result Date: 05/06/2023 CLINICAL DATA:  Head trauma, moderate-severe.  Dizziness.  Headache EXAM: CT HEAD WITHOUT CONTRAST TECHNIQUE: Contiguous axial images were obtained from the base of the skull through the vertex without intravenous contrast. RADIATION DOSE REDUCTION: This exam was performed according to the departmental dose-optimization program which includes automated exposure control, adjustment of the mA and/or kV according to patient size and/or use of iterative reconstruction technique. COMPARISON:  04/12/2022 FINDINGS: Brain: No evidence of acute infarction, hemorrhage, hydrocephalus, extra-axial collection or mass lesion/mass effect. Vascular: No hyperdense vessel or unexpected calcification.  Skull: Normal. Negative for fracture or focal lesion. Sinuses/Orbits: No acute finding. Other: Small posterior high right parietal scalp hematoma. IMPRESSION: 1. No acute intracranial abnormality. 2. Small posterior high right parietal scalp hematoma. No underlying calvarial fracture. Electronically Signed   By: Duanne Guess D.O.   On: 05/06/2023 15:10    Procedures Procedures    Medications Ordered in ED Medications  doxycycline (VIBRA-TABS) tablet 100 mg (has no administration in time range)    ED Course/ Medical Decision Making/ A&P Clinical Course as of 05/06/23 1800  Mon May 06, 2023  1530 Seen for evaluation of dizziness leading to fall with brief LOC 3 days ago, headache and nausea since without vomiting. On anticoagulation with history of PE. CT head and neck completed, pending interpretation. Patient declines need for comfort medications for now.  [SU]  1754 CXR reviewed and compared with CXR 04/07/23. There is a concern for persistent RLL infiltrate that may be slightly increased. Reviewed with Dr. Earlene Plater who agrees with interpretation. C/W radiology read as well. She is well appearing, no leukocytosis, normal oxygenation, afebrile. Will cover with Doxycycline and strongly encourage PCP recheck this week to insure improvement.  [SU]    Clinical Course User Index [SU] Elpidio Anis, PA-C                                 Medical Decision Making Amount and/or Complexity of Data Reviewed Labs: ordered. Radiology: ordered.           Final Clinical Impression(s) / ED Diagnoses Final diagnoses:  Acute nonintractable headache, unspecified headache type  Community acquired pneumonia of right lower lobe of lung    Rx / DC Orders ED Discharge Orders          Ordered    doxycycline (VIBRAMYCIN) 100 MG capsule  2 times daily        05/06/23 1759              Elpidio Anis, PA-C 05/06/23 1801    Laurence Spates, MD 05/07/23 1440

## 2023-05-06 NOTE — Discharge Instructions (Signed)
Follow up with your doctor this week for recheck of your headache as well as your persistent pneumonia. Take Doxycycline twice daily for 10 days. Return to the ED with any new or worsening symptoms.

## 2023-05-06 NOTE — ED Triage Notes (Signed)
Pt arrives to ED with c/o fall x3 days ago. She report she was dizzy prior to the fall. Pt notes hitting her head and is unsure of LOC. Pt reports on-going nausea and headache post fall. Pt is on blood thinners.

## 2023-05-06 NOTE — ED Notes (Signed)
Pt given discharge instructions and reviewed prescriptions. Opportunities given for questions. Pt verbalizes understanding. PIV removed x1. Stone,Heather R, RN 

## 2023-05-07 ENCOUNTER — Encounter: Payer: Self-pay | Admitting: Family Medicine

## 2023-05-08 ENCOUNTER — Other Ambulatory Visit: Payer: Self-pay | Admitting: Family Medicine

## 2023-05-08 DIAGNOSIS — G8929 Other chronic pain: Secondary | ICD-10-CM

## 2023-05-08 DIAGNOSIS — F418 Other specified anxiety disorders: Secondary | ICD-10-CM

## 2023-05-08 DIAGNOSIS — G894 Chronic pain syndrome: Secondary | ICD-10-CM

## 2023-05-08 DIAGNOSIS — F419 Anxiety disorder, unspecified: Secondary | ICD-10-CM

## 2023-05-08 DIAGNOSIS — E2749 Other adrenocortical insufficiency: Secondary | ICD-10-CM

## 2023-05-08 NOTE — Telephone Encounter (Signed)
Reviewed.  Patient seen in ED and diagnosed with pneumonia. Head CT without bleed.

## 2023-05-10 ENCOUNTER — Ambulatory Visit (INDEPENDENT_AMBULATORY_CARE_PROVIDER_SITE_OTHER): Payer: Medicare Other | Admitting: Family Medicine

## 2023-05-10 ENCOUNTER — Encounter: Payer: Self-pay | Admitting: Family Medicine

## 2023-05-10 VITALS — BP 128/63 | HR 97 | Ht 61.0 in | Wt 170.0 lb

## 2023-05-10 DIAGNOSIS — J189 Pneumonia, unspecified organism: Secondary | ICD-10-CM

## 2023-05-10 DIAGNOSIS — W19XXXA Unspecified fall, initial encounter: Secondary | ICD-10-CM

## 2023-05-10 HISTORY — DX: Unspecified fall, initial encounter: W19.XXXA

## 2023-05-10 NOTE — Patient Instructions (Addendum)
It was wonderful to see you today!  Today we followed up on your recent hospital stay and visit to the emergency department. I am glad that your head is feeling better. If you have any new headaches or you feel unsteady on your feet again, please give [place] a call and schedule an appointment  We also discussed your ongoing shortness of breath. It is not uncommon for recovery from pneumonia to take several weeks. Please complete your course of doxycycline, and follow up with Dr. McDiarmid in one week.    Please call [phone] with any questions about today's appointment.   If you need any additional refills, please call your pharmacy before calling the office.  Gerrit Heck, DO Family Medicine

## 2023-05-10 NOTE — Telephone Encounter (Signed)
Andrea Berry should obtain her prednisone from her endocrinologist, Dr Ulla Potash, [facility]

## 2023-05-10 NOTE — Progress Notes (Signed)
    SUBJECTIVE:   CHIEF COMPLAINT / HPI:   Seen in the ED for headache after a fall and found to have ongoing/worsening CAP. Treated with doxycycline 100mg  BID for 10 days.  CAP/Infiltrate on CXR: Reviewed CXR from ED visit and compared it to her CXR from 7/28. Basilar opacities appear improved, but lung markings are more coarse than previously observed.  She denies any new fevers or chills, but her cough is still ongoing. She is also still markedly short of breath even with short distances. She does not need to stop to catch her breath, but does feel like she is breathing harder.  She denies any chest pain, orthopnea or palpitations.  Headache: CT head in the ED showed scalp hematoma with no underlying fracture, and no evidence of bleeding. Neck CT showed no acute processes.   Her headache has resolved, and she reports only mild tenderness when she lays on her back so there is pressure on the occiput.   PERTINENT  PMH / PSH: CAP w/ hospitalization 7/28, cardiomegaly, interstitial lung disease  OBJECTIVE:   BP 128/63   Pulse 97   Ht 5\' 1"  (1.549 m)   Wt 170 lb (77.1 kg)   SpO2 99%   BMI 32.12 kg/m   General: A&O, NAD Head and Neck: Tender to palpation over the left side of the neck with associated muscle stiffness. No palpable lymph nodes. Cardiac: RRR, no m/r/g Respiratory: CTAB, normal WOB, no w/c/r Extremities: NTTP, no peripheral edema. Neuro: Normal gait, moves all four extremities appropriately.  ASSESSMENT/PLAN:   CAP (community acquired pneumonia) After discussion with Dr. McDiarmid and the patient, decided to complete course of doxycycline and have her follow up with him in one week. Her shortness of breath is likely related to her pneumonia, but the appearance of her chest xray could point to cardiac involvement. Her appointment is on September 5th at 11am and this was approved by Dr. Perley Jain.   Fall Patient's head pain has resolved, and I was able to observe  her walking. Her gait is stable and she is able to ambulate with a cane and assistance due to being vision impaired. At this time I do not believe she needs any further intervention, but if she has a second fall, further work up would be appropriate.    Gerrit Heck, DO [facility]

## 2023-05-10 NOTE — Assessment & Plan Note (Signed)
After discussion with Dr. McDiarmid and the patient, decided to complete course of doxycycline and have her follow up with him in one week. Her shortness of breath is likely related to her pneumonia, but the appearance of her chest xray could point to cardiac involvement. Her appointment is on September 5th at 11am and this was approved by Dr. Perley Jain.

## 2023-05-10 NOTE — Assessment & Plan Note (Signed)
Patient's head pain has resolved, and I was able to observe her walking. Her gait is stable and she is able to ambulate with a cane and assistance due to being vision impaired. At this time I do not believe she needs any further intervention, but if she has a second fall, further work up would be appropriate.

## 2023-05-14 ENCOUNTER — Other Ambulatory Visit: Payer: Self-pay | Admitting: Family Medicine

## 2023-05-14 DIAGNOSIS — R112 Nausea with vomiting, unspecified: Secondary | ICD-10-CM

## 2023-05-16 ENCOUNTER — Ambulatory Visit: Payer: Medicare Other | Admitting: Family Medicine

## 2023-05-21 ENCOUNTER — Other Ambulatory Visit: Payer: Self-pay | Admitting: Family Medicine

## 2023-05-21 DIAGNOSIS — R112 Nausea with vomiting, unspecified: Secondary | ICD-10-CM

## 2023-05-23 ENCOUNTER — Ambulatory Visit: Payer: Medicare Other | Admitting: Family Medicine

## 2023-05-30 ENCOUNTER — Ambulatory Visit (INDEPENDENT_AMBULATORY_CARE_PROVIDER_SITE_OTHER): Payer: Medicare Other | Admitting: Family Medicine

## 2023-05-30 ENCOUNTER — Encounter: Payer: Self-pay | Admitting: Family Medicine

## 2023-05-30 VITALS — BP 126/68 | HR 92 | Temp 98.4°F | Ht 61.0 in | Wt 174.1 lb

## 2023-05-30 DIAGNOSIS — Z23 Encounter for immunization: Secondary | ICD-10-CM | POA: Diagnosis not present

## 2023-05-30 DIAGNOSIS — R7989 Other specified abnormal findings of blood chemistry: Secondary | ICD-10-CM

## 2023-05-30 DIAGNOSIS — R110 Nausea: Secondary | ICD-10-CM

## 2023-05-30 DIAGNOSIS — R052 Subacute cough: Secondary | ICD-10-CM

## 2023-05-30 DIAGNOSIS — G44309 Post-traumatic headache, unspecified, not intractable: Secondary | ICD-10-CM

## 2023-05-30 DIAGNOSIS — R0609 Other forms of dyspnea: Secondary | ICD-10-CM | POA: Diagnosis not present

## 2023-05-30 DIAGNOSIS — R053 Chronic cough: Secondary | ICD-10-CM

## 2023-05-30 MED ORDER — ONDANSETRON HCL 4 MG PO TABS: 4.0000 mg | ORAL_TABLET | Freq: Three times a day (TID) | ORAL | 3 refills | Status: DC | PRN

## 2023-05-30 MED ORDER — BENZONATATE 100 MG PO CAPS
100.0000 mg | ORAL_CAPSULE | Freq: Three times a day (TID) | ORAL | 1 refills | Status: DC | PRN
Start: 2023-05-30 — End: 2023-10-28

## 2023-05-30 MED ORDER — ALBUTEROL SULFATE HFA 108 (90 BASE) MCG/ACT IN AERS
2.0000 | INHALATION_SPRAY | Freq: Four times a day (QID) | RESPIRATORY_TRACT | 0 refills | Status: DC | PRN
Start: 2023-05-30 — End: 2023-06-19

## 2023-05-30 NOTE — Patient Instructions (Addendum)
Lets get a [place] CAT Scan to see what may be causing your cough.  Take the ondansetron for nausea  Take the benzoate pills for cough

## 2023-05-31 ENCOUNTER — Encounter: Payer: Self-pay | Admitting: Family Medicine

## 2023-05-31 DIAGNOSIS — R519 Headache, unspecified: Secondary | ICD-10-CM

## 2023-05-31 HISTORY — DX: Headache, unspecified: R51.9

## 2023-05-31 NOTE — Assessment & Plan Note (Addendum)
Since the diagnosis of pneumonia on 05/06/23, Andrea Berry has had a productive could. She is bringing up " a lot" of phlegm.  There is no paroxysms nor whoops with cough.  She had temporary relief of the cough at the same time she was taking her Doxy, then the cough returned.  She has a new shortness of breath with wlaking in her home and performing her household chores in her home. She does not use an inhaler as her old one is empty.  Patient is immunosuppressed from Rituximab. She is on rivaroxaban.   Andrea Berry sees a pulmonologist, Dr Candie Echevaria. She was referred by a specilaist in [facility] to Dr Candie Echevaria for concern for ILD uses UIP or inflammatory in 2020  Pulmonologist Dr Candie Echevaria @ [facility]. ILD thought to have autoimmune origin.  She has had multifocal pneumonia back in 2021 found on Chest CT.  She had fibrotic-type changes within the lower lobes on CT 03/27/22.  Resting SaO2 on RA is 100%.  Bilateral crackles lower lung fields posteriorly.  No wheeze.  No acc mm use.  Cor: RRR, no M/G Ext: no leg ankle/leg edema  . Lab Results  Component Value Date   WBC 10.5 05/30/2023   HGB 10.6 (L) 05/30/2023   HCT 37.1 05/30/2023   MCV 75 (L) 05/30/2023   PLT 351 05/30/2023   Lab Results  Component Value Date   NA 142 05/30/2023   K 3.3 (L) 05/30/2023   CO2 20 05/30/2023   GLUCOSE 93 05/30/2023   BUN 13 05/30/2023   CREATININE 0.89 05/30/2023   CALCIUM 9.5 05/30/2023   EGFR 77 05/30/2023   GFRNONAA 47 (L) 05/06/2023   Awit BNP and Procal  A/ Chronic cough - still in range for post-infectious cough, esp in patient with underlying lung disease. There have been some PET scans whose readings are worrisome but the patient said she was told it by [facility] specialist it was nothing to worry about.   She has had bronchiectasis seen on CT in past, this could also contribute to the chronic cough.   Plan Checking BNP, Procal Chest CT without CM to look for other causes of chronic cough.  CCXR would have  difficulty distinguishing ILD changes from other causal condtions.

## 2023-05-31 NOTE — Progress Notes (Signed)
Andrea Berry is alone Sources of clinical information for visit is/are patient and past medical records. Nursing assessment for this office visit was reviewed with the patient for accuracy and revision.     Previous Report(s) Reviewed: ER records and recent [facility] office visit notes. Reviewed CXR from 8/26.     05/30/2023    2:29 PM  Depression screen PHQ 2/9  Decreased Interest 0  Down, Depressed, Hopeless 0  PHQ - 2 Score 0  Altered sleeping 0  Tired, decreased energy 0  Change in appetite 0  Feeling bad or failure about yourself  0  Trouble concentrating 0  Moving slowly or fidgety/restless 0  Suicidal thoughts 0  PHQ-9 Score 0   Flowsheet Row Office Visit from 05/30/2023 in [facility] Office Visit from 05/10/2023 in [facility] Office Visit from 04/12/2023 in [facility]  Thoughts that you would be better off dead, or of hurting yourself in some way Not at all Not at all Not at all  PHQ-9 Total Score 0 4 2          03/29/2023    2:15 PM 05/26/2020    2:39 PM 07/23/2019   11:23 AM 03/27/2019   11:08 AM 07/16/2018   12:21 PM  Fall Risk   Falls in the past year? 0 0 1 0 0  Number falls in past yr: 0 0 0 0   Injury with Fall? 0  0    Risk for fall due to : Impaired mobility Impaired vision   Impaired vision;Medication side effect;Impaired mobility  Follow up Falls prevention discussed;Education provided;Falls evaluation completed           05/30/2023    2:29 PM 05/10/2023    3:52 PM 04/12/2023    8:45 AM  PHQ9 SCORE ONLY  PHQ-9 Total Score 0 4 2    There are no preventive care reminders to display for this patient.  Health Maintenance Due  Topic Date Due   COVID-19 Vaccine (6 - 2023-24 season) 05/12/2023      History/P.E. limitations: none  There are no preventive care reminders to display for this patient. There are no preventive care reminders to display for this patient.  Health Maintenance  Due  Topic Date Due   COVID-19 Vaccine (6 - 2023-24 season) 05/12/2023     Chief Complaint  Patient presents with   Cough     --------------------------------------------------------------------------------------------------------------------------------------------- Visit Problem List with A/P  Headache Since syncope with fall with resulting trauma to right side of back o headarea, Andrea Berry has been having frequent frontal mild-to-moderate headaches along with nausea. Head CT the day of the fall with head trauma showed only a right parietal scalp hematoma.  Andrea Berry memory of the events immediately before the syncopal event and fall are poor.   She has been taking tylenol with adequate relief, but recurrence.   A working explanation is that Andrea Berry sustained a concussion and is experiencing a post-concussive headache with migrainous qualities.  For now will continue abortive therapies, but if persists, she may need a prophylactic med.   Chronic cough Since the diagnosis of pneumonia on 05/06/23, Andrea Berry has had a productive could. She is bringing up " a lot" of phlegm.  There is no paroxysms nor whoops with cough.  She had temporary relief of the cough at the same time she was taking her Doxy, then the cough returned.  She has a new  shortness of breath with wlaking in her home and performing her household chores in her home. She does not use an inhaler as her old one is empty.  Patient is immunosuppressed from Rituximab. She is on rivaroxaban.   Andrea Berry sees a pulmonologist, Dr Candie Echevaria. She was referred by a specilaist in [facility] to Dr Candie Echevaria for concern for ILD uses UIP or inflammatory in 2020  Pulmonologist Dr Candie Echevaria @ [facility]. ILD thought to have autoimmune origin.  She has had multifocal pneumonia back in 2021 found on Chest CT.  She had fibrotic-type changes within the lower lobes on CT 03/27/22.  Resting SaO2 on RA is 100%.  Bilateral crackles lower lung fields posteriorly.  No  wheeze.  No acc mm use.  Cor: RRR, no M/G Ext: no leg ankle/leg edema  . Lab Results  Component Value Date   WBC 10.5 05/30/2023   HGB 10.6 (L) 05/30/2023   HCT 37.1 05/30/2023   MCV 75 (L) 05/30/2023   PLT 351 05/30/2023   Lab Results  Component Value Date   NA 142 05/30/2023   K 3.3 (L) 05/30/2023   CO2 20 05/30/2023   GLUCOSE 93 05/30/2023   BUN 13 05/30/2023   CREATININE 0.89 05/30/2023   CALCIUM 9.5 05/30/2023   EGFR 77 05/30/2023   GFRNONAA 47 (L) 05/06/2023   Awit BNP and Procal  A/ Chronic cough - still in range for post-infectious cough, esp in patient with underlying lung disease. There have been some PET scans whose readings are worrisome but the patient said she was told it by [facility] specialist it was nothing to worry about.   She has had bronchiectasis seen on CT in past, this could also contribute to the chronic cough.   Plan Checking BNP, Procal Chest CT without CM to look for other causes of chronic cough.  CCXR would have difficulty distinguishing ILD changes from other causal condtions.

## 2023-05-31 NOTE — Assessment & Plan Note (Signed)
Since syncope with fall with resulting trauma to right side of back o headarea, Andrea Berry has been having frequent frontal mild-to-moderate headaches along with nausea. Head CT the day of the fall with head trauma showed only a right parietal scalp hematoma.  Andrea Berry memory of the events immediately before the syncopal event and fall are poor.   She has been taking tylenol with adequate relief, but recurrence.   A working explanation is that Andrea Berry sustained a concussion and is experiencing a post-concussive headache with migrainous qualities.  For now will continue abortive therapies, but if persists, she may need a prophylactic med.

## 2023-06-01 LAB — BASIC METABOLIC PANEL
BUN/Creatinine Ratio: 15 (ref 9–23)
BUN: 13 mg/dL (ref 6–24)
CO2: 20 mmol/L (ref 20–29)
Calcium: 9.5 mg/dL (ref 8.7–10.2)
Chloride: 103 mmol/L (ref 96–106)
Creatinine, Ser: 0.89 mg/dL (ref 0.57–1.00)
Glucose: 93 mg/dL (ref 70–99)
Potassium: 3.3 mmol/L — ABNORMAL LOW (ref 3.5–5.2)
Sodium: 142 mmol/L (ref 134–144)
eGFR: 77 mL/min/{1.73_m2} (ref >59)

## 2023-06-01 LAB — CBC WITH DIFFERENTIAL/PLATELET
Basophils Absolute: 0 10*3/uL (ref 0.0–0.2)
Basos: 0 %
EOS (ABSOLUTE): 0 10*3/uL (ref 0.0–0.4)
Eos: 0 %
Hematocrit: 37.1 % (ref 34.0–46.6)
Hemoglobin: 10.6 g/dL — ABNORMAL LOW (ref 11.1–15.9)
Immature Grans (Abs): 0.1 10*3/uL (ref 0.0–0.1)
Immature Granulocytes: 1 %
Lymphocytes Absolute: 0.8 10*3/uL (ref 0.7–3.1)
Lymphs: 7 %
MCH: 21.4 pg — ABNORMAL LOW (ref 26.6–33.0)
MCHC: 28.6 g/dL — ABNORMAL LOW (ref 31.5–35.7)
MCV: 75 fL — ABNORMAL LOW (ref 79–97)
Monocytes Absolute: 0.6 10*3/uL (ref 0.1–0.9)
Monocytes: 6 %
Neutrophils Absolute: 9 10*3/uL — ABNORMAL HIGH (ref 1.4–7.0)
Neutrophils: 86 %
Platelets: 351 10*3/uL (ref 150–450)
RBC: 4.95 x10E6/uL (ref 3.77–5.28)
RDW: 15.9 % — ABNORMAL HIGH (ref 11.7–15.4)
WBC: 10.5 10*3/uL (ref 3.4–10.8)

## 2023-06-01 LAB — PROCALCITONIN: Procalcitonin: 0.08 ng/mL (ref 0.00–0.08)

## 2023-06-01 LAB — BRAIN NATRIURETIC PEPTIDE: BNP: 113.7 pg/mL — ABNORMAL HIGH (ref 0.0–100.0)

## 2023-06-04 DIAGNOSIS — R768 Other specified abnormal immunological findings in serum: Secondary | ICD-10-CM | POA: Insufficient documentation

## 2023-06-06 ENCOUNTER — Ambulatory Visit (HOSPITAL_COMMUNITY)
Admission: RE | Admit: 2023-06-06 | Discharge: 2023-06-06 | Disposition: A | Discharge status: Home / Self Care | Payer: Medicare Other | Source: Ambulatory Visit | Attending: Family Medicine | Admitting: Family Medicine

## 2023-06-06 DIAGNOSIS — R053 Chronic cough: Secondary | ICD-10-CM | POA: Diagnosis present

## 2023-06-12 ENCOUNTER — Telehealth: Payer: Self-pay

## 2023-06-12 DIAGNOSIS — J471 Bronchiectasis with (acute) exacerbation: Secondary | ICD-10-CM

## 2023-06-12 NOTE — Telephone Encounter (Signed)
Attempted to reach patient. No answer. LVM of note left by Dr. Lum Babe and for patient to call the office to have that done. Aquilla Solian, CMA

## 2023-06-12 NOTE — Telephone Encounter (Signed)
Andrea Berry patients niece calls nurse line for several concerns.    (1) She is requesting results from recent CT scan. I advised her the imaging results are not back yet. Advised we are seeing a turn around time around 5-7 days.   (2) She reports the Tessalon cough medication is not working. She reports Narcisa is till coughing up thick "darkish" phlegm. She reports continued loss of appetite and "just not feeling any better."   Denies and fevers or SOB.   Advised PCP is out of the office this week. Advised a FU apt would be appropriate. She reports she prefers to see PCP. However, next available is November.   Will forward to PCP.   Precautions discussed with Andrea Berry.

## 2023-06-15 ENCOUNTER — Other Ambulatory Visit: Payer: Self-pay

## 2023-06-15 ENCOUNTER — Encounter (HOSPITAL_BASED_OUTPATIENT_CLINIC_OR_DEPARTMENT_OTHER): Payer: Self-pay

## 2023-06-15 ENCOUNTER — Emergency Department (HOSPITAL_BASED_OUTPATIENT_CLINIC_OR_DEPARTMENT_OTHER): Payer: Medicare Other | Admitting: Radiology

## 2023-06-15 ENCOUNTER — Emergency Department (HOSPITAL_BASED_OUTPATIENT_CLINIC_OR_DEPARTMENT_OTHER)
Admission: EM | Admit: 2023-06-15 | Discharge: 2023-06-15 | Disposition: A | Discharge status: Home / Self Care | Payer: Medicare Other

## 2023-06-15 DIAGNOSIS — Z7952 Long term (current) use of systemic steroids: Secondary | ICD-10-CM | POA: Diagnosis not present

## 2023-06-15 DIAGNOSIS — Z20822 Contact with and (suspected) exposure to covid-19: Secondary | ICD-10-CM | POA: Diagnosis not present

## 2023-06-15 DIAGNOSIS — Z7951 Long term (current) use of inhaled steroids: Secondary | ICD-10-CM | POA: Diagnosis not present

## 2023-06-15 DIAGNOSIS — J168 Pneumonia due to other specified infectious organisms: Secondary | ICD-10-CM | POA: Diagnosis not present

## 2023-06-15 DIAGNOSIS — Z7901 Long term (current) use of anticoagulants: Secondary | ICD-10-CM | POA: Insufficient documentation

## 2023-06-15 DIAGNOSIS — J189 Pneumonia, unspecified organism: Secondary | ICD-10-CM

## 2023-06-15 DIAGNOSIS — R059 Cough, unspecified: Secondary | ICD-10-CM | POA: Diagnosis present

## 2023-06-15 LAB — RESP PANEL BY RT-PCR (RSV, FLU A&B, COVID)  RVPGX2
Influenza A by PCR: NEGATIVE
Influenza B by PCR: NEGATIVE
Resp Syncytial Virus by PCR: NEGATIVE
SARS Coronavirus 2 by RT PCR: NEGATIVE

## 2023-06-15 MED ORDER — DOXYCYCLINE HYCLATE 100 MG PO CAPS: 100.0000 mg | ORAL_CAPSULE | Freq: Two times a day (BID) | ORAL | 0 refills | Status: AC

## 2023-06-15 NOTE — ED Provider Notes (Signed)
Andrea Berry   CSN: [id number] Arrival date & time: 06/15/23  1019     History  No chief complaint on file.   Andrea Berry is a 57 y.o. female.  57 year old female present emergency department with chief complaint of cough, shortness of breath, chills malaise, intermittent nausea no vomiting.  Reports recently been treated for pneumonia over the past month        Home Medications Prior to Admission medications   Medication Sig Start Date End Date Taking? Authorizing Provider  albuterol (VENTOLIN HFA) 108 (90 Base) MCG/ACT inhaler Inhale 2 puffs into the lungs every 6 (six) hours as needed for wheezing or shortness of breath (coughing). 05/30/23   McDiarmid, Leighton Roach, MD  alendronate (FOSAMAX) 70 MG tablet Take 1 tablet (70 mg total) by mouth every Friday. 12/14/22   McDiarmid, Leighton Roach, MD  ALTAMIST SPRAY 0.65 % nasal spray Place 2 sprays into the nose 2 (two) times daily. 10/08/22   [provider]  atorvastatin (LIPITOR) 40 MG tablet Take 40 mg by mouth daily. 01/19/22   [provider]  benzonatate (TESSALON) 100 MG capsule Take 1 capsule (100 mg total) by mouth 3 (three) times daily as needed for cough. 05/30/23   McDiarmid, Leighton Roach, MD  Cholecalciferol (VITAMIN D3) 50 MCG (2000 UT) TABS Take 2 tablets by mouth daily.    [provider]  diltiazem (CARDIZEM CD) 180 MG 24 hr capsule TAKE 1 CAPSULE BY MOUTH EVERY DAY 02/21/23   McDiarmid, Leighton Roach, MD  doxycycline (VIBRAMYCIN) 100 MG capsule Take 1 capsule (100 mg total) by mouth 2 (two) times daily. 05/06/23   Elpidio Anis, PA-C  DULoxetine (CYMBALTA) 60 MG capsule TAKE 1 CAPSULE BY MOUTH EVERY DAY 05/10/23   McDiarmid, Leighton Roach, MD  esomeprazole (NEXIUM) 20 MG capsule TAKE 1 CAPSULE (20 MG TOTAL) BY MOUTH DAILY AT 12 NOON. 05/02/23   McDiarmid, Leighton Roach, MD  gabapentin (NEURONTIN) 300 MG capsule Take 2 capsules (600 mg total) by mouth 2 (two) times daily.  04/08/23   Alicia Amel, MD  LORazepam (ATIVAN) 0.5 MG tablet TAKE 1 TABLET BY MOUTH TWICE A DAY 03/05/23   McDiarmid, Leighton Roach, MD  ondansetron (ZOFRAN) 4 MG tablet Take 1 tablet (4 mg total) by mouth every 8 (eight) hours as needed for nausea or vomiting. 05/30/23   McDiarmid, Leighton Roach, MD  Oxcarbazepine (TRILEPTAL) 300 MG tablet Take 300 mg by mouth daily.    [provider]  predniSONE (DELTASONE) 10 MG tablet Take 0.5 tablets (5 mg total) by mouth daily starting 04/12/2023 04/12/23   Alicia Amel, MD  RiTUXimab (RITUXAN IV) Inject 1,000 mg into the vein See admin instructions. 1,000 mg into the vein every 6 months, then two weeks later another 1,000 mg injection    [provider]  rivaroxaban (XARELTO) 10 MG TABS tablet Take 1 tablet (10 mg total) by mouth daily. 08/10/22 08/05/23  McDiarmid, Leighton Roach, MD  tiZANidine (ZANAFLEX) 4 MG tablet Take 8 mg by mouth at bedtime. 02/08/22   [provider]  traMADol (ULTRAM) 50 MG tablet TAKE 1 TABLET BY MOUTH EVERY 6 HOURS AS NEEDED. 03/05/23   McDiarmid, Leighton Roach, MD      Allergies    Beta adrenergic blockers and Rocephin [ceftriaxone]    Review of Systems   Review of Systems  Physical Exam Updated Vital Signs BP 111/61 (BP Location: Left Arm)   Pulse 86  Temp 98.4 F (36.9 C) (Oral)   Resp 19   Ht 5\' 1"  (1.549 m)   Wt 75.3 kg   SpO2 96%   BMI 31.37 kg/m  Physical Exam Vitals and nursing Berry reviewed.  Constitutional:      General: She is not in acute distress.    Appearance: She is not toxic-appearing.  HENT:     Head: Normocephalic.     Nose: Nose normal.     Mouth/Throat:     Mouth: Mucous membranes are moist.  Cardiovascular:     Rate and Rhythm: Normal rate.     Pulses: Normal pulses.  Pulmonary:     Effort: Pulmonary effort is normal.     Breath sounds: Normal breath sounds.  Abdominal:     General: Abdomen is flat. There is no distension.     Palpations: Abdomen is soft.     Tenderness: There is  no abdominal tenderness. There is no guarding or rebound.  Musculoskeletal:     Cervical back: Normal range of motion.     Right lower leg: No edema.     Left lower leg: No edema.  Neurological:     General: No focal deficit present.     Mental Status: She is alert.  Psychiatric:        Mood and Affect: Mood normal.        Behavior: Behavior normal.     ED Results / Procedures / Treatments   Labs (all labs ordered are listed, but only abnormal results are displayed) Labs Reviewed  RESP PANEL BY RT-PCR (RSV, FLU A&B, COVID)  [id number]    EKG EKG Interpretation Date/Time:  Saturday June 15 2023 10:36:27 EDT Ventricular Rate:  85 PR Interval:  134 QRS Duration:  84 QT Interval:  368 QTC Calculation: 437 R Axis:   61  Text Interpretation: Normal sinus rhythm Possible Left atrial enlargement Left ventricular hypertrophy with repolarization abnormality ( Sokolow-Lyon ) Abnormal ECG When compared with ECG of 08-Apr-2023 06:58, No significant change was found Confirmed by Estanislado Pandy [phone]) on 06/15/2023 12:37:35 PM  Radiology DG Chest 2 View  Result Date: 06/15/2023 CLINICAL DATA:  Cough and fever. EXAM: CHEST - 2 VIEW COMPARISON:  06/06/2023 FINDINGS: Midline trachea. Moderate cardiomegaly. No pleural effusion or pneumothorax. Minimally improved right base aeration. Persistent bibasilar airspace disease primarily medially. IMPRESSION: Persistent or recurrent medial bilateral lower lobe airspace disease which could represent atelectasis or infection. Cardiomegaly without congestive failure. Electronically Signed   By: Jeronimo Greaves M.D.   On: 06/15/2023 12:22    Procedures Procedures    Medications Ordered in ED Medications - No data to display  ED Course/ Medical Decision Making/ A&P Clinical Course as of 06/15/23 1325  Sat Jun 15, 2023  1203 Seen by pulmonology on 10/3 per their Berry "Assessment/Plan:   1. Secondary adrenal insufficiency (HCC)  2. Mild obstructive sleep  apnea  3. ILD (interstitial lung disease) (HCC)  4. Lung nodule  5. High risk medication use  #1 interstitial lung disease in the setting of Devic's syndrome and lupus as well as the presence of mediastinal adenopathy in the setting of MGUS. Patient has undergone bronchoscopic evaluation which has been nondiagnostic or negative. In addition IR guided procedure was withheld by IR due to interval improvement of previous hypermetabolic nodule supportive of infectious etiology. Culture results by bronchoscopy in 2022 was previously negative. I have asked for the outside CT images for review given the lack of radiology report this may  not be available as such we will have to consider repeating the CT. If the above is suggestive a repeat bronchoscopy may be required.  Return in about 6 months (around 12/12/2023).  Keep up to date on adult immunizations, especially influenza and pneumonia. Thank you for the opportunity to provide consultation for your patient. Please Berry that portions of this document are generated using dictation. For any necessary clarifications or if I can be of further assistance, please do not hesitate to contact my office. " [TY]  1237 DG Chest 2 View IMPRESSION: Persistent or recurrent medial bilateral lower lobe airspace disease which could represent atelectasis or infection.  Cardiomegaly without congestive failure.   [TY]    Clinical Course User Index [TY] Coral Spikes, DO                                 Medical Decision Making Well-appearing 57 year old presenting emergency department with viral syndrome type symptoms.  Afebrile vital signs reassuring.  Maintaining oxygen saturation on room air.  Clear lungs on exam.  Concern for possible viral etiology therefore ordered flu/COVID/RSV which was negative.  Chest x-ray given cough, shortness of breath and recent treatment for pneumonia.  Chest x-ray with atelectasis versus opacification in bilateral lung bases per  radiology report concerning for possible pneumonia.  Shared decision making regarding treatment with antibiotics versus watchful waiting.  Patient would like antibiotics.  Feel this is not unreasonable given recently treated for pneumonia.  Amount and/or Complexity of Data Reviewed External Data Reviewed:     Details: Recently seen by pulmonologist see ED course Labs:     Details: Consider labs, however stable vitals and physical exam.  Labs unlikely to change management or disposition at this time Radiology: ordered. Decision-making details documented in ED Course.          Final Clinical Impression(s) / ED Diagnoses Final diagnoses:  None    Rx / DC Orders ED Discharge Orders     None         Coral Spikes, DO 06/15/23 1325

## 2023-06-15 NOTE — Discharge Instructions (Signed)
Please take antibiotics as prescribed, follow-up with your primary doctor and your pulmonologist.  Return immediately develop fevers, chills, increasing shortness of breath, inability eat or drink or you develop any new or worsening symptoms that are concerning to you.

## 2023-06-15 NOTE — ED Triage Notes (Signed)
Patient presents to the ED with productive cough that started one week ago +subjective fever. Denies chest pain, shortness of breat.

## 2023-06-17 ENCOUNTER — Encounter: Payer: Self-pay | Admitting: Family Medicine

## 2023-06-17 MED ORDER — DOXYCYCLINE HYCLATE 100 MG PO TABS
100.0000 mg | ORAL_TABLET | Freq: Two times a day (BID) | ORAL | 0 refills | Status: DC
Start: 2023-06-17 — End: 2023-07-31

## 2023-06-17 NOTE — Telephone Encounter (Signed)
I spoke with Andrea Berry about her recent 06/15/23 visit to the ED with CXR, and about her outpatient Chest CT on 06/06/23 (which has not been read by radiology. Negative respiratory panel.   {Patient diagnosed with pneumonia and treated with Doxy for 5 days.  She reports cough is bit better since starting Doxy.   Andrea Beery saw Dr Candie Echevaria [facility]) on 06/13/23. Unfortunately, he was unable to access neither the [place] CT Chest imaging from 06/06/23, nor an radilogy interpretation.    His concerns was the interstitial lung disease in setting of SLE and Devic's syndrome.    Requested radiology read of Chest CT. I will see if I can get at least the interpretation to Dr Candie Echevaria [facility])

## 2023-06-17 NOTE — Telephone Encounter (Signed)
I spoke with Andrea Berry about the worsening appearance of her lungs on CT with increase in ground-glass throughout lungs, consolidations in left lwer lobe and bronchiectasis.   Recommended we increase the duration of her Doxycycline by 5 additional days.   Additionally, a copy of the 9/26 Chest CT interpretation was fax'd to DR Namen [facility]) office as his 10/3 office visit note indicated he wished to know more about this CT's results before making decision about a repeat bronchoscopy.

## 2023-06-18 ENCOUNTER — Other Ambulatory Visit: Payer: Self-pay | Admitting: Family Medicine

## 2023-06-18 DIAGNOSIS — E2749 Other adrenocortical insufficiency: Secondary | ICD-10-CM

## 2023-06-18 DIAGNOSIS — R0609 Other forms of dyspnea: Secondary | ICD-10-CM

## 2023-07-09 ENCOUNTER — Telehealth: Payer: Self-pay

## 2023-07-09 NOTE — Telephone Encounter (Signed)
April calls nurse line requesting an apt for evaluation.   April reports the patients has had no improvement with symptoms of cough and shortness of breath. She denies any fevers and reports the patient is speaking in full sentences.   She reports she saw Pulmonology on 10/3, however they were unable to see CT results. See PCP phone note from 10/7. She reports she spoke with them today and they still have not received report. She reports they are unable to move forward without this. I will refax this today.  Patient scheduled for tomorrow for evaluation.   Strict ED precautions given for SOB.

## 2023-07-09 NOTE — Progress Notes (Signed)
  SUBJECTIVE:   CHIEF COMPLAINT / HPI:   Patient presents today with assistance from her son after being seen in the ED on 06/15/2023 in which she did not have any improvement of cough and shortness of breath.  She had CT chest with increase in groundglass throughout the lungs and consolidations in the left lower lobe and bronchiectasis and her doxycycline was increased by an additional 5 days.  She reportedly saw pulmonology (Dr. Norm Parcel @ [facility]) on 10/3 however they were unable to see CT results.  Dr. McDiarmid spoke with her on 10/7.  Pulmonology has concerns for interstitial lung disease in the setting of SLE and neuromyelitis optica.  SOB, going up a flight of stairs is challenging. She is coughing up some mucus. When she is lying on her left side, it is harder for her to breathe. When she comes off antibiotics, the symptoms come back. Her last abx was 1-2 weeks ago.   PERTINENT  PMH / PSH: SLE, neuromyelitis optica, TIA, HTN, OSA, legally blind  OBJECTIVE:  BP 118/80   Pulse 84   Temp 98.1 F (36.7 C)   Wt 168 lb (76.2 kg)   SpO2 100%   BMI 31.74 kg/m  Gen: well-appearing, NAD CV: RRR, 3/6 holosystolic murmur Pulm: Coarse crackles throughout lung fields bilaterally most prominent in the lower lobes, normal WOB  ASSESSMENT/PLAN:   Assessment & Plan Interstitial lung disease (HCC) Returns consistently with antibiotic and steroid use (although steroid use may not be significant enough).  Do not suspect that current shortness of breath is related to CHF exacerbation and more likely related to ILD given CT chest findings.  Discussed CT chest findings with patient.  Greatly appreciate pulmonology assistance.  Will reach out to PCP discussing next steps.  Advised patient to call her pulmonologist to see if she can establish sooner visit as she is currently not to be seen until April.  She remains high risk for progressive lung failure although no indication to admit to hospital at this  time.  Shelby Mattocks, DO 07/11/2023, 12:17 PM PGY-3, [facility]

## 2023-07-10 ENCOUNTER — Ambulatory Visit: Payer: Medicare Other | Admitting: Student

## 2023-07-10 ENCOUNTER — Encounter: Payer: Self-pay | Admitting: Student

## 2023-07-10 VITALS — BP 118/80 | HR 84 | Temp 98.1°F | Wt 168.0 lb

## 2023-07-10 DIAGNOSIS — J849 Interstitial pulmonary disease, unspecified: Secondary | ICD-10-CM | POA: Diagnosis not present

## 2023-07-10 NOTE — Patient Instructions (Signed)
It was great to see you today! Thank you for choosing [facility] for your primary care.  Today we addressed: I have attached your reading of your CT chest.  This is concerning for interstitial lung disease and other less common infectious pulmonary conditions as you are on immunosuppressive medications.  I am going to speak with Dr. McDiarmid and you will hear from either myself or him in the next couple days on where to go next.  Please see if you can get an earlier appointment with your pulmonologist hopefully in the next 1 to 2 months.  CT chest IMPRESSION: 1. Moderate patchy ground-glass opacity throughout both lungs involving all lung lobes, which demonstrates a patchy centrilobular micronodular appearance in the lower lobes, worsened significantly from 12/28/2020 chest CT. Minimal new patchy peribronchovascular consolidation in the posterior left lower lobe. Mild cylindrical bronchiectasis in the bilateral lower lobes with associated bronchial wall thickening, mildly worsened. Favor a nonspecific chronic or recurrent infectious or inflammatory process such as due to recurrent aspiration, with differential including pulmonary vasculitis. 2. Dilated main pulmonary artery, suggesting pulmonary arterial hypertension. 3. Nonobstructing bilateral nephrolithiasis. 4.  Aortic Atherosclerosis (ICD10-I70.0).  If you haven't already, sign up for My Chart to have easy access to your labs results, and communication with your primary care physician.  Return if symptoms worsen or fail to improve. Please arrive 15 minutes before your appointment to ensure smooth check in process.  We appreciate your efforts in making this happen.  Thank you for allowing me to participate in your care, Andrea Mattocks, DO 07/10/2023, 12:05 PM PGY-3, [facility]

## 2023-07-11 NOTE — Assessment & Plan Note (Signed)
Returns consistently with antibiotic and steroid use (although steroid use may not be significant enough).  Do not suspect that current shortness of breath is related to CHF exacerbation and more likely related to ILD given CT chest findings.  Discussed CT chest findings with patient.  Greatly appreciate pulmonology assistance.  Will reach out to PCP discussing next steps.  Advised patient to call her pulmonologist to see if she can establish sooner visit as she is currently not to be seen until April.  She remains high risk for progressive lung failure although no indication to admit to hospital at this time.

## 2023-07-12 NOTE — Telephone Encounter (Signed)
I spoke with Molly Maduro at [facility] Pulmonary Department about getting fax of Ms Mullens's Chest CT report to the attention of her pulmonologist, Dr A. Namen.  Molly Maduro said he would work on getting the fax to the right place.   I resent the CT report fax to the fax number given to me by Molly Maduro, [phone].   Attempted to contact Ms Lansdale about her cough and pulm consultation, but no answer. No voice mail available.   Will reattempt contacting patient.

## 2023-07-29 ENCOUNTER — Telehealth: Payer: Self-pay

## 2023-07-29 NOTE — Telephone Encounter (Signed)
Received call from patient's caregiver, April regarding patient. She feels like that headaches are getting worse, she is having muscle spasms, continued issues with breathing.   Muscle spasms in hands, feet and calves.   She went for CT scan at [facility] on 11/13, they have not heard back on these results.   Advised that patient should be re-evaluated.   Scheduled with PCP on Wednesday afternoon.   Veronda Prude, RN

## 2023-07-31 ENCOUNTER — Ambulatory Visit (INDEPENDENT_AMBULATORY_CARE_PROVIDER_SITE_OTHER): Payer: Medicare Other | Admitting: Family Medicine

## 2023-07-31 VITALS — BP 131/74 | HR 105 | Wt 167.0 lb

## 2023-07-31 DIAGNOSIS — M791 Myalgia, unspecified site: Secondary | ICD-10-CM

## 2023-07-31 DIAGNOSIS — E559 Vitamin D deficiency, unspecified: Secondary | ICD-10-CM | POA: Diagnosis not present

## 2023-07-31 DIAGNOSIS — R0609 Other forms of dyspnea: Secondary | ICD-10-CM

## 2023-07-31 DIAGNOSIS — R519 Headache, unspecified: Secondary | ICD-10-CM | POA: Diagnosis not present

## 2023-07-31 DIAGNOSIS — F411 Generalized anxiety disorder: Secondary | ICD-10-CM

## 2023-07-31 DIAGNOSIS — G8929 Other chronic pain: Secondary | ICD-10-CM

## 2023-07-31 DIAGNOSIS — M62838 Other muscle spasm: Secondary | ICD-10-CM

## 2023-07-31 MED ORDER — AMITRIPTYLINE HCL 10 MG PO TABS: 10.0000 mg | ORAL_TABLET | Freq: Every day | ORAL | 0 refills | Status: DC

## 2023-07-31 MED ORDER — LORAZEPAM 0.5 MG PO TABS: 0.5000 mg | ORAL_TABLET | Freq: Two times a day (BID) | ORAL | 2 refills | Status: DC

## 2023-07-31 NOTE — Progress Notes (Addendum)
Andrea Berry is accompanied by patient and son Sources of clinical information for visit is/are patient and past medical records. Nursing assessment for this office visit was reviewed with the patient for accuracy and revision.   Previous Report(s) Reviewed: Last office visit notes from Neuro 11/05/22; Hem/onc 06/04/23; Nephrology 06/10/23; 06/13/23 pulmonology; UC visit 06/15/23; [facility] visit 07/10/23; and Allergy/Immun 07/18/23.     07/10/2023   12:29 PM  Depression screen PHQ 2/9  Decreased Interest 1  Down, Depressed, Hopeless 1  PHQ - 2 Score 2  Altered sleeping 1  Tired, decreased energy 1  Change in appetite 1  Feeling bad or failure about yourself  0  Trouble concentrating 1  Moving slowly or fidgety/restless 0  Suicidal thoughts 0  PHQ-9 Score 6  Difficult doing work/chores Somewhat difficult   Flowsheet Row Office Visit from 07/10/2023 in [facility] Dept Of [place]. [facility] Office Visit from 05/30/2023 in [facility] - A Dept Of [place]. [facility] Office Visit from 05/10/2023 in [facility] - A Dept Of . [facility]  Thoughts that you would be better off dead, or of hurting yourself in some way Not at all Not at all Not at all  PHQ-9 Total Score 6 0 4          03/29/2023    2:15 PM 05/26/2020    2:39 PM 07/23/2019   11:23 AM 03/27/2019   11:08 AM 07/16/2018   12:21 PM  Fall Risk   Falls in the past year? 0 0 1 0 0  Number falls in past yr: 0 0 0 0   Injury with Fall? 0  0    Risk for fall due to : Impaired mobility Impaired vision   Impaired vision;Medication side effect;Impaired mobility  Follow up Falls prevention discussed;Education provided;Falls evaluation completed           07/10/2023   12:29 PM 05/30/2023    2:29 PM 05/10/2023    3:52 PM  PHQ9 SCORE ONLY  PHQ-9 Total Score 6 0 4    Health Maintenance Due  Topic Date Due   COVID-19 Vaccine (6 - 2023-24 season)  05/12/2023      History/P.E. limitations: none  There are no preventive care reminders to display for this patient. There are no preventive care reminders to display for this patient.  Health Maintenance Due  Topic Date Due   COVID-19 Vaccine (6 - 2023-24 season) 05/12/2023     Chief Complaint  Patient presents with   Shortness of Breath   Headache   Spasms    HEADACHE  Fall with head rrauma to right side of head.  05/06/23 at ED for headace Head CT time of fall without. Headache reported resolved 05/10/23 [facility] visit.  05/31/23 [facility] visit with me she reported frequent mild to moderate headaches with nausea - taking APAP with Adequate relief but recurrs. Working explanation is post-concussive headache with migrainous qualities. "She may need a prophylactic med.".  Current headaches are occurring several times a day, on left side of face,non-throbbing, pain requires her to retire to bed; responds temporrily to APAP, last few hours at a time, interferes with her ability to carry on usual tasks   Diagnosis with SLE back in early 2000s - legally blind after stroke or NMO in 2008.   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 Neuro tx'd with high-dose sterodids.  Neuoro suspected ted bilateral optic neuritis. Patient begun on Rituxamab by Dr Epimenio Foot at [facility].  Patient referred to [facility] and Neuro-ophthalmology for immunosupressant therapy.   Last Neurology office visit was at [facility] was with Ms A. Stepp, NP, whose Assessment and Plan mentioned, "discussed whether or not to repeat surveillance MRI brain. She is without new symptoms and has consistently been on Rituxan  treatment. We will defer at this time." She also noted Ms Buczynski's complaint, "She is clinically stable, her main complaint today is left facial pain that is getting worse. This sounds like trigeminal neuralgia.   Unfortunately, Ms Braniff was unable to perform the tasks required to connect for a videovisit on 07/15/23.  She was unable to get a repeat videovisit until 10/2023.  Similarity to prior headaches: uncertain Precipitating factors: possibly concussion Frequency & Type of analgesic use & duration: daily APAP  Prior Headache treatments: APAP  UPDATE: (+) Tramdol, Tizanidine, Oxacarbazepine, Lorazepam, Duloxetine, Diltiazem Current Hormonal contraception: no  Current Gabapentin/Lyrica: yes  Current NSAIDS:  no  Current OTC analgesics:  yes  Current triptans:  no  Current anti-emetic:  yes, Zofran  Current muscle relaxants:  yes, tizanidine  Current anti-anxiolytic:  yes, lorazepam  Current sleep aide:  no  Current Antihypertensive medications:  yes, Diltiazem  Current Antidepressant medications:  yes, Duloxetine  Current Anticonvulsant medications:  yes, Trilepta (as analgesic)  Current Vitamins/Herbal/Supplements:  no  Current Antihistamines/Decongestants:  yes, [place] Nasal Spray   Associated Symptoms Nausea/vomiting: yes  Photophobia/phonophobia: no  Change in vision:  no, legally blind to start with  Change in speech:  no  Numbness or weakness in limb:  yes, numbness and limb spasms  Sinus pain/pressure: no    Red Flags High Risk Red Flags (one positive is usually enough) Fever: no  Focal weakness/numbness: yes, intermittent spasms of hands and feet Altered mental status: no  Trauma: yes, Fall prior to headache onset, Head CT negative.   Anticoagulant use: yes Hormonal Contraception: Neck pain/stiffness: no H/o lung/breast/kidney/melanoma/NHL cancer: no HIV: no   Dyspnea Onset(time, acute vs non-acute): slowly progressing over years with increase rate of  impairment since diagnosis of "pneumonia" since August 2024.   Severity: Breathless with making bed, bathing, dressing, Function impairment: yes, ADLs require more effort and leaves her breathless Makes Better: perhaps some respones to albuterol MDI  Prior  work-up:  Dr Candie Echevaria [facility] visit 06/13/23 for patients ILD and mild COPD. In his office visit note, he gave the opinion, "asked for the outside CT images for review given the lack of radiology report this may not be available as such we will have to consider repeating the CT. If the above is suggestive a repeat bronchoscopy may be required."  The [place] Chest CT report was fax'd to Dr Odette Horns office.  A repeat Chest CT was obtained on 07/24/23 (and compared to 06/06/23) which showed improvement in in bilateral opacities, worsening of fibrotic changes and increased bronchiectasis.    PMH COPD: yes ILD: yes OSA / OHS: no Collagen Vasc Disorder / Autoimmune Disorder:  yes Asthma: no Heart Failure: no, transthoracic echocardiogram 03/07/23 EF 65-70% with severe asymmetric LVH without evidence of LVOT obstruction. Normal RV function, estimate RAP 3 mmHg. Transthoracic echocardiogram 10/22/22  at [facility] similar but mild LVH.   05/30/23 BNP = 113.7 pg/ml & Hgb 10.6  Heart Valve Disease: no VTE: yes, history of PE in 2017 - on anticoagulation Coronary Disease: no Cancer: no  Renal Failure / Insufficiency: yes, Stage 3a Liver disease: no History of Pneumonia: yes Tobacco use: no  Associated Symptoms Recent head or chest cold: no Cough: yes, frequent productive of unknown color sputum greater than 3 tbs a day Hemoptysis: no, though patient is blind Fever:no Chills: no Headache: yes Palpitations: no Leg swelling: no Weight gain: no Orthopnea: no Parox Noct Dyspnea: no Dyspnea on Exertion: yes Leg pain, new: yes, intermittent foot spasms and calf spasms Blood thinners / Aspirin: yes, DOAC Inhalers:  yes, albuterol  MDI Indigestion / Reflux: yes, esomeprazole   Physical exam  Vitals Wt Readings from Last 3 Encounters:  07/31/23 167 lb (75.8 kg)  07/10/23 168 lb (76.2 kg)  06/15/23 166 lb (75.3 kg)   Temp Readings from Last 3 Encounters:  07/10/23 98.1 F (36.7 C)  06/15/23 98.4 F (36.9 C) (Oral)  05/30/23 98.4 F (36.9 C) (Oral)   BP Readings from Last 3 Encounters:  07/31/23 131/74  07/10/23 118/80  06/15/23 120/73   Pulse Readings from Last 3 Encounters:  07/31/23 (!) 105  07/10/23 84  06/15/23 75   Filed Weights   07/31/23 1545  Weight: 167 lb (75.8 kg)   SaO2 RA 100%  General:in no apparent distress Cor: normal rate, regular rhythm, normal S1, S2, no murmurs, rubs, clicks or gallops Lungs:  Fine crackles posterior lower lung fields bilaterally [id number], no edema, no muscle wasting of hands Neurologic exam : Cn 3-12 intact Strength equal & normal in upper & lower extremities Gait with white cane normal Romberg normal No tremor rest or posture.  DTR Knee: right 0+, left brisk 2-3+  LABS/imaging:  Pending  --------------------------------------------------------------------------------------------------------------------------------------------- Visit Problem List with A/P  Headache Established problem Unimproved.   Patient is not at goal of resolution of pain. The differential runs from primary headache (migraine/TTH), secondary (post-concussive headache, CN neuralgia, Central process secondary to her demyelinating condition or cerebritis for SLE)  Will contact [facility] neurology to see if they want to see the patient sooner or have recommendations for further work-up/treatment.  The concurrent spasms in legs and feet may just be muscle spasm of aging, but could be a manifestation of a central process, like MS.   Ms Georgena Spurling may use her Tramadol for headache not controlled by APAP.  Started Amitriptyline 10 mg at bedtime, will slowly titrate as tolerated.   Could  consider increase in oxcarbazepine dose for possible trigeminal neuralgia RTC one week   Dyspnea on exertion Established problem worsened.  Patient is not at goal of exertional shortness of breath  Dyspnea, tachypnea Differential Multiple possible contributing conditions - Chronic obstructive pulmonary disease - Deconditioning - Bronchiectasis with copious sputum production - ILD - Heart Failure - Atypical CAD - Recurrent PE, but on DOAC  Will contact Ms Bachar's pulmonologist at [facility] to see if he has any further workup or treatment recommendations.  Continue albuterol meter dosed inhaler - consider addition of LAMA or LABA or combo Flutter valve? RTC one week

## 2023-07-31 NOTE — Patient Instructions (Addendum)
Muscle spasms We are checking blood work  Stop taking atorvastatin and stop taking Alendronate Dr Jahanna Raether try to get in contact with [facility] to see if they have suggestions.  Cough and Shortness of Breat Your recent Lung CAT scan was improved except for increase in lung scarring and increase in bronchiectasis.   Dr Clemencia Helzer will try to get in contact with Dr Candie Echevaria to see if he has any suggestions.   Headache - This seems to be a post-concussion headache - Try your tramadol for the headaches along with your Tylenol/Acetaminophen.  - Start a new medication called amitryptiline. It is used to prevent headaches.  We will start at a low dose and work up as tolerated.  It may make you drwosy, so take at bedtime.

## 2023-08-02 ENCOUNTER — Other Ambulatory Visit: Payer: Medicare Other

## 2023-08-02 ENCOUNTER — Encounter: Payer: Self-pay | Admitting: Family Medicine

## 2023-08-02 NOTE — Assessment & Plan Note (Signed)
Established problem Unimproved.   Patient is not at goal of resolution of pain. The differential runs from primary headache (migraine/TTH), secondary (post-concussive headache, CN neuralgia, Central process secondary to her demyelinating condition or cerebritis for SLE)  Will contact [facility] neurology to see if they want to see the patient sooner or have recommendations for further work-up/treatment.  The concurrent spasms in legs and feet may just be muscle spasm of aging, but could be a manifestation of a central process, like MS.   Ms Georgena Spurling may use her Tramadol for headache not controlled by APAP.  Started Amitriptyline 10 mg at bedtime, will slowly titrate as tolerated.   Could consider increase in oxcarbazepine dose for possible trigeminal neuralgia RTC one week

## 2023-08-02 NOTE — Assessment & Plan Note (Signed)
Established problem worsened.  Patient is not at goal of exertional shortness of breath Dyspnea, tachypnea Differential Multiple possible contributing conditions Chronic obstructive pulmonary disease Deconditioning Bronchiectasis with copious sputum production ILD Heart Failure Heart disease Recurrent PE, on DOAC  Will contact Ms Leng's pulmonologist at [facility] to see if he has any further workup or treatment recommendations.  Continue albuterol meter dosed inhaler - consider addition of LAMA or LABA or combo Flutter valve? RTC one week

## 2023-08-02 NOTE — Telephone Encounter (Signed)
Left message to return call. Patient needs to reschedule today's lab appointment to another day or go to [facility]. Andrea Berry

## 2023-08-05 ENCOUNTER — Telehealth: Payer: Self-pay | Admitting: Family Medicine

## 2023-08-05 ENCOUNTER — Telehealth: Payer: Self-pay

## 2023-08-05 NOTE — Telephone Encounter (Signed)
I had a very helpful discussion with Ms. Andrea Lanes, NP,  About our mutual patient, Ms Andrea Berry, and her face/head pain, as well as the dystonic events in her hands/legs/feet.   Ms Andrea Berry had previously started Trilepta for Ms Andrea Berry left side face pain in February of this year with expectation Ms Andrea Berry would call her about the response to the medication.  This has not happened.   Ms Andrea Berry though we could increase the Trilepta to 150 mg in moringin and 300 mg at bedtime.  She asked that Ms Andrea Berry reach out to Ms Andrea Berry by phone to discuss her response to the the increased dose of med.   The other thought about her headache is that it could be just post-concussive that may need time with or without TCA treatment.   Ms Andrea Berry thought it best to get MRI of Ms Andrea Berry's brain and cervicothoracic spine with contrast as NMO can cause problems with brain stem and spine.  Ms Andrea Berry is ordering the MRI thru [facility] radiology department.   She recommended trying baclofen during day to help with the dystonia/cramping in limbs, continue the tizanidine at bedtime.    I am scheduled to see Ms Andrea Berry at [facility] in 2 days.

## 2023-08-05 NOTE — Telephone Encounter (Signed)
Contacted the patient to inform her on the information that we received regarding her MRI. Patient have an appt on 11/27 in atc she will have her blood work done then.

## 2023-08-05 NOTE — Telephone Encounter (Signed)
I had the opportunity to speak with Andrea Berry pulmonologist, Dr Candie Echevaria, at [facility].   We discussed her report of worsening shortness of breath and increase sputum production.   He will put in an order for an induced sputum and have his team contact her.  We will need to follow up to see that Andrea Berry gets this completed.   Dr Candie Echevaria had no objection to starting a LAMA for patient

## 2023-08-05 NOTE — Telephone Encounter (Signed)
-----   Message from [facility] sent at 08/05/2023 12:02 PM EST ----- Regarding: Please notify patient Please let Ms Melvyn Neth know her Nurse Practitioner, Dot Lanes, at [facility] Neurology department is ordering an MRI of her brain and spine to see what may be causing her headaches and her cramps in her hands and legs.  She should hear from [facility] radiology department in the next couple days to schedule an appointment.    Remind Ms Wiesen it is important she come into the [facility] to get her blood drawn.   [facility] will need the test results for the MRI.

## 2023-08-07 ENCOUNTER — Other Ambulatory Visit: Payer: Self-pay | Admitting: Family Medicine

## 2023-08-07 ENCOUNTER — Ambulatory Visit: Payer: Medicare Other | Admitting: Family Medicine

## 2023-08-07 VITALS — BP 133/62 | HR 83 | Ht 61.0 in | Wt 164.4 lb

## 2023-08-07 DIAGNOSIS — R519 Headache, unspecified: Secondary | ICD-10-CM | POA: Insufficient documentation

## 2023-08-07 DIAGNOSIS — J479 Bronchiectasis, uncomplicated: Secondary | ICD-10-CM | POA: Insufficient documentation

## 2023-08-07 DIAGNOSIS — J849 Interstitial pulmonary disease, unspecified: Secondary | ICD-10-CM

## 2023-08-07 DIAGNOSIS — R0609 Other forms of dyspnea: Secondary | ICD-10-CM | POA: Diagnosis not present

## 2023-08-07 DIAGNOSIS — J42 Unspecified chronic bronchitis: Secondary | ICD-10-CM

## 2023-08-07 DIAGNOSIS — M791 Myalgia, unspecified site: Secondary | ICD-10-CM | POA: Diagnosis not present

## 2023-08-07 DIAGNOSIS — E559 Vitamin D deficiency, unspecified: Secondary | ICD-10-CM

## 2023-08-07 DIAGNOSIS — M62838 Other muscle spasm: Secondary | ICD-10-CM | POA: Insufficient documentation

## 2023-08-07 DIAGNOSIS — R7303 Prediabetes: Secondary | ICD-10-CM

## 2023-08-07 LAB — POCT GLYCOSYLATED HEMOGLOBIN (HGB A1C): Hemoglobin A1C: 6.7 % — AB (ref 4.0–5.6)

## 2023-08-07 LAB — POCT SEDIMENTATION RATE: POCT SED RATE: 48 mm/h — AB (ref 0–22)

## 2023-08-07 MED ORDER — TIZANIDINE HCL 4 MG PO TABS
8.0000 mg | ORAL_TABLET | Freq: Every day | ORAL | 3 refills | Status: DC
Start: 2023-08-07 — End: 2023-08-16

## 2023-08-07 MED ORDER — UMECLIDINIUM BROMIDE 62.5 MCG/ACT IN AEPB: 1.0000 | INHALATION_SPRAY | Freq: Every day | RESPIRATORY_TRACT | 2 refills | Status: DC

## 2023-08-07 MED ORDER — BACLOFEN 10 MG PO TABS: 10.0000 mg | ORAL_TABLET | Freq: Two times a day (BID) | ORAL | 0 refills | Status: DC

## 2023-08-07 MED ORDER — TIOTROPIUM BROMIDE MONOHYDRATE 18 MCG IN CAPS: 18.0000 ug | ORAL_CAPSULE | Freq: Every day | RESPIRATORY_TRACT | 12 refills | Status: DC

## 2023-08-07 NOTE — Progress Notes (Signed)
fu

## 2023-08-07 NOTE — Assessment & Plan Note (Signed)
Established problem that has improved with no recurrence of pain since office visit last week Continue Amitriptyline 10 mg at bedtime.  Continue oxcarbazepine - may increase to 150 qam and 300 mg at bedtime if needs increase therapy in future for possible CN neuralgia.  [facility] Neurology would consider referral for gamma knife surgery if unable to control pharmacologically.

## 2023-08-07 NOTE — Assessment & Plan Note (Addendum)
Established problem Intermittent Commonly follows coughing  Rest SaO2 98% room air 150 ft slower then 1 m/sec walk on room air 92%. Recovers to 98% RA within one minute of walking.   Persistent fine crackles bilateral lower lung fields posteriorly withou wheeze of Acc mm use.   Lab: ESR 48 mmm/hr (slightly high for age and sex)  A/ Possible role of bronchiectasis & ILD in patient's shortness of breath - look fir evidence of HF with BNP today.   P/ BNP Start Spiriva inhalation daily Follow up Ms Melvyn Neth getting her induced sputum which is to be ordered by Dr Candie Echevaria [facility] Pulm) RTC 4 weeks

## 2023-08-07 NOTE — Assessment & Plan Note (Signed)
Established problem Uncontrolled.  Patient is not at goal of symptomatic resolution of spasms in hands, feet, and legs.  Start: Baclofen 10 mg twice a day during day and continue her tizanidine one tb at night.    Ms Andrea Berry at [facility] Neuro to schedule Brain and Spine MRI with CM to look for contributions from NMO to symptoms.

## 2023-08-07 NOTE — Assessment & Plan Note (Signed)
Lab Results  Component Value Date   HGBA1C 6.7 (A) 08/07/2023   Will repeat A1c in month to see if persists.

## 2023-08-07 NOTE — Assessment & Plan Note (Addendum)
Established problem that has improved with no recurrence of pain since office visit last week Continue Amitriptyline 10 mg at bedtime.

## 2023-08-07 NOTE — Patient Instructions (Addendum)
Headache Problem - Continue the Amitriptyline 10 mg tablet to one tablet at bedtime - Go for Brain MRI scheduled by Ms Lindi Adie, Nurse Practitioner, at the [facility] Neurology Department.    Cough and shortness of breath - Go for the induced sputum arranged by Dr Odette Horns office at [facility] Pulmonology Department.  - Start the inhaler Incruse, inhale once daily.  Ask your pharmacist to show you how to use the inhaler.   Spasms in hands, feet and legs - Go for the spine MRI scheduled by Ms Lindi Adie, Nurse Practitioner, [facility] Neurology Department.  - Start Baclofen 10 mg tablet, one tablet twice a day - Continue taking your Tizanidine one tablet at bedtime.  - Let Ms Lindi Adie know if the Baclofen medication is helping with your spasms.   Let Dr Chaylee Ehrsam know if you have not heard from [facility] to schedule your MRI and Induced Sputum after the Thanksgiving Holidays.

## 2023-08-07 NOTE — Progress Notes (Signed)
Andrea Berry is accompanied by son, Lenon Curt, and niece, April Sources of clinical information for visit is/are patient.  Nursing assessment for this office visit was reviewed with the patient for accuracy and revision.    Previous Report(s) Reviewed: Phone calls with Dr Candie Echevaria [facility]) and Ms Lindi Adie, NP [facility])     08/07/2023    9:12 AM  Depression screen PHQ 2/9  Decreased Interest 1  Down, Depressed, Hopeless 1  PHQ - 2 Score 2  Altered sleeping 1  Tired, decreased energy 1  Change in appetite 1  Feeling bad or failure about yourself  0  Trouble concentrating 1  Moving slowly or fidgety/restless 0  Suicidal thoughts 0  PHQ-9 Score 6  Difficult doing work/chores Somewhat difficult   Flowsheet Row Office Visit from 08/07/2023 in [facility] - A Dept Of [place]. [facility] Office Visit from 07/10/2023 in [facility] - A Dept Of [place]. [facility] Office Visit from 05/30/2023 in [facility] - A Dept Of [place]. [facility]  Thoughts that you would be better off dead, or of hurting yourself in some way Not at all Not at all Not at all  PHQ-9 Total Score 6 6 0          03/29/2023    2:15 PM 05/26/2020    2:39 PM 07/23/2019   11:23 AM 03/27/2019   11:08 AM 07/16/2018   12:21 PM  Fall Risk   Falls in the past year? 0 0 1 0 0  Number falls in past yr: 0 0 0 0   Injury with Fall? 0  0    Risk for fall due to : Impaired mobility Impaired vision   Impaired vision;Medication side effect;Impaired mobility  Follow up Falls prevention discussed;Education provided;Falls evaluation completed           08/07/2023    9:12 AM 07/10/2023   12:29 PM 05/30/2023    2:29 PM  PHQ9 SCORE ONLY  PHQ-9 Total Score 6 6 0    Health Maintenance Due  Topic Date Due   COVID-19 Vaccine (6 - 2023-24 season) 05/12/2023    History/P.E. limitations: none  There are no preventive care reminders to display  for this patient. There are no preventive care reminders to display for this patient.  Health Maintenance Due  Topic Date Due   COVID-19 Vaccine (6 - 2023-24 season) 05/12/2023     Chief Complaint  Patient presents with   Cough   Headache   Shortness of Breath   Spasms     --------------------------------------------------------------------------------------------------------------------------------------------- Visit Problem List with A/P  Left-sided face pain Established problem that has improved with no recurrence of pain since office visit last week Continue Amitriptyline 10 mg at bedtime.  Continue oxcarbazepine - may increase to 150 qam and 300 mg at bedtime if needs increase therapy in future for possible CN neuralgia.  [facility] Neurology would consider referral for gamma knife surgery if unable to control pharmacologically.    Headache Established problem that has improved with no recurrence of pain since office visit last week Continue Amitriptyline 10 mg at bedtime.      Muscle spasm Established problem Uncontrolled.  Patient is not at goal of symptomatic resolution of spasms in hands, feet, and legs.  Start: Baclofen 10 mg twice a day during day and continue her tizanidine one tb at night.    Ms Lindi Adie at [facility]  Neuro to schedule Brain and Spine MRI with CM to look for contributions from NMO to symptoms.    Dyspnea on exertion Established problem Intermittent Commonly follows coughing  Rest SaO2 98% room air 150 ft slower then 1 m/sec walk on room air 92%. Recovers to 98% RA within one minute of walking.   Persistent fine crackles bilateral lower lung fields posteriorly withou wheeze of Acc mm use.   A/ Possible role of bronchiectasis & ILD in patient's shortness of breath - look fir evidence of HF with BNP today.   P/ BNP Start Incruse one inhalation daily Follow up Ms Melvyn Neth getting her induced sputum which is to be ordered by Dr Candie Echevaria [facility] Pulm) RTC 4  weeks

## 2023-08-08 LAB — TSH RFX ON ABNORMAL TO FREE T4: TSH: 1.58 u[IU]/mL (ref 0.450–4.500)

## 2023-08-09 LAB — CMP14+EGFR
ALT: 7 [IU]/L (ref 0–32)
AST: 16 [IU]/L (ref 0–40)
Albumin: 4.2 g/dL (ref 3.8–4.9)
Alkaline Phosphatase: 68 [IU]/L (ref 44–121)
BUN/Creatinine Ratio: 8 — ABNORMAL LOW (ref 9–23)
BUN: 8 mg/dL (ref 6–24)
Bilirubin Total: 0.3 mg/dL (ref 0.0–1.2)
CO2: 20 mmol/L (ref 20–29)
Calcium: 9.6 mg/dL (ref 8.7–10.2)
Chloride: 105 mmol/L (ref 96–106)
Creatinine, Ser: 0.97 mg/dL (ref 0.57–1.00)
Globulin, Total: 2.2 g/dL (ref 1.5–4.5)
Glucose: 83 mg/dL (ref 70–99)
Potassium: 3.9 mmol/L (ref 3.5–5.2)
Sodium: 142 mmol/L (ref 134–144)
Total Protein: 6.4 g/dL (ref 6.0–8.5)
eGFR: 69 mL/min/{1.73_m2} (ref >59)

## 2023-08-09 LAB — VITAMIN D 25 HYDROXY (VIT D DEFICIENCY, FRACTURES): Vit D, 25-Hydroxy: 56.5 ng/mL (ref 30.0–100.0)

## 2023-08-09 LAB — MAGNESIUM: Magnesium: 2.2 mg/dL (ref 1.6–2.3)

## 2023-08-09 LAB — BRAIN NATRIURETIC PEPTIDE: BNP: 64.7 pg/mL (ref 0.0–100.0)

## 2023-08-09 LAB — CK: Total CK: 96 U/L (ref 32–182)

## 2023-08-13 LAB — CMP14+EGFR

## 2023-08-13 LAB — BRAIN NATRIURETIC PEPTIDE

## 2023-08-13 LAB — VITAMIN D 25 HYDROXY (VIT D DEFICIENCY, FRACTURES)

## 2023-08-13 LAB — CK

## 2023-08-13 LAB — MAGNESIUM

## 2023-08-16 ENCOUNTER — Other Ambulatory Visit: Payer: Self-pay | Admitting: Family Medicine

## 2023-08-16 DIAGNOSIS — M62838 Other muscle spasm: Secondary | ICD-10-CM

## 2023-08-25 ENCOUNTER — Other Ambulatory Visit: Payer: Self-pay | Admitting: Family Medicine

## 2023-08-25 DIAGNOSIS — G8929 Other chronic pain: Secondary | ICD-10-CM

## 2023-09-05 ENCOUNTER — Ambulatory Visit: Payer: Medicare Other | Admitting: Family Medicine

## 2023-09-05 VITALS — BP 127/81 | HR 97 | Ht 61.0 in | Wt 168.2 lb

## 2023-09-05 DIAGNOSIS — R053 Chronic cough: Secondary | ICD-10-CM | POA: Diagnosis not present

## 2023-09-05 DIAGNOSIS — M62838 Other muscle spasm: Secondary | ICD-10-CM

## 2023-09-05 DIAGNOSIS — G44309 Post-traumatic headache, unspecified, not intractable: Secondary | ICD-10-CM

## 2023-09-05 DIAGNOSIS — J849 Interstitial pulmonary disease, unspecified: Secondary | ICD-10-CM

## 2023-09-05 DIAGNOSIS — E559 Vitamin D deficiency, unspecified: Secondary | ICD-10-CM

## 2023-09-05 DIAGNOSIS — J479 Bronchiectasis, uncomplicated: Secondary | ICD-10-CM | POA: Diagnosis not present

## 2023-09-05 DIAGNOSIS — R519 Headache, unspecified: Secondary | ICD-10-CM

## 2023-09-05 NOTE — Patient Instructions (Signed)
I am glad you are feeling better. If your breathing gets worse, let Dr Cove Haydon know.  He can talk with Dr Candie Echevaria to see if he thinks we should do something different.    Let's meet again after you have meet with your neurologist and Dr Candie Echevaria.  We will then have a better idea how to keep you feeling well.    Stay away from anybody that has a head or chest cold, has the flu or Covid.

## 2023-09-06 ENCOUNTER — Encounter: Payer: Self-pay | Admitting: Family Medicine

## 2023-09-06 NOTE — Assessment & Plan Note (Signed)
Vitamin D 25(OH) serum level 56. Continue Cholcalciferol 50 microgram daily

## 2023-09-06 NOTE — Assessment & Plan Note (Signed)
Established problem that has improved with start of Azithromycin every other day by Dr Candie Echevaria [facility]) Continue current medication regiment.

## 2023-09-06 NOTE — Assessment & Plan Note (Signed)
Established problem that has improved with addition of Incruse daily. Less dyspnea with exertion.  Able to perform ADLs and walk about home without dyspnea. Continue Incruse

## 2023-09-06 NOTE — Assessment & Plan Note (Signed)
Established problem of presumed post-concussion headache that has improved and has meet goal of headache free since start of amitryptiline 10 mg at bedtime. .  Continue current medication regiment.

## 2023-09-06 NOTE — Progress Notes (Signed)
Andrea Berry is accompanied by son, Andrea Berry Sources of clinical information for visit is/are patient and [facility] specialty notes. Nursing assessment for this office visit was reviewed with the patient for accuracy and revision.     Previous Report(s) Reviewed: TC 08/09/23 by Dr Candie Echevaria and MRI orders by Ms Lindi Adie (Neuro)     09/05/2023    4:02 PM  Depression screen PHQ 2/9  Decreased Interest 1  Down, Depressed, Hopeless 0  PHQ - 2 Score 1  Altered sleeping 1  Tired, decreased energy 1  Change in appetite 1  Feeling bad or failure about yourself  0  Trouble concentrating 3  Moving slowly or fidgety/restless 0  Suicidal thoughts 0  PHQ-9 Score 7  Difficult doing work/chores Somewhat difficult   Flowsheet Row Office Visit from 09/05/2023 in [facility] - A Dept Of [place]. [facility] Office Visit from 08/07/2023 in [facility] - A Dept Of [place]. [facility] Office Visit from 07[facility]10/2023 in [facility] - A Dept Of [place]. [facility]  Thoughts that you would be better off dead, or of hurting yourself in some way Not at all Not at all Not at all  PHQ-9 Total Score 7 6 6           09/05/2023    4:02 PM 03/29/2023    2:15 PM 05/26/2020    2:39 PM 07/23/2019   11:23 AM 03/27/2019   11:08 AM  Fall Risk   Falls in the past year? 1 0 0 1 0  Number falls in past yr: 0 0 0 0 0  Injury with Fall? 1 0  0   Risk for fall due to :  Impaired mobility Impaired vision    Follow up  Falls prevention discussed;Education provided;Falls evaluation completed          09/05/2023    4:02 PM 08/07/2023    9:12 AM 07/10/2023   12:29 PM  PHQ9 SCORE ONLY  PHQ-9 Total Score 7 6 6    History/P.E. limitations: none       Chief Complaint  Patient presents with   Medical Management of Chronic Issues      --------------------------------------------------------------------------------------------------------------------------------------------- Visit Problem List with A/P  No problem-specific Assessment & Plan notes found for this encounter.

## 2023-09-06 NOTE — Assessment & Plan Note (Addendum)
Established problem that has decreased signficantly with taking tizanidine.   She did not take the baclofen.   Ms Andrea Berry [facility]) has ordered Brain, cerivcal and thoracic spine MRIs Continue tizanidine

## 2023-09-06 NOTE — Assessment & Plan Note (Signed)
Dr Candie Echevaria has started Andrea Berry on Azithromycin every other day with significant improvement in her cough.

## 2023-09-06 NOTE — Assessment & Plan Note (Signed)
Established problem that has improved.  No further facial pain.  Did not need to increase oxcarbazepine dose. Will continue Amitriptyline 10 mg at bedtime in case it is helping.

## 2023-09-14 ENCOUNTER — Other Ambulatory Visit: Payer: Self-pay | Admitting: Family Medicine

## 2023-09-14 DIAGNOSIS — R519 Headache, unspecified: Secondary | ICD-10-CM

## 2023-09-14 DIAGNOSIS — Z86711 Personal history of pulmonary embolism: Secondary | ICD-10-CM

## 2023-09-14 DIAGNOSIS — D6862 Lupus anticoagulant syndrome: Secondary | ICD-10-CM

## 2023-09-25 ENCOUNTER — Other Ambulatory Visit: Payer: Self-pay | Admitting: Family Medicine

## 2023-09-25 DIAGNOSIS — E2749 Other adrenocortical insufficiency: Secondary | ICD-10-CM

## 2023-09-27 ENCOUNTER — Telehealth (HOSPITAL_COMMUNITY): Payer: Self-pay | Admitting: *Deleted

## 2023-09-27 ENCOUNTER — Telehealth: Payer: Self-pay

## 2023-09-27 NOTE — Telephone Encounter (Signed)
Andrea Berry calls nurse line in regards to (2) concerns.   (1) She would like for PCP to review her Pulmonologist recommendations on daily azithromycin use. (See 09/21/2023 phone note.) She reports a daily antibiotic seems "odd" to them. She reports they would like PCP opinion on this.  (2) She reports they were referred to pulmonary rehab, however they have not heard from anyone. I advised per chart review they have tried to reach out to [place] to schedule, however no answer. Information given to Andrea Berry to call and schedule.   She was appreciative of information.

## 2023-09-27 NOTE — Telephone Encounter (Signed)
Received pulmonary rehab referral from Dr. Candie Echevaria at [facility] health for this pt to participate in PR here at [facility].  Pt is agreeable to this location and wishes to proceed.  Will forward to pulmonary rehab staff for review and support staff for insurance verification and eligibility.  Pt is eager to schedule. Alanson Aly, BSN Cardiac and Emergency planning/management officer

## 2023-09-30 ENCOUNTER — Encounter (HOSPITAL_COMMUNITY): Payer: Self-pay

## 2023-09-30 NOTE — Progress Notes (Addendum)
Called patient to see if she was interested in participating in the Pulmonary Rehab Program. Patient did not answer. VM was left.

## 2023-09-30 NOTE — Progress Notes (Signed)
Received referral from Dr Candie Echevaria for this pt to participate in Pulmonary Rehab with the diagnosis of ILD. Clinical review of pt follow up appt on 09/10/23 Pulmonary office note. Pt appropriate for scheduling for Pulmonary rehab. Will forward to support staff for scheduling and verification of insurance eligibility/benefits with pt consent.   Andrea Berry Cardiac and Pulmonary Rehab

## 2023-10-01 ENCOUNTER — Telehealth (HOSPITAL_COMMUNITY): Payer: Self-pay

## 2023-10-01 NOTE — Telephone Encounter (Signed)
Pt insurance is active and benefits verified through [facility] Medicare. Co-pay $10.00, DED $0.00/$0.00 met, out of pocket $4,000.00/$0.00 met, co-insurance 0%. No pre-authorization required. Ann b./[employer] Medicare, 10/01/23 @ 9:43AM, BJY#[id number]

## 2023-10-01 NOTE — Telephone Encounter (Signed)
Pt returned PR phone call and stated she is interested. Patient will come in for orientation on 10/02/23 @ 9AM and will attend the 10:15AM exercise class.

## 2023-10-02 ENCOUNTER — Telehealth (HOSPITAL_COMMUNITY): Payer: Self-pay

## 2023-10-02 ENCOUNTER — Encounter (HOSPITAL_COMMUNITY)
Admission: RE | Admit: 2023-10-02 | Discharge: 2023-10-02 | Disposition: A | Discharge status: Home / Self Care | Payer: Medicare Other | Source: Ambulatory Visit | Attending: Pulmonary Disease | Admitting: Pulmonary Disease

## 2023-10-02 ENCOUNTER — Encounter (HOSPITAL_COMMUNITY): Payer: Self-pay

## 2023-10-02 VITALS — BP 122/74 | HR 107 | Ht 61.0 in | Wt 170.9 lb

## 2023-10-02 DIAGNOSIS — J849 Interstitial pulmonary disease, unspecified: Secondary | ICD-10-CM | POA: Diagnosis present

## 2023-10-02 NOTE — Progress Notes (Signed)
Pulmonary Rehab Orientation Physical Assessment Note    Well appearing, A&Ox4, NAD, son Thurston Pounds with pt Eyes/Ears: Pt is partially blind, stated she can see shadows, shapes, colors, uses a white cane to avoid obstacles Lungs: Crackles to BLLs, endorses chronic cough with thick sputum, dyspnea on exertion Heart: Regular rate rhythm, no murmurs, no rubs, no clicks Gastrointestinal: abdomin soft, + bowel sounds in all 4 quads, stated she has lost about 10# in last month d/t decreased appetite Genitourinary: WNL, pt denies s/s Extremities:  +2 pulses, grip strength unequal, weak on left side of body, no edema, no cyanosis, no clubbing Integumentary: pt denies any rashes, open or non healing wounds Psy/Soc: PHQ 2 & 9 scores were 1 & 4. Pt states she has a history of anxiety and depression. Currently on treatment and is medication compliant. Pt endorses food insecurity. Stated she currently has resources and denies any additional needs at this time. Son, Thurston Pounds accompanied pt to appointment today Assistive devices: White cane for partial blindness, has walker at home

## 2023-10-02 NOTE — Telephone Encounter (Signed)
Called Tammera to let her know the department was still open. She stated she was unsure if she would come to her orientation appointment due to her driver being unsure about the driving conditions. Pollyann Savoy department number in case she needed to reschedule.

## 2023-10-02 NOTE — Progress Notes (Signed)
Geroge Baseman 58 y.o. female Pulmonary Rehab Orientation Note This patient who was referred to Pulmonary Rehab by Dr. Candie Echevaria from [facility] with the diagnosis of ILD arrived today in Cardiac and Pulmonary Rehab. She  arrived ambulatory with assistive device with normal gait. She  does not carry portable oxygen. Per patient, Erabella uses oxygen never. Color good, skin warm and dry. Patient is oriented to time and place. Patient's medical history, psychosocial health, and medications reviewed. Psychosocial assessment reveals patient lives with family. Kamdynn is currently unemployed, disabled. Patient hobbies include volunteering at church and listening to music and the tv. Patient reports her stress level is low. Areas of stress/anxiety include health. Patient does exhibit signs of depression. Signs of depression include anxiety and difficulty maintaining sleep. PHQ2/9 score 1/4. Yisroel Ramming is currently being treated for anxiety and depression. Basilisa shows good  coping skills with positive outlook on life. Offered emotional support and reassurance. Will continue to monitor. Physical assessment performed by Essie Hart RN. Please see their orientation physical assessment note. Sirat reports she does take medications as prescribed. Patient states she follows a regular  diet. The patient reports no specific efforts to gain or lose weight.. Patient's weight will be monitored closely. Demonstration and practice of PLB using pulse oximeter. Shweta able to return demonstration satisfactorily. Safety and hand hygiene in the exercise area reviewed with patient. Lateya voices understanding of the information reviewed. Department expectations discussed with patient and achievable goals were set. The patient shows enthusiasm about attending the program and we look forward to working with Bonita Quin. Gilma completed a 6 min walk test today and is scheduled to begin exercise on 10/08/23 at 10:15 am.   [id number] Joya San, MS, ACSM-CEP

## 2023-10-02 NOTE — Progress Notes (Signed)
Pulmonary Individual Treatment Plan  Patient Details  Name: Andrea Berry MRN: [id number] Date of Birth: 1966/03/12 Referring Provider:   Doristine Devoid Pulmonary Rehab Walk Test from 10/02/2023 in [facility]  Referring Provider Namen  [[name]]       Initial Encounter Date:  Flowsheet Row Pulmonary Rehab Walk Test from 10/02/2023 in [facility]  Date 10/02/23       Visit Diagnosis: Interstitial lung disease (HCC)  Patient's Home Medications on Admission:   Current Outpatient Medications:    alendronate (FOSAMAX) 70 MG tablet, Take 1 tablet (70 mg total) by mouth every Friday., Disp: 12 tablet, Rfl: 3   ALTAMIST SPRAY 0.65 % nasal spray, Place 2 sprays into the nose 2 (two) times daily., Disp: , Rfl:    amitriptyline (ELAVIL) 10 MG tablet, TAKE 1 TABLET BY MOUTH EVERYDAY AT BEDTIME, Disp: 90 tablet, Rfl: 0   atorvastatin (LIPITOR) 40 MG tablet, Take 40 mg by mouth daily., Disp: , Rfl:    azithromycin (ZITHROMAX) 250 MG tablet, Take 1 tablet by mouth daily., Disp: , Rfl:    Cholecalciferol (VITAMIN D3) 50 MCG (2000 UT) TABS, Take 2 tablets by mouth daily., Disp: , Rfl:    diltiazem (CARDIZEM CD) 180 MG 24 hr capsule, TAKE 1 CAPSULE BY MOUTH EVERY DAY, Disp: 90 capsule, Rfl: 3   DULoxetine (CYMBALTA) 60 MG capsule, TAKE 1 CAPSULE BY MOUTH EVERY DAY, Disp: 90 capsule, Rfl: 3   esomeprazole (NEXIUM) 20 MG capsule, TAKE 1 CAPSULE (20 MG TOTAL) BY MOUTH DAILY AT 12 NOON., Disp: 90 capsule, Rfl: 3   gabapentin (NEURONTIN) 300 MG capsule, Take 2 capsules (600 mg total) by mouth 2 (two) times daily., Disp: , Rfl:    linaclotide (LINZESS) 145 MCG CAPS capsule, Take 145 mcg by mouth daily before breakfast., Disp: , Rfl:    LORazepam (ATIVAN) 0.5 MG tablet, Take 1 tablet (0.5 mg total) by mouth 2 (two) times daily., Disp: 40 tablet, Rfl: 2   ondansetron (ZOFRAN) 4 MG tablet,  Take 1 tablet (4 mg total) by mouth every 8 (eight) hours as needed for nausea or vomiting., Disp: 30 tablet, Rfl: 3   OXcarbazepine (TRILEPTAL) 150 MG tablet, Take 300 mg by mouth 2 (two) times daily., Disp: , Rfl:    Pirfenidone 267 MG TABS, Take by mouth., Disp: , Rfl:    predniSONE (DELTASONE) 5 MG tablet, Take 5 mg by mouth daily., Disp: , Rfl:    RiTUXimab (RITUXAN IV), Inject 1,000 mg into the vein See admin instructions. 1,000 mg into the vein every 6 months, then two weeks later another 1,000 mg injection, Disp: , Rfl:    SOLU-CORTEF 100 MG injection, 100 mg., Disp: , Rfl:    tiotropium (SPIRIVA HANDIHALER) 18 MCG inhalation capsule, Place 1 capsule (18 mcg total) into inhaler and inhale daily., Disp: 30 capsule, Rfl: 12   traMADol (ULTRAM) 50 MG tablet, TAKE 1 TABLET BY MOUTH EVERY 6 HOURS AS NEEDED., Disp: 60 tablet, Rfl: 2   XARELTO 10 MG TABS tablet, TAKE 1 TABLET BY MOUTH EVERY DAY, Disp: 90 tablet, Rfl: 3   albuterol (VENTOLIN HFA) 108 (90 Base) MCG/ACT inhaler, INHALE 2 PUFFS INTO THE LUNGS EVERY 6 (SIX) HOURS AS NEEDED FOR WHEEZING OR SHORTNESS OF BREATH (COUGHING). (Patient not taking: Reported on 10/02/2023), Disp: 18 each, Rfl: 3   baclofen (LIORESAL) 10 MG tablet, TAKE 1 TABLET BY MOUTH  TWICE A DAY (Patient not taking: Reported on 10/02/2023), Disp: 180 tablet, Rfl: 1   benzonatate (TESSALON) 100 MG capsule, Take 1 capsule (100 mg total) by mouth 3 (three) times daily as needed for cough. (Patient not taking: Reported on 10/02/2023), Disp: 20 capsule, Rfl: 1   Oxcarbazepine (TRILEPTAL) 300 MG tablet, Take 300 mg by mouth daily., Disp: , Rfl:    predniSONE (DELTASONE) 10 MG tablet, TAKE 1/2 TABLET BY MOUTH DAILY, Disp: 15 tablet, Rfl: 5  Past Medical History: Past Medical History:  Diagnosis Date   Acute left hemiparesis on chronic left hemiparesis 04/12/2022   Acute respiratory distress syndrome (ARDS) due to COVID-19 virus (HCC) 03/27/2019   Acute respiratory failure with  hypoxia (HCC) 02/16/2020   Anemia of chronic disease 10/28/2015   Anxiety 07/12/2018   Arthritis    Ataxia 01/03/2016   Blind    Blindness of both eyes 04/12/2022   Chronic central neuropathic pain 10/02/2018   [facility]x [facility] 09/2017   Chronic coughing 09/10/2016   Chronic pain syndrome 07/12/2018   Chronic respiratory failure with hypoxia (HCC)    Chronic rhinitis 01/07/2019   COVID-19 02/04/2020   Decreased strength 10/08/2018   Proximal leg muscles   Depression    Depression with anxiety 01/03/2016   Devic's disease (HCC)    Dysesthesia 01/03/2016   Dyspnea on exertion 11/07/2018   Elevated BP without diagnosis of hypertension 08/28/2019   Report of [facility] nurse in December with normal SBPs but DBPs in low 90s    Eustachian tube dysfunction 11/18/2018   L>R. Followed by [facility].   Eustachian tube dysfunction, bilateral 09/18/2018   Eustachian tube dysfunction, left 11/13/2018   L>R. Followed by [facility].   GERD (gastroesophageal reflux disease)    Greater trochanteric bursitis of left hip 04/27/2020   Formatting of this note might be different from the original. Added automatically from request for surgery [id number]  Formatting of this note might be different from the original. Added automatically from request for surgery [id number]   Headache(784.0)    History of chicken pox 10/08/2018   History of COVID-19 02/16/2020   History of CVA (cerebrovascular accident)    History of kidney stones    History of pulmonary embolism 12/10/2018   Lupus Antiphosholipid (+). Patient on secondary prophylaxis with rivaroxaban 20 mg daily long-term.     Hypertension    Hypertrophic cardiomyopathy (HCC) 03/02/2020   Hyponatremia 11/25/2015   Impairment of balance 10/08/2018   Kidney stone 09/14/2022   Legal blindness 01/30/2014   Liver masses 04/13/2022   MRI Abdomen 11/06/22 [facility]: Focus of arterial phase hyperenhancement in the right hepatic lobe  is consistent with a  vascular shunt. No evidence of hepatic mass. Cholelithiasis. No radiographic evidence of cholecystitis or biliary  ductal dilatation.      PET Scan 04/10/22 by Pulmonology for work-up of Solitary Pulmonary nodule found "Multiple hypermetabolic hepatic mass lesions. Hepatic metastasis    Long-term corticosteroid use 10/08/2018   Lung nodule 05/24/2022   07/05/22 [facility] PET Scan: 1.  Interval decrease in size of a linear left lower lobe pulmonary nodule without radiotracer uptake, favored scarring in the setting of prior infection. No new pulmonary nodule.  2.  Waxing and waning nonspecific scattered hypermetabolic nodularity throughout both lungs with decreased metabolic activity of bilateral hilar lymph nodes and stable to slightly increased hepat   Lupus    Lupus anticoagulant disorder (HCC) 10/02/2018   Pulmonary Embolism 2017   Lupus cerebritis (HCC)  11/25/2015   Possible explanation of 11/25/2015 acute hyperintensity findings on Brain MRI.    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   Orthostatic dizziness, Recurrent 10/08/2018   Associated with medications   Otalgia 07/12/2018   Personal history of immunosupression therapy 10/08/2018   Chronic Azathioprine and prednisone therapy for Devic's disease   Pigmented skin lesion of uncertain nature 11/06/2018   Pneumonia 02/21/2020   Pneumonia of right lower lobe due to infectious organism 02/21/2020   Postmenopause 10/21/2020   Pulmonary embolism (HCC) 2017   Shortness of breath    due to medication   Skin lesion of chest wall 11/07/2018   SLE (systemic lupus erythematosus related syndrome) (HCC) 11/07/2018   Smith antibody positive 04/07/2019   Stroke (HCC) 2008   visually impaired   Stroke (HCC) 04/13/2022   Syncope    Transaminasemia 10/21/2020   Ureteral calculi 2018   Vaginal odor 06/29/2022   Vitamin D deficiency 10/06/2018    Tobacco Use: Social History   Tobacco Use  Smoking Status Never  Smokeless Tobacco Never     Labs: Review Flowsheet  More data exists      Latest Ref Rng & Units 05/06/2020 10/17/2021 04/12/2022 01/03/2023 08/07/2023  Labs for ITP Cardiac and Pulmonary Rehab  Cholestrol 0 - 200 - 236  - - -  LDL (calc) - - 124  - - -  Direct LDL 0 - 99 mg/dL - - 67  - -  HDL-C 35 - 70 - 68  - - -  Trlycerides 40 - 160 - 289  - - -  Hemoglobin A1c 4.0 - 5.6 % 5.1%  - 6.3  6.1  6.7   TCO2 22 - 32 mmol/L - - 23  - -    Capillary Blood Glucose: Lab Results  Component Value Date   GLUCAP 100 (H) 04/12/2022   GLUCAP 68 (L) 04/12/2022   GLUCAP 272 (H) 04/12/2022   GLUCAP 58 (L) 04/12/2022   GLUCAP 130 (H) 03/05/2021     Pulmonary Assessment Scores:  Pulmonary Assessment Scores     Row Name 10/02/23 0958         ADL UCSD   ADL Phase Entry     SOB Score total 68       CAT Score   CAT Score 24       mMRC Score   mMRC Score 4             UCSD: Self-administered rating of dyspnea associated with activities of daily living (ADLs) 6-point scale (0 = "not at all" to 5 = "maximal or unable to do because of breathlessness")  Scoring Scores range from 0 to 120.  Minimally important difference is 5 units  CAT: CAT can identify the health impairment of COPD patients and is better correlated with disease progression.  CAT has a scoring range of zero to 40. The CAT score is classified into four groups of low (less than 10), medium (10 - 20), high (21-30) and very high (31-40) based on the impact level of disease on health status. A CAT score over 10 suggests significant symptoms.  A worsening CAT score could be explained by an exacerbation, poor medication adherence, poor inhaler technique, or progression of COPD or comorbid conditions.  CAT MCID is 2 points  mMRC: mMRC (Modified Medical Research Council) Dyspnea Scale is used to assess the degree of baseline functional disability in patients of respiratory disease due to dyspnea. No minimal important difference is established. A  decrease in score of 1 point or greater is considered a positive change.   Pulmonary Function Assessment:  Pulmonary Function Assessment - 10/02/23 0957       Breath   Shortness of Breath Yes;Panic with Shortness of Breath;Fear of Shortness of Breath;Limiting activity             Exercise Target Goals: Exercise Program Goal: Individual exercise prescription set using results from initial 6 min walk test and THRR while considering  patient's activity barriers and safety.   Exercise Prescription Goal: Initial exercise prescription builds to 30-45 minutes a day of aerobic activity, 2-3 days per week.  Home exercise guidelines will be given to patient during program as part of exercise prescription that the participant will acknowledge.  Activity Barriers & Risk Stratification:  Activity Barriers & Cardiac Risk Stratification - 10/02/23 0959       Activity Barriers & Cardiac Risk Stratification   Activity Barriers Arthritis;Back Problems;Neck/Spine Problems;History of Falls;Balance Concerns;Shortness of Breath;Muscular Weakness;Deconditioning;Joint Problems             6 Minute Walk:  6 Minute Walk     Row Name 10/02/23 1102         6 Minute Walk   Phase Initial     Distance 485 feet     Walk Time 6 minutes     # of Rest Breaks 1  5:30-5:50     MPH 0.92     METS 2.05     RPE 12     Perceived Dyspnea  0     VO2 Peak 7.16     Symptoms No     Resting HR 107 bpm     Resting BP 122/74     Resting Oxygen Saturation  100 %     Exercise Oxygen Saturation  during 6 min walk 94 %     Max Ex. HR 122 bpm     Max Ex. BP 118/74     2 Minute Post BP 116/70       Interval HR   1 Minute HR 115     2 Minute HR 111     3 Minute HR 109     4 Minute HR 111     5 Minute HR 122     6 Minute HR 114     2 Minute Post HR 99     Interval Heart Rate? Yes       Interval Oxygen   Interval Oxygen? Yes     Baseline Oxygen Saturation % 100 %     1 Minute Oxygen Saturation % 100 %      1 Minute Liters of Oxygen 0 L     2 Minute Oxygen Saturation % 98 %     2 Minute Liters of Oxygen 0 L     3 Minute Oxygen Saturation % 99 %     3 Minute Liters of Oxygen 0 L     4 Minute Oxygen Saturation % 99 %     4 Minute Liters of Oxygen 0 L     5 Minute Oxygen Saturation % 94 %     5 Minute Liters of Oxygen 0 L     6 Minute Oxygen Saturation % 98 %     6 Minute Liters of Oxygen 0 L     2 Minute Post Oxygen Saturation % 100 %     2 Minute Post Liters of Oxygen 0 L  Oxygen Initial Assessment:  Oxygen Initial Assessment - 10/02/23 0937       Home Oxygen   Home Oxygen Device None    Sleep Oxygen Prescription None    Home Exercise Oxygen Prescription None    Home Resting Oxygen Prescription None      Initial 6 min Walk   Oxygen Used None      Program Oxygen Prescription   Program Oxygen Prescription None      Intervention   Short Term Goals To learn and exhibit compliance with exercise, home and travel O2 prescription;To learn and understand importance of maintaining oxygen saturations>88%;To learn and demonstrate proper use of respiratory medications;To learn and understand importance of monitoring SPO2 with pulse oximeter and demonstrate accurate use of the pulse oximeter.;To learn and demonstrate proper pursed lip breathing techniques or other breathing techniques.     Long  Term Goals Exhibits compliance with exercise, home  and travel O2 prescription;Verbalizes importance of monitoring SPO2 with pulse oximeter and return demonstration;Maintenance of O2 saturations>88%;Exhibits proper breathing techniques, such as pursed lip breathing or other method taught during program session;Compliance with respiratory medication;Demonstrates proper use of MDI's             Oxygen Re-Evaluation:  Oxygen Re-Evaluation     Row Name 10/02/23 0937             Goals/Expected Outcomes   Goals/Expected Outcomes Compliance and understanding of oxygen  saturation monitoring and breathing techniques to decrease shortness of breath.                Oxygen Discharge (Final Oxygen Re-Evaluation):  Oxygen Re-Evaluation - 10/02/23 0937       Goals/Expected Outcomes   Goals/Expected Outcomes Compliance and understanding of oxygen saturation monitoring and breathing techniques to decrease shortness of breath.             Initial Exercise Prescription:  Initial Exercise Prescription - 10/02/23 1100       Date of Initial Exercise RX and Referring Provider   Date 10/02/23    Referring Provider Marlowe Aschoff   Expected Discharge Date 12/26/23      NuStep   Level 1    SPM 50    Minutes 20    METs 1.5      Prescription Details   Frequency (times per week) 2    Duration Progress to 30 minutes of continuous aerobic without signs/symptoms of physical distress      Intensity   THRR 40-80% of Max Heartrate 66-132    Ratings of Perceived Exertion 11-13    Perceived Dyspnea 0-4      Progression   Progression Continue to progress workloads to maintain intensity without signs/symptoms of physical distress.      Resistance Training   Training Prescription Yes    Weight red bands    Reps 10-15             Perform Capillary Blood Glucose checks as needed.  Exercise Prescription Changes:   Exercise Comments:   Exercise Goals and Review:   Exercise Goals     Row Name 10/02/23 0936             Exercise Goals   Increase Physical Activity Yes       Intervention Provide advice, education, support and counseling about physical activity/exercise needs.;Develop an individualized exercise prescription for aerobic and resistive training based on initial evaluation findings, risk stratification, comorbidities and participant's personal goals.  Expected Outcomes Short Term: Attend rehab on a regular basis to increase amount of physical activity.;Long Term: Exercising regularly at least 3-5 days a week.;Long Term: Add  in home exercise to make exercise part of routine and to increase amount of physical activity.       Increase Strength and Stamina Yes       Intervention Provide advice, education, support and counseling about physical activity/exercise needs.;Develop an individualized exercise prescription for aerobic and resistive training based on initial evaluation findings, risk stratification, comorbidities and participant's personal goals.       Expected Outcomes Short Term: Increase workloads from initial exercise prescription for resistance, speed, and METs.;Short Term: Perform resistance training exercises routinely during rehab and add in resistance training at home;Long Term: Improve cardiorespiratory fitness, muscular endurance and strength as measured by increased METs and functional capacity ( )       Able to understand and use rate of perceived exertion (RPE) scale Yes       Intervention Provide education and explanation on how to use RPE scale       Expected Outcomes Short Term: Able to use RPE daily in rehab to express subjective intensity level;Long Term:  Able to use RPE to guide intensity level when exercising independently       Able to understand and use Dyspnea scale Yes       Intervention Provide education and explanation on how to use Dyspnea scale       Expected Outcomes Short Term: Able to use Dyspnea scale daily in rehab to express subjective sense of shortness of breath during exertion;Long Term: Able to use Dyspnea scale to guide intensity level when exercising independently       Knowledge and understanding of Target Heart Rate Range (THRR) Yes       Intervention Provide education and explanation of THRR including how the numbers were predicted and where they are located for reference       Expected Outcomes Short Term: Able to state/look up THRR;Long Term: Able to use THRR to govern intensity when exercising independently;Short Term: Able to use daily as guideline for intensity in  rehab       Understanding of Exercise Prescription Yes       Intervention Provide education, explanation, and written materials on patient's individual exercise prescription       Expected Outcomes Short Term: Able to explain program exercise prescription;Long Term: Able to explain home exercise prescription to exercise independently                Exercise Goals Re-Evaluation :  Exercise Goals Re-Evaluation     Row Name 10/02/23 [id number]             Exercise Goal Re-Evaluation   Exercise Goals Review Increase Physical Activity;Able to understand and use Dyspnea scale;Understanding of Exercise Prescription;Increase Strength and Stamina;Knowledge and understanding of Target Heart Rate Range (THRR);Able to understand and use rate of perceived exertion (RPE) scale       Comments Laelynn is scheduled to begin exercise on 1/28. Will monitor and progress as able.       Expected Outcomes Through exercise and rehab and home, the patient will decrease shortness of breath with daily activities and feel confident in carrying out an exercise regimen at home.                Discharge Exercise Prescription (Final Exercise Prescription Changes):   Nutrition:  Target Goals: Understanding of nutrition guidelines, daily intake of sodium 1500mg ,  cholesterol 200mg , calories 30% from fat and 7% or less from saturated fats, daily to have 5 or more servings of fruits and vegetables.  Biometrics:    Nutrition Therapy Plan and Nutrition Goals:   Nutrition Assessments:  MEDIFICTS Score Key: >=70 Need to make dietary changes  40-70 Heart Healthy Diet <= 40 Therapeutic Level Cholesterol Diet   Picture Your Plate Scores: <60 Unhealthy dietary pattern with much room for improvement. 41-50 Dietary pattern unlikely to meet recommendations for good health and room for improvement. 51-60 More healthful dietary pattern, with some room for improvement.  >60 Healthy dietary pattern, although there may  be some specific behaviors that could be improved.    Nutrition Goals Re-Evaluation:   Nutrition Goals Discharge (Final Nutrition Goals Re-Evaluation):   Psychosocial: Target Goals: Acknowledge presence or absence of significant depression and/or stress, maximize coping skills, provide positive support system. Participant is able to verbalize types and ability to use techniques and skills needed for reducing stress and depression.  Initial Review & Psychosocial Screening:  Initial Psych Review & Screening - 10/02/23 0939       Initial Review   Current issues with Current Anxiety/Panic;Current Depression    Comments Pt is on medication for anxiety and depression.      Family Dynamics   Good Support System? Yes    Comments children      Barriers   Psychosocial barriers to participate in program There are no identifiable barriers or psychosocial needs.      Screening Interventions   Interventions Encouraged to exercise             Quality of Life Scores:  Scores of 19 and below usually indicate a poorer quality of life in these areas.  A difference of  2-3 points is a clinically meaningful difference.  A difference of 2-3 points in the total score of the Quality of Life Index has been associated with significant improvement in overall quality of life, self-image, physical symptoms, and general health in studies assessing change in quality of life.  PHQ-9: Review Flowsheet  More data exists      10/02/2023 09/05/2023 08/07/2023 07/10/2023 05/30/2023  Depression screen PHQ 2/9  Decreased Interest 1 1 1 1  0  Down, Depressed, Hopeless 0 0 1 1 0  PHQ - 2 Score 1 1 2 2  0  Altered sleeping 1 1 1 1  0  Tired, decreased energy 1 1 1 1  0  Change in appetite 1 1 1 1  0  Feeling bad or failure about yourself  0 0 0 0 0  Trouble concentrating 0 3 1 1  0  Moving slowly or fidgety/restless 0 0 0 0 0  Suicidal thoughts 0 0 0 0 0  PHQ-9 Score 4 7 6 6  0  Difficult doing work/chores Not  difficult at all Somewhat difficult Somewhat difficult Somewhat difficult -   Interpretation of Total Score  Total Score Depression Severity:  1-4 = Minimal depression, 5-9 = Mild depression, 10-14 = Moderate depression, 15-19 = Moderately severe depression, 20-27 = Severe depression   Psychosocial Evaluation and Intervention:  Psychosocial Evaluation - 10/02/23 0940       Psychosocial Evaluation & Interventions   Interventions Stress management education;Relaxation education;Encouraged to exercise with the program and follow exercise prescription    Comments Ellisyn denies any psychosocial barriers at this time.    Expected Outcomes For Remiyah to participate in rehab free of psychosocial barriers.    Continue Psychosocial Services  Follow up required by  staff             Psychosocial Re-Evaluation:   Psychosocial Discharge (Final Psychosocial Re-Evaluation):   Education: Education Goals: Education classes will be provided on a weekly basis, covering required topics. Participant will state understanding/return demonstration of topics presented.  Learning Barriers/Preferences:  Learning Barriers/Preferences - 10/02/23 0956       Learning Barriers/Preferences   Learning Barriers Sight    Learning Preferences Skilled Demonstration;Individual Instruction;Audio             Education Topics: Know Your Numbers Group instruction that is supported by a PowerPoint presentation. Instructor discusses importance of knowing and understanding resting, exercise, and post-exercise oxygen saturation, heart rate, and blood pressure. Oxygen saturation, heart rate, blood pressure, rating of perceived exertion, and dyspnea are reviewed along with a normal range for these values.    Exercise for the Pulmonary Patient Group instruction that is supported by a PowerPoint presentation. Instructor discusses benefits of exercise, core components of exercise, frequency, duration, and intensity of  an exercise routine, importance of utilizing pulse oximetry during exercise, safety while exercising, and options of places to exercise outside of rehab.    MET Level  Group instruction provided by PowerPoint, verbal discussion, and written material to support subject matter. Instructor reviews what METs are and how to increase METs.    Pulmonary Medications Verbally interactive group education provided by instructor with focus on inhaled medications and proper administration.   Anatomy and Physiology of the Respiratory System Group instruction provided by PowerPoint, verbal discussion, and written material to support subject matter. Instructor reviews respiratory cycle and anatomical components of the respiratory system and their functions. Instructor also reviews differences in obstructive and restrictive respiratory diseases with examples of each.    Oxygen Safety Group instruction provided by PowerPoint, verbal discussion, and written material to support subject matter. There is an overview of "What is Oxygen" and "Why do we need it".  Instructor also reviews how to create a safe environment for oxygen use, the importance of using oxygen as prescribed, and the risks of noncompliance. There is a brief discussion on traveling with oxygen and resources the patient may utilize.   Oxygen Use Group instruction provided by PowerPoint, verbal discussion, and written material to discuss how supplemental oxygen is prescribed and different types of oxygen supply systems. Resources for more information are provided.    Breathing Techniques Group instruction that is supported by demonstration and informational handouts. Instructor discusses the benefits of pursed lip and diaphragmatic breathing and detailed demonstration on how to perform both.     Risk Factor Reduction Group instruction that is supported by a PowerPoint presentation. Instructor discusses the definition of a risk factor, different  risk factors for pulmonary disease, and how the heart and lungs work together.   Pulmonary Diseases Group instruction provided by PowerPoint, verbal discussion, and written material to support subject matter. Instructor gives an overview of the different type of pulmonary diseases. There is also a discussion on risk factors and symptoms as well as ways to manage the diseases.   Stress and Energy Conservation Group instruction provided by PowerPoint, verbal discussion, and written material to support subject matter. Instructor gives an overview of stress and the impact it can have on the body. Instructor also reviews ways to reduce stress. There is also a discussion on energy conservation and ways to conserve energy throughout the day.   Warning Signs and Symptoms Group instruction provided by PowerPoint, verbal discussion, and written material to support  subject matter. Instructor reviews warning signs and symptoms of stroke, heart attack, cold and flu. Instructor also reviews ways to prevent the spread of infection.   Other Education Group or individual verbal, written, or video instructions that support the educational goals of the pulmonary rehab program.    Knowledge Questionnaire Score:  Knowledge Questionnaire Score - 10/02/23 1052       Knowledge Questionnaire Score   Pre Score 15/18    Post Score --             Core Components/Risk Factors/Patient Goals at Admission:  Personal Goals and Risk Factors at Admission - 10/02/23 0956       Core Components/Risk Factors/Patient Goals on Admission   Improve shortness of breath with ADL's Yes    Intervention Provide education, individualized exercise plan and daily activity instruction to help decrease symptoms of SOB with activities of daily living.    Expected Outcomes Short Term: Improve cardiorespiratory fitness to achieve a reduction of symptoms when performing ADLs;Long Term: Be able to perform more ADLs without symptoms  or delay the onset of symptoms             Core Components/Risk Factors/Patient Goals Review:    Core Components/Risk Factors/Patient Goals at Discharge (Final Review):    ITP Comments: Dr. Mechele Collin is Medical Director for Pulmonary Rehab at [facility].

## 2023-10-08 ENCOUNTER — Encounter (HOSPITAL_COMMUNITY)
Admission: RE | Admit: 2023-10-08 | Discharge: 2023-10-08 | Disposition: A | Discharge status: Home / Self Care | Payer: Medicare Other | Source: Ambulatory Visit | Attending: Pulmonary Disease | Admitting: Pulmonary Disease

## 2023-10-08 DIAGNOSIS — J849 Interstitial pulmonary disease, unspecified: Secondary | ICD-10-CM | POA: Diagnosis not present

## 2023-10-08 NOTE — Progress Notes (Signed)
Daily Session Note  Patient Details  Name: Andrea Berry MRN: [id number] Date of Birth: 07-16-1966 Referring Provider:   Doristine Devoid Pulmonary Rehab Walk Test from 10/02/2023 in [facility] for Heart, Vascular, & Lung Health  Referring Provider Namen  [[name]]       Encounter Date: 10/08/2023  Check In:  Session Check In - 10/08/23 1039       Check-In   Supervising physician immediately available to respond to emergencies [facility] MD immediately available    Physician(s) Tereso Newcomer, NP    Location [facility]    Staff Present Essie Hart, RN, Doris Cheadle, MS, ACSM-CEP, Exercise Physiologist;Dashan Chizmar Sonnie Alamo, MS, ACSM-CEP, Exercise Physiologist    Virtual Visit No    Medication changes reported     No    Fall or balance concerns reported    No    Tobacco Cessation No Change    Warm-up and Cool-down Performed as group-led instruction    Resistance Training Performed Yes    VAD Patient? No    PAD/SET Patient? No      Pain Assessment   Currently in Pain? No/denies    Multiple Pain Sites No             Capillary Blood Glucose: No results found for this or any previous visit (from the past 24 hours).    Social History   Tobacco Use  Smoking Status Never  Smokeless Tobacco Never    Goals Met:  Proper associated with RPD/PD & O2 Sat Independence with exercise equipment Exercise tolerated well No report of concerns or symptoms today Strength training completed today  Goals Unmet:  Not Applicable  Comments: Service time is from 1025 to 1130.    Dr. Mechele Collin is Medical Director for Pulmonary Rehab at [facility].

## 2023-10-10 ENCOUNTER — Encounter (HOSPITAL_COMMUNITY)
Admission: RE | Admit: 2023-10-10 | Discharge: 2023-10-10 | Disposition: A | Discharge status: Home / Self Care | Payer: Medicare Other | Source: Ambulatory Visit | Attending: Pulmonary Disease | Admitting: Pulmonary Disease

## 2023-10-10 DIAGNOSIS — J849 Interstitial pulmonary disease, unspecified: Secondary | ICD-10-CM

## 2023-10-10 NOTE — Progress Notes (Signed)
Daily Session Note  Patient Details  Name: Andrea Berry MRN: [id number] Date of Birth: February 29, 1966 Referring Provider:   Doristine Devoid Pulmonary Rehab Walk Test from 10/02/2023 in [facility] for Heart, Vascular, & Lung Health  Referring Provider Namen  [[name]]       Encounter Date: 10/10/2023  Check In:  Session Check In - 10/10/23 1116       Check-In   Supervising physician immediately available to respond to emergencies [facility] MD immediately available    Physician(s) Edd Fabian, NP    [facility]    Staff Present Essie Hart, RN, Doris Cheadle, MS, ACSM-CEP, Exercise Physiologist;Casey Erin Sons BS, ACSM-CEP, Exercise Physiologist;Jetta Walker BS, ACSM-CEP, Exercise Physiologist    Virtual Visit No    Medication changes reported     No    Fall or balance concerns reported    No    Tobacco Cessation No Change    Warm-up and Cool-down Performed as group-led instruction    Resistance Training Performed Yes    VAD Patient? No    PAD/SET Patient? No      Pain Assessment   Currently in Pain? No/denies    Multiple Pain Sites No             Capillary Blood Glucose: No results found for this or any previous visit (from the past 24 hours).    Social History   Tobacco Use  Smoking Status Never  Smokeless Tobacco Never    Goals Met:  Exercise tolerated well No report of concerns or symptoms today Strength training completed today  Goals Unmet:  Not Applicable  Comments: Service time is from 1015 to 1135.    Dr. Mechele Collin is Medical Director for Pulmonary Rehab at [facility].

## 2023-10-15 ENCOUNTER — Encounter (HOSPITAL_COMMUNITY)
Admission: RE | Admit: 2023-10-15 | Discharge: 2023-10-15 | Disposition: A | Discharge status: Home / Self Care | Payer: Medicare Other | Source: Ambulatory Visit | Attending: Pulmonary Disease | Admitting: Pulmonary Disease

## 2023-10-15 VITALS — Wt 172.0 lb

## 2023-10-15 DIAGNOSIS — J849 Interstitial pulmonary disease, unspecified: Secondary | ICD-10-CM | POA: Insufficient documentation

## 2023-10-15 NOTE — Progress Notes (Signed)
 Daily Session Note  Patient Details  Name: Andrea Berry MRN: [id number] Date of Birth: 22-Nov-1965 Referring Provider:   Conrad Ports Pulmonary Rehab Walk Test from 10/02/2023 in [facility] for Heart, Vascular, & Lung Health  Referring Provider Namen  [[name]]       Encounter Date: 10/15/2023  Check In:  Session Check In - 10/15/23 1000       Check-In   Supervising physician immediately available to respond to emergencies [facility] immediately available    Physician(s) Josefa Beauvais, NP    [facility]    Staff Present Ronal Levin, RN, Maud Moats, MS, ACSM-CEP, Exercise Physiologist;Mafalda Mcginniss Claudene Idelia Aris BS, ACSM-CEP, Exercise Physiologist    Virtual Visit No    Medication changes reported     No    Fall or balance concerns reported    No    Tobacco Cessation No Change    Warm-up and Cool-down Performed as group-led instruction    Resistance Training Performed Yes    VAD Patient? No    PAD/SET Patient? No      Pain Assessment   Currently in Pain? No/denies    Multiple Pain Sites No             Capillary Blood Glucose: No results found for this or any previous visit (from the past 24 hours).   Exercise Prescription Changes - 10/15/23 1200       Response to Exercise   Blood Pressure (Admit) 120/70    Blood Pressure (Exercise) 128/80    Blood Pressure (Exit) 124/70    Heart Rate (Admit) 85 bpm    Heart Rate (Exercise) 86 bpm    Heart Rate (Exit) 93 bpm    Oxygen Saturation (Admit) 100 %    Oxygen Saturation (Exercise) 98 %    Oxygen Saturation (Exit) 99 %    Rating of Perceived Exertion (Exercise) 11    Perceived Dyspnea (Exercise) 0    Duration Continue with 30 min of aerobic exercise without signs/symptoms of physical distress.    Intensity THRR unchanged      Progression   Progression Continue to progress workloads to maintain intensity without signs/symptoms of physical distress.       Resistance Training   Training Prescription Yes    Weight red bands    Reps 10-15    Time 10 Minutes      NuStep   Level 1    SPM 76    Minutes 30    METs 1.6             Social History   Tobacco Use  Smoking Status Never  Smokeless Tobacco Never    Goals Met:  Proper associated with RPD/PD & O2 Sat Independence with exercise equipment Exercise tolerated well No report of concerns or symptoms today Strength training completed today  Goals Unmet:  Not Applicable  Comments: Service time is from 1010 to 1145.    Dr. Slater Staff is Medical Director for Pulmonary Rehab at [facility].

## 2023-10-17 ENCOUNTER — Encounter (HOSPITAL_COMMUNITY)
Admission: RE | Admit: 2023-10-17 | Discharge: 2023-10-17 | Disposition: A | Discharge status: Home / Self Care | Payer: Medicare Other | Source: Ambulatory Visit | Attending: Pulmonary Disease | Admitting: Pulmonary Disease

## 2023-10-17 DIAGNOSIS — J849 Interstitial pulmonary disease, unspecified: Secondary | ICD-10-CM

## 2023-10-17 NOTE — Progress Notes (Signed)
 Daily Session Note  Patient Details  Name: Andrea Berry MRN: [id number] Date of Birth: June 09, 1966 Referring Provider:   Conrad Ports Pulmonary Rehab Walk Test from 10/02/2023 in [facility] for Heart, Vascular, & Lung Health  Referring Provider Namen  [[name]]       Encounter Date: 10/17/2023  Check In:  Session Check In - 10/17/23 1029       Check-In   Supervising physician immediately available to respond to emergencies [facility] immediately available    Physician(s) Rosabel Mose, NP    [facility]    Staff Present Ronal Levin, RN, Maud Moats, MS, ACSM-CEP, Exercise Physiologist;Canisha Issac Claudene Idelia Aris BS, ACSM-CEP, Exercise Physiologist    Virtual Visit No    Medication changes reported     No    Fall or balance concerns reported    No    Tobacco Cessation No Change    Warm-up and Cool-down Performed as group-led instruction    Resistance Training Performed Yes    VAD Patient? No    PAD/SET Patient? No      Pain Assessment   Currently in Pain? No/denies    Multiple Pain Sites No             Capillary Blood Glucose: No results found for this or any previous visit (from the past 24 hours).    Social History   Tobacco Use  Smoking Status Never  Smokeless Tobacco Never    Goals Met:  Proper associated with RPD/PD & O2 Sat Independence with exercise equipment Exercise tolerated well No report of concerns or symptoms today Strength training completed today  Goals Unmet:  Not Applicable  Comments: Service time is from 1004 to 1131.    Dr. Slater Staff is Medical Director for Pulmonary Rehab at [facility].

## 2023-10-21 ENCOUNTER — Other Ambulatory Visit: Payer: Self-pay | Admitting: Family Medicine

## 2023-10-21 DIAGNOSIS — M8580 Other specified disorders of bone density and structure, unspecified site: Secondary | ICD-10-CM

## 2023-10-21 DIAGNOSIS — R0609 Other forms of dyspnea: Secondary | ICD-10-CM

## 2023-10-22 ENCOUNTER — Encounter (HOSPITAL_COMMUNITY)
Admission: RE | Admit: 2023-10-22 | Discharge: 2023-10-22 | Disposition: A | Discharge status: Home / Self Care | Payer: Medicare Other | Source: Ambulatory Visit | Attending: Pulmonary Disease | Admitting: Pulmonary Disease

## 2023-10-22 DIAGNOSIS — J849 Interstitial pulmonary disease, unspecified: Secondary | ICD-10-CM

## 2023-10-22 NOTE — Progress Notes (Signed)
Daily Session Note  Patient Details  Name: Margene Cherian MRN: [id number] Date of Birth: 1966-01-21 Referring Provider:   Doristine Devoid Pulmonary Rehab Walk Test from 10/02/2023 in [facility] for Heart, Vascular, & Lung Health  Referring Provider Namen  [[name]]       Encounter Date: 10/22/2023  Check In:  Session Check In - 10/22/23 1125       Check-In   Supervising physician immediately available to respond to emergencies [facility] immediately available    Physician(s) Hoover Browns, NP    [facility]    Staff Present Essie Hart, RN, Doris Cheadle, MS, ACSM-CEP, Exercise Physiologist;Casey Erin Sons BS, ACSM-CEP, Exercise Physiologist    Virtual Visit No    Medication changes reported     No    Fall or balance concerns reported    No    Tobacco Cessation No Change    Warm-up and Cool-down Performed as group-led instruction    Resistance Training Performed Yes    VAD Patient? No    PAD/SET Patient? No      Pain Assessment   Currently in Pain? No/denies    Multiple Pain Sites No             Capillary Blood Glucose: No results found for this or any previous visit (from the past 24 hours).    Social History   Tobacco Use  Smoking Status Never  Smokeless Tobacco Never    Goals Met:  Exercise tolerated well No report of concerns or symptoms today Strength training completed today  Goals Unmet:  Not Applicable  Comments: Service time is from 1013 to 1136.    Dr. Mechele Collin is Medical Director for Pulmonary Rehab at [facility].

## 2023-10-23 NOTE — Telephone Encounter (Signed)
I spoke with Andrea Berry about her question. She has been going to Pulmonary Rehab and feels like it is helping.

## 2023-10-24 ENCOUNTER — Encounter (HOSPITAL_COMMUNITY)
Admission: RE | Admit: 2023-10-24 | Discharge: 2023-10-24 | Disposition: A | Discharge status: Home / Self Care | Payer: Medicare Other | Source: Ambulatory Visit | Attending: Pulmonary Disease | Admitting: Pulmonary Disease

## 2023-10-24 DIAGNOSIS — J849 Interstitial pulmonary disease, unspecified: Secondary | ICD-10-CM

## 2023-10-24 NOTE — Progress Notes (Signed)
Daily Session Note  Patient Details  Name: Andrea Berry MRN: [id number] Date of Birth: 1966/02/24 Referring Provider:   Doristine Devoid Pulmonary Rehab Walk Test from 10/02/2023 in [facility] for Heart, Vascular, & Lung Health  Referring Provider Namen  [[name]]       Encounter Date: 10/24/2023  Check In:  Session Check In - 10/24/23 0957       Check-In   Supervising physician immediately available to respond to emergencies [facility] MD immediately available    Physician(s) Rise Paganini, NP    [facility]    Staff Present Essie Hart, RN, Doris Cheadle, MS, ACSM-CEP, Exercise Physiologist;Casey Katrinka Blazing, RT    Virtual Visit No    Medication changes reported     No    Fall or balance concerns reported    No    Tobacco Cessation No Change    Warm-up and Cool-down Performed as group-led instruction    Resistance Training Performed Yes    VAD Patient? No    PAD/SET Patient? No      Pain Assessment   Currently in Pain? No/denies    Multiple Pain Sites No             Capillary Blood Glucose: No results found for this or any previous visit (from the past 24 hours).    Social History   Tobacco Use  Smoking Status Never  Smokeless Tobacco Never    Goals Met:  Proper associated with RPD/PD & O2 Sat Exercise tolerated well No report of concerns or symptoms today Strength training completed today  Goals Unmet:  Not Applicable  Comments: Service time is from 1009 to 1140.    Dr. Mechele Collin is Medical Director for Pulmonary Rehab at [facility].

## 2023-10-25 ENCOUNTER — Telehealth: Payer: Self-pay

## 2023-10-25 DIAGNOSIS — I422 Other hypertrophic cardiomyopathy: Secondary | ICD-10-CM

## 2023-10-25 DIAGNOSIS — G8929 Other chronic pain: Secondary | ICD-10-CM

## 2023-10-25 DIAGNOSIS — I421 Obstructive hypertrophic cardiomyopathy: Secondary | ICD-10-CM

## 2023-10-25 DIAGNOSIS — F418 Other specified anxiety disorders: Secondary | ICD-10-CM

## 2023-10-25 DIAGNOSIS — Z86711 Personal history of pulmonary embolism: Secondary | ICD-10-CM

## 2023-10-25 DIAGNOSIS — J42 Unspecified chronic bronchitis: Secondary | ICD-10-CM

## 2023-10-25 DIAGNOSIS — K219 Gastro-esophageal reflux disease without esophagitis: Secondary | ICD-10-CM

## 2023-10-25 DIAGNOSIS — R0609 Other forms of dyspnea: Secondary | ICD-10-CM

## 2023-10-25 DIAGNOSIS — E2749 Other adrenocortical insufficiency: Secondary | ICD-10-CM

## 2023-10-25 DIAGNOSIS — M8580 Other specified disorders of bone density and structure, unspecified site: Secondary | ICD-10-CM

## 2023-10-25 DIAGNOSIS — G894 Chronic pain syndrome: Secondary | ICD-10-CM

## 2023-10-25 DIAGNOSIS — D6862 Lupus anticoagulant syndrome: Secondary | ICD-10-CM

## 2023-10-25 DIAGNOSIS — R519 Other chronic pain: Secondary | ICD-10-CM

## 2023-10-25 DIAGNOSIS — F419 Anxiety disorder, unspecified: Secondary | ICD-10-CM

## 2023-10-25 DIAGNOSIS — F411 Generalized anxiety disorder: Secondary | ICD-10-CM

## 2023-10-25 DIAGNOSIS — K5909 Other constipation: Secondary | ICD-10-CM

## 2023-10-25 NOTE — Telephone Encounter (Signed)
Received call from patient's caregiver, April, for multiple concerns.   Patient is needing new handicap placard completed. Printed off from [facility] website and placed in provider box for completion.  April wanted to make provider aware that pulmonologist started patient on Pirfenidone. They were unsure if patient would need to have labs drawn to regularly check liver function.  She is requesting referral to new Cardiologist, preferably within the [facility]. April feels that current cardiologist is not meeting patient's needs and would like to establish with another office. She is currently established with [facility], Dr. Sharyn Lull.  Lastly, they are requesting that all medications be transferred [facility] to [facility], so that patient can begin receiving pill packs. Called over to [employer] [facility] and they are going to request transfer of all active prescriptions from [facility].   Will forward to PCP.   Veronda Prude, RN

## 2023-10-28 ENCOUNTER — Other Ambulatory Visit: Payer: Self-pay | Admitting: Family Medicine

## 2023-10-28 MED ORDER — ATORVASTATIN CALCIUM 40 MG PO TABS
40.0000 mg | ORAL_TABLET | Freq: Every day | ORAL | 0 refills | Status: DC
  Filled 2024-01-20 – 2024-03-09 (×3): qty 90, 90d supply, fill #0
  Filled 2024-03-11 – 2024-03-23 (×2): qty 30, 30d supply, fill #0
  Filled 2024-04-06 – 2024-04-16 (×2): qty 30, 30d supply, fill #1
  Filled 2024-05-13 (×2): qty 30, 30d supply, fill #2

## 2023-10-28 MED ORDER — TIOTROPIUM BROMIDE MONOHYDRATE 18 MCG IN CAPS: 18.0000 ug | ORAL_CAPSULE | Freq: Every day | RESPIRATORY_TRACT | 0 refills | Status: DC

## 2023-10-28 MED ORDER — ALBUTEROL SULFATE HFA 108 (90 BASE) MCG/ACT IN AERS
2.0000 | INHALATION_SPRAY | Freq: Four times a day (QID) | RESPIRATORY_TRACT | 0 refills | Status: DC | PRN
Start: 2023-10-28 — End: 2024-01-27

## 2023-10-28 MED ORDER — DULOXETINE HCL 60 MG PO CPEP: 60.0000 mg | ORAL_CAPSULE | Freq: Every day | ORAL | 0 refills | Status: DC

## 2023-10-28 MED ORDER — ALENDRONATE SODIUM 70 MG PO TABS
70.0000 mg | ORAL_TABLET | ORAL | 3 refills | Status: AC
  Filled 2024-01-20 – 2024-03-23 (×5): qty 12, 84d supply, fill #0
  Filled 2024-07-21 – 2024-07-22 (×2): qty 12, 84d supply, fill #1

## 2023-10-28 MED ORDER — RIVAROXABAN 10 MG PO TABS: 10.0000 mg | ORAL_TABLET | Freq: Every day | ORAL | 3 refills | Status: DC

## 2023-10-28 MED ORDER — LORAZEPAM 0.5 MG PO TABS: 0.5000 mg | ORAL_TABLET | Freq: Two times a day (BID) | ORAL | 0 refills | Status: DC

## 2023-10-28 MED ORDER — AMITRIPTYLINE HCL 10 MG PO TABS: 10.0000 mg | ORAL_TABLET | Freq: Every day | ORAL | 0 refills | Status: DC

## 2023-10-28 MED ORDER — DILTIAZEM HCL ER COATED BEADS 180 MG PO CP24: 180.0000 mg | ORAL_CAPSULE | Freq: Every day | ORAL | 0 refills | Status: DC

## 2023-10-28 MED ORDER — LINACLOTIDE 145 MCG PO CAPS: 145.0000 ug | ORAL_CAPSULE | Freq: Every day | ORAL | 0 refills | Status: DC

## 2023-10-28 MED ORDER — ESOMEPRAZOLE MAGNESIUM 20 MG PO CPDR: 20.0000 mg | DELAYED_RELEASE_CAPSULE | Freq: Every day | ORAL | 0 refills | Status: DC

## 2023-10-28 MED ORDER — TRAMADOL HCL 50 MG PO TABS: 50.0000 mg | ORAL_TABLET | Freq: Four times a day (QID) | ORAL | 0 refills | Status: DC | PRN

## 2023-10-28 MED ORDER — PREDNISONE 10 MG PO TABS: 5.0000 mg | ORAL_TABLET | Freq: Every day | ORAL | 0 refills | Status: DC

## 2023-10-28 NOTE — Telephone Encounter (Signed)
1) Please let Andrea Berry know Pirfenidone's recommended monitoring of liver tests monthly for first 6 month, then every 3 months thereafter.   2) Referral to [facility] sent. If the cardiology office has not contacted patient in 5 business days, please call the cardiology office at [phone] to arrange the initial office visit.   3) The medications prescribed by Dr Andrea Berry were sent to the [facility].  These medications were not sent to [facility] because they are managed by her specialists, Including Gabapentin - Pain Clinic Trileptal - Neurology Rituximab - Neurology Azithromycin - Pulmonology Pirfenidone - Pulmonology  Andrea Berry will need to contact these offices to request transfer of these medication prescriptions.   4) Handicap placard for parking is at the [facility] front office for pickup

## 2023-10-28 NOTE — Telephone Encounter (Signed)
Attempted to reach patients caregiver April. I left the message that Dr. McDiarmid left on voicemail. Aquilla Solian, CMA

## 2023-10-29 ENCOUNTER — Encounter (HOSPITAL_COMMUNITY)
Admission: RE | Admit: 2023-10-29 | Discharge: 2023-10-29 | Disposition: A | Discharge status: Home / Self Care | Payer: Medicare Other | Source: Ambulatory Visit | Attending: Pulmonary Disease | Admitting: Pulmonary Disease

## 2023-10-29 VITALS — Wt 172.4 lb

## 2023-10-29 DIAGNOSIS — J849 Interstitial pulmonary disease, unspecified: Secondary | ICD-10-CM

## 2023-10-29 NOTE — Progress Notes (Signed)
Daily Session Note  Patient Details  Name: Andrea Berry MRN: [id number] Date of Birth: January 03, 1966 Referring Provider:   Doristine Berry Pulmonary Rehab Walk Test from 10/02/2023 in [facility] for Heart, Vascular, & Lung Health  Referring Provider Namen  [Andrea Berry]       Encounter Date: 10/29/2023  Check In:  Session Check In - 10/29/23 1101       Check-In   Supervising physician immediately available to respond to emergencies [facility] MD immediately available    Physician(s) Andrea Shadow, NP    [facility]    Staff Present Andrea Hart, RN, Andrea Cheadle, MS, ACSM-CEP, Exercise Physiologist;Andrea Berry Andrea Berry BS, ACSM-CEP, Exercise Physiologist;Andrea Peggye Pitt, MS, ACSM-CEP, Exercise Physiologist    Virtual Visit No    Medication changes reported     No    Fall or balance concerns reported    No    Tobacco Cessation No Change    Warm-up and Cool-down Performed as group-led instruction    Resistance Training Performed Yes    VAD Patient? No    PAD/SET Patient? No      Pain Assessment   Currently in Pain? No/denies    Multiple Pain Sites No             Capillary Blood Glucose: No results found for this or any previous visit (from the past 24 hours).   Exercise Prescription Changes - 10/29/23 1200       Response to Exercise   Blood Pressure (Admit) 108/72    Blood Pressure (Exercise) 138/70    Blood Pressure (Exit) 128/72    Heart Rate (Admit) 94 bpm    Heart Rate (Exercise) 87 bpm    Heart Rate (Exit) 86 bpm    Oxygen Saturation (Admit) 98 %    Oxygen Saturation (Exercise) 96 %    Oxygen Saturation (Exit) 98 %    Rating of Perceived Exertion (Exercise) 12    Perceived Dyspnea (Exercise) 0    Duration Continue with 30 min of aerobic exercise without signs/symptoms of physical distress.    Intensity THRR unchanged      Progression   Progression Continue to progress workloads to maintain  intensity without signs/symptoms of physical distress.      Resistance Training   Training Prescription Yes    Weight red bands    Reps 10-15    Time 10 Minutes      NuStep   Level 3    Minutes 15    METs 2      Track   Laps 14    Minutes 15    METs 3.15             Social History   Tobacco Use  Smoking Status Never  Smokeless Tobacco Never    Goals Met:  Proper associated with RPD/PD & O2 Sat Independence with exercise equipment Exercise tolerated well No report of concerns or symptoms today Strength training completed today  Goals Unmet:  Not Applicable  Comments: Service time is from 1005 to 1129.    Dr. Mechele Berry is Medical Director for Pulmonary Rehab at [facility].

## 2023-10-30 NOTE — Progress Notes (Signed)
Pulmonary Individual Treatment Plan  Patient Details  Name: Andrea Berry MRN: [id number] Date of Birth: Jan 22, 1966 Referring Provider:   Doristine Devoid Pulmonary Rehab Walk Test from 10/02/2023 in [facility]  Referring Provider Namen  [[name]]       Initial Encounter Date:  Flowsheet Row Pulmonary Rehab Walk Test from 10/02/2023 in [facility]  Date 10/02/23       Visit Diagnosis: Interstitial lung disease (HCC)  Patient's Home Medications on Admission:   Current Outpatient Medications:    albuterol (VENTOLIN HFA) 108 (90 Base) MCG/ACT inhaler, Inhale 2 puffs into the lungs every 6 (six) hours as needed for wheezing or shortness of breath., Disp: 18 each, Rfl: 0   [START ON 11/01/2023] alendronate (FOSAMAX) 70 MG tablet, Take 1 tablet (70 mg total) by mouth every Friday., Disp: 12 tablet, Rfl: 3   ALTAMIST SPRAY 0.65 % nasal spray, Place 2 sprays into the nose 2 (two) times daily., Disp: , Rfl:    amitriptyline (ELAVIL) 10 MG tablet, Take 1 tablet (10 mg total) by mouth at bedtime., Disp: 90 tablet, Rfl: 0   atorvastatin (LIPITOR) 40 MG tablet, Take 1 tablet (40 mg total) by mouth daily., Disp: 90 tablet, Rfl: 0   azithromycin (ZITHROMAX) 250 MG tablet, Take 1 tablet by mouth daily., Disp: , Rfl:    Cholecalciferol (VITAMIN D3) 50 MCG (2000 UT) TABS, Take 2 tablets by mouth daily., Disp: , Rfl:    diltiazem (CARDIZEM CD) 180 MG 24 hr capsule, Take 1 capsule (180 mg total) by mouth daily., Disp: 90 capsule, Rfl: 0   DULoxetine (CYMBALTA) 60 MG capsule, Take 1 capsule (60 mg total) by mouth daily., Disp: 90 capsule, Rfl: 0   esomeprazole (NEXIUM) 20 MG capsule, Take 1 capsule (20 mg total) by mouth daily at 12 noon., Disp: 90 capsule, Rfl: 0   gabapentin (NEURONTIN) 300 MG capsule, Take 2 capsules (600 mg total) by mouth 2 (two) times daily., Disp: , Rfl:     linaclotide (LINZESS) 145 MCG CAPS capsule, Take 1 capsule (145 mcg total) by mouth daily before breakfast., Disp: 90 capsule, Rfl: 0   LORazepam (ATIVAN) 0.5 MG tablet, Take 1 tablet (0.5 mg total) by mouth 2 (two) times daily., Disp: 40 tablet, Rfl: 0   ondansetron (ZOFRAN) 4 MG tablet, Take 1 tablet (4 mg total) by mouth every 8 (eight) hours as needed for nausea or vomiting., Disp: 30 tablet, Rfl: 3   OXcarbazepine (TRILEPTAL) 150 MG tablet, Take 300 mg by mouth 2 (two) times daily., Disp: , Rfl:    Oxcarbazepine (TRILEPTAL) 300 MG tablet, Take 300 mg by mouth daily., Disp: , Rfl:    Pirfenidone 267 MG TABS, Take by mouth., Disp: , Rfl:    predniSONE (DELTASONE) 10 MG tablet, Take 0.5 tablets (5 mg total) by mouth daily., Disp: 45 tablet, Rfl: 0   RiTUXimab (RITUXAN IV), Inject 1,000 mg into the vein See admin instructions. 1,000 mg into the vein every 6 months, then two weeks later another 1,000 mg injection, Disp: , Rfl:    rivaroxaban (XARELTO) 10 MG TABS tablet, Take 1 tablet (10 mg total) by mouth daily., Disp: 90 tablet, Rfl: 3   tiotropium (SPIRIVA HANDIHALER) 18 MCG inhalation capsule, Place 1 capsule (18 mcg total) into inhaler and inhale daily., Disp: 30 capsule, Rfl: 0   traMADol (ULTRAM) 50 MG tablet, Take 1 tablet (50 mg  total) by mouth every 6 (six) hours as needed., Disp: 28 tablet, Rfl: 0  Past Medical History: Past Medical History:  Diagnosis Date   Acute left hemiparesis on chronic left hemiparesis 04/12/2022   Acute respiratory distress syndrome (ARDS) due to COVID-19 virus (HCC) 03/27/2019   Acute respiratory failure with hypoxia (HCC) 02/16/2020   Anemia of chronic disease 10/28/2015   Anxiety 07/12/2018   Arthritis    Ataxia 01/03/2016   Blind    Blindness of both eyes 04/12/2022   Chronic central neuropathic pain 10/02/2018   [facility]x by [facility] 09/2017   Chronic coughing 09/10/2016   Chronic pain syndrome 07/12/2018   Chronic respiratory failure with hypoxia  (HCC)    Chronic rhinitis 01/07/2019   COVID-19 02/04/2020   Decreased strength 10/08/2018   Proximal leg muscles   Depression    Depression with anxiety 01/03/2016   Devic's disease (HCC)    Dysesthesia 01/03/2016   Dyspnea on exertion 11/07/2018   Elevated BP without diagnosis of hypertension 08/28/2019   Report of [facility] nurse in December with normal SBPs but DBPs in low 90s    Eustachian tube dysfunction 11/18/2018   L>R. Followed by [facility].   Eustachian tube dysfunction, bilateral 09/18/2018   Eustachian tube dysfunction, left 11/13/2018   L>R. Followed by [facility].   GERD (gastroesophageal reflux disease)    Greater trochanteric bursitis of left hip 04/27/2020   Formatting of this note might be different from the original. Added automatically from request for surgery [id number]  Formatting of this note might be different from the original. Added automatically from request for surgery [id number]   Headache(784.0)    History of chicken pox 10/08/2018   History of COVID-19 02/16/2020   History of CVA (cerebrovascular accident)    History of kidney stones    History of pulmonary embolism 12/10/2018   Lupus Antiphosholipid (+). Patient on secondary prophylaxis with rivaroxaban 20 mg daily long-term.     Hypertension    Hypertrophic cardiomyopathy (HCC) 03/02/2020   Hyponatremia 11/25/2015   Impairment of balance 10/08/2018   Kidney stone 09/14/2022   Legal blindness 01/30/2014   Liver masses 04/13/2022   MRI Abdomen 11/06/22 [facility]: Focus of arterial phase hyperenhancement in the right hepatic lobe  is consistent with a vascular shunt. No evidence of hepatic mass. Cholelithiasis. No radiographic evidence of cholecystitis or biliary  ductal dilatation.      PET Scan 04/10/22 by Pulmonology for work-up of Solitary Pulmonary nodule found "Multiple hypermetabolic hepatic mass lesions. Hepatic metastasis    Long-term corticosteroid use 10/08/2018   Lung nodule 05/24/2022   07/05/22  [facility] PET Scan: 1.  Interval decrease in size of a linear left lower lobe pulmonary nodule without radiotracer uptake, favored scarring in the setting of prior infection. No new pulmonary nodule.  2.  Waxing and waning nonspecific scattered hypermetabolic nodularity throughout both lungs with decreased metabolic activity of bilateral hilar lymph nodes and stable to slightly increased hepat   Lupus    Lupus anticoagulant disorder (HCC) 10/02/2018   Pulmonary Embolism 2017   Lupus cerebritis (HCC) 11/25/2015   Possible explanation of 11/25/2015 acute hyperintensity findings on Brain MRI.    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   Orthostatic dizziness, Recurrent 10/08/2018   Associated with medications   Otalgia 07/12/2018   Personal history of immunosupression therapy 10/08/2018   Chronic Azathioprine and prednisone therapy for Devic's disease   Pigmented skin lesion of uncertain nature 11/06/2018   Pneumonia 02/21/2020  Pneumonia of right lower lobe due to infectious organism 02/21/2020   Postmenopause 10/21/2020   Pulmonary embolism (HCC) 2017   Shortness of breath    due to medication   Skin lesion of chest wall 11/07/2018   SLE (systemic lupus erythematosus related syndrome) (HCC) 11/07/2018   Smith antibody positive 04/07/2019   Stroke (HCC) 2008   visually impaired   Stroke (HCC) 04/13/2022   Syncope    Transaminasemia 10/21/2020   Ureteral calculi 2018   Vaginal odor 06/29/2022   Vitamin D deficiency 10/06/2018    Tobacco Use: Social History   Tobacco Use  Smoking Status Never  Smokeless Tobacco Never    Labs: Review Flowsheet  More data exists      Latest Ref Rng & Units 05/06/2020 10/17/2021 04/12/2022 01/03/2023 08/07/2023  Labs for ITP Cardiac and Pulmonary Rehab  Cholestrol 0 - 200 - 236  - - -  LDL (calc) - - 124  - - -  Direct LDL 0 - 99 mg/dL - - 67  - -  HDL-C 35 - 70 - 68  - - -  Trlycerides 40 - 160 - 289  - - -  Hemoglobin A1c 4.0 - 5.6 % 5.1%  -  6.3  6.1  6.7   TCO2 22 - 32 mmol/L - - 23  - -    Capillary Blood Glucose: Lab Results  Component Value Date   GLUCAP 100 (H) 04/12/2022   GLUCAP 68 (L) 04/12/2022   GLUCAP 272 (H) 04/12/2022   GLUCAP 58 (L) 04/12/2022   GLUCAP 130 (H) 03/05/2021     Pulmonary Assessment Scores:  Pulmonary Assessment Scores     Row Name 10/02/23 0958         ADL UCSD   ADL Phase Entry     SOB Score total 68       CAT Score   CAT Score 24       mMRC Score   mMRC Score 4             UCSD: Self-administered rating of dyspnea associated with activities of daily living (ADLs) 6-point scale (0 = "not at all" to 5 = "maximal or unable to do because of breathlessness")  Scoring Scores range from 0 to 120.  Minimally important difference is 5 units  CAT: CAT can identify the health impairment of COPD patients and is better correlated with disease progression.  CAT has a scoring range of zero to 40. The CAT score is classified into four groups of low (less than 10), medium (10 - 20), high (21-30) and very high (31-40) based on the impact level of disease on health status. A CAT score over 10 suggests significant symptoms.  A worsening CAT score could be explained by an exacerbation, poor medication adherence, poor inhaler technique, or progression of COPD or comorbid conditions.  CAT MCID is 2 points  mMRC: mMRC (Modified Medical Research Council) Dyspnea Scale is used to assess the degree of baseline functional disability in patients of respiratory disease due to dyspnea. No minimal important difference is established. A decrease in score of 1 point or greater is considered a positive change.   Pulmonary Function Assessment:  Pulmonary Function Assessment - 10/02/23 0957       Breath   Shortness of Breath Yes;Panic with Shortness of Breath;Fear of Shortness of Breath;Limiting activity             Exercise Target Goals: Exercise Program Goal: Individual exercise prescription  set  using results from initial 6 min walk test and THRR while considering  patient's activity barriers and safety.   Exercise Prescription Goal: Initial exercise prescription builds to 30-45 minutes a day of aerobic activity, 2-3 days per week.  Home exercise guidelines will be given to patient during program as part of exercise prescription that the participant will acknowledge.  Activity Barriers & Risk Stratification:  Activity Barriers & Cardiac Risk Stratification - 10/02/23 0959       Activity Barriers & Cardiac Risk Stratification   Activity Barriers Arthritis;Back Problems;Neck/Spine Problems;History of Falls;Balance Concerns;Shortness of Breath;Muscular Weakness;Deconditioning;Joint Problems             6 Minute Walk:  6 Minute Walk     Row Name 10/02/23 1102         6 Minute Walk   Phase Initial     Distance 485 feet     Walk Time 6 minutes     # of Rest Breaks 1  5:30-5:50     MPH 0.92     METS 2.05     RPE 12     Perceived Dyspnea  0     VO2 Peak 7.16     Symptoms No     Resting HR 107 bpm     Resting BP 122/74     Resting Oxygen Saturation  100 %     Exercise Oxygen Saturation  during 6 min walk 94 %     Max Ex. HR 122 bpm     Max Ex. BP 118/74     2 Minute Post BP 116/70       Interval HR   1 Minute HR 115     2 Minute HR 111     3 Minute HR 109     4 Minute HR 111     5 Minute HR 122     6 Minute HR 114     2 Minute Post HR 99     Interval Heart Rate? Yes       Interval Oxygen   Interval Oxygen? Yes     Baseline Oxygen Saturation % 100 %     1 Minute Oxygen Saturation % 100 %     1 Minute Liters of Oxygen 0 L     2 Minute Oxygen Saturation % 98 %     2 Minute Liters of Oxygen 0 L     3 Minute Oxygen Saturation % 99 %     3 Minute Liters of Oxygen 0 L     4 Minute Oxygen Saturation % 99 %     4 Minute Liters of Oxygen 0 L     5 Minute Oxygen Saturation % 94 %     5 Minute Liters of Oxygen 0 L     6 Minute Oxygen Saturation % 98 %     6  Minute Liters of Oxygen 0 L     2 Minute Post Oxygen Saturation % 100 %     2 Minute Post Liters of Oxygen 0 L              Oxygen Initial Assessment:  Oxygen Initial Assessment - 10/02/23 0937       Home Oxygen   Home Oxygen Device None    Sleep Oxygen Prescription None    Home Exercise Oxygen Prescription None    Home Resting Oxygen Prescription None      Initial 6 min Walk   Oxygen Used None  Program Oxygen Prescription   Program Oxygen Prescription None      Intervention   Short Term Goals To learn and exhibit compliance with exercise, home and travel O2 prescription;To learn and understand importance of maintaining oxygen saturations>88%;To learn and demonstrate proper use of respiratory medications;To learn and understand importance of monitoring SPO2 with pulse oximeter and demonstrate accurate use of the pulse oximeter.;To learn and demonstrate proper pursed lip breathing techniques or other breathing techniques.     Long  Term Goals Exhibits compliance with exercise, home  and travel O2 prescription;Verbalizes importance of monitoring SPO2 with pulse oximeter and return demonstration;Maintenance of O2 saturations>88%;Exhibits proper breathing techniques, such as pursed lip breathing or other method taught during program session;Compliance with respiratory medication;Demonstrates proper use of MDI's             Oxygen Re-Evaluation:  Oxygen Re-Evaluation     Row Name 10/02/23 0937 10/21/23 1216           Program Oxygen Prescription   Program Oxygen Prescription -- None        Home Oxygen   Home Oxygen Device -- None      Sleep Oxygen Prescription -- None      Home Exercise Oxygen Prescription -- None      Home Resting Oxygen Prescription -- None        Goals/Expected Outcomes   Short Term Goals -- To learn and exhibit compliance with exercise, home and travel O2 prescription;To learn and understand importance of maintaining oxygen saturations>88%;To  learn and demonstrate proper use of respiratory medications;To learn and understand importance of monitoring SPO2 with pulse oximeter and demonstrate accurate use of the pulse oximeter.;To learn and demonstrate proper pursed lip breathing techniques or other breathing techniques.       Long  Term Goals -- Exhibits compliance with exercise, home  and travel O2 prescription;Verbalizes importance of monitoring SPO2 with pulse oximeter and return demonstration;Maintenance of O2 saturations>88%;Exhibits proper breathing techniques, such as pursed lip breathing or other method taught during program session;Compliance with respiratory medication;Demonstrates proper use of MDI's      Goals/Expected Outcomes Compliance and understanding of oxygen saturation monitoring and breathing techniques to decrease shortness of breath. Compliance and understanding of oxygen saturation monitoring and breathing techniques to decrease shortness of breath.               Oxygen Discharge (Final Oxygen Re-Evaluation):  Oxygen Re-Evaluation - 10/21/23 1216       Program Oxygen Prescription   Program Oxygen Prescription None      Home Oxygen   Home Oxygen Device None    Sleep Oxygen Prescription None    Home Exercise Oxygen Prescription None    Home Resting Oxygen Prescription None      Goals/Expected Outcomes   Short Term Goals To learn and exhibit compliance with exercise, home and travel O2 prescription;To learn and understand importance of maintaining oxygen saturations>88%;To learn and demonstrate proper use of respiratory medications;To learn and understand importance of monitoring SPO2 with pulse oximeter and demonstrate accurate use of the pulse oximeter.;To learn and demonstrate proper pursed lip breathing techniques or other breathing techniques.     Long  Term Goals Exhibits compliance with exercise, home  and travel O2 prescription;Verbalizes importance of monitoring SPO2 with pulse oximeter and return  demonstration;Maintenance of O2 saturations>88%;Exhibits proper breathing techniques, such as pursed lip breathing or other method taught during program session;Compliance with respiratory medication;Demonstrates proper use of MDI's    Goals/Expected Outcomes Compliance and  understanding of oxygen saturation monitoring and breathing techniques to decrease shortness of breath.             Initial Exercise Prescription:  Initial Exercise Prescription - 10/02/23 1100       Date of Initial Exercise RX and Referring Provider   Date 10/02/23    Referring Provider Marlowe Aschoff   Expected Discharge Date 12/26/23      NuStep   Level 1    SPM 50    Minutes 20    METs 1.5      Prescription Details   Frequency (times per week) 2    Duration Progress to 30 minutes of continuous aerobic without signs/symptoms of physical distress      Intensity   THRR 40-80% of Max Heartrate 66-132    Ratings of Perceived Exertion 11-13    Perceived Dyspnea 0-4      Progression   Progression Continue to progress workloads to maintain intensity without signs/symptoms of physical distress.      Resistance Training   Training Prescription Yes    Weight red bands    Reps 10-15             Perform Capillary Blood Glucose checks as needed.  Exercise Prescription Changes:   Exercise Prescription Changes     Row Name 10/15/23 1200 10/29/23 1200           Response to Exercise   Blood Pressure (Admit) 120/70 108/72      Blood Pressure (Exercise) 128/80 138/70      Blood Pressure (Exit) 124/70 128/72      Heart Rate (Admit) 85 bpm 94 bpm      Heart Rate (Exercise) 86 bpm 87 bpm      Heart Rate (Exit) 93 bpm 86 bpm      Oxygen Saturation (Admit) 100 % 98 %      Oxygen Saturation (Exercise) 98 % 96 %      Oxygen Saturation (Exit) 99 % 98 %      Rating of Perceived Exertion (Exercise) 11 12      Perceived Dyspnea (Exercise) 0 0      Duration Continue with 30 min of aerobic exercise  without signs/symptoms of physical distress. Continue with 30 min of aerobic exercise without signs/symptoms of physical distress.      Intensity THRR unchanged THRR unchanged        Progression   Progression Continue to progress workloads to maintain intensity without signs/symptoms of physical distress. Continue to progress workloads to maintain intensity without signs/symptoms of physical distress.        Resistance Training   Training Prescription Yes Yes      Weight red bands red bands      Reps 10-15 10-15      Time 10 Minutes 10 Minutes        NuStep   Level 1 3      SPM 76 --      Minutes 30 15      METs 1.6 2        Track   Laps -- 14      Minutes -- 15      METs -- 3.15               Exercise Comments:   Exercise Comments     Row Name 10/08/23 1507           Exercise Comments Pt completed first day of exercise. Michelle exercised  for 25 min on the Nustep. She averaged 1.1 METs at level 1 on the Nustep. She was unable to perform the warmup due to arriving late but did the cooldown. Marcelino Duster needs 1 on 1 assistance due to her vision impairment. Discussed progression with her and how I will eventually progress her. Marcelino Duster voiced understanding.                Exercise Goals and Review:   Exercise Goals     Row Name 10/02/23 0936             Exercise Goals   Increase Physical Activity Yes       Intervention Provide advice, education, support and counseling about physical activity/exercise needs.;Develop an individualized exercise prescription for aerobic and resistive training based on initial evaluation findings, risk stratification, comorbidities and participant's personal goals.       Expected Outcomes Short Term: Attend rehab on a regular basis to increase amount of physical activity.;Long Term: Exercising regularly at least 3-5 days a week.;Long Term: Add in home exercise to make exercise part of routine and to increase amount of physical  activity.       Increase Strength and Stamina Yes       Intervention Provide advice, education, support and counseling about physical activity/exercise needs.;Develop an individualized exercise prescription for aerobic and resistive training based on initial evaluation findings, risk stratification, comorbidities and participant's personal goals.       Expected Outcomes Short Term: Increase workloads from initial exercise prescription for resistance, speed, and METs.;Short Term: Perform resistance training exercises routinely during rehab and add in resistance training at home;Long Term: Improve cardiorespiratory fitness, muscular endurance and strength as measured by increased METs and functional capacity ( )       Able to understand and use rate of perceived exertion (RPE) scale Yes       Intervention Provide education and explanation on how to use RPE scale       Expected Outcomes Short Term: Able to use RPE daily in rehab to express subjective intensity level;Long Term:  Able to use RPE to guide intensity level when exercising independently       Able to understand and use Dyspnea scale Yes       Intervention Provide education and explanation on how to use Dyspnea scale       Expected Outcomes Short Term: Able to use Dyspnea scale daily in rehab to express subjective sense of shortness of breath during exertion;Long Term: Able to use Dyspnea scale to guide intensity level when exercising independently       Knowledge and understanding of Target Heart Rate Range (THRR) Yes       Intervention Provide education and explanation of THRR including how the numbers were predicted and where they are located for reference       Expected Outcomes Short Term: Able to state/look up THRR;Long Term: Able to use THRR to govern intensity when exercising independently;Short Term: Able to use daily as guideline for intensity in rehab       Understanding of Exercise Prescription Yes       Intervention Provide  education, explanation, and written materials on patient's individual exercise prescription       Expected Outcomes Short Term: Able to explain program exercise prescription;Long Term: Able to explain home exercise prescription to exercise independently                Exercise Goals Re-Evaluation :  Exercise Goals Re-Evaluation  Row Name 10/02/23 6045 10/21/23 1211           Exercise Goal Re-Evaluation   Exercise Goals Review Increase Physical Activity;Able to understand and use Dyspnea scale;Understanding of Exercise Prescription;Increase Strength and Stamina;Knowledge and understanding of Target Heart Rate Range (THRR);Able to understand and use rate of perceived exertion (RPE) scale Increase Physical Activity;Able to understand and use Dyspnea scale;Understanding of Exercise Prescription;Increase Strength and Stamina;Knowledge and understanding of Target Heart Rate Range (THRR);Able to understand and use rate of perceived exertion (RPE) scale      Comments Ethelyne is scheduled to begin exercise on 1/28. Will monitor and progress as able. Marcelino Duster has completed 4 exercise sessions. She exercises for 30 min on the Nustep. Michelle averages 1.9 METs at level 2 on the Nustep. It is too soon to notate any discernable progressions. Marcelino Duster will be difficult to progress due to her visual impairment, but she seems determined to improve her functional capacity. Will continue to monitor and progress as able.      Expected Outcomes Through exercise and rehab and home, the patient will decrease shortness of breath with daily activities and feel confident in carrying out an exercise regimen at home. Through exercise and rehab and home, the patient will decrease shortness of breath with daily activities and feel confident in carrying out an exercise regimen at home.               Discharge Exercise Prescription (Final Exercise Prescription Changes):  Exercise Prescription Changes - 10/29/23 1200        Response to Exercise   Blood Pressure (Admit) 108/72    Blood Pressure (Exercise) 138/70    Blood Pressure (Exit) 128/72    Heart Rate (Admit) 94 bpm    Heart Rate (Exercise) 87 bpm    Heart Rate (Exit) 86 bpm    Oxygen Saturation (Admit) 98 %    Oxygen Saturation (Exercise) 96 %    Oxygen Saturation (Exit) 98 %    Rating of Perceived Exertion (Exercise) 12    Perceived Dyspnea (Exercise) 0    Duration Continue with 30 min of aerobic exercise without signs/symptoms of physical distress.    Intensity THRR unchanged      Progression   Progression Continue to progress workloads to maintain intensity without signs/symptoms of physical distress.      Resistance Training   Training Prescription Yes    Weight red bands    Reps 10-15    Time 10 Minutes      NuStep   Level 3    Minutes 15    METs 2      Track   Laps 14    Minutes 15    METs 3.15             Nutrition:  Target Goals: Understanding of nutrition guidelines, daily intake of sodium 1500mg , cholesterol 200mg , calories 30% from fat and 7% or less from saturated fats, daily to have 5 or more servings of fruits and vegetables.  Biometrics:    Nutrition Therapy Plan and Nutrition Goals:   Nutrition Assessments:  MEDIFICTS Score Key: >=70 Need to make dietary changes  40-70 Heart Healthy Diet <= 40 Therapeutic Level Cholesterol Diet   Picture Your Plate Scores: <40 Unhealthy dietary pattern with much room for improvement. 41-50 Dietary pattern unlikely to meet recommendations for good health and room for improvement. 51-60 More healthful dietary pattern, with some room for improvement.  >60 Healthy dietary pattern, although there may be some  specific behaviors that could be improved.    Nutrition Goals Re-Evaluation:   Nutrition Goals Discharge (Final Nutrition Goals Re-Evaluation):   Psychosocial: Target Goals: Acknowledge presence or absence of significant depression and/or stress,  maximize coping skills, provide positive support system. Participant is able to verbalize types and ability to use techniques and skills needed for reducing stress and depression.  Initial Review & Psychosocial Screening:  Initial Psych Review & Screening - 10/02/23 0939       Initial Review   Current issues with Current Anxiety/Panic;Current Depression    Comments Pt is on medication for anxiety and depression.      Family Dynamics   Good Support System? Yes    Comments children      Barriers   Psychosocial barriers to participate in program There are no identifiable barriers or psychosocial needs.      Screening Interventions   Interventions Encouraged to exercise             Quality of Life Scores:  Scores of 19 and below usually indicate a poorer quality of life in these areas.  A difference of  2-3 points is a clinically meaningful difference.  A difference of 2-3 points in the total score of the Quality of Life Index has been associated with significant improvement in overall quality of life, self-image, physical symptoms, and general health in studies assessing change in quality of life.  PHQ-9: Review Flowsheet  More data exists      10/02/2023 09/05/2023 08/07/2023 07/10/2023 05/30/2023  Depression screen PHQ 2/9  Decreased Interest 1 1 1 1  0  Down, Depressed, Hopeless 0 0 1 1 0  PHQ - 2 Score 1 1 2 2  0  Altered sleeping 1 1 1 1  0  Tired, decreased energy 1 1 1 1  0  Change in appetite 1 1 1 1  0  Feeling bad or failure about yourself  0 0 0 0 0  Trouble concentrating 0 3 1 1  0  Moving slowly or fidgety/restless 0 0 0 0 0  Suicidal thoughts 0 0 0 0 0  PHQ-9 Score 4 7 6 6  0  Difficult doing work/chores Not difficult at all Somewhat difficult Somewhat difficult Somewhat difficult -   Interpretation of Total Score  Total Score Depression Severity:  1-4 = Minimal depression, 5-9 = Mild depression, 10-14 = Moderate depression, 15-19 = Moderately severe depression,  20-27 = Severe depression   Psychosocial Evaluation and Intervention:  Psychosocial Evaluation - 10/02/23 0940       Psychosocial Evaluation & Interventions   Interventions Stress management education;Relaxation education;Encouraged to exercise with the program and follow exercise prescription    Comments Anesia denies any psychosocial barriers at this time.    Expected Outcomes For Chanler to participate in rehab free of psychosocial barriers.    Continue Psychosocial Services  Follow up required by staff             Psychosocial Re-Evaluation:  Psychosocial Re-Evaluation     Row Name 10/25/23 0929             Psychosocial Re-Evaluation   Current issues with Current Depression;Current Anxiety/Panic       Comments Marcelino Duster feels her depression is stable at this time. She states coming to class has helped her anxiety and depression. She is compliant with taking her psychotropic meds. No new barriers or concerns at this time.       Expected Outcomes For Marcelino Duster to participate in PR with less depression and  anxiety       Interventions Encouraged to attend Pulmonary Rehabilitation for the exercise       Continue Psychosocial Services  Follow up required by staff                Psychosocial Discharge (Final Psychosocial Re-Evaluation):  Psychosocial Re-Evaluation - 10/25/23 0929       Psychosocial Re-Evaluation   Current issues with Current Depression;Current Anxiety/Panic    Comments Marcelino Duster feels her depression is stable at this time. She states coming to class has helped her anxiety and depression. She is compliant with taking her psychotropic meds. No new barriers or concerns at this time.    Expected Outcomes For Marcelino Duster to participate in PR with less depression and anxiety    Interventions Encouraged to attend Pulmonary Rehabilitation for the exercise    Continue Psychosocial Services  Follow up required by staff             Education: Education Goals:  Education classes will be provided on a weekly basis, covering required topics. Participant will state understanding/return demonstration of topics presented.  Learning Barriers/Preferences:  Learning Barriers/Preferences - 10/02/23 0956       Learning Barriers/Preferences   Learning Barriers Sight    Learning Preferences Skilled Demonstration;Individual Instruction;Audio             Education Topics: Know Your Numbers Group instruction that is supported by a PowerPoint presentation. Instructor discusses importance of knowing and understanding resting, exercise, and post-exercise oxygen saturation, heart rate, and blood pressure. Oxygen saturation, heart rate, blood pressure, rating of perceived exertion, and dyspnea are reviewed along with a normal range for these values.    Exercise for the Pulmonary Patient Group instruction that is supported by a PowerPoint presentation. Instructor discusses benefits of exercise, core components of exercise, frequency, duration, and intensity of an exercise routine, importance of utilizing pulse oximetry during exercise, safety while exercising, and options of places to exercise outside of rehab.    MET Level  Group instruction provided by PowerPoint, verbal discussion, and written material to support subject matter. Instructor reviews what METs are and how to increase METs.    Pulmonary Medications Verbally interactive group education provided by instructor with focus on inhaled medications and proper administration.   Anatomy and Physiology of the Respiratory System Group instruction provided by PowerPoint, verbal discussion, and written material to support subject matter. Instructor reviews respiratory cycle and anatomical components of the respiratory system and their functions. Instructor also reviews differences in obstructive and restrictive respiratory diseases with examples of each.    Oxygen Safety Group instruction provided by  PowerPoint, verbal discussion, and written material to support subject matter. There is an overview of "What is Oxygen" and "Why do we need it".  Instructor also reviews how to create a safe environment for oxygen use, the importance of using oxygen as prescribed, and the risks of noncompliance. There is a brief discussion on traveling with oxygen and resources the patient may utilize.   Oxygen Use Group instruction provided by PowerPoint, verbal discussion, and written material to discuss how supplemental oxygen is prescribed and different types of oxygen supply systems. Resources for more information are provided.    Breathing Techniques Group instruction that is supported by demonstration and informational handouts. Instructor discusses the benefits of pursed lip and diaphragmatic breathing and detailed demonstration on how to perform both.     Risk Factor Reduction Group instruction that is supported by a PowerPoint presentation. Instructor discusses the definition  of a risk factor, different risk factors for pulmonary disease, and how the heart and lungs work together. Flowsheet Row PULMONARY REHAB OTHER RESPIRATORY from 10/24/2023 in [facility] for Heart, Vascular, & Lung Health  Date 10/24/23  Educator EP  Instruction Review Code 1- Verbalizes Understanding       Pulmonary Diseases Group instruction provided by PowerPoint, verbal discussion, and written material to support subject matter. Instructor gives an overview of the different type of pulmonary diseases. There is also a discussion on risk factors and symptoms as well as ways to manage the diseases.   Stress and Energy Conservation Group instruction provided by PowerPoint, verbal discussion, and written material to support subject matter. Instructor gives an overview of stress and the impact it can have on the body. Instructor also reviews ways to reduce stress. There is also a discussion on energy  conservation and ways to conserve energy throughout the day. Flowsheet Row PULMONARY REHAB OTHER RESPIRATORY from 10/10/2023 in [facility] for Heart, Vascular, & Lung Health  Date 10/10/23  Educator RN  Instruction Review Code 1- Verbalizes Understanding       Warning Signs and Symptoms Group instruction provided by PowerPoint, verbal discussion, and written material to support subject matter. Instructor reviews warning signs and symptoms of stroke, heart attack, cold and flu. Instructor also reviews ways to prevent the spread of infection. Flowsheet Row PULMONARY REHAB OTHER RESPIRATORY from 10/17/2023 in [facility] for Heart, Vascular, & Lung Health  Date 10/17/23  Educator RN  Instruction Review Code 1- Verbalizes Understanding       Other Education Group or individual verbal, written, or video instructions that support the educational goals of the pulmonary rehab program.    Knowledge Questionnaire Score:  Knowledge Questionnaire Score - 10/02/23 1052       Knowledge Questionnaire Score   Pre Score 15/18    Post Score --             Core Components/Risk Factors/Patient Goals at Admission:  Personal Goals and Risk Factors at Admission - 10/02/23 0956       Core Components/Risk Factors/Patient Goals on Admission   Improve shortness of breath with ADL's Yes    Intervention Provide education, individualized exercise plan and daily activity instruction to help decrease symptoms of SOB with activities of daily living.    Expected Outcomes Short Term: Improve cardiorespiratory fitness to achieve a reduction of symptoms when performing ADLs;Long Term: Be able to perform more ADLs without symptoms or delay the onset of symptoms             Core Components/Risk Factors/Patient Goals Review:   Goals and Risk Factor Review     Row Name 10/25/23 0932             Core Components/Risk Factors/Patient Goals Review    Personal Goals Review Improve shortness of breath with ADL's;Develop more efficient breathing techniques such as purse lipped breathing and diaphragmatic breathing and practicing self-pacing with activity.       Review Monthly review of goals are as follows.Marland KitchenMarland KitchenGoal progressing for developing more efficient breathing techniques such as purse lipped breathing and diaphragmatic breathing; and practicing self-pacing with activity. Goal progressing for improving shortness of breath with ADL's. Marcelino Duster is currently exercising on the Nustep for 30 minutes. Her sats have remained >88% on RA while exercising. We will continue monitoring Michelle's progress throughout the program.       Expected Outcomes To improve  shortness of breath with ADL's and develop more efficient breathing techniques such as purse lipped breathing and diaphragmatic breathing; and practicing self-pacing with activity.                Core Components/Risk Factors/Patient Goals at Discharge (Final Review):   Goals and Risk Factor Review - 10/25/23 0932       Core Components/Risk Factors/Patient Goals Review   Personal Goals Review Improve shortness of breath with ADL's;Develop more efficient breathing techniques such as purse lipped breathing and diaphragmatic breathing and practicing self-pacing with activity.    Review Monthly review of goals are as follows.Marland KitchenMarland KitchenGoal progressing for developing more efficient breathing techniques such as purse lipped breathing and diaphragmatic breathing; and practicing self-pacing with activity. Goal progressing for improving shortness of breath with ADL's. Marcelino Duster is currently exercising on the Nustep for 30 minutes. Her sats have remained >88% on RA while exercising. We will continue monitoring Michelle's progress throughout the program.    Expected Outcomes To improve shortness of breath with ADL's and develop more efficient breathing techniques such as purse lipped breathing and diaphragmatic  breathing; and practicing self-pacing with activity.             ITP Comments: Pt is making expected progress toward Pulmonary Rehab goals after completing 7 session(s). Recommend continued exercise, life style modification, education, and utilization of breathing techniques to increase stamina and strength, while also decreasing shortness of breath with exertion.  Dr. Mechele Collin is Medical Director for Pulmonary Rehab at [facility].

## 2023-10-31 ENCOUNTER — Encounter (HOSPITAL_COMMUNITY)
Admission: RE | Admit: 2023-10-31 | Discharge: 2023-10-31 | Disposition: A | Discharge status: Home / Self Care | Payer: Medicare Other | Source: Ambulatory Visit | Attending: Pulmonary Disease | Admitting: Pulmonary Disease

## 2023-10-31 DIAGNOSIS — J849 Interstitial pulmonary disease, unspecified: Secondary | ICD-10-CM | POA: Diagnosis not present

## 2023-10-31 NOTE — Progress Notes (Signed)
Daily Session Note  Patient Details  Name: Andrea Berry MRN: [id number] Date of Birth: 08-18-1966 Referring Provider:   Doristine Devoid Pulmonary Rehab Walk Test from 10/02/2023 in [facility] for Heart, Vascular, & Lung Health  Referring Provider Namen  [[name]]       Encounter Date: 10/31/2023  Check In:  Session Check In - 10/31/23 1021       Check-In   Supervising physician immediately available to respond to emergencies [facility] MD immediately available    Physician(s) Joni Reining, NP    Location [facility]    Staff Present Essie Hart, RN, Doris Cheadle, MS, ACSM-CEP, Exercise Physiologist;Branden Vine Erin Sons BS, ACSM-CEP, Exercise Physiologist    Virtual Visit No    Medication changes reported     No    Fall or balance concerns reported    No    Tobacco Cessation No Change    Warm-up and Cool-down Performed as group-led instruction    Resistance Training Performed Yes    VAD Patient? No    PAD/SET Patient? No      Pain Assessment   Currently in Pain? No/denies    Multiple Pain Sites No             Capillary Blood Glucose: No results found for this or any previous visit (from the past 24 hours).    Social History   Tobacco Use  Smoking Status Never  Smokeless Tobacco Never    Goals Met:  Proper associated with RPD/PD & O2 Sat Independence with exercise equipment Exercise tolerated well No report of concerns or symptoms today Strength training completed today  Goals Unmet:  Not Applicable  Comments: Service time is from 1008 to 1124.    Dr. Mechele Collin is Medical Director for Pulmonary Rehab at [facility].

## 2023-11-05 ENCOUNTER — Encounter (HOSPITAL_COMMUNITY)
Admission: RE | Admit: 2023-11-05 | Discharge: 2023-11-05 | Disposition: A | Discharge status: Home / Self Care | Payer: Medicare Other | Source: Ambulatory Visit | Attending: Pulmonary Disease | Admitting: Pulmonary Disease

## 2023-11-05 DIAGNOSIS — J849 Interstitial pulmonary disease, unspecified: Secondary | ICD-10-CM | POA: Diagnosis not present

## 2023-11-05 NOTE — Progress Notes (Signed)
 Daily Session Note  Patient Details  Name: Andrea Berry MRN: [id number] Date of Birth: 21-Jan-1966 Referring Provider:   Doristine Devoid Pulmonary Rehab Walk Test from 10/02/2023 in [facility] for Heart, Vascular, & Lung Health  Referring Provider Namen  [[name]]       Encounter Date: 11/05/2023  Check In:  Session Check In - 11/05/23 0950       Check-In   Supervising physician immediately available to respond to emergencies [facility] MD immediately available    Physician(s) Robin Searing, NP    Location [facility]    Staff Present Essie Hart, RN, Doris Cheadle, MS, ACSM-CEP, Exercise Physiologist;Casey Katrinka Blazing, RT    Virtual Visit No    Medication changes reported     No    Fall or balance concerns reported    No    Tobacco Cessation No Change    Warm-up and Cool-down Performed as group-led instruction    Resistance Training Performed Yes    VAD Patient? No    PAD/SET Patient? No      Pain Assessment   Currently in Pain? No/denies    Multiple Pain Sites No             Capillary Blood Glucose: No results found for this or any previous visit (from the past 24 hours).    Social History   Tobacco Use  Smoking Status Never  Smokeless Tobacco Never    Goals Met:  Proper associated with RPD/PD & O2 Sat Exercise tolerated well No report of concerns or symptoms today Strength training completed today  Goals Unmet:  Not Applicable  Comments: Service time is from 1024 to 1137.    Dr. Mechele Collin is Medical Director for Pulmonary Rehab at [facility].

## 2023-11-07 ENCOUNTER — Encounter: Payer: Self-pay | Admitting: Family Medicine

## 2023-11-07 ENCOUNTER — Ambulatory Visit: Payer: Medicare Other | Admitting: Family Medicine

## 2023-11-07 ENCOUNTER — Other Ambulatory Visit (HOSPITAL_COMMUNITY): Payer: Self-pay

## 2023-11-07 VITALS — BP 130/70 | HR 106 | Ht 61.0 in | Wt 176.5 lb

## 2023-11-07 DIAGNOSIS — I10 Essential (primary) hypertension: Secondary | ICD-10-CM | POA: Diagnosis not present

## 2023-11-07 DIAGNOSIS — M8580 Other specified disorders of bone density and structure, unspecified site: Secondary | ICD-10-CM

## 2023-11-07 DIAGNOSIS — Z79899 Other long term (current) drug therapy: Secondary | ICD-10-CM

## 2023-11-07 DIAGNOSIS — J849 Interstitial pulmonary disease, unspecified: Secondary | ICD-10-CM

## 2023-11-07 DIAGNOSIS — M792 Neuralgia and neuritis, unspecified: Secondary | ICD-10-CM

## 2023-11-07 DIAGNOSIS — D6862 Lupus anticoagulant syndrome: Secondary | ICD-10-CM

## 2023-11-07 DIAGNOSIS — E041 Nontoxic single thyroid nodule: Secondary | ICD-10-CM

## 2023-11-07 DIAGNOSIS — Z23 Encounter for immunization: Secondary | ICD-10-CM

## 2023-11-07 DIAGNOSIS — J479 Bronchiectasis, uncomplicated: Secondary | ICD-10-CM

## 2023-11-07 DIAGNOSIS — G8929 Other chronic pain: Secondary | ICD-10-CM

## 2023-11-07 DIAGNOSIS — E2749 Other adrenocortical insufficiency: Secondary | ICD-10-CM

## 2023-11-07 DIAGNOSIS — F19982 Other psychoactive substance use, unspecified with psychoactive substance-induced sleep disorder: Secondary | ICD-10-CM

## 2023-11-07 DIAGNOSIS — Z7952 Long term (current) use of systemic steroids: Secondary | ICD-10-CM

## 2023-11-07 DIAGNOSIS — G8194 Hemiplegia, unspecified affecting left nondominant side: Secondary | ICD-10-CM | POA: Diagnosis not present

## 2023-11-07 DIAGNOSIS — D509 Iron deficiency anemia, unspecified: Secondary | ICD-10-CM

## 2023-11-07 DIAGNOSIS — R519 Headache, unspecified: Secondary | ICD-10-CM

## 2023-11-07 DIAGNOSIS — I421 Obstructive hypertrophic cardiomyopathy: Secondary | ICD-10-CM

## 2023-11-07 MED ORDER — TRAZODONE HCL 50 MG PO TABS
25.0000 mg | ORAL_TABLET | Freq: Every evening | ORAL | 3 refills | Status: DC | PRN
  Filled 2023-11-07: qty 30, 30d supply, fill #0

## 2023-11-07 NOTE — Patient Instructions (Signed)
 Start Trazodone 50 mg tablet, a half to one whole tablet about an hor before betime to help with sleep.  Thyroid US to follow up a thyroid cyst that was found incidentally on a CAT scan.  This is to make sure it is not a cancer.  Radiology will call to set up appointment for the ultrasound.  Dr Riti Rollyson see what happened with the cardiology referral.   Try popping our ears by holding your nose and blowing againt the closed nose.  If you do it right you will hear a popping sound in your head.   The family meidicne nurses will call you to set up five monthly lab visits for checking your liver for damage from the Pirfenidone.

## 2023-11-07 NOTE — Progress Notes (Unsigned)
 Andrea Berry is {Pc accompanied by:5710} Sources of clinical information for visit is/are {Information source:60032}. Nursing assessment for this office visit was reviewed with the patient for accuracy and revision.     Previous Report(s) Reviewed: {Outside review:15817}     10/02/2023   10:50 AM  Depression screen PHQ 2/9  Decreased Interest 1  Down, Depressed, Hopeless 0  PHQ - 2 Score 1  Altered sleeping 1  Tired, decreased energy 1  Change in appetite 1  Feeling bad or failure about yourself  0  Trouble concentrating 0  Moving slowly or fidgety/restless 0  Suicidal thoughts 0  PHQ-9 Score 4  Difficult doing work/chores Not difficult at all   Flowsheet Row Pulmonary Rehab Walk Test from 10/02/2023 in [facility] for Heart, Vascular, & Lung Health Office Visit from 09/05/2023 in [facility] [place] Office Visit from 08/07/2023 in [facility] [place]  Thoughts that you would be better off dead, or of hurting yourself in some way Not at all Not at all Not at all  PHQ-9 Total Score 4 7 6           11/05/2023    9:50 AM 10/31/2023   10:22 AM 10/29/2023   11:01 AM 10/24/2023    9:58 AM 10/22/2023   11:25 AM  Fall Risk   Falls in the past year? 1 1 1 1 1   Number falls in past yr: 0 0 0 0 0  Injury with Fall? 1 1 1 1 1   Risk for fall due to : History of fall(s);Impaired balance/gait;Impaired mobility;Impaired vision;Other (Comment) History of fall(s);Impaired balance/gait;Impaired mobility;Impaired vision History of fall(s);Impaired balance/gait;Impaired mobility;Impaired vision History of fall(s);Impaired balance/gait;Impaired mobility;Impaired vision History of fall(s);Impaired balance/gait;Impaired mobility;Impaired vision  Risk for fall due to: Comment High Falls Risk      Follow up Falls evaluation completed Falls evaluation completed;Falls prevention  discussed Falls evaluation completed;Falls prevention discussed Falls evaluation completed Falls evaluation completed;Falls prevention discussed       10/02/2023   10:50 AM 09/05/2023    4:02 PM 08/07/2023    9:12 AM  PHQ9 SCORE ONLY  PHQ-9 Total Score 4 7 6     There are no preventive care reminders to display for this patient.  Health Maintenance Due  Topic Date Due   COVID-19 Vaccine (6 - 2024-25 season) 05/12/2023      History/P.E. limitations: {exam; limitations ed:60112}  There are no preventive care reminders to display for this patient. There are no preventive care reminders to display for this patient.  Health Maintenance Due  Topic Date Due   COVID-19 Vaccine (6 - 2024-25 season) 05/12/2023     No chief complaint on file.    --------------------------------------------------------------------------------------------------------------------------------------------- Visit Problem List with A/P  No problem-specific Assessment & Plan notes found for this encounter.

## 2023-11-08 ENCOUNTER — Encounter: Payer: Self-pay | Admitting: Family Medicine

## 2023-11-08 DIAGNOSIS — F19982 Other psychoactive substance use, unspecified with psychoactive substance-induced sleep disorder: Secondary | ICD-10-CM | POA: Insufficient documentation

## 2023-11-08 LAB — HEPATIC FUNCTION PANEL
ALT: 9 [IU]/L (ref 0–32)
AST: 14 [IU]/L (ref 0–40)
Albumin: 4.4 g/dL (ref 3.8–4.9)
Alkaline Phosphatase: 86 [IU]/L (ref 44–121)
Bilirubin Total: <0.2 mg/dL (ref 0.0–1.2)
Bilirubin, Direct: <0.08 mg/dL (ref 0.00–0.40)
Total Protein: 6.9 g/dL (ref 6.0–8.5)

## 2023-11-08 NOTE — Assessment & Plan Note (Signed)
 Andrea Berry already had a thyroid ultrasound 05/01/23 thru [facility] that found a benign thyroid cysts which did not require further follow up. No palpalbe thyroid nodule on exam. No further work up planned.   Period monitoring thyroid exam.

## 2023-11-08 NOTE — Assessment & Plan Note (Signed)
 Established problem. Stable. Patient is at goal of of no systemic embolism nor venous thrombosis. No signs of bleeding Continue rivaroxaban

## 2023-11-08 NOTE — Assessment & Plan Note (Signed)
 Established problem that has improved and has meet goal of decreased pulmonary symptoms.  The improvement began after Dr Candie Echevaria [facility]) began Ms Melvyn Neth on Pirfenidone (for ILD), daily azithromycin and start of pulm rehab.   Continue current medication regiment.

## 2023-11-08 NOTE — Assessment & Plan Note (Signed)
 Established problem. Stable. Patient is at goal of adequate cortisol to prevent insufficiency symptoms. Taking alendronate Continue current prednisone 5 mg daily.  Appointment with Ednocrinology 12/30/23 with Dr Kathreen Cosier ([facility])

## 2023-11-08 NOTE — Assessment & Plan Note (Signed)
 Established problem. ?Stable. Only mild weakness in left hand.  ?Continue current medications and other regiments. ? ?

## 2023-11-08 NOTE — Assessment & Plan Note (Signed)
 Established problem. Stable. Patient is at goal of being able to perform ALDs without interference. No signs of complications, medication side effects, or red flags.  Andrea Berry requests change of cardiologist to [facility] cardiology for care of her hypertrophic cardiomyopathy. Referral request submitted for [facility] cardiology consultation.   Continue diltiazem.

## 2023-11-08 NOTE — Assessment & Plan Note (Signed)
 New problem Acute on chronic insomnia with DFA and EMA worsening wince start of Pirifenidone No pain or orthopnea in bed.  Plan Trazodone 25-50 mg at bedtime prn sleep.  RTC 3 months to reassess.

## 2023-11-08 NOTE — Assessment & Plan Note (Addendum)
    Latest Ref Rng & Units 05/30/2023    5:30 PM 05/06/2023    4:00 PM 04/08/2023    7:09 AM  CBC  WBC 3.4 - 10.8 x10E3/uL 10.5  7.1  8.1   Hemoglobin 11.1 - 15.9 g/dL 86.5  78.4  69.6   Hematocrit 34.0 - 46.6 % 37.1  34.5  36.1   Platelets 150 - 450 x10E3/uL 351  407  367    MCV in 71 To 75 range in last year Slightly wide RDW on several CBCs  Need to recheck CBC and iron studies/ferritin

## 2023-11-08 NOTE — Assessment & Plan Note (Addendum)
 Established problem. Stable. Patient denies worsening of her dyspnea since start of Pirfenidone. She has noted increase feeling of internal agitation, especially at bedtime.  While She has previously haddifficulty falling asleep and maintaining sleep, it is significant worse now.   Andrea Berry plans to continue the Piffenidone.  Will check LFTs today and send to Dr Odette Horns office. Plan schedule five monthly LFT checks for the recommended initial monitoring therapy for Pirfenidone and send to Dr Odette Horns office.

## 2023-11-08 NOTE — Assessment & Plan Note (Signed)
 Established problem that has improved and has meet goal of significant decrease in frequency and intensity of headaches.  Andrea Berry made a self-change in Amitriptyline 10 mg to a daily prn basis for headache.  She is satisfied with this regiment Continue current medication regiment.

## 2023-11-11 ENCOUNTER — Telehealth: Payer: Self-pay

## 2023-11-11 NOTE — Telephone Encounter (Signed)
 Contacted the patient to schedule her 5 monthly lab visit. I have scheduled them all for her patient agreed that each date and time was good.

## 2023-11-11 NOTE — Telephone Encounter (Signed)
-----   Message from [facility] sent at 11/08/2023  8:53 AM EST ----- Regarding: Scheduling monthly Please contact Ms Herring to schedule for 5 future monthly lab visits for hepatic panel draws.   Future monthly hepatic labs ordered.

## 2023-11-12 ENCOUNTER — Other Ambulatory Visit: Payer: Medicare Other

## 2023-11-12 ENCOUNTER — Encounter (HOSPITAL_COMMUNITY)
Admission: RE | Admit: 2023-11-12 | Discharge: 2023-11-12 | Disposition: A | Discharge status: Home / Self Care | Payer: Medicare Other | Source: Ambulatory Visit | Attending: Pulmonary Disease | Admitting: Pulmonary Disease

## 2023-11-12 VITALS — Wt 178.8 lb

## 2023-11-12 DIAGNOSIS — J849 Interstitial pulmonary disease, unspecified: Secondary | ICD-10-CM | POA: Insufficient documentation

## 2023-11-12 NOTE — Progress Notes (Signed)
 Daily Session Note  Patient Details  Name: Andrea Berry MRN: [id number] Date of Birth: 12-10-1965 Referring Provider:   Doristine Devoid Pulmonary Rehab Walk Test from 10/02/2023 in [facility]  Referring Provider Namen  [[name]]       Encounter Date: 11/12/2023  Check In:  Session Check In - 11/12/23 1027       Check-In   Supervising physician immediately available to respond to emergencies [facility] MD immediately available    Physician(s) Bernadene Person, NP    Location [facility]    Staff Present Essie Hart, RN, Doris Cheadle, MS, ACSM-CEP, Exercise Physiologist;Casey Erin Sons BS, ACSM-CEP, Exercise Physiologist    Virtual Visit No    Medication changes reported     No    Fall or balance concerns reported    No    Tobacco Cessation No Change    Warm-up and Cool-down Performed as group-led instruction    Resistance Training Performed Yes    VAD Patient? No    PAD/SET Patient? No      Pain Assessment   Currently in Pain? No/denies    Multiple Pain Sites No             Capillary Blood Glucose: No results found for this or any previous visit (from the past 24 hours).   Exercise Prescription Changes - 11/12/23 1100       Response to Exercise   Blood Pressure (Admit) 124/70    Blood Pressure (Exercise) 136/88    Blood Pressure (Exit) 130/68    Heart Rate (Admit) 93 bpm    Heart Rate (Exercise) 106 bpm    Heart Rate (Exit) 103 bpm    Oxygen Saturation (Admit) 98 %    Oxygen Saturation (Exercise) 96 %    Oxygen Saturation (Exit) 97 %    Rating of Perceived Exertion (Exercise) 11    Perceived Dyspnea (Exercise) 0    Duration Continue with 30 min of aerobic exercise without signs/symptoms of physical distress.    Intensity THRR unchanged      Progression   Progression Continue to progress workloads to maintain intensity without signs/symptoms of physical distress.       Resistance Training   Training Prescription Yes    Weight red bands    Reps 10-15    Time 10 Minutes      NuStep   Level 3    Minutes 15    METs 2      Track   Laps 15    Minutes 15    METs 2             Social History   Tobacco Use  Smoking Status Never  Smokeless Tobacco Never    Goals Met:  Exercise tolerated well No report of concerns or symptoms today Strength training completed today  Goals Unmet:  Not Applicable  Comments: Service time is from 1010 to 1133.    Dr. Mechele Collin is Medical Director for Pulmonary Rehab at [facility].

## 2023-11-14 ENCOUNTER — Other Ambulatory Visit (HOSPITAL_COMMUNITY): Payer: Self-pay

## 2023-11-14 ENCOUNTER — Other Ambulatory Visit: Payer: Self-pay

## 2023-11-14 ENCOUNTER — Encounter (HOSPITAL_COMMUNITY): Payer: Medicare Other

## 2023-11-14 DIAGNOSIS — F19982 Other psychoactive substance use, unspecified with psychoactive substance-induced sleep disorder: Secondary | ICD-10-CM

## 2023-11-14 MED ORDER — TRAZODONE HCL 50 MG PO TABS: 25.0000 mg | ORAL_TABLET | Freq: Every evening | ORAL | 3 refills | Status: DC | PRN

## 2023-11-14 NOTE — Telephone Encounter (Signed)
 Patient's caregiver, April, calls nurse line regarding Trazodone prescription.   This was sent to the [facility] pharmacy on 11/07/23. Called and verified that patient had not picked up medication and then cancelled prescription.   Resending to [facility] [employer].   Veronda Prude, RN

## 2023-11-19 ENCOUNTER — Encounter (HOSPITAL_COMMUNITY)
Admission: RE | Admit: 2023-11-19 | Discharge: 2023-11-19 | Disposition: A | Discharge status: Home / Self Care | Payer: Medicare Other | Source: Ambulatory Visit | Attending: Pulmonary Disease | Admitting: Pulmonary Disease

## 2023-11-19 ENCOUNTER — Telehealth: Payer: Self-pay

## 2023-11-19 DIAGNOSIS — J849 Interstitial pulmonary disease, unspecified: Secondary | ICD-10-CM

## 2023-11-19 NOTE — Telephone Encounter (Signed)
 Received call from patient's caregiver regarding continued issues with sleep.   Patient has been taking trazodone since last week and is continuing to have issues.   She has been taking 50 mg dosage. She is able to get to sleep, however, is unable to sleep through the night.   They are asking if dosage can be increased or if there is another alternative that patient can try.   Will forward to PCP for further advisement.   Veronda Prude, RN

## 2023-11-19 NOTE — Progress Notes (Signed)
 Daily Session Note  Patient Details  Name: Michal Strzelecki MRN: [id number] Date of Birth: [birth year] Referring Provider:   Doristine Devoid Pulmonary Rehab Walk Test from 10/02/2023 in [facility]  Referring Provider Namen  [[name]]       Encounter Date: 11/19/2023  Check In:  Session Check In - 11/19/23 1003       Check-In   Supervising physician immediately available to respond to emergencies [facility] MD immediately available    Physician(s) Tereso Newcomer, PA    [facility]    Staff Present Raford Pitcher, MS, ACSM-CEP, Exercise Physiologist;Minette Manders Erin Sons BS, ACSM-CEP, Exercise Physiologist;Johnny Hale Bogus, MS, Exercise Physiologist    Virtual Visit No    Medication changes reported     No    Fall or balance concerns reported    No    Tobacco Cessation No Change    Warm-up and Cool-down Performed as group-led instruction    Resistance Training Performed Yes    VAD Patient? No    PAD/SET Patient? No      Pain Assessment   Currently in Pain? No/denies    Multiple Pain Sites No             Capillary Blood Glucose: No results found for this or any previous visit (from the past 24 hours).    Social History   Tobacco Use  Smoking Status Never  Smokeless Tobacco Never    Goals Met:  Proper associated with RPD/PD & O2 Sat Independence with exercise equipment Exercise tolerated well No report of concerns or symptoms today Strength training completed today  Goals Unmet:  Not Applicable  Comments: Service time is from 1007 to 1130.    Dr. Mechele Collin is Medical Director for Pulmonary Rehab at [facility].

## 2023-11-21 ENCOUNTER — Encounter (HOSPITAL_COMMUNITY)
Admission: RE | Admit: 2023-11-21 | Discharge: 2023-11-21 | Disposition: A | Discharge status: Home / Self Care | Payer: Medicare Other | Source: Ambulatory Visit | Attending: Pulmonary Disease | Admitting: Pulmonary Disease

## 2023-11-21 ENCOUNTER — Other Ambulatory Visit: Payer: Self-pay

## 2023-11-21 ENCOUNTER — Ambulatory Visit (INDEPENDENT_AMBULATORY_CARE_PROVIDER_SITE_OTHER): Admitting: Student

## 2023-11-21 VITALS — BP 128/80 | HR 87 | Wt 177.4 lb

## 2023-11-21 DIAGNOSIS — J849 Interstitial pulmonary disease, unspecified: Secondary | ICD-10-CM

## 2023-11-21 DIAGNOSIS — J441 Chronic obstructive pulmonary disease with (acute) exacerbation: Secondary | ICD-10-CM | POA: Diagnosis not present

## 2023-11-21 MED ORDER — METHYLPREDNISOLONE ACETATE 80 MG/ML IJ SUSP
80.0000 mg | Freq: Once | INTRAMUSCULAR | Status: AC
  Administered 2023-11-21: 80 mg via INTRAMUSCULAR

## 2023-11-21 MED ORDER — DOXYCYCLINE HYCLATE 100 MG PO TABS
100.0000 mg | ORAL_TABLET | Freq: Two times a day (BID) | ORAL | 0 refills | Status: AC
Start: 2023-11-21 — End: 2023-11-28

## 2023-11-21 MED ORDER — PREDNISONE 20 MG PO TABS: 40.0000 mg | ORAL_TABLET | Freq: Every day | ORAL | 0 refills | Status: AC

## 2023-11-21 MED ORDER — METHYLPREDNISOLONE SODIUM SUCC 125 MG IJ SOLR: 80.0000 mg | Freq: Once | INTRAMUSCULAR | Status: DC

## 2023-11-21 NOTE — Patient Instructions (Signed)
 I am giving you a steroid shot today and then upping your steroids to 40 mg for the next 3 days starting tomorrow.   I am prescribing a 7-day course of a new antibiotic called doxycycline.  While you are taking this new antibiotic you should stop taking the azithromycin.  Once you have completed the new course of doxycycline you can restart the azithromycin.    Please come back in 2 weeks for follow-up

## 2023-11-21 NOTE — Progress Notes (Signed)
    SUBJECTIVE:   CHIEF COMPLAINT / HPI:   Andrea Berry is a 58 y.o. female  presenting for a week of fatigue, sob, fever, and coughing. Pt mentions that she started coughing last Friday and has felt feverish ever since. She mentions that she has dyspnea on exertion and she has not been able to complete her daily activities.  She has been having to use her albuterol more frequently.  Pt mentions that she has PMH significant for COPD exacerbation and CAP. She has been on predisone and z-pak for past couple months for COPD per her pulmonologist. She denies any chest pain, edema, sick contacts, hemoptysis, nausea, vomiting, diarrhea, or blood in stool. No other concerns.  History of PE but patient denies current symptoms, no unilateral swelling.  PERTINENT  PMH / PSH: Reviewed and updated   OBJECTIVE:   BP 128/80   Pulse 87   Wt 177 lb 6.4 oz (80.5 kg)   SpO2 100%   BMI 33.52 kg/m   Healthy-appearing, no acute distress, blind Cardio: Regular rate, regular rhythm, no murmurs on exam. Pulm: Clear, mild expiratory wheezing bilateral lung bases, no crackles. No increased work of breathing Abdominal: bowel sounds present, soft, non-tender, non-distended Extremities: no peripheral edema      10/02/2023   10:50 AM 09/05/2023    4:02 PM 08/07/2023    9:12 AM  PHQ9 SCORE ONLY  PHQ-9 Total Score 4 7 6       ASSESSMENT/PLAN:   COPD exacerbation: Pt's presentation with SOB on exertion, cough, fever, and generalized malaise indicates an acute exacerbation of COPD.  She is clinically well-appearing and stable.  Vital signs within normal range with oxygen saturation 100% on room air.  Other etiologies could include viral or bacterial infections including CAP due to presence of fever and malaise. PE unlike due to absence of unilateral edema or persistent SOB or tachycardia.  - Increasing prednisone to 40 mg for the next 3 days starting tomorrow - Depo-Medrol 80 mg administered in the  clinic - Doxycyline 100 mg twice daily for the next 7 day, patient instructed to hold azithromycin while she is taking Doxy - CXR deferred due to patient's wishes and empirical treatment.  - Patient to follow-up in 2 weeks or sooner if clinically worsening   Dacota Ruben Leodis Binet, Medical Student  I have reviewed and made appropriate changes to the note as above.  I agree with documentation.  Glendale Chard, DO [facility], PGY-2 11/21/23 4:15 PM   [facility]

## 2023-11-21 NOTE — Progress Notes (Signed)
 Daily Session Note  Patient Details  Name: Andrea Berry MRN: [id number] Date of Birth: [birth year] Referring Provider:   Doristine Devoid Pulmonary Rehab Walk Test from 10/02/2023 in [facility]  Referring Provider Namen  [[name]]       Encounter Date: 11/21/2023  Check In:  Session Check In - 11/21/23 1118       Check-In   Supervising physician immediately available to respond to emergencies [facility] MD immediately available    Physician(s) Edd Fabian, NP    [facility]    Staff Present Raford Pitcher, MS, ACSM-CEP, Exercise Physiologist;Casey Erin Sons BS, ACSM-CEP, Exercise Physiologist;Johnny Hale Bogus, MS, Exercise Physiologist;Samantha Belarus, RD, LDN    Virtual Visit No    Medication changes reported     No    Fall or balance concerns reported    No    Tobacco Cessation No Change    Warm-up and Cool-down Performed as group-led instruction    Resistance Training Performed Yes    VAD Patient? No    PAD/SET Patient? No      Pain Assessment   Currently in Pain? No/denies             Capillary Blood Glucose: No results found for this or any previous visit (from the past 24 hours).    Social History   Tobacco Use  Smoking Status Never  Smokeless Tobacco Never    Goals Met:  Proper associated with RPD/PD & O2 Sat Exercise tolerated well No report of concerns or symptoms today Strength training completed today  Goals Unmet:  Not Applicable  Comments: Service time is from 1004 to 1136.    Dr. Mechele Collin is Medical Director for Pulmonary Rehab at [facility].

## 2023-11-22 NOTE — Telephone Encounter (Signed)
 Spoke with patient. She stated that she had already started take 2 at bedtime last weekend, and its  still not helping her. Aquilla Solian, CMA

## 2023-11-22 NOTE — Telephone Encounter (Signed)
 Let Andrea Berry know she may increase her Trazodone to two tablets (100 mg) at bedtime.

## 2023-11-26 ENCOUNTER — Encounter (HOSPITAL_COMMUNITY)
Admission: RE | Admit: 2023-11-26 | Discharge: 2023-11-26 | Disposition: A | Discharge status: Home / Self Care | Payer: Medicare Other | Source: Ambulatory Visit | Attending: Pulmonary Disease | Admitting: Pulmonary Disease

## 2023-11-26 VITALS — Wt 179.5 lb

## 2023-11-26 DIAGNOSIS — J849 Interstitial pulmonary disease, unspecified: Secondary | ICD-10-CM | POA: Diagnosis not present

## 2023-11-26 NOTE — Progress Notes (Signed)
 Daily Session Note  Patient Details  Name: Andrea Berry MRN: [id number] Date of Birth: 1965-09-29 Referring Provider:   Doristine Devoid Pulmonary Rehab Walk Test from 10/02/2023 in [facility]  Referring Provider Namen  [[name]]       Encounter Date: 11/26/2023  Check In:  Session Check In - 11/26/23 1022       Check-In   Supervising physician immediately available to respond to emergencies [facility] MD immediately available    Physician(s) Edd Fabian, NP    [facility]    Staff Present Raford Pitcher, MS, ACSM-CEP, Exercise Physiologist;Casey Erin Sons BS, ACSM-CEP, Exercise Physiologist;Samantha Belarus, RD, Dutch Gray, RN, BSN    Virtual Visit No    Medication changes reported     No    Tobacco Cessation No Change    Warm-up and Cool-down Performed as group-led Writer Performed Yes    VAD Patient? No    PAD/SET Patient? No      Pain Assessment   Currently in Pain? No/denies             Capillary Blood Glucose: No results found for this or any previous visit (from the past 24 hours).   Exercise Prescription Changes - 11/26/23 1100       Response to Exercise   Blood Pressure (Admit) 122/80    Blood Pressure (Exercise) 124/66    Blood Pressure (Exit) 112/66    Heart Rate (Admit) 91 bpm    Heart Rate (Exercise) 93 bpm    Heart Rate (Exit) 100 bpm    Oxygen Saturation (Admit) 99 %    Oxygen Saturation (Exercise) 99 %    Oxygen Saturation (Exit) 98 %    Rating of Perceived Exertion (Exercise) 11    Perceived Dyspnea (Exercise) 0    Duration Continue with 30 min of aerobic exercise without signs/symptoms of physical distress.    Intensity THRR unchanged      Progression   Progression Continue to progress workloads to maintain intensity without signs/symptoms of physical distress.      Resistance Training   Training Prescription Yes     Weight red bands    Reps 10-15    Time 10 Minutes      NuStep   Level 3    Minutes 15    METs 1.5             Social History   Tobacco Use  Smoking Status Never  Smokeless Tobacco Never    Goals Met:  Exercise tolerated well Strength training completed today  Goals Unmet:  Not Applicable  Comments: c/o nausea today, did not interfere with exercise, Service time is from 1006 to 1127    Dr. Mechele Collin is Medical Director for Pulmonary Rehab at [facility].

## 2023-11-27 ENCOUNTER — Other Ambulatory Visit: Payer: Self-pay | Admitting: Family Medicine

## 2023-11-27 DIAGNOSIS — G8929 Other chronic pain: Secondary | ICD-10-CM

## 2023-11-27 DIAGNOSIS — G894 Chronic pain syndrome: Secondary | ICD-10-CM

## 2023-11-27 DIAGNOSIS — F419 Anxiety disorder, unspecified: Secondary | ICD-10-CM

## 2023-11-27 DIAGNOSIS — F418 Other specified anxiety disorders: Secondary | ICD-10-CM

## 2023-11-27 NOTE — Progress Notes (Signed)
 Pulmonary Individual Treatment Plan  Patient Details  Name: Andrea Berry MRN: [id number] Date of Birth: Oct 31, 1965 Referring Provider:   Doristine Devoid Pulmonary Rehab Walk Test from 10/02/2023 in [facility]  Referring Provider Namen  [[name]]       Initial Encounter Date:  Flowsheet Row Pulmonary Rehab Walk Test from 10/02/2023 in [facility]  Date 10/02/23       Visit Diagnosis: Interstitial lung disease (HCC)  Patient's Home Medications on Admission:   Current Outpatient Medications:    albuterol (VENTOLIN HFA) 108 (90 Base) MCG/ACT inhaler, Inhale 2 puffs into the lungs every 6 (six) hours as needed for wheezing or shortness of breath., Disp: 18 each, Rfl: 0   alendronate (FOSAMAX) 70 MG tablet, Take 1 tablet (70 mg total) by mouth every Friday., Disp: 12 tablet, Rfl: 3   ALTAMIST SPRAY 0.65 % nasal spray, Place 2 sprays into the nose 2 (two) times daily., Disp: , Rfl:    amitriptyline (ELAVIL) 10 MG tablet, Take 1 tablet (10 mg total) by mouth daily as needed (Headache)., Disp: , Rfl:    atorvastatin (LIPITOR) 40 MG tablet, Take 1 tablet (40 mg total) by mouth daily., Disp: 90 tablet, Rfl: 0   azithromycin (ZITHROMAX) 250 MG tablet, Take 1 tablet by mouth daily., Disp: , Rfl:    Cholecalciferol (VITAMIN D3) 50 MCG (2000 UT) TABS, Take 2 tablets by mouth daily., Disp: , Rfl:    diltiazem (CARDIZEM CD) 180 MG 24 hr capsule, Take 1 capsule (180 mg total) by mouth daily., Disp: 90 capsule, Rfl: 0   doxycycline (VIBRA-TABS) 100 MG tablet, Take 1 tablet (100 mg total) by mouth 2 (two) times daily for 7 days., Disp: 14 tablet, Rfl: 0   DULoxetine (CYMBALTA) 60 MG capsule, Take 1 capsule (60 mg total) by mouth daily., Disp: 90 capsule, Rfl: 0   esomeprazole (NEXIUM) 20 MG capsule, Take 1 capsule (20 mg total) by mouth daily at 12 noon., Disp: 90 capsule, Rfl: 0    gabapentin (NEURONTIN) 300 MG capsule, Take 2 capsules (600 mg total) by mouth 2 (two) times daily., Disp: , Rfl:    linaclotide (LINZESS) 145 MCG CAPS capsule, Take 1 capsule (145 mcg total) by mouth daily before breakfast., Disp: 90 capsule, Rfl: 0   LORazepam (ATIVAN) 0.5 MG tablet, Take 1 tablet (0.5 mg total) by mouth 2 (two) times daily., Disp: 40 tablet, Rfl: 0   ondansetron (ZOFRAN) 4 MG tablet, Take 1 tablet (4 mg total) by mouth every 8 (eight) hours as needed for nausea or vomiting., Disp: 30 tablet, Rfl: 3   OXcarbazepine (TRILEPTAL) 150 MG tablet, Take 300 mg by mouth 2 (two) times daily., Disp: , Rfl:    Oxcarbazepine (TRILEPTAL) 300 MG tablet, Take 300 mg by mouth daily., Disp: , Rfl:    Pirfenidone 267 MG TABS, Take by mouth., Disp: , Rfl:    predniSONE (DELTASONE) 10 MG tablet, Take 0.5 tablets (5 mg total) by mouth daily., Disp: 45 tablet, Rfl: 0   RiTUXimab (RITUXAN IV), Inject 1,000 mg into the vein See admin instructions. 1,000 mg into the vein every 6 months, then two weeks later another 1,000 mg injection, Disp: , Rfl:    rivaroxaban (XARELTO) 10 MG TABS tablet, Take 1 tablet (10 mg total) by mouth daily., Disp: 90 tablet, Rfl: 3   tiotropium (SPIRIVA HANDIHALER) 18 MCG inhalation capsule, Place 1 capsule (  18 mcg total) into inhaler and inhale daily., Disp: 30 capsule, Rfl: 0   traMADol (ULTRAM) 50 MG tablet, Take 1 tablet (50 mg total) by mouth every 6 (six) hours as needed., Disp: 28 tablet, Rfl: 0   traZODone (DESYREL) 50 MG tablet, Take 0.5-1 tablets (25-50 mg total) by mouth at bedtime as needed for sleep., Disp: 30 tablet, Rfl: 3  Past Medical History: Past Medical History:  Diagnosis Date   Acute left hemiparesis on chronic left hemiparesis 04/12/2022   Acute respiratory distress syndrome (ARDS) due to COVID-19 virus (HCC) 03/27/2019   Acute respiratory failure with hypoxia (HCC) 02/16/2020   Anemia of chronic disease 10/28/2015   Anxiety 07/12/2018   Arthritis     Ataxia 01/03/2016   Blind    Blindness of both eyes 04/12/2022   CAP (community acquired pneumonia) 04/07/2023   Chronic central neuropathic pain 10/02/2018   [facility]x [facility] 09/2017   Chronic coughing 09/10/2016   Chronic pain syndrome 07/12/2018   Chronic respiratory failure with hypoxia (HCC)    Chronic rhinitis 01/07/2019   COVID-19 02/04/2020   Decreased strength 10/08/2018   Proximal leg muscles   Depression    Depression with anxiety 01/03/2016   Devic's disease (HCC)    Dysesthesia 01/03/2016   Dyspnea on exertion 11/07/2018   Elevated BP without diagnosis of hypertension 08/28/2019   Report of [facility] nurse in December with normal SBPs but DBPs in low 90s    Eustachian tube dysfunction 11/18/2018   L>R. Followed by [facility].   Eustachian tube dysfunction, bilateral 09/18/2018   Eustachian tube dysfunction, left 11/13/2018   L>R. Followed by [facility].   Fall 05/10/2023   GERD (gastroesophageal reflux disease)    Greater trochanteric bursitis of left hip 04/27/2020   Formatting of this note might be different from the original. Added automatically from request for surgery [id number]  Formatting of this note might be different from the original. Added automatically from request for surgery [id number]   Headache(784.0)    History of chicken pox 10/08/2018   History of COVID-19 02/16/2020   History of CVA (cerebrovascular accident)    History of kidney stones    History of pulmonary embolism 12/10/2018   Lupus Antiphosholipid (+). Patient on secondary prophylaxis with rivaroxaban 20 mg daily long-term.     Hypertension    Hypertrophic cardiomyopathy (HCC) 03/02/2020   Hyponatremia 11/25/2015   Impairment of balance 10/08/2018   Kidney stone 09/14/2022   Legal blindness 01/30/2014   Liver masses 04/13/2022   MRI Abdomen 11/06/22 [facility]: Focus of arterial phase hyperenhancement in the right hepatic lobe  is consistent with a vascular shunt. No evidence of hepatic  mass. Cholelithiasis. No radiographic evidence of cholecystitis or biliary  ductal dilatation.      PET Scan 04/10/22 by Pulmonology for work-up of Solitary Pulmonary nodule found "Multiple hypermetabolic hepatic mass lesions. Hepatic metastasis    Long-term corticosteroid use 10/08/2018   Lung nodule 05/24/2022   07/05/22 [facility] PET Scan: 1.  Interval decrease in size of a linear left lower lobe pulmonary nodule without radiotracer uptake, favored scarring in the setting of prior infection. No new pulmonary nodule.  2.  Waxing and waning nonspecific scattered hypermetabolic nodularity throughout both lungs with decreased metabolic activity of bilateral hilar lymph nodes and stable to slightly increased hepat   Lupus    Lupus anticoagulant disorder (HCC) 10/02/2018   Pulmonary Embolism 2017   Lupus cerebritis (HCC) 11/25/2015   Possible explanation of 11/25/2015  acute hyperintensity findings on Brain MRI.    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   Orthostatic dizziness, Recurrent 10/08/2018   Associated with medications   Otalgia 07/12/2018   Personal history of immunosupression therapy 10/08/2018   Chronic Azathioprine and prednisone therapy for Devic's disease   Pigmented skin lesion of uncertain nature 11/06/2018   Pneumonia 02/21/2020   Pneumonia of right lower lobe due to infectious organism 02/21/2020   Postmenopause 10/21/2020   Pulmonary embolism (HCC) 2017   Shortness of breath    due to medication   Skin lesion of chest wall 11/07/2018   SLE (systemic lupus erythematosus related syndrome) (HCC) 11/07/2018   Mazi Schuff antibody positive 04/07/2019   Stroke (HCC) 2008   visually impaired   Stroke (HCC) 04/13/2022   Syncope    Transaminasemia 10/21/2020   Ureteral calculi 2018   Vaginal atrophy 09/14/2022   Vaginal odor 06/29/2022   Vitamin D deficiency 10/06/2018    Tobacco Use: Social History   Tobacco Use  Smoking Status Never   Smokeless Tobacco Never    Labs: Review Flowsheet  More data exists      Latest Ref Rng & Units 05/06/2020 10/17/2021 04/12/2022 01/03/2023 08/07/2023  Labs for ITP Cardiac and Pulmonary Rehab  Cholestrol 0 - 200 - 236  - - -  LDL (calc) - - 124  - - -  Direct LDL 0 - 99 mg/dL - - 67  - -  HDL-C 35 - 70 - 68  - - -  Trlycerides 40 - 160 - 289  - - -  Hemoglobin A1c 4.0 - 5.6 % 5.1%  - 6.3  6.1  6.7   TCO2 22 - 32 mmol/L - - 23  - -    Capillary Blood Glucose: Lab Results  Component Value Date   GLUCAP 100 (H) 04/12/2022   GLUCAP 68 (L) 04/12/2022   GLUCAP 272 (H) 04/12/2022   GLUCAP 58 (L) 04/12/2022   GLUCAP 130 (H) 03/05/2021     Pulmonary Assessment Scores:  Pulmonary Assessment Scores     Row Name 10/02/23 0958         ADL UCSD   ADL Phase Entry     SOB Score total 68       CAT Score   CAT Score 24       mMRC Score   mMRC Score 4             UCSD: Self-administered rating of dyspnea associated with activities of daily living (ADLs) 6-point scale (0 = "not at all" to 5 = "maximal or unable to do because of breathlessness")  Scoring Scores range from 0 to 120.  Minimally important difference is 5 units  CAT: CAT can identify the health impairment of COPD patients and is better correlated with disease progression.  CAT has a scoring range of zero to 40. The CAT score is classified into four groups of low (less than 10), medium (10 - 20), high (21-30) and very high (31-40) based on the impact level of disease on health status. A CAT score over 10 suggests significant symptoms.  A worsening CAT score could be explained by an exacerbation, poor medication adherence, poor inhaler technique, or progression of COPD or comorbid conditions.  CAT MCID is 2 points  mMRC: mMRC (Modified Medical Research Council) Dyspnea Scale is used to assess the degree of baseline functional disability in patients of respiratory disease due to dyspnea. No minimal important  difference  is established. A decrease in score of 1 point or greater is considered a positive change.   Pulmonary Function Assessment:  Pulmonary Function Assessment - 10/02/23 0957       Breath   Shortness of Breath Yes;Panic with Shortness of Breath;Fear of Shortness of Breath;Limiting activity             Exercise Target Goals: Exercise Program Goal: Individual exercise prescription set using results from initial 6 min walk test and THRR while considering  patient's activity barriers and safety.   Exercise Prescription Goal: Initial exercise prescription builds to 30-45 minutes a day of aerobic activity, 2-3 days per week.  Home exercise guidelines will be given to patient during program as part of exercise prescription that the participant will acknowledge.  Activity Barriers & Risk Stratification:  Activity Barriers & Cardiac Risk Stratification - 10/02/23 0959       Activity Barriers & Cardiac Risk Stratification   Activity Barriers Arthritis;Back Problems;Neck/Spine Problems;History of Falls;Balance Concerns;Shortness of Breath;Muscular Weakness;Deconditioning;Joint Problems             6 Minute Walk:  6 Minute Walk     Row Name 10/02/23 1102         6 Minute Walk   Phase Initial     Distance 485 feet     Walk Time 6 minutes     # of Rest Breaks 1  5:30-5:50     MPH 0.92     METS 2.05     RPE 12     Perceived Dyspnea  0     VO2 Peak 7.16     Symptoms No     Resting HR 107 bpm     Resting BP 122/74     Resting Oxygen Saturation  100 %     Exercise Oxygen Saturation  during 6 min walk 94 %     Max Ex. HR 122 bpm     Max Ex. BP 118/74     2 Minute Post BP 116/70       Interval HR   1 Minute HR 115     2 Minute HR 111     3 Minute HR 109     4 Minute HR 111     5 Minute HR 122     6 Minute HR 114     2 Minute Post HR 99     Interval Heart Rate? Yes       Interval Oxygen   Interval Oxygen? Yes     Baseline Oxygen Saturation % 100 %     1  Minute Oxygen Saturation % 100 %     1 Minute Liters of Oxygen 0 L     2 Minute Oxygen Saturation % 98 %     2 Minute Liters of Oxygen 0 L     3 Minute Oxygen Saturation % 99 %     3 Minute Liters of Oxygen 0 L     4 Minute Oxygen Saturation % 99 %     4 Minute Liters of Oxygen 0 L     5 Minute Oxygen Saturation % 94 %     5 Minute Liters of Oxygen 0 L     6 Minute Oxygen Saturation % 98 %     6 Minute Liters of Oxygen 0 L     2 Minute Post Oxygen Saturation % 100 %     2 Minute Post Liters of Oxygen 0 L  Oxygen Initial Assessment:  Oxygen Initial Assessment - 10/02/23 0937       Home Oxygen   Home Oxygen Device None    Sleep Oxygen Prescription None    Home Exercise Oxygen Prescription None    Home Resting Oxygen Prescription None      Initial 6 min Walk   Oxygen Used None      Program Oxygen Prescription   Program Oxygen Prescription None      Intervention   Short Term Goals To learn and exhibit compliance with exercise, home and travel O2 prescription;To learn and understand importance of maintaining oxygen saturations>88%;To learn and demonstrate proper use of respiratory medications;To learn and understand importance of monitoring SPO2 with pulse oximeter and demonstrate accurate use of the pulse oximeter.;To learn and demonstrate proper pursed lip breathing techniques or other breathing techniques.     Long  Term Goals Exhibits compliance with exercise, home  and travel O2 prescription;Verbalizes importance of monitoring SPO2 with pulse oximeter and return demonstration;Maintenance of O2 saturations>88%;Exhibits proper breathing techniques, such as pursed lip breathing or other method taught during program session;Compliance with respiratory medication;Demonstrates proper use of MDI's             Oxygen Re-Evaluation:  Oxygen Re-Evaluation     Row Name 10/02/23 2956 10/21/23 1216 11/20/23 1206         Program Oxygen Prescription   Program  Oxygen Prescription -- None None       Home Oxygen   Home Oxygen Device -- None None     Sleep Oxygen Prescription -- None None     Home Exercise Oxygen Prescription -- None None     Home Resting Oxygen Prescription -- None None       Goals/Expected Outcomes   Short Term Goals -- To learn and exhibit compliance with exercise, home and travel O2 prescription;To learn and understand importance of maintaining oxygen saturations>88%;To learn and demonstrate proper use of respiratory medications;To learn and understand importance of monitoring SPO2 with pulse oximeter and demonstrate accurate use of the pulse oximeter.;To learn and demonstrate proper pursed lip breathing techniques or other breathing techniques.  To learn and exhibit compliance with exercise, home and travel O2 prescription;To learn and understand importance of maintaining oxygen saturations>88%;To learn and demonstrate proper use of respiratory medications;To learn and understand importance of monitoring SPO2 with pulse oximeter and demonstrate accurate use of the pulse oximeter.;To learn and demonstrate proper pursed lip breathing techniques or other breathing techniques.      Long  Term Goals -- Exhibits compliance with exercise, home  and travel O2 prescription;Verbalizes importance of monitoring SPO2 with pulse oximeter and return demonstration;Maintenance of O2 saturations>88%;Exhibits proper breathing techniques, such as pursed lip breathing or other method taught during program session;Compliance with respiratory medication;Demonstrates proper use of MDI's Exhibits compliance with exercise, home  and travel O2 prescription;Verbalizes importance of monitoring SPO2 with pulse oximeter and return demonstration;Maintenance of O2 saturations>88%;Exhibits proper breathing techniques, such as pursed lip breathing or other method taught during program session;Compliance with respiratory medication;Demonstrates proper use of MDI's      Goals/Expected Outcomes Compliance and understanding of oxygen saturation monitoring and breathing techniques to decrease shortness of breath. Compliance and understanding of oxygen saturation monitoring and breathing techniques to decrease shortness of breath. Compliance and understanding of oxygen saturation monitoring and breathing techniques to decrease shortness of breath.              Oxygen Discharge (Final Oxygen Re-Evaluation):  Oxygen Re-Evaluation - 11/20/23  1206       Program Oxygen Prescription   Program Oxygen Prescription None      Home Oxygen   Home Oxygen Device None    Sleep Oxygen Prescription None    Home Exercise Oxygen Prescription None    Home Resting Oxygen Prescription None      Goals/Expected Outcomes   Short Term Goals To learn and exhibit compliance with exercise, home and travel O2 prescription;To learn and understand importance of maintaining oxygen saturations>88%;To learn and demonstrate proper use of respiratory medications;To learn and understand importance of monitoring SPO2 with pulse oximeter and demonstrate accurate use of the pulse oximeter.;To learn and demonstrate proper pursed lip breathing techniques or other breathing techniques.     Long  Term Goals Exhibits compliance with exercise, home  and travel O2 prescription;Verbalizes importance of monitoring SPO2 with pulse oximeter and return demonstration;Maintenance of O2 saturations>88%;Exhibits proper breathing techniques, such as pursed lip breathing or other method taught during program session;Compliance with respiratory medication;Demonstrates proper use of MDI's    Goals/Expected Outcomes Compliance and understanding of oxygen saturation monitoring and breathing techniques to decrease shortness of breath.             Initial Exercise Prescription:  Initial Exercise Prescription - 10/02/23 1100       Date of Initial Exercise RX and Referring Provider   Date 10/02/23    Referring  Provider Marlowe Aschoff   Expected Discharge Date 12/26/23      NuStep   Level 1    SPM 50    Minutes 20    METs 1.5      Prescription Details   Frequency (times per week) 2    Duration Progress to 30 minutes of continuous aerobic without signs/symptoms of physical distress      Intensity   THRR 40-80% of Max Heartrate 66-132    Ratings of Perceived Exertion 11-13    Perceived Dyspnea 0-4      Progression   Progression Continue to progress workloads to maintain intensity without signs/symptoms of physical distress.      Resistance Training   Training Prescription Yes    Weight red bands    Reps 10-15             Perform Capillary Blood Glucose checks as needed.  Exercise Prescription Changes:   Exercise Prescription Changes     Row Name 10/15/23 1200 10/29/23 1200 11/12/23 1100 11/26/23 1100       Response to Exercise   Blood Pressure (Admit) 120/70 108/72 124/70 122/80    Blood Pressure (Exercise) 128/80 138/70 136/88 124/66    Blood Pressure (Exit) 124/70 128/72 130/68 112/66    Heart Rate (Admit) 85 bpm 94 bpm 93 bpm 91 bpm    Heart Rate (Exercise) 86 bpm 87 bpm 106 bpm 93 bpm    Heart Rate (Exit) 93 bpm 86 bpm 103 bpm 100 bpm    Oxygen Saturation (Admit) 100 % 98 % 98 % 99 %    Oxygen Saturation (Exercise) 98 % 96 % 96 % 99 %    Oxygen Saturation (Exit) 99 % 98 % 97 % 98 %    Rating of Perceived Exertion (Exercise) 11 12 11 11     Perceived Dyspnea (Exercise) 0 0 0 0    Duration Continue with 30 min of aerobic exercise without signs/symptoms of physical distress. Continue with 30 min of aerobic exercise without signs/symptoms of physical distress. Continue with 30 min of aerobic exercise without  signs/symptoms of physical distress. Continue with 30 min of aerobic exercise without signs/symptoms of physical distress.    Intensity THRR unchanged THRR unchanged THRR unchanged THRR unchanged      Progression   Progression Continue to progress workloads to  maintain intensity without signs/symptoms of physical distress. Continue to progress workloads to maintain intensity without signs/symptoms of physical distress. Continue to progress workloads to maintain intensity without signs/symptoms of physical distress. Continue to progress workloads to maintain intensity without signs/symptoms of physical distress.      Resistance Training   Training Prescription Yes Yes Yes Yes    Weight red bands red bands red bands red bands    Reps 10-15 10-15 10-15 10-15    Time 10 Minutes 10 Minutes 10 Minutes 10 Minutes      NuStep   Level 1 3 3 3     SPM 76 -- -- --    Minutes 30 15 15 15     METs 1.6 2 2  1.5      Track   Laps -- 14 15 --    Minutes -- 15 15 --    METs -- 3.15 2 --             Exercise Comments:   Exercise Comments     Row Name 10/08/23 1507           Exercise Comments Pt completed first day of exercise. Michelle exercised for 25 min on the Nustep. She averaged 1.1 METs at level 1 on the Nustep. She was unable to perform the warmup due to arriving late but did the cooldown. Marcelino Duster needs 1 on 1 assistance due to her vision impairment. Discussed progression with her and how I will eventually progress her. Marcelino Duster voiced understanding.                Exercise Goals and Review:   Exercise Goals     Row Name 10/02/23 0936             Exercise Goals   Increase Physical Activity Yes       Intervention Provide advice, education, support and counseling about physical activity/exercise needs.;Develop an individualized exercise prescription for aerobic and resistive training based on initial evaluation findings, risk stratification, comorbidities and participant's personal goals.       Expected Outcomes Short Term: Attend rehab on a regular basis to increase amount of physical activity.;Long Term: Exercising regularly at least 3-5 days a week.;Long Term: Add in home exercise to make exercise part of routine and to increase  amount of physical activity.       Increase Strength and Stamina Yes       Intervention Provide advice, education, support and counseling about physical activity/exercise needs.;Develop an individualized exercise prescription for aerobic and resistive training based on initial evaluation findings, risk stratification, comorbidities and participant's personal goals.       Expected Outcomes Short Term: Increase workloads from initial exercise prescription for resistance, speed, and METs.;Short Term: Perform resistance training exercises routinely during rehab and add in resistance training at home;Long Term: Improve cardiorespiratory fitness, muscular endurance and strength as measured by increased METs and functional capacity ( )       Able to understand and use rate of perceived exertion (RPE) scale Yes       Intervention Provide education and explanation on how to use RPE scale       Expected Outcomes Short Term: Able to use RPE daily in rehab to express subjective intensity level;Long Term:  Able to use RPE to guide intensity level when exercising independently       Able to understand and use Dyspnea scale Yes       Intervention Provide education and explanation on how to use Dyspnea scale       Expected Outcomes Short Term: Able to use Dyspnea scale daily in rehab to express subjective sense of shortness of breath during exertion;Long Term: Able to use Dyspnea scale to guide intensity level when exercising independently       Knowledge and understanding of Target Heart Rate Range (THRR) Yes       Intervention Provide education and explanation of THRR including how the numbers were predicted and where they are located for reference       Expected Outcomes Short Term: Able to state/look up THRR;Long Term: Able to use THRR to govern intensity when exercising independently;Short Term: Able to use daily as guideline for intensity in rehab       Understanding of Exercise Prescription Yes        Intervention Provide education, explanation, and written materials on patient's individual exercise prescription       Expected Outcomes Short Term: Able to explain program exercise prescription;Long Term: Able to explain home exercise prescription to exercise independently                Exercise Goals Re-Evaluation :  Exercise Goals Re-Evaluation     Row Name 10/02/23 6295 10/21/23 1211 11/20/23 1200         Exercise Goal Re-Evaluation   Exercise Goals Review Increase Physical Activity;Able to understand and use Dyspnea scale;Understanding of Exercise Prescription;Increase Strength and Stamina;Knowledge and understanding of Target Heart Rate Range (THRR);Able to understand and use rate of perceived exertion (RPE) scale Increase Physical Activity;Able to understand and use Dyspnea scale;Understanding of Exercise Prescription;Increase Strength and Stamina;Knowledge and understanding of Target Heart Rate Range (THRR);Able to understand and use rate of perceived exertion (RPE) scale Increase Physical Activity;Able to understand and use Dyspnea scale;Understanding of Exercise Prescription;Increase Strength and Stamina;Knowledge and understanding of Target Heart Rate Range (THRR);Able to understand and use rate of perceived exertion (RPE) scale     Comments Ronnesha is scheduled to begin exercise on 1/28. Will monitor and progress as able. Marcelino Duster has completed 4 exercise sessions. She exercises for 30 min on the Nustep. Michelle averages 1.9 METs at level 2 on the Nustep. It is too soon to notate any discernable progressions. Marcelino Duster will be difficult to progress due to her visual impairment, but she seems determined to improve her functional capacity. Will continue to monitor and progress as able. Marcelino Duster has completed 11 exercise sessions. She exercises for 15 min on the Nustep and track. Michelle averages 2.2 METs at level 3 on the Nustep and 2.04 METs on the track. She performs the warmup and  cooldown standing with chair in front of her for balance. Marcelino Duster has increased her level on the Nustep a few times. Her METs have increased. She has also transitioned to walking the back track. Marcelino Duster is able to walk the back track without assistance even though she is visually impaired. Will continue to monitor and progress as able.     Expected Outcomes Through exercise and rehab and home, the patient will decrease shortness of breath with daily activities and feel confident in carrying out an exercise regimen at home. Through exercise and rehab and home, the patient will decrease shortness of breath with daily activities and feel confident in carrying out an  exercise regimen at home. Through exercise and rehab and home, the patient will decrease shortness of breath with daily activities and feel confident in carrying out an exercise regimen at home.              Discharge Exercise Prescription (Final Exercise Prescription Changes):  Exercise Prescription Changes - 11/26/23 1100       Response to Exercise   Blood Pressure (Admit) 122/80    Blood Pressure (Exercise) 124/66    Blood Pressure (Exit) 112/66    Heart Rate (Admit) 91 bpm    Heart Rate (Exercise) 93 bpm    Heart Rate (Exit) 100 bpm    Oxygen Saturation (Admit) 99 %    Oxygen Saturation (Exercise) 99 %    Oxygen Saturation (Exit) 98 %    Rating of Perceived Exertion (Exercise) 11    Perceived Dyspnea (Exercise) 0    Duration Continue with 30 min of aerobic exercise without signs/symptoms of physical distress.    Intensity THRR unchanged      Progression   Progression Continue to progress workloads to maintain intensity without signs/symptoms of physical distress.      Resistance Training   Training Prescription Yes    Weight red bands    Reps 10-15    Time 10 Minutes      NuStep   Level 3    Minutes 15    METs 1.5             Nutrition:  Target Goals: Understanding of nutrition guidelines, daily intake  of sodium 1500mg , cholesterol 200mg , calories 30% from fat and 7% or less from saturated fats, daily to have 5 or more servings of fruits and vegetables.  Biometrics:    Nutrition Therapy Plan and Nutrition Goals:  Nutrition Therapy & Goals - 11/21/23 0907       Nutrition Therapy   Diet Heart healthy/carbohydrate consistent diet    Drug/Food Interactions Statins/Certain Fruits      Personal Nutrition Goals   Nutrition Goal Patient to improve diet quality by using the plate method as a guide for meal planning to include lean protein/plant protein, fruits, vegetables, whole grains, nonfat dairy as part of a well-balanced diet.    Comments Marcelino Duster has medical history of ILD, HTN, OSA, SLE, legal blindness, hx of TIA. She is aking pirfenidone for ILD; she has some nausea but no subsequent weight loss. She is up 3.7# since starting pulmonary rehab. Per most recent labs, A1c from November 2024 was in a diabetic range; she is not taking medication for this. Patient will continue to benefit from participation in pulmonary rehab for nutrition, exercise, and lifestyle modification.      Intervention Plan   Intervention Prescribe, educate and counsel regarding individualized specific dietary modifications aiming towards targeted core components such as weight, hypertension, lipid management, diabetes, heart failure and other comorbidities.;Nutrition handout(s) given to patient.    Expected Outcomes Short Term Goal: Understand basic principles of dietary content, such as calories, fat, sodium, cholesterol and nutrients.;Long Term Goal: Adherence to prescribed nutrition plan.             Nutrition Assessments:  MEDIFICTS Score Key: >=70 Need to make dietary changes  40-70 Heart Healthy Diet <= 40 Therapeutic Level Cholesterol Diet   Picture Your Plate Scores: <40 Unhealthy dietary pattern with much room for improvement. 41-50 Dietary pattern unlikely to meet recommendations for good  health and room for improvement. 51-60 More healthful dietary pattern, with some room for improvement.  >  60 Healthy dietary pattern, although there may be some specific behaviors that could be improved.    Nutrition Goals Re-Evaluation:  Nutrition Goals Re-Evaluation     Row Name 11/21/23 0907             Goals   Current Weight 174 lb 9.7 oz (79.2 kg)       Comment A1c 6.7       Expected Outcome Marcelino Duster has medical history of ILD, HTN, OSA, SLE, legal blindness, hx of TIA. She is aking pirfenidone for ILD; she has some nausea but no subsequent weight loss. She is up 3.7# since starting pulmonary rehab. Per most recent labs, A1c from November 2024 was in a diabetic range; she is not taking medication for this. Patient will continue to benefit from participation in pulmonary rehab for nutrition, exercise, and lifestyle modification.                Nutrition Goals Discharge (Final Nutrition Goals Re-Evaluation):  Nutrition Goals Re-Evaluation - 11/21/23 0907       Goals   Current Weight 174 lb 9.7 oz (79.2 kg)    Comment A1c 6.7    Expected Outcome Marcelino Duster has medical history of ILD, HTN, OSA, SLE, legal blindness, hx of TIA. She is aking pirfenidone for ILD; she has some nausea but no subsequent weight loss. She is up 3.7# since starting pulmonary rehab. Per most recent labs, A1c from November 2024 was in a diabetic range; she is not taking medication for this. Patient will continue to benefit from participation in pulmonary rehab for nutrition, exercise, and lifestyle modification.             Psychosocial: Target Goals: Acknowledge presence or absence of significant depression and/or stress, maximize coping skills, provide positive support system. Participant is able to verbalize types and ability to use techniques and skills needed for reducing stress and depression.  Initial Review & Psychosocial Screening:  Initial Psych Review & Screening - 10/02/23 0939        Initial Review   Current issues with Current Anxiety/Panic;Current Depression    Comments Pt is on medication for anxiety and depression.      Family Dynamics   Good Support System? Yes    Comments children      Barriers   Psychosocial barriers to participate in program There are no identifiable barriers or psychosocial needs.      Screening Interventions   Interventions Encouraged to exercise             Quality of Life Scores:  Scores of 19 and below usually indicate a poorer quality of life in these areas.  A difference of  2-3 points is a clinically meaningful difference.  A difference of 2-3 points in the total score of the Quality of Life Index has been associated with significant improvement in overall quality of life, self-image, physical symptoms, and general health in studies assessing change in quality of life.  PHQ-9: Review Flowsheet  More data exists      10/02/2023 09/05/2023 08/07/2023 07/10/2023 05/30/2023  Depression screen PHQ 2/9  Decreased Interest 1 1 1 1  0  Down, Depressed, Hopeless 0 0 1 1 0  PHQ - 2 Score 1 1 2 2  0  Altered sleeping 1 1 1 1  0  Tired, decreased energy 1 1 1 1  0  Change in appetite 1 1 1 1  0  Feeling bad or failure about yourself  0 0 0 0 0  Trouble concentrating  0 3 1 1  0  Moving slowly or fidgety/restless 0 0 0 0 0  Suicidal thoughts 0 0 0 0 0  PHQ-9 Score 4 7 6 6  0  Difficult doing work/chores Not difficult at all Somewhat difficult Somewhat difficult Somewhat difficult -   Interpretation of Total Score  Total Score Depression Severity:  1-4 = Minimal depression, 5-9 = Mild depression, 10-14 = Moderate depression, 15-19 = Moderately severe depression, 20-27 = Severe depression   Psychosocial Evaluation and Intervention:  Psychosocial Evaluation - 10/02/23 0940       Psychosocial Evaluation & Interventions   Interventions Stress management education;Relaxation education;Encouraged to exercise with the program and follow  exercise prescription    Comments Kerrianne denies any psychosocial barriers at this time.    Expected Outcomes For Charlesetta to participate in rehab free of psychosocial barriers.    Continue Psychosocial Services  Follow up required by staff             Psychosocial Re-Evaluation:  Psychosocial Re-Evaluation     Row Name 10/25/23 0929 11/20/23 1149           Psychosocial Re-Evaluation   Current issues with Current Depression;Current Anxiety/Panic Current Depression;Current Anxiety/Panic      Comments Marcelino Duster feels her depression is stable at this time. She states coming to class has helped her anxiety and depression. She is compliant with taking her psychotropic meds. No new barriers or concerns at this time. Marcelino Duster denies any new psychosocial barriers or concerns at this time. She is compliant with taking her psychotropic meds.      Expected Outcomes For Marcelino Duster to participate in PR with less depression and anxiety For Marcelino Duster to participate in PR with less depression and anxiety      Interventions Encouraged to attend Pulmonary Rehabilitation for the exercise Encouraged to attend Pulmonary Rehabilitation for the exercise      Continue Psychosocial Services  Follow up required by staff Follow up required by staff               Psychosocial Discharge (Final Psychosocial Re-Evaluation):  Psychosocial Re-Evaluation - 11/20/23 1149       Psychosocial Re-Evaluation   Current issues with Current Depression;Current Anxiety/Panic    Comments Marcelino Duster denies any new psychosocial barriers or concerns at this time. She is compliant with taking her psychotropic meds.    Expected Outcomes For Marcelino Duster to participate in PR with less depression and anxiety    Interventions Encouraged to attend Pulmonary Rehabilitation for the exercise    Continue Psychosocial Services  Follow up required by staff             Education: Education Goals: Education classes will be provided on a weekly  basis, covering required topics. Participant will state understanding/return demonstration of topics presented.  Learning Barriers/Preferences:  Learning Barriers/Preferences - 10/02/23 0956       Learning Barriers/Preferences   Learning Barriers Sight    Learning Preferences Skilled Demonstration;Individual Instruction;Audio             Education Topics: Know Your Numbers Group instruction that is supported by a PowerPoint presentation. Instructor discusses importance of knowing and understanding resting, exercise, and post-exercise oxygen saturation, heart rate, and blood pressure. Oxygen saturation, heart rate, blood pressure, rating of perceived exertion, and dyspnea are reviewed along with a normal range for these values.    Exercise for the Pulmonary Patient Group instruction that is supported by a PowerPoint presentation. Instructor discusses benefits of exercise, core components of exercise,  frequency, duration, and intensity of an exercise routine, importance of utilizing pulse oximetry during exercise, safety while exercising, and options of places to exercise outside of rehab.    MET Level  Group instruction provided by PowerPoint, verbal discussion, and written material to support subject matter. Instructor reviews what METs are and how to increase METs.  Flowsheet Row PULMONARY REHAB OTHER RESPIRATORY from 10/31/2023 in [facility], Vascular, & Lung Health  Date 10/31/23  Educator EP  Instruction Review Code 1- Verbalizes Understanding       Pulmonary Medications Verbally interactive group education provided by instructor with focus on inhaled medications and proper administration.   Anatomy and Physiology of the Respiratory System Group instruction provided by PowerPoint, verbal discussion, and written material to support subject matter. Instructor reviews respiratory cycle and anatomical components of the respiratory system and  their functions. Instructor also reviews differences in obstructive and restrictive respiratory diseases with examples of each.    Oxygen Safety Group instruction provided by PowerPoint, verbal discussion, and written material to support subject matter. There is an overview of "What is Oxygen" and "Why do we need it".  Instructor also reviews how to create a safe environment for oxygen use, the importance of using oxygen as prescribed, and the risks of noncompliance. There is a brief discussion on traveling with oxygen and resources the patient may utilize.   Oxygen Use Group instruction provided by PowerPoint, verbal discussion, and written material to discuss how supplemental oxygen is prescribed and different types of oxygen supply systems. Resources for more information are provided.    Breathing Techniques Group instruction that is supported by demonstration and informational handouts. Instructor discusses the benefits of pursed lip and diaphragmatic breathing and detailed demonstration on how to perform both.     Risk Factor Reduction Group instruction that is supported by a PowerPoint presentation. Instructor discusses the definition of a risk factor, different risk factors for pulmonary disease, and how the heart and lungs work together. Flowsheet Row PULMONARY REHAB OTHER RESPIRATORY from 10/24/2023 in [facility] for Heart, Vascular, & Lung Health  Date 10/24/23  Educator EP  Instruction Review Code 1- Verbalizes Understanding       Pulmonary Diseases Group instruction provided by PowerPoint, verbal discussion, and written material to support subject matter. Instructor gives an overview of the different type of pulmonary diseases. There is also a discussion on risk factors and symptoms as well as ways to manage the diseases.   Stress and Energy Conservation Group instruction provided by PowerPoint, verbal discussion, and written material to support  subject matter. Instructor gives an overview of stress and the impact it can have on the body. Instructor also reviews ways to reduce stress. There is also a discussion on energy conservation and ways to conserve energy throughout the day. Flowsheet Row PULMONARY REHAB OTHER RESPIRATORY from 10/10/2023 in [facility] for Heart, Vascular, & Lung Health  Date 10/10/23  Educator RN  Instruction Review Code 1- Verbalizes Understanding       Warning Signs and Symptoms Group instruction provided by PowerPoint, verbal discussion, and written material to support subject matter. Instructor reviews warning signs and symptoms of stroke, heart attack, cold and flu. Instructor also reviews ways to prevent the spread of infection. Flowsheet Row PULMONARY REHAB OTHER RESPIRATORY from 10/17/2023 in [facility] for Heart, Vascular, & Lung Health  Date 10/17/23  Educator RN  Instruction Review Code 1- Verbalizes Understanding  Other Education Group or individual verbal, written, or video instructions that support the educational goals of the pulmonary rehab program. Flowsheet Row PULMONARY REHAB OTHER RESPIRATORY from 11/21/2023 in [facility] for Heart, Vascular, & Lung Health  Date 11/21/23  Educator EP  Instruction Review Code 1- Verbalizes Understanding        Knowledge Questionnaire Score:  Knowledge Questionnaire Score - 10/02/23 1052       Knowledge Questionnaire Score   Pre Score 15/18    Post Score --             Core Components/Risk Factors/Patient Goals at Admission:  Personal Goals and Risk Factors at Admission - 10/02/23 0956       Core Components/Risk Factors/Patient Goals on Admission   Improve shortness of breath with ADL's Yes    Intervention Provide education, individualized exercise plan and daily activity instruction to help decrease symptoms of SOB with activities of daily living.    Expected  Outcomes Short Term: Improve cardiorespiratory fitness to achieve a reduction of symptoms when performing ADLs;Long Term: Be able to perform more ADLs without symptoms or delay the onset of symptoms             Core Components/Risk Factors/Patient Goals Review:   Goals and Risk Factor Review     Row Name 10/25/23 0932 11/20/23 1151           Core Components/Risk Factors/Patient Goals Review   Personal Goals Review Improve shortness of breath with ADL's;Develop more efficient breathing techniques such as purse lipped breathing and diaphragmatic breathing and practicing self-pacing with activity. Improve shortness of breath with ADL's;Develop more efficient breathing techniques such as purse lipped breathing and diaphragmatic breathing and practicing self-pacing with activity.      Review Monthly review of goals are as follows.Marland KitchenMarland KitchenGoal progressing for developing more efficient breathing techniques such as purse lipped breathing and diaphragmatic breathing; and practicing self-pacing with activity. Goal progressing for improving shortness of breath with ADL's. Marcelino Duster is currently exercising on the Nustep for 30 minutes. Her sats have remained >88% on RA while exercising. We will continue monitoring Michelle's progress throughout the program. Goal met for developing more efficient breathing techniques such as purse lipped breathing and diaphragmatic breathing; and practicing self-pacing with activity. Marcelino Duster is able to demonstrate purse lip breathing when she gets SOB. She also knows how to pace herself based on her rate of perceived exertion score. Goal progressing for improving shortness of breath with ADL's. Marcelino Duster is currently exercising on the Nustep and walking the track.  Her sats have remained >88% on RA while exercising. We will continue monitoring Michelle's progress throughout the program.      Expected Outcomes To improve shortness of breath with ADL's and develop more efficient  breathing techniques such as purse lipped breathing and diaphragmatic breathing; and practicing self-pacing with activity. To improve shortness of breath with ADL's               Core Components/Risk Factors/Patient Goals at Discharge (Final Review):   Goals and Risk Factor Review - 11/20/23 1151       Core Components/Risk Factors/Patient Goals Review   Personal Goals Review Improve shortness of breath with ADL's;Develop more efficient breathing techniques such as purse lipped breathing and diaphragmatic breathing and practicing self-pacing with activity.    Review Goal met for developing more efficient breathing techniques such as purse lipped breathing and diaphragmatic breathing; and practicing self-pacing with activity. Marcelino Duster is able to demonstrate purse lip  breathing when she gets SOB. She also knows how to pace herself based on her rate of perceived exertion score. Goal progressing for improving shortness of breath with ADL's. Marcelino Duster is currently exercising on the Nustep and walking the track.  Her sats have remained >88% on RA while exercising. We will continue monitoring Michelle's progress throughout the program.    Expected Outcomes To improve shortness of breath with ADL's             ITP Comments:Pt is making expected progress toward Pulmonary Rehab goals after completing 13 session(s). Recommend continued exercise, life style modification, education, and utilization of breathing techniques to increase stamina and strength, while also decreasing shortness of breath with exertion.  Dr. Mechele Collin is Medical Director for Pulmonary Rehab at [facility]     Comments:

## 2023-11-28 ENCOUNTER — Encounter (HOSPITAL_COMMUNITY): Admission: RE | Admit: 2023-11-28 | Payer: Medicare Other | Source: Ambulatory Visit

## 2023-11-28 ENCOUNTER — Telehealth (HOSPITAL_COMMUNITY): Payer: Self-pay

## 2023-11-28 NOTE — Telephone Encounter (Signed)
 RN called pt since she missed Pulm Rehab today. Pt stated she's having GI issues and will hopefully be back to class on Tuesday.

## 2023-12-03 ENCOUNTER — Encounter (HOSPITAL_COMMUNITY)
Admission: RE | Admit: 2023-12-03 | Discharge: 2023-12-03 | Disposition: A | Discharge status: Home / Self Care | Payer: Medicare Other | Source: Ambulatory Visit | Attending: Pulmonary Disease | Admitting: Pulmonary Disease

## 2023-12-03 DIAGNOSIS — J849 Interstitial pulmonary disease, unspecified: Secondary | ICD-10-CM

## 2023-12-03 NOTE — Progress Notes (Signed)
 Daily Session Note  Patient Details  Name: Andrea Berry MRN: [id number] Date of Birth: 1965/11/17 Referring Provider:   Doristine Devoid Pulmonary Rehab Walk Test from 10/02/2023 in [facility] for Heart, Vascular, & Lung Health  Referring Provider Namen  [[name]]       Encounter Date: 12/03/2023  Check In:  Session Check In - 12/03/23 1027       Check-In   Supervising physician immediately available to respond to emergencies [facility] MD immediately available    Physician(s) Hoover Browns, NP    Location [facility]    Staff Present Raford Pitcher, MS, ACSM-CEP, Exercise Physiologist;Casey Erin Sons BS, ACSM-CEP, Exercise Physiologist;Samantha Belarus, RD, Dutch Gray, RN, BSN    Virtual Visit No    Medication changes reported     No    Tobacco Cessation No Change    Warm-up and Cool-down Performed as group-led Writer Performed Yes    VAD Patient? No    PAD/SET Patient? No      Pain Assessment   Currently in Pain? No/denies    Multiple Pain Sites No             Capillary Blood Glucose: No results found for this or any previous visit (from the past 24 hours).   Exercise Prescription Changes - 12/03/23 1100       Home Exercise Plan   Plans to continue exercise at Home (comment)    Frequency Add 2 additional days to program exercise sessions.    Initial Home Exercises Provided 12/03/23             Social History   Tobacco Use  Smoking Status Never  Smokeless Tobacco Never    Goals Met:  Exercise tolerated well No report of concerns or symptoms today Strength training completed today  Goals Unmet:  Not Applicable  Comments: Service time is from 1010 to 1129    Dr. Mechele Collin is Medical Director for Pulmonary Rehab at [facility].

## 2023-12-03 NOTE — Progress Notes (Signed)
 Home Exercise Prescription I have reviewed a Home Exercise Prescription with Andrea Berry. Andrea Berry is not consistently exercising at home. I encouraged her to start walking 2 non-rehab days/wk for 30 min/day. Andrea Berry has impaired vision, so I recommended walking around her house or walking outside with her son. She agreed with my recommendations. Andrea Berry seems motivated to exercise. The patient stated that their goals were to maintain current health. We reviewed exercise guidelines, target heart rate during exercise, RPE Scale, weather conditions, endpoints for exercise, warmup and cool down. The patient is encouraged to come to me with any questions. I will continue to follow up with the patient to assist them with progression and safety. Spent 15 min with patient discussing home exercise plan and goals  Joya San, MS, ACSM-CEP 12/03/2023 11:50 AM

## 2023-12-05 ENCOUNTER — Encounter (HOSPITAL_COMMUNITY): Payer: Self-pay

## 2023-12-05 ENCOUNTER — Encounter: Payer: Self-pay | Admitting: Student

## 2023-12-05 ENCOUNTER — Other Ambulatory Visit: Payer: Self-pay

## 2023-12-05 ENCOUNTER — Encounter (HOSPITAL_COMMUNITY): Admission: RE | Admit: 2023-12-05 | Payer: Medicare Other | Source: Ambulatory Visit

## 2023-12-05 ENCOUNTER — Ambulatory Visit: Payer: Self-pay | Admitting: Student

## 2023-12-05 ENCOUNTER — Ambulatory Visit: Admitting: Student

## 2023-12-05 VITALS — BP 136/75 | HR 102 | Ht 61.0 in | Wt 175.0 lb

## 2023-12-05 DIAGNOSIS — J479 Bronchiectasis, uncomplicated: Secondary | ICD-10-CM | POA: Diagnosis not present

## 2023-12-05 NOTE — Progress Notes (Signed)
 Pt missed PR class due to scheduled Drs appointment.

## 2023-12-05 NOTE — Assessment & Plan Note (Signed)
 Continue current chronic medications.  Strict return precautions addressed with patient including fever, chills, increase sputum production.  Also advised to move up pulmonology appointment to May due to her worsening chronic lung disease.

## 2023-12-05 NOTE — Patient Instructions (Signed)
 I'm glad that you are starting to feel better.   If you being to have worsening shortness of breath, coughing up more mucus, have a fever or chills please come in to be seen.

## 2023-12-05 NOTE — Telephone Encounter (Signed)
error 

## 2023-12-05 NOTE — Progress Notes (Signed)
    SUBJECTIVE:   CHIEF COMPLAINT / HPI:   Andrea Berry is a 58 y.o. female  presenting for follow-up for COPD exacerbation.  Patient was seen in the office given a shot of steroids and increased prednisone to 40 mg for 3 days.  She was also switched to doxycycline for 7-day course and then instructed to switch back to azithromycin maintenance.  She reports good compliance to the medications.  She reports that her breathing has improved but she still has a cough.  She denies fever chills, increased fatigue.  She has an appointment with her pulmonologist in June 2025.  PERTINENT  PMH / PSH: Reviewed and updated   OBJECTIVE:   BP 136/75   Pulse (!) 102   Ht 5\' 1"  (1.549 m)   Wt 175 lb (79.4 kg)   SpO2 100%   BMI 33.07 kg/m   Chronically ill-appearing, no acute distress, legally blind Cardio: Regular rate, regular rhythm, no murmurs on exam. Pulm: Clear, no wheezing, no crackles. No increased work of breathing Abdominal: bowel sounds present, soft, non-tender, non-distended Extremities: no peripheral edema   ASSESSMENT/PLAN:   Bronchiectasis without complication (HCC) Continue current chronic medications.  Strict return precautions addressed with patient including fever, chills, increase sputum production.  Also advised to move up pulmonology appointment to May due to her worsening chronic lung disease.     Glendale Chard, DO [facility]

## 2023-12-06 ENCOUNTER — Telehealth: Payer: Self-pay | Admitting: Family Medicine

## 2023-12-06 ENCOUNTER — Other Ambulatory Visit

## 2023-12-06 ENCOUNTER — Encounter: Payer: Self-pay | Admitting: Family Medicine

## 2023-12-06 LAB — HEPATIC FUNCTION PANEL
ALT: 8 IU/L (ref 0–32)
AST: 12 IU/L (ref 0–40)
Albumin: 4.4 g/dL (ref 3.8–4.9)
Alkaline Phosphatase: 101 IU/L (ref 44–121)
Bilirubin Total: <0.2 mg/dL (ref 0.0–1.2)
Bilirubin, Direct: <0.08 mg/dL (ref 0.00–0.40)
Total Protein: 7 g/dL (ref 6.0–8.5)

## 2023-12-06 NOTE — Telephone Encounter (Signed)
 Error

## 2023-12-09 ENCOUNTER — Other Ambulatory Visit: Payer: Self-pay | Admitting: Family Medicine

## 2023-12-09 DIAGNOSIS — F411 Generalized anxiety disorder: Secondary | ICD-10-CM

## 2023-12-10 ENCOUNTER — Encounter (HOSPITAL_COMMUNITY)
Admission: RE | Admit: 2023-12-10 | Discharge: 2023-12-10 | Disposition: A | Discharge status: Home / Self Care | Payer: Medicare Other | Source: Ambulatory Visit | Attending: Pulmonary Disease | Admitting: Pulmonary Disease

## 2023-12-10 VITALS — Wt 177.9 lb

## 2023-12-10 DIAGNOSIS — J849 Interstitial pulmonary disease, unspecified: Secondary | ICD-10-CM | POA: Insufficient documentation

## 2023-12-10 NOTE — Progress Notes (Signed)
 Daily Session Note  Patient Details  Name: Andrea Berry MRN: [id number] Date of Birth: [birth year] Referring Provider:   Doristine Berry Pulmonary Rehab Walk Test from 10/02/2023 in [facility], [facility]  Referring Provider Namen  [[name]]       Encounter Date: 12/10/2023  Check In:  Session Check In - 12/10/23 1017       Check-In   Supervising physician immediately available to respond to emergencies [facility] MD immediately available    Physician(s) Andrea Favre, PA    Location [facility]    Staff Present Raford Pitcher, MS, ACSM-CEP, Exercise Physiologist;Kaiesha Tonner Erin Sons BS, ACSM-CEP, Exercise Physiologist;Samantha Belarus, RD, Dutch Gray, RN, BSN;Johnny Porter, MS, Exercise Physiologist    Virtual Visit No    Medication changes reported     No    Fall or balance concerns reported    No    Tobacco Cessation No Change    Warm-up and Cool-down Performed as group-led instruction    Resistance Training Performed Yes    VAD Patient? No    PAD/SET Patient? No      Pain Assessment   Currently in Pain? No/denies    Pain Score 0-No pain    Multiple Pain Sites No             Capillary Blood Glucose: No results found for this or any previous visit (from the past 24 hours).   Exercise Prescription Changes - 12/10/23 1100       Response to Exercise   Blood Pressure (Admit) 128/78    Blood Pressure (Exercise) 136/80    Blood Pressure (Exit) 122/80    Heart Rate (Admit) 94 bpm    Heart Rate (Exercise) 106 bpm    Heart Rate (Exit) 100 bpm    Oxygen Saturation (Admit) 99 %    Oxygen Saturation (Exercise) 97 %    Oxygen Saturation (Exit) 99 %    Rating of Perceived Exertion (Exercise) 13    Perceived Dyspnea (Exercise) 0    Duration Continue with 30 min of aerobic exercise without signs/symptoms of physical distress.    Intensity THRR unchanged      Progression   Progression Continue to  progress workloads to maintain intensity without signs/symptoms of physical distress.      Resistance Training   Training Prescription Yes    Weight red bands    Reps 10-15    Time 10 Minutes      NuStep   Level 4    Minutes 15    METs 2.2      Track   Laps 8    Minutes 15    METs 2.23             Social History   Tobacco Use  Smoking Status Never  Smokeless Tobacco Never    Goals Met:  Proper associated with RPD/PD & O2 Sat Independence with exercise equipment Exercise tolerated well No report of concerns or symptoms today Strength training completed today  Goals Unmet:  Not Applicable  Comments: Service time is from 1011 to 1136.    Dr. Mechele Collin is Medical Director for Pulmonary Rehab at [facility].

## 2023-12-12 ENCOUNTER — Encounter (HOSPITAL_COMMUNITY)
Admission: RE | Admit: 2023-12-12 | Discharge: 2023-12-12 | Disposition: A | Discharge status: Home / Self Care | Payer: Medicare Other | Source: Ambulatory Visit | Attending: Pulmonary Disease | Admitting: Pulmonary Disease

## 2023-12-12 DIAGNOSIS — J849 Interstitial pulmonary disease, unspecified: Secondary | ICD-10-CM

## 2023-12-12 NOTE — Progress Notes (Signed)
 Daily Session Note  Patient Details  Name: Kura Bethards MRN: [id number] Date of Birth: 07/24/1966 Referring Provider:   Doristine Devoid Pulmonary Rehab Walk Test from 10/02/2023 in [facility] for [facility], Vascular, & [facility]  Referring Provider Namen  [[name]]       Encounter Date: 12/12/2023  Check In:  Session Check In - 12/12/23 1028       Check-In   Supervising physician immediately available to respond to emergencies [facility] MD immediately available    Physician(s) Carlyon Shadow, NP    [facility]    Staff Present Raford Pitcher, MS, ACSM-CEP, Exercise Physiologist;Benjermin Korber Erin Sons BS, ACSM-CEP, Exercise Physiologist;Samantha Belarus, RD, Dutch Gray, RN, BSN    Virtual Visit No    Medication changes reported     No    Fall or balance concerns reported    No    Tobacco Cessation No Change    Warm-up and Cool-down Performed as group-led Writer Performed Yes    VAD Patient? No    PAD/SET Patient? No      Pain Assessment   Currently in Pain? No/denies             Capillary Blood Glucose: No results found for this or any previous visit (from the past 24 hours).    Social History   Tobacco Use  Smoking Status Never  Smokeless Tobacco Never    Goals Met:  Proper associated with RPD/PD & O2 Sat Independence with exercise equipment Exercise tolerated well No report of concerns or symptoms today Strength training completed today  Goals Unmet:  Not Applicable  Comments: Service time is from 1007 to 1131.    Dr. Mechele Collin is Medical Director for Pulmonary Rehab at [facility].

## 2023-12-17 ENCOUNTER — Encounter (HOSPITAL_COMMUNITY)
Admission: RE | Admit: 2023-12-17 | Discharge: 2023-12-17 | Disposition: A | Discharge status: Home / Self Care | Payer: Medicare Other | Source: Ambulatory Visit | Attending: Pulmonary Disease | Admitting: Pulmonary Disease

## 2023-12-17 DIAGNOSIS — J849 Interstitial pulmonary disease, unspecified: Secondary | ICD-10-CM

## 2023-12-17 NOTE — Progress Notes (Signed)
 Daily Session Note  Patient Details  Name: Andrea Berry MRN: [id number] Date of Birth: [birth year] Referring Provider:   Doristine Devoid Pulmonary Rehab Walk Test from 10/02/2023 in [facility], Vascular, [facility]  Referring Provider Namen  [[name]]       Encounter Date: 12/17/2023  Check In:  Session Check In - 12/17/23 1109       Check-In   Supervising physician immediately available to respond to emergencies [facility] MD immediately available    Physician(s) Rise Paganini, NP    [facility]    Staff Present Raford Pitcher, MS, ACSM-CEP, Exercise Physiologist;Zadaya Cuadra Erin Sons BS, ACSM-CEP, Exercise Physiologist;Samantha Belarus, RD, Dutch Gray, RN, BSN    Virtual Visit No    Medication changes reported     No    Fall or balance concerns reported    No    Tobacco Cessation No Change    Warm-up and Cool-down Performed as group-led Writer Performed Yes    VAD Patient? No    PAD/SET Patient? No      Pain Assessment   Currently in Pain? No/denies    Multiple Pain Sites No             Capillary Blood Glucose: No results found for this or any previous visit (from the past 24 hours).    Social History   Tobacco Use  Smoking Status Never  Smokeless Tobacco Never    Goals Met:  Proper associated with RPD/PD & O2 Sat Independence with exercise equipment Exercise tolerated well No report of concerns or symptoms today Strength training completed today  Goals Unmet:  Not Applicable  Comments: Service time is from 1014 to 1116.    Dr. Mechele Collin is Medical Director for Pulmonary Rehab at [facility].

## 2023-12-19 ENCOUNTER — Encounter (HOSPITAL_COMMUNITY)
Admission: RE | Admit: 2023-12-19 | Discharge: 2023-12-19 | Disposition: A | Discharge status: Home / Self Care | Payer: Medicare Other | Source: Ambulatory Visit | Attending: Pulmonary Disease | Admitting: Pulmonary Disease

## 2023-12-19 DIAGNOSIS — J849 Interstitial pulmonary disease, unspecified: Secondary | ICD-10-CM | POA: Diagnosis not present

## 2023-12-19 NOTE — Progress Notes (Signed)
 Daily Session Note  Patient Details  Name: Andrea Berry MRN: [id number] Date of Birth: 20-Sep-1965 Referring Provider:   Doristine Devoid Pulmonary Rehab Walk Test from 10/02/2023 in [facility] for Heart, Vascular, & Lung Health  Referring Provider Namen  [[name]]       Encounter Date: 12/19/2023  Check In:  Session Check In - 12/19/23 1028       Check-In   Supervising physician immediately available to respond to emergencies [facility] MD immediately available    Physician(s) Robin Searing NP    [facility]    Staff Present Raford Pitcher, MS, ACSM-CEP, Exercise Physiologist;Landa Mullinax Erin Sons BS, ACSM-CEP, Exercise Physiologist;Samantha Belarus, RD, Dutch Gray, RN, BSN    Virtual Visit No    Medication changes reported     No    Fall or balance concerns reported    No    Tobacco Cessation No Change    Warm-up and Cool-down Performed as group-led Writer Performed Yes    VAD Patient? No    PAD/SET Patient? No      Pain Assessment   Currently in Pain? No/denies             Capillary Blood Glucose: No results found for this or any previous visit (from the past 24 hours).    Social History   Tobacco Use  Smoking Status Never  Smokeless Tobacco Never    Goals Met:  Proper associated with RPD/PD & O2 Sat Independence with exercise equipment Exercise tolerated well No report of concerns or symptoms today Strength training completed today  Goals Unmet:  Not Applicable  Comments: Service time is from 1008 to 1136.    Dr. Mechele Collin is Medical Director for Pulmonary Rehab at [facility].

## 2023-12-24 ENCOUNTER — Encounter (HOSPITAL_COMMUNITY)
Admission: RE | Admit: 2023-12-24 | Discharge: 2023-12-24 | Disposition: A | Discharge status: Home / Self Care | Payer: Medicare Other | Source: Ambulatory Visit | Attending: Pulmonary Disease | Admitting: Pulmonary Disease

## 2023-12-24 VITALS — Wt 178.1 lb

## 2023-12-24 DIAGNOSIS — J849 Interstitial pulmonary disease, unspecified: Secondary | ICD-10-CM | POA: Diagnosis not present

## 2023-12-24 NOTE — Progress Notes (Signed)
 Daily Session Note  Patient Details  Name: Andrea Berry MRN: [id number] Date of Birth: 05-Jul-1966 Referring Provider:   [name] Kass Pulmonary Rehab Walk Test from 10/02/2023 in [facility] for [facility], Vascular, & Lung [facility]  Referring Provider Namen  [[name]]       Encounter Date: 12/24/2023  Check In:  Session Check In - 12/24/23 1033       Check-In   Supervising physician immediately available to respond to emergencies [facility] MD immediately available    Physician(s) Palmer Bobo, NP    [facility]    Staff Present Atlas Lea, MS, ACSM-CEP, Exercise Physiologist;Stefanos Haynesworth Mayme Spearman BS, ACSM-CEP, Exercise Physiologist;Mary Arlester Ladd, RN, BSN    Virtual Visit No    Medication changes reported     No    Fall or balance concerns reported    No    Tobacco Cessation No Change    Warm-up and Cool-down Performed as group-led instruction    Resistance Training Performed Yes    VAD Patient? No    PAD/SET Patient? No      Pain Assessment   Currently in Pain? No/denies    Multiple Pain Sites No             Capillary Blood Glucose: No results found for this or any previous visit (from the past 24 hours).   Exercise Prescription Changes - 12/24/23 1200       Response to Exercise   Blood Pressure (Admit) 128/80    Blood Pressure (Exercise) 160/70    Blood Pressure (Exit) 134/82    Heart Rate (Admit) 96 bpm    Heart Rate (Exercise) 113 bpm    Heart Rate (Exit) 105 bpm    Oxygen Saturation (Admit) 97 %    Oxygen Saturation (Exercise) 95 %    Oxygen Saturation (Exit) 97 %    Rating of Perceived Exertion (Exercise) 11    Perceived Dyspnea (Exercise) 0    Duration Continue with 30 min of aerobic exercise without signs/symptoms of physical distress.    Intensity THRR unchanged      Progression   Progression Continue to progress workloads to maintain intensity without signs/symptoms of physical distress.       Resistance Training   Training Prescription Yes    Weight red bands    Reps 10-15    Time 10 Minutes      NuStep   Level 4    Minutes 15    METs 2.4      Track   Laps 13    Minutes 15    METs 3             Social History   Tobacco Use  Smoking Status Never  Smokeless Tobacco Never    Goals Met:  Proper associated with RPD/PD & O2 Sat Independence with exercise equipment Exercise tolerated well No report of concerns or symptoms today Strength training completed today  Goals Unmet:  Not Applicable  Comments: Service time is from 1008 to 1133.    Dr. Genetta Kenning is Medical Director for Pulmonary Rehab at [facility].

## 2023-12-25 NOTE — Progress Notes (Signed)
 Pulmonary Individual Treatment Plan  Patient Details  Name: Andrea Berry MRN: [id number] Date of Birth: [birth year] Referring Provider:   Doristine Berry Pulmonary Rehab Walk Test from 10/02/2023 in [facility]  Referring Provider Namen  [Andrea Berry]       Initial Encounter Date:  Flowsheet Row Pulmonary Rehab Walk Test from 10/02/2023 in [facility] for Heart, V[facility], & Lung [facility]  Date 10/02/23       Visit Diagnosis: Interstitial lung disease (HCC)  Patient's Home Medications on Admission:   Current Outpatient Medications:    albuterol (VENTOLIN HFA) 108 (90 Base) MCG/ACT inhaler, Inhale 2 puffs into the lungs every 6 (six) hours as needed for wheezing or shortness of breath., Disp: 18 each, Rfl: 0   alendronate (FOSAMAX) 70 MG tablet, Take 1 tablet (70 mg total) by mouth every Friday., Disp: 12 tablet, Rfl: 3   ALTAMIST SPRAY 0.65 % nasal spray, Place 2 sprays into the nose 2 (two) times daily., Disp: , Rfl:    amitriptyline (ELAVIL) 10 MG tablet, Take 1 tablet (10 mg total) by mouth daily as needed (Headache)., Disp: , Rfl:    atorvastatin (LIPITOR) 40 MG tablet, Take 1 tablet (40 mg total) by mouth daily., Disp: 90 tablet, Rfl: 0   azithromycin (ZITHROMAX) 250 MG tablet, Take 1 tablet by mouth daily., Disp: , Rfl:    Cholecalciferol (VITAMIN D3) 50 MCG (2000 UT) TABS, Take 2 tablets by mouth daily., Disp: , Rfl:    diltiazem (CARDIZEM CD) 180 MG 24 hr capsule, Take 1 capsule (180 mg total) by mouth daily., Disp: 90 capsule, Rfl: 0   DULoxetine (CYMBALTA) 60 MG capsule, TAKE 1 CAPSULE BY MOUTH EVERY DAY, Disp: 90 capsule, Rfl: 3   esomeprazole (NEXIUM) 20 MG capsule, Take 1 capsule (20 mg total) by mouth daily at 12 noon., Disp: 90 capsule, Rfl: 0   gabapentin (NEURONTIN) 300 MG capsule, Take 2 capsules (600 mg total) by mouth 2 (two) times daily., Disp: , Rfl:    linaclotide (LINZESS) 145 MCG  CAPS capsule, Take 1 capsule (145 mcg total) by mouth daily before breakfast., Disp: 90 capsule, Rfl: 0   LORazepam (ATIVAN) 0.5 MG tablet, TAKE 1 TABLET (0.5 MG TOTAL) BY MOUTH TWO (TWO) TIMES DAILY, Disp: 40 tablet, Rfl: 2   ondansetron (ZOFRAN) 4 MG tablet, Take 1 tablet (4 mg total) by mouth every 8 (eight) hours as needed for nausea or vomiting., Disp: 30 tablet, Rfl: 3   OXcarbazepine (TRILEPTAL) 150 MG tablet, Take 300 mg by mouth 2 (two) times daily., Disp: , Rfl:    Oxcarbazepine (TRILEPTAL) 300 MG tablet, Take 300 mg by mouth daily., Disp: , Rfl:    Pirfenidone 267 MG TABS, Take by mouth., Disp: , Rfl:    predniSONE (DELTASONE) 10 MG tablet, Take 0.5 tablets (5 mg total) by mouth daily., Disp: 45 tablet, Rfl: 0   RiTUXimab (RITUXAN IV), Inject 1,000 mg into the vein See admin instructions. 1,000 mg into the vein every 6 months, then two weeks later another 1,000 mg injection, Disp: , Rfl:    rivaroxaban (XARELTO) 10 MG TABS tablet, Take 1 tablet (10 mg total) by mouth daily., Disp: 90 tablet, Rfl: 3   tiotropium (SPIRIVA HANDIHALER) 18 MCG inhalation capsule, Place 1 capsule (18 mcg total) into inhaler and inhale daily., Disp: 30 capsule, Rfl: 0   traMADol (ULTRAM) 50 MG tablet, Take 1 tablet (50 mg total) by mouth every  6 (six) hours as needed., Disp: 28 tablet, Rfl: 0   traZODone (DESYREL) 50 MG tablet, Take 0.5-1 tablets (25-50 mg total) by mouth at bedtime as needed for sleep., Disp: 30 tablet, Rfl: 3  Past Medical History: Past Medical History:  Diagnosis Date   Acute left hemiparesis on chronic left hemiparesis 04/12/2022   Acute respiratory distress syndrome (ARDS) due to COVID-19 virus (HCC) 03/27/2019   Acute respiratory failure with hypoxia (HCC) 02/16/2020   Anemia of chronic disease 10/28/2015   Anxiety 07/12/2018   Arthritis    Ataxia 01/03/2016   Blind    Blindness of both eyes 04/12/2022   CAP (community acquired pneumonia) 04/07/2023   Chronic central neuropathic  pain 10/02/2018   Dx by [facility] 09/2017   Chronic coughing 09/10/2016   Chronic pain syndrome 07/12/2018   Chronic respiratory failure with hypoxia (HCC)    Chronic rhinitis 01/07/2019   COVID-19 02/04/2020   Decreased strength 10/08/2018   Proximal leg muscles   Depression    Depression with anxiety 01/03/2016   Devic's disease (HCC)    Dysesthesia 01/03/2016   Dyspnea on exertion 11/07/2018   Elevated BP without diagnosis of hypertension 08/28/2019   Report of [facility] nurse in December with normal SBPs but DBPs in low 90s    Eustachian tube dysfunction 11/18/2018   L>R. Followed by [facility] ENT.   Eustachian tube dysfunction, bilateral 09/18/2018   Eustachian tube dysfunction, left 11/13/2018   L>R. Followed by [facility] ENT.   Fall 05/10/2023   GERD (gastroesophageal reflux disease)    Greater trochanteric bursitis of left hip 04/27/2020   Formatting of this note might be different from the original. Added automatically from request for surgery [id number]  Formatting of this note might be different from the original. Added automatically from request for surgery [id number]   Headache(784.0)    History of chicken pox 10/08/2018   History of COVID-19 02/16/2020   History of CVA (cerebrovascular accident)    History of kidney stones    History of pulmonary embolism 12/10/2018   Lupus Antiphosholipid (+). Patient on secondary prophylaxis with rivaroxaban 20 mg daily long-term.     Hypertension    Hypertrophic cardiomyopathy (HCC) 03/02/2020   Hyponatremia 11/25/2015   Impairment of balance 10/08/2018   Kidney stone 09/14/2022   Legal blindness 01/30/2014   Liver masses 04/13/2022   MRI Abdomen 11/06/22 [facility]: Focus of arterial phase hyperenhancement in the right hepatic lobe  is consistent with a vascular shunt. No evidence of hepatic mass. Cholelithiasis. No radiographic evidence of cholecystitis or biliary  ductal dilatation.      PET Scan 04/10/22 by Pulmonology for work-up of  Solitary Pulmonary nodule found "Multiple hypermetabolic hepatic mass lesions. Hepatic metastasis    Long-term corticosteroid use 10/08/2018   Lung nodule 05/24/2022   07/05/22 [facility] PET Scan: 1.  Interval decrease in size of a linear left lower lobe pulmonary nodule without radiotracer uptake, favored scarring in the setting of prior infection. No new pulmonary nodule.  2.  Waxing and waning nonspecific scattered hypermetabolic nodularity throughout both lungs with decreased metabolic activity of bilateral hilar lymph nodes and stable to slightly increased hepat   Lupus    Lupus anticoagulant disorder (HCC) 10/02/2018   Pulmonary Embolism 2017   Lupus cerebritis (HCC) 11/25/2015   Possible explanation of 11/25/2015 acute hyperintensity findings on Brain MRI.    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   Orthostatic dizziness, Recurrent 10/08/2018   Associated with  medications   Otalgia 07/12/2018   Personal history of immunosupression therapy 10/08/2018   Chronic Azathioprine and prednisone therapy for Devic's disease   Pigmented skin lesion of uncertain nature 11/06/2018   Pneumonia 02/21/2020   Pneumonia of right lower lobe due to infectious organism 02/21/2020   Postmenopause 10/21/2020   Pulmonary embolism (HCC) 2017   Shortness of breath    due to medication   Skin lesion of chest wall 11/07/2018   SLE (systemic lupus erythematosus related syndrome) (HCC) 11/07/2018   Smith antibody positive 04/07/2019   Stroke (HCC) 2008   visually impaired   Stroke (HCC) 04/13/2022   Syncope    Transaminasemia 10/21/2020   Ureteral calculi 2018   Vaginal atrophy 09/14/2022   Vaginal odor 06/29/2022   Vitamin D deficiency 10/06/2018    Tobacco Use: Social History   Tobacco Use  Smoking Status Never  Smokeless Tobacco Never    Labs: Review Flowsheet  More data exists      Latest Ref Rng & Units 05/06/2020 10/17/2021 04/12/2022 01/03/2023 08/07/2023   Labs for ITP Cardiac and Pulmonary Rehab  Cholestrol 0 - 200 - 236  - - -  LDL (calc) - - 124  - - -  Direct LDL 0 - 99 mg/dL - - 67  - -  HDL-C 35 - 70 - 68  - - -  Trlycerides 40 - 160 - 289  - - -  Hemoglobin A1c 4.0 - 5.6 % 5.1%  - 6.3  6.1  6.7   TCO2 22 - 32 mmol/L - - 23  - -    Capillary Blood Glucose: Lab Results  Component Value Date   GLUCAP 100 (H) 04/12/2022   GLUCAP 68 (L) 04/12/2022   GLUCAP 272 (H) 04/12/2022   GLUCAP 58 (L) 04/12/2022   GLUCAP 130 (H) 03/05/2021     Pulmonary Assessment Scores:  Pulmonary Assessment Scores     Row Name 10/02/23 0958 12/19/23 1158       ADL UCSD   ADL Phase Entry Exit    SOB Score total 68 51      CAT Score   CAT Score 24 21      mMRC Score   mMRC Score 4 --            UCSD: Self-administered rating of dyspnea associated with activities of daily living (ADLs) 6-point scale (0 = "not at all" to 5 = "maximal or unable to do because of breathlessness")  Scoring Scores range from 0 to 120.  Minimally important difference is 5 units  CAT: CAT can identify the health impairment of COPD patients and is better correlated with disease progression.  CAT has a scoring range of zero to 40. The CAT score is classified into four groups of low (less than 10), medium (10 - 20), high (21-30) and very high (31-40) based on the impact level of disease on health status. A CAT score over 10 suggests significant symptoms.  A worsening CAT score could be explained by an exacerbation, poor medication adherence, poor inhaler technique, or progression of COPD or comorbid conditions.  CAT MCID is 2 points  mMRC: mMRC (Modified Medical Research Council) Dyspnea Scale is used to assess the degree of baseline functional disability in patients of respiratory disease due to dyspnea. No minimal important difference is established. A decrease in score of 1 point or greater is considered a positive change.   Pulmonary Function Assessment:   Pulmonary Function Assessment - 10/02/23 0957  Breath   Shortness of Breath Yes;Panic with Shortness of Breath;Fear of Shortness of Breath;Limiting activity             Exercise Target Goals: Exercise Program Goal: Individual exercise prescription set using results from initial 6 min walk test and THRR while considering  patient's activity barriers and safety.   Exercise Prescription Goal: Initial exercise prescription builds to 30-45 minutes a day of aerobic activity, 2-3 days per week.  Home exercise guidelines will be given to patient during program as part of exercise prescription that the participant will acknowledge.  Activity Barriers & Risk Stratification:  Activity Barriers & Cardiac Risk Stratification - 10/02/23 0959       Activity Barriers & Cardiac Risk Stratification   Activity Barriers Arthritis;Back Problems;Neck/Spine Problems;History of Falls;Balance Concerns;Shortness of Breath;Muscular Weakness;Deconditioning;Joint Problems             6 Minute Walk:  6 Minute Walk     Row Name 10/02/23 1102         6 Minute Walk   Phase Initial     Distance 485 feet     Walk Time 6 minutes     # of Rest Breaks 1  5:30-5:50     MPH 0.92     METS 2.05     RPE 12     Perceived Dyspnea  0     VO2 Peak 7.16     Symptoms No     Resting HR 107 bpm     Resting BP 122/74     Resting Oxygen Saturation  100 %     Exercise Oxygen Saturation  during 6 min walk 94 %     Max Ex. HR 122 bpm     Max Ex. BP 118/74     2 Minute Post BP 116/70       Interval HR   1 Minute HR 115     2 Minute HR 111     3 Minute HR 109     4 Minute HR 111     5 Minute HR 122     6 Minute HR 114     2 Minute Post HR 99     Interval Heart Rate? Yes       Interval Oxygen   Interval Oxygen? Yes     Baseline Oxygen Saturation % 100 %     1 Minute Oxygen Saturation % 100 %     1 Minute Liters of Oxygen 0 L     2 Minute Oxygen Saturation % 98 %     2 Minute Liters of Oxygen 0 L      3 Minute Oxygen Saturation % 99 %     3 Minute Liters of Oxygen 0 L     4 Minute Oxygen Saturation % 99 %     4 Minute Liters of Oxygen 0 L     5 Minute Oxygen Saturation % 94 %     5 Minute Liters of Oxygen 0 L     6 Minute Oxygen Saturation % 98 %     6 Minute Liters of Oxygen 0 L     2 Minute Post Oxygen Saturation % 100 %     2 Minute Post Liters of Oxygen 0 L              Oxygen Initial Assessment:  Oxygen Initial Assessment - 10/02/23 0937       Home Oxygen   Home Oxygen Device None  Sleep Oxygen Prescription None    Home Exercise Oxygen Prescription None    Home Resting Oxygen Prescription None      Initial 6 min Walk   Oxygen Used None      Program Oxygen Prescription   Program Oxygen Prescription None      Intervention   Short Term Goals To learn and exhibit compliance with exercise, home and travel O2 prescription;To learn and understand importance of maintaining oxygen saturations>88%;To learn and demonstrate proper use of respiratory medications;To learn and understand importance of monitoring SPO2 with pulse oximeter and demonstrate accurate use of the pulse oximeter.;To learn and demonstrate proper pursed lip breathing techniques or other breathing techniques.     Long  Term Goals Exhibits compliance with exercise, home  and travel O2 prescription;Verbalizes importance of monitoring SPO2 with pulse oximeter and return demonstration;Maintenance of O2 saturations>88%;Exhibits proper breathing techniques, such as pursed lip breathing or other method taught during program session;Compliance with respiratory medication;Demonstrates proper use of MDI's             Oxygen Re-Evaluation:  Oxygen Re-Evaluation     Row Name 10/02/23 1191 10/21/23 1216 11/20/23 1206 12/18/23 1107       Program Oxygen Prescription   Program Oxygen Prescription -- None None None      Home Oxygen   Home Oxygen Device -- None None None    Sleep Oxygen Prescription -- None  None None    Home Exercise Oxygen Prescription -- None None None    Home Resting Oxygen Prescription -- None None None      Goals/Expected Outcomes   Short Term Goals -- To learn and exhibit compliance with exercise, home and travel O2 prescription;To learn and understand importance of maintaining oxygen saturations>88%;To learn and demonstrate proper use of respiratory medications;To learn and understand importance of monitoring SPO2 with pulse oximeter and demonstrate accurate use of the pulse oximeter.;To learn and demonstrate proper pursed lip breathing techniques or other breathing techniques.  To learn and exhibit compliance with exercise, home and travel O2 prescription;To learn and understand importance of maintaining oxygen saturations>88%;To learn and demonstrate proper use of respiratory medications;To learn and understand importance of monitoring SPO2 with pulse oximeter and demonstrate accurate use of the pulse oximeter.;To learn and demonstrate proper pursed lip breathing techniques or other breathing techniques.  To learn and exhibit compliance with exercise, home and travel O2 prescription;To learn and understand importance of maintaining oxygen saturations>88%;To learn and demonstrate proper use of respiratory medications;To learn and understand importance of monitoring SPO2 with pulse oximeter and demonstrate accurate use of the pulse oximeter.;To learn and demonstrate proper pursed lip breathing techniques or other breathing techniques.     Long  Term Goals -- Exhibits compliance with exercise, home  and travel O2 prescription;Verbalizes importance of monitoring SPO2 with pulse oximeter and return demonstration;Maintenance of O2 saturations>88%;Exhibits proper breathing techniques, such as pursed lip breathing or other method taught during program session;Compliance with respiratory medication;Demonstrates proper use of MDI's Exhibits compliance with exercise, home  and travel O2  prescription;Verbalizes importance of monitoring SPO2 with pulse oximeter and return demonstration;Maintenance of O2 saturations>88%;Exhibits proper breathing techniques, such as pursed lip breathing or other method taught during program session;Compliance with respiratory medication;Demonstrates proper use of MDI's Exhibits compliance with exercise, home  and travel O2 prescription;Verbalizes importance of monitoring SPO2 with pulse oximeter and return demonstration;Maintenance of O2 saturations>88%;Exhibits proper breathing techniques, such as pursed lip breathing or other method taught during program session;Compliance with respiratory medication;Demonstrates proper use  of MDI's    Goals/Expected Outcomes Compliance and understanding of oxygen saturation monitoring and breathing techniques to decrease shortness of breath. Compliance and understanding of oxygen saturation monitoring and breathing techniques to decrease shortness of breath. Compliance and understanding of oxygen saturation monitoring and breathing techniques to decrease shortness of breath. Compliance and understanding of oxygen saturation monitoring and breathing techniques to decrease shortness of breath.             Oxygen Discharge (Final Oxygen Re-Evaluation):  Oxygen Re-Evaluation - 12/18/23 1107       Program Oxygen Prescription   Program Oxygen Prescription None      Home Oxygen   Home Oxygen Device None    Sleep Oxygen Prescription None    Home Exercise Oxygen Prescription None    Home Resting Oxygen Prescription None      Goals/Expected Outcomes   Short Term Goals To learn and exhibit compliance with exercise, home and travel O2 prescription;To learn and understand importance of maintaining oxygen saturations>88%;To learn and demonstrate proper use of respiratory medications;To learn and understand importance of monitoring SPO2 with pulse oximeter and demonstrate accurate use of the pulse oximeter.;To learn and  demonstrate proper pursed lip breathing techniques or other breathing techniques.     Long  Term Goals Exhibits compliance with exercise, home  and travel O2 prescription;Verbalizes importance of monitoring SPO2 with pulse oximeter and return demonstration;Maintenance of O2 saturations>88%;Exhibits proper breathing techniques, such as pursed lip breathing or other method taught during program session;Compliance with respiratory medication;Demonstrates proper use of MDI's    Goals/Expected Outcomes Compliance and understanding of oxygen saturation monitoring and breathing techniques to decrease shortness of breath.             Initial Exercise Prescription:  Initial Exercise Prescription - 10/02/23 1100       Date of Initial Exercise RX and Referring Provider   Date 10/02/23    Referring Provider Jari Merles   Expected Discharge Date 12/26/23      NuStep   Level 1    SPM 50    Minutes 20    METs 1.5      Prescription Details   Frequency (times per week) 2    Duration Progress to 30 minutes of continuous aerobic without signs/symptoms of physical distress      Intensity   THRR 40-80% of Max Heartrate 66-132    Ratings of Perceived Exertion 11-13    Perceived Dyspnea 0-4      Progression   Progression Continue to progress workloads to maintain intensity without signs/symptoms of physical distress.      Resistance Training   Training Prescription Yes    Weight red bands    Reps 10-15             Perform Capillary Blood Glucose checks as needed.  Exercise Prescription Changes:   Exercise Prescription Changes     Row Name 10/15/23 1200 10/29/23 1200 11/12/23 1100 11/26/23 1100 12/03/23 1100     Response to Exercise   Blood Pressure (Admit) 120/70 108/72 124/70 122/80 --   Blood Pressure (Exercise) 128/80 138/70 136/88 124/66 --   Blood Pressure (Exit) 124/70 128/72 130/68 112/66 --   Heart Rate (Admit) 85 bpm 94 bpm 93 bpm 91 bpm --   Heart Rate (Exercise)  86 bpm 87 bpm 106 bpm 93 bpm --   Heart Rate (Exit) 93 bpm 86 bpm 103 bpm 100 bpm --   Oxygen Saturation (Admit) 100 % 98 %  98 % 99 % --   Oxygen Saturation (Exercise) 98 % 96 % 96 % 99 % --   Oxygen Saturation (Exit) 99 % 98 % 97 % 98 % --   Rating of Perceived Exertion (Exercise) 11 12 11 11  --   Perceived Dyspnea (Exercise) 0 0 0 0 --   Duration Continue with 30 min of aerobic exercise without signs/symptoms of physical distress. Continue with 30 min of aerobic exercise without signs/symptoms of physical distress. Continue with 30 min of aerobic exercise without signs/symptoms of physical distress. Continue with 30 min of aerobic exercise without signs/symptoms of physical distress. --   Intensity THRR unchanged THRR unchanged THRR unchanged THRR unchanged --     Progression   Progression Continue to progress workloads to maintain intensity without signs/symptoms of physical distress. Continue to progress workloads to maintain intensity without signs/symptoms of physical distress. Continue to progress workloads to maintain intensity without signs/symptoms of physical distress. Continue to progress workloads to maintain intensity without signs/symptoms of physical distress. --     Paramedic Prescription Yes Yes Yes Yes --   Weight red bands red bands red bands red bands --   Reps 10-15 10-15 10-15 10-15 --   Time 10 Minutes 10 Minutes 10 Minutes 10 Minutes --     NuStep   Level 1 3 3 3  --   SPM 76 -- -- -- --   Minutes 30 15 15 15  --   METs 1.6 2 2  1.5 --     Track   Laps -- 14 15 -- --   Minutes -- 15 15 -- --   METs -- 3.15 2 -- --     Home Exercise Plan   Plans to continue exercise at -- -- -- -- Home (comment)   Frequency -- -- -- -- Add 2 additional days to program exercise sessions.   Initial Home Exercises Provided -- -- -- -- 12/03/23    Row Name 12/10/23 1100 12/24/23 1200           Response to Exercise   Blood Pressure (Admit) 128/78 128/80       Blood Pressure (Exercise) 136/80 160/70      Blood Pressure (Exit) 122/80 134/82      Heart Rate (Admit) 94 bpm 96 bpm      Heart Rate (Exercise) 106 bpm 113 bpm      Heart Rate (Exit) 100 bpm 105 bpm      Oxygen Saturation (Admit) 99 % 97 %      Oxygen Saturation (Exercise) 97 % 95 %      Oxygen Saturation (Exit) 99 % 97 %      Rating of Perceived Exertion (Exercise) 13 11      Perceived Dyspnea (Exercise) 0 0      Duration Continue with 30 min of aerobic exercise without signs/symptoms of physical distress. Continue with 30 min of aerobic exercise without signs/symptoms of physical distress.      Intensity THRR unchanged THRR unchanged        Progression   Progression Continue to progress workloads to maintain intensity without signs/symptoms of physical distress. Continue to progress workloads to maintain intensity without signs/symptoms of physical distress.        Resistance Training   Training Prescription Yes Yes      Weight red bands red bands      Reps 10-15 10-15      Time 10 Minutes 10 Minutes  NuStep   Level 4 4      Minutes 15 15      METs 2.2 2.4        Track   Laps 8 13      Minutes 15 15      METs 2.23 3               Exercise Comments:   Exercise Comments     Row Name 10/08/23 1507 12/03/23 1146         Exercise Comments Pt completed first day of exercise. Andrea Berry exercised for 25 min on the Nustep. She averaged 1.1 METs at level 1 on the Nustep. She was unable to perform the warmup due to arriving late but did the cooldown. Andrea Berry needs 1 on 1 assistance due to her vision impairment. Discussed progression with her and how I will eventually progress her. Andrea Berry voiced understanding. Completed home exercise plan. Andrea Berry is not consistently exercising at home. I encouraged her to start walking 2 non-rehab days/wk for 30 min/day. Andrea Berry has impaired vision, so I recommended walking around her house or walking outside with her son. She  agreed with my recommendations. Andrea Berry seems motivated to exercise and maintain her current health.               Exercise Goals and Review:   Exercise Goals     Row Name 10/02/23 0936             Exercise Goals   Increase Physical Activity Yes       Intervention Provide advice, education, support and counseling about physical activity/exercise needs.;Develop an individualized exercise prescription for aerobic and resistive training based on initial evaluation findings, risk stratification, comorbidities and participant's personal goals.       Expected Outcomes Short Term: Attend rehab on a regular basis to increase amount of physical activity.;Long Term: Exercising regularly at least 3-5 days a week.;Long Term: Add in home exercise to make exercise part of routine and to increase amount of physical activity.       Increase Strength and Stamina Yes       Intervention Provide advice, education, support and counseling about physical activity/exercise needs.;Develop an individualized exercise prescription for aerobic and resistive training based on initial evaluation findings, risk stratification, comorbidities and participant's personal goals.       Expected Outcomes Short Term: Increase workloads from initial exercise prescription for resistance, speed, and METs.;Short Term: Perform resistance training exercises routinely during rehab and add in resistance training at home;Long Term: Improve cardiorespiratory fitness, muscular endurance and strength as measured by increased METs and functional capacity ( )       Able to understand and use rate of perceived exertion (RPE) scale Yes       Intervention Provide education and explanation on how to use RPE scale       Expected Outcomes Short Term: Able to use RPE daily in rehab to express subjective intensity level;Long Term:  Able to use RPE to guide intensity level when exercising independently       Able to understand and use Dyspnea  scale Yes       Intervention Provide education and explanation on how to use Dyspnea scale       Expected Outcomes Short Term: Able to use Dyspnea scale daily in rehab to express subjective sense of shortness of breath during exertion;Long Term: Able to use Dyspnea scale to guide intensity level when exercising independently       Knowledge  and understanding of Target Heart Rate Range (THRR) Yes       Intervention Provide education and explanation of THRR including how the numbers were predicted and where they are located for reference       Expected Outcomes Short Term: Able to state/look up THRR;Long Term: Able to use THRR to govern intensity when exercising independently;Short Term: Able to use daily as guideline for intensity in rehab       Understanding of Exercise Prescription Yes       Intervention Provide education, explanation, and written materials on patient's individual exercise prescription       Expected Outcomes Short Term: Able to explain program exercise prescription;Long Term: Able to explain home exercise prescription to exercise independently                Exercise Goals Re-Evaluation :  Exercise Goals Re-Evaluation     Row Name 10/02/23 4782 10/21/23 1211 11/20/23 1200 12/18/23 1102       Exercise Goal Re-Evaluation   Exercise Goals Review Increase Physical Activity;Able to understand and use Dyspnea scale;Understanding of Exercise Prescription;Increase Strength and Stamina;Knowledge and understanding of Target Heart Rate Range (THRR);Able to understand and use rate of perceived exertion (RPE) scale Increase Physical Activity;Able to understand and use Dyspnea scale;Understanding of Exercise Prescription;Increase Strength and Stamina;Knowledge and understanding of Target Heart Rate Range (THRR);Able to understand and use rate of perceived exertion (RPE) scale Increase Physical Activity;Able to understand and use Dyspnea scale;Understanding of Exercise  Prescription;Increase Strength and Stamina;Knowledge and understanding of Target Heart Rate Range (THRR);Able to understand and use rate of perceived exertion (RPE) scale Increase Physical Activity;Able to understand and use Dyspnea scale;Understanding of Exercise Prescription;Increase Strength and Stamina;Knowledge and understanding of Target Heart Rate Range (THRR);Able to understand and use rate of perceived exertion (RPE) scale    Comments Sanaii is scheduled to begin exercise on 1/28. Will monitor and progress as able. Andrea Berry has completed 4 exercise sessions. She exercises for 30 min on the Nustep. Andrea Berry averages 1.9 METs at level 2 on the Nustep. It is too soon to notate any discernable progressions. Andrea Berry will be difficult to progress due to her visual impairment, but she seems determined to improve her functional capacity. Will continue to monitor and progress as able. Andrea Berry has completed 11 exercise sessions. She exercises for 15 min on the Nustep and track. Andrea Berry averages 2.2 METs at level 3 on the Nustep and 2.04 METs on the track. She performs the warmup and cooldown standing with chair in front of her for balance. Andrea Berry has increased her level on the Nustep a few times. Her METs have increased. She has also transitioned to walking the back track. Andrea Berry is able to walk the back track without assistance even though she is visually impaired. Will continue to monitor and progress as able. Andrea Berry has completed 11 exercise sessions. She exercises for 15 min on the Nustep and track. Andrea Berry averages 2.4 METs at level 4 on the Nustep and 3.31 METs on the track. She performs the warmup and cooldown standing with chair in front of her for balance. Andrea Berry has increased her level on the Nustep as METs have increased. Her track laps have also increased. She tolerates progressions well. Recently, we have discussed home exercise. I encouraged Andrea Berry to get in any walking that she can. She  is very limited due to her impaired vision. Will continue to monitor and progress as able.    Expected Outcomes Through exercise and rehab and home,  the patient will decrease shortness of breath with daily activities and feel confident in carrying out an exercise regimen at home. Through exercise and rehab and home, the patient will decrease shortness of breath with daily activities and feel confident in carrying out an exercise regimen at home. Through exercise and rehab and home, the patient will decrease shortness of breath with daily activities and feel confident in carrying out an exercise regimen at home. Through exercise and rehab and home, the patient will decrease shortness of breath with daily activities and feel confident in carrying out an exercise regimen at home.             Discharge Exercise Prescription (Final Exercise Prescription Changes):  Exercise Prescription Changes - 12/24/23 1200       Response to Exercise   Blood Pressure (Admit) 128/80    Blood Pressure (Exercise) 160/70    Blood Pressure (Exit) 134/82    Heart Rate (Admit) 96 bpm    Heart Rate (Exercise) 113 bpm    Heart Rate (Exit) 105 bpm    Oxygen Saturation (Admit) 97 %    Oxygen Saturation (Exercise) 95 %    Oxygen Saturation (Exit) 97 %    Rating of Perceived Exertion (Exercise) 11    Perceived Dyspnea (Exercise) 0    Duration Continue with 30 min of aerobic exercise without signs/symptoms of physical distress.    Intensity THRR unchanged      Progression   Progression Continue to progress workloads to maintain intensity without signs/symptoms of physical distress.      Resistance Training   Training Prescription Yes    Weight red bands    Reps 10-15    Time 10 Minutes      NuStep   Level 4    Minutes 15    METs 2.4      Track   Laps 13    Minutes 15    METs 3             Nutrition:  Target Goals: Understanding of nutrition guidelines, daily intake of sodium 1500mg , cholesterol  200mg , calories 30% from fat and 7% or less from saturated fats, daily to have 5 or more servings of fruits and vegetables.  Biometrics:    Nutrition Therapy Plan and Nutrition Goals:  Nutrition Therapy & Goals - 12/20/23 1402       Nutrition Therapy   Diet Heart healthy/carbohydrate consistent diet    Drug/Food Interactions Statins/Certain Fruits      Personal Nutrition Goals   Nutrition Goal Patient to improve diet quality by using the plate method as a guide for meal planning to include lean protein/plant protein, fruits, vegetables, whole grains, nonfat dairy as part of a well-balanced diet.    Comments Andrea Berry has medical history of ILD, HTN, OSA, SLE, legal blindness, hx of TIA. She is aking pirfenidone for ILD; she has some nausea but no subsequent weight loss. She is up 6.2# since starting pulmonary rehab. Per most recent labs, A1c from November 2024 was in a diabetic range; she is not taking medication for this. Educated patient on reduction of simple sugars; she plans to reduce soda intake or switch to diet soda. Patient will continue to benefit from participation in pulmonary rehab for nutrition, exercise, and lifestyle modification.      Intervention Plan   Intervention Prescribe, educate and counsel regarding individualized specific dietary modifications aiming towards targeted core components such as weight, hypertension, lipid management, diabetes, heart failure and other comorbidities.;Nutrition  handout(s) given to patient.    Expected Outcomes Short Term Goal: Understand basic principles of dietary content, such as calories, fat, sodium, cholesterol and nutrients.;Long Term Goal: Adherence to prescribed nutrition plan.             Nutrition Assessments:  MEDIFICTS Score Key: >=70 Need to make dietary changes  40-70 Heart Healthy Diet <= 40 Therapeutic Level Cholesterol Diet   Picture Your Plate Scores: <78 Unhealthy dietary pattern with much room for  improvement. 41-50 Dietary pattern unlikely to meet recommendations for good health and room for improvement. 51-60 More healthful dietary pattern, with some room for improvement.  >60 Healthy dietary pattern, although there may be some specific behaviors that could be improved.    Nutrition Goals Re-Evaluation:  Nutrition Goals Re-Evaluation     Row Name 11/21/23 0907 12/20/23 1402           Goals   Current Weight 174 lb 9.7 oz (79.2 kg) 177 lb 0.5 oz (80.3 kg)      Comment A1c 6.7 hepatic panel WNL, A1c 6.7      Expected Outcome Andrea Berry has medical history of ILD, HTN, OSA, SLE, legal blindness, hx of TIA. She is aking pirfenidone for ILD; she has some nausea but no subsequent weight loss. She is up 3.7# since starting pulmonary rehab. Per most recent labs, A1c from November 2024 was in a diabetic range; she is not taking medication for this. Patient will continue to benefit from participation in pulmonary rehab for nutrition, exercise, and lifestyle modification. Andrea Berry has medical history of ILD, HTN, OSA, SLE, legal blindness, hx of TIA. She is aking pirfenidone for ILD; she has some nausea but no subsequent weight loss. She is up 6.2# since starting pulmonary rehab. Per most recent labs, A1c from November 2024 was in a diabetic range; she is not taking medication for this. Educated patient on reduction of simple sugars; she plans to reduce soda intake or switch to diet soda. Patient will continue to benefit from participation in pulmonary rehab for nutrition, exercise, and lifestyle modification.               Nutrition Goals Discharge (Final Nutrition Goals Re-Evaluation):  Nutrition Goals Re-Evaluation - 12/20/23 1402       Goals   Current Weight 177 lb 0.5 oz (80.3 kg)    Comment hepatic panel WNL, A1c 6.7    Expected Outcome Andrea Berry has medical history of ILD, HTN, OSA, SLE, legal blindness, hx of TIA. She is aking pirfenidone for ILD; she has some nausea but no  subsequent weight loss. She is up 6.2# since starting pulmonary rehab. Per most recent labs, A1c from November 2024 was in a diabetic range; she is not taking medication for this. Educated patient on reduction of simple sugars; she plans to reduce soda intake or switch to diet soda. Patient will continue to benefit from participation in pulmonary rehab for nutrition, exercise, and lifestyle modification.             Psychosocial: Target Goals: Acknowledge presence or absence of significant depression and/or stress, maximize coping skills, provide positive support system. Participant is able to verbalize types and ability to use techniques and skills needed for reducing stress and depression.  Initial Review & Psychosocial Screening:  Initial Psych Review & Screening - 10/02/23 0939       Initial Review   Current issues with Current Anxiety/Panic;Current Depression    Comments Pt is on medication for anxiety and depression.  Family Dynamics   Good Support System? Yes    Comments children      Barriers   Psychosocial barriers to participate in program There are no identifiable barriers or psychosocial needs.      Screening Interventions   Interventions Encouraged to exercise             Quality of Life Scores:  Scores of 19 and below usually indicate a poorer quality of life in these areas.  A difference of  2-3 points is a clinically meaningful difference.  A difference of 2-3 points in the total score of the Quality of Life Index has been associated with significant improvement in overall quality of life, self-image, physical symptoms, and general health in studies assessing change in quality of life.  PHQ-9: Review Flowsheet  More data exists      12/19/2023 12/05/2023 10/02/2023 09/05/2023 08/07/2023  Depression screen PHQ 2/9  Decreased Interest 0 0 1 1 1   Down, Depressed, Hopeless 0 0 0 0 1  PHQ - 2 Score 0 0 1 1 2   Altered sleeping 0 2 1 1 1   Tired, decreased  energy 0 0 1 1 1   Change in appetite 0 0 1 1 1   Feeling bad or failure about yourself  0 0 0 0 0  Trouble concentrating 0 0 0 3 1  Moving slowly or fidgety/restless 0 0 0 0 0  Suicidal thoughts 0 0 0 0 0  PHQ-9 Score 0 2 4 7 6   Difficult doing work/chores Not difficult at all - Not difficult at all Somewhat difficult Somewhat difficult   Interpretation of Total Score  Total Score Depression Severity:  1-4 = Minimal depression, 5-9 = Mild depression, 10-14 = Moderate depression, 15-19 = Moderately severe depression, 20-27 = Severe depression   Psychosocial Evaluation and Intervention:  Psychosocial Evaluation - 10/02/23 0940       Psychosocial Evaluation & Interventions   Interventions Stress management education;Relaxation education;Encouraged to exercise with the program and follow exercise prescription    Comments Andrea Berry denies any psychosocial barriers at this time.    Expected Outcomes For Andrea Berry to participate in rehab free of psychosocial barriers.    Continue Psychosocial Services  Follow up required by staff             Psychosocial Re-Evaluation:  Psychosocial Re-Evaluation     Row Name 10/25/23 0929 11/20/23 1149 12/18/23 0854         Psychosocial Re-Evaluation   Current issues with Current Depression;Current Anxiety/Panic Current Depression;Current Anxiety/Panic Current Depression;Current Anxiety/Panic     Comments Andrea Berry feels her depression is stable at this time. She states coming to class has helped her anxiety and depression. She is compliant with taking her psychotropic meds. No new barriers or concerns at this time. Andrea Berry denies any new psychosocial barriers or concerns at this time. She is compliant with taking her psychotropic meds. Andrea Berry continues to deny any new psychosocial barriers or concerns at this time. She feels her depression and anxiety are stable at this time. She continues to be compliant with taking her psychotropic meds.     Expected  Outcomes For Andrea Berry to participate in PR with less depression and anxiety For Andrea Berry to participate in PR with less depression and anxiety For Andrea Berry to continue to have less depression and anxiety while participating in PR     Interventions Encouraged to attend Pulmonary Rehabilitation for the exercise Encouraged to attend Pulmonary Rehabilitation for the exercise Encouraged to attend Pulmonary Rehabilitation  for the exercise     Continue Psychosocial Services  Follow up required by staff Follow up required by staff No Follow up required              Psychosocial Discharge (Final Psychosocial Re-Evaluation):  Psychosocial Re-Evaluation - 12/18/23 0854       Psychosocial Re-Evaluation   Current issues with Current Depression;Current Anxiety/Panic    Comments Andrea Berry continues to deny any new psychosocial barriers or concerns at this time. She feels her depression and anxiety are stable at this time. She continues to be compliant with taking her psychotropic meds.    Expected Outcomes For Andrea Berry to continue to have less depression and anxiety while participating in PR    Interventions Encouraged to attend Pulmonary Rehabilitation for the exercise    Continue Psychosocial Services  No Follow up required             Education: Education Goals: Education classes will be provided on a weekly basis, covering required topics. Participant will state understanding/return demonstration of topics presented.  Learning Barriers/Preferences:  Learning Barriers/Preferences - 10/02/23 0956       Learning Barriers/Preferences   Learning Barriers Sight    Learning Preferences Skilled Demonstration;Individual Instruction;Audio             Education Topics: Know Your Numbers Group instruction that is supported by a PowerPoint presentation. Instructor discusses importance of knowing and understanding resting, exercise, and post-exercise oxygen saturation, heart rate, and blood  pressure. Oxygen saturation, heart rate, blood pressure, rating of perceived exertion, and dyspnea are reviewed along with a normal range for these values.  Flowsheet Row PULMONARY REHAB OTHER RESPIRATORY from 12/12/2023 in [facility]  Date 12/12/23  Educator EP  Instruction Review Code 1- Verbalizes Understanding       Exercise for the Pulmonary Patient Group instruction that is supported by a PowerPoint presentation. Instructor discusses benefits of exercise, core components of exercise, frequency, duration, and intensity of an exercise routine, importance of utilizing pulse oximetry during exercise, safety while exercising, and options of places to exercise outside of rehab.    MET Level  Group instruction provided by PowerPoint, verbal discussion, and written material to support subject matter. Instructor reviews what METs are and how to increase METs.  Flowsheet Row PULMONARY REHAB OTHER RESPIRATORY from 10/31/2023 in [facility]  Date 10/31/23  Educator EP  Instruction Review Code 1- Verbalizes Understanding       Pulmonary Medications Verbally interactive group education provided by instructor with focus on inhaled medications and proper administration.   Anatomy and Physiology of the Respiratory System Group instruction provided by PowerPoint, verbal discussion, and written material to support subject matter. Instructor reviews respiratory cycle and anatomical components of the respiratory system and their functions. Instructor also reviews differences in obstructive and restrictive respiratory diseases with examples of each.    Oxygen Safety Group instruction provided by PowerPoint, verbal discussion, and written material to support subject matter. There is an overview of "What is Oxygen" and "Why do we need it".  Instructor also reviews how to create a safe  environment for oxygen use, the importance of using oxygen as prescribed, and the risks of noncompliance. There is a brief discussion on traveling with oxygen and resources the patient may utilize. Flowsheet Row PULMONARY REHAB OTHER RESPIRATORY from 12/19/2023 in [facility]  Date 12/19/23  Educator RN  Instruction Review Code 1- Verbalizes Understanding       Oxygen Use Group instruction provided by PowerPoint, verbal discussion, and written material to discuss how supplemental oxygen is prescribed and different types of oxygen supply systems. Resources for more information are provided.    Breathing Techniques Group instruction that is supported by demonstration and informational handouts. Instructor discusses the benefits of pursed lip and diaphragmatic breathing and detailed demonstration on how to perform both.     Risk Factor Reduction Group instruction that is supported by a PowerPoint presentation. Instructor discusses the definition of a risk factor, different risk factors for pulmonary disease, and how the heart and lungs work together. Flowsheet Row PULMONARY REHAB OTHER RESPIRATORY from 10/24/2023 in [facility]  Date 10/24/23  Educator EP  Instruction Review Code 1- Verbalizes Understanding       Pulmonary Diseases Group instruction provided by PowerPoint, verbal discussion, and written material to support subject matter. Instructor gives an overview of the different type of pulmonary diseases. There is also a discussion on risk factors and symptoms as well as ways to manage the diseases.   Stress and Energy Conservation Group instruction provided by PowerPoint, verbal discussion, and written material to support subject matter. Instructor gives an overview of stress and the impact it can have on the body. Instructor also reviews ways to reduce stress. There  is also a discussion on energy conservation and ways to conserve energy throughout the day. Flowsheet Row PULMONARY REHAB OTHER RESPIRATORY from 10/10/2023 in [facility]  Date 10/10/23  Educator RN  Instruction Review Code 1- Verbalizes Understanding       Warning Signs and Symptoms Group instruction provided by PowerPoint, verbal discussion, and written material to support subject matter. Instructor reviews warning signs and symptoms of stroke, heart attack, cold and flu. Instructor also reviews ways to prevent the spread of infection. Flowsheet Row PULMONARY REHAB OTHER RESPIRATORY from 10/17/2023 in [facility]  Date 10/17/23  Educator RN  Instruction Review Code 1- Verbalizes Understanding       Other Education Group or individual verbal, written, or video instructions that support the educational goals of the pulmonary rehab program. Flowsheet Row PULMONARY REHAB OTHER RESPIRATORY from 11/21/2023 in [facility]  Date 11/21/23  Educator EP  Instruction Review Code 1- Verbalizes Understanding        Knowledge Questionnaire Score:  Knowledge Questionnaire Score - 12/19/23 1157       Knowledge Questionnaire Score   Post Score 17/18             Core Components/Risk Factors/Patient Goals at Admission:  Personal Goals and Risk Factors at Admission - 10/02/23 0956       Core Components/Risk Factors/Patient Goals on Admission   Improve shortness of breath with ADL's Yes    Intervention Provide education, individualized exercise plan and daily activity instruction to help decrease symptoms of SOB with activities of daily living.    Expected Outcomes Short Term: Improve cardiorespiratory fitness to achieve a reduction of symptoms when performing ADLs;Long Term: Be able to perform more ADLs without  symptoms or delay the onset of symptoms             Core Components/Risk Factors/Patient Goals Review:   Goals and Risk Factor Review  Row Name 10/25/23 0932 11/20/23 1151 12/18/23 0857         Core Components/Risk Factors/Patient Goals Review   Personal Goals Review Improve shortness of breath with ADL's;Develop more efficient breathing techniques such as purse lipped breathing and diaphragmatic breathing and practicing self-pacing with activity. Improve shortness of breath with ADL's;Develop more efficient breathing techniques such as purse lipped breathing and diaphragmatic breathing and practicing self-pacing with activity. Improve shortness of breath with ADL's     Review Monthly review of goals are as follows.Aaron AasAaron AasGoal progressing for developing more efficient breathing techniques such as purse lipped breathing and diaphragmatic breathing; and practicing self-pacing with activity. Goal progressing for improving shortness of breath with ADL's. Andrea Berry is currently exercising on the Nustep for 30 minutes. Her sats have remained >88% on RA while exercising. We will continue monitoring Andrea Berry's progress throughout the program. Goal met for developing more efficient breathing techniques such as purse lipped breathing and diaphragmatic breathing; and practicing self-pacing with activity. Andrea Berry is able to demonstrate purse lip breathing when she gets SOB. She also knows how to pace herself based on her rate of perceived exertion score. Goal progressing for improving shortness of breath with ADL's. Andrea Berry is currently exercising on the Nustep and walking the track.  Her sats have remained >88% on RA while exercising. We will continue monitoring Andrea Berry's progress throughout the program. Monthly review of patient's Core Components/Risk Factors/Patient Goals are as follows: Goal progressing for improving shortness of breath with ADL's. Andrea Berry is currently exercising on the Nustep and walking  the track. Her sats have remained >88% on RA while exercising. We will continue monitoring Andrea Berry's progress throughout the program.     Expected Outcomes To improve shortness of breath with ADL's and develop more efficient breathing techniques such as purse lipped breathing and diaphragmatic breathing; and practicing self-pacing with activity. To improve shortness of breath with ADL's To improve shortness of breath with ADL's              Core Components/Risk Factors/Patient Goals at Discharge (Final Review):   Goals and Risk Factor Review - 12/18/23 0857       Core Components/Risk Factors/Patient Goals Review   Personal Goals Review Improve shortness of breath with ADL's    Review Monthly review of patient's Core Components/Risk Factors/Patient Goals are as follows: Goal progressing for improving shortness of breath with ADL's. Andrea Berry is currently exercising on the Nustep and walking the track. Her sats have remained >88% on RA while exercising. We will continue monitoring Andrea Berry's progress throughout the program.    Expected Outcomes To improve shortness of breath with ADL's             ITP Comments: Pt is making expected progress toward Pulmonary Rehab goals after completing 20 session(s). Recommend continued exercise, life style modification, education, and utilization of breathing techniques to increase stamina and strength, while also decreasing shortness of breath with exertion.  Dr. Genetta Kenning is Medical Director for Pulmonary Rehab at [facility].

## 2023-12-26 ENCOUNTER — Encounter (HOSPITAL_COMMUNITY)
Admission: RE | Admit: 2023-12-26 | Discharge: 2023-12-26 | Disposition: A | Discharge status: Home / Self Care | Payer: Medicare Other | Source: Ambulatory Visit | Attending: Pulmonary Disease | Admitting: Pulmonary Disease

## 2023-12-26 DIAGNOSIS — J849 Interstitial pulmonary disease, unspecified: Secondary | ICD-10-CM

## 2023-12-26 NOTE — Progress Notes (Signed)
 Discharge Progress Report  Patient Details  Name: Andrea Berry MRN: [id number] Date of Birth: Dec 09, 1965 Referring Provider:   [name] Kass Pulmonary Rehab Walk Test from 10/02/2023 in [facility]  Referring Provider Namen  [[name]]        Number of Visits: 21  Reason for Discharge:  Patient has met program and personal goals.  Smoking History:  Social History   Tobacco Use  Smoking Status Never  Smokeless Tobacco Never    Diagnosis:  Interstitial lung disease (HCC)  ADL [facility]:  Pulmonary Assessment Scores     Row Name 10/02/23 0958 12/19/23 1158       ADL [facility]   ADL Phase Entry Exit    SOB Score total 68 51      CAT Score   CAT Score 24 21      mMRC Score   mMRC Score 4 --             Initial Exercise Prescription:  Initial Exercise Prescription - 10/02/23 1100       Date of Initial Exercise RX and Referring Provider   Date 10/02/23    Referring Provider Jari Merles   Expected Discharge Date 12/26/23      NuStep   Level 1    SPM 50    Minutes 20    METs 1.5      Prescription Details   Frequency (times per week) 2    Duration Progress to 30 minutes of continuous aerobic without signs/symptoms of physical distress      Intensity   THRR 40-80% of Max Heartrate 66-132    Ratings of Perceived Exertion 11-13    Perceived Dyspnea 0-4      Progression   Progression Continue to progress workloads to maintain intensity without signs/symptoms of physical distress.      Resistance Training   Training Prescription Yes    Weight red bands    Reps 10-15             Discharge Exercise Prescription (Final Exercise Prescription Changes):  Exercise Prescription Changes - 12/24/23 1200       Response to Exercise   Blood Pressure (Admit) 128/80    Blood Pressure (Exercise) 160/70    Blood Pressure (Exit) 134/82    Heart Rate (Admit) 96 bpm    Heart Rate (Exercise) 113 bpm     Heart Rate (Exit) 105 bpm    Oxygen Saturation (Admit) 97 %    Oxygen Saturation (Exercise) 95 %    Oxygen Saturation (Exit) 97 %    Rating of Perceived Exertion (Exercise) 11    Perceived Dyspnea (Exercise) 0    Duration Continue with 30 min of aerobic exercise without signs/symptoms of physical distress.    Intensity THRR unchanged      Progression   Progression Continue to progress workloads to maintain intensity without signs/symptoms of physical distress.      Resistance Training   Training Prescription Yes    Weight red bands    Reps 10-15    Time 10 Minutes      NuStep   Level 4    Minutes 15    METs 2.4      Track   Laps 13    Minutes 15    METs 3             Functional Capacity:  6 Minute Walk     Row Name  10/02/23 1102 12/26/23 1503       6 Minute Walk   Phase Initial Discharge    Distance 485 feet 790 feet    Distance % Change -- 62.89 %    Distance Feet Change -- 305 ft    Walk Time 6 minutes 6 minutes    # of Rest Breaks 1  5:30-5:50 0    MPH 0.92 0.6    METS 2.05 2.9    RPE 12 11    Perceived Dyspnea  0 0    VO2 Peak 7.16 10.16    Symptoms No No    Resting HR 107 bpm 109 bpm    Resting BP 122/74 124/80    Resting Oxygen Saturation  100 % 98 %    Exercise Oxygen Saturation  during 6 min walk 94 % 98 %    Max Ex. HR 122 bpm 132 bpm    Max Ex. BP 118/74 150/88    2 Minute Post BP 116/70 148/80      Interval HR   1 Minute HR 115 121    2 Minute HR 111 122    3 Minute HR 109 124    4 Minute HR 111 125    5 Minute HR 122 132    6 Minute HR 114 123    2 Minute Post HR 99 105    Interval Heart Rate? Yes Yes      Interval Oxygen   Interval Oxygen? Yes Yes    Baseline Oxygen Saturation % 100 % 98 %    1 Minute Oxygen Saturation % 100 % 95 %    1 Minute Liters of Oxygen 0 L 0 L    2 Minute Oxygen Saturation % 98 % 95 %    2 Minute Liters of Oxygen 0 L 0 L    3 Minute Oxygen Saturation % 99 % 96 %    3 Minute Liters of Oxygen 0 L 0 L     4 Minute Oxygen Saturation % 99 % 95 %    4 Minute Liters of Oxygen 0 L 0 L    5 Minute Oxygen Saturation % 94 % 93 %    5 Minute Liters of Oxygen 0 L 0 L    6 Minute Oxygen Saturation % 98 % 94 %    6 Minute Liters of Oxygen 0 L 0 L    2 Minute Post Oxygen Saturation % 100 % 99 %    2 Minute Post Liters of Oxygen 0 L 0 L             Psychological, QOL, Others - Outcomes: PHQ 2/9:    12/19/2023   11:57 AM 12/05/2023    9:07 AM 10/02/2023   10:50 AM 09/05/2023    4:02 PM 08/07/2023    9:12 AM  Depression screen PHQ 2/9  Decreased Interest 0 0 1 1 1   Down, Depressed, Hopeless 0 0 0 0 1  PHQ - 2 Score 0 0 1 1 2   Altered sleeping 0 2 1 1 1   Tired, decreased energy 0 0 1 1 1   Change in appetite 0 0 1 1 1   Feeling bad or failure about yourself  0 0 0 0 0  Trouble concentrating 0 0 0 3 1  Moving slowly or fidgety/restless 0 0 0 0 0  Suicidal thoughts 0 0 0 0 0  PHQ-9 Score 0 2 4 7 6   Difficult doing work/chores Not difficult at  all  Not difficult at all Somewhat difficult Somewhat difficult    Quality of Life:   Personal Goals: Goals established at orientation with interventions provided to work toward goal.  Personal Goals and Risk Factors at Admission - 10/02/23 0956       Core Components/Risk Factors/Patient Goals on Admission   Improve shortness of breath with ADL's Yes    Intervention Provide education, individualized exercise plan and daily activity instruction to help decrease symptoms of SOB with activities of daily living.    Expected Outcomes Short Term: Improve cardiorespiratory fitness to achieve a reduction of symptoms when performing ADLs;Long Term: Be able to perform more ADLs without symptoms or delay the onset of symptoms              Personal Goals Discharge:  Goals and Risk Factor Review     Row Name 10/25/23 0932 11/20/23 1151 12/18/23 0857 12/26/23 1518       Core Components/Risk Factors/Patient Goals Review   Personal Goals Review  Improve shortness of breath with ADL's;Develop more efficient breathing techniques such as purse lipped breathing and diaphragmatic breathing and practicing self-pacing with activity. Improve shortness of breath with ADL's;Develop more efficient breathing techniques such as purse lipped breathing and diaphragmatic breathing and practicing self-pacing with activity. Improve shortness of breath with ADL's Improve shortness of breath with ADL's    Review Monthly review of goals are as follows.Andrea AasAaron AasGoal progressing for developing more efficient breathing techniques such as purse lipped breathing and diaphragmatic breathing; and practicing self-pacing with activity. Goal progressing for improving shortness of breath with ADL's. Andrea Berry is currently exercising on the Nustep for 30 minutes. Her sats have remained >88% on RA while exercising. We will continue monitoring Andrea Berry's progress throughout the program. Goal met for developing more efficient breathing techniques such as purse lipped breathing and diaphragmatic breathing; and practicing self-pacing with activity. Andrea Berry is able to demonstrate purse lip breathing when she gets SOB. She also knows how to pace herself based on her rate of perceived exertion score. Goal progressing for improving shortness of breath with ADL's. Andrea Berry is currently exercising on the Nustep and walking the track.  Her sats have remained >88% on RA while exercising. We will continue monitoring Andrea Berry's progress throughout the program. Monthly review of patient's Core Components/Risk Factors/Patient Goals are as follows: Goal progressing for improving shortness of breath with ADL's. Andrea Berry is currently exercising on the Nustep and walking the track. Her sats have remained >88% on RA while exercising. We will continue monitoring Andrea Berry's progress throughout the program. Andrea Berry graduated from the [facility] program on 12/26/23. She met her goal for improving shortness of breath with ADL's.  Initial SOB score 68/ post 51. Andrea Berry did great in the program! We wish her the best.    Expected Outcomes To improve shortness of breath with ADL's and develop more efficient breathing techniques such as purse lipped breathing and diaphragmatic breathing; and practicing self-pacing with activity. To improve shortness of breath with ADL's To improve shortness of breath with ADL's Pt will continue to apply the knowledge she learned in [facility]             Exercise Goals and Review:  Exercise Goals     Row Name 10/02/23 0936             Exercise Goals   Increase Physical Activity Yes       Intervention Provide advice, education, support and counseling about physical activity/exercise needs.;Develop an individualized exercise prescription for aerobic and  resistive training based on initial evaluation findings, risk stratification, comorbidities and participant's personal goals.       Expected Outcomes Short Term: Attend rehab on a regular basis to increase amount of physical activity.;Long Term: Exercising regularly at least 3-5 days a week.;Long Term: Add in home exercise to make exercise part of routine and to increase amount of physical activity.       Increase Strength and Stamina Yes       Intervention Provide advice, education, support and counseling about physical activity/exercise needs.;Develop an individualized exercise prescription for aerobic and resistive training based on initial evaluation findings, risk stratification, comorbidities and participant's personal goals.       Expected Outcomes Short Term: Increase workloads from initial exercise prescription for resistance, speed, and METs.;Short Term: Perform resistance training exercises routinely during rehab and add in resistance training at home;Long Term: Improve cardiorespiratory fitness, muscular endurance and strength as measured by increased METs and functional capacity ( )       Able to understand and use rate of perceived  exertion (RPE) scale Yes       Intervention Provide education and explanation on how to use RPE scale       Expected Outcomes Short Term: Able to use RPE daily in rehab to express subjective intensity level;Long Term:  Able to use RPE to guide intensity level when exercising independently       Able to understand and use Dyspnea scale Yes       Intervention Provide education and explanation on how to use Dyspnea scale       Expected Outcomes Short Term: Able to use Dyspnea scale daily in rehab to express subjective sense of shortness of breath during exertion;Long Term: Able to use Dyspnea scale to guide intensity level when exercising independently       Knowledge and understanding of Target Heart Rate Range (THRR) Yes       Intervention Provide education and explanation of THRR including how the numbers were predicted and where they are located for reference       Expected Outcomes Short Term: Able to state/look up THRR;Long Term: Able to use THRR to govern intensity when exercising independently;Short Term: Able to use daily as guideline for intensity in rehab       Understanding of Exercise Prescription Yes       Intervention Provide education, explanation, and written materials on patient's individual exercise prescription       Expected Outcomes Short Term: Able to explain program exercise prescription;Long Term: Able to explain home exercise prescription to exercise independently                Exercise Goals Re-Evaluation:  Exercise Goals Re-Evaluation     Row Name 10/02/23 6295 10/21/23 1211 11/20/23 1200 12/18/23 1102 12/26/23 1517     Exercise Goal Re-Evaluation   Exercise Goals Review Increase Physical Activity;Able to understand and use Dyspnea scale;Understanding of Exercise Prescription;Increase Strength and Stamina;Knowledge and understanding of Target Heart Rate Range (THRR);Able to understand and use rate of perceived exertion (RPE) scale Increase Physical Activity;Able to  understand and use Dyspnea scale;Understanding of Exercise Prescription;Increase Strength and Stamina;Knowledge and understanding of Target Heart Rate Range (THRR);Able to understand and use rate of perceived exertion (RPE) scale Increase Physical Activity;Able to understand and use Dyspnea scale;Understanding of Exercise Prescription;Increase Strength and Stamina;Knowledge and understanding of Target Heart Rate Range (THRR);Able to understand and use rate of perceived exertion (RPE) scale Increase Physical Activity;Able to understand and use Dyspnea  scale;Understanding of Exercise Prescription;Increase Strength and Stamina;Knowledge and understanding of Target Heart Rate Range (THRR);Able to understand and use rate of perceived exertion (RPE) scale Increase Physical Activity;Able to understand and use Dyspnea scale;Understanding of Exercise Prescription;Increase Strength and Stamina;Knowledge and understanding of Target Heart Rate Range (THRR);Able to understand and use rate of perceived exertion (RPE) scale   Comments Andrea Berry is scheduled to begin exercise on 1/28. Will monitor and progress as able. Andrea Berry has completed 4 exercise sessions. She exercises for 30 min on the Nustep. Andrea Berry averages 1.9 METs at level 2 on the Nustep. It is too soon to notate any discernable progressions. Andrea Berry will be difficult to progress due to her visual impairment, but she seems determined to improve her functional capacity. Will continue to monitor and progress as able. Andrea Berry has completed 11 exercise sessions. She exercises for 15 min on the Nustep and track. Andrea Berry averages 2.2 METs at level 3 on the Nustep and 2.04 METs on the track. She performs the warmup and cooldown standing with chair in front of her for balance. Andrea Berry has increased her level on the Nustep a few times. Her METs have increased. She has also transitioned to walking the back track. Andrea Berry is able to walk the back track without assistance even  though she is visually impaired. Will continue to monitor and progress as able. Andrea Berry has completed 11 exercise sessions. She exercises for 15 min on the Nustep and track. Andrea Berry averages 2.4 METs at level 4 on the Nustep and 3.31 METs on the track. She performs the warmup and cooldown standing with chair in front of her for balance. Andrea Berry has increased her level on the Nustep as METs have increased. Her track laps have also increased. She tolerates progressions well. Recently, we have discussed home exercise. I encouraged Andrea Berry to get in any walking that she can. She is very limited due to her impaired vision. Will continue to monitor and progress as able. Andrea Berry has completed 21 exericse sessions. Peak METs were 2.1 on the Nustep and 3.31 on the track.   Expected Outcomes Through exercise and rehab and home, the patient will decrease shortness of breath with daily activities and feel confident in carrying out an exercise regimen at home. Through exercise and rehab and home, the patient will decrease shortness of breath with daily activities and feel confident in carrying out an exercise regimen at home. Through exercise and rehab and home, the patient will decrease shortness of breath with daily activities and feel confident in carrying out an exercise regimen at home. Through exercise and rehab and home, the patient will decrease shortness of breath with daily activities and feel confident in carrying out an exercise regimen at home. Through exercise and rehab and home, the patient will decrease shortness of breath with daily activities and feel confident in carrying out an exercise regimen at home.            Nutrition & Weight - Outcomes:    Nutrition:  Nutrition Therapy & Goals - 12/20/23 1402       Nutrition Therapy   Diet Heart healthy/carbohydrate consistent diet    Drug/Food Interactions Statins/Certain Fruits      Personal Nutrition Goals   Nutrition Goal Patient to  improve diet quality by using the plate method as a guide for meal planning to include lean protein/plant protein, fruits, vegetables, whole grains, nonfat dairy as part of a well-balanced diet.    Comments Andrea Berry has medical history of ILD, HTN, OSA,  SLE, legal blindness, hx of TIA. She is aking pirfenidone for ILD; she has some nausea but no subsequent weight loss. She is up 6.2# since starting pulmonary rehab. Per most recent labs, A1c from November 2024 was in a diabetic range; she is not taking medication for this. Educated patient on reduction of simple sugars; she plans to reduce soda intake or switch to diet soda. Patient will continue to benefit from participation in pulmonary rehab for nutrition, exercise, and lifestyle modification.      Intervention Plan   Intervention Prescribe, educate and counsel regarding individualized specific dietary modifications aiming towards targeted core components such as weight, hypertension, lipid management, diabetes, heart failure and other comorbidities.;Nutrition handout(s) given to patient.    Expected Outcomes Short Term Goal: Understand basic principles of dietary content, such as calories, fat, sodium, cholesterol and nutrients.;Long Term Goal: Adherence to prescribed nutrition plan.             Nutrition Discharge:   Education Questionnaire Score:  Knowledge Questionnaire Score - 12/19/23 1157       Knowledge Questionnaire Score   Post Score 17/18             Goals reviewed with patient; copy given to patient.

## 2023-12-26 NOTE — Progress Notes (Signed)
 Daily Session Note  Patient Details  Name: Andrea Berry MRN: [id number] Date of Birth: February 29, 1966 Referring Provider:   [name] Kass Pulmonary Rehab Walk Test from 10/02/2023 in [facility]  Referring Provider Namen  [[name]]       Encounter Date: 12/26/2023  Check In:  Session Check In - 12/26/23 1024       Check-In   Supervising physician immediately available to respond to emergencies [facility] MD immediately available    Physician(s) Charles Connor, NP    Location [facility]    Staff Present Atlas Lea, MS, ACSM-CEP, Exercise Physiologist;Casey Mayme Spearman BS, ACSM-CEP, Exercise Physiologist;Mary Arlester Ladd, RN, Emerick Hanlon, MS, Exercise Physiologist;Johnny Alexia Angelucci, MS, Exercise Physiologist    Virtual Visit No    Medication changes reported     No    Fall or balance concerns reported    No    Tobacco Cessation No Change    Warm-up and Cool-down Performed as group-led instruction    Resistance Training Performed Yes    VAD Patient? No    PAD/SET Patient? No      Pain Assessment   Currently in Pain? No/denies    Multiple Pain Sites No             Capillary Blood Glucose: No results found for this or any previous visit (from the past 24 hours).    Social History   Tobacco Use  Smoking Status Never  Smokeless Tobacco Never    Goals Met:  Exercise tolerated well No report of concerns or symptoms today Strength training completed today  Goals Unmet:  Not Applicable  Comments: Pt completed program today and graduated. Service time is from 1008 to 1135.    Dr. Genetta Kenning is Medical Director for Pulmonary Rehab at [facility].

## 2024-01-03 ENCOUNTER — Other Ambulatory Visit: Payer: Self-pay

## 2024-01-06 ENCOUNTER — Other Ambulatory Visit

## 2024-01-06 DIAGNOSIS — Z79899 Other long term (current) drug therapy: Secondary | ICD-10-CM

## 2024-01-07 ENCOUNTER — Encounter: Payer: Self-pay | Admitting: Family Medicine

## 2024-01-07 LAB — HEPATIC FUNCTION PANEL
ALT: 8 IU/L (ref 0–32)
AST: 16 IU/L (ref 0–40)
Albumin: 4.1 g/dL (ref 3.8–4.9)
Alkaline Phosphatase: 84 IU/L (ref 44–121)
Bilirubin Total: 0.2 mg/dL (ref 0.0–1.2)
Bilirubin, Direct: <0.08 mg/dL (ref 0.00–0.40)
Total Protein: 6.4 g/dL (ref 6.0–8.5)

## 2024-01-09 ENCOUNTER — Other Ambulatory Visit: Payer: Self-pay

## 2024-01-13 ENCOUNTER — Other Ambulatory Visit: Payer: Self-pay

## 2024-01-15 ENCOUNTER — Other Ambulatory Visit: Payer: Self-pay

## 2024-01-16 ENCOUNTER — Other Ambulatory Visit: Payer: Self-pay

## 2024-01-20 ENCOUNTER — Other Ambulatory Visit (HOSPITAL_COMMUNITY): Payer: Self-pay

## 2024-01-20 ENCOUNTER — Other Ambulatory Visit: Payer: Self-pay

## 2024-01-20 MED ORDER — LORAZEPAM 0.5 MG PO TABS
0.5000 mg | ORAL_TABLET | Freq: Two times a day (BID) | ORAL | 2 refills | Status: DC
  Filled 2024-01-20 – 2024-03-04 (×2): qty 40, 20d supply, fill #0
  Filled 2024-05-25: qty 40, 20d supply, fill #1

## 2024-01-20 MED ORDER — PREDNISONE 10 MG PO TABS
5.0000 mg | ORAL_TABLET | Freq: Every day | ORAL | 3 refills | Status: DC
  Filled 2024-01-20: qty 15, 30d supply, fill #0

## 2024-01-20 MED ORDER — BACLOFEN 10 MG PO TABS
10.0000 mg | ORAL_TABLET | Freq: Two times a day (BID) | ORAL | 1 refills | Status: DC
  Filled 2024-01-20 (×2): qty 60, 30d supply, fill #0

## 2024-01-20 MED ORDER — OXCARBAZEPINE 150 MG PO TABS
300.0000 mg | ORAL_TABLET | Freq: Two times a day (BID) | ORAL | 3 refills | Status: DC
  Filled 2024-01-20 (×2): qty 360, 90d supply, fill #0

## 2024-01-20 MED ORDER — DULOXETINE HCL 60 MG PO CPEP
60.0000 mg | ORAL_CAPSULE | Freq: Every day | ORAL | 0 refills | Status: DC
  Filled 2024-01-20: qty 90, 90d supply, fill #0
  Filled 2024-01-20: qty 232, 232d supply, fill #0

## 2024-01-20 MED ORDER — ONDANSETRON 4 MG PO TBDP
4.0000 mg | ORAL_TABLET | Freq: Three times a day (TID) | ORAL | 3 refills | Status: DC | PRN
  Filled 2024-01-20 – 2024-03-04 (×2): qty 30, 10d supply, fill #0

## 2024-01-20 MED ORDER — RIVAROXABAN 10 MG PO TABS
10.0000 mg | ORAL_TABLET | Freq: Every day | ORAL | 8 refills | Status: DC
  Filled 2024-01-20 – 2024-01-30 (×3): qty 30, 30d supply, fill #0
  Filled 2024-01-30: qty 21, 21d supply, fill #0
  Filled 2024-03-04: qty 21, 21d supply, fill #1
  Filled 2024-03-09 – 2024-03-11 (×2): qty 21, 21d supply, fill #2
  Filled 2024-03-23: qty 30, 30d supply, fill #2
  Filled 2024-04-06 – 2024-04-16 (×2): qty 30, 30d supply, fill #3
  Filled 2024-05-13 (×2): qty 30, 30d supply, fill #4
  Filled 2024-06-10 – 2024-06-16 (×3): qty 30, 30d supply, fill #5
  Filled 2024-07-20 (×2): qty 30, 30d supply, fill #6
  Filled 2024-08-12: qty 30, 30d supply, fill #7
  Filled 2024-09-14: qty 18, 18d supply, fill #8

## 2024-01-21 ENCOUNTER — Encounter (HOSPITAL_COMMUNITY): Payer: Self-pay | Admitting: Emergency Medicine

## 2024-01-21 ENCOUNTER — Emergency Department (HOSPITAL_COMMUNITY)

## 2024-01-21 ENCOUNTER — Observation Stay (HOSPITAL_COMMUNITY)
Admission: EM | Admit: 2024-01-21 | Discharge: 2024-01-23 | Disposition: A | Discharge status: Home / Self Care | Attending: Family Medicine | Admitting: Family Medicine

## 2024-01-21 ENCOUNTER — Other Ambulatory Visit: Payer: Self-pay

## 2024-01-21 DIAGNOSIS — I10 Essential (primary) hypertension: Secondary | ICD-10-CM | POA: Insufficient documentation

## 2024-01-21 DIAGNOSIS — M329 Systemic lupus erythematosus, unspecified: Secondary | ICD-10-CM | POA: Insufficient documentation

## 2024-01-21 DIAGNOSIS — Z7901 Long term (current) use of anticoagulants: Secondary | ICD-10-CM | POA: Diagnosis not present

## 2024-01-21 DIAGNOSIS — G8929 Other chronic pain: Secondary | ICD-10-CM | POA: Diagnosis not present

## 2024-01-21 DIAGNOSIS — G360 Neuromyelitis optica [Devic]: Secondary | ICD-10-CM | POA: Diagnosis not present

## 2024-01-21 DIAGNOSIS — Z79899 Other long term (current) drug therapy: Secondary | ICD-10-CM | POA: Insufficient documentation

## 2024-01-21 DIAGNOSIS — H548 Legal blindness, as defined in USA: Secondary | ICD-10-CM | POA: Diagnosis not present

## 2024-01-21 DIAGNOSIS — Z86711 Personal history of pulmonary embolism: Secondary | ICD-10-CM | POA: Diagnosis not present

## 2024-01-21 DIAGNOSIS — Z8673 Personal history of transient ischemic attack (TIA), and cerebral infarction without residual deficits: Secondary | ICD-10-CM | POA: Insufficient documentation

## 2024-01-21 DIAGNOSIS — K219 Gastro-esophageal reflux disease without esophagitis: Secondary | ICD-10-CM | POA: Insufficient documentation

## 2024-01-21 DIAGNOSIS — F419 Anxiety disorder, unspecified: Secondary | ICD-10-CM | POA: Diagnosis not present

## 2024-01-21 DIAGNOSIS — R55 Syncope and collapse: Principal | ICD-10-CM | POA: Insufficient documentation

## 2024-01-21 DIAGNOSIS — I429 Cardiomyopathy, unspecified: Secondary | ICD-10-CM

## 2024-01-21 DIAGNOSIS — Z789 Other specified health status: Secondary | ICD-10-CM

## 2024-01-21 DIAGNOSIS — E785 Hyperlipidemia, unspecified: Secondary | ICD-10-CM | POA: Insufficient documentation

## 2024-01-21 DIAGNOSIS — D509 Iron deficiency anemia, unspecified: Secondary | ICD-10-CM | POA: Diagnosis not present

## 2024-01-21 DIAGNOSIS — I69354 Hemiplegia and hemiparesis following cerebral infarction affecting left non-dominant side: Secondary | ICD-10-CM | POA: Insufficient documentation

## 2024-01-21 DIAGNOSIS — D649 Anemia, unspecified: Secondary | ICD-10-CM | POA: Diagnosis present

## 2024-01-21 LAB — CBC WITH DIFFERENTIAL/PLATELET
Abs Immature Granulocytes: 0.05 10*3/uL (ref 0.00–0.07)
Basophils Absolute: 0 10*3/uL (ref 0.0–0.1)
Basophils Relative: 0 %
Eosinophils Absolute: 0.1 10*3/uL (ref 0.0–0.5)
Eosinophils Relative: 1 %
HCT: 38.3 % (ref 36.0–46.0)
Hemoglobin: 11.4 g/dL — ABNORMAL LOW (ref 12.0–15.0)
Immature Granulocytes: 1 %
Lymphocytes Relative: 19 %
Lymphs Abs: 1.7 10*3/uL (ref 0.7–4.0)
MCH: 21.6 pg — ABNORMAL LOW (ref 26.0–34.0)
MCHC: 29.8 g/dL — ABNORMAL LOW (ref 30.0–36.0)
MCV: 72.5 fL — ABNORMAL LOW (ref 80.0–100.0)
Monocytes Absolute: 0.5 10*3/uL (ref 0.1–1.0)
Monocytes Relative: 5 %
Neutro Abs: 7 10*3/uL (ref 1.7–7.7)
Neutrophils Relative %: 74 %
Platelets: 276 10*3/uL (ref 150–400)
RBC: 5.28 MIL/uL — ABNORMAL HIGH (ref 3.87–5.11)
RDW: 16.7 % — ABNORMAL HIGH (ref 11.5–15.5)
WBC: 9.3 10*3/uL (ref 4.0–10.5)
nRBC: 0 % (ref 0.0–0.2)

## 2024-01-21 LAB — TROPONIN I (HIGH SENSITIVITY): Troponin I (High Sensitivity): 10 ng/L (ref <18)

## 2024-01-21 MED ORDER — LACTATED RINGERS IV BOLUS
500.0000 mL | Freq: Once | INTRAVENOUS | Status: AC
  Administered 2024-01-21: 500 mL via INTRAVENOUS

## 2024-01-21 NOTE — ED Notes (Signed)
 Lab called with a hemolyzed CMP. This RN will recollect and send 2 tubes down.

## 2024-01-21 NOTE — ED Triage Notes (Signed)
 Pt BIB EMS from home with c/o syncopal episode while sitting getting her hair done. Pt became diaphoretic after standing to walk out of the bathroom. Hx of stroke, with left sided deficits.   160/80 in reclined position [id number]

## 2024-01-22 ENCOUNTER — Inpatient Hospital Stay (HOSPITAL_BASED_OUTPATIENT_CLINIC_OR_DEPARTMENT_OTHER)

## 2024-01-22 ENCOUNTER — Other Ambulatory Visit (HOSPITAL_COMMUNITY): Payer: Self-pay

## 2024-01-22 DIAGNOSIS — R55 Syncope and collapse: Secondary | ICD-10-CM

## 2024-01-22 DIAGNOSIS — Z789 Other specified health status: Secondary | ICD-10-CM

## 2024-01-22 LAB — CBC
HCT: 36.4 % (ref 36.0–46.0)
Hemoglobin: 10.6 g/dL — ABNORMAL LOW (ref 12.0–15.0)
MCH: 21.2 pg — ABNORMAL LOW (ref 26.0–34.0)
MCHC: 29.1 g/dL — ABNORMAL LOW (ref 30.0–36.0)
MCV: 72.8 fL — ABNORMAL LOW (ref 80.0–100.0)
Platelets: 337 10*3/uL (ref 150–400)
RBC: 5 MIL/uL (ref 3.87–5.11)
RDW: 16.4 % — ABNORMAL HIGH (ref 11.5–15.5)
WBC: 6.7 10*3/uL (ref 4.0–10.5)
nRBC: 0 % (ref 0.0–0.2)

## 2024-01-22 LAB — COMPREHENSIVE METABOLIC PANEL WITH GFR
ALT: 13 U/L (ref 0–44)
AST: 19 U/L (ref 15–41)
Albumin: 3.4 g/dL — ABNORMAL LOW (ref 3.5–5.0)
Alkaline Phosphatase: 68 U/L (ref 38–126)
Anion gap: 10 (ref 5–15)
BUN: 8 mg/dL (ref 6–20)
CO2: 23 mmol/L (ref 22–32)
Calcium: 9.6 mg/dL (ref 8.9–10.3)
Chloride: 107 mmol/L (ref 98–111)
Creatinine, Ser: 1.03 mg/dL — ABNORMAL HIGH (ref 0.44–1.00)
GFR, Estimated: >60 mL/min (ref >60)
Glucose, Bld: 137 mg/dL — ABNORMAL HIGH (ref 70–99)
Potassium: 4.1 mmol/L (ref 3.5–5.1)
Sodium: 140 mmol/L (ref 135–145)
Total Bilirubin: 0.2 mg/dL (ref 0.0–1.2)
Total Protein: 6.3 g/dL — ABNORMAL LOW (ref 6.5–8.1)

## 2024-01-22 LAB — ECHOCARDIOGRAM COMPLETE
AR max vel: 1.83 cm2
AV Area VTI: 1.92 cm2
AV Area mean vel: 1.77 cm2
AV Mean grad: 6.5 mmHg
AV Peak grad: 12.8 mmHg
Ao pk vel: 1.79 m/s
Area-P 1/2: 19.45 cm2
Height: 61 in
S' Lateral: 2.1 cm
Weight: 2850.11 [oz_av]

## 2024-01-22 LAB — BASIC METABOLIC PANEL WITH GFR
Anion gap: 11 (ref 5–15)
BUN: 8 mg/dL (ref 6–20)
CO2: 23 mmol/L (ref 22–32)
Calcium: 9.4 mg/dL (ref 8.9–10.3)
Chloride: 106 mmol/L (ref 98–111)
Creatinine, Ser: 0.86 mg/dL (ref 0.44–1.00)
GFR, Estimated: >60 mL/min (ref >60)
Glucose, Bld: 88 mg/dL (ref 70–99)
Potassium: 4.2 mmol/L (ref 3.5–5.1)
Sodium: 140 mmol/L (ref 135–145)

## 2024-01-22 LAB — TSH: TSH: 2.003 u[IU]/mL (ref 0.350–4.500)

## 2024-01-22 LAB — TROPONIN I (HIGH SENSITIVITY): Troponin I (High Sensitivity): 11 ng/L (ref <18)

## 2024-01-22 LAB — HIV ANTIBODY (ROUTINE TESTING W REFLEX): HIV Screen 4th Generation wRfx: NONREACTIVE

## 2024-01-22 LAB — MAGNESIUM: Magnesium: 1.9 mg/dL (ref 1.7–2.4)

## 2024-01-22 MED ORDER — AZITHROMYCIN 500 MG PO TABS
250.0000 mg | ORAL_TABLET | Freq: Every day | ORAL | Status: DC
  Administered 2024-01-22 – 2024-01-23 (×2): 250 mg via ORAL
  Filled 2024-01-22 (×2): qty 1

## 2024-01-22 MED ORDER — PREDNISONE 5 MG PO TABS
5.0000 mg | ORAL_TABLET | Freq: Every day | ORAL | Status: DC
  Administered 2024-01-22 – 2024-01-23 (×2): 5 mg via ORAL
  Filled 2024-01-22 (×2): qty 1

## 2024-01-22 MED ORDER — PIRFENIDONE 267 MG PO TABS
801.0000 mg | ORAL_TABLET | Freq: Three times a day (TID) | ORAL | Status: DC
  Administered 2024-01-22 – 2024-01-23 (×3): 801 mg via ORAL
  Filled 2024-01-22 (×5): qty 3

## 2024-01-22 MED ORDER — ONDANSETRON 4 MG PO TBDP
4.0000 mg | ORAL_TABLET | Freq: Three times a day (TID) | ORAL | Status: DC | PRN
  Administered 2024-01-22: 4 mg via ORAL
  Filled 2024-01-22: qty 1

## 2024-01-22 MED ORDER — PANTOPRAZOLE SODIUM 40 MG PO TBEC
40.0000 mg | DELAYED_RELEASE_TABLET | Freq: Every day | ORAL | Status: DC
  Administered 2024-01-22 – 2024-01-23 (×2): 40 mg via ORAL
  Filled 2024-01-22 (×2): qty 1

## 2024-01-22 MED ORDER — ACETAMINOPHEN 650 MG RE SUPP: 650.0000 mg | Freq: Four times a day (QID) | RECTAL | Status: DC | PRN

## 2024-01-22 MED ORDER — ATORVASTATIN CALCIUM 40 MG PO TABS
40.0000 mg | ORAL_TABLET | Freq: Every day | ORAL | Status: DC
  Administered 2024-01-22 – 2024-01-23 (×2): 40 mg via ORAL
  Filled 2024-01-22 (×2): qty 1

## 2024-01-22 MED ORDER — ALBUTEROL SULFATE (2.5 MG/3ML) 0.083% IN NEBU: 2.5000 mg | INHALATION_SOLUTION | Freq: Four times a day (QID) | RESPIRATORY_TRACT | Status: DC | PRN

## 2024-01-22 MED ORDER — ACETAMINOPHEN 325 MG PO TABS: 650.0000 mg | ORAL_TABLET | Freq: Four times a day (QID) | ORAL | Status: DC | PRN

## 2024-01-22 MED ORDER — DULOXETINE HCL 60 MG PO CPEP
60.0000 mg | ORAL_CAPSULE | Freq: Every day | ORAL | Status: DC
  Administered 2024-01-22 – 2024-01-23 (×2): 60 mg via ORAL
  Filled 2024-01-22 (×2): qty 1

## 2024-01-22 MED ORDER — UMECLIDINIUM BROMIDE 62.5 MCG/ACT IN AEPB
1.0000 | INHALATION_SPRAY | Freq: Every day | RESPIRATORY_TRACT | Status: DC
  Administered 2024-01-22 – 2024-01-23 (×2): 1 via RESPIRATORY_TRACT
  Filled 2024-01-22: qty 7

## 2024-01-22 MED ORDER — DILTIAZEM HCL ER COATED BEADS 180 MG PO CP24
180.0000 mg | ORAL_CAPSULE | Freq: Every day | ORAL | Status: DC
  Administered 2024-01-22 – 2024-01-23 (×2): 180 mg via ORAL
  Filled 2024-01-22 (×2): qty 1

## 2024-01-22 MED ORDER — OXCARBAZEPINE 300 MG PO TABS
300.0000 mg | ORAL_TABLET | Freq: Two times a day (BID) | ORAL | Status: DC
Start: 2024-01-22 — End: 2024-01-23
  Administered 2024-01-22 – 2024-01-23 (×3): 300 mg via ORAL
  Filled 2024-01-22 (×4): qty 1

## 2024-01-22 MED ORDER — PIRFENIDONE 267 MG PO TABS
267.0000 mg | ORAL_TABLET | Freq: Every day | ORAL | Status: DC
Start: 2024-01-22 — End: 2024-01-22

## 2024-01-22 MED ORDER — RIVAROXABAN 10 MG PO TABS
10.0000 mg | ORAL_TABLET | Freq: Every evening | ORAL | Status: DC
  Administered 2024-01-22: 10 mg via ORAL
  Filled 2024-01-22: qty 1

## 2024-01-22 NOTE — Consult Note (Signed)
 Reason for Consult: Syncope Referring Physician: Teaching service  Andrea Berry is an 58 y.o. female.  HPI: Patient is a 58 year old female with past medical history significant for hypertrophic cardiomyopathy with no outflow tract obstruction/normal systolic anterior motion of mitral valve or mitral regurgitation, hypertensive heart disease with diastolic dysfunction, history of CVA with left paresis, history of PE, interstitial lung disease, history of transverse myelitis, blindness, chronic pain syndrome, history of COVID-19 in the past, lupus, hyperlipidemia, was admitted yesterday because of syncopal episode patient states she was getting a haircut by her 7 sitting on the commode and suddenly became unresponsive noted by his son and called EMS patient denies any palpitations dizziness lightheadedness prior to syncopal episode denies any chest pain.  Patient states she has been nauseous and has poor appetite for last few days patient was noted to be orthostatic at the time of admission.  Patient denies any seizure activity denies any weakness in the arms or legs 2D echo done earlier today which showed severe LVH with septal and apical hypertrophy with no evidence of outflow tract obstruction or SAM.  Patient presently denies any complaints.  EKG showed LVH with strain pattern no new acute ischemic changes were noted 2 sets of troponin has been negative.  Telemetry tracings shows no evidence of cardiac arrhythmia so far.  Past Medical History:  Diagnosis Date   Acute left hemiparesis on chronic left hemiparesis 04/12/2022   Acute respiratory distress syndrome (ARDS) due to COVID-19 virus (HCC) 03/27/2019   Acute respiratory failure with hypoxia (HCC) 02/16/2020   Anemia of chronic disease 10/28/2015   Anxiety 07/12/2018   Arthritis    Ataxia 01/03/2016   Blind    Blindness of both eyes 04/12/2022   CAP (community acquired pneumonia) 04/07/2023   Chronic central neuropathic pain 10/02/2018    [facility] 09/2017   Chronic coughing 09/10/2016   Chronic pain syndrome 07/12/2018   Chronic respiratory failure with hypoxia (HCC)    Chronic rhinitis 01/07/2019   COVID-19 02/04/2020   Decreased strength 10/08/2018   Proximal leg muscles   Depression    Depression with anxiety 01/03/2016   Devic's disease (HCC)    Dysesthesia 01/03/2016   Dyspnea on exertion 11/07/2018   Elevated BP without diagnosis of hypertension 08/28/2019   Report of [facility] nurse in December with normal SBPs but DBPs in low 90s    Eustachian tube dysfunction 11/18/2018   L>R. Followed by [facility].   Eustachian tube dysfunction, bilateral 09/18/2018   Eustachian tube dysfunction, left 11/13/2018   L>R. Followed by [facility].   Fall 05/10/2023   GERD (gastroesophageal reflux disease)    Greater trochanteric bursitis of left hip 04/27/2020   Formatting of this note might be different from the original. Added automatically from request for surgery [id number]  Formatting of this note might be different from the original. Added automatically from request for surgery [id number]   Headache(784.0)    History of chicken pox 10/08/2018   History of COVID-19 02/16/2020   History of CVA (cerebrovascular accident)    History of kidney stones    History of pulmonary embolism 12/10/2018   Lupus Antiphosholipid (+). Patient on secondary prophylaxis with rivaroxaban  20 mg daily long-term.     Hypertension    Hypertrophic cardiomyopathy (HCC) 03/02/2020   Hyponatremia 11/25/2015   Impairment of balance 10/08/2018   Kidney stone 09/14/2022   Legal blindness 01/30/2014   Liver masses 04/13/2022   MRI Abdomen 11/06/22 [facility]: Focus of  arterial phase hyperenhancement in the right hepatic lobe  is consistent with a vascular shunt. No evidence of hepatic mass. Cholelithiasis. No radiographic evidence of cholecystitis or biliary  ductal dilatation.      PET Scan 04/10/22 by Pulmonology for work-up of Solitary  Pulmonary nodule found "Multiple hypermetabolic hepatic mass lesions. Hepatic metastasis    Long-term corticosteroid use 10/08/2018   Lung nodule 05/24/2022   07/05/22 [facility] PET Scan: 1.  Interval decrease in size of a linear left lower lobe pulmonary nodule without radiotracer uptake, favored scarring in the setting of prior infection. No new pulmonary nodule.  2.  Waxing and waning nonspecific scattered hypermetabolic nodularity throughout both lungs with decreased metabolic activity of bilateral hilar lymph nodes and stable to slightly increased hepat   Lupus    Lupus anticoagulant disorder (HCC) 10/02/2018   Pulmonary Embolism 2017   Lupus cerebritis (HCC) 11/25/2015   Possible explanation of 11/25/2015 acute hyperintensity findings on Brain MRI.    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   Orthostatic dizziness, Recurrent 10/08/2018   Associated with medications   Otalgia 07/12/2018   Personal history of immunosupression therapy 10/08/2018   Chronic Azathioprine  and prednisone  therapy for Devic's disease   Pigmented skin lesion of uncertain nature 11/06/2018   Pneumonia 02/21/2020   Pneumonia of right lower lobe due to infectious organism 02/21/2020   Postmenopause 10/21/2020   Pulmonary embolism (HCC) 2017   Shortness of breath    due to medication   Skin lesion of chest wall 11/07/2018   SLE (systemic lupus erythematosus related syndrome) (HCC) 11/07/2018   Smith antibody positive 04/07/2019   Stroke (HCC) 2008   visually impaired   Stroke (HCC) 04/13/2022   Syncope    Transaminasemia 10/21/2020   Ureteral calculi 2018   Vaginal atrophy 09/14/2022   Vaginal odor 06/29/2022   Vitamin D  deficiency 10/06/2018    Past Surgical History:  Procedure Laterality Date   BREAST BIOPSY     BREAST CYST EXCISION Left pt unsure   COLONOSCOPY WITH PROPOFOL  N/A 07/26/2017   Procedure: COLONOSCOPY WITH PROPOFOL ;  Surgeon: Alvis Jourdain, MD;  Location:  [facility];  Service: Endoscopy;  Laterality: N/A;   ESOPHAGOGASTRODUODENOSCOPY (EGD) WITH PROPOFOL  N/A 07/26/2017   Procedure: ESOPHAGOGASTRODUODENOSCOPY (EGD) WITH PROPOFOL ;  Surgeon: Alvis Jourdain, MD;  Location: [facility];  Service: Endoscopy;  Laterality: N/A;   PORTA CATH INSERTION     TUBAL LIGATION     TUBAL LIGATION      Family History  Problem Relation Age of Onset   Cancer Mother    Cancer Father    Lupus Sister     Social History:  reports that she has never smoked. She has never used smokeless tobacco. She reports that she does not drink alcohol and does not use drugs.  Allergies:  Allergies  Allergen Reactions   Beta Adrenergic Blockers Other (See Comments)    Diagnosis of hypertrophic cardiomyopathy   Rocephin  [Ceftriaxone ] Other (See Comments)    Multiorgan inflammation with drug fever and multiple elevated inflammatory markers.  Resolved upon cessation of abx.    Medications: I have reviewed the patient's current medications.  Results for orders placed or performed during the hospital encounter of 01/21/24 (from the past 48 hours)  CBC with Differential/Platelet     Status: Abnormal   Collection Time: 01/21/24  9:20 PM  Result Value Ref Range   WBC 9.3 4.0 - 10.5 K/uL   RBC 5.28 (H) 3.87 - 5.11 MIL/uL  Hemoglobin 11.4 (L) 12.0 - 15.0 g/dL   HCT 16.1 09.6 - 04.5 %   MCV 72.5 (L) 80.0 - 100.0 fL   MCH 21.6 (L) 26.0 - 34.0 pg   MCHC 29.8 (L) 30.0 - 36.0 g/dL   RDW 40.9 (H) 81.1 - 91.4 %   Platelets 276 150 - 400 K/uL    Comment: REPEATED TO VERIFY   nRBC 0.0 0.0 - 0.2 %   Neutrophils Relative % 74 %   Neutro Abs 7.0 1.7 - 7.7 K/uL   Lymphocytes Relative 19 %   Lymphs Abs 1.7 0.7 - 4.0 K/uL   Monocytes Relative 5 %   Monocytes Absolute 0.5 0.1 - 1.0 K/uL   Eosinophils Relative 1 %   Eosinophils Absolute 0.1 0.0 - 0.5 K/uL   Basophils Relative 0 %   Basophils Absolute 0.0 0.0 - 0.1 K/uL   Immature Granulocytes 1 %   Abs Immature Granulocytes  0.05 0.00 - 0.07 K/uL    Comment: Performed at [facility], [address], [place], [place] [address]  Troponin I (High Sensitivity)     Status: None   Collection Time: 01/21/24  9:20 PM  Result Value Ref Range   Troponin I (High Sensitivity) 10 <18 ng/L    Comment: (NOTE) Elevated high sensitivity troponin I (hsTnI) values and significant  changes across serial measurements may suggest ACS but many other  chronic and acute conditions are known to elevate hsTnI results.  Refer to the "Links" section for chest pain algorithms and additional  guidance. Performed at [facility], [address] [place], [place] [address]   Comprehensive metabolic panel     Status: Abnormal   Collection Time: 01/21/24 10:38 PM  Result Value Ref Range   Sodium 140 135 - 145 mmol/L   Potassium 4.1 3.5 - 5.1 mmol/L   Chloride 107 98 - 111 mmol/L   CO2 23 22 - 32 mmol/L   Glucose, Bld 137 (H) 70 - 99 mg/dL    Comment: Glucose reference range applies only to samples taken after fasting for at least 8 hours.   BUN 8 6 - 20 mg/dL   Creatinine, Ser 8.65 (H) 0.44 - 1.00 mg/dL   Calcium  9.6 8.9 - 10.3 mg/dL   Total Protein 6.3 (L) 6.5 - 8.1 g/dL   Albumin  3.4 (L) 3.5 - 5.0 g/dL   AST 19 15 - 41 U/L   ALT 13 0 - 44 U/L   Alkaline Phosphatase 68 38 - 126 U/L   Total Bilirubin 0.2 0.0 - 1.2 mg/dL   GFR, Estimated >78 >46 mL/min    Comment: (NOTE) Calculated using the CKD-EPI Creatinine Equation (2021)    Anion gap 10 5 - 15    Comment: Performed at [facility], [address] [place], [place] [address]  Magnesium      Status: None   Collection Time: 01/21/24 10:38 PM  Result Value Ref Range   Magnesium  1.9 1.7 - 2.4 mg/dL    Comment: Performed at [facility], [address] [place], [place] [address]  Troponin I (High Sensitivity)     Status: None   Collection Time: 01/21/24 11:43 PM  Result Value Ref Range   Troponin I (High Sensitivity) 11 <18 ng/L    Comment:  (NOTE) Elevated high sensitivity troponin I (hsTnI) values and significant  changes across serial measurements may suggest ACS but many other  chronic and acute conditions are known to elevate  hsTnI results.  Refer to the "Links" section for chest pain algorithms and additional  guidance. Performed at [facility], [address], [place], [place] [address]   HIV Antibody (routine testing w rflx)     Status: None   Collection Time: 01/22/24  3:58 AM  Result Value Ref Range   HIV Screen 4th Generation wRfx Non Reactive Non Reactive    Comment: Performed at [facility], [address], [place], [place] [address]  Basic metabolic panel     Status: None   Collection Time: 01/22/24  3:58 AM  Result Value Ref Range   Sodium 140 135 - 145 mmol/L   Potassium 4.2 3.5 - 5.1 mmol/L   Chloride 106 98 - 111 mmol/L   CO2 23 22 - 32 mmol/L   Glucose, Bld 88 70 - 99 mg/dL    Comment: Glucose reference range applies only to samples taken after fasting for at least 8 hours.   BUN 8 6 - 20 mg/dL   Creatinine, Ser 0.98 0.44 - 1.00 mg/dL   Calcium  9.4 8.9 - 10.3 mg/dL   GFR, Estimated >11 >91 mL/min    Comment: (NOTE) Calculated using the CKD-EPI Creatinine Equation (2021)    Anion gap 11 5 - 15    Comment: Performed at [facility], [address], [place], [place] [address]  CBC     Status: Abnormal   Collection Time: 01/22/24  3:58 AM  Result Value Ref Range   WBC 6.7 4.0 - 10.5 K/uL   RBC 5.00 3.87 - 5.11 MIL/uL   Hemoglobin 10.6 (L) 12.0 - 15.0 g/dL   HCT 56.2 13.0 - 86.5 %   MCV 72.8 (L) 80.0 - 100.0 fL   MCH 21.2 (L) 26.0 - 34.0 pg   MCHC 29.1 (L) 30.0 - 36.0 g/dL   RDW 78.4 (H) 69.6 - 29.5 %   Platelets 337 150 - 400 K/uL   nRBC 0.0 0.0 - 0.2 %    Comment: Performed at [facility], [address] [place], [place] [address]  TSH     Status: None   Collection Time: 01/22/24  3:58 AM  Result Value Ref Range   TSH 2.003 0.350 - 4.500 uIU/mL    Comment:  Performed by a 3rd Generation assay with a functional sensitivity of <=0.01 uIU/mL. Performed at [facility], [address], [place], [place] [address]     ECHOCARDIOGRAM COMPLETE Result Date: 01/22/2024    ECHOCARDIOGRAM REPORT   Patient Name:   Andrea Berry Date of Exam: 01/22/2024 Medical Rec #:  [id number]          Height:       61.0 in Accession #:    [id number]         Weight:       178.1 lb Date of Birth:  November 17, 1965          BSA:          1.798 m Patient Age:    57 years           BP:           122/66 mmHg Patient Gender: F                  HR:           80 bpm. Exam Location:  Inpatient Procedure: 2D Echo, Cardiac Doppler and Color Doppler (Both Spectral and Color  Flow Doppler were utilized during procedure). Indications:    Syncope  History:        Patient has no prior history of Echocardiogram examinations and                 Patient has prior history of Echocardiogram examinations, most                 recent 04/12/2022. Signs/Symptoms:Syncope.  Sonographer:    Janette Medley Referring Phys: Omar Bibber IMPRESSIONS  1. Left ventricular ejection fraction, by estimation, is 70 to 75%. The left ventricle has hyperdynamic function. The left ventricle has no regional wall motion abnormalities. There is severe asymmetric left ventricular hypertrophy of the septal and apical segments. Left ventricular diastolic parameters are consistent with Grade I diastolic dysfunction (impaired relaxation).  2. Right ventricular systolic function is normal. The right ventricular size is normal. Tricuspid regurgitation signal is inadequate for assessing PA pressure.  3. The mitral valve is grossly normal. Trivial mitral valve regurgitation. No evidence of mitral stenosis.  4. The aortic valve was not well visualized. Aortic valve regurgitation is mild. No aortic stenosis is present.  5. The inferior vena cava is normal in size with greater than 50% respiratory variability, suggesting right atrial  pressure of 3 mmHg. FINDINGS  Left Ventricle: Left ventricular ejection fraction, by estimation, is 70 to 75%. The left ventricle has hyperdynamic function. The left ventricle has no regional wall motion abnormalities. The left ventricular internal cavity size was normal in size. There is severe asymmetric left ventricular hypertrophy of the septal and apical segments. Left ventricular diastolic parameters are consistent with Grade I diastolic dysfunction (impaired relaxation). Right Ventricle: The right ventricular size is normal. No increase in right ventricular wall thickness. Right ventricular systolic function is normal. Tricuspid regurgitation signal is inadequate for assessing PA pressure. Left Atrium: Left atrial size was normal in size. Right Atrium: Right atrial size was normal in size. Pericardium: There is no evidence of pericardial effusion. Mitral Valve: The mitral valve is grossly normal. Trivial mitral valve regurgitation. No evidence of mitral valve stenosis. Tricuspid Valve: The tricuspid valve is normal in structure. Tricuspid valve regurgitation is trivial. No evidence of tricuspid stenosis. Aortic Valve: The aortic valve was not well visualized. Aortic valve regurgitation is mild. No aortic stenosis is present. Aortic valve mean gradient measures 6.5 mmHg. Aortic valve peak gradient measures 12.8 mmHg. Aortic valve area, by VTI measures 1.92 cm. Pulmonic Valve: The pulmonic valve was not well visualized. Pulmonic valve regurgitation is trivial. No evidence of pulmonic stenosis. Aorta: The aortic root is normal in size and structure. Venous: The inferior vena cava is normal in size with greater than 50% respiratory variability, suggesting right atrial pressure of 3 mmHg. IAS/Shunts: No atrial level shunt detected by color flow Doppler.  LEFT VENTRICLE PLAX 2D LVIDd:         3.30 cm   Diastology LVIDs:         2.10 cm   LV e' medial:    5.44 cm/s LV PW:         1.00 cm   LV E/e' medial:  10.5 LV  IVS:        1.60 cm   LV e' lateral:   5.98 cm/s LVOT diam:     1.60 cm   LV E/e' lateral: 9.5 LV SV:         60 LV SV Index:   33 LVOT Area:     2.01 cm  RIGHT VENTRICLE             IVC RV S prime:     16.85 cm/s  IVC diam: 1.70 cm TAPSE (M-mode): 2.0 cm LEFT ATRIUM             Index        RIGHT ATRIUM           Index LA diam:        3.30 cm 1.84 cm/m   RA Area:     12.70 cm LA Vol (A2C):   36.3 ml 20.19 ml/m  RA Volume:   30.20 ml  16.80 ml/m LA Vol (A4C):   53.1 ml 29.53 ml/m LA Biplane Vol: 44.3 ml 24.64 ml/m  AORTIC VALVE AV Area (Vmax):    1.83 cm AV Area (Vmean):   1.77 cm AV Area (VTI):     1.92 cm AV Vmax:           179.00 cm/s AV Vmean:          116.500 cm/s AV VTI:            0.314 m AV Peak Grad:      12.8 mmHg AV Mean Grad:      6.5 mmHg LVOT Vmax:         162.50 cm/s LVOT Vmean:        102.750 cm/s LVOT VTI:          0.300 m LVOT/AV VTI ratio: 0.96  AORTA Ao Root diam: 2.50 cm Ao Asc diam:  3.10 cm MITRAL VALVE MV Area (PHT): 19.45 cm   SHUNTS MV Decel Time: 39 msec     Systemic VTI:  0.30 m MV E velocity: 57.10 cm/s  Systemic Diam: 1.60 cm Grady Lawman MD Electronically signed by Grady Lawman MD Signature Date/Time: 01/22/2024/3:00:01 PM    Final    DG Chest Port 1 View Result Date: 01/21/2024 CLINICAL DATA:  Syncope EXAM: PORTABLE CHEST 1 VIEW COMPARISON:  06/15/2023 FINDINGS: Single frontal view of the chest demonstrates stable enlargement of the cardiac silhouette. No acute airspace disease, effusion, or pneumothorax. No acute bony abnormalities. IMPRESSION: 1. Stable enlarged cardiac silhouette. 2. No acute airspace disease. Electronically Signed   By: Bobbye Burrow M.D.   On: 01/21/2024 22:07    Review of Systems  Constitutional:  Negative for fever.  HENT:  Negative for sore throat.   Eyes:  Negative for pain.  Respiratory:  Negative for cough and shortness of breath.   Cardiovascular:  Negative for chest pain, palpitations and leg swelling.  Genitourinary:   Negative for difficulty urinating.  Neurological:  Negative for seizures.   Blood pressure 122/66, pulse (!) 107, temperature 98 F (36.7 C), temperature source Oral, resp. rate 18, height 5\' 1"  (1.549 m), weight 80.8 kg, SpO2 99%. Physical Exam Constitutional:      Appearance: Normal appearance.  HENT:     Head: Normocephalic and atraumatic.  Neck:     Vascular: No carotid bruit.  Cardiovascular:     Rate and Rhythm: Normal rate and regular rhythm.     Heart sounds: No murmur heard.    No gallop.  Pulmonary:     Effort: Pulmonary effort is normal.     Breath sounds: Normal breath sounds.  Abdominal:     General: Abdomen is flat. Bowel sounds are normal.     Palpations: Abdomen is soft.  Musculoskeletal:     Cervical back: Neck supple.  Skin:    General:  Skin is warm and dry.  Neurological:     Mental Status: She is alert and oriented to person, place, and time.     Assessment/Plan: Status post syncope probably secondary to orthostatic hypotension rule out cardiac arrhythmias Hypertrophic septal and apical cardiomyopathy with no evidence of outflow tract obstruction. Hypertensive heart disease with diastolic dysfunction History of CVA Lupus Interstitial lung disease History of PE Hyperlipidemia Blindness Chronic pain syndrome History of transverse myelitis Plan Agree with present management Continue telemetry monitoring for 1 more day Encourage p.o. fluids Check orthostatics Increase diltiazem  as blood pressure tolerates Will arrange for event monitor as outpatient  Chapman Commodore 01/22/2024, 3:47 PM

## 2024-01-22 NOTE — ED Notes (Signed)
 Report given to 65M, nurse passed off information to nurse that the day shift team should call pt son to home medication pirfenidone because we don't have that medication here. This nurse didn't want to call and wake him up at 0400 am.

## 2024-01-22 NOTE — Assessment & Plan Note (Addendum)
 HTN: continue home Diltiazem  180 mg daily, monitor BPs Anxiety: Holding home Ativan  due to patient reporting taking only 1-2 dose per week, can add back as needed.  History of PE: continue on Xarelto , monitor for symptoms of tachycardia/SOB. Complex Chronic Pain: Continue home duloxetine  60 mg daily.  Holding home gabapentin  900 BID, add back as needed. HLD: continue home Lipitor 40 mg daily GERD: continue formulary equivalent Nexium  (Protonix  40 mg) daily SLE: On Rituximab 1000mg  outpatient every 6 months

## 2024-01-22 NOTE — ED Provider Notes (Signed)
 [place] [facility]ERGENCY DEPARTMENT AT [facility] Provider Note   CSN: [id number] Arrival date & time: 01/21/24  2107     History  Chief Complaint  Patient presents with   Loss of Consciousness    Andrea Berry is a 58 y.o. female.  Patient is a 58 year old female with a history of hypertension, lupus on chronic steroids, secondary adrenal insufficiency, chronic pain, hyponatremia, prior CVA, PEs on Xarelto , hypertension who is presenting today after a syncopal event at home.  Patient reports for the last 3 days she just has not felt great and has not had much appetite.  However she denies cough, shortness of breath, abdominal pain, vomiting, bowel changes, urinary issues.  She has had no recent medication changes.  Today she was sitting in her bathroom as her son was doing her hair and reports the next thing she remembers is waking up and the paramedics were there.  Her son reports she just syncopized.  She denies any prodromal symptoms.  She was seated at the time.  When EMS got there she was awake but when she tried to stand and walk she started getting very diaphoretic and lightheaded and they had to lay her down on the couch which she reports made her feel better.  She did receive fluids and route and reports now she is feeling better.  She denies any pain anywhere palpitations or shortness of breath.  Reports about 8 months ago she had a syncopal event but did not tell anybody and just kept going and this is the first 1 she has had since then.  She has been compliant with her medications and has not missed any doses.  There was no trauma today with this event.  The history is provided by the patient, the EMS personnel and medical records.  Loss of Consciousness      Home Medications Prior to Admission medications   Medication Sig Start Date End Date Taking? Authorizing Provider  albuterol  (VENTOLIN  HFA) 108 (90 Base) MCG/ACT inhaler Inhale 2 puffs into the lungs every 6  (six) hours as needed for wheezing or shortness of breath. 10/28/23   McDiarmid, Demetra Filter, MD  alendronate  (FOSAMAX ) 70 MG tablet Take 1 tablet (70 mg total) by mouth every Friday. 11/01/23   McDiarmid, Demetra Filter, MD  ALTAMIST SPRAY 0.65 % nasal spray Place 2 sprays into the nose 2 (two) times daily. 10/08/22   [provider]  amitriptyline  (ELAVIL ) 10 MG tablet Take 1 tablet (10 mg total) by mouth daily as needed (Headache). 11/08/23   McDiarmid, Demetra Filter, MD  atorvastatin  (LIPITOR) 40 MG tablet Take 1 tablet (40 mg total) by mouth daily. 10/28/23   McDiarmid, Demetra Filter, MD  azithromycin  (ZITHROMAX ) 250 MG tablet Take 1 tablet by mouth daily. 09/21/23 04/18/24  [provider]  baclofen  (LIORESAL ) 10 MG tablet Take 1 tablet (10 mg total) by mouth 2 (two) times daily. 08/16/23   McDiarmid, Demetra Filter, MD  Cholecalciferol (VITAMIN D3) 50 MCG (2000 UT) TABS Take 2 tablets by mouth daily.    [provider]  diltiazem  (CARDIZEM  CD) 180 MG 24 hr capsule Take 1 capsule (180 mg total) by mouth daily. 10/28/23   McDiarmid, Demetra Filter, MD  DULoxetine  (CYMBALTA ) 60 MG capsule TAKE 1 CAPSULE BY MOUTH EVERY DAY 11/28/23   McDiarmid, Demetra Filter, MD  DULoxetine  (CYMBALTA ) 60 MG capsule Take 1 capsule (60 mg total) by mouth daily. 05/10/23   McDiarmid, Demetra Filter, MD  esomeprazole  (NEXIUM )  20 MG capsule Take 1 capsule (20 mg total) by mouth daily at 12 noon. 10/28/23   McDiarmid, Demetra Filter, MD  gabapentin  (NEURONTIN ) 300 MG capsule Take 2 capsules (600 mg total) by mouth 2 (two) times daily. 04/08/23   Limmie Ren, MD  linaclotide  (LINZESS ) 145 MCG CAPS capsule Take 1 capsule (145 mcg total) by mouth daily before breakfast. 10/28/23   McDiarmid, Demetra Filter, MD  LORazepam  (ATIVAN ) 0.5 MG tablet TAKE 1 TABLET (0.5 MG TOTAL) BY MOUTH TWO (TWO) TIMES DAILY 12/10/23   McDiarmid, Demetra Filter, MD  LORazepam  (ATIVAN ) 0.5 MG tablet Take 1 tablet (0.5 mg total) by mouth 2 (two) times daily. 12/10/23   McDiarmid, Demetra Filter, MD  ondansetron  (ZOFRAN ) 4  MG tablet Take 1 tablet (4 mg total) by mouth every 8 (eight) hours as needed for nausea or vomiting. 05/30/23   McDiarmid, Demetra Filter, MD  ondansetron  (ZOFRAN -ODT) 4 MG disintegrating tablet Take 1 tablet by mouth every 8 hours as needed for nausea and vomiting 05/31/23   McDiarmid, Demetra Filter, MD  OXcarbazepine  (TRILEPTAL ) 150 MG tablet Take 300 mg by mouth 2 (two) times daily. 08/10/23   [provider]  OXcarbazepine  (TRILEPTAL ) 150 MG tablet Take 2 tablets (300 mg total) by mouth 2 (two) times daily. 12/09/23     Oxcarbazepine  (TRILEPTAL ) 300 MG tablet Take 300 mg by mouth daily.    [provider]  Pirfenidone 267 MG TABS Take by mouth. 09/21/23   [provider]  predniSONE  (DELTASONE ) 10 MG tablet Take 0.5 tablets (5 mg total) by mouth daily. 10/28/23   McDiarmid, Demetra Filter, MD  predniSONE  (DELTASONE ) 10 MG tablet Take 0.5 tablets (5 mg total) by mouth daily. 09/25/23   McDiarmid, Demetra Filter, MD  RiTUXimab (RITUXAN IV) Inject 1,000 mg into the vein See admin instructions. 1,000 mg into the vein every 6 months, then two weeks later another 1,000 mg injection    [provider]  rivaroxaban  (XARELTO ) 10 MG TABS tablet Take 1 tablet (10 mg total) by mouth daily. 10/28/23   McDiarmid, Demetra Filter, MD  rivaroxaban  (XARELTO ) 10 MG TABS tablet Take 1 tablet (10 mg total) by mouth daily. 12/11/23   McDiarmid, Demetra Filter, MD  tiotropium (SPIRIVA  HANDIHALER) 18 MCG inhalation capsule Place 1 capsule (18 mcg total) into inhaler and inhale daily. 10/28/23   McDiarmid, Demetra Filter, MD  traMADol  (ULTRAM ) 50 MG tablet Take 1 tablet (50 mg total) by mouth every 6 (six) hours as needed. 10/28/23   McDiarmid, Demetra Filter, MD  traZODone  (DESYREL ) 50 MG tablet Take 0.5-1 tablets (25-50 mg total) by mouth at bedtime as needed for sleep. 11/14/23   McDiarmid, Demetra Filter, MD      Allergies    Beta adrenergic blockers and Rocephin  [ceftriaxone ]    Review of Systems   Review of Systems  Cardiovascular:  Positive for syncope.     Physical Exam Updated Vital Signs BP (!) 110/53   Pulse 85   Temp 97.9 F (36.6 C)   Resp (!) 22   Ht 5\' 1"  (1.549 m)   Wt 80.8 kg   SpO2 100%   BMI 33.66 kg/m  Physical Exam Vitals and nursing note reviewed.  Constitutional:      General: She is not in acute distress.    Appearance: She is well-developed.  HENT:     Head: Normocephalic and atraumatic.     Mouth/Throat:     Mouth: Mucous membranes are dry.  Eyes:  Pupils: Pupils are equal, round, and reactive to light.  Cardiovascular:     Rate and Rhythm: Normal rate and regular rhythm.     Heart sounds: Normal heart sounds. No murmur heard.    No friction rub.  Pulmonary:     Effort: Pulmonary effort is normal.     Breath sounds: Normal breath sounds. No wheezing or rales.  Abdominal:     General: Bowel sounds are normal. There is no distension.     Palpations: Abdomen is soft.     Tenderness: There is no abdominal tenderness. There is no guarding or rebound.  Musculoskeletal:        General: No tenderness. Normal range of motion.     Right lower leg: No edema.     Left lower leg: No edema.     Comments: No edema  Skin:    General: Skin is warm and dry.     Findings: No rash.  Neurological:     Mental Status: She is alert and oriented to person, place, and time.     Cranial Nerves: No cranial nerve deficit.     Comments: Chronic weakness on the left side of her face and arm  Psychiatric:        Behavior: Behavior normal.     ED Results / Procedures / Treatments   Labs (all labs ordered are listed, but only abnormal results are displayed) Labs Reviewed  CBC WITH DIFFERENTIAL/PLATELET - Abnormal; Notable for the following components:      Result Value   RBC 5.28 (*)    Hemoglobin 11.4 (*)    MCV 72.5 (*)    MCH 21.6 (*)    MCHC 29.8 (*)    RDW 16.7 (*)    All other components within normal limits  URINALYSIS, W/ REFLEX TO CULTURE (INFECTION SUSPECTED)  COMPREHENSIVE METABOLIC PANEL WITH GFR   MAGNESIUM   TROPONIN I (HIGH SENSITIVITY)  TROPONIN I (HIGH SENSITIVITY)    EKG EKG Interpretation Date/Time:  Tuesday Jan 21 2024 21:12:02 EDT Ventricular Rate:  88 PR Interval:  159 QRS Duration:  83 QT Interval:  356 QTC Calculation: 431 R Axis:   52  Text Interpretation: Sinus rhythm Probable left atrial enlargement LVH with secondary repolarization abnormality ST depression, consider ischemia, diffuse lds No significant change since last tracing Confirmed by Almond Army ([id number]) on 01/21/2024 9:48:15 PM  Radiology DG Chest Port 1 View Result Date: 01/21/2024 CLINICAL DATA:  Syncope EXAM: PORTABLE CHEST 1 VIEW COMPARISON:  06/15/2023 FINDINGS: Single frontal view of the chest demonstrates stable enlargement of the cardiac silhouette. No acute airspace disease, effusion, or pneumothorax. No acute bony abnormalities. IMPRESSION: 1. Stable enlarged cardiac silhouette. 2. No acute airspace disease. Electronically Signed   By: Bobbye Burrow M.D.   On: 01/21/2024 22:07    Procedures Procedures    Medications Ordered in ED Medications  lactated ringers  bolus 500 mL (500 mLs Intravenous New Bag/Given 01/21/24 2233)    ED Course/ Medical Decision Making/ A&P                                 Medical Decision Making Amount and/or Complexity of Data Reviewed Independent Historian: EMS External Data Reviewed: notes. Labs: ordered. Decision-making details documented in ED Course. Radiology: ordered and independent interpretation performed. Decision-making details documented in ED Course. ECG/medicine tests: ordered and independent interpretation performed. Decision-making details documented in ED Course.   Pt with multiple  medical problems and comorbidities and presenting today with a complaint that caries a high risk for morbidity and mortality.  Here today after an unprovoked syncopal event.  Patient was mildly hypotensive here with improvement after fluids.  Poor oral intake  over the last 3 days.  Unclear why she has not had an appetite as she denies all other symptoms.  Concern for electrolyte abnormalities, AKI, anemia, dysrhythmia, ACS.  Low suspicion for PE as patient is on Xarelto  and has not missed any doses.  Low suspicion for stroke at this time.  I independently interpreted patient's labs and EKG.  EKG without acute findings.  No evidence of dysrhythmia.  Troponin within normal limits, CBC without evidence of anemia or elevated white count, CMP and magnesium  are pending.  I have independently visualized and interpreted pt's images today.  Chest x-ray within normal limits today.  Patient remained on continuous cardiac monitoring and she remains in sinus rhythm.          Final Clinical Impression(s) / ED Diagnoses Final diagnoses:  None    Rx / DC Orders ED Discharge Orders     None         Almond Army, MD 01/22/24 [phone]

## 2024-01-22 NOTE — Evaluation (Signed)
 Physical Therapy Evaluation Patient Details Name: Andrea Berry MRN: [id number] DOB: [birth year] Today's Date: 01/22/2024  History of Present Illness  Patient is 58 y.o. female presented for syncope of unclear etiology. History of similar episode ~8 months ago. PMH includes: multiple prior strokes with Lt sided hemiparesis, SLE, hx of PE on xarelto , legally blind, anxiety, depression, Devic's disease, GERD, HTN.   Clinical Impression  Andrea Berry is 58 y.o. female admitted with above HPI and diagnosis. Patient is currently limited by functional impairments below (see PT problem list). Patient lives with son and is mod ind with white sight stick for mobility in home and community at baseline. Patient completed bed mobility at supervision/CGA level. Steady with seated balance at EOB and completed sit<>stand from with no AD and able to set up sight stick while standing EOB. Patient amb ~110' in hallway with min cues for navigation and good use of sight stick to scan environment. No overt LOB noted though slight trendelenburg gait noted. Pt returned and Echo arrived for test, pt returned to bed. Patient will benefit from continued skilled PT interventions to address impairments and progress independence with mobility, recommending HHPT. Acute PT will follow and progress as able.         If plan is discharge home, recommend the following: A little help with walking and/or transfers;A little help with bathing/dressing/bathroom;Assistance with cooking/housework;Direct supervision/assist for medications management;Assist for transportation;Help with stairs or ramp for entrance   Can travel by private vehicle        Equipment Recommendations None recommended by PT  Recommendations for Other Services       Functional Status Assessment Patient has had a recent decline in their functional status and demonstrates the ability to make significant improvements in function in a reasonable and  predictable amount of time.     Precautions / Restrictions Precautions Precautions: Fall Precaution/Restrictions Comments: blind, uses white stick for mobility Restrictions Weight Bearing Restrictions Per Provider Order: No      Mobility  Bed Mobility Overal bed mobility: Needs Assistance Bed Mobility: Supine to Sit, Sit to Supine     Supine to sit: Supervision Sit to supine: Supervision   General bed mobility comments: extra time, sup for safety    Transfers Overall transfer level: Needs assistance Equipment used:  (white sight cane) Transfers: Sit to/from Stand Sit to Stand: Contact guard assist           General transfer comment: CGA for safety from EOB. pt able to set up sight stick while standing at bedside.    Ambulation/Gait Ambulation/Gait assistance: Contact guard assist Gait Distance (Feet): 110 Feet Assistive device:  (white sight cane) Gait Pattern/deviations: Step-through pattern, Decreased stride length, Decreased dorsiflexion - left, Decreased step length - left Gait velocity: decr     General Gait Details: no overt LOB noted. good use of stick to scan environment.  Stairs            Wheelchair Mobility     Tilt Bed    Modified Rankin (Stroke Patients Only)       Balance Overall balance assessment: Needs assistance Sitting-balance support: Feet supported Sitting balance-Leahy Scale: Good     Standing balance support: During functional activity, Single extremity supported, Reliant on assistive device for balance Standing balance-Leahy Scale: Fair                               Pertinent Vitals/Pain  Home Living Family/patient expects to be discharged to:: Private residence Living Arrangements: Children Available Help at Discharge: Family;Available PRN/intermittently Type of Home: House Home Access: Stairs to enter       Home Layout: One level Home Equipment: Agricultural consultant (2 wheels);Cane - single  point;Shower seat;BSC/3in1;Wheelchair - manual;Toilet riser;Other (comment) (white stick for mobility) Additional Comments: son works during the day, has a home aid 4 hours every day in the afternoon/evening (3pm-7pm). hleps with bathing and meal prep.    Prior Function Prior Level of Function : Independent/Modified Independent             Mobility Comments: Uses white sight cane for community mobility ADLs Comments: has aide 4 hours/day to assist with home tasks as needed.     Extremity/Trunk Assessment   Upper Extremity Assessment Upper Extremity Assessment: Overall WFL for tasks assessed    Lower Extremity Assessment Lower Extremity Assessment: Overall WFL for tasks assessed    Cervical / Trunk Assessment Cervical / Trunk Assessment: Normal  Communication   Communication Communication: No apparent difficulties    Cognition Arousal: Alert Behavior During Therapy: WFL for tasks assessed/performed   PT - Cognitive impairments: No apparent impairments                         Following commands: Intact       Cueing Cueing Techniques: Verbal cues     General Comments      Exercises     Assessment/Plan    PT Assessment Patient needs continued PT services  PT Problem List Decreased strength;Decreased range of motion;Decreased activity tolerance;Decreased balance;Decreased mobility;Decreased knowledge of use of DME;Decreased safety awareness;Decreased knowledge of precautions;Obesity       PT Treatment Interventions DME instruction;Gait training;Stair training;Functional mobility training;Therapeutic activities;Therapeutic exercise;Balance training;Patient/family education    PT Goals (Current goals can be found in the Care Plan section)  Acute Rehab PT Goals PT Goal Formulation: With patient Time For Goal Achievement: 02/05/24 Potential to Achieve Goals: Good    Frequency Min 2X/week     Co-evaluation               AM-PAC PT "6 Clicks"  Mobility  Outcome Measure Help needed turning from your back to your side while in a flat bed without using bedrails?: A Little Help needed moving from lying on your back to sitting on the side of a flat bed without using bedrails?: A Little Help needed moving to and from a bed to a chair (including a wheelchair)?: A Little Help needed standing up from a chair using your arms (e.g., wheelchair or bedside chair)?: A Little Help needed to walk in hospital room?: A Little Help needed climbing 3-5 steps with a railing? : A Lot 6 Click Score: 17    End of Session Equipment Utilized During Treatment: Gait belt Activity Tolerance: Patient tolerated treatment well Patient left: in bed;with call bell/phone within reach;with bed alarm set Nurse Communication: Mobility status PT Visit Diagnosis: Other abnormalities of gait and mobility (R26.89);Muscle weakness (generalized) (M62.81);Difficulty in walking, not elsewhere classified (R26.2)    Time: 4098-1191 PT Time Calculation (min) (ACUTE ONLY): 33 min   Charges:     PT Treatments $Gait Training: 8-22 mins $Therapeutic Activity: 8-22 mins PT General Charges $$ ACUTE PT VISIT: 1 Visit         Tish Forge, DPT Acute Rehabilitation Services Office [phone]  01/22/24 12:23 PM

## 2024-01-22 NOTE — ED Provider Notes (Signed)
 Care of patient assumed from Dr. Leida Puna.  This patient presents after a syncopal episode.  While at home, getting her hair done, she had a witnessed and Cabbell episode with no prodrome.  She came to around the time of EMS arrival.  When she tended to ambulate, she got diaphoretic.  She endorses fatigue for the past 3 days. Physical Exam  BP (!) 110/53   Pulse 85   Temp 97.9 F (36.6 C)   Resp (!) 22   Ht 5\' 1"  (1.549 m)   Wt 80.8 kg   SpO2 100%   BMI 33.66 kg/m   Physical Exam Vitals and nursing note reviewed.  Constitutional:      General: She is not in acute distress.    Appearance: Normal appearance. She is well-developed. She is not ill-appearing, toxic-appearing or diaphoretic.  HENT:     Head: Normocephalic and atraumatic.     Right Ear: External ear normal.     Left Ear: External ear normal.     Nose: Nose normal.     Mouth/Throat:     Mouth: Mucous membranes are moist.  Eyes:     Extraocular Movements: Extraocular movements intact.     Conjunctiva/sclera: Conjunctivae normal.  Cardiovascular:     Rate and Rhythm: Normal rate and regular rhythm.  Pulmonary:     Effort: Pulmonary effort is normal. No respiratory distress.  Abdominal:     General: There is no distension.     Palpations: Abdomen is soft.  Musculoskeletal:        General: No swelling or deformity.     Cervical back: Normal range of motion and neck supple.  Skin:    General: Skin is warm and dry.     Coloration: Skin is not jaundiced or pale.     Procedures  Procedures  ED Course / MDM    Medical Decision Making Amount and/or Complexity of Data Reviewed Labs: ordered. Radiology: ordered.  Risk Decision regarding hospitalization.   On assessment, patient sleeping.  Vital signs remain normal.  She remains in a normal sinus rhythm.  She has not had any arrhythmias while in the ED.  CMP and repeat troponin were unremarkable.  Patient was admitted for further management.       Iva Mariner, MD 01/22/24 [phone]

## 2024-01-22 NOTE — Care Management Obs Status (Signed)
 MEDICARE OBSERVATION STATUS NOTIFICATION   Patient Details  Name: Andrea Berry MRN: [id number] Date of Birth: [birth year]   Medicare Observation Status Notification Given:  Yes    Tom-Johnson, Angelique Ken, RN 01/22/2024, 4:58 PM

## 2024-01-22 NOTE — ED Notes (Signed)
Gave report to receiving RN.

## 2024-01-22 NOTE — Hospital Course (Addendum)
 Andrea Berry is a 59 y.o. year old with a history of HTN, PE, SLE, GERD, ILD, h/o CVA with residual left sided deficits, neuromyelitis optica with blindness, HTN, hypertrophic cardiomyopathy (ECHO Aug 2023 EF 60-65% moderate septal hypertrophy) who presented from home with unprovoked syncope lasting several seconds and was admitted to the [facility] Medicine Teaching service for workup.  Hospital course is below.  Syncope and collapse No concern for seizure on history and returned to baseline shortly after episode.  In the ED, labs reveal very mild anemia (hemoglobin 11.4), CMP unremarkable, troponins negative.  CXR unremarkable.  Neurological exam was normal.  Patient was somewhat hypotensive, though this improved after IV fluids.  On admission she underwent echocardiogram which showed EF 70-75% and some hyperdynamic function of LV w/out WMA.  Orthostatic vitals were positive.  Patient's outpatient Cardiologist Dr. Glena Landau was consulted given repeat syncope and history of HOCM.  After evaluating patient, Dr. Glena Landau agreed with current workup with plan for outpatient cardiac event monitor and possible increase of diltiazem .  Etiology of syncope remains unclear, though cardiac causes remain a consideration.  Discharged with HHPT.  Patient will follow up at [facility] on 5/20.  Other chronic conditions were medically managed with home medications and formulary alternatives as necessary (HTN, Anxiety, h/o PE, complex chronic pain, HLD, GERD, SLE).  PCP Follow-up Recommendations: Plan for Zio patch outpatient per Cardiology, please ensure outpatient follow up with Dr. Glena Landau and/or receipt of cardiac event monitor Follow up positive orthostatic vitals, no changes made inpatient to medication regimen from that standpoint Recommend consideration of weaning lorazepam  0.5 mg BID or converting to longer acting benzodiazepine, in particular in setting of polypharmacy with gabapentin , tizanidine  (not continued), and  trazodone 

## 2024-01-22 NOTE — Assessment & Plan Note (Addendum)
 Unprovoked syncopal event.  Has had prior similar to this approximately 8 months ago.  Workup thus far unremarkable.  Last echo in 2023 with LVEF 60-65%. -Admit to MedSurg with FMTS, Dr. Drue Gerald attending -Obtain orthostatic vitals -PT/OT eval and treat -Fall precautions -AM CBC, BMP, TSH -Encourage good PO intake -Echocardiogram

## 2024-01-22 NOTE — ED Notes (Signed)
 Was in informed by pharmacy that we don't cary pirfenidone would need to bring her own

## 2024-01-22 NOTE — ED Notes (Signed)
 Called lab to get add ons

## 2024-01-22 NOTE — H&P (Signed)
 Hospital Admission History and Physical Service Pager: [phone]  Patient name: Andrea Berry Medical record number: [id number] Date of Birth: 1966-07-23 Age: 58 y.o. Gender: female  Primary Care Provider: McDiarmid, Demetra Filter, MD Consultants: None Code Status: Full Code   Preferred Emergency Contact:  Contact Information     Name Relation Home Work [place] Son   [phone]   [place] Manual [phone]  [phone]      Other Contacts     Name Relation Home Work Mobile   ahligo,francoise Other   [phone]        Chief Complaint: syncope  Assessment and Plan: Andrea Berry is a 58 y.o. female presenting with syncope. Differential for presentation of this includes ACS, PE, orthostatic.   -Unclear primary etiology at this time.  Low suspicion for emergent ACS causes such as MI; EKG unremarkable, no evidence of arrhythmia, troponins negative. -Very low concern for PE given patient without shortness of breath, no increased work of breathing; she is on Xarelto  10 mg daily home. -Possible orthostatic cause, particularly given patient with decreased appetite/oral intake in the last several days.  Hypovolemia possible, patient was somewhat hypotensive on arrival to ED.  Assessment & Plan Syncope Unprovoked syncopal event.  Has had prior similar to this approximately 8 months ago.  Workup thus far unremarkable.  Last echo in 2023 with LVEF 60-65%. -Admit to MedSurg with FMTS, Dr. Drue Gerald attending -Obtain orthostatic vitals -PT/OT eval and treat -Fall precautions -AM CBC, BMP, TSH -Encourage good PO intake -Echocardiogram Chronic health problem HTN: continue home Diltiazem  180 mg daily, monitor BPs Anxiety: Holding home Ativan  due to patient reporting taking only 1-2 dose per week, can add back as needed.  History of PE: continue on Xarelto , monitor for symptoms of tachycardia/SOB. Complex Chronic Pain: Continue home duloxetine  60 mg daily.   Holding home gabapentin  900 BID, add back as needed. HLD: continue home Lipitor 40 mg daily GERD: continue formulary equivalent Nexium  (Protonix  40 mg) daily SLE: On Rituximab 1000mg  outpatient every 6 months   FEN/GI: Regular diet VTE Prophylaxis: Home Xarelto  10 mg daily  Disposition: Med telemetry  History of Present Illness:  Andrea Berry is a 58 y.o. female presenting with syncope.   At the time of the episode she was sitting on the toilet, getting her hair cut by her son.  She does not remember anything about the syncopal event, just recalls waking up with "lots of strange people" in the house.  She denies any symptoms prior to syncopal event.  She did not lose control of bowel/bladder.  No apparent postictal state, and her son did not describe any shaking/seizure-like activity to her.  She does report that she has had a decreased appetite for the last several days, but feels that she has been staying well-hydrated.  No recent illness/fevers, vomiting, diarrhea.  She does note that 8-9 months ago she had a similar episode to this where she felt dizzy and passed out.  She came to on the floor.  This episode was unwitnessed and she did not tell anyone at the time.  In the ED, labs reveal very mild anemia (hemoglobin 11.4), CMP unremarkable, troponins negative.  CXR unremarkable.  Patient was somewhat hypotensive, though this improved after IV fluids.  Review Of Systems: Per HPI.  Pertinent Past Medical History: SLE ILD PE (2017) TIA HTN GERD Anemia Blindness - Devic's Disease/neuromyelitis optica Remainder reviewed in history tab.   Pertinent Past Surgical History:  EGD, colonoscopy (2018) Tubal ligation Breast cyst excision Remainder reviewed in history tab.   Pertinent Social History: Tobacco use: No Alcohol use: No Other Substance use: No Lives with Son  Pertinent Family History: Mother/ Sister x 2 - CVA Father - Renal Failure Unlces- w Cancer Sister and  niece w -lupus Remainder reviewed in history tab.   Important Outpatient Medications: Lipitor 40 mg Alendronate  70 mg qFriday Diltiazem  180 mg daily Xarelto  10 mg daily Gabapentin  900 mg BID Prednisone  5 mg daily Duloxetine  60 mg daily Tramadol  50 mg q6 PRN, hasn't been using it much.  Lorazepam  0.5 mg every other day, last took it 2 days ago. Nexium  20 mg daily at noon Linzess  145 mcg daily on empty stomach Oxcarbazepine  300 mg daily Rituximab IV every 6 months Tizanidine  4 mg prn Pirfenidone 267 TID - for ILD Azithromycin  250 mg daily Vitamin D3 50 mcg Remainder reviewed in medication history.   Objective: BP (!) 110/53   Pulse 85   Temp 97.9 F (36.6 C)   Resp (!) 22   Ht 5\' 1"  (1.549 m)   Wt 80.8 kg   SpO2 100%   BMI 33.66 kg/m  Exam: General: Pleasant female, no acute distress. HEENT: normocephalic, MMM. Cardio: Regular rate, regular rhythm, no murmurs on exam. Pulm: Clear, no wheezing, no crackles. No increased work of breathing on room air. Abdominal: bowel sounds present, soft, non-tender, non-distended. Extremities: no peripheral edema. Moves all extremities equally. Neuro: Alert and oriented x3, speech normal in content, no facial asymmetry. Psych:  Cognition and judgment appear intact. Alert, communicative, and cooperative with normal attention span and concentration.   Labs:  CBC BMET  Recent Labs  Lab 01/21/24 2120  WBC 9.3  HGB 11.4*  HCT 38.3  PLT 276   Recent Labs  Lab 01/21/24 2238  NA 140  K 4.1  CL 107  CO2 23  BUN 8  CREATININE 1.03*  GLUCOSE 137*  CALCIUM  9.6    Troponins 10 > 11 Magnesium  1.9  EKG: Sinus rhythm, no QTc prolongation, no ST elevations.   5/13 CXR: IMPRESSION: 1. Stable enlarged cardiac silhouette. 2. No acute airspace disease.   Omar Bibber, DO 01/22/2024, 2:17 AM PGY-1, [facility] Family Medicine  FPTS Intern pager: [phone], text pages welcome Secure chat group [facility]  Teaching Service

## 2024-01-22 NOTE — Care Management CC44 (Signed)
 Condition Code 44 Documentation Completed  Patient Details  Name: Ghia Suto MRN: [id number] Date of Birth: Aug 31, 1966   Condition Code 44 given:  Yes Patient signature on Condition Code 44 notice:  Yes Documentation of 2 MD's agreement:  Yes Code 44 added to claim:  Yes    Tom-Johnson, Angelique Ken, RN 01/22/2024, 4:58 PM

## 2024-01-22 NOTE — Plan of Care (Signed)
 Interim Progress Note  See H&P this AM 5/14 for full attending attestation.  Intern Pager: [phone]  Patient name: Andrea Berry Medical record number: [id number] Date of birth: 17-Jan-1966 Age: 58 y.o. Gender: female  Primary Care Provider: McDiarmid, Demetra Filter, MD Consultants: None Code Status: FULL  Pt Overview and Major Events to Date:  5/14 - Admitted ON, Cardiology consulted   Assessment and Plan: Andrea Berry is a 58 y.o. 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 Assessment & Plan Syncope Unprovoked syncopal event with prior similar approximately 8 months ago.  History is concerning for arrhythmia.  Orthostatic vitals positive given increase in HR.  TSH WNL. - PT/OT eval and treat - Fall precautions - Cardiac monitoring changed to continuous - Echocardiogram scheduled for 11 AM - Consider discharge with Holter monitor/Zio patch - AM BMP, Mag - Consult to Cardiology, follow up recs Anemia Microcytic anemia with elevated RDW, at baseline Hgb 10-11. Chronic health problem HTN: continue home Diltiazem  180 mg daily, monitor BPs Anxiety: Holding home Ativan  due to patient reporting taking every other day, can add back as needed History of PE: Continue on Xarelto , monitor for symptoms of tachycardia/SOB Complex Chronic Pain: Continue home duloxetine  60 mg daily, holding home gabapentin  600 mg BID, add back as needed HLD: Continue home atorvastatin  40 mg daily GERD: Continue formulary equivalent esomeprazole  20 mg (pantoprazole  40 mg) daily SLE: On Rituximab 1000 mg outpatient every 6 months ILD: Prednisone  5 mg daily, family will bring Pirfenidone 267 mg TID from home (not on formulary)  FEN/GI: Regular diet PPx: Xarelto  10 mg daily Dispo: Home pending syncope  workup.  Subjective: NAEON.  Patient resting in bed comfortably this AM and reports she is feeling well, hopes she will not have to remain hospitalized for long.  Objective: Temp:  [97.9 F (36.6 C)-98.3 F (36.8 C)] 98.3 F (36.8 C) (05/14 0434) Pulse Rate:  [62-85] 62 (05/14 0434) Resp:  [18-22] 18 (05/14 0434) BP: (110-141)/(53-77) 141/73 (05/14 0434) SpO2:  [96 %-100 %] 96 % (05/14 0434) Weight:  [80.8 kg] 80.8 kg (05/13 2111)  Physical Exam: General: Age-appropriate, resting comfortably in bed, NAD, alert and at baseline. Cardiovascular: Regular rate and rhythm. Normal S1/S2. Possible systolic ejection murmur.  No rubs or gallops appreciated. 2+ radial pulses. Pulmonary: Clear bilaterally to auscultation; no wheezes, crackles, or rhonchi. Normal WOB on room air. Abdominal: Normoactive bowel sounds, nondistended. No tenderness to deep or light palpation. No rebound or guarding. No HSM. Skin: Warm and dry. Extremities: No peripheral edema bilaterally. Capillary refill <2 seconds.  Laboratory: Most recent CBC Lab Results  Component Value Date   WBC 6.7 01/22/2024   HGB 10.6 (L) 01/22/2024   HCT 36.4 01/22/2024   MCV 72.8 (L) 01/22/2024   PLT 337 01/22/2024   Most recent BMP    Latest Ref Rng & Units 01/22/2024    3:58 AM  BMP  Glucose 70 - 99 mg/dL 88   BUN 6 - 20 mg/dL 8   Creatinine 1.47 - 8.29 mg/dL 5.62   Sodium 130 - 865 mmol/L 140   Potassium 3.5 - 5.1 mmol/L 4.2   Chloride 98 - 111 mmol/L 106   CO2 22 - 32 mmol/L 23   Calcium  8.9 - 10.3 mg/dL 9.4     Other pertinent  labs: - TSH WNL  New Imaging/Diagnostic Tests: - Echo scheduled for 11 AM  Brandan Glauber, MD 01/22/2024, 7:19 AM  PGY-1, [facility] Family Medicine FPTS Intern pager: [phone], text pages welcome Secure chat group [facility] Teaching Service

## 2024-01-22 NOTE — Plan of Care (Signed)
 Notified by on-call cardiology that patient is a former patient of Dr. Timoteo Force.  Spoke with Dr. Glena Landau over the phone for consult.  Discussed patient's current admission and workup.  Dr. Glena Landau will see patient later.  Greatly appreciate Dr. Glena Landau and his team for their care of this patient.

## 2024-01-22 NOTE — Assessment & Plan Note (Addendum)
 HTN: continue home Diltiazem  180 mg daily, monitor BPs Anxiety: Holding home Ativan  due to patient reporting taking every other day, can add back as needed History of PE: Continue on Xarelto , monitor for symptoms of tachycardia/SOB Complex Chronic Pain: Continue home duloxetine  60 mg daily, holding home gabapentin  600 mg BID, add back as needed HLD: Continue home atorvastatin  40 mg daily GERD: Continue formulary equivalent esomeprazole  20 mg (pantoprazole  40 mg) daily SLE: On Rituximab 1000 mg outpatient every 6 months ILD: Prednisone  5 mg daily, family will bring Pirfenidone 267 mg TID from home (not on formulary)

## 2024-01-22 NOTE — Evaluation (Signed)
 Occupational Therapy Evaluation Patient Details Name: Andrea Berry MRN: [id number] DOB: Nov 08, 1965 Today's Date: 01/22/2024   History of Present Illness   Patient is 58 y.o. female presented for syncope of unclear etiology. History of similar episode ~8 months ago. PMH includes: multiple prior strokes with Lt sided hemiparesis, SLE, hx of PE on xarelto , legally blind, anxiety, depression, Devic's disease, GERD, HTN.     Clinical Impressions Patient is currently requiring as high as Minimal assistance with basic ADLs, as well as  supervision assist with bed mobility and up to CG assist with functional transfers to EOB<>Recliner with use of tactile and verbal cues.   Current level of function is close to patient's typical baseline.    During this evaluation, patient was limited by chronic LT HP in setting of generalized weakness, impaired activity tolerance, and baseline blindness, all of which has the potential to impact patient's and/or caregivers' safety and independence during functional mobility, as well as performance for ADLs.    Patient lives with her son who works days and is able to provide PRN supervision and assistance. Pt with daily HH Aide in evening who assists with bathing and IADLs.  Patient demonstrates fair to good rehab potential, and should benefit from continued skilled occupational therapy services while in acute care to maximize safety, independence and quality of life at home.  Although pt is near baseline, continued occupational therapy services are recommended to assist pt with strengthening and home readiness since pt is alone a few hours each day.  ?      If plan is discharge home, recommend the following:   A little help with bathing/dressing/bathroom;A little help with walking and/or transfers;Assistance with cooking/housework;Assist for transportation;Direct supervision/assist for financial management;Direct supervision/assist for medications  management     Functional Status Assessment   Patient has had a recent decline in their functional status and demonstrates the ability to make significant improvements in function in a reasonable and predictable amount of time.     Equipment Recommendations    (TBD)     Recommendations for Other Services         Precautions/Restrictions   Precautions Precautions: Fall Precaution/Restrictions Comments: blind, uses white stick for mobility     Mobility Bed Mobility Overal bed mobility: Needs Assistance Bed Mobility: Supine to Sit, Sit to Supine     Supine to sit: Supervision Sit to supine: Supervision        Transfers                          Balance Overall balance assessment: Independent, History of Falls (Pt reports 1 other fall in past 6 months) Sitting-balance support: Feet supported Sitting balance-Leahy Scale: Good     Standing balance support: During functional activity, Single extremity supported, Reliant on assistive device for balance Standing balance-Leahy Scale: Fair                             ADL either performed or assessed with clinical judgement   ADL Overall ADL's : Needs assistance/impaired (Close to baseline since CVA in 2017) Eating/Feeding: Minimal assistance;Sitting Eating/Feeding Details (indicate cue type and reason): Min As for pre-cut food. Grooming: Wash/dry hands;Wash/dry face;Sitting;Set up   Upper Body Bathing: Sitting;Set up   Lower Body Bathing: Contact guard assist;Sitting/lateral leans;Sit to/from stand     Upper Body Dressing Details (indicate cue type and reason): Setup for overhead. Pt would require  baseline assist with buttons/fasteners. Lower Body Dressing: Contact guard assist;Set up;Sitting/lateral leans;Sit to/from stand Lower Body Dressing Details (indicate cue type and reason): Assist for buttons\/fasteners. Setup for slip on shoes. CGA for standing dressing. Toilet Transfer: Biochemist, clinical Details (indicate cue type and reason): Pt performed stand pivot from EOB to recliner with tactile/vrbal cue of PT's LT hand placed on arm of chair. Pt then able to perform transtion with SBA. Chair back to bed with CGA. Toileting- Clothing Manipulation and Hygiene: Contact guard assist       Functional mobility during ADLs: Contact guard assist;Supervision/safety       Vision Baseline Vision/History: 2 Legally blind Ability to See in Adequate Light: 4 Severely impaired Patient Visual Report: No change from baseline Additional Comments: Pt is fully blind at baseline. Pt reports seeing shapes, shadows and some colors.     Perception         Praxis         Pertinent Vitals/Pain Pain Assessment Pain Assessment: No/denies pain (does have h/o back pain but no pain during visit)     Extremity/Trunk Assessment Upper Extremity Assessment Upper Extremity Assessment: Right hand dominant;LUE deficits/detail LUE Deficits / Details: Residual weakness from past CVA. Grossly 4-/5 throughout. Pt reports decreased dexterity and sensation to LT hand with needs of son to pre-cut her food. LUE Sensation: decreased light touch (Proprioception and kinesthesia intact.)   Lower Extremity Assessment Lower Extremity Assessment: Overall WFL for tasks assessed (Pt does report that her LT leg "slows me down.")   Cervical / Trunk Assessment Cervical / Trunk Assessment: Normal   Communication Communication Communication: No apparent difficulties   Cognition Arousal: Alert Behavior During Therapy: WFL for tasks assessed/performed Cognition: No apparent impairments                               Following commands: Intact       Cueing  General Comments   Cueing Techniques: Verbal cues  Pt completed 30 sec Sit to stand chair test, modified for use of UEs on arms of chair due to blindness. Pt completed 6 reps with no more than SBA, indicating elevated  fall risk, however pt reports this is due to her baseline LT HP.   Exercises     Shoulder Instructions      Home Living Family/patient expects to be discharged to:: Private residence Living Arrangements: Children Available Help at Discharge: Family;Available PRN/intermittently Type of Home: House Home Access: Stairs to enter [employer] of Steps: 2 Entrance Stairs-Rails: Right;Left Home Layout: One level     Bathroom Shower/Tub: Chief Strategy Officer: Standard Bathroom Accessibility: Yes   Home Equipment: Agricultural consultant (2 wheels);Cane - single point;Shower seat;BSC/3in1;Wheelchair - manual;Toilet riser;Other (comment);Hand held shower head   Additional Comments: son works during the day, has a home aid 4 hours every day in the afternoon/evening (3pm-7pm). hleps with bathing and meal prep.      Prior Functioning/Environment Prior Level of Function : Independent/Modified Independent             Mobility Comments: Uses white sight cane for community mobility ADLs Comments: has aide 4 hours/day to assist with home tasks as needed.    OT Problem List: Impaired vision/perception;Impaired balance (sitting and/or standing);Decreased activity tolerance;Impaired UE functional use;Obesity   OT Treatment/Interventions: Self-care/ADL training;Therapeutic exercise;Neuromuscular education;Patient/family education;Balance training      OT Goals(Current goals can be found in the  care plan section)   Acute Rehab OT Goals Patient Stated Goal: To continue strengthening L hemibody. OT Goal Formulation: With patient Time For Goal Achievement: 02/05/24 Potential to Achieve Goals: Fair ADL Goals Pt Will Perform Grooming: standing;with supervision (Supervision due to unfamilar environment with blindness) Pt Will Transfer to Toilet: ambulating;with supervision (Using white vision stick and cues for env) Pt Will Perform Toileting - Clothing Manipulation and  hygiene: with modified independence Pt Will Perform Tub/Shower Transfer: with contact guard assist;with caregiver independent in assisting;ambulating Additional ADL Goal #1: Patient will identify at least 3 fall prevention strategies to employ at home in order to maximize function and safety during ADLs and decrease caregiver burden while preventing possible injury and rehospitalization.   OT Frequency:  Min 2X/week    Co-evaluation              AM-PAC OT "6 Clicks" Daily Activity     Outcome Measure Help from another person eating meals?: A Little Help from another person taking care of personal grooming?: A Little Help from another person toileting, which includes using toliet, bedpan, or urinal?: A Little Help from another person bathing (including washing, rinsing, drying)?: A Little Help from another person to put on and taking off regular upper body clothing?: A Little Help from another person to put on and taking off regular lower body clothing?: A Little 6 Click Score: 18   End of Session    Activity Tolerance: Patient tolerated treatment well Patient left: in bed;with call bell/phone within reach;with bed alarm set  OT Visit Diagnosis: Unsteadiness on feet (R26.81);Dizziness and giddiness (R42);History of falling (Z91.81);Low vision, both eyes (H54.2)                Time: 1610-9604 OT Time Calculation (min): 28 min Charges:  OT General Charges $OT Visit: 1 Visit OT Evaluation $OT Eval Low Complexity: 1 Low OT Treatments $Therapeutic Activity: 8-22 mins  Bridgette [facility], OT Acute Rehab Services Office: [phone] 01/22/2024   Asher Blade 01/22/2024, 1:28 PM

## 2024-01-22 NOTE — Assessment & Plan Note (Addendum)
 Unprovoked syncopal event with prior similar approximately 8 months ago.  History is concerning for arrhythmia.  Orthostatic vitals positive given increase in HR.  TSH WNL. - PT/OT eval and treat - Fall precautions - Cardiac monitoring changed to continuous - Echocardiogram scheduled for 11 AM - Consider discharge with Holter monitor/Zio patch - AM BMP, Mag - Consult to Cardiology, follow up recs

## 2024-01-22 NOTE — Assessment & Plan Note (Addendum)
 Microcytic anemia with elevated RDW, at baseline Hgb 10-11.

## 2024-01-23 ENCOUNTER — Encounter (HOSPITAL_COMMUNITY): Payer: Self-pay | Admitting: Family Medicine

## 2024-01-23 ENCOUNTER — Other Ambulatory Visit: Payer: Self-pay

## 2024-01-23 DIAGNOSIS — R55 Syncope and collapse: Secondary | ICD-10-CM | POA: Diagnosis not present

## 2024-01-23 DIAGNOSIS — I429 Cardiomyopathy, unspecified: Secondary | ICD-10-CM | POA: Diagnosis not present

## 2024-01-23 LAB — BASIC METABOLIC PANEL WITH GFR
Anion gap: 7 (ref 5–15)
BUN: 7 mg/dL (ref 6–20)
CO2: 25 mmol/L (ref 22–32)
Calcium: 9.2 mg/dL (ref 8.9–10.3)
Chloride: 106 mmol/L (ref 98–111)
Creatinine, Ser: 0.89 mg/dL (ref 0.44–1.00)
GFR, Estimated: >60 mL/min (ref >60)
Glucose, Bld: 79 mg/dL (ref 70–99)
Potassium: 4.6 mmol/L (ref 3.5–5.1)
Sodium: 138 mmol/L (ref 135–145)

## 2024-01-23 LAB — MAGNESIUM: Magnesium: 2 mg/dL (ref 1.7–2.4)

## 2024-01-23 NOTE — Assessment & Plan Note (Deleted)
 Microcytic anemia with elevated RDW, at baseline Hgb 10-11.

## 2024-01-23 NOTE — Plan of Care (Signed)
    Problem: Clinical Measurements: Goal: Will remain free from infection Outcome: Progressing Goal: Respiratory complications will improve Outcome: Progressing Goal: Cardiovascular complication will be avoided Outcome: Progressing   Problem: Nutrition: Goal: Adequate nutrition will be maintained Outcome: Progressing   Problem: Health Behavior/Discharge Planning: Goal: Ability to manage health-related needs will improve Outcome: Not Progressing   Problem: Clinical Measurements: Goal: Ability to maintain clinical measurements within normal limits will improve Outcome: Not Progressing Goal: Diagnostic test results will improve Outcome: Not Progressing   Problem: Activity: Goal: Risk for activity intolerance will decrease Outcome: Not Progressing  Ate more today. Stools have decreased. Blood transfusion at present. Resistant to care.

## 2024-01-23 NOTE — Assessment & Plan Note (Deleted)
 Unprovoked syncopal event with prior similar approximately 8 months ago.  History is concerning for arrhythmia.  Orthostatic vitals positive given increase in HR.  TSH WNL. - PT/OT eval and treat - Fall precautions - Cardiac monitoring changed to continuous - Echocardiogram scheduled for 11 AM - Consider discharge with Holter monitor/Zio patch - AM BMP, Mag - Consult to Cardiology, follow up recs

## 2024-01-23 NOTE — Discharge Instructions (Addendum)
 Dear Andrea Berry,   Thank you so much for allowing us  to be part of your care!  You were admitted to [facility] for loss of consciousness.  We observed you and did some tests, which showed your heart rate spikes when you stand up but otherwise we did not find anything concerning.  Your Cardiologist evaluated you and recommended a heart monitor outpatient over a month to observe for signs of abnormal heart rhythms.  Cardiology will work with you to set that up.  DISCHARGE MEDICATION CHANGES Please see attached packet  POST-HOSPITAL & CARE INSTRUCTIONS Please let PCP/Specialists know of any changes that were made.  Please see medications section of this packet for any medication changes.   DOCTOR'S APPOINTMENT & FOLLOW UP CARE INSTRUCTIONS  Future Appointments  Date Time Provider Department Center  01/28/2024  1:30 PM Clem Currier, DO [facility]  02/05/2024 10:00 AM [facility] LAB [facility]  03/06/2024 10:00 AM [facility] LAB [facility]  04/02/2024  9:10 AM [facility] ANNUAL WELLNESS VISIT [facility]  04/06/2024 10:00 AM [facility] LAB [facility]  04/28/2024  9:00 AM Chandrasekhar, Caretha Chapel, MD [facility]  04/28/2024 10:00 AM Ricarda Challenger, PhD [facility]   RETURN PRECAUTIONS: Please return if you lose consciousness again or feel like you are close to collapse  Take care and be well!  Family Medicine Teaching Service Inpatient Team [facility]  [address] [place], [place] [address] [phone]

## 2024-01-23 NOTE — Discharge Summary (Signed)
 Family Medicine Teaching [facility] Discharge Summary  Patient name: Andrea Berry Medical record number: [id number] Date of birth: 08/27/1966 Age: 58 y.o. Gender: female Date of Admission: 01/21/2024  Date of Discharge: 01/23/2024 Admitting Physician: Candee Cha, MD  Primary Care Provider: McDiarmid, Demetra Filter, MD Consultants: Cardiology  Indication for Hospitalization: Syncope  Discharge Diagnoses/Problem List:  Principal Problem for Admission: Syncope Other Problems addressed during stay:  Active Problems:   Anemia   Syncope   Chronic health problem   Brief Hospital Course:  Andrea Berry is a 58 y.o. year old with a history of HTN, PE, SLE, GERD, ILD, h/o CVA with residual left sided deficits, neuromyelitis optica with blindness, HTN, hypertrophic cardiomyopathy (ECHO Aug 2023 EF 60-65% moderate septal hypertrophy) who presented from home with unprovoked syncope lasting several seconds and was admitted to the [facility] Medicine Teaching service for workup.  Hospital course is below.  Syncope and collapse No concern for seizure on history and returned to baseline shortly after episode.  In the ED, labs reveal very mild anemia (hemoglobin 11.4), CMP unremarkable, troponins negative.  CXR unremarkable.  Neurological exam was normal.  Patient was somewhat hypotensive, though this improved after IV fluids.  On admission she underwent echocardiogram which showed EF 70-75% and some hyperdynamic function of LV w/out WMA.  Orthostatic vitals were positive.  Patient's outpatient Cardiologist Dr. Glena Landau was consulted given repeat syncope and history of HOCM.  After evaluating patient, Dr. Glena Landau agreed with current workup with plan for outpatient cardiac event monitor and possible increase of diltiazem .  Etiology of syncope remains unclear, though cardiac causes remain a consideration.  Discharged with HHPT.  Patient will follow up at [facility] on 5/20.  Other chronic conditions  were medically managed with home medications and formulary alternatives as necessary (HTN, Anxiety, h/o PE, complex chronic pain, HLD, GERD, SLE).  PCP Follow-up Recommendations: Plan for Zio patch outpatient per Cardiology, please ensure outpatient follow up with Dr. Glena Landau and/or receipt of cardiac event monitor Follow up positive orthostatic vitals, no changes made inpatient to medication regimen from that standpoint Recommend consideration of weaning lorazepam  0.5 mg BID or converting to longer acting benzodiazepine, in particular in setting of polypharmacy with gabapentin , tizanidine  (not continued), and trazodone   Disposition: Home with HHPT  Discharge Condition: Stable  Discharge Exam:  Vitals:   01/23/24 1043 01/23/24 1101  BP: 132/65 132/65  Pulse: 84   Resp:    Temp:    SpO2: 100%    Physical Exam: General: Appears stated age, resting comfortably in bed, NAD.  Mildly deconditioned. Eyes: No scleral injections or icterus. ENTM: MMM.  Blind. Neck: No JVD. Cardiovascular: RRR.  Normal S1/2.  No murmur/rub/gallops. Respiratory: CTAB.  Normal WOB on room air. Gastrointestinal: No tenderness palpation all quadrants. MSK: Full range of motion all extremities. Extremities: No peripheral edema bilaterally. Derm: No rashes grossly. Neuro: Alert and oriented x4. Psych: Full range affect.  Pleasant, conversational.  Significant Procedures: None  Significant Labs and Imaging:  Recent Labs  Lab 01/21/24 2120 01/22/24 0358  WBC 9.3 6.7  HGB 11.4* 10.6*  HCT 38.3 36.4  PLT 276 337   Recent Labs  Lab 01/21/24 2238 01/22/24 0358 01/23/24 0459  NA 140 140 138  K 4.1 4.2 4.6  CL 107 106 106  CO2 23 23 25   GLUCOSE 137* 88 79  BUN 8 8 7   CREATININE 1.03* 0.86 0.89  CALCIUM  9.6 9.4 9.2  MG 1.9  --  2.0  ALKPHOS 68  --   --   AST 19  --   --   ALT 13  --   --   ALBUMIN  3.4*  --   --    - TSH WNL - Trop 10>11  - TTE: EF 70-75%, LV hyperdynamic function w/out  WMA. - CXR: Unremarkable  Results/Tests Pending at Time of Discharge: Unresulted Labs (From admission, onward)     Start     Ordered   01/21/24 2149  Urinalysis, w/ Reflex to Culture (Infection Suspected) -Urine, Clean Catch  Once,   URGENT       Question:  Specimen Source  Answer:  Urine, Clean Catch   01/21/24 2149            Discharge Medications:  Allergies as of 01/23/2024       Reactions   Beta Adrenergic Blockers Other (See Comments)   Diagnosis of hypertrophic cardiomyopathy   Rocephin  [ceftriaxone ] Other (See Comments)   Multiorgan inflammation with drug fever and multiple elevated inflammatory markers.  Resolved upon cessation of abx.        Medication List     STOP taking these medications    baclofen  10 MG tablet Commonly known as: LIORESAL    tiZANidine  4 MG tablet Commonly known as: ZANAFLEX    traMADol  50 MG tablet Commonly known as: ULTRAM        TAKE these medications    albuterol  108 (90 Base) MCG/ACT inhaler Commonly known as: Ventolin  HFA Inhale 2 puffs into the lungs every 6 (six) hours as needed for wheezing or shortness of breath.   alendronate  70 MG tablet Commonly known as: FOSAMAX  Take 1 tablet (70 mg total) by mouth every Friday.   Altamist Spray 0.65 % nasal spray Generic drug: sodium chloride  Place 2 sprays into the nose 2 (two) times daily.   amitriptyline  10 MG tablet Commonly known as: ELAVIL  Take 1 tablet (10 mg total) by mouth daily as needed (Headache).   atorvastatin  40 MG tablet Commonly known as: LIPITOR Take 1 tablet (40 mg total) by mouth daily.   azithromycin  250 MG tablet Commonly known as: ZITHROMAX  Take 1 tablet by mouth daily.   diltiazem  180 MG 24 hr capsule Commonly known as: CARDIZEM  CD Take 1 capsule (180 mg total) by mouth daily.   DULoxetine  60 MG capsule Commonly known as: CYMBALTA  TAKE 1 CAPSULE BY MOUTH EVERY DAY What changed: Another medication with the same name was removed. Continue  taking this medication, and follow the directions you see here.   esomeprazole  20 MG capsule Commonly known as: NEXIUM  Take 1 capsule (20 mg total) by mouth daily at 12 noon.   gabapentin  300 MG capsule Commonly known as: NEURONTIN  Take 2 capsules (600 mg total) by mouth 2 (two) times daily.   linaclotide  145 MCG Caps capsule Commonly known as: Linzess  Take 1 capsule (145 mcg total) by mouth daily before breakfast. What changed:  when to take this reasons to take this   LORazepam  0.5 MG tablet Commonly known as: ATIVAN  Take 1 tablet (0.5 mg total) by mouth 2 (two) times daily.   ondansetron  4 MG disintegrating tablet Commonly known as: ZOFRAN -ODT Take 1 tablet by mouth every 8 hours as needed for nausea and vomiting   OXcarbazepine  150 MG tablet Commonly known as: TRILEPTAL  Take 300 mg by mouth 2 (two) times daily. What changed: Another medication with the same name was removed. Continue taking this medication, and follow the directions you see here.   Pirfenidone 267  MG Tabs Take 3 tablets by mouth in the morning, at noon, and at bedtime.   predniSONE  10 MG tablet Commonly known as: DELTASONE  Take 0.5 tablets (5 mg total) by mouth daily. What changed: Another medication with the same name was removed. Continue taking this medication, and follow the directions you see here.   RITUXAN IV Inject 1,000 mg into the vein See admin instructions. 1,000 mg into the vein every 6 months, then two weeks later another 1,000 mg injection   tiotropium 18 MCG inhalation capsule Commonly known as: Spiriva  HandiHaler Place 1 capsule (18 mcg total) into inhaler and inhale daily.   traZODone  50 MG tablet Commonly known as: DESYREL  Take 0.5-1 tablets (25-50 mg total) by mouth at bedtime as needed for sleep.   Vitamin D3 50 MCG (2000 UT) Tabs Take 2 tablets by mouth daily.   Xarelto  10 MG Tabs tablet Generic drug: rivaroxaban  Take 1 tablet (10 mg total) by mouth daily.         Discharge Instructions: Please refer to Patient Instructions section of EMR for full details.  Patient was counseled important signs and symptoms that should prompt return to medical care, changes in medications, dietary instructions, activity restrictions, and follow up appointments.   Follow-Up Appointments: Future Appointments  Date Time Provider Department Center  01/28/2024  1:30 PM Clem Currier, DO [facility]  02/05/2024 10:00 AM [facility] LAB [facility]  03/06/2024 10:00 AM [facility] LAB [facility]  04/02/2024  9:10 AM [facility] ANNUAL WELLNESS VISIT [facility]  04/06/2024 10:00 AM [facility] LAB [facility]  04/28/2024  9:00 AM Paulita Boss, Caretha Chapel, MD [facility]  04/28/2024 10:00 AM Ricarda Challenger, PhD [facility]    [place], Hiroshi Krummel, MD 01/23/2024, 12:16 PM PGY-1, [facility]

## 2024-01-23 NOTE — TOC CM/SW Note (Addendum)
 Transition of Care [facility]) - Inpatient Brief Assessment   Patient Details  Name: Andrea Berry MRN: [id number] Date of Birth: 1966-02-14  Transition of Care [facility]) CM/SW Contact:    Tom-Johnson, Angelique Ken, RN Phone Number: 01/23/2024, 10:53 AM   Clinical Narrative:  Patient is scheduled for discharge today.  Readmission Risk Assessment done. Home health recommended, patient has no preference. CM called in referral to [facility] and Loetta Ringer voiced acceptance, info on AVS. Outpatient f/u, hospital f/u and discharge instructions on AVS. Nephew, Landon Pinion to transport at discharge.  No further TOC needs noted.         Transition of Care Asessment: Insurance and Status: Insurance coverage has been reviewed Patient has primary care physician: Yes Home environment has been reviewed: Yes Prior level of function:: Modified Indeoendent, Legally Blind. Prior/Current Home Services: No current home services Social Drivers of Health Review: SDOH reviewed no interventions necessary Readmission risk has been reviewed: Yes Transition of care needs: transition of care needs identified, TOC will continue to follow

## 2024-01-23 NOTE — Assessment & Plan Note (Deleted)
 HTN: continue home Diltiazem  180 mg daily, monitor BPs Anxiety: Holding home Ativan  due to patient reporting taking every other day, can add back as needed History of PE: Continue on Xarelto , monitor for symptoms of tachycardia/SOB Complex Chronic Pain: Continue home duloxetine  60 mg daily, holding home gabapentin  600 mg BID, add back as needed HLD: Continue home atorvastatin  40 mg daily GERD: Continue formulary equivalent esomeprazole  20 mg (pantoprazole  40 mg) daily SLE: On Rituximab 1000 mg outpatient every 6 months ILD: Prednisone  5 mg daily, family will bring Pirfenidone 267 mg TID from home (not on formulary)

## 2024-01-23 NOTE — Progress Notes (Signed)
 Went over discharge paperwork with pt, all questions were answered. Pt left with all personal belongings and paperwork

## 2024-01-24 ENCOUNTER — Other Ambulatory Visit (HOSPITAL_COMMUNITY): Payer: Self-pay

## 2024-01-24 ENCOUNTER — Other Ambulatory Visit: Payer: Self-pay

## 2024-01-24 HISTORY — PX: US TRANSRECTAL LIMITED: HXRAD576

## 2024-01-24 HISTORY — PX: EUS: SHX5427

## 2024-01-27 ENCOUNTER — Other Ambulatory Visit: Payer: Self-pay

## 2024-01-27 ENCOUNTER — Other Ambulatory Visit: Payer: Self-pay | Admitting: Family Medicine

## 2024-01-27 ENCOUNTER — Other Ambulatory Visit (HOSPITAL_COMMUNITY): Payer: Self-pay

## 2024-01-27 DIAGNOSIS — F19982 Other psychoactive substance use, unspecified with psychoactive substance-induced sleep disorder: Secondary | ICD-10-CM

## 2024-01-27 DIAGNOSIS — G894 Chronic pain syndrome: Secondary | ICD-10-CM

## 2024-01-27 DIAGNOSIS — G8929 Other chronic pain: Secondary | ICD-10-CM

## 2024-01-27 DIAGNOSIS — K219 Gastro-esophageal reflux disease without esophagitis: Secondary | ICD-10-CM

## 2024-01-27 DIAGNOSIS — R0609 Other forms of dyspnea: Secondary | ICD-10-CM

## 2024-01-27 DIAGNOSIS — F418 Other specified anxiety disorders: Secondary | ICD-10-CM

## 2024-01-27 DIAGNOSIS — R519 Headache, unspecified: Secondary | ICD-10-CM

## 2024-01-27 DIAGNOSIS — I422 Other hypertrophic cardiomyopathy: Secondary | ICD-10-CM

## 2024-01-27 DIAGNOSIS — J42 Unspecified chronic bronchitis: Secondary | ICD-10-CM

## 2024-01-27 DIAGNOSIS — M7918 Myalgia, other site: Secondary | ICD-10-CM

## 2024-01-27 DIAGNOSIS — E559 Vitamin D deficiency, unspecified: Secondary | ICD-10-CM

## 2024-01-27 DIAGNOSIS — G8194 Hemiplegia, unspecified affecting left nondominant side: Secondary | ICD-10-CM

## 2024-01-27 DIAGNOSIS — E2749 Other adrenocortical insufficiency: Secondary | ICD-10-CM

## 2024-01-27 DIAGNOSIS — F419 Anxiety disorder, unspecified: Secondary | ICD-10-CM

## 2024-01-27 MED ORDER — GABAPENTIN 300 MG PO CAPS
600.0000 mg | ORAL_CAPSULE | Freq: Two times a day (BID) | ORAL | 3 refills | Status: AC
  Filled 2024-01-27: qty 120, 30d supply, fill #0
  Filled 2024-01-30: qty 360, 90d supply, fill #0
  Filled 2024-02-18 (×2): qty 120, 30d supply, fill #0
  Filled 2024-03-09 – 2024-03-23 (×3): qty 120, 30d supply, fill #1
  Filled 2024-04-06 – 2024-04-16 (×2): qty 120, 30d supply, fill #2
  Filled 2024-05-13 (×2): qty 120, 30d supply, fill #3
  Filled 2024-06-10 – 2024-06-16 (×2): qty 120, 30d supply, fill #4
  Filled 2024-07-20 (×2): qty 120, 30d supply, fill #5
  Filled 2024-08-12: qty 120, 30d supply, fill #6
  Filled 2024-09-15: qty 120, 30d supply, fill #7
  Filled 2024-10-14: qty 120, 30d supply, fill #8

## 2024-01-28 ENCOUNTER — Ambulatory Visit (INDEPENDENT_AMBULATORY_CARE_PROVIDER_SITE_OTHER): Admitting: Student

## 2024-01-28 ENCOUNTER — Other Ambulatory Visit: Payer: Self-pay

## 2024-01-28 ENCOUNTER — Encounter (HOSPITAL_COMMUNITY): Payer: Self-pay

## 2024-01-28 ENCOUNTER — Ambulatory Visit
Admission: RE | Admit: 2024-01-28 | Discharge: 2024-01-28 | Disposition: A | Discharge status: Home / Self Care | Source: Ambulatory Visit | Attending: Family Medicine | Admitting: Family Medicine

## 2024-01-28 ENCOUNTER — Other Ambulatory Visit (HOSPITAL_COMMUNITY): Payer: Self-pay

## 2024-01-28 VITALS — BP 124/80 | HR 84 | Ht 61.0 in | Wt 171.6 lb

## 2024-01-28 DIAGNOSIS — M25511 Pain in right shoulder: Secondary | ICD-10-CM | POA: Diagnosis not present

## 2024-01-28 DIAGNOSIS — M25512 Pain in left shoulder: Secondary | ICD-10-CM | POA: Diagnosis not present

## 2024-01-28 DIAGNOSIS — W19XXXA Unspecified fall, initial encounter: Secondary | ICD-10-CM

## 2024-01-28 MED ORDER — AZITHROMYCIN 250 MG PO TABS
250.0000 mg | ORAL_TABLET | Freq: Every day | ORAL | 1 refills | Status: DC
  Filled 2024-01-28: qty 30, 30d supply, fill #0
  Filled 2024-03-04: qty 21, 21d supply, fill #1
  Filled 2024-03-09 – 2024-03-11 (×3): qty 21, 21d supply, fill #2
  Filled 2024-03-23: qty 30, 30d supply, fill #2
  Filled 2024-04-06 – 2024-04-16 (×2): qty 30, 30d supply, fill #3

## 2024-01-28 MED ORDER — OXYCODONE HCL 5 MG PO TABS
2.5000 mg | ORAL_TABLET | ORAL | 0 refills | Status: DC | PRN
  Filled 2024-01-28 (×3): qty 10, 4d supply, fill #0

## 2024-01-28 MED ORDER — MELOXICAM 7.5 MG PO TABS
7.5000 mg | ORAL_TABLET | Freq: Every day | ORAL | 0 refills | Status: DC
  Filled 2024-01-28: qty 30, 30d supply, fill #0

## 2024-01-28 MED ORDER — KETOROLAC TROMETHAMINE 15 MG/ML IJ SOLN
15.0000 mg | Freq: Once | INTRAMUSCULAR | Status: AC
  Administered 2024-01-28: 15 mg via INTRAMUSCULAR

## 2024-01-28 NOTE — Addendum Note (Signed)
 Addended by: Jerricka Carvey on: 01/28/2024 02:40 PM   Modules accepted: Orders

## 2024-01-28 NOTE — Progress Notes (Addendum)
    SUBJECTIVE:   CHIEF COMPLAINT / HPI:   Andrea Berry is a 58 y.o. female presenting for hospital follow-up.  Patient was recently hospitalized at [facility] for syncopal episode.  She received an extensive cardiac workup which was all negative.  She is scheduled to get a Zio patch outpatient she has not yet received this in the mail.  She reports she has not had any repeat syncopal episodes.  Unfortunately she had a recent fall last night if she tripped over her shoe.  Patient is legally blind and is prone to fall.  She reports she fell onto her left shoulder and now has pain.  She has difficulty with range of motion.  Her grip strength is intact however she is struggling to raise her arm above her shoulder.  PERTINENT  PMH / PSH: reviewed and updated.  OBJECTIVE:   BP 124/80   Pulse 84   Ht 5\' 1"  (1.549 m)   Wt 171 lb 9.6 oz (77.8 kg)   SpO2 98%   BMI 32.42 kg/m   Well-appearing, no acute distress Cardio: Regular rate, regular rhythm, no murmurs on exam. Pulm: Clear, no wheezing, no crackles. No increased work of breathing Abdominal: bowel sounds present, soft, non-tender, non-distended Extremities:  No obvious shoulder deformity Tenderness to palpation over the medial and lateral joint lines Decreased range of motion with flexion and extension Patient unable to tolerate strength testing secondary to pain Grip strength 5 out of 5 and equal to right side Radial pulse +2  ASSESSMENT/PLAN:   Assessment & Plan Acute pain of right shoulder Concern for possible fracture.  Reviewed patient's kidney function which was in normal limits and will give her a shot of 15 mg Toradol  today.  Will also order x-rays of the left shoulder, if these are abnormal we will send referral to sports medicine or orthopedics For pain, discussed meloxicam but with patient being on Xarelto  she would not be a candidate.  Will send in a short course of oxycodone  2.5 mg to be used as needed for pain.   Fall, initial encounter Does not seem to be related to syncopal episode as patient had a trip over her shoes and is legally blind.  Will await Zio patch outpatient.  Patient does have a son at home who can help replace the patch.     Clem Currier, DO [facility]

## 2024-01-28 NOTE — Patient Instructions (Signed)
 I have ordered a shoulder x-ray you can get this across [facility] at the following address:  [facility] [address]  [phone] You do not need an appointment.

## 2024-01-28 NOTE — Addendum Note (Signed)
 Addended by: Malic Rosten on: 01/28/2024 02:27 PM   Modules accepted: Orders

## 2024-01-29 ENCOUNTER — Other Ambulatory Visit (HOSPITAL_BASED_OUTPATIENT_CLINIC_OR_DEPARTMENT_OTHER): Payer: Self-pay

## 2024-01-29 ENCOUNTER — Other Ambulatory Visit: Payer: Self-pay | Admitting: Family Medicine

## 2024-01-29 ENCOUNTER — Ambulatory Visit: Payer: Self-pay | Admitting: Student

## 2024-01-29 NOTE — Telephone Encounter (Signed)
 Please ask Andrea Berry if she wants Dr Porshea Janowski to send medication refills to [facility] or to [employer] [facility].

## 2024-01-29 NOTE — Telephone Encounter (Signed)
 Patient stated that she will like her refills to go to [facility].

## 2024-01-29 NOTE — Telephone Encounter (Signed)
 Called patient to discuss normal shoulder x-ray. She is doing well on the oxycodone . Follow up in 4 weeks.   Clem Currier, DO [facility], PGY-2 01/29/24 4:21 PM

## 2024-01-29 NOTE — Progress Notes (Unsigned)
 Andrea Berry is {Pc accompanied by:5710} Sources of clinical information for visit is/are {Information source:60032}. Nursing assessment for this office visit was reviewed with the patient for accuracy and revision.     Previous Report(s) Reviewed: {Outside review:15817}     01/28/2024    1:20 PM  Depression screen PHQ 2/9  Decreased Interest 0  Down, Depressed, Hopeless 0  PHQ - 2 Score 0  Altered sleeping 0  Tired, decreased energy 0  Change in appetite 0  Feeling bad or failure about yourself  0  Trouble concentrating 0  Moving slowly or fidgety/restless 0  Suicidal thoughts 0  PHQ-9 Score 0   Flowsheet Row Office Visit from 01/28/2024 in [facility] [place] PULMON[facility] REH[facility] OTHER RESPIRATORY from 12/19/2023 in [facility] Office Visit from 12/05/2023 in [facility]  Thoughts that you would be better off dead, or of hurting yourself in some way Not at all Not at all Not at all  PHQ-9 Total Score 0 0 2          01/28/2024    1:21 PM 12/24/2023   10:34 AM 12/19/2023   10:29 AM 12/17/2023   11:09 AM 12/12/2023   10:29 AM  Fall Risk   Falls in the past year? 1 0 0 0 0  Number falls in past yr:  0 0 0 0  Injury with Fall?  0 0 0 0  Risk for fall due to :  History of fall(s);Impaired balance/gait;Impaired vision History of fall(s);Impaired balance/gait;Impaired vision History of fall(s);Impaired balance/gait;Impaired vision History of fall(s);Impaired balance/gait;Impaired vision  Follow up  Falls evaluation completed;Falls prevention discussed Falls evaluation completed;Falls prevention discussed Falls evaluation completed;Falls prevention discussed Falls evaluation completed       01/28/2024    1:20 PM 12/19/2023   11:57 AM 12/05/2023    9:07 AM  PHQ9 SCORE ONLY  PHQ-9 Total Score 0 0 2    There are no  preventive care reminders to display for this patient.  Health Maintenance Due  Topic Date Due   Medicare Annual Wellness (AWV)  03/28/2024      History/P.E. limitations: {exam; limitations ed:60112}  There are no preventive care reminders to display for this patient. There are no preventive care reminders to display for this patient.  Health Maintenance Due  Topic Date Due   Medicare Annual Wellness (AWV)  03/28/2024     No chief complaint on file.    --------------------------------------------------------------------------------------------------------------------------------------------- Visit Problem List with A/P  No problem-specific Assessment & Plan notes found for this encounter.

## 2024-01-30 ENCOUNTER — Other Ambulatory Visit: Payer: Self-pay

## 2024-01-30 ENCOUNTER — Other Ambulatory Visit: Payer: Self-pay | Admitting: Family Medicine

## 2024-01-30 ENCOUNTER — Other Ambulatory Visit (HOSPITAL_COMMUNITY): Payer: Self-pay

## 2024-01-30 DIAGNOSIS — J849 Interstitial pulmonary disease, unspecified: Secondary | ICD-10-CM

## 2024-01-30 DIAGNOSIS — E2749 Other adrenocortical insufficiency: Secondary | ICD-10-CM

## 2024-01-30 DIAGNOSIS — G360 Neuromyelitis optica [Devic]: Secondary | ICD-10-CM

## 2024-01-30 DIAGNOSIS — M329 Systemic lupus erythematosus, unspecified: Secondary | ICD-10-CM

## 2024-01-30 MED ORDER — ALBUTEROL SULFATE HFA 108 (90 BASE) MCG/ACT IN AERS
2.0000 | INHALATION_SPRAY | Freq: Four times a day (QID) | RESPIRATORY_TRACT | 2 refills | Status: DC | PRN
  Filled 2024-01-30 – 2024-02-19 (×4): qty 6.7, 25d supply, fill #0
  Filled 2024-05-01: qty 6.7, 25d supply, fill #1
  Filled 2024-06-08: qty 6.7, 25d supply, fill #2

## 2024-01-30 MED ORDER — TIOTROPIUM BROMIDE MONOHYDRATE 18 MCG IN CAPS
18.0000 ug | ORAL_CAPSULE | Freq: Every day | RESPIRATORY_TRACT | 3 refills | Status: AC
  Filled 2024-01-30: qty 90, 90d supply, fill #0
  Filled 2024-02-18 (×2): qty 30, 30d supply, fill #0
  Filled 2024-02-19: qty 90, 90d supply, fill #0
  Filled 2024-05-14: qty 90, 90d supply, fill #1
  Filled 2024-05-15: qty 30, 30d supply, fill #1
  Filled 2024-06-10 – 2024-06-18 (×3): qty 30, 30d supply, fill #2

## 2024-01-30 MED ORDER — PREDNISONE 5 MG PO TABS
5.0000 mg | ORAL_TABLET | Freq: Every day | ORAL | 1 refills | Status: DC
Start: 2024-01-30 — End: 2024-05-04
  Filled 2024-01-30 – 2024-02-18 (×3): qty 30, 30d supply, fill #0
  Filled 2024-03-09 – 2024-03-23 (×3): qty 30, 30d supply, fill #1
  Filled 2024-04-06 – 2024-04-16 (×2): qty 30, 30d supply, fill #2

## 2024-01-30 MED ORDER — DULOXETINE HCL 60 MG PO CPEP
60.0000 mg | ORAL_CAPSULE | Freq: Every day | ORAL | 3 refills | Status: AC
  Filled 2024-01-30 – 2024-02-18 (×3): qty 30, 30d supply, fill #0
  Filled 2024-03-09 – 2024-03-23 (×3): qty 30, 30d supply, fill #1
  Filled 2024-04-06 – 2024-04-16 (×2): qty 30, 30d supply, fill #2
  Filled 2024-05-13 (×2): qty 30, 30d supply, fill #3
  Filled 2024-06-10 – 2024-06-16 (×2): qty 30, 30d supply, fill #4
  Filled 2024-07-20 (×2): qty 30, 30d supply, fill #5
  Filled 2024-08-12: qty 30, 30d supply, fill #6
  Filled 2024-09-15: qty 30, 30d supply, fill #7
  Filled 2024-10-14: qty 30, 30d supply, fill #8

## 2024-01-30 MED ORDER — ESOMEPRAZOLE MAGNESIUM 20 MG PO CPDR
20.0000 mg | DELAYED_RELEASE_CAPSULE | Freq: Every day | ORAL | 3 refills | Status: AC
  Filled 2024-01-30 – 2024-02-18 (×3): qty 30, 30d supply, fill #0
  Filled 2024-03-09 – 2024-03-23 (×3): qty 30, 30d supply, fill #1
  Filled 2024-04-06 – 2024-04-16 (×2): qty 30, 30d supply, fill #2
  Filled 2024-05-13 (×2): qty 30, 30d supply, fill #3
  Filled 2024-06-10 – 2024-06-16 (×2): qty 30, 30d supply, fill #4
  Filled 2024-07-20 (×2): qty 30, 30d supply, fill #5
  Filled 2024-08-12: qty 30, 30d supply, fill #6
  Filled 2024-09-15: qty 30, 30d supply, fill #7
  Filled 2024-10-14: qty 30, 30d supply, fill #8

## 2024-01-30 MED ORDER — LINACLOTIDE 145 MCG PO CAPS
145.0000 ug | ORAL_CAPSULE | Freq: Every day | ORAL | 3 refills | Status: AC
  Filled 2024-01-30 – 2024-02-19 (×4): qty 30, 30d supply, fill #0
  Filled 2024-03-09 – 2024-03-23 (×4): qty 30, 30d supply, fill #1
  Filled 2024-04-06 – 2024-04-17 (×3): qty 30, 30d supply, fill #2
  Filled 2024-05-13 – 2024-05-15 (×3): qty 30, 30d supply, fill #3
  Filled 2024-06-10 – 2024-06-18 (×3): qty 30, 30d supply, fill #4
  Filled 2024-07-10 – 2024-07-13 (×2): qty 30, 30d supply, fill #5

## 2024-01-30 MED ORDER — AMITRIPTYLINE HCL 10 MG PO TABS
10.0000 mg | ORAL_TABLET | Freq: Every day | ORAL | 1 refills | Status: DC
  Filled 2024-01-30 – 2024-02-18 (×3): qty 30, 30d supply, fill #0
  Filled 2024-03-09 – 2024-03-23 (×4): qty 30, 30d supply, fill #1
  Filled 2024-04-06 – 2024-04-16 (×2): qty 30, 30d supply, fill #2
  Filled 2024-05-13 (×2): qty 30, 30d supply, fill #3
  Filled 2024-06-10 – 2024-06-16 (×2): qty 30, 30d supply, fill #4
  Filled 2024-07-20 (×2): qty 30, 30d supply, fill #5

## 2024-01-30 MED ORDER — TRAZODONE HCL 50 MG PO TABS
25.0000 mg | ORAL_TABLET | Freq: Every evening | ORAL | 5 refills | Status: AC | PRN
  Filled 2024-01-30 – 2024-02-18 (×2): qty 30, 30d supply, fill #0

## 2024-01-30 MED ORDER — DILTIAZEM HCL ER COATED BEADS 180 MG PO CP24
180.0000 mg | ORAL_CAPSULE | Freq: Every day | ORAL | 3 refills | Status: AC
  Filled 2024-01-30 – 2024-02-18 (×3): qty 30, 30d supply, fill #0
  Filled 2024-03-09 – 2024-03-23 (×3): qty 30, 30d supply, fill #1
  Filled 2024-04-06 – 2024-04-16 (×2): qty 30, 30d supply, fill #2
  Filled 2024-05-13 (×2): qty 30, 30d supply, fill #3
  Filled 2024-06-10 – 2024-06-16 (×2): qty 30, 30d supply, fill #4
  Filled 2024-07-20 (×2): qty 30, 30d supply, fill #5
  Filled 2024-08-12: qty 30, 30d supply, fill #6
  Filled 2024-09-15: qty 30, 30d supply, fill #7
  Filled 2024-10-14: qty 30, 30d supply, fill #8

## 2024-01-31 ENCOUNTER — Other Ambulatory Visit (HOSPITAL_COMMUNITY): Payer: Self-pay

## 2024-02-05 ENCOUNTER — Other Ambulatory Visit

## 2024-02-06 ENCOUNTER — Other Ambulatory Visit

## 2024-02-06 DIAGNOSIS — Z79899 Other long term (current) drug therapy: Secondary | ICD-10-CM

## 2024-02-07 LAB — HEPATIC FUNCTION PANEL
ALT: 11 IU/L (ref 0–32)
AST: 15 IU/L (ref 0–40)
Albumin: 4.2 g/dL (ref 3.8–4.9)
Alkaline Phosphatase: 87 IU/L (ref 44–121)
Bilirubin Total: <0.2 mg/dL (ref 0.0–1.2)
Bilirubin, Direct: 0.09 mg/dL (ref 0.00–0.40)
Total Protein: 6.4 g/dL (ref 6.0–8.5)

## 2024-02-18 ENCOUNTER — Other Ambulatory Visit: Payer: Self-pay

## 2024-02-18 ENCOUNTER — Other Ambulatory Visit (HOSPITAL_COMMUNITY): Payer: Self-pay

## 2024-02-19 ENCOUNTER — Other Ambulatory Visit: Payer: Self-pay

## 2024-02-21 ENCOUNTER — Other Ambulatory Visit: Payer: Self-pay

## 2024-03-04 ENCOUNTER — Other Ambulatory Visit: Payer: Self-pay | Admitting: Family Medicine

## 2024-03-04 ENCOUNTER — Other Ambulatory Visit: Payer: Self-pay

## 2024-03-04 ENCOUNTER — Other Ambulatory Visit (HOSPITAL_COMMUNITY): Payer: Self-pay

## 2024-03-05 ENCOUNTER — Other Ambulatory Visit (HOSPITAL_COMMUNITY): Payer: Self-pay

## 2024-03-05 ENCOUNTER — Other Ambulatory Visit: Payer: Self-pay

## 2024-03-05 MED ORDER — OXCARBAZEPINE 150 MG PO TABS
300.0000 mg | ORAL_TABLET | Freq: Two times a day (BID) | ORAL | 3 refills | Status: AC
  Filled 2024-03-05: qty 360, 90d supply, fill #0
  Filled 2024-03-05: qty 120, 30d supply, fill #0
  Filled 2024-03-09 – 2024-03-28 (×4): qty 120, 30d supply, fill #1
  Filled 2024-04-22: qty 120, 30d supply, fill #2
  Filled 2024-05-13: qty 120, 30d supply, fill #3

## 2024-03-06 ENCOUNTER — Other Ambulatory Visit

## 2024-03-06 DIAGNOSIS — Z79899 Other long term (current) drug therapy: Secondary | ICD-10-CM

## 2024-03-07 LAB — HEPATIC FUNCTION PANEL
ALT: 17 IU/L (ref 0–32)
AST: 21 IU/L (ref 0–40)
Albumin: 4.3 g/dL (ref 3.8–4.9)
Alkaline Phosphatase: 103 IU/L (ref 44–121)
Bilirubin Total: <0.2 mg/dL (ref 0.0–1.2)
Bilirubin, Direct: 0.08 mg/dL (ref 0.00–0.40)
Total Protein: 6.6 g/dL (ref 6.0–8.5)

## 2024-03-09 ENCOUNTER — Other Ambulatory Visit: Payer: Self-pay

## 2024-03-11 ENCOUNTER — Other Ambulatory Visit (HOSPITAL_COMMUNITY): Payer: Self-pay

## 2024-03-11 ENCOUNTER — Other Ambulatory Visit: Payer: Self-pay

## 2024-03-12 ENCOUNTER — Other Ambulatory Visit: Payer: Self-pay

## 2024-03-17 ENCOUNTER — Other Ambulatory Visit: Payer: Self-pay

## 2024-03-23 ENCOUNTER — Other Ambulatory Visit: Payer: Self-pay

## 2024-03-27 ENCOUNTER — Other Ambulatory Visit: Payer: Self-pay

## 2024-04-02 ENCOUNTER — Other Ambulatory Visit: Payer: Self-pay

## 2024-04-06 ENCOUNTER — Other Ambulatory Visit

## 2024-04-06 ENCOUNTER — Other Ambulatory Visit: Payer: Self-pay

## 2024-04-06 DIAGNOSIS — Z79899 Other long term (current) drug therapy: Secondary | ICD-10-CM

## 2024-04-07 LAB — HEPATIC FUNCTION PANEL
ALT: 9 IU/L (ref 0–32)
AST: 15 IU/L (ref 0–40)
Albumin: 4.2 g/dL (ref 3.8–4.9)
Alkaline Phosphatase: 97 IU/L (ref 44–121)
Bilirubin Total: 0.2 mg/dL (ref 0.0–1.2)
Bilirubin, Direct: 0.09 mg/dL (ref 0.00–0.40)
Total Protein: 6.4 g/dL (ref 6.0–8.5)

## 2024-04-16 ENCOUNTER — Other Ambulatory Visit: Payer: Self-pay

## 2024-04-17 ENCOUNTER — Other Ambulatory Visit (HOSPITAL_COMMUNITY): Payer: Self-pay

## 2024-04-17 ENCOUNTER — Other Ambulatory Visit: Payer: Self-pay

## 2024-04-17 ENCOUNTER — Ambulatory Visit (INDEPENDENT_AMBULATORY_CARE_PROVIDER_SITE_OTHER): Admitting: Student

## 2024-04-17 ENCOUNTER — Encounter: Payer: Self-pay | Admitting: Student

## 2024-04-17 VITALS — BP 125/65 | HR 98 | Wt 170.0 lb

## 2024-04-17 DIAGNOSIS — R300 Dysuria: Secondary | ICD-10-CM

## 2024-04-17 LAB — POCT URINALYSIS DIP (MANUAL ENTRY)
Bilirubin, UA: NEGATIVE
Glucose, UA: NEGATIVE mg/dL
Ketones, POC UA: NEGATIVE mg/dL
Nitrite, UA: POSITIVE — AB
Protein Ur, POC: 30 mg/dL — AB
Spec Grav, UA: 1.025 (ref 1.010–1.025)
Urobilinogen, UA: 0.2 U/dL
pH, UA: 5.5 (ref 5.0–8.0)

## 2024-04-17 LAB — POCT UA - MICROSCOPIC ONLY: WBC, Ur, HPF, POC: >20 (ref 0–5)

## 2024-04-17 MED ORDER — NITROFURANTOIN MONOHYD MACRO 100 MG PO CAPS
100.0000 mg | ORAL_CAPSULE | Freq: Two times a day (BID) | ORAL | 0 refills | Status: AC
  Filled 2024-04-17 – 2024-04-20 (×2): qty 10, 5d supply, fill #0

## 2024-04-17 NOTE — Patient Instructions (Signed)
 Pleasure to meet you today.  Today we collected your urine sample to test for UTI.  Will call you about the results and follow-up plan.

## 2024-04-17 NOTE — Progress Notes (Signed)
    SUBJECTIVE:   CHIEF COMPLAINT / HPI:   58 y.o.  year old female presents with complaint of irritation with voiding that started 6 days ago.  She also endorses increased frequency and urgency.  She is sexually active with 1 partner and inconsistent with barrier protection. Denies vaginal discharge, itchiness or odor.  No hematuria or recent change in medication.    PERTINENT  PMH / PSH: Reviewed  OBJECTIVE:   BP 125/65   Pulse 98   Wt 170 lb (77.1 kg)   SpO2 99%   BMI 32.12 kg/m    Physical Exam General: Alert, well appearing, NAD Cardiovascular: RRR, No Murmurs, Normal S2/S2 Respiratory: CTAB, No wheezing or Rales Abdomen: No distension or tenderness  ASSESSMENT/PLAN:   UTI Patient complaining of dysuria and increased urinary frequency in the last 6 days with suprapubic pain.  UA was positive confirming diagnosis of UTI.  Rx Macrobid  100 mg twice daily for 5 days.      Norleen April, MD [name]

## 2024-04-18 ENCOUNTER — Other Ambulatory Visit: Payer: Self-pay

## 2024-04-20 ENCOUNTER — Other Ambulatory Visit: Payer: Self-pay

## 2024-04-21 ENCOUNTER — Other Ambulatory Visit (HOSPITAL_COMMUNITY): Payer: Self-pay

## 2024-04-22 ENCOUNTER — Other Ambulatory Visit: Payer: Self-pay

## 2024-04-22 ENCOUNTER — Emergency Department (HOSPITAL_BASED_OUTPATIENT_CLINIC_OR_DEPARTMENT_OTHER): Admitting: Radiology

## 2024-04-22 ENCOUNTER — Emergency Department (HOSPITAL_BASED_OUTPATIENT_CLINIC_OR_DEPARTMENT_OTHER)
Admission: EM | Admit: 2024-04-22 | Discharge: 2024-04-22 | Disposition: A | Discharge status: Home / Self Care | Attending: Emergency Medicine | Admitting: Emergency Medicine

## 2024-04-22 ENCOUNTER — Other Ambulatory Visit (HOSPITAL_COMMUNITY): Payer: Self-pay

## 2024-04-22 ENCOUNTER — Encounter (HOSPITAL_BASED_OUTPATIENT_CLINIC_OR_DEPARTMENT_OTHER): Payer: Self-pay

## 2024-04-22 ENCOUNTER — Emergency Department (HOSPITAL_BASED_OUTPATIENT_CLINIC_OR_DEPARTMENT_OTHER)

## 2024-04-22 ENCOUNTER — Other Ambulatory Visit (HOSPITAL_BASED_OUTPATIENT_CLINIC_OR_DEPARTMENT_OTHER): Payer: Self-pay

## 2024-04-22 DIAGNOSIS — Z7901 Long term (current) use of anticoagulants: Secondary | ICD-10-CM | POA: Diagnosis not present

## 2024-04-22 DIAGNOSIS — R051 Acute cough: Secondary | ICD-10-CM | POA: Diagnosis not present

## 2024-04-22 DIAGNOSIS — R0602 Shortness of breath: Secondary | ICD-10-CM

## 2024-04-22 LAB — COMPREHENSIVE METABOLIC PANEL WITH GFR
ALT: 13 U/L (ref 0–44)
AST: 26 U/L (ref 15–41)
Albumin: 4 g/dL (ref 3.5–5.0)
Alkaline Phosphatase: 96 U/L (ref 38–126)
Anion gap: 14 (ref 5–15)
BUN: 7 mg/dL (ref 6–20)
CO2: 22 mmol/L (ref 22–32)
Calcium: 10 mg/dL (ref 8.9–10.3)
Chloride: 104 mmol/L (ref 98–111)
Creatinine, Ser: 0.86 mg/dL (ref 0.44–1.00)
GFR, Estimated: >60 mL/min (ref >60)
Glucose, Bld: 86 mg/dL (ref 70–99)
Potassium: 3.5 mmol/L (ref 3.5–5.1)
Sodium: 140 mmol/L (ref 135–145)
Total Bilirubin: 0.4 mg/dL (ref 0.0–1.2)
Total Protein: 6.8 g/dL (ref 6.5–8.1)

## 2024-04-22 LAB — CBC
HCT: 35.3 % — ABNORMAL LOW (ref 36.0–46.0)
Hemoglobin: 10.4 g/dL — ABNORMAL LOW (ref 12.0–15.0)
MCH: 21 pg — ABNORMAL LOW (ref 26.0–34.0)
MCHC: 29.5 g/dL — ABNORMAL LOW (ref 30.0–36.0)
MCV: 71.3 fL — ABNORMAL LOW (ref 80.0–100.0)
Platelets: 319 K/uL (ref 150–400)
RBC: 4.95 MIL/uL (ref 3.87–5.11)
RDW: 15.9 % — ABNORMAL HIGH (ref 11.5–15.5)
WBC: 6.7 K/uL (ref 4.0–10.5)
nRBC: 0 % (ref 0.0–0.2)

## 2024-04-22 LAB — RESP PANEL BY RT-PCR (RSV, FLU A&B, COVID)  RVPGX2
Influenza A by PCR: NEGATIVE
Influenza B by PCR: NEGATIVE
Resp Syncytial Virus by PCR: NEGATIVE
SARS Coronavirus 2 by RT PCR: NEGATIVE

## 2024-04-22 LAB — LACTIC ACID, PLASMA: Lactic Acid, Venous: 1.6 mmol/L (ref 0.5–1.9)

## 2024-04-22 LAB — TROPONIN T, HIGH SENSITIVITY: Troponin T High Sensitivity: <15 ng/L (ref 0–19)

## 2024-04-22 LAB — PRO BRAIN NATRIURETIC PEPTIDE: Pro Brain Natriuretic Peptide: 695 pg/mL — ABNORMAL HIGH (ref <300.0)

## 2024-04-22 MED ORDER — ONDANSETRON 4 MG PO TBDP
4.0000 mg | ORAL_TABLET | Freq: Three times a day (TID) | ORAL | 0 refills | Status: DC | PRN
  Filled 2024-04-22 (×3): qty 20, 7d supply, fill #0

## 2024-04-22 MED ORDER — AEROCHAMBER PLUS FLO-VU LARGE MISC
1.0000 | Freq: Once | Status: AC
  Administered 2024-04-22 (×2): 1

## 2024-04-22 MED ORDER — LEVOFLOXACIN IN D5W 750 MG/150ML IV SOLN
750.0000 mg | Freq: Once | INTRAVENOUS | Status: AC
  Administered 2024-04-22 (×2): 750 mg via INTRAVENOUS
  Filled 2024-04-22: qty 150

## 2024-04-22 MED ORDER — PREDNISONE 20 MG PO TABS
40.0000 mg | ORAL_TABLET | Freq: Every day | ORAL | 0 refills | Status: DC
  Filled 2024-04-22 (×2): qty 10, 5d supply, fill #0

## 2024-04-22 MED ORDER — PREDNISONE 20 MG PO TABS
40.0000 mg | ORAL_TABLET | Freq: Every day | ORAL | 0 refills | Status: AC
  Filled 2024-04-22 (×5): qty 10, 5d supply, fill #0

## 2024-04-22 MED ORDER — ACETAMINOPHEN 500 MG PO TABS
1000.0000 mg | ORAL_TABLET | Freq: Once | ORAL | Status: AC
  Administered 2024-04-22 (×2): 1000 mg via ORAL
  Filled 2024-04-22: qty 2

## 2024-04-22 MED ORDER — ALBUTEROL SULFATE (2.5 MG/3ML) 0.083% IN NEBU
2.5000 mg | INHALATION_SOLUTION | Freq: Once | RESPIRATORY_TRACT | Status: AC
  Administered 2024-04-22 (×2): 2.5 mg via RESPIRATORY_TRACT
  Filled 2024-04-22: qty 3

## 2024-04-22 MED ORDER — LEVOFLOXACIN 500 MG PO TABS
500.0000 mg | ORAL_TABLET | Freq: Every day | ORAL | 0 refills | Status: DC
  Filled 2024-04-22 (×2): qty 7, 7d supply, fill #0

## 2024-04-22 MED ORDER — IOHEXOL 350 MG/ML SOLN
100.0000 mL | Freq: Once | INTRAVENOUS | Status: AC | PRN
  Administered 2024-04-22 (×2): 80 mL via INTRAVENOUS

## 2024-04-22 MED ORDER — METHYLPREDNISOLONE SODIUM SUCC 125 MG IJ SOLR
125.0000 mg | Freq: Once | INTRAMUSCULAR | Status: AC
  Administered 2024-04-22 (×2): 125 mg via INTRAVENOUS

## 2024-04-22 MED ORDER — ONDANSETRON 4 MG PO TBDP
4.0000 mg | ORAL_TABLET | Freq: Three times a day (TID) | ORAL | 0 refills | Status: DC | PRN
  Filled 2024-04-22 (×2): qty 20, 7d supply, fill #0

## 2024-04-22 MED ORDER — IPRATROPIUM-ALBUTEROL 0.5-2.5 (3) MG/3ML IN SOLN
RESPIRATORY_TRACT | Status: DC
Start: 2024-04-22 — End: 2024-04-22
  Filled 2024-04-22: qty 3

## 2024-04-22 MED ORDER — IPRATROPIUM-ALBUTEROL 0.5-2.5 (3) MG/3ML IN SOLN
3.0000 mL | Freq: Once | RESPIRATORY_TRACT | Status: AC
  Administered 2024-04-22 (×2): 3 mL via RESPIRATORY_TRACT

## 2024-04-22 MED ORDER — LEVOFLOXACIN 500 MG PO TABS
500.0000 mg | ORAL_TABLET | Freq: Every day | ORAL | 0 refills | Status: DC
  Filled 2024-04-22 (×3): qty 7, 7d supply, fill #0

## 2024-04-22 MED ORDER — ONDANSETRON 4 MG PO TBDP
8.0000 mg | ORAL_TABLET | Freq: Once | ORAL | Status: AC
  Administered 2024-04-22 (×2): 8 mg via ORAL
  Filled 2024-04-22: qty 2

## 2024-04-22 MED ORDER — ALBUTEROL SULFATE (2.5 MG/3ML) 0.083% IN NEBU
INHALATION_SOLUTION | RESPIRATORY_TRACT | Status: AC
  Filled 2024-04-22: qty 3

## 2024-04-22 MED ORDER — ALBUTEROL SULFATE (2.5 MG/3ML) 0.083% IN NEBU
2.5000 mg | INHALATION_SOLUTION | Freq: Once | RESPIRATORY_TRACT | Status: AC
  Administered 2024-04-22 (×2): 2.5 mg via RESPIRATORY_TRACT

## 2024-04-22 NOTE — ED Provider Notes (Signed)
 [place] EMERGENCY DEPARTMENT AT [facility] Provider Note   CSN: [id number] Arrival date & time: 04/22/24  1043     Patient presents with: No chief complaint on file.   Andrea Berry is a 58 y.o. female.   HPI   58 year old female presents emergency department with complaints of cough, nausea, shortness of breath.  States the symptoms began on Sunday worsened yesterday.  Does have Breo as well as albuterol  inhaler which she used yesterday without significant improvement.  States he has a history of pneumonia and this feels similar.  Denies any chest pain.  Has felt hot/warm but without any documented fever at home.  Denies any abdominal pain, nausea, vomiting, urinary symptoms, change in bowel habits.  Patient is currently immunosuppressed on rituximab.  Past medical history significant for hypertrophic cardiomyopathy, PE, lupus, chronic pain syndrome, chronic central neuropathic pain, neuromyelitis optica, lupus cerebritis, CVA, GERD, secondary adrenal insufficiency, bronchiectasis, ILD  Prior to Admission medications   Medication Sig Start Date End Date Taking? Authorizing Provider  albuterol  (VENTOLIN  HFA) 108 (90 Base) MCG/ACT inhaler Inhale 2 puffs into the lungs every 6 (six) hours as needed for wheezing or shortness of breath. 01/30/24   McDiarmid, Krystal BIRCH, MD  alendronate  (FOSAMAX ) 70 MG tablet Take 1 tablet (70 mg total) by mouth every Friday. 11/01/23   McDiarmid, Krystal BIRCH, MD  amitriptyline  (ELAVIL ) 10 MG tablet Take 1 tablet (10 mg total) by mouth at bedtime. 01/30/24   McDiarmid, Krystal BIRCH, MD  atorvastatin  (LIPITOR) 40 MG tablet Take 1 tablet (40 mg total) by mouth daily. 10/28/23   McDiarmid, Krystal BIRCH, MD  Cholecalciferol (VITAMIN D3) 50 MCG (2000 UT) TABS Take 2 tablets by mouth daily.    [provider]  diltiazem  (CARDIZEM  CD) 180 MG 24 hr capsule Take 1 capsule (180 mg total) by mouth daily. 01/30/24   McDiarmid, Krystal BIRCH, MD  DULoxetine  (CYMBALTA ) 60 MG  capsule Take 1 capsule (60 mg total) by mouth daily. 01/30/24   McDiarmid, Krystal BIRCH, MD  esomeprazole  (NEXIUM ) 20 MG capsule Take 1 capsule (20 mg total) by mouth daily at 12 noon. 01/30/24   McDiarmid, Krystal BIRCH, MD  gabapentin  (NEURONTIN ) 300 MG capsule Take 2 capsules (600 mg total) by mouth 2 (two) times daily. 01/27/24   McDiarmid, Krystal BIRCH, MD  linaclotide  (LINZESS ) 145 MCG CAPS capsule Take 1 capsule (145 mcg total) by mouth daily before breakfast. 01/30/24   McDiarmid, Krystal BIRCH, MD  LORazepam  (ATIVAN ) 0.5 MG tablet Take 1 tablet (0.5 mg total) by mouth 2 (two) times daily. 12/10/23   McDiarmid, Krystal BIRCH, MD  nitrofurantoin , macrocrystal-monohydrate, (MACROBID ) 100 MG capsule Take 1 capsule (100 mg total) by mouth 2 (two) times daily for 5 days. 04/17/24 04/26/24  Rosendo Rush, MD  ondansetron  (ZOFRAN -ODT) 4 MG disintegrating tablet Take 1 tablet (4 mg total) by mouth every 8 (eight) hours as needed for nausea and vomiting. 05/31/23   McDiarmid, Krystal BIRCH, MD  OXcarbazepine  (TRILEPTAL ) 150 MG tablet Take 2 tablets (300 mg total) by mouth 2 (two) times daily. 03/05/24   McDiarmid, Krystal BIRCH, MD  oxyCODONE  (OXY IR/ROXICODONE ) 5 MG immediate release tablet Take 0.5 tablets (2.5 mg total) by mouth every 4 (four) hours as needed for severe pain (pain score 7-10). 01/28/24   Cleotilde Perkins, DO  Pirfenidone  267 MG TABS Take 3 tablets by mouth in the morning, at noon, and at bedtime. 09/21/23   [provider]  predniSONE  (DELTASONE ) 5 MG tablet Take 1  tablet (5 mg total) by mouth daily with breakfast. 01/30/24   McDiarmid, Krystal BIRCH, MD  RiTUXimab (RITUXAN IV) Inject 1,000 mg into the vein See admin instructions. 1,000 mg into the vein every 6 months, then two weeks later another 1,000 mg injection    [provider]  rivaroxaban  (XARELTO ) 10 MG TABS tablet Take 1 tablet (10 mg total) by mouth daily. 12/11/23   McDiarmid, Krystal BIRCH, MD  tiotropium (SPIRIVA  HANDIHALER) 18 MCG inhalation capsule Place 1 capsule (18 mcg total)  into inhaler and inhale daily. 01/30/24   McDiarmid, Krystal BIRCH, MD  traZODone  (DESYREL ) 50 MG tablet Take 0.5-1 tablets (25-50 mg total) by mouth at bedtime as needed for sleep. 01/30/24   McDiarmid, Krystal BIRCH, MD    Allergies: Beta adrenergic blockers and Rocephin  [ceftriaxone ]    Review of Systems  All other systems reviewed and are negative.   Updated Vital Signs There were no vitals taken for this visit.  Physical Exam Vitals and nursing note reviewed.  Constitutional:      General: She is not in acute distress.    Appearance: She is well-developed.  HENT:     Head: Normocephalic and atraumatic.  Eyes:     Conjunctiva/sclera: Conjunctivae normal.  Cardiovascular:     Rate and Rhythm: Normal rate and regular rhythm.     Heart sounds: No murmur heard. Pulmonary:     Effort: Pulmonary effort is normal. No respiratory distress.     Breath sounds: Rales present.     Comments: Rales auscultated left greater than right lower lung fields. Abdominal:     Palpations: Abdomen is soft.     Tenderness: There is no abdominal tenderness.  Musculoskeletal:        General: No swelling.     Cervical back: Neck supple.  Skin:    General: Skin is warm and dry.     Capillary Refill: Capillary refill takes less than 2 seconds.  Neurological:     Mental Status: She is alert.  Psychiatric:        Mood and Affect: Mood normal.     (all labs ordered are listed, but only abnormal results are displayed) Labs Reviewed - No data to display  EKG: None  Radiology: No results found.   Procedures   Medications Ordered in the ED - No data to display  Clinical Course as of 04/22/24 1511  Wed Apr 22, 2024  1234 Patient does report improvement of symptoms after nebulized therapy.  Chest x-ray showed possible pneumonia.  Will trial additional nebulized, Solu-Medrol  and obtain CT imaging of chest. [CR]    Clinical Course User Index [CR] Silver Wonda LABOR, PA                                  Medical Decision Making Amount and/or Complexity of Data Reviewed Labs: ordered. Radiology: ordered.  Risk OTC drugs. Prescription drug management.   This patient presents to the ED for concern of shortness of breath, this involves an extensive number of treatment options, and is a complaint that carries with it a high risk of complications and morbidity.  The differential diagnosis includes ACS, PE, pneumothorax, COPD/asthma, anemia, pericarditis/myocarditis/tamponade, pneumonia, viral URI, CHF, other   Co morbidities that complicate the patient evaluation  See HPI   Additional history obtained:  Additional history obtained from EMR External records from outside source obtained and reviewed including hospital records  Lab Tests:  I Ordered, and personally interpreted labs.  The pertinent results include: No leukocytosis.  Anemia with hemoglobin of 10.4 which is microcytic in the patient's baseline.  Platelets within normal range.  No electrolyte abnormalities.  No transaminitis.  No renal dysfunction.  Viral testing negative.  Troponin negative.  BNP age-adjusted normal.  Lactic acid within normal limits.  VBG was unable to be transferred over.  Showed pH of 7.452, pCO2 of 33.6, bicarb of 23.5   Imaging Studies ordered:  I ordered imaging studies including chest x-ray, CT angio chest PE I independently visualized and interpreted imaging which showed  Chest x-ray: Possible left retrocardiac/consolidation. CT angio chest PE: No evidence of PE or other acute cardiopulmonary abnormality I agree with the radiologist interpretation   Cardiac Monitoring: / EKG:  The patient was maintained on a cardiac monitor.  I personally viewed and interpreted the cardiac monitored which showed an underlying rhythm of: Sinus tachycardia.  Probable LVH with Ehrmann elevation pattern.  No acute ischemic change or prior EKG performed.   Consultations Obtained:  N/a   Problem List / ED  Course / Critical interventions / Medication management  Cough, shortness of breath I ordered medication including albuterol , DuoNeb, Levaquin , Zofran , Tylenol    Reevaluation of the patient after these medicines showed that the patient improved I have reviewed the patients home medicines and have made adjustments as needed   Social Determinants of Health:  Denies tobacco/illicit drugs.   Test / Admission - Considered:  Cough, shortness of breath Vitals signs significant for initial tachycardia, tachypnea which resolved with time elapsed and medicine administered while in ed. Otherwise within normal range and stable throughout visit. Laboratory/imaging studies significant for: See above  58 year old female presents emergency department with complaints of cough, nausea, shortness of breath.  States the symptoms began on Sunday worsened yesterday.  Does have Breo as well as albuterol  inhaler which she used yesterday without significant improvement.  States he has a history of pneumonia and this feels similar.  Denies any chest pain.  Has felt hot/warm but without any documented fever at home.  Denies any abdominal pain, nausea, vomiting, urinary symptoms, change in bowel habits.  Patient is currently immunosuppressed on rituximab. On exam initially, faint Rales auscultated left greater than right lower lung fields with faint wheezing.  Workup today reassuring.  Labs unremarkable for any acute emergent process.  Normal white count.  Age-adjusted BNP normal without clinical evidence of volume overload; low suspicion for CHF.  Troponin negative.  EKG nonischemic; low suspicion for ACS.  Chest x-ray obtained that showed possible pneumonia.  Given patient's history of PE in the setting of shortness of breath, tachycardia, CT angio chest PE was obtained which was negative.  Patient was treated with multiple nebulized therapies in the ED and noted significant improvement of breathing.  Will treat for  possible pneumonia as well as short course of corticosteroids along with continued breathing treatments at home given improvement of symptoms with similar medicines in the ED.  Recommend follow-up with primary care for reassessment.  Treatment plan discussed with patient and She acknowledged understanding was agreeable to said plan.  Patient well-appearing, afebrile in no acute distress, non-hypoxic Worrisome signs and symptoms were discussed with the patient, and the patient acknowledged understanding to return to the ED if noticed. Patient was stable upon discharge.       Final diagnoses:  None    ED Discharge Orders     None  Silver Wonda LABOR, [place] 04/22/24 1520    Doretha Folks, MD 04/23/24 [phone]

## 2024-04-22 NOTE — Discharge Instructions (Addendum)
 As discussed, your workup today was overall reassuring.  X-ray imaging showed concern for pneumonia.  Also concern for acute inflammatory process in the lung.  Will send you home with prednisone .  To take daily baseline dose.  Return back to your baseline medicine.  Will also send you an antibiotic to take as directed.  Recommend close follow-up with your primary care for reassessment.  Do not hesitate to return to emergency department if the worrisome signs and symptoms discussed become apparent.

## 2024-04-22 NOTE — ED Triage Notes (Signed)
 SOB, productive cough, nausea, and headaches onset Sunday and worsened yesterday. PMH PNA and lung disease.

## 2024-04-22 NOTE — ED Notes (Signed)
 2nd b.c. not performed d/t very diffcult IV access.

## 2024-04-22 NOTE — ED Notes (Signed)
 Patient transported to CT

## 2024-04-27 LAB — CULTURE, BLOOD (ROUTINE X 2)
Culture: NO GROWTH
Special Requests: ADEQUATE

## 2024-04-28 ENCOUNTER — Encounter: Payer: Self-pay | Admitting: Internal Medicine

## 2024-04-28 ENCOUNTER — Ambulatory Visit: Admitting: Genetic Counselor

## 2024-04-28 ENCOUNTER — Ambulatory Visit: Attending: Cardiology | Admitting: Internal Medicine

## 2024-04-28 ENCOUNTER — Ambulatory Visit

## 2024-04-28 VITALS — BP 126/78 | HR 90 | Ht 61.0 in | Wt 167.2 lb

## 2024-04-28 DIAGNOSIS — I951 Orthostatic hypotension: Secondary | ICD-10-CM | POA: Diagnosis not present

## 2024-04-28 DIAGNOSIS — R55 Syncope and collapse: Secondary | ICD-10-CM

## 2024-04-28 DIAGNOSIS — J849 Interstitial pulmonary disease, unspecified: Secondary | ICD-10-CM

## 2024-04-28 DIAGNOSIS — I422 Other hypertrophic cardiomyopathy: Secondary | ICD-10-CM

## 2024-04-28 HISTORY — DX: Other hypertrophic cardiomyopathy: I42.2

## 2024-04-28 NOTE — Progress Notes (Signed)
 Called pt left a message that will have Genetic testing with Dr. Fairy on 05/05/24.  Pt will not complete home testing. Advised to call our office with questions or concerns.

## 2024-04-28 NOTE — Patient Instructions (Addendum)
 Medication Instructions:  Your physician recommends that you continue on your current medications as directed. Please refer to the Current Medication list given to you today.  *If you need a refill on your cardiac medications before your next appointment, please call your pharmacy*  Lab Work: NONE  If you have labs (blood work) drawn today and your tests are completely normal, you will receive your results only by: MyChart Message (if you have MyChart) OR A paper copy in the mail If you have any lab test that is abnormal or we need to change your treatment, we will call you to review the results.  Testing/Procedures: Your physician has requested that you have a cardiac MRI. Cardiac MRI uses a computer to create images of your heart as its beating, producing both still and moving pictures of your heart and major blood vessels. For further information please visit [email or web address]. Please follow the instruction sheet given to you today for more information.   Your physician has requested that you wear a heart monitor.  You will complete Genetic Testing with Dr. Fairy on 05/05/24 at 9 am.  Follow-Up: At [facility], you and your health needs are our priority.  As part of our continuing mission to provide you with exceptional heart care, our providers are all part of one team.  This team includes your primary Cardiologist (physician) and Advanced Practice Providers or APPs (Physician Assistants and Nurse Practitioners) who all work together to provide you with the care you need, when you need it.  Your next appointment:   3-4 month(s)  Provider:   Stanly Leavens, MD or Orren Fabry, PA    Other Instructions  You are scheduled for Cardiac MRI at the location below.  Please arrive for your appointment at ______________ . ?  [facility] [address] [place], [place] [address] Please take advantage of the free valet parking available at the [facility] and  [employer] (Entrance C).  Proceed to the [facility] Radiology Department (First Floor) for check-in.   OR   [facility] [address] [place], [place] [address] Please go to the [facility] and check-in with the desk attendant.   Magnetic resonance imaging (MRI) is a painless test that produces images of the inside of the body without using Xrays.  During an MRI, strong magnets and radio waves work together in a Data processing manager to form detailed images.   MRI images may provide more details about a medical condition than X-rays, CT scans, and ultrasounds can provide.  You may be given earphones to listen for instructions.  You may eat a light breakfast and take medications as ordered with the exception of furosemide , hydrochlorothiazide, chlorthalidone or spironolactone (or any other fluid pill). If you are undergoing a stress MRI, please avoid stimulants for 12 hr prior to test. (I.e. Caffeine, nicotine, chocolate, or antihistamine medications)  If your provider has ordered anti-anxiety medications for this test, then you will need a driver.  An IV will be inserted into one of your veins. Contrast material will be injected into your IV. It will leave your body through your urine within a day. You may be told to drink plenty of fluids to help flush the contrast material out of your system.  You will be asked to remove all metal, including: Watch, jewelry, and other metal objects including hearing aids, hair pieces and dentures. Also wearable glucose monitoring systems (ie. Freestyle Libre and Omnipods) (Braces and fillings normally are  not a problem.)   TEST WILL TAKE APPROXIMATELY 1 HOUR  PLEASE NOTIFY SCHEDULING AT LEAST 24 HOURS IN ADVANCE IF YOU ARE UNABLE TO KEEP YOUR APPOINTMENT. [phone]  For more information and frequently asked questions, please visit our website : [email or web address]  Please call the Cardiac Imaging  Nurse Navigators with any questions/concerns. [phone] Office    ZIO XT- Long Term Monitor Instructions  Your physician has requested you wear a ZIO patch monitor for 14 days.  This is a single patch monitor. [employer] supplies one patch monitor per enrollment. Additional stickers are not available. Please do not apply patch if you will be having a Nuclear Stress Test,   Cardiac CT, MRI, or Chest Xray during the period you would be wearing the  monitor. The patch cannot be worn during these tests. You cannot remove and re-apply the  ZIO XT patch monitor.  Your ZIO patch monitor will be mailed 3 day USPS to your address on file. It may take 3-5 days  to receive your monitor after you have been enrolled.  Once you have received your monitor, please review the enclosed instructions. Your monitor  has already been registered assigning a specific monitor serial # to you.  Billing and Patient Assistance Program Information  We have supplied [employer] with any of your insurance information on file for billing purposes. [employer] offers a sliding scale Patient Assistance Program for patients that do not have  insurance, or whose insurance does not completely cover the cost of the ZIO monitor.  You must apply for the Patient Assistance Program to qualify for this discounted rate.  To apply, please call [employer] at [phone], select option 4, select option 2, ask to apply for  Patient Assistance Program. Meredeth will ask your household income, and how many people  are in your household. They will quote your out-of-pocket cost based on that information.  [employer] will also be able to set up a 47-month, interest-free payment plan if needed.  Applying the monitor   Shave hair from upper left chest.  Hold abrader disc by orange tab. Rub abrader in 40 strokes over the upper left chest as  indicated in your monitor instructions.  Clean area with 4 enclosed alcohol pads. Let dry.  Apply patch as  indicated in monitor instructions. Patch will be placed under collarbone on left  side of chest with arrow pointing upward.  Rub patch adhesive wings for 2 minutes. Remove white label marked 1. Remove the white  label marked 2. Rub patch adhesive wings for 2 additional minutes.  While looking in a mirror, press and release button in center of patch. A small green light will  flash 3-4 times. This will be your only indicator that the monitor has been turned on.  Do not shower for the first 24 hours. You may shower after the first 24 hours.  Press the button if you feel a symptom. You will hear a small click. Record Date, Time and  Symptom in the Patient Logbook.  When you are ready to remove the patch, follow instructions on the last 2 pages of Patient  Logbook. Stick patch monitor onto the last page of Patient Logbook.  Place Patient Logbook in the blue and white box. Use locking tab on box and tape box closed  securely. The blue and white box has prepaid postage on it. Please place it in the mailbox as  soon as possible. Your physician should have your test results approximately 7 days after the  monitor has been mailed back to [facility].  Call [facility] Customer Care at [phone] if you have questions regarding  your ZIO XT patch monitor. Call them immediately if you see an orange light blinking on your  monitor.  If your monitor falls off in less than 4 days, contact our Monitor department at [phone].  If your monitor becomes loose or falls off after 4 days call [employer] at [phone] for  suggestions on securing your monitor

## 2024-04-28 NOTE — Progress Notes (Signed)
 Cardiology Office Note:  .    Date:  04/28/2024  ID:  Andrea Berry, DOB 1966-01-07, MRN [id number] PCP: McDiarmid, Krystal BIRCH, MD  [facility] Providers Cardiologist:  None     CC: Transition HCM Care Consulted for the evaluation of Apical HCM at the behest of Dr. Levern  History of Present Illness: .    Andrea Berry is a 58 y.o. female  with apical hypertrophic cardiomyopathy who presents with syncope.  She has a history of apical hypertrophic cardiomyopathy, diagnosed in 2021 and confirmed in May 2025. She experienced a syncopal episode in May 2025 while seated, associated with nausea and poor appetite. At her last evaluation in May 2025, she was told she had orthostatic hypotension. An echocardiogram showed an apical thickness of 15 mm. She reports another similar episode approximately six months prior, where she passed out while attempting to get a bottle of water.  She has interstitial lung disease and a history of pulmonary embolism. She reports a persistent cough but feels 'good' when playing with her grandchildren, although she gets 'a little tired sometimes.'  Her past medical history includes hypertensive heart disease, a prior stroke with residual left hemiparesis, transverse myelitis, blindness since 2008, and lupus. She is currently on rituximab for autoimmune sequelae.  She has three sons and a large family with seven brothers and eight sisters. There is no known family history of hypertrophic cardiomyopathy or sudden cardiac death. She works and enjoys playing with her grandchildren.  No chest pain and breathing issues during physical activity. Reports feeling tired sometimes.  Discussed the use of AI scribe software for clinical note transcription with the patient, who gave verbal consent to proceed.   Relevant histories: .  Social  - The patient is blind, has three sons, and grandchildren. The patient has a history of lupus, transverse myelitis, and  interstitial lung disease. ROS: As per HPI.   Studies Reviewed: .     Cardiac Studies & Procedures   ______________________________________________________________________________________________     ECHOCARDIOGRAM  ECHOCARDIOGRAM COMPLETE 01/22/2024  Narrative ECHOCARDIOGRAM REPORT    Patient Name:   Andrea Berry Date of Exam: 01/22/2024 Medical Rec #:  [id number]          Height:       61.0 in Accession #:    [id number]         Weight:       178.1 lb Date of Birth:  01/25/1966          BSA:          1.798 m Patient Age:    57 years           BP:           122/66 mmHg Patient Gender: F                  HR:           80 bpm. Exam Location:  Inpatient  Procedure: 2D Echo, Cardiac Doppler and Color Doppler (Both Spectral and Color Flow Doppler were utilized during procedure).  Indications:    Syncope  History:        Patient has no prior history of Echocardiogram examinations and Patient has prior history of Echocardiogram examinations, most recent 04/12/2022. Signs/Symptoms:Syncope.  Sonographer:    Philomena Daring Referring Phys: LAURAINE NORSE  IMPRESSIONS   1. Left ventricular ejection fraction, by estimation, is 70 to 75%. The left ventricle has hyperdynamic function. The left ventricle has  no regional wall motion abnormalities. There is severe asymmetric left ventricular hypertrophy of the septal and apical segments. Left ventricular diastolic parameters are consistent with Grade I diastolic dysfunction (impaired relaxation). 2. Right ventricular systolic function is normal. The right ventricular size is normal. Tricuspid regurgitation signal is inadequate for assessing PA pressure. 3. The mitral valve is grossly normal. Trivial mitral valve regurgitation. No evidence of mitral stenosis. 4. The aortic valve was not well visualized. Aortic valve regurgitation is mild. No aortic stenosis is present. 5. The inferior vena cava is normal in size with greater than 50%  respiratory variability, suggesting right atrial pressure of 3 mmHg.  FINDINGS Left Ventricle: Left ventricular ejection fraction, by estimation, is 70 to 75%. The left ventricle has hyperdynamic function. The left ventricle has no regional wall motion abnormalities. The left ventricular internal cavity size was normal in size. There is severe asymmetric left ventricular hypertrophy of the septal and apical segments. Left ventricular diastolic parameters are consistent with Grade I diastolic dysfunction (impaired relaxation).  Right Ventricle: The right ventricular size is normal. No increase in right ventricular wall thickness. Right ventricular systolic function is normal. Tricuspid regurgitation signal is inadequate for assessing PA pressure.  Left Atrium: Left atrial size was normal in size.  Right Atrium: Right atrial size was normal in size.  Pericardium: There is no evidence of pericardial effusion.  Mitral Valve: The mitral valve is grossly normal. Trivial mitral valve regurgitation. No evidence of mitral valve stenosis.  Tricuspid Valve: The tricuspid valve is normal in structure. Tricuspid valve regurgitation is trivial. No evidence of tricuspid stenosis.  Aortic Valve: The aortic valve was not well visualized. Aortic valve regurgitation is mild. No aortic stenosis is present. Aortic valve mean gradient measures 6.5 mmHg. Aortic valve peak gradient measures 12.8 mmHg. Aortic valve area, by VTI measures 1.92 cm.  Pulmonic Valve: The pulmonic valve was not well visualized. Pulmonic valve regurgitation is trivial. No evidence of pulmonic stenosis.  Aorta: The aortic root is normal in size and structure.  Venous: The inferior vena cava is normal in size with greater than 50% respiratory variability, suggesting right atrial pressure of 3 mmHg.  IAS/Shunts: No atrial level shunt detected by color flow Doppler.   LEFT VENTRICLE PLAX 2D LVIDd:         3.30 cm   Diastology LVIDs:          2.10 cm   LV e' medial:    5.44 cm/s LV PW:         1.00 cm   LV E/e' medial:  10.5 LV IVS:        1.60 cm   LV e' lateral:   5.98 cm/s LVOT diam:     1.60 cm   LV E/e' lateral: 9.5 LV SV:         60 LV SV Index:   33 LVOT Area:     2.01 cm   RIGHT VENTRICLE             IVC RV S prime:     16.85 cm/s  IVC diam: 1.70 cm TAPSE (M-mode): 2.0 cm  LEFT ATRIUM             Index        RIGHT ATRIUM           Index LA diam:        3.30 cm 1.84 cm/m   RA Area:     12.70 cm LA Vol (A2C):  36.3 ml 20.19 ml/m  RA Volume:   30.20 ml  16.80 ml/m LA Vol (A4C):   53.1 ml 29.53 ml/m LA Biplane Vol: 44.3 ml 24.64 ml/m AORTIC VALVE AV Area (Vmax):    1.83 cm AV Area (Vmean):   1.77 cm AV Area (VTI):     1.92 cm AV Vmax:           179.00 cm/s AV Vmean:          116.500 cm/s AV VTI:            0.314 m AV Peak Grad:      12.8 mmHg AV Mean Grad:      6.5 mmHg LVOT Vmax:         162.50 cm/s LVOT Vmean:        102.750 cm/s LVOT VTI:          0.300 m LVOT/AV VTI ratio: 0.96  AORTA Ao Root diam: 2.50 cm Ao Asc diam:  3.10 cm  MITRAL VALVE MV Area (PHT): 19.45 cm   SHUNTS MV Decel Time: 39 msec     Systemic VTI:  0.30 m MV E velocity: 57.10 cm/s  Systemic Diam: 1.60 cm  Soyla Merck MD Electronically signed by Soyla Merck MD Signature Date/Time: 01/22/2024/3:00:01 PM    Final    MONITORS  CARD TELE MON-INTERP 01/18/2022       ______________________________________________________________________________________________       Physical Exam:    VS:  BP 126/78   Pulse 90   Ht 5' 1 (1.549 m)   Wt 167 lb 3.2 oz (75.8 kg)   SpO2 96%   BMI 31.59 kg/m    Wt Readings from Last 3 Encounters:  04/28/24 167 lb 3.2 oz (75.8 kg)  04/22/24 170 lb (77.1 kg)  04/17/24 170 lb (77.1 kg)    Gen: no distress   Neck: No JVD Cardiac: No Rubs or Gallops, Diastolic Murmur, RRR +2 radial pulses Respiratory: Clear to auscultation bilaterally, normal effort, normal   respiratory rate GI: Soft, nontender, non-distended  MS: No  edema;  moves all extremities Integument: Skin feels warm Neuro:  At time of evaluation, alert and oriented to person/place/time/situation; blind Psych: Normal affect, patient feels well   ASSESSMENT AND PLAN: .    Hypertrophic Cardiomyopathy (Apical with maximal thickness 15 mm) - mild AI with no aneurysm, or resting LVOT gradient,   - without MR, Apical Aneurysm - suspicion of Fabry's/Danon/Noonan's or other mimics of HCM: low; CMR is pending - Gene variant: Pending- amenable to testing - NYHA I  - Non HCM Contributors to disease/status  ILD and HX of PE- managed by PCP; her symptoms are SOB and cough Lupus, Blindness; Transverse Myelitis- managed by different practioner; stable  Family history Reveiwed , Discussed family screening  - has three sons, is amenable to genetic testing.  Barrier may be insurance/cost  SCD  Assessment -01/2024 visit for syncope; though to be [facility];  -  2 year assessment for VT on rhythm monitor is pending - CMR is pending  Atrial fibrillation Assessment  - HCM-AF score 17 - Atrial arrhythmia management - heart monitor pending  Medication symptom plan - Apical hypertrophic cardiomyopathy diagnosed in 2021 and confirmed in May 2025. She exhibits no significant symptoms such as dyspnea, weight gain, or fluid retention. There is no evidence of apical aneurysm. She is at moderate risk for atrial fibrillation and sudden cardiac death, though there is no family history of sudden cardiac death. Genetic testing was  discussed but not pursued. Potential for scar formation and electrical risks, including atrial fibrillation and sudden cardiac death, were discussed. Emphasized the importance of monitoring and safety measures. - Order cardiac MRI to assess for scar formation. - Initiate heart monitor for two weeks to evaluate for atrial fibrillation. - Proceed with genetic testing  - Provide educational  materials for family regarding hypertrophic cardiomyopathy.  Winter follow up for SCD eval  Stanly Leavens, MD FASE [facility] Cardiologist [facility]  [address] [place], [address] [place], [place] [address] [phone]  11:16 AM

## 2024-04-30 ENCOUNTER — Other Ambulatory Visit (HOSPITAL_BASED_OUTPATIENT_CLINIC_OR_DEPARTMENT_OTHER): Payer: Self-pay

## 2024-04-30 ENCOUNTER — Encounter: Payer: Self-pay | Admitting: Pharmacist

## 2024-04-30 ENCOUNTER — Ambulatory Visit: Admission: EM | Admit: 2024-04-30 | Discharge: 2024-04-30 | Disposition: A | Discharge status: Home / Self Care

## 2024-04-30 ENCOUNTER — Encounter: Payer: Self-pay | Admitting: Emergency Medicine

## 2024-04-30 ENCOUNTER — Ambulatory Visit (INDEPENDENT_AMBULATORY_CARE_PROVIDER_SITE_OTHER)

## 2024-04-30 ENCOUNTER — Other Ambulatory Visit (HOSPITAL_COMMUNITY): Payer: Self-pay

## 2024-04-30 ENCOUNTER — Other Ambulatory Visit: Payer: Self-pay

## 2024-04-30 DIAGNOSIS — R051 Acute cough: Secondary | ICD-10-CM

## 2024-04-30 DIAGNOSIS — J189 Pneumonia, unspecified organism: Secondary | ICD-10-CM

## 2024-04-30 DIAGNOSIS — L0100 Impetigo, unspecified: Secondary | ICD-10-CM | POA: Diagnosis not present

## 2024-04-30 LAB — POC COVID19/FLU A&B COMBO
Covid Antigen, POC: NEGATIVE
Influenza A Antigen, POC: NEGATIVE
Influenza B Antigen, POC: NEGATIVE

## 2024-04-30 MED ORDER — MUPIROCIN 2 % EX OINT
1.0000 | TOPICAL_OINTMENT | Freq: Two times a day (BID) | CUTANEOUS | 0 refills | Status: AC
  Filled 2024-04-30: qty 22, 11d supply, fill #0
  Filled 2024-04-30: qty 15, 8d supply, fill #0

## 2024-04-30 MED ORDER — DOXYCYCLINE HYCLATE 100 MG PO CAPS
100.0000 mg | ORAL_CAPSULE | Freq: Two times a day (BID) | ORAL | 0 refills | Status: AC
Start: 2024-04-30 — End: 2024-05-07
  Filled 2024-04-30 (×2): qty 14, 7d supply, fill #0

## 2024-04-30 MED ORDER — AZITHROMYCIN 250 MG PO TABS
250.0000 mg | ORAL_TABLET | Freq: Every day | ORAL | 0 refills | Status: DC
  Filled 2024-04-30 (×2): qty 6, 5d supply, fill #0

## 2024-04-30 NOTE — ED Provider Notes (Signed)
 [facility]    CSN: [id number] Arrival date & time: 04/30/24  1011      History   Chief Complaint Chief Complaint  Patient presents with   Nasal Congestion   Cough   Headache    HPI Andrea Berry is a 58 y.o. female.   Patient here today for evaluation of cough, congestion and headache. Symptoms started a week ago. She was seen in the ED and had workup there with suspected pneumonia. She did have improvement with antibiotics but she reports after completing them symptoms returned.  She has not had any fever, chills, sore throat.  She is no longer having any shortness of breath.  She also reports rash underneath her nose that is bothering her as it is somewhat painful.  The history is provided by the patient.  Cough Associated symptoms: headaches and rash   Associated symptoms: no chills, no ear pain, no eye discharge, no fever, no shortness of breath and no wheezing   Headache Associated symptoms: congestion and cough   Associated symptoms: no abdominal pain, no diarrhea, no ear pain, no fever, no nausea and no vomiting     Past Medical History:  Diagnosis Date   Acute left hemiparesis on chronic left hemiparesis 04/12/2022   Acute respiratory distress syndrome (ARDS) due to COVID-19 virus (HCC) 03/27/2019   Acute respiratory failure with hypoxia (HCC) 02/16/2020   Anemia of chronic disease 10/28/2015   Anxiety 07/12/2018   Arthritis    Ataxia 01/03/2016   Blind    Blindness of both eyes 04/12/2022   CAP (community acquired pneumonia) 04/07/2023   Chronic central neuropathic pain 10/02/2018   Dx by [facility] 09/2017   Chronic coughing 09/10/2016   Chronic pain syndrome 07/12/2018   Chronic respiratory failure with hypoxia (HCC)    Chronic rhinitis 01/07/2019   COVID-19 02/04/2020   Decreased strength 10/08/2018   Proximal leg muscles   Depression    Depression with anxiety 01/03/2016   Devic's disease (HCC)    Dysesthesia 01/03/2016    Dyspnea on exertion 11/07/2018   Elevated BP without diagnosis of hypertension 08/28/2019   Report of [facility] nurse in December with normal SBPs but DBPs in low 90s    Eustachian tube dysfunction 11/18/2018   L>R. Followed by [facility].   Eustachian tube dysfunction, bilateral 09/18/2018   Eustachian tube dysfunction, left 11/13/2018   L>R. Followed by [facility] ENT.   Fall 05/10/2023   GERD (gastroesophageal reflux disease)    Greater trochanteric bursitis of left hip 04/27/2020   Formatting of this note might be different from the original. Added automatically from request for surgery [id number]  Formatting of this note might be different from the original. Added automatically from request for surgery [id number]   Headache(784.0)    History of chicken pox 10/08/2018   History of COVID-19 02/16/2020   History of CVA (cerebrovascular accident)    History of kidney stones    History of pulmonary embolism 12/10/2018   Lupus Antiphosholipid (+). Patient on secondary prophylaxis with rivaroxaban  20 mg daily long-term.     Hypertension    Hypertrophic cardiomyopathy (HCC) 03/02/2020   Hyponatremia 11/25/2015   Impairment of balance 10/08/2018   Kidney stone 09/14/2022   Legal blindness 01/30/2014   Liver masses 04/13/2022   MRI Abdomen 11/06/22 [facility]: Focus of arterial phase hyperenhancement in the right hepatic lobe  is consistent with a vascular shunt. No evidence of hepatic mass. Cholelithiasis. No radiographic evidence of cholecystitis or  biliary  ductal dilatation.      PET Scan 04/10/22 by Pulmonology for work-up of Solitary Pulmonary nodule found Multiple hypermetabolic hepatic mass lesions. Hepatic metastasis    Long-term corticosteroid use 10/08/2018   Lung nodule 05/24/2022   07/05/22 [facility] PET Scan: 1.  Interval decrease in size of a linear left lower lobe pulmonary nodule without radiotracer uptake, favored scarring in the setting of prior infection. No new pulmonary nodule.  2.   Waxing and waning nonspecific scattered hypermetabolic nodularity throughout both lungs with decreased metabolic activity of bilateral hilar lymph nodes and stable to slightly increased hepat   Lupus    Lupus anticoagulant disorder (HCC) 10/02/2018   Pulmonary Embolism 2017   Lupus cerebritis (HCC) 11/25/2015   Possible explanation of 11/25/2015 acute hyperintensity findings on Brain MRI.    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   Orthostatic dizziness, Recurrent 10/08/2018   Associated with medications   Otalgia 07/12/2018   Personal history of immunosupression therapy 10/08/2018   Chronic Azathioprine  and prednisone  therapy for Devic's disease   Pigmented skin lesion of uncertain nature 11/06/2018   Pneumonia 02/21/2020   Pneumonia of right lower lobe due to infectious organism 02/21/2020   Postmenopause 10/21/2020   Pulmonary embolism (HCC) 2017   Shortness of breath    due to medication   Skin lesion of chest wall 11/07/2018   SLE (systemic lupus erythematosus related syndrome) (HCC) 11/07/2018   Smith antibody positive 04/07/2019   Stroke (HCC) 2008   visually impaired   Stroke (HCC) 04/13/2022   Syncope    Transaminasemia 10/21/2020   Ureteral calculi 2018   Vaginal atrophy 09/14/2022   Vaginal odor 06/29/2022   Vitamin D  deficiency 10/06/2018    Patient Active Problem List   Diagnosis Date Noted   Apical variant hypertrophic cardiomyopathy (HCC) 04/28/2024   Orthostatic hypotension 04/28/2024   Cardiomyopathy (HCC) 01/23/2024   Syncope 01/22/2024   Chronic health problem 01/22/2024   Insomnia due to drug (HCC) 11/08/2023   Bronchiectasis without complication (HCC) 08/07/2023   Elevated serum immunoglobulin free light chain level 06/04/2023   Mixed headache 05/31/2023   Abnormal PET scan of lung 04/12/2023   Abnormal SPEP 04/12/2023   Chronic anticoagulation 01/04/2023   Nausea with vomiting, unspecified 08/13/2022    Generalized anxiety disorder 04/20/2022   Prediabetes 04/12/2022   Left thyroid  nodule 04/12/2022   Secondary adrenal insufficiency (HCC) 01/16/2022   DJD (degenerative joint disease) of knee 11/27/2021   Hyperlipidemia 11/23/2021   Beta thalassemia minor 03/07/2021   Anemia 03/05/2021   Postmenopause 10/21/2020   Lumbar nerve root impingement 10/04/2020   OSA (obstructive sleep apnea), Severe 10/04/2020   Left hemiparesis, Chronic (HCC) 05/26/2020   Myofascial pain 04/27/2020   Cardiomyopathy, hypertrophic obstructive, mid cavitary (HCC) 03/02/2020   Physical deconditioning 02/16/2020   Specific antibody deficiency response to vaccination 01/18/2020   Chronic constipation 08/04/2019   Interstitial lung disease (HCC) 04/02/2019   Systemic lupus erythematosus, organ or system involvement unspecified (HCC) 04/02/2019   Steroid-induced osteopenia 03/23/2019   SLE (systemic lupus erythematosus related syndrome) (HCC) 11/07/2018   Mild cognitive impairment 10/08/2018   Personal history of immunosupression therapy 10/08/2018   Long-term corticosteroid use 10/08/2018   Vitamin D  deficiency 10/06/2018   Chronic central neuropathic pain 10/02/2018   Lupus anticoagulant disorder (HCC) 10/02/2018   Anxiety 07/12/2018   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   Essential hypertension 09/21/2015   Mood disorder (HCC) 09/21/2015   History of transient ischemic  attack (TIA) 09/21/2015   Legal blindness 01/30/2014   Chronic GERD 03/20/2013    Past Surgical History:  Procedure Laterality Date   BREAST BIOPSY     BREAST CYST EXCISION Left pt unsure   COLONOSCOPY WITH PROPOFOL  N/A 07/26/2017   Procedure: COLONOSCOPY WITH PROPOFOL ;  Surgeon: Rollin Dover, MD;  Location: [facility];  Service: Endoscopy;  Laterality: N/A;   ESOPHAGOGASTRODUODENOSCOPY (EGD) WITH PROPOFOL  N/A 07/26/2017   Procedure: ESOPHAGOGASTRODUODENOSCOPY (EGD) WITH PROPOFOL ;  Surgeon: Rollin Dover, MD;  Location: [facility];  Service: Endoscopy;  Laterality: N/A;   PORTA CATH INSERTION     TUBAL LIGATION     TUBAL LIGATION      OB History     Gravida  3   Para  3   Term  3   Preterm      AB      Living  3      SAB      IAB      Ectopic      Multiple      Live Births  3            Home Medications    Prior to Admission medications   Medication Sig Start Date End Date Taking? Authorizing Provider  albuterol  (VENTOLIN  HFA) 108 (90 Base) MCG/ACT inhaler Inhale 2 puffs into the lungs every 6 (six) hours as needed for wheezing or shortness of breath. 01/30/24  Yes McDiarmid, Krystal BIRCH, MD  alendronate  (FOSAMAX ) 70 MG tablet Take 1 tablet (70 mg total) by mouth every Friday. 11/01/23  Yes McDiarmid, Krystal BIRCH, MD  amitriptyline  (ELAVIL ) 10 MG tablet Take 1 tablet (10 mg total) by mouth at bedtime. 01/30/24  Yes McDiarmid, Krystal BIRCH, MD  atorvastatin  (LIPITOR) 40 MG tablet Take 1 tablet (40 mg total) by mouth daily. 10/28/23  Yes McDiarmid, Krystal BIRCH, MD  azithromycin  (ZITHROMAX ) 250 MG tablet Take 1 tablet (250 mg total) by mouth daily. Take first 2 tablets together, then 1 every day until finished. 04/30/24  Yes Billy Asberry FALCON, PA-C  diltiazem  (CARDIZEM  CD) 180 MG 24 hr capsule Take 1 capsule (180 mg total) by mouth daily. 01/30/24  Yes McDiarmid, Krystal BIRCH, MD  doxycycline  (VIBRAMYCIN ) 100 MG capsule Take 1 capsule (100 mg total) by mouth 2 (two) times daily for 7 days. 04/30/24 05/07/24 Yes Billy Asberry FALCON, PA-C  DULoxetine  (CYMBALTA ) 60 MG capsule Take 1 capsule (60 mg total) by mouth daily. 01/30/24  Yes McDiarmid, Krystal BIRCH, MD  esomeprazole  (NEXIUM ) 20 MG capsule Take 1 capsule (20 mg total) by mouth daily at 12 noon. 01/30/24  Yes McDiarmid, Krystal BIRCH, MD  gabapentin  (NEURONTIN ) 300 MG capsule Take 2 capsules (600 mg total) by mouth 2 (two) times daily. 01/27/24  Yes McDiarmid, Krystal BIRCH, MD  linaclotide  (LINZESS ) 145 MCG CAPS capsule Take 1 capsule (145 mcg total) by mouth daily before breakfast. 01/30/24   Yes McDiarmid, Krystal BIRCH, MD  LORazepam  (ATIVAN ) 0.5 MG tablet Take 1 tablet (0.5 mg total) by mouth 2 (two) times daily. 12/10/23  Yes McDiarmid, Krystal BIRCH, MD  mupirocin  ointment (BACTROBAN ) 2 % Apply 1 Application topically 2 (two) times daily for 7 days. 04/30/24 05/07/24 Yes Billy Asberry FALCON, PA-C  ondansetron  (ZOFRAN -ODT) 4 MG disintegrating tablet Dissolve 1 tablet under the tongue every 8 (eight) hours as needed. 04/22/24  Yes Silver Fell A, PA  OXcarbazepine  (TRILEPTAL ) 150 MG tablet Take 2 tablets (300 mg total) by mouth 2 (two) times daily. 03/05/24  Yes  McDiarmid, Krystal BIRCH, MD  Pirfenidone  267 MG TABS Take 3 tablets by mouth in the morning, at noon, and at bedtime. 09/21/23  Yes [provider]  predniSONE  (DELTASONE ) 5 MG tablet Take 1 tablet (5 mg total) by mouth daily with breakfast. 01/30/24  Yes McDiarmid, Krystal BIRCH, MD  rivaroxaban  (XARELTO ) 10 MG TABS tablet Take 1 tablet (10 mg total) by mouth daily. 12/11/23  Yes McDiarmid, Todd D, MD  tiotropium (SPIRIVA  HANDIHALER) 18 MCG inhalation capsule Place 1 capsule (18 mcg total) into inhaler and inhale daily. 01/30/24  Yes McDiarmid, Krystal BIRCH, MD  baclofen  (LIORESAL ) 10 MG tablet 1 tablet with food or milk Orally Three times a day; Duration: 30 day(s) Patient not taking: Reported on 04/30/2024    [provider]  Cholecalciferol (VITAMIN D3) 50 MCG (2000 UT) TABS Take 2 tablets by mouth daily. Patient not taking: Reported on 04/30/2024    [provider]  oxyCODONE  (OXY IR/ROXICODONE ) 5 MG immediate release tablet Take 0.5 tablets (2.5 mg total) by mouth every 4 (four) hours as needed for severe pain (pain score 7-10). Patient not taking: Reported on 04/30/2024 01/28/24   Cleotilde Perkins, DO  RiTUXimab (RITUXAN IV) Inject 1,000 mg into the vein See admin instructions. 1,000 mg into the vein every 6 months, then two weeks later another 1,000 mg injection    [provider]  traZODone  (DESYREL ) 50 MG tablet Take 0.5-1 tablets  (25-50 mg total) by mouth at bedtime as needed for sleep. 01/30/24   McDiarmid, Krystal BIRCH, MD    Family History Family History  Problem Relation Age of Onset   Cancer Mother    Cancer Father    Lupus Sister     Social History Social History   Tobacco Use   Smoking status: Never   Smokeless tobacco: Never  Vaping Use   Vaping status: Never Used  Substance Use Topics   Alcohol use: No   Drug use: No     Allergies   Beta adrenergic blockers and Rocephin  [ceftriaxone ]   Review of Systems Review of Systems  Constitutional:  Negative for chills and fever.  HENT:  Positive for congestion. Negative for ear pain.   Eyes:  Negative for discharge and redness.  Respiratory:  Positive for cough. Negative for shortness of breath and wheezing.   Gastrointestinal:  Negative for abdominal pain, diarrhea, nausea and vomiting.  Skin:  Positive for rash.  Neurological:  Positive for headaches.     Physical Exam Triage Vital Signs ED Triage Vitals  Encounter Vitals Group     BP 04/30/24 1028 111/76     Girls Systolic BP Percentile --      Girls Diastolic BP Percentile --      Boys Systolic BP Percentile --      Boys Diastolic BP Percentile --      Pulse Rate 04/30/24 1028 (!) 104     Resp 04/30/24 1028 18     Temp 04/30/24 1028 99.5 F (37.5 C)     Temp Source 04/30/24 1028 Oral     SpO2 04/30/24 1028 93 %     Weight --      Height --      Head Circumference --      Peak Flow --      Pain Score 04/30/24 1029 0     Pain Loc --      Pain Education --      Exclude from Growth Chart --    No  data found.  Updated Vital Signs BP 111/76 (BP Location: Left Arm)   Pulse (!) 104   Temp 99.5 F (37.5 C) (Oral)   Resp 18   SpO2 93%   Visual Acuity Right Eye Distance:   Left Eye Distance:   Bilateral Distance:    Right Eye Near:   Left Eye Near:    Bilateral Near:     Physical Exam Vitals and nursing note reviewed.  Constitutional:      General: She is not in acute  distress.    Appearance: Normal appearance. She is not ill-appearing.  HENT:     Head: Normocephalic and atraumatic.     Right Ear: Tympanic membrane normal.     Left Ear: Tympanic membrane normal.     Nose: Congestion present.     Mouth/Throat:     Mouth: Mucous membranes are moist.     Pharynx: No oropharyngeal exudate or posterior oropharyngeal erythema.  Eyes:     Conjunctiva/sclera: Conjunctivae normal.  Cardiovascular:     Rate and Rhythm: Normal rate and regular rhythm.     Heart sounds: Normal heart sounds. No murmur heard. Pulmonary:     Effort: Pulmonary effort is normal. No respiratory distress.     Breath sounds: Normal breath sounds. No wheezing, rhonchi or rales.  Skin:    General: Skin is warm and dry.     Comments: Cluster of mildly erythematous honey crusted lesions below nose  Neurological:     Mental Status: She is alert.  Psychiatric:        Mood and Affect: Mood normal.        Thought Content: Thought content normal.      UC Treatments / Results  Labs (all labs ordered are listed, but only abnormal results are displayed) Labs Reviewed  POC COVID19/FLU A&B COMBO - Normal    EKG   Radiology DG Chest 2 View Result Date: 04/30/2024 CLINICAL DATA:  Cough. EXAM: CHEST - 2 VIEW COMPARISON:  04/22/2024. FINDINGS: Overpenetrated radiographs, which limits the sensitivity of this exam. The heart size and mediastinal contours are unchanged. Chronic bronchitic changes. Streaky opacity in the left mid lung appears more conspicuous compared to the prior exam and may reflect atelectasis or infiltrate. No pleural effusion or pneumothorax. No acute osseous abnormality. IMPRESSION: 1. Streaky opacity in the left mid lung appears more conspicuous compared to the prior exam and may reflect atelectasis or infiltrate. 2.  Chronic bronchitic changes. Electronically Signed   By: Harrietta Sherry M.D.   On: 04/30/2024 11:30    Procedures Procedures (including critical care  time)  Medications Ordered in UC Medications - No data to display  Initial Impression / Assessment and Plan / UC Course  I have reviewed the triage vital signs and the nursing notes.  Pertinent labs & imaging results that were available during my care of the patient were reviewed by me and considered in my medical decision making (see chart for details).    X-ray with more pronounced findings to left lung.  Will order additional antibiotics to cover pneumonia and advised follow-up if no gradual improvement or with any further concerns.  Mupirocin  ointment prescribed for suspected impetigo.  Final Clinical Impressions(s) / UC Diagnoses   Final diagnoses:  Pneumonia of left upper lobe due to infectious organism  Impetigo   Discharge Instructions   None    ED Prescriptions     Medication Sig Dispense Auth. Provider   doxycycline  (VIBRAMYCIN ) 100 MG capsule Take 1  capsule (100 mg total) by mouth 2 (two) times daily for 7 days. 14 capsule Billy Stabs F, PA-C   azithromycin  (ZITHROMAX ) 250 MG tablet Take 1 tablet (250 mg total) by mouth daily. Take first 2 tablets together, then 1 every day until finished. 6 tablet Deyvi Bonanno F, PA-C   mupirocin  ointment (BACTROBAN ) 2 % Apply 1 Application topically 2 (two) times daily for 7 days. 15 g Billy Stabs FALCON, PA-C      PDMP not reviewed this encounter.   Billy Stabs FALCON, PA-C 04/30/24 1248

## 2024-04-30 NOTE — ED Triage Notes (Signed)
 Pt reports productive cough, nasal congestion, and headache x1 week. Was originally seen at ED last week for same symptoms with episodes of SOB. Given an antibiotic, but pt reports not feeling any better. No longer having SOB episodes. Denies fevers, chills, and sore throat. No other med use for symptoms. Finished antibiotic.

## 2024-05-01 ENCOUNTER — Other Ambulatory Visit (HOSPITAL_COMMUNITY): Payer: Self-pay

## 2024-05-01 ENCOUNTER — Other Ambulatory Visit: Payer: Self-pay

## 2024-05-04 ENCOUNTER — Telehealth: Payer: Self-pay

## 2024-05-04 ENCOUNTER — Encounter: Payer: Self-pay | Admitting: Family Medicine

## 2024-05-04 ENCOUNTER — Ambulatory Visit (INDEPENDENT_AMBULATORY_CARE_PROVIDER_SITE_OTHER): Admitting: Family Medicine

## 2024-05-04 ENCOUNTER — Other Ambulatory Visit (HOSPITAL_COMMUNITY): Payer: Self-pay

## 2024-05-04 VITALS — BP 118/74 | HR 106 | Temp 98.2°F | Ht 61.0 in | Wt 161.2 lb

## 2024-05-04 DIAGNOSIS — J188 Other pneumonia, unspecified organism: Secondary | ICD-10-CM | POA: Diagnosis not present

## 2024-05-04 MED ORDER — PREDNISONE 10 MG (21) PO TBPK
ORAL_TABLET | ORAL | 0 refills | Status: DC
  Filled 2024-05-04: qty 21, fill #0
  Filled 2024-05-04: qty 21, 6d supply, fill #0

## 2024-05-04 NOTE — Progress Notes (Signed)
    SUBJECTIVE:   CHIEF COMPLAINT / HPI:   PNA -Diagnosed with PNA a couple weeks ago -Was seen in ED, UC  -Overall does not feel much improved -Continues to have runny nose, SOB, nausea, HA , no vomit -Coughing up mucus, no blood  -No fever  -Currently taking doxycycline  course  -Using albuterol  multiple times a day   PERTINENT  PMH / PSH: SLE on rituximab, HTN, HCOM,   OBJECTIVE:   BP 118/74   Pulse (!) 106   Temp 98.2 F (36.8 C)   Ht 5' 1 (1.549 m)   Wt 161 lb 3.2 oz (73.1 kg)   SpO2 96%   BMI 30.46 kg/m   General: No acute distress Neck: No cervical lymphadenopathy.  CV: S1/S2 with more prominent S1. Warm and well-perfused. Pulm: No respiratory distress. CTAB. Decreased air movement throughout though no focal findings. No increased WOB. Abd: Soft, non-tender, non-distended. Skin:  Warm, dry. Psych: Pleasant and appropriate.    ASSESSMENT/PLAN:   Assessment & Plan Other pneumonia, unspecified organism Patient afebrile with reassuring lung exam today. Recent CXR 8/21 with LML opacity. Previously completed levaquin  and predisone, recently completed Augmentin , now on doxycyline course only.  -Discussed potentially longer bout of illness given immunosuppression  -Complete doxycycline  course as prescribed previously  -Start prednisone  taper x 6 days  -BMP, CBC today    RTC in 1 week if not improving.   Damien Cassis, MD [facility]

## 2024-05-04 NOTE — Telephone Encounter (Signed)
 Reviewed and agree.

## 2024-05-04 NOTE — Telephone Encounter (Signed)
 Aprils, patients niece, calls nurse line requesting a same day apt.   She reports the patient was diagnosed with PNA last week, however she is showing no improvements.   She reports she was given multiple antibiotics, however when she spoke to her this morning she stated, I just feel awful.   April reports she sounded weak on the phone. She is unsure if she has a fever or any other symptoms. She reports she has concerns for dehydration and malnutrition as patient reported to her she has not had an appetite in several days.   Patient scheduled for this morning in [facility].   However, advised to take her to ED if she is ill appearing.   April agreed with plan.

## 2024-05-04 NOTE — Patient Instructions (Signed)
 Thank you for visiting clinic today and allowing us  to participate in your care!  We sent for a steroid taper and are checking in on some labwork today. Please finish taking your antibiotics as prescribed.   If you are not feeling better in the next week, please schedule an appointment with your PCP as needed.   Reach out any time with any questions or concerns you may have - we are here for you!  Damien Cassis, MD [facility] [phone]

## 2024-05-05 ENCOUNTER — Ambulatory Visit: Admitting: Genetic Counselor

## 2024-05-07 ENCOUNTER — Encounter: Payer: Self-pay | Admitting: Family Medicine

## 2024-05-07 ENCOUNTER — Ambulatory Visit (INDEPENDENT_AMBULATORY_CARE_PROVIDER_SITE_OTHER): Admitting: Family Medicine

## 2024-05-07 VITALS — BP 129/60 | HR 80 | Ht 61.0 in | Wt 163.2 lb

## 2024-05-07 DIAGNOSIS — L820 Inflamed seborrheic keratosis: Secondary | ICD-10-CM | POA: Diagnosis not present

## 2024-05-07 DIAGNOSIS — M8580 Other specified disorders of bone density and structure, unspecified site: Secondary | ICD-10-CM

## 2024-05-07 DIAGNOSIS — L858 Other specified epidermal thickening: Secondary | ICD-10-CM

## 2024-05-07 DIAGNOSIS — L918 Other hypertrophic disorders of the skin: Secondary | ICD-10-CM | POA: Diagnosis not present

## 2024-05-07 DIAGNOSIS — D84821 Immunodeficiency due to drugs: Secondary | ICD-10-CM

## 2024-05-07 DIAGNOSIS — R634 Abnormal weight loss: Secondary | ICD-10-CM | POA: Insufficient documentation

## 2024-05-07 DIAGNOSIS — Z79899 Other long term (current) drug therapy: Secondary | ICD-10-CM | POA: Diagnosis not present

## 2024-05-07 DIAGNOSIS — L989 Disorder of the skin and subcutaneous tissue, unspecified: Secondary | ICD-10-CM | POA: Insufficient documentation

## 2024-05-07 HISTORY — DX: Immunodeficiency due to drugs: D84.821

## 2024-05-07 NOTE — Assessment & Plan Note (Addendum)
 Established problem with progression  Multiple irritated skin tags and seborrheic keratoses Irritated skin tags and seborrheic keratoses on neck, under breasts, armpits, and back causing pruritus and irritation. Cutaneous horn requires evaluation for precancerous changes. Lesions unlikely to resolve without intervention. - Refer to dermatology clinic for evaluation and removal of skin tags and cutaneous horns. - Prioritize removal of neck and under-breast lesions first due to irritation. - Dermatology referral for facial lesions to minimize scarring

## 2024-05-07 NOTE — Assessment & Plan Note (Signed)
 Established problem Progressive decline 15 pound loss over last 6 months Patient attributes to intercurrent illnesses.  She feels like her appetite is returning.  Comprehensive Metabolic Panel, CBC TSH recently at [facility] were unremarkable  CTA Chest 04/22/24 with no acute processes. Pap 01/2022 within normal limits, MM 09/2022 BiRads-1 Transthoracic echocardiogram 05/25 EF70-75% with severe apical and septal hypertrophy, G!dd, >50% IVC resp var.   WEight up 3# from last week.  Patient feeling better with resolution of cough.  Will monitor for now with encourgement to drink one Ensure daily.

## 2024-05-07 NOTE — Patient Instructions (Addendum)
 I believe your have several different types of skin lesions: 1) Skin Tags around neck, under breasts and in arm pits 2) Seborrheic keratoses on your face, back and feet  3) Cutaneous horn on your right arm  Recommended plan 1) Refer you to our office's skin clinic to work on your skin tags and your arm's cutaneous horn   2) Referral to dermatology for seborrheic keratoses on your face   It has been 5 years since the last check of your bones' density. A referral was sent to [facility] for a bone density testing.   Try to drink an Ensure most days.  Dr Avory Rahimi would like to see you back in three months to make sure you are not losing any more weight.

## 2024-05-07 NOTE — Progress Notes (Signed)
 AMBULATORY WALK 89% 98BPM

## 2024-05-07 NOTE — Assessment & Plan Note (Addendum)
 Established problem MSurveillance DEXA for progression to osteoporosis on chronic corticosteroid Continue Vitamin D  and calcium  supplement.  Encouraged drinking a nutritional drink supplement once a day

## 2024-05-07 NOTE — Progress Notes (Addendum)
 Andrea Berry is accompanied by friend Sources of clinical information for visit is/are patient. Nursing assessment for this office visit was reviewed with the patient for accuracy and revision.   Previous Report(s) Reviewed: [facility] labs and 04/22/24 [facility]A Chest report.    05/04/2024   11:23 AM  Depression screen PHQ 2/9  Decreased Interest 3  Down, Depressed, Hopeless 0  PHQ - 2 Score 3  Altered sleeping 2  Tired, decreased energy 3  Change in appetite 2  Feeling bad or failure about yourself  0  Trouble concentrating 0  Moving slowly or fidgety/restless 0  Suicidal thoughts 0  PHQ-9 Score 10  Difficult doing work/chores Somewhat difficult   Flowsheet Row Office Visit from 05/04/2024 in [facility] - [facility] Dept Of [place]. [facility] Office Visit from 01/28/2024 in [facility] - [facility] Of [place] [facility] OTHER RESPIRATORY from 12/19/2023 in [facility]  Thoughts that you would be better off dead, or of hurting yourself in some way Not at all Not at all Not at all  PHQ-9 Total Score 10 0 0       05/07/2024    8:41 AM 04/17/2024    1:22 PM 01/28/2024    1:21 PM 12/24/2023   10:34 AM 12/19/2023   10:29 AM  Fall Risk   Falls in the past year? 1 0 1 0 0  Number falls in past yr: 0 0  0 0  Injury with Fall? 1 0  0 0  Risk for fall due to :    History of fall(s);Impaired balance/gait;Impaired vision History of fall(s);Impaired balance/gait;Impaired vision  Follow up    Falls evaluation completed;Falls prevention discussed Falls evaluation completed;Falls prevention discussed       05/04/2024   11:23 AM 01/28/2024    1:20 PM 12/19/2023   11:57 AM  PHQ9 SCORE ONLY  PHQ-9 Total Score 10 0  0      Data saved with a previous flowsheet row definition    There are no preventive care reminders to display for this patient.  Health Maintenance Due  Topic Date  Due   Hepatitis B Vaccines 19-59 Average Risk (1 of 3 - 19+ 3-dose series) Never done   Medicare Annual Wellness (AWV)  03/28/2024   COVID-19 Vaccine (7 - Pfizer risk 2024-25 season) 05/06/2024   INFLUENZA VACCINE  04/10/2024      History/P.E. limitations: none  History of Present Illness Andrea Berry is a 58 year old female who presents with irritated moles and skin tags.  She has been experiencing moles breaking out all over her body, which are very irritated and itchy. The moles are located under her breasts, armpits, neck, and back, causing difficulty in wearing a bra or deodorant and irritation with certain clothing. The moles are particularly bothersome when wearing shirts that touch her neck.   There is a hard skin tag on right arm.   She has been experiencing weight loss, which she attributes to being sick, and is trying to gain weight by consuming more Ensure.  She has not had a bone density scan in five years.  Physical Exam SKIN: Several irritated Seborrheic keratosis on back and left axilla. Numerous irritated skin tags under breasts and on anterior neck.  Right arm with cutaneous horn 6-7 mm diameter with normal surrounding skin.     Assessment and  Plan Assessment & Plan      Skin lesions, generalized Established problem with progression  Multiple irritated skin tags and seborrheic keratoses Irritated skin tags and seborrheic keratoses on neck, under breasts, armpits, and back causing pruritus and irritation. Cutaneous horn requires evaluation for precancerous changes. Lesions unlikely to resolve without intervention. - Refer to dermatology clinic for evaluation and removal of skin tags and cutaneous horns. - Prioritize removal of neck and under-breast lesions first due to irritation. - Dermatology referral for facial lesions to minimize scarring  Steroid-induced osteopenia Established problem MSurveillance DEXA for progression to  osteoporosis on chronic corticosteroid Continue Vitamin D  and calcium  supplement.  Encouraged drinking a nutritional drink supplement once a day   Weight loss, unintentional Established problem Progressive decline 15 pound loss over last 6 months Patient attributes to intercurrent illnesses.  She feels like her appetite is returning.  Comprehensive Metabolic Panel, CBC TSH recently at [facility] were unremarkable  [facility]A Chest 04/22/24 with no acute processes. Pap 01/2022 within normal limits, MM 09/2022 BiRads-1 Transthoracic echocardiogram 05/25 EF70-75% with severe apical and septal hypertrophy, G!dd, >50% IVC resp var.   WEight up 3# from last week.  Patient feeling better with resolution of cough.  Will monitor for now with encourgement to drink one Ensure daily.

## 2024-05-13 ENCOUNTER — Other Ambulatory Visit: Payer: Self-pay

## 2024-05-14 ENCOUNTER — Other Ambulatory Visit: Payer: Self-pay

## 2024-05-15 ENCOUNTER — Other Ambulatory Visit: Payer: Self-pay

## 2024-05-25 ENCOUNTER — Other Ambulatory Visit: Payer: Self-pay

## 2024-05-25 ENCOUNTER — Other Ambulatory Visit: Payer: Self-pay | Admitting: Family Medicine

## 2024-05-26 ENCOUNTER — Other Ambulatory Visit: Payer: Self-pay | Admitting: Family Medicine

## 2024-05-26 ENCOUNTER — Other Ambulatory Visit (HOSPITAL_COMMUNITY): Payer: Self-pay

## 2024-05-26 ENCOUNTER — Other Ambulatory Visit: Payer: Self-pay

## 2024-05-26 MED ORDER — ONDANSETRON 4 MG PO TBDP
4.0000 mg | ORAL_TABLET | Freq: Three times a day (TID) | ORAL | 2 refills | Status: AC | PRN
  Filled 2024-05-26: qty 30, 10d supply, fill #0
  Filled 2024-07-21: qty 30, 10d supply, fill #1
  Filled 2024-09-14 – 2024-09-24 (×2): qty 30, 10d supply, fill #2

## 2024-05-27 NOTE — Telephone Encounter (Signed)
 Contacted the patient but she did not answer.  I did leave her a voice mail to give us  a call back.

## 2024-05-27 NOTE — Telephone Encounter (Signed)
 Please contact Ms Brailsford to see why she has requested refill of prednisone .

## 2024-05-28 ENCOUNTER — Other Ambulatory Visit (HOSPITAL_COMMUNITY): Payer: Self-pay

## 2024-05-28 ENCOUNTER — Ambulatory Visit: Payer: Self-pay | Admitting: Internal Medicine

## 2024-05-28 DIAGNOSIS — I422 Other hypertrophic cardiomyopathy: Secondary | ICD-10-CM

## 2024-05-28 DIAGNOSIS — R55 Syncope and collapse: Secondary | ICD-10-CM | POA: Diagnosis not present

## 2024-05-29 ENCOUNTER — Other Ambulatory Visit (HOSPITAL_COMMUNITY): Payer: Self-pay

## 2024-06-01 ENCOUNTER — Other Ambulatory Visit (HOSPITAL_COMMUNITY): Payer: Self-pay

## 2024-06-02 ENCOUNTER — Other Ambulatory Visit (HOSPITAL_COMMUNITY): Payer: Self-pay

## 2024-06-02 ENCOUNTER — Encounter (HOSPITAL_COMMUNITY): Payer: Self-pay

## 2024-06-03 ENCOUNTER — Other Ambulatory Visit: Payer: Self-pay

## 2024-06-03 ENCOUNTER — Other Ambulatory Visit (HOSPITAL_COMMUNITY): Payer: Self-pay

## 2024-06-03 DIAGNOSIS — M329 Systemic lupus erythematosus, unspecified: Secondary | ICD-10-CM

## 2024-06-03 DIAGNOSIS — Z7952 Long term (current) use of systemic steroids: Secondary | ICD-10-CM

## 2024-06-03 NOTE — Telephone Encounter (Signed)
 Received call from April, patient's caregiver regarding rx refill on prednisone  5 mg. She reports that patient takes this as maintenance medication for Lupus.   Previously prescribed by provider in [place], however, April reports that medication keeps being denied. She states that she has not been able to reach anyone from this office regarding reason for denial.   She states that before this provider was prescribing, Dr. McDiarmid was writing the prescription.   She is asking if PCP can restart the medication.   Pended to encounter.   Please advise.   Chiquita JAYSON English, RN

## 2024-06-04 ENCOUNTER — Other Ambulatory Visit: Payer: Self-pay

## 2024-06-04 ENCOUNTER — Other Ambulatory Visit (HOSPITAL_COMMUNITY): Payer: Self-pay

## 2024-06-04 MED ORDER — PREDNISONE 5 MG PO TABS
5.0000 mg | ORAL_TABLET | Freq: Every day | ORAL | 1 refills | Status: DC
Start: 2024-06-04 — End: 2024-06-29
  Filled 2024-06-04: qty 90, 90d supply, fill #0

## 2024-06-05 ENCOUNTER — Other Ambulatory Visit (HOSPITAL_COMMUNITY): Payer: Self-pay

## 2024-06-08 ENCOUNTER — Other Ambulatory Visit (HOSPITAL_COMMUNITY): Payer: Self-pay

## 2024-06-08 ENCOUNTER — Other Ambulatory Visit: Payer: Self-pay | Admitting: Family Medicine

## 2024-06-08 ENCOUNTER — Other Ambulatory Visit: Payer: Self-pay

## 2024-06-08 MED ORDER — AZITHROMYCIN 250 MG PO TABS
250.0000 mg | ORAL_TABLET | Freq: Every day | ORAL | 1 refills | Status: DC
  Filled 2024-06-08: qty 90, 90d supply, fill #0

## 2024-06-09 ENCOUNTER — Other Ambulatory Visit: Payer: Self-pay

## 2024-06-10 ENCOUNTER — Other Ambulatory Visit: Payer: Self-pay | Admitting: Family Medicine

## 2024-06-10 ENCOUNTER — Other Ambulatory Visit: Payer: Self-pay

## 2024-06-10 ENCOUNTER — Other Ambulatory Visit (HOSPITAL_COMMUNITY): Payer: Self-pay

## 2024-06-10 DIAGNOSIS — K5909 Other constipation: Secondary | ICD-10-CM

## 2024-06-10 MED ORDER — ATORVASTATIN CALCIUM 40 MG PO TABS
40.0000 mg | ORAL_TABLET | Freq: Every day | ORAL | 3 refills | Status: AC
  Filled 2024-06-10: qty 90, 90d supply, fill #0
  Filled 2024-06-16: qty 30, 30d supply, fill #0
  Filled 2024-07-20 (×2): qty 30, 30d supply, fill #1
  Filled 2024-08-12: qty 30, 30d supply, fill #2
  Filled 2024-09-15: qty 30, 30d supply, fill #3
  Filled 2024-10-14: qty 30, 30d supply, fill #4

## 2024-06-16 ENCOUNTER — Other Ambulatory Visit: Payer: Self-pay

## 2024-06-16 ENCOUNTER — Other Ambulatory Visit (HOSPITAL_COMMUNITY): Payer: Self-pay

## 2024-06-17 ENCOUNTER — Other Ambulatory Visit: Payer: Self-pay

## 2024-06-18 ENCOUNTER — Telehealth: Payer: Self-pay | Admitting: Family Medicine

## 2024-06-18 ENCOUNTER — Other Ambulatory Visit: Payer: Self-pay

## 2024-06-18 NOTE — Telephone Encounter (Signed)
 Patient called stating that she had a referral placed for dermatology, but the place the referral was sent does not accept her insurance. Asks if we can send referral somewhere else please.

## 2024-06-18 NOTE — Telephone Encounter (Signed)
Routed message to PCP. Jacorie Ernsberger, CMA  

## 2024-06-24 ENCOUNTER — Other Ambulatory Visit: Payer: Self-pay | Admitting: Family Medicine

## 2024-06-24 DIAGNOSIS — Z1231 Encounter for screening mammogram for malignant neoplasm of breast: Secondary | ICD-10-CM

## 2024-06-29 ENCOUNTER — Encounter (HOSPITAL_COMMUNITY): Payer: Self-pay

## 2024-06-29 ENCOUNTER — Ambulatory Visit (HOSPITAL_COMMUNITY)
Admission: EM | Admit: 2024-06-29 | Discharge: 2024-06-29 | Disposition: A | Discharge status: Home / Self Care | Attending: Emergency Medicine | Admitting: Emergency Medicine

## 2024-06-29 ENCOUNTER — Ambulatory Visit (INDEPENDENT_AMBULATORY_CARE_PROVIDER_SITE_OTHER)

## 2024-06-29 ENCOUNTER — Other Ambulatory Visit (HOSPITAL_COMMUNITY): Payer: Self-pay

## 2024-06-29 DIAGNOSIS — J209 Acute bronchitis, unspecified: Secondary | ICD-10-CM | POA: Diagnosis not present

## 2024-06-29 DIAGNOSIS — R0602 Shortness of breath: Secondary | ICD-10-CM | POA: Diagnosis not present

## 2024-06-29 LAB — POC COVID19/FLU A&B COMBO
Covid Antigen, POC: NEGATIVE
Influenza A Antigen, POC: NEGATIVE
Influenza B Antigen, POC: NEGATIVE

## 2024-06-29 MED ORDER — PREDNISONE 20 MG PO TABS
40.0000 mg | ORAL_TABLET | Freq: Every day | ORAL | 0 refills | Status: AC
  Filled 2024-06-29: qty 10, 5d supply, fill #0

## 2024-06-29 MED ORDER — ONDANSETRON 4 MG PO TBDP
4.0000 mg | ORAL_TABLET | Freq: Three times a day (TID) | ORAL | 0 refills | Status: DC | PRN
  Filled 2024-06-29: qty 10, 4d supply, fill #0

## 2024-06-29 MED ORDER — ALBUTEROL SULFATE HFA 108 (90 BASE) MCG/ACT IN AERS
1.0000 | INHALATION_SPRAY | Freq: Four times a day (QID) | RESPIRATORY_TRACT | 0 refills | Status: DC | PRN
  Filled 2024-06-29: qty 6.7, 25d supply, fill #0

## 2024-06-29 NOTE — ED Triage Notes (Signed)
 Pt reports nausea and sob x 3 days. Patient took Nyquil last night. No other symptoms.

## 2024-06-29 NOTE — ED Provider Notes (Signed)
 [facility]    CSN: [id number] Arrival date & time: 06/29/24  9051      History   Chief Complaint Chief Complaint  Patient presents with   Shortness of Breath   Nausea   Headache    HPI Andrea Berry is a 58 y.o. female.   Patient presents with intermittent headache, nausea, and shortness of breath that began on 10/17.  Patient states that her symptoms initially began with nausea and then she developed a headache that is on and off and the shortness of breath has progressively worsened over the last few days.  Patient states that she has had some congestion and runny nose as well.  Denies cough, fever, vomiting, diarrhea, and abdominal pain.  Patient states that she did take NyQuil last night with minimal relief.  Patient denies history of asthma or COPD, reports that she has had history of recurrent pneumonia and is concerned that she may have this again.  Past medical history includes lupus, hypertension, hyperlipidemia, cardiomyopathy, stroke, and legal blindness.  The history is provided by the patient and medical records.  Shortness of Breath Associated symptoms: headaches   Headache   Past Medical History:  Diagnosis Date   Acute left hemiparesis on chronic left hemiparesis 04/12/2022   Acute respiratory distress syndrome (ARDS) due to COVID-19 virus (HCC) 03/27/2019   Acute respiratory failure with hypoxia (HCC) 02/16/2020   Anemia of chronic disease 10/28/2015   Anxiety 07/12/2018   Arthritis    Ataxia 01/03/2016   Blind    Blindness of both eyes 04/12/2022   CAP (community acquired pneumonia) 04/07/2023   Chronic central neuropathic pain 10/02/2018   Dx by [facility] 09/2017   Chronic coughing 09/10/2016   Chronic pain syndrome 07/12/2018   Chronic respiratory failure with hypoxia (HCC)    Chronic rhinitis 01/07/2019   COVID-19 02/04/2020   Decreased strength 10/08/2018   Proximal leg muscles   Depression    Depression with anxiety  01/03/2016   Devic's disease (HCC)    Dysesthesia 01/03/2016   Dyspnea on exertion 11/07/2018   Elevated BP without diagnosis of hypertension 08/28/2019   Report of [facility] nurse in December with normal SBPs but DBPs in low 90s    Eustachian tube dysfunction 11/18/2018   L>R. Followed by [facility].   Eustachian tube dysfunction, bilateral 09/18/2018   Eustachian tube dysfunction, left 11/13/2018   L>R. Followed by [facility].   Fall 05/10/2023   GERD (gastroesophageal reflux disease)    Greater trochanteric bursitis of left hip 04/27/2020   Formatting of this note might be different from the original. Added automatically from request for surgery [id number]  Formatting of this note might be different from the original. Added automatically from request for surgery [id number]   Headache(784.0)    History of adrenal insufficiency due to chronic steroid therapy 01/16/2022   History of chicken pox 10/08/2018   History of COVID-19 02/16/2020   History of CVA (cerebrovascular accident)    History of kidney stones    History of pulmonary embolism 12/10/2018   Lupus Antiphosholipid (+). Patient on secondary prophylaxis with rivaroxaban  20 mg daily long-term.     Hypertension    Hypertrophic cardiomyopathy (HCC) 03/02/2020   Hyponatremia 11/25/2015   Impairment of balance 10/08/2018   Kidney stone 09/14/2022   Legal blindness 01/30/2014   Liver masses 04/13/2022   MRI Abdomen 11/06/22 [facility]: Focus of arterial phase hyperenhancement in the right hepatic lobe  is consistent with a vascular  shunt. No evidence of hepatic mass. Cholelithiasis. No radiographic evidence of cholecystitis or biliary  ductal dilatation.      PET Scan 04/10/22 by Pulmonology for work-up of Solitary Pulmonary nodule found Multiple hypermetabolic hepatic mass lesions. Hepatic metastasis    Long-term corticosteroid use 10/08/2018   Lung nodule 05/24/2022   07/05/22 [facility] PET Scan: 1.  Interval decrease in size of a linear left  lower lobe pulmonary nodule without radiotracer uptake, favored scarring in the setting of prior infection. No new pulmonary nodule.  2.  Waxing and waning nonspecific scattered hypermetabolic nodularity throughout both lungs with decreased metabolic activity of bilateral hilar lymph nodes and stable to slightly increased hepat   Lupus    Lupus anticoagulant disorder 10/02/2018   Pulmonary Embolism 2017   Lupus cerebritis (HCC) 11/25/2015   Possible explanation of 11/25/2015 acute hyperintensity findings on Brain MRI.    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   Orthostatic dizziness, Recurrent 10/08/2018   Associated with medications   Otalgia 07/12/2018   Personal history of immunosupression therapy 10/08/2018   Chronic Azathioprine  and prednisone  therapy for Devic's disease   Pigmented skin lesion of uncertain nature 11/06/2018   Pneumonia 02/21/2020   Pneumonia of right lower lobe due to infectious organism 02/21/2020   Postmenopause 10/21/2020   Pulmonary embolism (HCC) 2017   Shortness of breath    due to medication   Skin lesion of chest wall 11/07/2018   SLE (systemic lupus erythematosus related syndrome) (HCC) 11/07/2018   Smith antibody positive 04/07/2019   Stroke (HCC) 2008   visually impaired   Stroke (HCC) 04/13/2022   Syncope    Transaminasemia 10/21/2020   Ureteral calculi 2018   Vaginal atrophy 09/14/2022   Vaginal odor 06/29/2022   Vitamin D  deficiency 10/06/2018    Patient Active Problem List   Diagnosis Date Noted   Immunosuppression due to rituximab therapy (HCC) 05/07/2024   Multiple acquired skin tags 05/07/2024   Skin lesions, generalized 05/07/2024   Weight loss, unintentional 05/07/2024   Apical variant hypertrophic cardiomyopathy (HCC) 04/28/2024   Orthostatic hypotension 04/28/2024   Cardiomyopathy (HCC) 01/23/2024   Syncope 01/22/2024   Chronic health problem 01/22/2024   Insomnia due to drug (HCC)  11/08/2023   Bronchiectasis without complication (HCC) 08/07/2023   Elevated serum immunoglobulin free light chain level 06/04/2023   Mixed headache 05/31/2023   Abnormal PET scan of lung 04/12/2023   Abnormal SPEP 04/12/2023   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   Chronic anticoagulation 01/04/2023   Nausea with vomiting, unspecified 08/13/2022   Generalized anxiety disorder 04/20/2022   Prediabetes 04/12/2022   Left thyroid  nodule 04/12/2022   History of adrenal insufficiency due to chronic steroid therapy 01/16/2022   DJD (degenerative joint disease) of knee 11/27/2021   Hyperlipidemia 11/23/2021   Beta thalassemia minor 03/07/2021   Anemia 03/05/2021   Postmenopause 10/21/2020   Lumbar nerve root impingement 10/04/2020   OSA (obstructive sleep apnea), Severe 10/04/2020   Left hemiparesis, Chronic (HCC) 05/26/2020   Myofascial pain 04/27/2020   Cardiomyopathy, hypertrophic obstructive, mid cavitary (HCC) 03/02/2020   Physical deconditioning 02/16/2020   Specific antibody deficiency response to vaccination 01/18/2020   Chronic constipation 08/04/2019   Interstitial lung disease (HCC) 04/02/2019   Systemic lupus erythematosus, organ or system involvement unspecified (HCC) 04/02/2019   Steroid-induced osteopenia 03/23/2019   SLE (systemic lupus erythematosus related syndrome) (HCC) 11/07/2018   Mild cognitive impairment 10/08/2018   Personal history of immunosupression therapy 10/08/2018  Long-term corticosteroid use 10/08/2018   Vitamin D  deficiency 10/06/2018   Chronic central neuropathic pain 10/02/2018   Lupus anticoagulant disorder 10/02/2018   Anxiety 07/12/2018   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   Essential hypertension 09/21/2015   Mood disorder 09/21/2015   History of transient ischemic attack (TIA) 09/21/2015   Legal blindness 01/30/2014   Chronic GERD 03/20/2013    Past Surgical History:  Procedure Laterality Date   BREAST BIOPSY      BREAST CYST EXCISION Left pt unsure   COLONOSCOPY WITH PROPOFOL  N/A 07/26/2017   Procedure: COLONOSCOPY WITH PROPOFOL ;  Surgeon: Rollin Dover, MD;  Location: [facility];  Service: Endoscopy;  Laterality: N/A;   ESOPHAGOGASTRODUODENOSCOPY (EGD) WITH PROPOFOL  N/A 07/26/2017   Procedure: ESOPHAGOGASTRODUODENOSCOPY (EGD) WITH PROPOFOL ;  Surgeon: Rollin Dover, MD;  Location: [facility];  Service: Endoscopy;  Laterality: N/A;   EUS  01/24/2024   [facility]   PORTA CATH INSERTION     TUBAL LIGATION     TUBAL LIGATION     US  TRANSRECTAL LIMITED N/A 01/24/2024   with biopsy by [facility]    OB History     Gravida  3   Para  3   Term  3   Preterm      AB      Living  3      SAB      IAB      Ectopic      Multiple      Live Births  3            Home Medications    Prior to Admission medications   Medication Sig Start Date End Date Taking? Authorizing Provider  albuterol  (VENTOLIN  HFA) 108 (90 Base) MCG/ACT inhaler Inhale 1-2 puffs into the lungs every 6 (six) hours as needed for wheezing or shortness of breath. 06/29/24  Yes Johnie, Dickey Caamano A, NP  ondansetron  (ZOFRAN -ODT) 4 MG disintegrating tablet Take 1 tablet (4 mg total) by mouth every 8 (eight) hours as needed for nausea or vomiting. 06/29/24  Yes Johnie, Johnasia Liese A, NP  predniSONE  (DELTASONE ) 20 MG tablet Take 2 tablets (40 mg total) by mouth daily for 5 days. 06/29/24 07/04/24 Yes Johnie Flaming A, NP  alendronate  (FOSAMAX ) 70 MG tablet Take 1 tablet (70 mg total) by mouth every Friday. 11/01/23   McDiarmid, Krystal BIRCH, MD  amitriptyline  (ELAVIL ) 10 MG tablet Take 1 tablet (10 mg total) by mouth at bedtime. 01/30/24   McDiarmid, Krystal BIRCH, MD  atorvastatin  (LIPITOR) 40 MG tablet Take 1 tablet (40 mg total) by mouth daily. 06/10/24   McDiarmid, Krystal BIRCH, MD  Cholecalciferol (VITAMIN D3) 50 MCG (2000 UT) TABS Take 2 tablets by mouth daily. Patient not taking: Reported on 04/30/2024    [provider]   diltiazem  (CARDIZEM  CD) 180 MG 24 hr capsule Take 1 capsule (180 mg total) by mouth daily. 01/30/24   McDiarmid, Krystal BIRCH, MD  DULoxetine  (CYMBALTA ) 60 MG capsule Take 1 capsule (60 mg total) by mouth daily. 01/30/24   McDiarmid, Krystal BIRCH, MD  esomeprazole  (NEXIUM ) 20 MG capsule Take 1 capsule (20 mg total) by mouth daily at 12 noon. 01/30/24   McDiarmid, Krystal BIRCH, MD  gabapentin  (NEURONTIN ) 300 MG capsule Take 2 capsules (600 mg total) by mouth 2 (two) times daily. 01/27/24   McDiarmid, Krystal BIRCH, MD  linaclotide  (LINZESS ) 145 MCG CAPS capsule Take 1 capsule (145 mcg total) by mouth daily before breakfast. 01/30/24   McDiarmid, Krystal BIRCH, MD  LORazepam  (ATIVAN ) 0.5 MG tablet Take 1 tablet (0.5 mg total) by mouth 2 (two) times daily. 12/10/23   McDiarmid, Krystal BIRCH, MD  ondansetron  (ZOFRAN -ODT) 4 MG disintegrating tablet Dissolve 1 tablet under the tongue every 8 (eight) hours as needed. 05/26/24   McDiarmid, Krystal BIRCH, MD  OXcarbazepine  (TRILEPTAL ) 150 MG tablet Take 2 tablets (300 mg total) by mouth 2 (two) times daily. 03/05/24   McDiarmid, Krystal BIRCH, MD  Pirfenidone  267 MG TABS Take 3 tablets by mouth in the morning, at noon, and at bedtime. 09/21/23   [provider]  RiTUXimab (RITUXAN IV) Inject 1,000 mg into the vein See admin instructions. 1,000 mg into the vein every 6 months, then two weeks later another 1,000 mg injection    [provider]  rivaroxaban  (XARELTO ) 10 MG TABS tablet Take 1 tablet (10 mg total) by mouth daily. 12/11/23   McDiarmid, Krystal BIRCH, MD  tiotropium (SPIRIVA  HANDIHALER) 18 MCG inhalation capsule Place 1 capsule (18 mcg total) into inhaler and inhale daily. 01/30/24   McDiarmid, Krystal BIRCH, MD  traZODone  (DESYREL ) 50 MG tablet Take 0.5-1 tablets (25-50 mg total) by mouth at bedtime as needed for sleep. 01/30/24   McDiarmid, Krystal BIRCH, MD    Family History Family History  Problem Relation Age of Onset   Cancer Mother    Cancer Father    Lupus Sister     Social History Social History    Tobacco Use   Smoking status: Never   Smokeless tobacco: Never  Vaping Use   Vaping status: Never Used  Substance Use Topics   Alcohol use: No   Drug use: No     Allergies   Beta adrenergic blockers and Rocephin  [ceftriaxone ]   Review of Systems Review of Systems  Respiratory:  Positive for shortness of breath.   Neurological:  Positive for headaches.   Per HPI  Physical Exam Triage Vital Signs ED Triage Vitals [06/29/24 1029]  Encounter Vitals Group     BP 121/69     Girls Systolic BP Percentile      Girls Diastolic BP Percentile      Boys Systolic BP Percentile      Boys Diastolic BP Percentile      Pulse Rate (!) 106     Resp 20     Temp 98.4 F (36.9 C)     Temp Source Oral     SpO2 97 %     Weight      Height      Head Circumference      Peak Flow      Pain Score      Pain Loc      Pain Education      Exclude from Growth Chart    No data found.  Updated Vital Signs BP 121/69 (BP Location: Left Arm)   Pulse (!) 106   Temp 98.4 F (36.9 C) (Oral)   Resp 20   SpO2 97%   Visual Acuity Right Eye Distance:   Left Eye Distance:   Bilateral Distance:    Right Eye Near:   Left Eye Near:    Bilateral Near:     Physical Exam Vitals and nursing note reviewed.  Constitutional:      General: She is awake. She is not in acute distress.    Appearance: Normal appearance. She is well-developed and well-groomed. She is not ill-appearing.  HENT:     Right Ear: Tympanic membrane, ear canal and external ear normal.  Left Ear: Tympanic membrane, ear canal and external ear normal.     Nose: Congestion and rhinorrhea present.     Mouth/Throat:     Mouth: Mucous membranes are moist.     Pharynx: Posterior oropharyngeal erythema present. No oropharyngeal exudate.  Cardiovascular:     Rate and Rhythm: Normal rate and regular rhythm.  Pulmonary:     Effort: Pulmonary effort is normal.     Breath sounds: Normal breath sounds.  Skin:    General: Skin is  warm and dry.  Neurological:     General: No focal deficit present.     Mental Status: She is alert and oriented to person, place, and time. Mental status is at baseline.  Psychiatric:        Behavior: Behavior is cooperative.      UC Treatments / Results  Labs (all labs ordered are listed, but only abnormal results are displayed) Labs Reviewed  POC COVID19/FLU A&B COMBO    EKG   Radiology DG Chest 2 View Result Date: 06/29/2024 EXAM: 2 VIEW(S) XRAY OF THE CHEST 06/29/2024 11:30:36 AM COMPARISON: 04/30/2024 CLINICAL HISTORY: shortness of breath, hx of pneumonia. Pt reports nausea and sob x 3 days. Patient took Nyquil last night. No other symptoms. FINDINGS: LUNGS AND PLEURA: Bibasilar opacities identified which appear similar to the previous exam corresponding to chronic brochiectasis and scarring. No pulmonary edema. No pleural effusion. No pneumothorax. HEART AND MEDIASTINUM: No acute abnormality of the cardiac and mediastinal silhouettes. BONES AND SOFT TISSUES: No acute osseous abnormality. IMPRESSION: 1. No acute cardiopulmonary pathology. 2. Unchanged bibasilar opacities corresponding to chronic bronchiectasis and scarring. Electronically signed by: Waddell Calk MD 06/29/2024 12:33 PM EDT RP Workstation: [id number]    Procedures Procedures (including critical care time)  Medications Ordered in UC Medications - No data to display  Initial Impression / Assessment and Plan / UC Course  I have reviewed the triage vital signs and the nursing notes.  Pertinent labs & imaging results that were available during my care of the patient were reviewed by me and considered in my medical decision making (see chart for details).     Patient is overall well-appearing.  Vitals are stable.  Chest x-ray ordered to rule out underlying pneumonia due to history of same.  I independently interpreted these images and there is no active cardiopulmonary disease.  Radiology report confirms  this.  Symptoms likely related to a viral bronchitis.  Prescribed short course of prednisone  for this.  Refilled albuterol  inhaler as requested.  Prescribe Zofran  as needed for nausea and vomiting.  Discussed over-the-counter medications as needed for symptoms.  Discussed follow-up and return precautions. Final Clinical Impressions(s) / UC Diagnoses   Final diagnoses:  Shortness of breath  Acute bronchitis, unspecified organism     Discharge Instructions      Your x-ray is negative for any pneumonia.  I believe your symptoms are likely related to a viral bronchitis. I have prescribed prednisone  to help with this.  Take 2 tablets once daily for 5 days. I have also refilled your albuterol  inhaler that you can use every 6 hours as needed for shortness of breath and wheezing. I have also prescribed Zofran  that you can take every 8 hours as needed for nausea and vomiting.  This will dissolve under your tongue. You can take 500 to 1000 mg of Tylenol  as needed for headache.  Do not exceed 4000 mg in a day. Follow-up with your primary care provider or return here as needed.  ED Prescriptions     Medication Sig Dispense Auth. Provider   albuterol  (VENTOLIN  HFA) 108 (90 Base) MCG/ACT inhaler Inhale 1-2 puffs into the lungs every 6 (six) hours as needed for wheezing or shortness of breath. 18 g Johnie Flaming A, NP   predniSONE  (DELTASONE ) 20 MG tablet Take 2 tablets (40 mg total) by mouth daily for 5 days. 10 tablet Johnie Flaming A, NP   ondansetron  (ZOFRAN -ODT) 4 MG disintegrating tablet Take 1 tablet (4 mg total) by mouth every 8 (eight) hours as needed for nausea or vomiting. 10 tablet Johnie Flaming A, NP      PDMP not reviewed this encounter.   Johnie Flaming A, NP 06/29/24 1348

## 2024-06-29 NOTE — Discharge Instructions (Addendum)
 Your x-ray is negative for any pneumonia.  I believe your symptoms are likely related to a viral bronchitis. I have prescribed prednisone  to help with this.  Take 2 tablets once daily for 5 days. I have also refilled your albuterol  inhaler that you can use every 6 hours as needed for shortness of breath and wheezing. I have also prescribed Zofran  that you can take every 8 hours as needed for nausea and vomiting.  This will dissolve under your tongue. You can take 500 to 1000 mg of Tylenol  as needed for headache.  Do not exceed 4000 mg in a day. Follow-up with your primary care provider or return here as needed.

## 2024-06-29 NOTE — ED Triage Notes (Signed)
 Refill on Albuterol  inhaler.

## 2024-06-30 ENCOUNTER — Ambulatory Visit: Admitting: Genetic Counselor

## 2024-07-03 ENCOUNTER — Other Ambulatory Visit: Payer: Self-pay

## 2024-07-06 ENCOUNTER — Telehealth: Payer: Self-pay

## 2024-07-06 NOTE — Telephone Encounter (Signed)
 Andrea Berry LVM on nurse line requesting an apt.   I called Andrea Berry back. She reports she went to UC over the weekend. See note  She reports she is still short of breath, weak and has no appetite. She is tolerating fluids.   She denies any worsening symptoms, just no improvement.   No nausea, vomiting, diarrhea or fevers.   Patient scheduled for tomorrow morning for evaluation.   Precautions discussed in the meantime.

## 2024-07-07 ENCOUNTER — Ambulatory Visit: Admitting: Family Medicine

## 2024-07-07 NOTE — Progress Notes (Deleted)
    SUBJECTIVE:   CHIEF COMPLAINT / HPI:   Visited UC 10/20 and was treated for acute bronchitis with prednisone . Covid/flu negative at that time. CXR with nothing acute, showed unchanged bibasilar bronchiectasis/scarring Reports she still has persistent symptoms including weakness, SOB, low appetite Does not feel like symptoms have worsened, just not improved Denies N/V/D, fevers   Discussed the use of AI scribe software for clinical note transcription with the patient, who gave verbal consent to proceed.  History of Present Illness      PERTINENT  PMH / PSH: ***HTN, cardiomyopathy, OSA, ILD, lupus, immunosuppression on rituximab  OBJECTIVE:   There were no vitals taken for this visit.   Physical Exam   ***  ASSESSMENT/PLAN:   Assessment and Plan Assessment & Plan     ***  Assessment & Plan    Andrea Coward, MD [facility]

## 2024-07-08 ENCOUNTER — Other Ambulatory Visit: Payer: Self-pay

## 2024-07-09 ENCOUNTER — Encounter (HOSPITAL_COMMUNITY): Payer: Self-pay | Admitting: Internal Medicine

## 2024-07-09 ENCOUNTER — Ambulatory Visit

## 2024-07-10 ENCOUNTER — Ambulatory Visit (INDEPENDENT_AMBULATORY_CARE_PROVIDER_SITE_OTHER): Admitting: Family Medicine

## 2024-07-10 ENCOUNTER — Other Ambulatory Visit (HOSPITAL_COMMUNITY): Payer: Self-pay

## 2024-07-10 ENCOUNTER — Other Ambulatory Visit: Payer: Self-pay

## 2024-07-10 ENCOUNTER — Encounter: Payer: Self-pay | Admitting: Family Medicine

## 2024-07-10 VITALS — BP 97/80 | HR 95 | Ht 61.0 in | Wt 156.5 lb

## 2024-07-10 DIAGNOSIS — B9689 Other specified bacterial agents as the cause of diseases classified elsewhere: Secondary | ICD-10-CM

## 2024-07-10 DIAGNOSIS — J208 Acute bronchitis due to other specified organisms: Secondary | ICD-10-CM | POA: Diagnosis not present

## 2024-07-10 DIAGNOSIS — J189 Pneumonia, unspecified organism: Secondary | ICD-10-CM

## 2024-07-10 MED ORDER — DOXYCYCLINE HYCLATE 100 MG PO TABS
100.0000 mg | ORAL_TABLET | Freq: Two times a day (BID) | ORAL | 0 refills | Status: AC
  Filled 2024-07-10: qty 28, 14d supply, fill #0

## 2024-07-10 MED ORDER — DOXYCYCLINE HYCLATE 100 MG PO TABS
100.0000 mg | ORAL_TABLET | Freq: Two times a day (BID) | ORAL | 0 refills | Status: DC
  Filled 2024-07-10: qty 14, 7d supply, fill #0

## 2024-07-10 NOTE — Patient Instructions (Signed)
  VISIT SUMMARY: You came in today because of persistent respiratory symptoms, including shortness of breath and a runny nose, which have been ongoing for almost two weeks. You also reported nausea and headaches. Given your lupus and immunosuppressed status, we are treating you for presumed pneumonia.  YOUR PLAN: PRESUMED PNEUMONIA: You have been diagnosed with presumed pneumonia due to your symptoms and increased risk from immunosuppression. -Start taking doxycycline  as prescribed. -Continue using your Spiriva  and albuterol  inhalers as directed. -Seek urgent medical attention if your symptoms worsen or do not improve with antibiotics.   WORK ABSENCE: You have been out of work due to your illness. -A work note has been provided for October 29th, 30th, and 31st.                      Contains text generated by [employer].                                 Contains text generated by [employer].

## 2024-07-10 NOTE — Progress Notes (Signed)
    SUBJECTIVE:   CHIEF COMPLAINT / HPI:   Visited UC 10/20 and was treated for acute bronchitis with prednisone . Covid/flu negative at that time. CXR with nothing acute, showed unchanged bibasilar bronchiectasis/scarring Reports she still has persistent symptoms including weakness, SOB, low appetite Does not feel like symptoms have worsened, just not improved Denies N/V/D, fevers   Discussed the use of AI scribe software for clinical note transcription with the patient, who gave verbal consent to proceed.  History of Present Illness Trinia Georgi is a 58 year old female with lupus and immunosuppression who presents with persistent respiratory symptoms.  Respiratory symptoms - Persistent shortness of breath for almost two weeks, intermittent and sometimes described as 'rough' - Increased use of Spiriva  and albuterol  inhalers due to symptoms - Runny nose present for almost two weeks - No fever or pain elsewhere in the body  Constitutional and neurological symptoms - Nausea present for almost two weeks - Headaches present for almost two weeks - No vomiting  Immunosuppression and medication use - On rituximab for lupus - Immunosuppressed status - Out of work for three days due to symptoms - Allergic to ceftriaxone  and beta blockers  Treatment response - Over-the-counter medications such as Nyquil have not provided relief     PERTINENT  PMH / PSH: HTN, cardiomyopathy, OSA, ILD, lupus, immunosuppression on rituximab  OBJECTIVE:   BP 97/80   Pulse 95   Ht 5' 1 (1.549 m)   Wt 156 lb 8 oz (71 kg)   SpO2 97%   BMI 29.57 kg/m    General: NAD, pleasant, able to participate in exam Cardiac: RRR, no murmurs auscultated Respiratory: Faint rhonchi bilaterally but otherwise CTAB, normal WOB Abdomen: soft, non-tender, non-distended, normoactive bowel sounds Extremities: warm and well perfused, no edema or cyanosis Skin: warm and dry, no rashes noted Neuro:  alert, no obvious focal deficits, speech normal Psych: Normal affect and mood  ASSESSMENT/PLAN:    Assessment & Plan Acute bacterial bronchitis Community acquired pneumonia, unspecified laterality In the setting of chronic bronchiectasis Rx doxycycline  twice daily x 14 days Continue other medications as prescribed Discussed supportive care and return precautions   Payton Coward, MD [facility]

## 2024-07-20 ENCOUNTER — Other Ambulatory Visit: Payer: Self-pay

## 2024-07-21 ENCOUNTER — Other Ambulatory Visit: Payer: Self-pay

## 2024-07-21 ENCOUNTER — Other Ambulatory Visit (HOSPITAL_COMMUNITY): Payer: Self-pay

## 2024-07-21 ENCOUNTER — Other Ambulatory Visit: Payer: Self-pay | Admitting: Family Medicine

## 2024-07-22 ENCOUNTER — Other Ambulatory Visit (HOSPITAL_COMMUNITY): Payer: Self-pay

## 2024-07-22 ENCOUNTER — Other Ambulatory Visit: Payer: Self-pay

## 2024-07-22 MED ORDER — LORAZEPAM 0.5 MG PO TABS
0.5000 mg | ORAL_TABLET | Freq: Two times a day (BID) | ORAL | 2 refills | Status: AC
  Filled 2024-07-22: qty 40, 20d supply, fill #0
  Filled 2024-09-14 – 2024-09-24 (×2): qty 40, 20d supply, fill #1

## 2024-07-23 ENCOUNTER — Other Ambulatory Visit: Payer: Self-pay

## 2024-07-24 ENCOUNTER — Other Ambulatory Visit: Payer: Self-pay

## 2024-07-29 ENCOUNTER — Ambulatory Visit: Attending: Internal Medicine | Admitting: Internal Medicine

## 2024-07-29 VITALS — BP 120/79 | HR 81 | Ht 61.0 in | Wt 159.0 lb

## 2024-07-29 DIAGNOSIS — I10 Essential (primary) hypertension: Secondary | ICD-10-CM | POA: Diagnosis not present

## 2024-07-29 DIAGNOSIS — I422 Other hypertrophic cardiomyopathy: Secondary | ICD-10-CM

## 2024-07-29 DIAGNOSIS — J849 Interstitial pulmonary disease, unspecified: Secondary | ICD-10-CM | POA: Diagnosis not present

## 2024-07-29 LAB — HEMOGLOBIN AND HEMATOCRIT, BLOOD
Hematocrit: 35.6 % (ref 34.0–46.6)
Hemoglobin: 10.3 g/dL — ABNORMAL LOW (ref 11.1–15.9)

## 2024-07-29 NOTE — Patient Instructions (Addendum)
 Medication Instructions:   Your physician recommends that you continue on your current medications as directed. Please refer to the Current Medication list given to you today.   *If you need a refill on your cardiac medications before your next appointment, please call your pharmacy*  Lab Work:   PLEASE GO DOWN STAIRS  [facility]  FIRST FLOOR   ( GET OFF ELEVATORS WALK TOWARDS WAITING AREA LAB LOCATED BY PHARMACY):  hemoglobin and hematocrit     If you have labs (blood work) drawn today and your tests are completely normal, you will receive your results only by: MyChart Message (if you have MyChart) OR A paper copy in the mail If you have any lab test that is abnormal or we need to change your treatment, we will call you to review the results.  Testing/Procedures: Your physician has requested that you have a cardiac MRI. Cardiac MRI uses a computer to create images of your heart as its beating, producing both still and moving pictures of your heart and major blood vessels. For further information please visit [email or web address]. Please follow the instruction sheet given to you today for more information.    Follow-Up: At [facility], you and your health needs are our priority.  As part of our continuing mission to provide you with exceptional heart care, our providers are all part of one team.  This team includes your primary Cardiologist (physician) and Advanced Practice Providers or APPs (Physician Assistants and Nurse Practitioners) who all work together to provide you with the care you need, when you need it.  Your next appointment:   6 month(s) Chandrasekar  / Orren Fabry   Provider:   We recommend signing up for the patient portal called MyChart.  Sign up information is provided on this After Visit Summary.  MyChart is used to connect with patients for Virtual Visits (Telemedicine).  Patients are able to view lab/test results, encounter notes, upcoming appointments, etc.   Non-urgent messages can be sent to your provider as well.   To learn more about what you can do with MyChart, go to [email or web address].   Other Instructions

## 2024-07-29 NOTE — Progress Notes (Signed)
 Cardiology Office Note:  .    Date:  07/29/2024  ID:  Andrea Berry, DOB [birth year], MRN [id number] PCP: Andrea Berry BIRCH, MD  [facility] Providers Cardiologist:  None     CC: Apical HCM f/u   History of Present Illness: .    Andrea Berry is a 57 y.o. female  with apical hypertrophic cardiomyopathy who presents with syncope and HCM care.  Ms. Andrea Berry, with apical hypertrophic cardiomyopathy, presents for follow-up of her condition and evaluation of prior syncope  She has a history of apical hypertrophic cardiomyopathy with prior syncopal episodes. Her last echocardiogram showed apical hypertrophic cardiomyopathy with only rare non-sustained ventricular tachycardia. She has experienced two syncopal episodes; the first occurred while walking and the second while reaching for a water bottle. No further episodes of syncope have occurred since then.  A cardiac MRI was ordered but has not yet been performed.   She is under consideration for genetic testing related to her condition but has not yet met with the specialist to proceed with this testing.  She has three sons, one living in [place] , one in [place], and the youngest living with her.  No recent episodes of syncope.  Discussed the use of AI scribe software for clinical note transcription with the patient, who gave verbal consent to proceed.   Relevant histories: .  Social  - The patient is blind, has three sons, and grandchildren. The patient has a history of lupus, transverse myelitis, and interstitial lung disease.  She comes with her adoptive family. ROS: As per HPI.   Studies Reviewed: .     Cardiac Studies & Procedures   ______________________________________________________________________________________________     ECHOCARDIOGRAM  ECHOCARDIOGRAM COMPLETE 01/22/2024  Narrative ECHOCARDIOGRAM REPORT    Patient Name:   Andrea Berry Date of Exam: 01/22/2024 Medical Rec #:   [id number]          Height:       61.0 in Accession #:    [id number]         Weight:       178.1 lb Date of Birth:  21-Mar-1966          BSA:          1.798 m Patient Age:    57 years           BP:           122/66 mmHg Patient Gender: F                  HR:           80 bpm. Exam Location:  Inpatient  Procedure: 2D Echo, Cardiac Doppler and Color Doppler (Both Spectral and Color Flow Doppler were utilized during procedure).  Indications:    Syncope  History:        Patient has no prior history of Echocardiogram examinations and Patient has prior history of Echocardiogram examinations, most recent 04/12/2022. Signs/Symptoms:Syncope.  Sonographer:    Philomena Daring Referring Phys: LAURAINE NORSE  IMPRESSIONS   1. Left ventricular ejection fraction, by estimation, is 70 to 75%. The left ventricle has hyperdynamic function. The left ventricle has no regional wall motion abnormalities. There is severe asymmetric left ventricular hypertrophy of the septal and apical segments. Left ventricular diastolic parameters are consistent with Grade I diastolic dysfunction (impaired relaxation). 2. Right ventricular systolic function is normal. The right ventricular size is normal. Tricuspid regurgitation signal is inadequate for assessing PA pressure. 3. The mitral  valve is grossly normal. Trivial mitral valve regurgitation. No evidence of mitral stenosis. 4. The aortic valve was not well visualized. Aortic valve regurgitation is mild. No aortic stenosis is present. 5. The inferior vena cava is normal in size with greater than 50% respiratory variability, suggesting right atrial pressure of 3 mmHg.  FINDINGS Left Ventricle: Left ventricular ejection fraction, by estimation, is 70 to 75%. The left ventricle has hyperdynamic function. The left ventricle has no regional wall motion abnormalities. The left ventricular internal cavity size was normal in size. There is severe asymmetric left ventricular  hypertrophy of the septal and apical segments. Left ventricular diastolic parameters are consistent with Grade I diastolic dysfunction (impaired relaxation).  Right Ventricle: The right ventricular size is normal. No increase in right ventricular wall thickness. Right ventricular systolic function is normal. Tricuspid regurgitation signal is inadequate for assessing PA pressure.  Left Atrium: Left atrial size was normal in size.  Right Atrium: Right atrial size was normal in size.  Pericardium: There is no evidence of pericardial effusion.  Mitral Valve: The mitral valve is grossly normal. Trivial mitral valve regurgitation. No evidence of mitral valve stenosis.  Tricuspid Valve: The tricuspid valve is normal in structure. Tricuspid valve regurgitation is trivial. No evidence of tricuspid stenosis.  Aortic Valve: The aortic valve was not well visualized. Aortic valve regurgitation is mild. No aortic stenosis is present. Aortic valve mean gradient measures 6.5 mmHg. Aortic valve peak gradient measures 12.8 mmHg. Aortic valve area, by VTI measures 1.92 cm.  Pulmonic Valve: The pulmonic valve was not well visualized. Pulmonic valve regurgitation is trivial. No evidence of pulmonic stenosis.  Aorta: The aortic root is normal in size and structure.  Venous: The inferior vena cava is normal in size with greater than 50% respiratory variability, suggesting right atrial pressure of 3 mmHg.  IAS/Shunts: No atrial level shunt detected by color flow Doppler.   LEFT VENTRICLE PLAX 2D LVIDd:         3.30 cm   Diastology LVIDs:         2.10 cm   LV e' medial:    5.44 cm/s LV PW:         1.00 cm   LV E/e' medial:  10.5 LV IVS:        1.60 cm   LV e' lateral:   5.98 cm/s LVOT diam:     1.60 cm   LV E/e' lateral: 9.5 LV SV:         60 LV SV Index:   33 LVOT Area:     2.01 cm   RIGHT VENTRICLE             IVC RV S prime:     16.85 cm/s  IVC diam: 1.70 cm TAPSE (M-mode): 2.0 cm  LEFT ATRIUM              Index        RIGHT ATRIUM           Index LA diam:        3.30 cm 1.84 cm/m   RA Area:     12.70 cm LA Vol (A2C):   36.3 ml 20.19 ml/m  RA Volume:   30.20 ml  16.80 ml/m LA Vol (A4C):   53.1 ml 29.53 ml/m LA Biplane Vol: 44.3 ml 24.64 ml/m AORTIC VALVE AV Area (Vmax):    1.83 cm AV Area (Vmean):   1.77 cm AV Area (VTI):     1.92 cm  AV Vmax:           179.00 cm/s AV Vmean:          116.500 cm/s AV VTI:            0.314 m AV Peak Grad:      12.8 mmHg AV Mean Grad:      6.5 mmHg LVOT Vmax:         162.50 cm/s LVOT Vmean:        102.750 cm/s LVOT VTI:          0.300 m LVOT/AV VTI ratio: 0.96  AORTA Ao Root diam: 2.50 cm Ao Asc diam:  3.10 cm  MITRAL VALVE MV Area (PHT): 19.45 cm   SHUNTS MV Decel Time: 39 msec     Systemic VTI:  0.30 m MV E velocity: 57.10 cm/s  Systemic Diam: 1.60 cm  Soyla Merck MD Electronically signed by Soyla Merck MD Signature Date/Time: 01/22/2024/3:00:01 PM    Final    MONITORS  LONG TERM MONITOR (3-14 DAYS) 05/27/2024  Narrative   Patient had a minimum heart rate of 51 bpm, maximum heart rate of 141 bpm, and average heart rate of 92 bpm. Predominant underlying rhythm was sinus rhythm. One short run of paroxsymal SVT (only 5 beats) with no symptoms. No Atrial fibrillation on NSVT in the setting of HCM. Isolated PACs were rare (<1.0%). Isolated PVCs were rare (<1.0%). No triggered and diary events.  No malignant arrhythmias.       ______________________________________________________________________________________________       Physical Exam:    VS:  BP 120/79   Pulse 81   Ht 5' 1 (1.549 m)   Wt 159 lb (72.1 kg)   SpO2 97%   BMI 30.04 kg/m    Wt Readings from Last 3 Encounters:  07/29/24 159 lb (72.1 kg)  07/10/24 156 lb 8 oz (71 kg)  05/07/24 163 lb 4 oz (74 kg)    Gen: No distress   Neck: No JVD Cardiac: No Rubs or Gallops, Diastolic Murmur, RRR +2 radial pulses Respiratory: Clear to  auscultation bilaterally, normal effort, normal respiratory rate GI: Soft, nontender, non-distended  MS: No edema;  moves all extremities Integument: Skin feels warm Neuro:  At time of evaluation, alert and oriented to person/place/time/situation; Blind Psych: Normal affect, patient feels well   ASSESSMENT AND PLAN: .    Hypertrophic Cardiomyopathy (Apical with maximal thickness 15 mm) - mild AI with no aneurysm, or resting LVOT gradient,   - without MR, Apical Aneurysm - suspicion of Fabry's/Danon/Noonan's or other mimics of HCM: low; CMR is pending still - Gene variant: Pending working to reschedule Dr. Fairy - NYHA I  - Non HCM Contributors to disease/status  ILD and HX of PE- managed by PCP; her symptoms are SOB and cough Lupus, Blindness; Transverse Myelitis- managed by different practioner; stable  Family history Reveiwed , Discussed family screening  - has three sons, is amenable to genetic testing.  Rescheduling f/u  SCD  Assessment -01/2024 visit for syncope; though to be OH;  -  2 year assessment for VT had no NSVT. - Apical hypertrophic cardiomyopathy with prior syncopal episodes. No evidence of nonsustained ventricular tachycardia or atrial fibrillation on heart monitor. Risk of sudden cardiac death in five years is less than 1%, not warranting a defibrillator using AHA SCD risk but. Potential need for defibrillator if extensive scar or apical aneurysm is present. Consideration of implantable loop recorder if extensive late gadolinium enhancement without aneurysm and  true syncopal event. Genetic testing discussed for family screening, though it may not significantly alter current management. - Ordered cardiac MRI - Will consider implantable loop recorder if MRI shows extensive late gadolinium enhancement without aneurysm and true syncopal event - Will refer to EP for defibrillator consideration if MRI shows apical aneurysm  Atrial fibrillation Assessment  - HCM-AF score  17 - Atrial arrhythmia management - No AF  Six months with me or Tessa PA-C  Longitudinal care: The evaluation and management services provided today reflect the complexity inherent in caring for this patient, including the ongoing longitudinal relationship and management of multiple chronic conditions and/or the need for care coordination. The visit required a comprehensive assessment and management plan tailored to the patient's unique needs Time was spent addressing not only the acute concerns but also the broader context of the patient's health, including preventive care, chronic disease management, and care coordination as appropriate.  Complex longitudinal is necessary for conditions including: SCD discussion with unclear syncope   Stanly Leavens, MD FASE [facility] Cardiologist [facility]  [address] [place], [address] [place], [place] [address] [phone]  4:48 PM

## 2024-07-30 ENCOUNTER — Other Ambulatory Visit: Payer: Self-pay

## 2024-07-30 ENCOUNTER — Ambulatory Visit
Admission: RE | Admit: 2024-07-30 | Discharge: 2024-07-30 | Disposition: A | Source: Ambulatory Visit | Attending: Family Medicine | Admitting: Family Medicine

## 2024-07-30 DIAGNOSIS — Z1231 Encounter for screening mammogram for malignant neoplasm of breast: Secondary | ICD-10-CM

## 2024-08-04 ENCOUNTER — Ambulatory Visit: Admitting: Family Medicine

## 2024-08-04 ENCOUNTER — Telehealth: Payer: Self-pay

## 2024-08-04 ENCOUNTER — Other Ambulatory Visit: Payer: Self-pay | Admitting: Family Medicine

## 2024-08-04 ENCOUNTER — Other Ambulatory Visit: Payer: Self-pay

## 2024-08-04 ENCOUNTER — Encounter: Payer: Self-pay | Admitting: Family Medicine

## 2024-08-04 ENCOUNTER — Other Ambulatory Visit (HOSPITAL_COMMUNITY): Payer: Self-pay

## 2024-08-04 VITALS — BP 131/78 | HR 83 | Ht 61.0 in | Wt 158.4 lb

## 2024-08-04 DIAGNOSIS — R519 Headache, unspecified: Secondary | ICD-10-CM

## 2024-08-04 DIAGNOSIS — J849 Interstitial pulmonary disease, unspecified: Secondary | ICD-10-CM

## 2024-08-04 MED ORDER — ALBUTEROL SULFATE (2.5 MG/3ML) 0.083% IN NEBU
2.5000 mg | INHALATION_SOLUTION | Freq: Four times a day (QID) | RESPIRATORY_TRACT | 1 refills | Status: AC | PRN
  Filled 2024-08-04 (×2): qty 150, 13d supply, fill #0
  Filled 2024-08-04: qty 150, 13d supply, fill #1

## 2024-08-04 MED ORDER — ALBUTEROL SULFATE (2.5 MG/3ML) 0.083% IN NEBU
2.5000 mg | INHALATION_SOLUTION | Freq: Four times a day (QID) | RESPIRATORY_TRACT | 1 refills | Status: DC | PRN
  Filled 2024-08-04: qty 150, 13d supply, fill #0

## 2024-08-04 NOTE — Patient Instructions (Addendum)
 It was wonderful to see you today.  Please bring ALL of your medications with you to every visit.   Today we talked about:  I ordered a nebulizer for you. You can pick that up at any medical supply store. The medication for your nebulizer will be at the pharmacy. I also provided a spacer. A spacer tends to work just as well as the nebulizer.   If you experience any new shortness of breath, fever, cough, or are just feeling generally unwell, please seek medical attention.   Please consider discussing your shortness of breath with your pulmonologist at the next visit.   Thank you for choosing [facility].   Please call [phone] with any questions about today's appointment.  Please arrive at least 15 minutes prior to your scheduled appointments.   If you had blood work today, I will send you a MyChart message or a letter if results are normal. Otherwise, I will give you a call.   If you had a referral placed, they will call you to set up an appointment. Please give us  a call if you don't hear back in the next 2 weeks.   If you need additional refills before your next appointment, please call your pharmacy first.   Do you need your medications delivered to your home?   We'll send your prescription to the [facility] for delivery.          Address: [address] [place], [place], [place] [address]          Phone: [phone]  Please call the [facility] to speak with a pharmacist and set up your home medication delivery. If you have any questions, feel free to contact us  -- we're happy to help!  Other [facility] that offer affordable prices on both prescriptions and over-the-counter items, as well as convenient services like vaccinations, are  [facility], at [facility]         Address:  [address], [place], [place] [address]         Phone: [phone]  [facility], located  in the [facility]        Address: [address], [place], [place] [address]        Phone: [phone]  [facility], at [facility]       Address: [address], [place], [place] [address]       Phone: [phone]  [facility], at [facility]       Address: [address] Floor, [place], [place] [address]       Phone: [phone]  You should follow up in our clinic in Return if symptoms worsen or fail to improve.  Gloriann Ogren, MD Family Medicine

## 2024-08-04 NOTE — Progress Notes (Signed)
    SUBJECTIVE:   CHIEF COMPLAINT / HPI:   Patient with history of ILD and bronchiectasis. Last PFTs done 08/2023 showed rediced dlco, pulmonology suggested daily azithroymcin and perfinidone.  Patient reports she would like to try nebulizer with her albuterol  as that seems to help when she's been in the hospital for SOB. Denies any new or changing shortness of breath and reports she is at baseline. Denies any new trouble breathing, chest pain, palpitations. Denies any fevers or recent illness.    PERTINENT  PMH / PSH:  - lupus on rituximab - hypertrophic cardiomyopathy - hx of PE - Hypertension -OSA -Chronic GERD   OBJECTIVE:   BP 131/78   Pulse 83   Ht 5' 1 (1.549 m)   Wt 158 lb 6.4 oz (71.8 kg)   SpO2 95%   BMI 29.93 kg/m   General: A&O, NAD HEENT: No sign of trauma Cardiac: RRR, systolic murmur  Respiratory: diminished breath sounds, no focal wheezing, rhonchi or rales ed  Extremities: no peripheral edema. Neuro: Normal gait, moves all four extremities appropriately. Psych: Appropriate mood and affect  ASSESSMENT/PLAN:   Assessment & Plan Interstitial lung disease (HCC) Ongoing shortness of breath likely 2/2 to ILD. No indication of acute excerebration. Patient feels she would benefit from albuterol  nebulizer. Discussed using spacer with albuterol  inhaler will provide similar benefit, she would still prefer the nebulizer. Encouraged patient to follow up with pulmonologist for ongoing ILD. - spacer provided - albuterol  nebulizer ordered - return prn  - follow up with pulmonology      Gloriann Ogren, MD [facility]

## 2024-08-04 NOTE — Telephone Encounter (Signed)
 April calls nurse line in regards to nebulizer order.   She reports they took the nebulizer order to [facility], however the copay is 40 dollars.   She prefers a cheaper at out of pocket cost, something her insurance will cover.  Will attempt to get coverage through [employer].   Community message sent.   Advised will keep them updated.

## 2024-08-04 NOTE — Assessment & Plan Note (Signed)
 Ongoing shortness of breath likely 2/2 to ILD. No indication of acute excerebration. Patient feels she would benefit from albuterol  nebulizer. Discussed using spacer with albuterol  inhaler will provide similar benefit, she would still prefer the nebulizer. Encouraged patient to follow up with pulmonologist for ongoing ILD. - spacer provided - albuterol  nebulizer ordered - return prn  - follow up with pulmonology

## 2024-08-05 ENCOUNTER — Encounter: Payer: Self-pay | Admitting: Family Medicine

## 2024-08-05 ENCOUNTER — Other Ambulatory Visit (HOSPITAL_COMMUNITY): Payer: Self-pay

## 2024-08-05 ENCOUNTER — Other Ambulatory Visit: Payer: Self-pay

## 2024-08-05 ENCOUNTER — Ambulatory Visit: Payer: Self-pay | Admitting: *Deleted

## 2024-08-05 DIAGNOSIS — J479 Bronchiectasis, uncomplicated: Secondary | ICD-10-CM

## 2024-08-05 HISTORY — DX: Bronchiectasis, uncomplicated: J47.9

## 2024-08-05 MED ORDER — AMITRIPTYLINE HCL 10 MG PO TABS
10.0000 mg | ORAL_TABLET | Freq: Every day | ORAL | 1 refills | Status: AC
  Filled 2024-08-12: qty 90, 90d supply, fill #0
  Filled 2024-09-15: qty 30, 30d supply, fill #1
  Filled 2024-10-14: qty 30, 30d supply, fill #2

## 2024-08-05 MED ORDER — ALBUTEROL SULFATE HFA 108 (90 BASE) MCG/ACT IN AERS
1.0000 | INHALATION_SPRAY | Freq: Four times a day (QID) | RESPIRATORY_TRACT | 2 refills | Status: AC | PRN
  Filled 2024-08-05: qty 6.7, 25d supply, fill #0

## 2024-08-12 ENCOUNTER — Other Ambulatory Visit: Payer: Self-pay

## 2024-08-12 ENCOUNTER — Encounter (HOSPITAL_COMMUNITY): Payer: Self-pay

## 2024-08-12 NOTE — Telephone Encounter (Signed)
 Letter of results sent to pt

## 2024-08-13 ENCOUNTER — Other Ambulatory Visit: Payer: Self-pay

## 2024-08-13 NOTE — Progress Notes (Signed)
 Allergy /Immunology Follow Up  Subjective:   Andrea Berry is a 58 y.o. female.   Referred by: Dr. Maree (Rheumatology)  Chief Complaint: follow up for possible need for immunoglobulin replacement (on longterm Rituxan)  Review of history from initial visit (07/18/23):  Andrea Berry is a 58 y.o. female with history of non-allergic rhinitis, SLE on Rituxan q6 months and azathioprine , ILD (suspected to be autoimmune in origin, follows with Dr. Nadeen), hypertrophic CM, OSA on CPAP, and mildly elevated free kappa LC (polyclonal, H/O felt non-worrisome and I agree) who presented for initial evaluation to the [facility] clinic to discuss potential need for immunoglobulin replacement in the setting of acquired absence of B cells and low IgM at the request of Dr. Maree on 07/18/23. 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 The following history was obtained:  She was previously followed by Dr. Orlan at [facility] ([facility] May 2021) and was diagnosed with specific antibody deficiency (note said low IgM, normal IgG, poor post-vaccine pneumococcal titers). She was not ever on immunoglobulin or prophylactic antibiotics from what I can tell and from what she and her son can recall.   She says she has sinus issues but it seems that she's referring to constant runny nose. She had negative allergy  testing with Dr. Orlan years ago and does not think she's ever tried Atrovent  nasal spray for vasomotor rhinitis but would like to do that. She denies frequent need for antibiotics for sinusitis.  She also says that she has lung issues and she does have know ILD. She has had extensive pulmonary evaluation including a bronchoscopy with biopsy which was unremarkable. She does deny frequently needing antibiotics for PNA as well.      CT chest 06/06/23: IMPRESSION:  1. Moderate patchy ground-glass opacity throughout both lungs  involving all lung lobes, which demonstrates a patchy centrilobular micronodular appearance in the lower lobes, worsened significantly from 12/28/2020 chest CT. Minimal new patchy peribronchovascular consolidation in the posterior left lower lobe. Mild cylindrical bronchiectasis in the bilateral lower lobes with associated bronchial wall thickening, mildly worsened. Favor a nonspecific chronic or recurrent infectious or inflammatory process such as due to recurrent aspiration, with differential including pulmonary vasculitis.  2. Dilated main pulmonary artery, suggesting pulmonary arterial hypertension.  3. Nonobstructing bilateral nephrolithiasis.  4. Aortic Atherosclerosis (ICD10-I70.0).   Post-CT, treated with doxycycline  per PCP with improvement. She presented to the ED on 10/5 with persistent symptoms and was diagnosed with likely viral respiratory infection.  CXR in ED on 06/15/23: IMPRESSION:  Persistent or recurrent medial bilateral lower lobe airspace disease which could represent atelectasis or infection.  Cardiomegaly without congestive failure.  -------------- We discussed that her recent IgG level is normal, and her IgM is low (which is to be expected in any patient on Rituxan due to elimination of B cells and has also been low for years at least according to notes from Dr. Orlan). Immunoglobulin replacement ONLY replaces IgG, and given lack of clear history of infections and normal IgG level, likely not indicated at this time (IgG level is already at goal, at least as of 05/2023).   I will repeat quants and also obtain vaccine titers. She may have specific antibody deficiency (SAD) and first line treatment for that is prophylactic antibiotics. If IgG is downtrended significantly, OR if she is having recurrent infections (particularly sinopulmonary bacterial infections), would seen approval for SCIg. ---  Labs obtained at initial visit as follows:  Component     Latest Ref Rng 07/18/2023   Tetanus Antitoxoid Antibodies, IgG     <0.10 IU/mL 1.05      Advised labs are c/w prior diagnosis of SAD and suggested prophylactic antibiotics and continued monitoring of immunoglobulin levels with anticipation for need for IVIG at some point in the near future. --- She started daily azithromycin  with Dr. Nadeen in Jan 2025 (this is an appropriate choice for SAD abx ppx as well) and is tolerating that fine. She says she has had pneumonia off and on this year but is otherwise doing fine without other infections. I have suggested repeat quants today to ensure stability (if IgG is significantly lower, would recommend immunoglobulin). She notes that she'd be unable to do home infusions, so would need to be on IVIG at an infusion center or day hospital.  Past Medical History:  Diagnosis Date  . Legally blind   . Lupus (systemic lupus erythematosus)    (CMD)   . Mild obstructive sleep apnea 03/12/2022  . 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CMD)   . Stroke    (CMD)     Current Outpatient Medications:  .  albuterol  HFA (PROVENTIL  HFA;VENTOLIN  HFA;PROAIR  HFA) 90 mcg/actuation inhaler, Inhale 2 puffs., Disp: , Rfl:  .  alendronate  (FOSAMAX ) 70 mg tablet, Take 70 mg by mouth once a week., Disp: , Rfl:  .  amitriptyline  (ELAVIL ) 10 mg tablet, Take 10 mg by mouth., Disp: , Rfl:  .  atorvastatin  (LIPITOR) 40 mg tablet, Take 40 mg by mouth daily., Disp: , Rfl:  .  AZATHIOPRINE  ORAL, 1 tablet by Other route daily., Disp: , Rfl:  .  azithromycin  (ZITHROMAX ) 250 mg tablet, Take 1 tablet (250 mg total) by mouth daily., Disp: 150 tablet, Rfl: 1 .  cholecalciferol, vitamin D3, 100 mcg (4,000 unit) tab, Take 1 tablet (4,000 Units total) by mouth daily., Disp: 90 tablet, Rfl: 4 .  dilTIAZem  (CARDIZEM  CD) 180 mg 24 hr capsule, Take 180 mg by mouth daily., Disp: , Rfl:  .  doxycycline  (VIBRA -TABS) 100 mg tablet, , Disp: , Rfl:  .  DULoxetine  (CYMBALTA ) 60 mg capsule, , Disp: , Rfl:  .  esomeprazole  (NexIUM ) 20 mg DR  capsule, Take 20 mg by mouth., Disp: , Rfl:  .  gabapentin  (NEURONTIN ) 300 mg capsule, Take 2 capsules (600 mg total) by mouth in the morning and 2 capsules (600 mg total) at noon and 2 capsules (600 mg total) in the evening., Disp: 180 capsule, Rfl: 5 .  hydrocortisone  sodium succinate , PF, (Solu-CORTEF  Act-O-Vial, PF,) 100 mg/2 mL solr, Inject 2 mL (100 mg total) into the muscle once as needed (adrenal crisis) for up to 1 dose., Disp: 2 each, Rfl: 11 .  ipratropium (ATROVENT ) 21 mcg (0.03 %) nasal spray, ADMINISTER 1-2 SPRAYS INTO EACH NOSTRIL 3 (THREE) TIMES A DAY AS NEEDED (RUNNY NOSE/POSTNASAL DRIP)., Disp: 30 mL, Rfl: 1 .  linaCLOtide  (Linzess ) 145 mcg cap capsule, Take 145 mcg by mouth daily., Disp: , Rfl:  .  LORazepam  (ATIVAN ) 0.5 mg tablet, Take 0.5 mg by mouth daily., Disp: , Rfl:  .  OXcarbazepine  (TRILEPTAL ) 150 mg tablet, TAKE 2 TABLETS (300 MG TOTAL) BY MOUTH 2 TIMES DAILY., Disp: 360 tablet, Rfl: 3 .  pirfenidone  267 mg tab, , Disp: , Rfl:  .  prednisoLONE acetate (PRED FORTE) 1 % ophthalmic suspension, , Disp: , Rfl:  .  predniSONE  (DELTASONE ) 5 mg tablet, Take 1 tablet (5 mg  total) by mouth daily. Increase dose to 10 mg or 15 mg daily if febrile, sick, or injured for sick-day dosing and return to 5 mg daily dose once symptoms improve., Disp: 200 tablet, Rfl: 4 .  promethazine  (PHENERGAN ) 12.5 mg tablet, Take 12.5 mg by mouth every 8 (eight) hours as needed., Disp: , Rfl:  .  riTUXimab (Rituxan) 10 mg/mL chemo injection, Infuse 1,000 mg into a venous catheter every 6 (six) months., Disp: , Rfl:  .  sodium chloride  (Altamist) 0.65 % nasal spray, Administer 2 sprays into affected nostril(s) 2 (two) times a day for 30 days., Disp: 30 mL, Rfl: 1 .  Spiriva  with HandiHaler 18 mcg per inhalation, , Disp: , Rfl:  .  tiZANidine  (ZANAFLEX ) 4 mg tablet, TAKE 1 TO 2 TABLETS BY MOUTH EVERY DAY AT BEDTIME, Disp: 180 tablet, Rfl: 0 .  traMADoL  (ULTRAM ) 50 mg tablet, Take 50 mg by mouth as needed.,  Disp: , Rfl:  .  traZODone  (DESYREL ) 50 mg tablet, Take 25-50 mg by mouth., Disp: , Rfl:  .  Xarelto  10 mg tablet, Take 10 mg by mouth daily., Disp: , Rfl:  Allergies  Allergen Reactions  . Beta-Blockers (Beta-Adrenergic Blocking Agts) Other (See Comments)    Diagnosis of hypertrophic cardiomyopathy  . Ceftriaxone  Other (See Comments)    Multiorgan inflammation with drug fever and multiple elevated inflammatory markers.  Resolved upon cessation of abx.    Family History: family history includes Heart failure in her sister; Lupus in her niece.  Environmental/Social History: Lives with son who is her primary caregiver.  Review of Systems: Review of systems performed with pertinent positives/negatives noted in the HPI.    Objective:   BP 121/70 (BP Location: Right arm, Patient Position: Sitting)   Pulse 97   Temp 98.1 F (36.7 C) (Temporal)   SpO2 95%   General Appearance:  Alert, cooperative, no distress, appears stated age  Head:  Normocephalic, without obvious abnormality, atraumatic  Eyes:  Conjunctiva clear, EOM's intact  Nose: Nares normal  Throat: Lips, tongue normal; teeth and gums normal  Neck: Supple, symmetrical  Lungs:   Respirations unlabored, no coughing  Heart:  Appears well perfused  Extremities: No edema  Skin: Skin color, texture, turgor normal, no rashes or lesions on visualized portions of skin  Neurologic: No gross deficits, walks with an assistive can due to vision loss   Laboratory:  Quantitative immunoglobulins  Assessment/Recommendations:    Based on today's evaluation, we would offer the following recommendations:   Hypogammaglobulinemia (low antibody levels), specific antibody deficiency: - on daily azithromycin  for prophylaxis - will repeat immunoglobulin levels today and make recommendations based on results (will either recommend continued monitoring vs antibody replacement)  Electronically signed by: Sonny Joyice Point, MD 08/13/2024  10:21 AM

## 2024-08-14 ENCOUNTER — Other Ambulatory Visit: Payer: Self-pay | Admitting: Internal Medicine

## 2024-08-14 ENCOUNTER — Ambulatory Visit (HOSPITAL_COMMUNITY)
Admission: RE | Admit: 2024-08-14 | Discharge: 2024-08-14 | Disposition: A | Source: Ambulatory Visit | Attending: Internal Medicine

## 2024-08-14 DIAGNOSIS — I422 Other hypertrophic cardiomyopathy: Secondary | ICD-10-CM

## 2024-08-14 MED ORDER — GADOBUTROL 1 MMOL/ML IV SOLN
7.0000 mL | Freq: Once | INTRAVENOUS | Status: AC | PRN
  Administered 2024-08-14: 7 mL via INTRAVENOUS

## 2024-08-17 ENCOUNTER — Ambulatory Visit

## 2024-08-17 ENCOUNTER — Encounter: Payer: Self-pay | Admitting: Family Medicine

## 2024-08-17 VITALS — Ht 61.0 in | Wt 156.0 lb

## 2024-08-17 DIAGNOSIS — Z0000 Encounter for general adult medical examination without abnormal findings: Secondary | ICD-10-CM

## 2024-08-17 NOTE — Progress Notes (Signed)
 I connected with  Rock Burnett Kerns on 08/17/24 by a audio enabled telemedicine application and verified that I am speaking with the correct person using two identifiers.  Patient Location: Home  Provider Location: Home Office  Persons Participating in Visit: Patient.  I discussed the limitations of evaluation and management by telemedicine. The patient expressed understanding and agreed to proceed.   Vital Signs: Because this visit was a virtual/telehealth visit, some criteria may be missing or patient reported. Any vitals not documented were not able to be obtained and vitals that have been documented are patient reported.     Chief Complaint  Patient presents with   Medicare Wellness    SUBSEQUENT     Subjective:   Andrea Berry is a 58 y.o. female who presents for a Medicare Annual Wellness Visit.  Visit info / Clinical Intake: Medicare Wellness Visit Type:: Subsequent Annual Wellness Visit Persons participating in visit and providing information:: patient Medicare Wellness Visit Mode:: Telephone If telephone:: video declined Since this visit was completed virtually, some vitals may be partially provided or unavailable. Missing vitals are due to the limitations of the virtual format.: Documented vitals are patient reported If Telephone or Video please confirm:: I connected with patient using audio/video enable telemedicine. I verified patient identity with two identifiers, discussed telehealth limitations, and patient agreed to proceed. Patient Location:: HOME Provider Location:: HOME OFFICE Interpreter Needed?: No Pre-visit prep was completed: yes AWV questionnaire completed by patient prior to visit?: no Living arrangements:: with family/others (SON LIVES WITH PATIENT) Patient's Overall Health Status Rating: (!) poor Typical amount of pain: some Does pain affect daily life?: (!) yes Are you currently prescribed opioids?: no  Dietary Habits and Nutritional  Risks How many meals a day?: (!) 1 (POOR APPETITE) Eats fruit and vegetables daily?: yes Most meals are obtained by: having others provide food (SON) In the last 2 weeks, have you had any of the following?: (!) unintentional weight loss; nausea, vomiting, diarrhea Diabetic:: no  Functional Status Activities of Daily Living (to include ambulation/medication): (!) Needs Assist Feeding: Independent Dressing/Grooming: Independent Bathing: Independent Toileting: Independent Transfer: Independent Ambulation: Independent with device- listed below Home Assistive Devices/Equipment: Medical Sales Representative (specify Type) (WHITE CANE DUE TO BLINDNESS) Medication Administration: Independent (MEDICATIONS PACK) Home Management (perform basic housework or laundry): Needs assistance (comment) Manage your own finances?: (!) no (SON HELPS) Primary transportation is: family / friends (SON DRIVES OR SKAT) Concerns about vision?: no *vision screening is required for WTM* (LEGALLY BLIND IN BOTH EYES) Concerns about hearing?: no  Fall Screening Falls in the past year?: 1 Number of falls in past year: 0 Was there an injury with Fall?: 0 Fall Risk Category Calculator: 1 Patient Fall Risk Level: Low Fall Risk  Fall Risk Patient at Risk for Falls Due to: Impaired balance/gait; Impaired vision Fall risk Follow up: Falls evaluation completed; Education provided  Home and Transportation Safety: All rugs have non-skid backing?: N/A, no rugs All stairs or steps have railings?: N/A, no stairs Grab bars in the bathtub or shower?: (!) no (SHOWER CHAIR) Have non-skid surface in bathtub or shower?: yes Good home lighting?: yes Regular seat belt use?: yes Hospital stays in the last year:: (!) no; yes How many hospital stays:: 1 Reason: SYNCOPE  Cognitive Assessment Difficulty concentrating, remembering, or making decisions? : yes  Advance Directives (For Healthcare) Does Patient Have a Medical Advance Directive?:  Yes Type of Advance Directive: Living will; Healthcare Power of [employer] of Healthcare Power of [place]  in Chart?: Yes - validated most recent copy scanned in chart (See row information) Copy of Living Will in Chart?: Yes - validated most recent copy scanned in chart (See row information) Would patient like information on creating a medical advance directive?: No - Patient declined  Reviewed/Updated  Reviewed/Updated: Reviewed All (Medical, Surgical, Family, Medications, Allergies, Care Teams, Patient Goals)    Allergies (verified) Beta adrenergic blockers and Rocephin  [ceftriaxone ]   Current Medications (verified) Outpatient Encounter Medications as of 08/17/2024  Medication Sig   albuterol  (PROVENTIL ) (2.5 MG/3ML) 0.083% nebulizer solution Take 3 mLs (2.5 mg total) by nebulization every 6 (six) hours as needed for wheezing or shortness of breath.   albuterol  (VENTOLIN  HFA) 108 (90 Base) MCG/ACT inhaler Inhale 1-2 puffs into the lungs every 6 (six) hours as needed for wheezing or shortness of breath.   alendronate  (FOSAMAX ) 70 MG tablet Take 1 tablet (70 mg total) by mouth every Friday.   amitriptyline  (ELAVIL ) 10 MG tablet Take 1 tablet (10 mg total) by mouth at bedtime.   atorvastatin  (LIPITOR) 40 MG tablet Take 1 tablet (40 mg total) by mouth daily.   azithromycin  (ZITHROMAX ) 250 MG tablet Take 250 mg by mouth daily.   Cholecalciferol (VITAMIN D3) 50 MCG (2000 UT) TABS Take 2 tablets by mouth daily.   diltiazem  (CARDIZEM  CD) 180 MG 24 hr capsule Take 1 capsule (180 mg total) by mouth daily.   DULoxetine  (CYMBALTA ) 60 MG capsule Take 1 capsule (60 mg total) by mouth daily.   esomeprazole  (NEXIUM ) 20 MG capsule Take 1 capsule (20 mg total) by mouth daily at 12 noon.   gabapentin  (NEURONTIN ) 300 MG capsule Take 2 capsules (600 mg total) by mouth 2 (two) times daily.   linaclotide  (LINZESS ) 145 MCG CAPS capsule Take 1 capsule (145 mcg total) by mouth daily before breakfast.    LORazepam  (ATIVAN ) 0.5 MG tablet Take 1 tablet (0.5 mg total) by mouth 2 (two) times daily.   ondansetron  (ZOFRAN -ODT) 4 MG disintegrating tablet Dissolve 1 tablet under the tongue every 8 (eight) hours as needed.   OXcarbazepine  (TRILEPTAL ) 150 MG tablet Take 2 tablets (300 mg total) by mouth 2 (two) times daily.   Pirfenidone  267 MG TABS Take 3 tablets by mouth in the morning, at noon, and at bedtime.   RiTUXimab (RITUXAN IV) Inject 1,000 mg into the vein See admin instructions. 1,000 mg into the vein every 6 months, then two weeks later another 1,000 mg injection   rivaroxaban  (XARELTO ) 10 MG TABS tablet Take 1 tablet (10 mg total) by mouth daily.   tiotropium (SPIRIVA  HANDIHALER) 18 MCG inhalation capsule Place 1 capsule (18 mcg total) into inhaler and inhale daily.   traZODone  (DESYREL ) 50 MG tablet Take 0.5-1 tablets (25-50 mg total) by mouth at bedtime as needed for sleep.   No facility-administered encounter medications on file as of 08/17/2024.    History: Past Medical History:  Diagnosis Date   Acute left hemiparesis on chronic left hemiparesis 04/12/2022   Acute respiratory distress syndrome (ARDS) due to COVID-19 virus (HCC) 03/27/2019   Acute respiratory failure with hypoxia (HCC) 02/16/2020   Anemia of chronic disease 10/28/2015   Anxiety 07/12/2018   Arthritis    Ataxia 01/03/2016   Blind    Blindness of both eyes 04/12/2022   CAP (community acquired pneumonia) 04/07/2023   Chronic central neuropathic pain 10/02/2018   [facility] 09/2017   Chronic coughing 09/10/2016   Chronic pain syndrome 07/12/2018   Chronic respiratory  failure with hypoxia (HCC)    Chronic rhinitis 01/07/2019   COVID-19 02/04/2020   Decreased strength 10/08/2018   Proximal leg muscles   Depression    Depression with anxiety 01/03/2016   Devic's disease (HCC)    Dysesthesia 01/03/2016   Dyspnea on exertion 11/07/2018   Elevated BP without diagnosis of hypertension 08/28/2019    Report of [facility] nurse in December with normal SBPs but DBPs in low 90s    Eustachian tube dysfunction 11/18/2018   L>R. Followed by [facility].   Eustachian tube dysfunction, bilateral 09/18/2018   Eustachian tube dysfunction, left 11/13/2018   L>R. Followed by [facility].   Fall 05/10/2023   GERD (gastroesophageal reflux disease)    Greater trochanteric bursitis of left hip 04/27/2020   Formatting of this note might be different from the original. Added automatically from request for surgery [id number]  Formatting of this note might be different from the original. Added automatically from request for surgery [id number]   Headache(784.0)    History of adrenal insufficiency due to chronic steroid therapy 01/16/2022   History of chicken pox 10/08/2018   History of COVID-19 02/16/2020   History of CVA (cerebrovascular accident)    History of kidney stones    History of pulmonary embolism 12/10/2018   Lupus Antiphosholipid (+). Patient on secondary prophylaxis with rivaroxaban  20 mg daily long-term.     Hypertension    Hypertrophic cardiomyopathy (HCC) 03/02/2020   Hyponatremia 11/25/2015   Impairment of balance 10/08/2018   Kidney stone 09/14/2022   Legal blindness 01/30/2014   Liver masses 04/13/2022   MRI Abdomen 11/06/22 [facility]: Focus of arterial phase hyperenhancement in the right hepatic lobe  is consistent with a vascular shunt. No evidence of hepatic mass. Cholelithiasis. No radiographic evidence of cholecystitis or biliary  ductal dilatation.      PET Scan 04/10/22 by Pulmonology for work-up of Solitary Pulmonary nodule found Multiple hypermetabolic hepatic mass lesions. Hepatic metastasis    Long-term corticosteroid use 10/08/2018   Lung nodule 05/24/2022   07/05/22 [facility] PET Scan: 1.  Interval decrease in size of a linear left lower lobe pulmonary nodule without radiotracer uptake, favored scarring in the setting of prior infection. No new pulmonary nodule.  2.  Waxing and waning  nonspecific scattered hypermetabolic nodularity throughout both lungs with decreased metabolic activity of bilateral hilar lymph nodes and stable to slightly increased hepat   Lupus    Lupus anticoagulant disorder 10/02/2018   Pulmonary Embolism 2017   Lupus cerebritis (HCC) 11/25/2015   Possible explanation of 11/25/2015 acute hyperintensity findings on Brain MRI.    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   Orthostatic dizziness, Recurrent 10/08/2018   Associated with medications   Otalgia 07/12/2018   Personal history of immunosupression therapy 10/08/2018   Chronic Azathioprine  and prednisone  therapy for Devic's disease   Physical deconditioning 02/16/2020   Pigmented skin lesion of uncertain nature 11/06/2018   Pneumonia 02/21/2020   Pneumonia of right lower lobe due to infectious organism 02/21/2020   Postmenopause 10/21/2020   Pulmonary embolism (HCC) 2017   Shortness of breath    due to medication   Skin lesion of chest wall 11/07/2018   SLE (systemic lupus erythematosus related syndrome) (HCC) 11/07/2018   Smith antibody positive 04/07/2019   Stroke (HCC) 2008   visually impaired   Stroke (HCC) 04/13/2022   Syncope    Transaminasemia 10/21/2020   Ureteral calculi 2018   Vaginal atrophy 09/14/2022   Vaginal odor  06/29/2022   Vitamin D  deficiency 10/06/2018   Past Surgical History:  Procedure Laterality Date   BREAST BIOPSY     BREAST CYST EXCISION Left pt unsure   COLONOSCOPY WITH PROPOFOL  N/A 07/26/2017   Procedure: COLONOSCOPY WITH PROPOFOL ;  Surgeon: Rollin Dover, MD;  Location: [facility];  Service: Endoscopy;  Laterality: N/A;   ESOPHAGOGASTRODUODENOSCOPY (EGD) WITH PROPOFOL  N/A 07/26/2017   Procedure: ESOPHAGOGASTRODUODENOSCOPY (EGD) WITH PROPOFOL ;  Surgeon: Rollin Dover, MD;  Location: [facility];  Service: Endoscopy;  Laterality: N/A;   EUS  01/24/2024   [facility]   PORTA CATH INSERTION     TUBAL LIGATION      TUBAL LIGATION     US  TRANSRECTAL LIMITED N/A 01/24/2024   with biopsy by [facility]   Family History  Problem Relation Age of Onset   Cancer Mother    Cancer Father    Lupus Sister    Social History   Occupational History   Occupation: disability  Tobacco Use   Smoking status: Never   Smokeless tobacco: Never  Vaping Use   Vaping status: Never Used  Substance and Sexual Activity   Alcohol use: No   Drug use: No   Sexual activity: Not Currently    Partners: Male    Birth control/protection: Post-menopausal, Surgical   Tobacco Counseling Counseling given: Not Answered  SDOH Screenings   Food Insecurity: No Food Insecurity (08/17/2024)  Housing: Low Risk  (08/17/2024)  Transportation Needs: No Transportation Needs (08/17/2024)  Utilities: Not At Risk (08/17/2024)  Alcohol Screen: Low Risk  (08/17/2024)  Depression (PHQ2-9): Medium Risk (08/17/2024)  Financial Resource Strain: Low Risk  (08/17/2024)  Physical Activity: Insufficiently Active (08/17/2024)  Social Connections: Moderately Isolated (08/17/2024)  Stress: No Stress Concern Present (08/17/2024)  Tobacco Use: Low Risk  (08/17/2024)  Health Literacy: Adequate Health Literacy (08/17/2024)   See flowsheets for full screening details  Depression Screen Depression Screening Exception Documentation Depression Screening Exception:: Patient refusal  PHQ 2 & 9 Depression Scale- Over the past 2 weeks, how often have you been bothered by any of the following problems? Little interest or pleasure in doing things: 0 Feeling down, depressed, or hopeless (PHQ Adolescent also includes...irritable): 0 PHQ-2 Total Score: 0 Trouble falling or staying asleep, or sleeping too much: 1 (AVGERAGE 3 HOURS, NAPS DURING THE DAY, DUE TO MEDICATION) Feeling tired or having little energy: 1 Poor appetite or overeating (PHQ Adolescent also includes...weight loss): 3 Feeling bad about yourself - or that you are a failure or have let yourself or your  family down: 0 Trouble concentrating on things, such as reading the newspaper or watching television (PHQ Adolescent also includes...like school work): 1 Moving or speaking so slowly that other people could have noticed. Or the opposite - being so fidgety or restless that you have been moving around a lot more than usual: 0 Thoughts that you would be better off dead, or of hurting yourself in some way: 0 PHQ-9 Total Score: 6 If you checked off any problems, how difficult have these problems made it for you to do your work, take care of things at home, or get along with other people?: Not difficult at all  Depression Treatment Depression Interventions/Treatment : Medication     Goals Addressed             This Visit's Progress    08/17/2024: My goal is to feel better and healthier.               Objective:  Today's Vitals   08/17/24 1306  Weight: 156 lb (70.8 kg)  Height: 5' 1 (1.549 m)  PainSc: 0-No pain   Body mass index is 29.48 kg/m.  Hearing/Vision screen Vision Screening - Comments:: Legally Blind. Immunizations and Health Maintenance Health Maintenance  Topic Date Due   Hepatitis B Vaccines 19-59 Average Risk (1 of 3 - 19+ 3-dose series) Never done   COVID-19 Vaccine (7 - 2025-26 season) 05/11/2024   Medicare Annual Wellness (AWV)  08/17/2025   Mammogram  07/30/2026   Cervical Cancer Screening (HPV/Pap Cotest)  02/01/2027   DTaP/Tdap/Td (3 - Td or Tdap) 04/02/2029   Colonoscopy  11/28/2032   Pneumococcal Vaccine: 50+ Years  Completed   Influenza Vaccine  Completed   Hepatitis C Screening  Completed   HIV Screening  Completed   Zoster Vaccines- Shingrix  Completed   HPV VACCINES  Aged Out   Meningococcal B Vaccine  Aged Out        Assessment/Plan:  This is a routine wellness examination for [place].  Patient Care Team: McDiarmid, Krystal BIRCH, MD as PCP - General (Family Medicine) Clarice Perkins, MD as Consulting Physician (Neurology) Benay Kay,  PA-C as Physician Assistant (Physician Assistant) Maree Korene PEDLAR, MD as Consulting Physician (Rheumatology) Levern Hutching, MD as Consulting Physician (Cardiology) Nadeen Prentice Sharper, MD as Consulting Physician (Pulmonary Disease)  I have personally reviewed and noted the following in the patient's chart:   Medical and social history Use of alcohol, tobacco or illicit drugs  Current medications and supplements including opioid prescriptions. Functional ability and status Nutritional status Physical activity Advanced directives List of other physicians Hospitalizations, surgeries, and ER visits in previous 12 months Vitals Screenings to include cognitive, depression, and falls Referrals and appointments  No orders of the defined types were placed in this encounter.  In addition, I have reviewed and discussed with patient certain preventive protocols, quality metrics, and best practice recommendations. A written personalized care plan for preventive services as well as general preventive health recommendations were provided to patient.   Roz LOISE Fuller, LPN   [id number]   Return in 1 year (on 08/17/2025).  After Visit Summary: (MyChart) Due to this being a telephonic visit, the after visit summary with patients personalized plan was offered to patient via MyChart   Nurse Notes: Patient is aware of current care gaps.  No new vaccines.

## 2024-08-17 NOTE — Patient Instructions (Signed)
 Andrea Berry,  Thank you for taking the time for your Medicare Wellness Visit. I appreciate your continued commitment to your health goals. Please review the care plan we discussed, and feel free to reach out if I can assist you further.  Please note that Annual Wellness Visits do not include a physical exam. Some assessments may be limited, especially if the visit was conducted virtually. If needed, we may recommend an in-person follow-up with your provider.  Ongoing Care Seeing your primary care provider every 3 to 6 months helps us  monitor your health and provide consistent, personalized care.   Referrals If a referral was made during today's visit and you haven't received any updates within two weeks, please contact the referred provider directly to check on the status.  Recommended Screenings:  Health Maintenance  Topic Date Due   Hepatitis B Vaccine (1 of 3 - 19+ 3-dose series) Never done   Medicare Annual Wellness Visit  03/28/2024   COVID-19 Vaccine (7 - 2025-26 season) 05/11/2024   Breast Cancer Screening  07/30/2026   Pap with HPV screening  02/01/2027   DTaP/Tdap/Td vaccine (3 - Td or Tdap) 04/02/2029   Colon Cancer Screening  11/28/2032   Pneumococcal Vaccine for age over 25  Completed   Flu Shot  Completed   Hepatitis C Screening  Completed   HIV Screening  Completed   Zoster (Shingles) Vaccine  Completed   HPV Vaccine  Aged Out   Meningitis B Vaccine  Aged Out       08/17/2024    1:08 PM  Advanced Directives  Does Patient Have a Medical Advance Directive? Yes  Type of Advance Directive Living will;Healthcare Power of Attorney  Does patient want to make changes to medical advance directive? No - Patient declined  Copy of Healthcare Power of Attorney in Chart? No - copy requested  Would patient like information on creating a medical advance directive? No - Patient declined    Vision: Annual vision screenings are recommended for early detection of glaucoma,  cataracts, and diabetic retinopathy. These exams can also reveal signs of chronic conditions such as diabetes and high blood pressure.  Dental: Annual dental screenings help detect early signs of oral cancer, gum disease, and other conditions linked to overall health, including heart disease and diabetes.  Please see the attached documents for additional preventive care recommendations.

## 2024-08-21 ENCOUNTER — Telehealth: Payer: Self-pay

## 2024-08-21 NOTE — Telephone Encounter (Signed)
 Patient's caregiver calls nurse line regarding request for nebulizer mask.   She states that patient sometimes has difficulty with using mouth piece and would like mask to use for neb treatments.   Placed mask up at front desk for patient.   Chiquita JAYSON English, RN

## 2024-09-14 ENCOUNTER — Other Ambulatory Visit (HOSPITAL_COMMUNITY): Payer: Self-pay

## 2024-09-15 ENCOUNTER — Ambulatory Visit: Payer: Self-pay | Admitting: Pharmacist

## 2024-09-15 ENCOUNTER — Other Ambulatory Visit: Payer: Self-pay

## 2024-09-15 ENCOUNTER — Other Ambulatory Visit: Payer: Self-pay | Admitting: Family Medicine

## 2024-09-16 ENCOUNTER — Other Ambulatory Visit (HOSPITAL_COMMUNITY): Payer: Self-pay

## 2024-09-16 ENCOUNTER — Other Ambulatory Visit: Payer: Self-pay

## 2024-09-16 MED ORDER — RIVAROXABAN 10 MG PO TABS
10.0000 mg | ORAL_TABLET | Freq: Every day | ORAL | 1 refills | Status: AC
  Filled 2024-09-16: qty 30, 30d supply, fill #0
  Filled 2024-10-14: qty 30, 30d supply, fill #1

## 2024-09-17 ENCOUNTER — Other Ambulatory Visit: Payer: Self-pay

## 2024-09-17 ENCOUNTER — Encounter: Payer: Self-pay | Admitting: Pharmacist

## 2024-09-18 LAB — HEPATITIS B SURFACE ANTIGEN

## 2024-09-21 ENCOUNTER — Other Ambulatory Visit: Payer: Self-pay

## 2024-09-21 ENCOUNTER — Other Ambulatory Visit (HOSPITAL_COMMUNITY): Payer: Self-pay

## 2024-09-21 MED ORDER — AZITHROMYCIN 250 MG PO TABS
250.0000 mg | ORAL_TABLET | Freq: Every day | ORAL | 1 refills | Status: DC
  Filled 2024-09-21 – 2024-09-22 (×2): qty 30, 30d supply, fill #0

## 2024-09-22 ENCOUNTER — Other Ambulatory Visit: Payer: Self-pay

## 2024-09-22 ENCOUNTER — Encounter: Payer: Self-pay | Admitting: Family Medicine

## 2024-09-22 ENCOUNTER — Ambulatory Visit: Payer: Self-pay | Admitting: Family Medicine

## 2024-09-22 VITALS — BP 132/73 | HR 99 | Ht 61.0 in | Wt 160.5 lb

## 2024-09-22 DIAGNOSIS — Z23 Encounter for immunization: Secondary | ICD-10-CM | POA: Diagnosis not present

## 2024-09-22 DIAGNOSIS — R630 Anorexia: Secondary | ICD-10-CM | POA: Diagnosis not present

## 2024-09-22 NOTE — Progress Notes (Unsigned)
" ° ° °  SUBJECTIVE:   CHIEF COMPLAINT / HPI:   Ensure shakes --has poor appetite, eats once/day or every other day --few bites of a sandwich or piece of meat --lots of water, some juice --3 ensure plus chocolate per day --may be related to medication side effect, in contact with pulmonologist about it. Stopped pirfenidone  about 1 month ago, was talking for about 1 year --no other med changes recently --feels that she has lost about 15-20 lbs --weight 156lb 12/8 --nausea over the past 3 months, stable  Andrea Berry is alternative contact  PERTINENT  PMH / PSH: ***  OBJECTIVE:   BP 132/73   Pulse 99   Ht 5' 1 (1.549 m)   Wt 160 lb 8 oz (72.8 kg)   SpO2 92%   BMI 30.33 kg/m   ***  ASSESSMENT/PLAN:   Assessment & Plan Encounter for immunization  Anorexia      Andrea Raring, MD [facility] "

## 2024-09-22 NOTE — Patient Instructions (Signed)
 Thank you for coming in today! Here is a summary of what we discussed:  -Please let us  know if you do not hear anything about the protein shakes in the next 1-2 weeks  Please call the clinic at [phone] if your symptoms worsen or you have any concerns.  Best, Dr Adele

## 2024-09-23 ENCOUNTER — Other Ambulatory Visit: Payer: Self-pay

## 2024-09-24 ENCOUNTER — Other Ambulatory Visit: Payer: Self-pay | Admitting: Family Medicine

## 2024-09-24 ENCOUNTER — Other Ambulatory Visit (HOSPITAL_COMMUNITY): Payer: Self-pay

## 2024-09-25 ENCOUNTER — Other Ambulatory Visit: Payer: Self-pay

## 2024-09-25 ENCOUNTER — Other Ambulatory Visit: Payer: Self-pay | Admitting: Family Medicine

## 2024-09-25 DIAGNOSIS — M329 Systemic lupus erythematosus, unspecified: Secondary | ICD-10-CM

## 2024-09-25 DIAGNOSIS — Z7952 Long term (current) use of systemic steroids: Secondary | ICD-10-CM

## 2024-09-25 MED ORDER — PREDNISONE 5 MG PO TABS
5.0000 mg | ORAL_TABLET | Freq: Every day | ORAL | 1 refills | Status: AC
  Filled 2024-09-25 (×2): qty 90, 90d supply, fill #0

## 2024-09-25 NOTE — Progress Notes (Signed)
 Refilled Andrea Taha' basal prednisone  5 mg daily for SLE with lung involvement. I have agreed to assume the prescribing of Andrea Hefner' basal prednisone  5 mg daily.

## 2024-09-28 ENCOUNTER — Other Ambulatory Visit: Payer: Self-pay

## 2024-09-30 ENCOUNTER — Encounter: Payer: Self-pay | Admitting: Pharmacist

## 2024-09-30 ENCOUNTER — Other Ambulatory Visit: Payer: Self-pay

## 2024-09-30 NOTE — Progress Notes (Signed)
 This patient is appearing on a report for being at risk of failing the adherence measure for cholesterol (statin) medications this calendar year.   Medication: atorvastatin  Last fill date: 09/17/24 for 90 day supply  Reviewed medication indication, dosing, and goals of therapy.

## 2024-10-06 ENCOUNTER — Encounter: Payer: Self-pay | Admitting: Family Medicine

## 2024-10-07 ENCOUNTER — Telehealth: Payer: Self-pay

## 2024-10-07 NOTE — Telephone Encounter (Signed)
 April calls nurse line in regards to patients breathing.   She reports over the weekend she was having increased shortness of breath. She reports she was having to use her breathing treatments multiple times per day. April reports she encouraged her to go to the ED, however she declined.   She reports she is doing much better today when she spoke with her. She was speaking in full sentences. No distress.   April reports they have tried reaching out to Dr. Nadeen for about 1 month, however have not received a return call.   She reports in December she was taken off of pirfenidone  due to side effects. She reports she was told lab work needed to be done prior to changing medication. She reports labs were drawn, however they have not been able to speak with him in regards to an alternative medication.   Apt offered for an evaluation. However, she declined due to the weather.   She asks if PCP could reach out to Dr. Nadeen if possible. Advised will forward to PCP.   Evaluation encouraged for increased shortness of breath.

## 2024-10-14 ENCOUNTER — Other Ambulatory Visit: Payer: Self-pay

## 2024-10-14 ENCOUNTER — Other Ambulatory Visit: Payer: Self-pay | Admitting: Family Medicine

## 2024-10-14 DIAGNOSIS — Z789 Other specified health status: Secondary | ICD-10-CM | POA: Insufficient documentation

## 2024-10-14 NOTE — Telephone Encounter (Signed)
 Contacted Ms.April she stated that [facility] called this morning and that they have the patient set up to have some imaging done.

## 2024-10-14 NOTE — Telephone Encounter (Signed)
 Please let Andrea Andrea Berry, the niece who helps Andrea Berry navigate the health system, know I sent a message to Dr Nadeen at [facility], asking him Andrea Pytel' question about changing to a new medication.   Please let Andrea Berry know if they do not receive a response from Dr Nadeen within five business day.

## 2024-10-15 ENCOUNTER — Other Ambulatory Visit: Payer: Self-pay

## 2024-10-16 ENCOUNTER — Other Ambulatory Visit: Payer: Self-pay
